# Patient Record
Sex: Male | Born: 1975 | Race: White | Hispanic: No | Marital: Single | Smoking: Former smoker
Health system: Southern US, Community
[De-identification: ages and names within clinical notes are randomized; demographics above are authoritative.]

## PROBLEM LIST (undated history)

## (undated) DIAGNOSIS — C169 Malignant neoplasm of stomach, unspecified: Secondary | ICD-10-CM

---

## 1998-12-06 ENCOUNTER — Emergency Department (HOSPITAL_COMMUNITY): Admission: EM | Admit: 1998-12-06 | Discharge: 1998-12-06 | Payer: Self-pay | Admitting: Emergency Medicine

## 1998-12-06 ENCOUNTER — Encounter: Payer: Self-pay | Admitting: Emergency Medicine

## 1999-04-06 ENCOUNTER — Emergency Department (HOSPITAL_COMMUNITY): Admission: EM | Admit: 1999-04-06 | Discharge: 1999-04-06 | Payer: Self-pay | Admitting: Emergency Medicine

## 2003-05-23 ENCOUNTER — Emergency Department (HOSPITAL_COMMUNITY): Admission: AD | Admit: 2003-05-23 | Discharge: 2003-05-23 | Payer: Self-pay | Admitting: Family Medicine

## 2004-12-21 ENCOUNTER — Emergency Department (HOSPITAL_COMMUNITY): Admission: EM | Admit: 2004-12-21 | Discharge: 2004-12-21 | Payer: Self-pay | Admitting: Emergency Medicine

## 2005-01-25 ENCOUNTER — Emergency Department (HOSPITAL_COMMUNITY): Admission: EM | Admit: 2005-01-25 | Discharge: 2005-01-25 | Payer: Self-pay | Admitting: Family Medicine

## 2005-04-06 ENCOUNTER — Emergency Department (HOSPITAL_COMMUNITY): Admission: EM | Admit: 2005-04-06 | Discharge: 2005-04-06 | Payer: Self-pay | Admitting: Emergency Medicine

## 2005-04-28 ENCOUNTER — Emergency Department (HOSPITAL_COMMUNITY): Admission: EM | Admit: 2005-04-28 | Discharge: 2005-04-28 | Payer: Self-pay | Admitting: Family Medicine

## 2005-11-30 ENCOUNTER — Emergency Department (HOSPITAL_COMMUNITY): Admission: EM | Admit: 2005-11-30 | Discharge: 2005-11-30 | Payer: Self-pay | Admitting: Emergency Medicine

## 2008-12-16 ENCOUNTER — Ambulatory Visit: Payer: Self-pay | Admitting: Family Medicine

## 2010-02-14 ENCOUNTER — Emergency Department (HOSPITAL_COMMUNITY): Admission: EM | Admit: 2010-02-14 | Discharge: 2010-02-14 | Payer: Self-pay | Admitting: Emergency Medicine

## 2010-04-02 ENCOUNTER — Emergency Department (HOSPITAL_COMMUNITY): Admission: EM | Admit: 2010-04-02 | Discharge: 2010-04-02 | Payer: Self-pay | Admitting: Family Medicine

## 2010-08-05 ENCOUNTER — Inpatient Hospital Stay (INDEPENDENT_AMBULATORY_CARE_PROVIDER_SITE_OTHER)
Admission: RE | Admit: 2010-08-05 | Discharge: 2010-08-05 | Disposition: A | Discharge status: Home / Self Care | Payer: Self-pay | Source: Ambulatory Visit | Attending: Family Medicine | Admitting: Family Medicine

## 2010-08-05 DIAGNOSIS — J45909 Unspecified asthma, uncomplicated: Secondary | ICD-10-CM

## 2010-09-26 ENCOUNTER — Inpatient Hospital Stay (INDEPENDENT_AMBULATORY_CARE_PROVIDER_SITE_OTHER)
Admission: RE | Admit: 2010-09-26 | Discharge: 2010-09-26 | Disposition: A | Discharge status: Home / Self Care | Payer: Self-pay | Source: Ambulatory Visit | Attending: Family Medicine | Admitting: Family Medicine

## 2010-09-26 DIAGNOSIS — J309 Allergic rhinitis, unspecified: Secondary | ICD-10-CM

## 2010-09-26 LAB — POCT RAPID STREP A (OFFICE): Streptococcus, Group A Screen (Direct): NEGATIVE

## 2011-03-01 ENCOUNTER — Emergency Department (HOSPITAL_COMMUNITY)
Admission: EM | Admit: 2011-03-01 | Discharge: 2011-03-01 | Disposition: A | Discharge status: Home / Self Care | Payer: Self-pay | Attending: Emergency Medicine | Admitting: Emergency Medicine

## 2011-03-01 DIAGNOSIS — R05 Cough: Secondary | ICD-10-CM | POA: Insufficient documentation

## 2011-03-01 DIAGNOSIS — R0602 Shortness of breath: Secondary | ICD-10-CM | POA: Insufficient documentation

## 2011-03-01 DIAGNOSIS — R059 Cough, unspecified: Secondary | ICD-10-CM | POA: Insufficient documentation

## 2011-03-01 DIAGNOSIS — F17200 Nicotine dependence, unspecified, uncomplicated: Secondary | ICD-10-CM | POA: Insufficient documentation

## 2011-03-01 DIAGNOSIS — J45909 Unspecified asthma, uncomplicated: Secondary | ICD-10-CM | POA: Insufficient documentation

## 2011-06-25 ENCOUNTER — Emergency Department (INDEPENDENT_AMBULATORY_CARE_PROVIDER_SITE_OTHER)
Admission: EM | Admit: 2011-06-25 | Discharge: 2011-06-25 | Disposition: A | Discharge status: Home / Self Care | Payer: Self-pay | Source: Home / Self Care | Attending: Family Medicine | Admitting: Family Medicine

## 2011-06-25 ENCOUNTER — Encounter: Payer: Self-pay | Admitting: Emergency Medicine

## 2011-06-25 DIAGNOSIS — J45901 Unspecified asthma with (acute) exacerbation: Secondary | ICD-10-CM

## 2011-06-25 MED ORDER — PREDNISONE 20 MG PO TABS: 40.0000 mg | ORAL_TABLET | Freq: Every day | ORAL | Status: AC

## 2011-06-25 MED ORDER — ALBUTEROL SULFATE (5 MG/ML) 0.5% IN NEBU
INHALATION_SOLUTION | RESPIRATORY_TRACT | Status: AC
  Filled 2011-06-25: qty 1

## 2011-06-25 MED ORDER — IPRATROPIUM BROMIDE 0.02 % IN SOLN
0.5000 mg | Freq: Once | RESPIRATORY_TRACT | Status: AC
  Administered 2011-06-25: 0.5 mg via RESPIRATORY_TRACT

## 2011-06-25 MED ORDER — ALBUTEROL SULFATE (5 MG/ML) 0.5% IN NEBU
2.5000 mg | INHALATION_SOLUTION | Freq: Once | RESPIRATORY_TRACT | Status: AC
  Administered 2011-06-25: 2.5 mg via RESPIRATORY_TRACT

## 2011-06-25 MED ORDER — PREDNISONE 20 MG PO TABS
ORAL_TABLET | ORAL | Status: AC
  Filled 2011-06-25: qty 2

## 2011-06-25 MED ORDER — ALBUTEROL SULFATE HFA 108 (90 BASE) MCG/ACT IN AERS
2.0000 | INHALATION_SPRAY | RESPIRATORY_TRACT | Status: DC | PRN
  Administered 2011-06-25 (×2): 2 via RESPIRATORY_TRACT

## 2011-06-25 MED ORDER — PREDNISONE 20 MG PO TABS
40.0000 mg | ORAL_TABLET | Freq: Once | ORAL | Status: AC
  Administered 2011-06-25: 40 mg via ORAL

## 2011-06-25 MED ORDER — ALBUTEROL SULFATE (5 MG/ML) 0.5% IN NEBU
INHALATION_SOLUTION | RESPIRATORY_TRACT | Status: AC
  Filled 2011-06-25: qty 0.5

## 2011-06-25 NOTE — ED Provider Notes (Signed)
History     CSN: [id number]  Arrival date & time 06/25/11  6213   First MD Initiated Contact with Patient 06/25/11 1033      Chief Complaint  Patient presents with  . Asthma    (Consider location/radiation/quality/duration/timing/severity/associated sxs/prior treatment) HPI Comments: Jesus Haynes presents for evaluation of shortness of breath and wheezing since last night. He reports a hx of asthma but his inhaler was empty. He also reports a recent sinus infection and congestion. He denies any fever. He reports non-productive cough. He also reports that their recent house, from which they moved, had a mold problem.   Patient is a 35 y.o. male presenting with shortness of breath. The history is provided by the patient.  Shortness of Breath  The current episode started yesterday. The onset was sudden. The problem has been unchanged. The problem is moderate. The symptoms are relieved by nothing. The symptoms are aggravated by nothing. Associated symptoms include rhinorrhea, cough, shortness of breath and wheezing. Pertinent negatives include no fever. His past medical history is significant for asthma.    Past Medical History  Diagnosis Date  . Asthma     History reviewed. No pertinent past surgical history.  No family history on file.  History  Substance Use Topics  . Smoking status: Current Everyday Smoker  . Smokeless tobacco: Not on file  . Alcohol Use: No      Review of Systems  Constitutional: Negative for fever and chills.  HENT: Positive for congestion and rhinorrhea.   Eyes: Negative.   Respiratory: Positive for cough, shortness of breath and wheezing.   Gastrointestinal: Negative.   Genitourinary: Negative.   Musculoskeletal: Negative.   Skin: Negative.   Neurological: Negative.     Allergies  Review of patient's allergies indicates no known allergies.  Home Medications   Current Outpatient Rx  Name Route Sig Dispense Refill  . ALBUTEROL SULFATE HFA 108  (90 BASE) MCG/ACT IN AERS Inhalation Inhale 2 puffs into the lungs every 6 (six) hours as needed.      Marland Kitchen PREDNISONE 20 MG PO TABS Oral Take 2 tablets (40 mg total) by mouth daily. 10 tablet 0    BP 128/69  Pulse 90  Temp(Src) 98.3 F (36.8 C) (Oral)  Resp 26  SpO2 92%  Physical Exam  Nursing note and vitals reviewed. Constitutional: He is oriented to person, place, and time. He appears well-developed and well-nourished.  HENT:  Head: Normocephalic and atraumatic.  Eyes: EOM are normal.  Neck: Normal range of motion.  Pulmonary/Chest: Effort normal. He has wheezes in the right upper field, the right middle field, the right lower field, the left upper field, the left middle field and the left lower field.  Musculoskeletal: Normal range of motion.  Neurological: He is alert and oriented to person, place, and time.  Skin: Skin is warm and dry.  Psychiatric: His behavior is normal.    ED Course  Procedures (including critical care time)  Labs Reviewed - No data to display No results found.   1. Asthma exacerbation       MDM  Improved breathing with decreased wheezing and cough after administration of 2 duonebs; also given prednisone 40 mg PO        Richardo Priest, MD 06/25/11 1134

## 2011-06-25 NOTE — ED Notes (Signed)
PT BROUGHT BACK TO ROOM  7 FOR SUDDEN ASTHMA ATTACK THAT STARTED LAST NIGHT WITH SINUS INFECTION.PT USED ALBUTEROL INHALER BUT IT WAS CLOSE TO BEING EMPTY.PT ALSO REPORT OF LINVING IN HOUSE WITH MOLD IN WHICH THEY MOVED OUT FOR BUT [facility] LIVED THERE PRIOR 2 YRS.WHEEZING THROUGHOUT,SOB WITH SPEAKING AND RESP RATE 36.TREATMENT GIVEN PER PROTOCOL.NO DISTRESS AT THIS TIME

## 2011-07-01 NOTE — ED Notes (Signed)
Pt here requesting albuterol inhaler prescription was given inhaler to take home with him was told would be given a prescription - per dr Lorenz Coaster prescription called to [facility] on [address] - albuterol inhaler 2 puffs every 4 hours asneeded 0 refills

## 2011-09-13 ENCOUNTER — Emergency Department (HOSPITAL_COMMUNITY)
Admission: EM | Admit: 2011-09-13 | Discharge: 2011-09-13 | Disposition: A | Discharge status: Home / Self Care | Payer: Self-pay | Source: Home / Self Care

## 2011-09-13 ENCOUNTER — Encounter (HOSPITAL_COMMUNITY): Payer: Self-pay | Admitting: *Deleted

## 2011-09-13 DIAGNOSIS — L309 Dermatitis, unspecified: Secondary | ICD-10-CM

## 2011-09-13 DIAGNOSIS — L259 Unspecified contact dermatitis, unspecified cause: Secondary | ICD-10-CM

## 2011-09-13 DIAGNOSIS — J45909 Unspecified asthma, uncomplicated: Secondary | ICD-10-CM

## 2011-09-13 MED ORDER — ALBUTEROL SULFATE HFA 108 (90 BASE) MCG/ACT IN AERS: 2.0000 | INHALATION_SPRAY | RESPIRATORY_TRACT | Status: DC | PRN

## 2011-09-13 MED ORDER — IPRATROPIUM BROMIDE 0.02 % IN SOLN
0.5000 mg | Freq: Once | RESPIRATORY_TRACT | Status: AC
  Administered 2011-09-13: 0.5 mg via RESPIRATORY_TRACT

## 2011-09-13 MED ORDER — ALBUTEROL SULFATE (5 MG/ML) 0.5% IN NEBU
INHALATION_SOLUTION | RESPIRATORY_TRACT | Status: AC
  Filled 2011-09-13: qty 1

## 2011-09-13 MED ORDER — TRIAMCINOLONE ACETONIDE 0.1 % EX CREA: TOPICAL_CREAM | Freq: Two times a day (BID) | CUTANEOUS | Status: AC

## 2011-09-13 MED ORDER — ALBUTEROL SULFATE (5 MG/ML) 0.5% IN NEBU
5.0000 mg | INHALATION_SOLUTION | Freq: Once | RESPIRATORY_TRACT | Status: AC
  Administered 2011-09-13: 5 mg via RESPIRATORY_TRACT

## 2011-09-13 NOTE — Discharge Instructions (Signed)
Stop smoking! I have provided you with the contact information for the [facility] and the [facility]. They accept Medicaid if they are accepting new patients. Return as needed.

## 2011-09-13 NOTE — ED Notes (Signed)
Pt  Reports    He  Has  Asthma    Out of  meds   No  pcp   Continues  To  Smoke          Reports  Cough  Congested  And        Short of  Breath       Sitting  Upright on  Exam table  Speaking in  Complete  sentances

## 2011-09-13 NOTE — ED Provider Notes (Signed)
History     CSN: [id number]  Arrival date & time 09/13/11  1038   None     Chief Complaint  Patient presents with  . Asthma    (Consider location/radiation/quality/duration/timing/severity/associated sxs/prior treatment) HPI Comments: Patient presents today with complaints of asthma. He states he has a history of asthma since childhood. It did not bother him for many years of his adult life but he has been symptomatic for the last 1-1/2 years. He has had intermittent wheezing and cough for the last year and a half, no change or worsening. He states his symptoms are worse in the morning when he first awakens. His cough is sometimes productive and his symptoms sometimes improve once he clears the phlegm out of his chest and throat. He is a smoker. He denies fever, chills, nasal congestion, sore throat or sinus pressure. He also has a history of eczema. He has used triamcinolone cream in the past requesting refill of this also. Patient states that he recently obtained Medicaid insurance and has been unable to find a primary care provider who accepts this insurance.   Past Medical History  Diagnosis Date  . Asthma     History reviewed. No pertinent past surgical history.  History reviewed. No pertinent family history.  History  Substance Use Topics  . Smoking status: Current Everyday Smoker  . Smokeless tobacco: Not on file  . Alcohol Use: No      Review of Systems  Constitutional: Negative for fever and chills.  HENT: Negative for ear pain, congestion, sore throat, rhinorrhea and sinus pressure.   Respiratory: Positive for cough, shortness of breath and wheezing.   Cardiovascular: Negative for chest pain.  Skin: Positive for rash.    Allergies  Review of patient's allergies indicates no known allergies.  Home Medications   Current Outpatient Rx  Name Route Sig Dispense Refill  . ALBUTEROL SULFATE HFA 108 (90 BASE) MCG/ACT IN AERS Inhalation Inhale 2 puffs into the  lungs every 4 (four) hours as needed. 1 Inhaler 0  . TRIAMCINOLONE ACETONIDE 0.1 % EX CREA Topical Apply topically 2 (two) times daily. 15 g 0    BP 122/77  Pulse 85  Temp(Src) 98.2 F (36.8 C) (Oral)  Resp 16  SpO2 96%  Physical Exam  Nursing note and vitals reviewed. Constitutional: He appears well-developed and well-nourished. No distress.  HENT:  Head: Normocephalic and atraumatic.  Right Ear: Tympanic membrane, external ear and ear canal normal.  Left Ear: Tympanic membrane, external ear and ear canal normal.  Nose: Nose normal.  Mouth/Throat: Uvula is midline, oropharynx is clear and moist and mucous membranes are normal. No oropharyngeal exudate, posterior oropharyngeal edema or posterior oropharyngeal erythema.  Neck: Neck supple.  Cardiovascular: Normal rate, regular rhythm and normal heart sounds.   Pulmonary/Chest: Effort normal and breath sounds normal. No respiratory distress.  Lymphadenopathy:    He has no cervical adenopathy.  Neurological: He is alert.  Skin: Skin is warm and dry. Rash (scaling patches noted bilateral hands) noted.  Psychiatric: He has a normal mood and affect.    ED Course  Procedures (including critical care time)  Labs Reviewed - No data to display No results found.   1. Asthma   2. Eczema       MDM          Melody Comas, PA 09/13/11 1400

## 2011-09-14 NOTE — ED Provider Notes (Signed)
Medical screening examination/treatment/procedure(s) were performed by non-physician practitioner and as supervising physician I was immediately available for consultation/collaboration.  Bijan Ridgley   Jakyria Bleau, MD 09/14/11 0750 

## 2013-01-21 ENCOUNTER — Emergency Department (INDEPENDENT_AMBULATORY_CARE_PROVIDER_SITE_OTHER)
Admission: EM | Admit: 2013-01-21 | Discharge: 2013-01-21 | Disposition: A | Discharge status: Home / Self Care | Payer: Self-pay | Source: Home / Self Care | Attending: Emergency Medicine | Admitting: Emergency Medicine

## 2013-01-21 ENCOUNTER — Encounter (HOSPITAL_COMMUNITY): Payer: Self-pay

## 2013-01-21 DIAGNOSIS — H109 Unspecified conjunctivitis: Secondary | ICD-10-CM

## 2013-01-21 DIAGNOSIS — S0501XA Injury of conjunctiva and corneal abrasion without foreign body, right eye, initial encounter: Secondary | ICD-10-CM

## 2013-01-21 DIAGNOSIS — S058X9A Other injuries of unspecified eye and orbit, initial encounter: Secondary | ICD-10-CM

## 2013-01-21 MED ORDER — TETRACAINE HCL 0.5 % OP SOLN
OPHTHALMIC | Status: AC
  Filled 2013-01-21: qty 2

## 2013-01-21 MED ORDER — POLYMYXIN B-TRIMETHOPRIM 10000-0.1 UNIT/ML-% OP SOLN: 1.0000 [drp] | OPHTHALMIC | Status: DC

## 2013-01-21 MED ORDER — TETRACAINE HCL 0.5 % OP SOLN
1.0000 [drp] | Freq: Once | OPHTHALMIC | Status: AC
  Administered 2013-01-21: 1 [drp] via OPHTHALMIC

## 2013-01-21 NOTE — ED Provider Notes (Signed)
   History    CSN: [id number] Arrival date & time 01/21/13  4098  None    Chief Complaint  Patient presents with  . Eye Problem   (Consider location/radiation/quality/duration/timing/severity/associated sxs/prior Treatment) HPI Comments: 37 year old male who states that 4 days ago his eyes began to feel irritated. He had been working in a dusty environment. In the ensuing 4 days his eyes became red with swelling of the upper and lower lids, puffiness and drainage. Gradual onset.  Past Medical History  Diagnosis Date  . Asthma    History reviewed. No pertinent past surgical history. History reviewed. No pertinent family history. History  Substance Use Topics  . Smoking status: Current Every Day Smoker  . Smokeless tobacco: Not on file  . Alcohol Use: No    Review of Systems  Constitutional: Negative.   HENT: Negative.   Eyes: Positive for discharge, redness and itching.  Respiratory: Negative.   Genitourinary: Negative.   Neurological: Negative.     Allergies  Review of patient's allergies indicates no known allergies.  Home Medications   Current Outpatient Rx  Name  Route  Sig  Dispense  Refill  . albuterol (PROVENTIL HFA;VENTOLIN HFA) 108 (90 BASE) MCG/ACT inhaler   Inhalation   Inhale 2 puffs into the lungs every 4 (four) hours as needed.   1 Inhaler   0   . trimethoprim-polymyxin b (POLYTRIM) ophthalmic solution   Both Eyes   Place 1 drop into both eyes every 4 (four) hours.   10 mL   0    BP 134/82  Pulse 71  Temp(Src) 97.8 F (36.6 C) (Oral)  Resp 17  SpO2 98% Physical Exam  Nursing note and vitals reviewed. Constitutional: He is oriented to person, place, and time. He appears well-developed and well-nourished. No distress.  HENT:  Mouth/Throat: Oropharynx is clear and moist.  Eyes: EOM are normal. Pupils are equal, round, and reactive to light.  Bilateral conjunctival erythema upper and lower lids. Mild puffiness of the lower lids. The  bilateral sclera injected. Date mucoid stringy discharge from each eye.  Neck: Normal range of motion. Neck supple.  Musculoskeletal: He exhibits no edema.  Neurological: He is alert and oriented to person, place, and time. He exhibits normal muscle tone.  Skin: Skin is warm and dry.  Psychiatric: He has a normal mood and affect.    ED Course  Irrigation Date/Time: 01/21/2013 10:50 AM Performed by: Phineas Real Rasheen Schewe Authorized by: Phineas Real, Deberah Adolf Consent: Verbal consent obtained. Risks and benefits: risks, benefits and alternatives were discussed Consent given by: patient Patient understanding: patient states understanding of the procedure being performed Patient identity confirmed: verbally with patient Local anesthesia used: yes Anesthesia: local infiltration Local anesthetic: topical anesthetic Anesthetic total: 0.3 ml Patient sedated: no Comments: Bilateral eyes irrigated with eye wash post fluro stain, Small speck debris removed.    (including critical care time) Labs Reviewed - No data to display No results found. 1. Corneal abrasion, bilateral, initial encounter   2. Conjunctivitis of both eyes     MDM  Polytrim as directed Stay out of dusty type environments for 3-4 days, Wear eye protection. F/U with the above on call eye Doc  Hayden Rasmussen, NP 01/21/13 1303

## 2013-01-21 NOTE — ED Provider Notes (Signed)
Medical screening examination/treatment/procedure(s) were performed by non-physician practitioner and as supervising physician I was immediately available for consultation/collaboration.  Leslee Home, M.D.   Reuben Likes, MD 01/21/13 [phone]

## 2013-01-21 NOTE — ED Notes (Signed)
C/o both eyes irritated, R>L x 2 days; NAD

## 2013-07-28 ENCOUNTER — Emergency Department (INDEPENDENT_AMBULATORY_CARE_PROVIDER_SITE_OTHER): Payer: Self-pay

## 2013-07-28 ENCOUNTER — Encounter (HOSPITAL_COMMUNITY): Payer: Self-pay | Admitting: Emergency Medicine

## 2013-07-28 ENCOUNTER — Emergency Department (INDEPENDENT_AMBULATORY_CARE_PROVIDER_SITE_OTHER)
Admission: EM | Admit: 2013-07-28 | Discharge: 2013-07-28 | Disposition: A | Discharge status: Home / Self Care | Payer: Self-pay | Source: Home / Self Care | Attending: Family Medicine | Admitting: Family Medicine

## 2013-07-28 DIAGNOSIS — J45909 Unspecified asthma, uncomplicated: Secondary | ICD-10-CM

## 2013-07-28 MED ORDER — IPRATROPIUM BROMIDE 0.02 % IN SOLN
0.5000 mg | Freq: Once | RESPIRATORY_TRACT | Status: AC
  Administered 2013-07-28: 0.5 mg via RESPIRATORY_TRACT

## 2013-07-28 MED ORDER — METHYLPREDNISOLONE 4 MG PO KIT: PACK | ORAL | Status: DC

## 2013-07-28 MED ORDER — LEVOFLOXACIN 500 MG PO TABS: 500.0000 mg | ORAL_TABLET | Freq: Every day | ORAL | Status: DC

## 2013-07-28 MED ORDER — METHYLPREDNISOLONE SODIUM SUCC 125 MG IJ SOLR
INTRAMUSCULAR | Status: AC
  Filled 2013-07-28: qty 2

## 2013-07-28 MED ORDER — METHYLPREDNISOLONE SODIUM SUCC 125 MG IJ SOLR
125.0000 mg | Freq: Once | INTRAMUSCULAR | Status: AC
  Administered 2013-07-28: 125 mg via INTRAMUSCULAR

## 2013-07-28 MED ORDER — ALBUTEROL SULFATE HFA 108 (90 BASE) MCG/ACT IN AERS: 1.0000 | INHALATION_SPRAY | Freq: Four times a day (QID) | RESPIRATORY_TRACT | Status: DC | PRN

## 2013-07-28 MED ORDER — ALBUTEROL SULFATE (2.5 MG/3ML) 0.083% IN NEBU
5.0000 mg | INHALATION_SOLUTION | Freq: Once | RESPIRATORY_TRACT | Status: AC
  Administered 2013-07-28: 5 mg via RESPIRATORY_TRACT

## 2013-07-28 MED ORDER — IPRATROPIUM BROMIDE 0.02 % IN SOLN
RESPIRATORY_TRACT | Status: AC
  Filled 2013-07-28: qty 2.5

## 2013-07-28 MED ORDER — ALBUTEROL SULFATE (2.5 MG/3ML) 0.083% IN NEBU
INHALATION_SOLUTION | RESPIRATORY_TRACT | Status: AC
  Filled 2013-07-28: qty 6

## 2013-07-28 NOTE — ED Notes (Signed)
Pt c/o cold sxs onset 2 weeks Sxs include: wheezing, SOB, scratchy throat, congestion Hx of asthma... Reports he quit smoking about 7 months ago Denies f/v/n/d He is alert w/no signs of acute distress.

## 2013-07-28 NOTE — ED Provider Notes (Signed)
CSN: [id number]     Arrival date & time 07/28/13  1011 History   First MD Initiated Contact with Patient 07/28/13 1043     Chief Complaint  Patient presents with  . URI   (Consider location/radiation/quality/duration/timing/severity/associated sxs/prior Treatment) Patient is a 38 y.o. male presenting with URI. The history is provided by the patient.  URI Presenting symptoms: congestion, cough and rhinorrhea   Presenting symptoms: no fever   Severity:  Moderate Onset quality:  Gradual Duration:  2 weeks Chronicity:  New Ineffective treatments:  Inhaler Associated symptoms: wheezing   Risk factors comment:  Quit smoking 7 mo ago.   Past Medical History  Diagnosis Date  . Asthma    History reviewed. No pertinent past surgical history. No family history on file. History  Substance Use Topics  . Smoking status: Former Smoker -- 0.50 packs/day    Types: Cigarettes    Quit date: 12/26/2012  . Smokeless tobacco: Not on file  . Alcohol Use: No    Review of Systems  Constitutional: Negative.  Negative for fever.  HENT: Positive for congestion and rhinorrhea.   Respiratory: Positive for cough and wheezing.   Gastrointestinal: Negative.     Allergies  Review of patient's allergies indicates no known allergies.  Home Medications   Current Outpatient Rx  Name  Route  Sig  Dispense  Refill  . albuterol (PROVENTIL HFA;VENTOLIN HFA) 108 (90 BASE) MCG/ACT inhaler   Inhalation   Inhale 2 puffs into the lungs every 4 (four) hours as needed.   1 Inhaler   0   . albuterol (PROVENTIL HFA;VENTOLIN HFA) 108 (90 BASE) MCG/ACT inhaler   Inhalation   Inhale 1-2 puffs into the lungs every 6 (six) hours as needed for wheezing or shortness of breath.   1 Inhaler   0   . levofloxacin (LEVAQUIN) 500 MG tablet   Oral   Take 1 tablet (500 mg total) by mouth daily.   7 tablet   0   . methylPREDNISolone (MEDROL DOSEPAK) 4 MG tablet      follow package directions   21 tablet   0    . trimethoprim-polymyxin b (POLYTRIM) ophthalmic solution   Both Eyes   Place 1 drop into both eyes every 4 (four) hours.   10 mL   0    BP 134/73  Pulse 80  Temp(Src) 98 F (36.7 C) (Oral)  Resp 14  SpO2 98% Physical Exam  Nursing note and vitals reviewed. Constitutional: He is oriented to person, place, and time. He appears well-developed and well-nourished.  HENT:  Head: Normocephalic.  Right Ear: External ear normal.  Left Ear: External ear normal.  Mouth/Throat: Oropharynx is clear and moist.  Eyes: Pupils are equal, round, and reactive to light.  Neck: Normal range of motion. Neck supple.  Cardiovascular: Normal rate, regular rhythm, normal heart sounds and intact distal pulses.   Pulmonary/Chest: Effort normal. He has wheezes. He has rhonchi.  Abdominal: Soft. Bowel sounds are normal.  Lymphadenopathy:    He has no cervical adenopathy.  Neurological: He is alert and oriented to person, place, and time.  Skin: Skin is warm and dry.    ED Course  Procedures (including critical care time) Labs Review Labs Reviewed - No data to display Imaging Review No results found.    MDM  X-rays reviewed and report per radiologist.     Linna HoffJames D Audriana Aldama, MD 07/30/13 2035

## 2013-07-28 NOTE — Discharge Instructions (Signed)
Take all of medicine, drink lots of fluids, no more smoking, see your doctor if further problems  °

## 2014-06-21 ENCOUNTER — Emergency Department (HOSPITAL_COMMUNITY)
Admission: EM | Admit: 2014-06-21 | Discharge: 2014-06-21 | Disposition: A | Discharge status: Home / Self Care | Payer: Medicaid Other | Attending: Emergency Medicine | Admitting: Emergency Medicine

## 2014-06-21 ENCOUNTER — Encounter (HOSPITAL_COMMUNITY): Payer: Self-pay | Admitting: *Deleted

## 2014-06-21 ENCOUNTER — Emergency Department (HOSPITAL_COMMUNITY): Payer: Medicaid Other

## 2014-06-21 DIAGNOSIS — J069 Acute upper respiratory infection, unspecified: Secondary | ICD-10-CM | POA: Diagnosis not present

## 2014-06-21 DIAGNOSIS — R05 Cough: Secondary | ICD-10-CM

## 2014-06-21 DIAGNOSIS — R059 Cough, unspecified: Secondary | ICD-10-CM

## 2014-06-21 DIAGNOSIS — Z87891 Personal history of nicotine dependence: Secondary | ICD-10-CM | POA: Diagnosis not present

## 2014-06-21 DIAGNOSIS — Z792 Long term (current) use of antibiotics: Secondary | ICD-10-CM | POA: Diagnosis not present

## 2014-06-21 DIAGNOSIS — Z79899 Other long term (current) drug therapy: Secondary | ICD-10-CM | POA: Insufficient documentation

## 2014-06-21 DIAGNOSIS — J45901 Unspecified asthma with (acute) exacerbation: Secondary | ICD-10-CM | POA: Insufficient documentation

## 2014-06-21 MED ORDER — PREDNISONE 20 MG PO TABS
60.0000 mg | ORAL_TABLET | Freq: Once | ORAL | Status: AC
  Administered 2014-06-21: 60 mg via ORAL
  Filled 2014-06-21: qty 3

## 2014-06-21 MED ORDER — ALBUTEROL SULFATE HFA 108 (90 BASE) MCG/ACT IN AERS
2.0000 | INHALATION_SPRAY | Freq: Once | RESPIRATORY_TRACT | Status: AC
Start: 2014-06-21 — End: 2014-06-21
  Administered 2014-06-21: 2 via RESPIRATORY_TRACT
  Filled 2014-06-21: qty 6.7

## 2014-06-21 MED ORDER — PREDNISONE 20 MG PO TABS: 40.0000 mg | ORAL_TABLET | Freq: Every day | ORAL | Status: DC

## 2014-06-21 MED ORDER — HYDROCODONE-HOMATROPINE 5-1.5 MG/5ML PO SYRP: 5.0000 mL | ORAL_SOLUTION | Freq: Four times a day (QID) | ORAL | Status: DC | PRN

## 2014-06-21 NOTE — Discharge Instructions (Signed)
Take Prednisone as directed until gone. Take hycodan as needed for cough. Refer to attached documents for more information. Return to the ED with worsening or concerning symptoms.

## 2014-06-21 NOTE — ED Provider Notes (Signed)
CSN: [id number]     Arrival date & time 06/21/14  1016 History   This chart was scribed for non-physician practitioner, Emilia BeckKaitlyn Estella Malatesta, PA-C, working with [name] PayorPickering, MD by Freida Busmaniana Omoyeni, ED Scribe. This patient was seen in room TR05C/TR05C and the patient's care was started at 11:50 AM.    Chief Complaint  Patient presents with  . Cough  . URI     Patient is a 38 y.o. male presenting with cough. The history is provided by the patient. No language interpreter was used.  Cough Cough characteristics:  Dry Duration:  1 week Timing:  Constant Progression:  Unchanged Chronicity:  New Ineffective treatments:  Steroid inhaler Associated symptoms: no chills, no fever and no rhinorrhea     HPI Comments:  Jesus Haynes is a 38 y.o. male with a h/o asthma as a child  who presents to the Emergency Department complaining of dry cough for about 1 week with associated chest congestion. Pt reports using an inhaler without relief. He denies fever.    Past Medical History  Diagnosis Date  . Asthma    History reviewed. No pertinent past surgical history. History reviewed. No pertinent family history. History  Substance Use Topics  . Smoking status: Former Smoker -- 0.50 packs/day    Types: Cigarettes    Quit date: 12/26/2012  . Smokeless tobacco: Not on file  . Alcohol Use: No    Review of Systems  Constitutional: Negative for fever and chills.  HENT: Positive for congestion. Negative for rhinorrhea.   Respiratory: Positive for cough.   All other systems reviewed and are negative.     Allergies  Review of patient's allergies indicates no known allergies.  Home Medications   Prior to Admission medications   Medication Sig Start Date End Date Taking? Authorizing Provider  GuaiFENesin (MUCINEX PO) Take by mouth as needed (cough).   Yes Historical Provider, MD  ibuprofen (ADVIL,MOTRIN) 200 MG tablet Take 200 mg by mouth every 6 (six) hours as needed for mild pain.    Yes Historical Provider, MD  albuterol (PROVENTIL HFA;VENTOLIN HFA) 108 (90 BASE) MCG/ACT inhaler Inhale 2 puffs into the lungs every 4 (four) hours as needed. Patient not taking: Reported on 06/21/2014 09/13/11   Melody Comasawn M Sampson, PA-C  albuterol (PROVENTIL HFA;VENTOLIN HFA) 108 (90 BASE) MCG/ACT inhaler Inhale 1-2 puffs into the lungs every 6 (six) hours as needed for wheezing or shortness of breath. Patient not taking: Reported on 06/21/2014 07/28/13   Linna HoffJames D Kindl, MD  levofloxacin (LEVAQUIN) 500 MG tablet Take 1 tablet (500 mg total) by mouth daily. Patient not taking: Reported on 06/21/2014 07/28/13   Linna HoffJames D Kindl, MD  methylPREDNISolone (MEDROL DOSEPAK) 4 MG tablet follow package directions Patient not taking: Reported on 06/21/2014 07/28/13   Linna HoffJames D Kindl, MD  trimethoprim-polymyxin b (POLYTRIM) ophthalmic solution Place 1 drop into both eyes every 4 (four) hours. Patient not taking: Reported on 06/21/2014 01/21/13   Hayden Rasmussenavid Mabe, NP   BP 159/88 mmHg  Pulse 74  Temp(Src) 97.7 F (36.5 C) (Oral)  Resp 18  Ht 5\' 7"  (1.702 m)  Wt 175 lb (79.379 kg)  BMI 27.40 kg/m2  SpO2 96% Physical Exam  Constitutional: He is oriented to person, place, and time. He appears well-developed and well-nourished.  HENT:  Head: Normocephalic and atraumatic.  Mouth/Throat: Oropharynx is clear and moist. No oropharyngeal exudate.  Eyes: Conjunctivae and EOM are normal.  Neck: Normal range of motion.  Cardiovascular: Normal rate, regular rhythm  and normal heart sounds.   Pulmonary/Chest: Effort normal.  Expiratory wheezing noted in all lung fields   Abdominal: He exhibits no distension.  Musculoskeletal: Normal range of motion.  Lymphadenopathy:    He has no cervical adenopathy.  Neurological: He is alert and oriented to person, place, and time.  Skin: Skin is warm and dry.  Psychiatric: He has a normal mood and affect.  Nursing note and vitals reviewed.   ED Course  Procedures   DIAGNOSTIC  STUDIES:  Oxygen Saturation is 96% on RA, normal by my interpretation.    COORDINATION OF CARE:  11:52 AM Discussed treatment plan with pt at bedside and pt agreed to plan.  Labs Review Labs Reviewed - No data to display  Imaging Review Dg Chest 2 View  06/21/2014   CLINICAL DATA:  Cough and shortness of breath.  Sinus pressure.  EXAM: CHEST  2 VIEW  COMPARISON:  07/28/2013  FINDINGS: The cardiomediastinal contours are normal. The lungs are clear. Pulmonary vasculature is normal. No consolidation, pleural effusion, or pneumothorax. No acute osseous abnormalities are seen.  IMPRESSION: No acute pulmonary process.   Electronically Signed   By: Rubye OaksMelanie  Ehinger M.D.   On: 06/21/2014 11:18     EKG Interpretation None      MDM   Final diagnoses:  Cough  URI (upper respiratory infection)    11:57 AM Patient's xray unremarkable for acute changes. Patient likely has URI. Patient will have prednisone, hycodan and albuterol inhaler. Vitals stable and patient afebrile.   I personally performed the services described in this documentation, which was scribed in my presence. The recorded information has been reviewed and is accurate.    [name], PA-C 06/21/14 1158  [name] PayorPickering, MD 06/22/14 [phone]

## 2014-06-21 NOTE — ED Notes (Signed)
Pt reports having a nonproductive cough and cold symptoms for one week. Hx of asthma as a child and does not currently have an inhaler. Felt like he had chest congestion so tried his sons inhaler with no relief. Airway intact at triage and no distress noted.

## 2014-06-29 ENCOUNTER — Encounter (HOSPITAL_COMMUNITY): Payer: Self-pay | Admitting: Emergency Medicine

## 2014-06-29 ENCOUNTER — Emergency Department (HOSPITAL_COMMUNITY): Payer: Medicaid Other

## 2014-06-29 ENCOUNTER — Emergency Department (HOSPITAL_COMMUNITY)
Admission: EM | Admit: 2014-06-29 | Discharge: 2014-06-29 | Disposition: A | Discharge status: Home / Self Care | Payer: Medicaid Other | Attending: Emergency Medicine | Admitting: Emergency Medicine

## 2014-06-29 DIAGNOSIS — R0602 Shortness of breath: Secondary | ICD-10-CM

## 2014-06-29 DIAGNOSIS — Z87891 Personal history of nicotine dependence: Secondary | ICD-10-CM | POA: Diagnosis not present

## 2014-06-29 DIAGNOSIS — Z7952 Long term (current) use of systemic steroids: Secondary | ICD-10-CM | POA: Insufficient documentation

## 2014-06-29 DIAGNOSIS — Z792 Long term (current) use of antibiotics: Secondary | ICD-10-CM | POA: Diagnosis not present

## 2014-06-29 DIAGNOSIS — J159 Unspecified bacterial pneumonia: Secondary | ICD-10-CM | POA: Diagnosis not present

## 2014-06-29 DIAGNOSIS — J45901 Unspecified asthma with (acute) exacerbation: Secondary | ICD-10-CM | POA: Diagnosis not present

## 2014-06-29 DIAGNOSIS — Z79899 Other long term (current) drug therapy: Secondary | ICD-10-CM | POA: Insufficient documentation

## 2014-06-29 DIAGNOSIS — H6123 Impacted cerumen, bilateral: Secondary | ICD-10-CM | POA: Insufficient documentation

## 2014-06-29 DIAGNOSIS — R062 Wheezing: Secondary | ICD-10-CM

## 2014-06-29 DIAGNOSIS — J189 Pneumonia, unspecified organism: Secondary | ICD-10-CM

## 2014-06-29 DIAGNOSIS — R0789 Other chest pain: Secondary | ICD-10-CM

## 2014-06-29 DIAGNOSIS — R05 Cough: Secondary | ICD-10-CM | POA: Diagnosis present

## 2014-06-29 LAB — CBC WITH DIFFERENTIAL/PLATELET
BASOS ABS: 0 10*3/uL (ref 0.0–0.1)
BASOS PCT: 0 % (ref 0–1)
Eosinophils Absolute: 0.2 10*3/uL (ref 0.0–0.7)
Eosinophils Relative: 2 % (ref 0–5)
HCT: 42.8 % (ref 39.0–52.0)
Hemoglobin: 14.1 g/dL (ref 13.0–17.0)
Lymphocytes Relative: 15 % (ref 12–46)
Lymphs Abs: 1.8 10*3/uL (ref 0.7–4.0)
MCH: 28 pg (ref 26.0–34.0)
MCHC: 32.9 g/dL (ref 30.0–36.0)
MCV: 85.1 fL (ref 78.0–100.0)
MONO ABS: 0.7 10*3/uL (ref 0.1–1.0)
Monocytes Relative: 6 % (ref 3–12)
NEUTROS ABS: 9.7 10*3/uL — AB (ref 1.7–7.7)
NEUTROS PCT: 77 % (ref 43–77)
Platelets: 273 10*3/uL (ref 150–400)
RBC: 5.03 MIL/uL (ref 4.22–5.81)
RDW: 13.5 % (ref 11.5–15.5)
WBC: 12.4 10*3/uL — ABNORMAL HIGH (ref 4.0–10.5)

## 2014-06-29 LAB — BASIC METABOLIC PANEL
ANION GAP: 5 (ref 5–15)
BUN: 10 mg/dL (ref 6–23)
CHLORIDE: 102 meq/L (ref 96–112)
CO2: 28 mmol/L (ref 19–32)
CREATININE: 0.85 mg/dL (ref 0.50–1.35)
Calcium: 8.7 mg/dL (ref 8.4–10.5)
GFR calc non Af Amer: >90 mL/min (ref >90)
Glucose, Bld: 146 mg/dL — ABNORMAL HIGH (ref 70–99)
POTASSIUM: 4.1 mmol/L (ref 3.5–5.1)
Sodium: 135 mmol/L (ref 135–145)

## 2014-06-29 MED ORDER — DEXAMETHASONE SODIUM PHOSPHATE 10 MG/ML IJ SOLN
10.0000 mg | Freq: Once | INTRAMUSCULAR | Status: AC
  Administered 2014-06-29: 10 mg via INTRAMUSCULAR
  Filled 2014-06-29: qty 1

## 2014-06-29 MED ORDER — IPRATROPIUM BROMIDE 0.02 % IN SOLN
0.5000 mg | Freq: Once | RESPIRATORY_TRACT | Status: AC
  Administered 2014-06-29: 0.5 mg via RESPIRATORY_TRACT
  Filled 2014-06-29: qty 2.5

## 2014-06-29 MED ORDER — IPRATROPIUM-ALBUTEROL 0.5-2.5 (3) MG/3ML IN SOLN
3.0000 mL | Freq: Once | RESPIRATORY_TRACT | Status: AC
  Administered 2014-06-29: 3 mL via RESPIRATORY_TRACT
  Filled 2014-06-29: qty 3

## 2014-06-29 MED ORDER — ALBUTEROL SULFATE (2.5 MG/3ML) 0.083% IN NEBU
5.0000 mg | INHALATION_SOLUTION | Freq: Once | RESPIRATORY_TRACT | Status: AC
  Administered 2014-06-29: 5 mg via RESPIRATORY_TRACT
  Filled 2014-06-29: qty 6

## 2014-06-29 MED ORDER — ALBUTEROL (5 MG/ML) CONTINUOUS INHALATION SOLN
10.0000 mg/h | INHALATION_SOLUTION | RESPIRATORY_TRACT | Status: DC
  Administered 2014-06-29: 10 mg/h via RESPIRATORY_TRACT
  Filled 2014-06-29: qty 20

## 2014-06-29 MED ORDER — GUAIFENESIN ER 600 MG PO TB12: 600.0000 mg | ORAL_TABLET | Freq: Two times a day (BID) | ORAL | Status: DC | PRN

## 2014-06-29 MED ORDER — ALBUTEROL SULFATE HFA 108 (90 BASE) MCG/ACT IN AERS: 2.0000 | INHALATION_SPRAY | RESPIRATORY_TRACT | Status: DC | PRN

## 2014-06-29 MED ORDER — ALBUTEROL SULFATE (2.5 MG/3ML) 0.083% IN NEBU
2.5000 mg | INHALATION_SOLUTION | Freq: Once | RESPIRATORY_TRACT | Status: AC
  Administered 2014-06-29: 2.5 mg via RESPIRATORY_TRACT
  Filled 2014-06-29: qty 3

## 2014-06-29 MED ORDER — LEVOFLOXACIN 750 MG PO TABS: 750.0000 mg | ORAL_TABLET | Freq: Every day | ORAL | Status: DC

## 2014-06-29 MED ORDER — LORATADINE 10 MG PO TABS: 10.0000 mg | ORAL_TABLET | Freq: Every day | ORAL | Status: DC

## 2014-06-29 NOTE — ED Notes (Signed)
Pt c/o URI sx with cough that is not improved

## 2014-06-29 NOTE — ED Notes (Signed)
Ambulated pt in hallway.  SpO2 remained 93-94%, HR between 118-126.  Pt reports no change in respiratory efforts while ambulating.

## 2014-06-29 NOTE — ED Notes (Signed)
Called respiratory to come and place continuous neb.

## 2014-06-29 NOTE — ED Provider Notes (Signed)
CSN: [id number]     Arrival date & time 06/29/14  1034 History  This chart was scribed for non-physician practitioner, Allen DerryMercedes Camprubi-Soms, PA-C working with Richardean Canalavid H Yao, MD by Greggory StallionKayla Andersen, ED scribe. This patient was seen in room TR11C/TR11C and the patient's care was started at 12:33 PM.    Chief Complaint  Patient presents with  . URI  . Cough   Patient is a 38 y.o. male presenting with URI. The history is provided by the patient. No language interpreter was used.  URI Presenting symptoms: congestion (sinus), cough and rhinorrhea   Presenting symptoms: no ear pain, no fever and no sore throat   Congestion:    Location:  Nasal   Interferes with sleep: no     Interferes with eating/drinking: no   Cough:    Cough characteristics:  Productive   Sputum characteristics:  Yellow   Severity:  Moderate   Duration:  2 weeks   Timing:  Intermittent   Progression:  Worsening   Chronicity:  New Severity:  Moderate Onset quality:  Gradual Duration:  2 weeks Progression:  Worsening Chronicity:  New Relieved by:  Nothing Worsened by:  Nothing tried Ineffective treatments:  Inhaler (prednisone, hycodan) Associated symptoms: headaches (intermittent, mild), sinus pain and wheezing   Associated symptoms: no arthralgias, no myalgias, no neck pain, no sneezing and no swollen glands   Headaches:    Severity:  Mild   Onset quality:  Gradual   Timing:  Intermittent   Progression:  Resolved   Chronicity:  Chronic Risk factors: recent illness   Risk factors: no sick contacts     HPI Comments: Dennard SchaumannShane E Lindell is a 38 y.o. male with history of asthma who presents to the Emergency Department complaining of productive cough of yellow sputum that started 2 weeks ago. Reports associated chest tightness, SOB, wheezing, nasal congestion, rhinorrhea and intermittent mild headaches. Pt was seen for the same on 06/22/14, xray was neg, and he was discharged home with prednisone, hycodan and an albuterol  inhaler. Medications have provided minimal relief. He has also taken advil with relief of headaches but no other medications tried. States his asthma is normally triggered by weather change but hasn't had major problems with it in a year. Denies fever, chills, sore throat, ear pain, ear discharge, chest pain, LE swelling, hemoptysis, back pain, abdominal pain, nausea, emesis, diarrhea, arthralgias, myalgias, rashes, weakness, paresthesias, vision changes, or numbness. Pt has not gotten a flu shot this year. No known sick contacts.  Past Medical History  Diagnosis Date  . Asthma    History reviewed. No pertinent past surgical history. History reviewed. No pertinent family history. History  Substance Use Topics  . Smoking status: Former Smoker -- 0.50 packs/day    Types: Cigarettes    Quit date: 12/26/2012  . Smokeless tobacco: Not on file  . Alcohol Use: No    Review of Systems  Constitutional: Negative for fever and chills.  HENT: Positive for congestion (sinus), rhinorrhea and sinus pressure. Negative for ear discharge, ear pain, sneezing, sore throat and trouble swallowing.   Eyes: Negative for pain, discharge and itching.  Respiratory: Positive for cough, chest tightness, shortness of breath and wheezing.   Cardiovascular: Negative for chest pain, palpitations and leg swelling.  Gastrointestinal: Negative for nausea, vomiting, abdominal pain and diarrhea.  Musculoskeletal: Negative for myalgias, arthralgias and neck pain.  Skin: Negative for rash.  Allergic/Immunologic: Positive for environmental allergies. Negative for immunocompromised state.  Neurological: Positive for headaches (intermittent,  mild). Negative for weakness, light-headedness and numbness.    10 systems reviewed and are negative for acute changes except as noted in the HPI.  Allergies  Review of patient's allergies indicates no known allergies.  Home Medications   Prior to Admission medications   Medication  Sig Start Date End Date Taking? Authorizing Provider  HYDROcodone-homatropine (HYCODAN) 5-1.5 MG/5ML syrup Take 5 mLs by mouth every 6 (six) hours as needed. Patient taking differently: Take 5 mLs by mouth every 6 (six) hours as needed for cough.  06/21/14  Yes Kaitlyn Szekalski, PA-C  ibuprofen (ADVIL,MOTRIN) 200 MG tablet Take 600 mg by mouth every 6 (six) hours as needed for mild pain.    Yes Historical Provider, MD  albuterol (PROVENTIL HFA;VENTOLIN HFA) 108 (90 BASE) MCG/ACT inhaler Inhale 2 puffs into the lungs every 4 (four) hours as needed. Patient not taking: Reported on 06/21/2014 09/13/11   Melody Comas, PA-C  albuterol (PROVENTIL HFA;VENTOLIN HFA) 108 (90 BASE) MCG/ACT inhaler Inhale 1-2 puffs into the lungs every 6 (six) hours as needed for wheezing or shortness of breath. Patient not taking: Reported on 06/21/2014 07/28/13   Linna Hoff, MD  levofloxacin (LEVAQUIN) 500 MG tablet Take 1 tablet (500 mg total) by mouth daily. Patient not taking: Reported on 06/21/2014 07/28/13   Linna Hoff, MD  methylPREDNISolone (MEDROL DOSEPAK) 4 MG tablet follow package directions Patient not taking: Reported on 06/21/2014 07/28/13   Linna Hoff, MD  predniSONE (DELTASONE) 20 MG tablet Take 2 tablets (40 mg total) by mouth daily. Patient not taking: Reported on 06/29/2014 06/21/14   Emilia Beck, PA-C  trimethoprim-polymyxin b (POLYTRIM) ophthalmic solution Place 1 drop into both eyes every 4 (four) hours. Patient not taking: Reported on 06/21/2014 01/21/13   Hayden Rasmussen, NP   BP 131/67 mmHg  Pulse 100  Temp(Src) 97.6 F (36.4 C) (Oral)  Resp 18  SpO2 98%   Physical Exam  Constitutional: He is oriented to person, place, and time. Vital signs are normal. He appears well-developed and well-nourished.  Non-toxic appearance. He appears distressed (mildly dyspneic at the end of sentences ).  Afebrile, non toxic, mildly dyspneic at the end of sentences, VSS, SpO2 96% on RA  HENT:  Head:  Normocephalic and atraumatic.  Right Ear: Hearing and external ear normal.  Left Ear: Hearing and external ear normal.  Nose: Mucosal edema and rhinorrhea (clear) present.  Mouth/Throat: Uvula is midline and mucous membranes are normal. No trismus in the jaw. No uvula swelling. Posterior oropharyngeal edema (L tonsillar hypertrophy 2+, chronic) present. No oropharyngeal exudate, posterior oropharyngeal erythema or tonsillar abscesses.  Bilateral cerumen impactions. Bilateral nasal turbinate edema and erythema with clear rhinorrhea. Posterior oropharynx without erythema, left tonsillar hypertrophy 2+ without exudates, no uvular deviation or swelling, no trismus. Pt thinks this is "normal" and chronic. No PTA noted.  Eyes: Conjunctivae and EOM are normal. Pupils are equal, round, and reactive to light. Right eye exhibits no discharge. Left eye exhibits no discharge.  Neck: Normal range of motion. Neck supple.  Cardiovascular: Normal rate, regular rhythm, normal heart sounds and intact distal pulses.  Exam reveals no gallop and no friction rub.   No murmur heard. Pulmonary/Chest: No tachypnea. He is in respiratory distress (mildly dyspneic while speaking). He has no decreased breath sounds. He has wheezes (diffuse expiratory). He has rhonchi in the right lower field, the left upper field and the left lower field. He has no rales. He exhibits tenderness. He exhibits no crepitus and  no retraction.  Diffuse expiratory wheezing throughout Rhonchi in b/l bases and LUF, with most prominent rhonchi in RL base. Becomes dyspneic during conversation, SpO2 96% on RA. Mild chest wall TTP, no crepitus or retractions  Abdominal: Soft. Normal appearance and bowel sounds are normal. He exhibits no distension. There is no tenderness. There is no rigidity, no rebound and no guarding.  Musculoskeletal: Normal range of motion.  Lymphadenopathy:    He has no cervical adenopathy.  No cervical LAD  Neurological: He is  alert and oriented to person, place, and time. He has normal strength. No sensory deficit.  Skin: Skin is warm, dry and intact. No rash noted.  Psychiatric: He has a normal mood and affect. His behavior is normal.  Nursing note and vitals reviewed.   ED Course  Procedures (including critical care time) CRITICAL CARE Performed by: Ramond Marrow   Total critical care time: 45 minutes  Critical care time was exclusive of separately billable procedures and treating other patients.  Critical care was necessary to treat or prevent imminent or life-threatening deterioration.  Critical care was time spent personally by me on the following activities: development of treatment plan with patient and/or surrogate as well as nursing, discussions with consultants, evaluation of patient's response to treatment, examination of patient, obtaining history from patient or surrogate, ordering and performing treatments and interventions, ordering and review of laboratory studies, ordering and review of radiographic studies, pulse oximetry and re-evaluation of patient's condition.   DIAGNOSTIC STUDIES: Oxygen Saturation is 96% on RA, normal by my interpretation.    COORDINATION OF CARE: 12:40 PM-Advised pt of xray results. Discussed treatment plan which includes an antibiotic, decadron, and another breathing treatment with pt at bedside and pt agreed to plan.   Labs Review Labs Reviewed  CBC WITH DIFFERENTIAL - Abnormal; Notable for the following:    WBC 12.4 (*)    Neutro Abs 9.7 (*)    All other components within normal limits  BASIC METABOLIC PANEL - Abnormal; Notable for the following:    Glucose, Bld 146 (*)    All other components within normal limits    Imaging Review Dg Chest 2 View  06/29/2014   CLINICAL DATA:  Upper respiratory infection 2 weeks ago. Still having productive cough, congestion, shortness of breath. Chills, wheezing.  EXAM: CHEST  2 VIEW  COMPARISON:   06/21/2014  FINDINGS: Slight increased right medial basilar opacity since prior study. Cannot exclude developing right basilar infiltrate/pneumonia. Left lung is clear. Heart is normal size. No effusions or acute bony abnormality.  IMPRESSION: Slight increased density at the right base, cannot exclude pneumonia.   Electronically Signed   By: Charlett Nose M.D.   On: 06/29/2014 12:33     EKG Interpretation None      MDM   Final diagnoses:  CAP (community acquired pneumonia)  Wheezing  Asthma exacerbation  SOB (shortness of breath)  Chest tightness    38 y.o. male with hx of asthma here with ongoing wheezing and SOB with productive cough and chest tightness. Given prednisone and inhaler on 06/21/14 without significant improvement. On exam, diffuse wheezing, dyspneic, SpO2 93-96% on RA, with scattered rhonchi. Will obtain xray imaging and give duoneb. Doubt ACS/PE. Will reassess shortly.   12:45 PM Xray reveals slight increased density in R base, cannot r/o PNA. After first duoneb pt improved minimally, still having significant tightness and rhonchi, mild wheezing, therefore will proceed with decadron and another duoneb. If unimproved, will attempt continuous nebs. Will  hold on blood work now, but may proceed with labs if pt cannot get significant improvement here, and admission is considered. Will reassess shortly.   1:46 PM Pt with slight improvement but still having dyspnea on exertion and SOB. Wheezing improved slightly. Will proceed with continuous nebs and obtain labs in case pt needs admission. Will get continuous pulse ox monitoring. Will reassess shortly.  4:17 PM Pt feeling improved after continuous albuterol. Some tachycardia which is expected. No ongoing wheezing, some scattered rhonchi in bases R>L but overall improved. Ambulated without desats, maintained sats >93%, slight tachycardia which is likely from albuterol, pt felt better ambulating. Agreed to proceed with outpatient  management of PNA, vs admission. Strict return precautions given. Discussed symptom control and f/up with resource guide provider for ongoing care. I explained the diagnosis and have given explicit precautions to return to the ER including for any other new or worsening symptoms. The patient understands and accepts the medical plan as it's been dictated and I have answered their questions. Discharge instructions concerning home care and prescriptions have been given. The patient is STABLE and is discharged to home in good condition.  BP 125/69 mmHg  Pulse 109  Temp(Src) 97.9 F (36.6 C) (Oral)  Resp 20  SpO2 95%  Meds ordered this encounter  Medications  . ipratropium-albuterol (DUONEB) 0.5-2.5 (3) MG/3ML nebulizer solution 3 mL    Sig:   . albuterol (PROVENTIL) (2.5 MG/3ML) 0.083% nebulizer solution 2.5 mg    Sig:   . dexamethasone (DECADRON) injection 10 mg    Sig:   . albuterol (PROVENTIL) (2.5 MG/3ML) 0.083% nebulizer solution 5 mg    Sig:   . ipratropium (ATROVENT) nebulizer solution 0.5 mg    Sig:   . albuterol (PROVENTIL,VENTOLIN) solution continuous neb    Sig:   . loratadine (CLARITIN) 10 MG tablet    Sig: Take 1 tablet (10 mg total) by mouth daily.    Dispense:  30 tablet    Refill:  0    Order Specific Question:  Supervising Provider    Answer:  Eber HongMILLER, BRIAN D [[id number]]  . levofloxacin (LEVAQUIN) 750 MG tablet    Sig: Take 1 tablet (750 mg total) by mouth daily. X 7 days    Dispense:  7 tablet    Refill:  0    Order Specific Question:  Supervising Provider    Answer:  Eber HongMILLER, BRIAN D [[id number]]  . albuterol (PROVENTIL HFA;VENTOLIN HFA) 108 (90 BASE) MCG/ACT inhaler    Sig: Inhale 2 puffs into the lungs every 2 (two) hours as needed for wheezing or shortness of breath (cough).    Dispense:  1 Inhaler    Refill:  0    Order Specific Question:  Supervising Provider    Answer:  Eber HongMILLER, BRIAN D [[id number]]  . guaiFENesin (MUCINEX) 600 MG 12 hr tablet    Sig: Take 1 tablet (600 mg  total) by mouth 2 (two) times daily as needed for cough or to loosen phlegm.    Dispense:  10 tablet    Refill:  0    Order Specific Question:  Supervising Provider    Answer:  Eber HongMILLER, BRIAN D [[id number]]     I personally performed the services described in this documentation, which was scribed in my presence. The recorded information has been reviewed and is accurate.  Donnita FallsMercedes Strupp West Yorkamprubi-Soms, [place] JerseyPA-C 06/29/14 1649  Richardean Canalavid H Yao, MD 06/30/14 [phone]

## 2014-06-29 NOTE — Discharge Instructions (Signed)
Continue to stay well-hydrated. Continue to alternate between Tylenol and Ibuprofen for pain or fever. Use Mucinex for cough suppression/expectoration of mucus. Use netipot and flonase to help with nasal congestion. May consider over-the-counter Benadryl or other antihistamine to decrease secretions and for watery itchy eyes. Use inhaler as directed, as needed for cough/chest congestion. Take all of your antibiotics as directed. Followup with your primary care doctor in 5-7 days for recheck of ongoing symptoms. If you don't have a primary doctor, use the list below to find one. Return to emergency department for emergent changing or worsening of symptoms.  Pneumonia Pneumonia is an infection of the lungs.  CAUSES Pneumonia may be caused by bacteria or a virus. Usually, these infections are caused by breathing infectious particles into the lungs (respiratory tract). SIGNS AND SYMPTOMS   Cough.  Fever.  Chest pain.  Increased rate of breathing.  Wheezing.  Mucus production. DIAGNOSIS  If you have the common symptoms of pneumonia, your health care provider will typically confirm the diagnosis with a chest X-ray. The X-ray will show an abnormality in the lung (pulmonary infiltrate) if you have pneumonia. Other tests of your blood, urine, or sputum may be done to find the specific cause of your pneumonia. Your health care provider may also do tests (blood gases or pulse oximetry) to see how well your lungs are working. TREATMENT  Some forms of pneumonia may be spread to other people when you cough or sneeze. You may be asked to wear a mask before and during your exam. Pneumonia that is caused by bacteria is treated with antibiotic medicine. Pneumonia that is caused by the influenza virus may be treated with an antiviral medicine. Most other viral infections must run their course. These infections will not respond to antibiotics.  HOME CARE INSTRUCTIONS   Cough suppressants may be used if you are  losing too much rest. However, coughing protects you by clearing your lungs. You should avoid using cough suppressants if you can.  Your health care provider may have prescribed medicine if he or she thinks your pneumonia is caused by bacteria or influenza. Finish your medicine even if you start to feel better.  Your health care provider may also prescribe an expectorant. This loosens the mucus to be coughed up.  Take medicines only as directed by your health care provider.  Do not smoke. Smoking is a common cause of bronchitis and can contribute to pneumonia. If you are a smoker and continue to smoke, your cough may last several weeks after your pneumonia has cleared.  A cold steam vaporizer or humidifier in your room or home may help loosen mucus.  Coughing is often worse at night. Sleeping in a semi-upright position in a recliner or using a couple pillows under your head will help with this.  Get rest as you feel it is needed. Your body will usually let you know when you need to rest. PREVENTION A pneumococcal shot (vaccine) is available to prevent a common bacterial cause of pneumonia. This is usually suggested for:  People over 82 years old.  Patients on chemotherapy.  People with chronic lung problems, such as bronchitis or emphysema.  People with immune system problems. If you are over 65 or have a high risk condition, you may receive the pneumococcal vaccine if you have not received it before. In some countries, a routine influenza vaccine is also recommended. This vaccine can help prevent some cases of pneumonia.You may be offered the influenza vaccine as part  of your care. If you smoke, it is time to quit. You may receive instructions on how to stop smoking. Your health care provider can provide medicines and counseling to help you quit. SEEK MEDICAL CARE IF: You have a fever. SEEK IMMEDIATE MEDICAL CARE IF:   Your illness becomes worse. This is especially true if you are  elderly or weakened from any other disease.  You cannot control your cough with suppressants and are losing sleep.  You begin coughing up blood.  You develop pain which is getting worse or is uncontrolled with medicines.  Any of the symptoms which initially brought you in for treatment are getting worse rather than better.  You develop shortness of breath or chest pain. MAKE SURE YOU:   Understand these instructions.  Will watch your condition.  Will get help right away if you are not doing well or get worse. Document Released: 06/18/2005 Document Revised: 11/02/2013 Document Reviewed: 09/07/2010 [facility] Patient Information 2015 [place], [place]. This information is not intended to replace advice given to you by your health care provider. Make sure you discuss any questions you have with your health care provider.  How to Use an Inhaler Using your inhaler correctly is very important. Good technique will make sure that the medicine reaches your lungs.  HOW TO USE AN INHALER:  Take the cap off the inhaler.  If this is the first time using your inhaler, you need to prime it. Shake the inhaler for 5 seconds. Release four puffs into the air, away from your face. Ask your doctor for help if you have questions.  Shake the inhaler for 5 seconds.  Turn the inhaler so the bottle is above the mouthpiece.  Put your pointer finger on top of the bottle. Your thumb holds the bottom of the inhaler.  Open your mouth.  Either hold the inhaler away from your mouth (the width of 2 fingers) or place your lips tightly around the mouthpiece. Ask your doctor which way to use your inhaler.  Breathe out as much air as possible.  Breathe in and push down on the bottle 1 time to release the medicine. You will feel the medicine go in your mouth and throat.  Continue to take a deep breath in very slowly. Try to fill your lungs.  After you have breathed in completely, hold your breath for 10 seconds.  This will help the medicine to settle in your lungs. If you cannot hold your breath for 10 seconds, hold it for as long as you can before you breathe out.  Breathe out slowly, through pursed lips. Whistling is an example of pursed lips.  If your doctor has told you to take more than 1 puff, wait at least 15-30 seconds between puffs. This will help you get the best results from your medicine. Do not use the inhaler more than your doctor tells you to.  Put the cap back on the inhaler.  Follow the directions from your doctor or from the inhaler package about cleaning the inhaler. If you use more than one inhaler, ask your doctor which inhalers to use and what order to use them in. Ask your doctor to help you figure out when you will need to refill your inhaler.  If you use a steroid inhaler, always rinse your mouth with water after your last puff, gargle and spit out the water. Do not swallow the water. GET HELP IF:  The inhaler medicine only partially helps to stop wheezing or shortness  of breath.  You are having trouble using your inhaler.  You have some increase in thick spit (phlegm). GET HELP RIGHT AWAY IF:  The inhaler medicine does not help your wheezing or shortness of breath or you have tightness in your chest.  You have dizziness, headaches, or fast heart rate.  You have chills, fever, or night sweats.  You have a large increase of thick spit, or your thick spit is bloody. MAKE SURE YOU:   Understand these instructions.  Will watch your condition.  Will get help right away if you are not doing well or get worse. Document Released: 03/27/2008 Document Revised: 04/08/2013 Document Reviewed: 01/15/2013 [facility] Patient Information 2015 [place], [place]. This information is not intended to replace advice given to you by your health care provider. Make sure you discuss any questions you have with your health care provider.  Shortness of Breath Shortness of breath means you have  trouble breathing. Shortness of breath needs medical care right away. HOME CARE   Do not smoke.  Avoid being around chemicals or things (paint fumes, dust) that may bother your breathing.  Rest as needed. Slowly begin your normal activities.  Only take medicines as told by your doctor.  Keep all doctor visits as told. GET HELP RIGHT AWAY IF:   Your shortness of breath gets worse.  You feel lightheaded, pass out (faint), or have a cough that is not helped by medicine.  You cough up blood.  You have pain with breathing.  You have pain in your chest, arms, shoulders, or belly (abdomen).  You have a fever.  You cannot walk up stairs or exercise the way you normally do.  You do not get better in the time expected.  You have a hard time doing normal activities even with rest.  You have problems with your medicines.  You have any new symptoms. MAKE SURE YOU:  Understand these instructions.  Will watch your condition.  Will get help right away if you are not doing well or get worse. Document Released: 12/05/2007 Document Revised: 06/23/2013 Document Reviewed: 09/03/2011 [facility] Patient Information 2015 [employer] CampusExitCare, [place]LLC. This information is not intended to replace advice given to you by your health care provider. Make sure you discuss any questions you have with your health care provider.   Emergency Department Resource Guide 1) Find a Doctor and Pay Out of Pocket Although you won't have to find out who is covered by your insurance plan, it is a good idea to ask around and get recommendations. You will then need to call the office and see if the doctor you have chosen will accept you as a new patient and what types of options they offer for patients who are self-pay. Some doctors offer discounts or will set up payment plans for their patients who do not have insurance, but you will need to ask so you aren't surprised when you get to your appointment.  2) Contact Your Local  Health Department Not all health departments have doctors that can see patients for sick visits, but many do, so it is worth a call to see if yours does. If you don't know where your local health department is, you can check in your phone book. The CDC also has a tool to help you locate your state's health department, and many state websites also have listings of all of their local health departments.  3) Find a Walk-in Clinic If your illness is not likely to be very severe or complicated,  you may want to try a walk in clinic. These are popping up all over the country in pharmacies, drugstores, and shopping centers. They're usually staffed by nurse practitioners or physician assistants that have been trained to treat common illnesses and complaints. They're usually fairly quick and inexpensive. However, if you have serious medical issues or chronic medical problems, these are probably not your best option.  No Primary Care Doctor: - Call Health Connect at  [phone] - they can help you locate a primary care doctor that  accepts your insurance, provides certain services, etc. - Physician Referral Service- [phone]  Chronic Pain Problems: Organization         Address  Phone   Notes  [facility]  [phone] Patients need to be referred by their primary care doctor.   Medication Assistance: Organization         Address  Phone   Notes  [facility] Medication [facility] [address] [place] [place], [place] [address] [phone] --Must be a resident of [facility] -- Must have NO insurance coverage whatsoever (no Medicaid/ Medicare, etc.) -- The pt. MUST have a primary care doctor that directs their care regularly and follows them in the community   [facility]  [phone]   [employer]  [phone]    Agencies that provide inexpensive medical care: Organization         Address  Phone   Notes  [facility]  [phone]    [facility]    [phone]   [facility] [address] [place], [place] [address] [phone]   [facility] [address] [place], [place] [phone]   [facility]    [phone]   [facility]    [phone]   [facility]  [address], [place] Phone:  [phone], Fax:  [phone] Hours of Operation:  9 am - 6 pm, M-F.  Also accepts Medicaid/Medicare and self-pay.  [facility]  [address], [place] Phone: [phone], Fax: [phone]. Hours of Operation:  8:30 am - 5:30 pm, M-F.  Also accepts Medicaid and self-pay.  [facility] [place] [address] [place] Phone: [phone]   [facility] [address] Natasha Bence [place], [place] [phone], Ext. [phone] Mondays & Thursdays: 7-9 AM.  First 15 patients are seen on a first come, first serve basis.    Medicaid-accepting [facility] Providers:  Organization         Address  Phone   Notes  [facility] [address], [place] [phone] Also accepts self-pay patients.  [facility] [address] Laurell Josephs [place], [place]  [phone]   [facility] [address], [place] [phone]   [facility] [address] [place], [place] [phone]   Renaye Rakers [address], [place]   [phone] Only accepts [place] patients after they have their name applied to their card.   Self-Pay (no insurance) in [facility]:  Organization         Address  Phone   Notes  Sickle Cell Patients, [facility] [address] [place], [place] [phone]   [facility] [address] [place], [place] (  [phone]   [facility]  [address],  [place] [phone]   [facility]/Dr. Osei-Bonsu  [address] [place], [place] or [address], [place] [phone] Phone number for both [place] and [place] locations is the same.  [facility] [address] [place], [place] [phone]   [facility] [address] [place] or [address] [phone]   [facility] [address], [place] [phone], phone; [phone], fax Sees patients 1st and 3rd Saturday of every month.  Must not qualify for public or private insurance (i.e. Medicaid, Medicare, [place] Health Choice, Veterans' Benefits)  Household income should be no more than 200% of the poverty level The clinic cannot treat you if you are pregnant or think you are pregnant  Sexually transmitted diseases are not treated at the clinic.

## 2014-08-14 ENCOUNTER — Encounter (HOSPITAL_COMMUNITY): Payer: Self-pay | Admitting: Emergency Medicine

## 2014-08-14 ENCOUNTER — Emergency Department (HOSPITAL_COMMUNITY)
Admission: EM | Admit: 2014-08-14 | Discharge: 2014-08-14 | Disposition: A | Discharge status: Home / Self Care | Payer: Medicaid Other | Source: Home / Self Care | Attending: Emergency Medicine | Admitting: Emergency Medicine

## 2014-08-14 ENCOUNTER — Emergency Department (INDEPENDENT_AMBULATORY_CARE_PROVIDER_SITE_OTHER): Payer: Medicaid Other

## 2014-08-14 DIAGNOSIS — J4530 Mild persistent asthma, uncomplicated: Secondary | ICD-10-CM

## 2014-08-14 DIAGNOSIS — R05 Cough: Secondary | ICD-10-CM

## 2014-08-14 DIAGNOSIS — R059 Cough, unspecified: Secondary | ICD-10-CM

## 2014-08-14 MED ORDER — METHYLPREDNISOLONE ACETATE 80 MG/ML IJ SUSP
80.0000 mg | Freq: Once | INTRAMUSCULAR | Status: AC
  Administered 2014-08-14: 80 mg via INTRAMUSCULAR

## 2014-08-14 MED ORDER — IPRATROPIUM-ALBUTEROL 0.5-2.5 (3) MG/3ML IN SOLN
RESPIRATORY_TRACT | Status: AC
  Filled 2014-08-14: qty 3

## 2014-08-14 MED ORDER — ALBUTEROL SULFATE HFA 108 (90 BASE) MCG/ACT IN AERS: 2.0000 | INHALATION_SPRAY | RESPIRATORY_TRACT | Status: DC | PRN

## 2014-08-14 MED ORDER — PREDNISONE 20 MG PO TABS: ORAL_TABLET | ORAL | Status: DC

## 2014-08-14 MED ORDER — IPRATROPIUM-ALBUTEROL 0.5-2.5 (3) MG/3ML IN SOLN
3.0000 mL | Freq: Once | RESPIRATORY_TRACT | Status: AC
  Administered 2014-08-14: 3 mL via RESPIRATORY_TRACT

## 2014-08-14 MED ORDER — BECLOMETHASONE DIPROPIONATE 80 MCG/ACT IN AERS: 2.0000 | INHALATION_SPRAY | Freq: Two times a day (BID) | RESPIRATORY_TRACT | Status: DC

## 2014-08-14 MED ORDER — METHYLPREDNISOLONE ACETATE 80 MG/ML IJ SUSP
INTRAMUSCULAR | Status: AC
  Filled 2014-08-14: qty 1

## 2014-08-14 MED ORDER — ALBUTEROL SULFATE (2.5 MG/3ML) 0.083% IN NEBU
INHALATION_SOLUTION | RESPIRATORY_TRACT | Status: AC
  Filled 2014-08-14: qty 3

## 2014-08-14 MED ORDER — ALBUTEROL SULFATE (2.5 MG/3ML) 0.083% IN NEBU
2.5000 mg | INHALATION_SOLUTION | Freq: Once | RESPIRATORY_TRACT | Status: AC
  Administered 2014-08-14: 2.5 mg via RESPIRATORY_TRACT

## 2014-08-14 NOTE — ED Notes (Signed)
C/o persistent SOB[facility] Sx also include dry cough Denies fevers, chills Alert, no signs of acute distress.

## 2014-08-14 NOTE — ED Provider Notes (Signed)
Chief Complaint   Shortness of Breath   History of Present Illness   Jesus Haynes is a 39 year old male who has had a prior history of asthma as a child. For the past 2 months he's had coughing, wheezing, and shortness of breath. He was seen twice at the emergency room in late December because of these symptoms. The first time he received a breathing treatment. The second time he also had a chest x-ray which had a question of infiltrate at the right base. He was treated with antibiotics. He never had a fever. He's continued to have coughing, chest tightness, wheezing, and shortness of breath. He gets symptoms both nighttime and daytime. The symptoms are worse at nighttime. They come on with exertion. He's never been hospitalized or intubated for asthma. He has been using his rescue inhaler daily and has run out of it. He's using more than one canister per month. He denies any allergy symptoms. He has no history of aspirin or anti-inflammatory sensitivity. No history of sinusitis. He is in a smoker and there are no other smokers in his home. He has not missed any work days.  Review of Systems   Other than as noted above, the patient denies any of the following symptoms. Systemic:  No fever, chills, or sweats. ENT:  No nasal congestion, sneezing, rhinorrhea, or sore throat. Lungs:  No cough, sputum production, or shortness of breath. No chest pain. Skin:  No rash or itching.  PMFSH   Past medical history, family history, social history, meds, and allergies were reviewed.   Physical Examination    Vital signs:  There were no vitals taken for this visit. General:  Alert, in no distress. Able to speak in full sentences. Has normal respiratory effort.  Eye:  No conjunctival injection or drainage. Lids were normal. ENT:  TMs and canals were normal, without erythema or inflammation.  Nasal mucosa was clear and uncongested, without drainage.  Mucous membranes were moist.  Pharynx was clear,  without exudate or drainage.  There were no oral ulcerations or lesions. Neck:  Supple, no adenopathy, tenderness or mass. Lungs:  No retractions or use of accessory muscles.  No respiratory distress.  He has bilateral expiratory wheezes. No rales or rhonchi, good air movement bilaterally. Heart:  Regular rhythm, without gallops, murmers or rubs. Skin:  Clear, warm, and dry, without rash or lesions.  Radiology   Dg Chest 2 View  08/14/2014   CLINICAL DATA:  Cough and shortness of breath.  EXAM: CHEST  2 VIEW  COMPARISON:  Chest radiograph 06/29/2014  FINDINGS: Stable cardiac mediastinal contours. No consolidative pulmonary opacities. No pleural effusion or pneumothorax. Regional skeleton is unremarkable.  IMPRESSION: No acute cardiopulmonary process.   Electronically Signed   By: Annia Belt M.D.   On: 08/14/2014 15:05    Course in Urgent Care Center   The following medications were given:  Medications  albuterol (PROVENTIL) (2.5 MG/3ML) 0.083% nebulizer solution 2.5 mg (2.5 mg Nebulization Given 08/14/14 1554)  ipratropium-albuterol (DUONEB) 0.5-2.5 (3) MG/3ML nebulizer solution 3 mL (3 mLs Nebulization Given 08/14/14 1554)  methylPREDNISolone acetate (DEPO-MEDROL) injection 80 mg (80 mg Intramuscular Given 08/14/14 1555)    After the above treatments for follow-up better and his lungs were nearly clear with only minimal residual expiratory wheezes and good air movement.    Assessment   The primary encounter diagnosis was Asthma, mild persistent, uncomplicated. A diagnosis of Cough was also pertinent to this visit.  His pneumonia has  cleared. He does have mild, persistent asthma. He will need follow-up, and I suggested our community health and wellness clinic. Mrs. [name] will be getting an orange card. In the meantime, medicines as listed below. Follow-up for worsening respiratory symptoms.  Plan    1.  Meds:  The following meds were prescribed:   New Prescriptions   ALBUTEROL  (PROVENTIL HFA;VENTOLIN HFA) 108 (90 BASE) MCG/ACT INHALER    Inhale 2 puffs into the lungs every 4 (four) hours as needed for wheezing or shortness of breath.   BECLOMETHASONE (QVAR) 80 MCG/ACT INHALER    Inhale 2 puffs into the lungs 2 (two) times daily.   PREDNISONE (DELTASONE) 20 MG TABLET    Take 3 daily for 5 days, 2 daily for 5 days, 1 daily for 5 days.    2.  Patient Education/Counseling:  The patient was given appropriate handouts, self care instructions, and instructed in symptomatic relief.    3.  Follow up:  The patient was told to follow up here if no better in 2 days, or sooner if becoming worse in any way, and given some red flag symptoms such as increasing difficulty breathing which would prompt immediate return.         Reuben Likesavid C Shila Kruczek, MD 08/14/14 [phone]

## 2014-08-14 NOTE — Discharge Instructions (Signed)

## 2014-11-05 ENCOUNTER — Emergency Department (INDEPENDENT_AMBULATORY_CARE_PROVIDER_SITE_OTHER): Payer: Medicaid Other

## 2014-11-05 ENCOUNTER — Encounter (HOSPITAL_COMMUNITY): Payer: Self-pay | Admitting: *Deleted

## 2014-11-05 ENCOUNTER — Emergency Department (INDEPENDENT_AMBULATORY_CARE_PROVIDER_SITE_OTHER)
Admission: EM | Admit: 2014-11-05 | Discharge: 2014-11-05 | Disposition: A | Discharge status: Home / Self Care | Payer: Medicaid Other | Source: Home / Self Care | Attending: Family Medicine | Admitting: Family Medicine

## 2014-11-05 DIAGNOSIS — J189 Pneumonia, unspecified organism: Secondary | ICD-10-CM | POA: Diagnosis not present

## 2014-11-05 MED ORDER — IPRATROPIUM BROMIDE 0.02 % IN SOLN
0.5000 mg | Freq: Once | RESPIRATORY_TRACT | Status: AC
  Administered 2014-11-05: 0.5 mg via RESPIRATORY_TRACT

## 2014-11-05 MED ORDER — ALBUTEROL SULFATE (2.5 MG/3ML) 0.083% IN NEBU
INHALATION_SOLUTION | RESPIRATORY_TRACT | Status: AC
  Filled 2014-11-05: qty 6

## 2014-11-05 MED ORDER — MOXIFLOXACIN HCL 400 MG PO TABS: 400.0000 mg | ORAL_TABLET | Freq: Every day | ORAL | Status: DC

## 2014-11-05 MED ORDER — METHYLPREDNISOLONE SODIUM SUCC 125 MG IJ SOLR
125.0000 mg | Freq: Once | INTRAMUSCULAR | Status: AC
  Administered 2014-11-05: 125 mg via INTRAMUSCULAR

## 2014-11-05 MED ORDER — ALBUTEROL SULFATE HFA 108 (90 BASE) MCG/ACT IN AERS: 2.0000 | INHALATION_SPRAY | Freq: Four times a day (QID) | RESPIRATORY_TRACT | Status: DC | PRN

## 2014-11-05 MED ORDER — IPRATROPIUM BROMIDE 0.02 % IN SOLN
RESPIRATORY_TRACT | Status: AC
  Filled 2014-11-05: qty 2.5

## 2014-11-05 MED ORDER — ALBUTEROL SULFATE (2.5 MG/3ML) 0.083% IN NEBU
5.0000 mg | INHALATION_SOLUTION | Freq: Once | RESPIRATORY_TRACT | Status: AC
  Administered 2014-11-05: 5 mg via RESPIRATORY_TRACT

## 2014-11-05 MED ORDER — METHYLPREDNISOLONE SODIUM SUCC 125 MG IJ SOLR
INTRAMUSCULAR | Status: AC
  Filled 2014-11-05: qty 2

## 2014-11-05 NOTE — ED Notes (Signed)
Plan of  Care discussed   With  The  Pt    Who  Verbalizes  Knowledge  Of  The  Plan  Of  Care           He   Reports  He  Is  Breathing  Much   Better             After  The  Treatments

## 2014-11-05 NOTE — Discharge Instructions (Signed)
Drink plenty of fluids as discussed, take all of medicine as prescribed, and mucinex or delsym for cough. Return or see your doctor if further problems

## 2014-11-05 NOTE — ED Provider Notes (Signed)
CSN: [id number]     Arrival date & time 11/05/14  1053 History   First MD Initiated Contact with Patient 11/05/14 1258     Chief Complaint  Patient presents with  . URI   (Consider location/radiation/quality/duration/timing/severity/associated sxs/prior Treatment) Patient is a 39 y.o. male presenting with cough. The history is provided by the patient.  Cough Cough characteristics:  Non-productive, dry and harsh Severity:  Moderate Onset quality:  Gradual Duration:  1 week Progression:  Worsening Chronicity:  New Smoker: no   Context: exposure to allergens and weather changes   Context: not sick contacts and not smoke exposure   Worsened by:  Nothing tried Ineffective treatments:  Beta-agonist inhaler Associated symptoms: rhinorrhea, shortness of breath and wheezing   Associated symptoms: no chest pain and no fever     Past Medical History  Diagnosis Date  . Asthma    History reviewed. No pertinent past surgical history. History reviewed. No pertinent family history. History  Substance Use Topics  . Smoking status: Former Smoker -- 0.50 packs/day    Types: Cigarettes    Quit date: 12/26/2012  . Smokeless tobacco: Not on file  . Alcohol Use: No    Review of Systems  Constitutional: Negative.  Negative for fever.  HENT: Positive for congestion and rhinorrhea.   Respiratory: Positive for cough, shortness of breath and wheezing.   Cardiovascular: Negative for chest pain.  Gastrointestinal: Negative.     Allergies  Review of patient's allergies indicates no known allergies.  Home Medications   Prior to Admission medications   Medication Sig Start Date End Date Taking? Authorizing Provider  albuterol (PROVENTIL HFA;VENTOLIN HFA) 108 (90 BASE) MCG/ACT inhaler Inhale 2 puffs into the lungs every 4 (four) hours as needed. Patient not taking: Reported on 06/21/2014 09/13/11   Melody Comasawn M Sampson, PA-C  albuterol (PROVENTIL HFA;VENTOLIN HFA) 108 (90 BASE) MCG/ACT inhaler Inhale  1-2 puffs into the lungs every 6 (six) hours as needed for wheezing or shortness of breath. Patient not taking: Reported on 06/21/2014 07/28/13   Linna HoffJames D Kindl, MD  albuterol (PROVENTIL HFA;VENTOLIN HFA) 108 (90 BASE) MCG/ACT inhaler Inhale 2 puffs into the lungs every 2 (two) hours as needed for wheezing or shortness of breath (cough). 06/29/14   Mercedes Camprubi-Soms, PA-C  albuterol (PROVENTIL HFA;VENTOLIN HFA) 108 (90 BASE) MCG/ACT inhaler Inhale 2 puffs into the lungs every 4 (four) hours as needed for wheezing or shortness of breath. 08/14/14   Reuben Likesavid C Keller, MD  beclomethasone (QVAR) 80 MCG/ACT inhaler Inhale 2 puffs into the lungs 2 (two) times daily. 08/14/14   Reuben Likesavid C Keller, MD  guaiFENesin (MUCINEX) 600 MG 12 hr tablet Take 1 tablet (600 mg total) by mouth 2 (two) times daily as needed for cough or to loosen phlegm. 06/29/14   Mercedes Camprubi-Soms, PA-C  HYDROcodone-homatropine (HYCODAN) 5-1.5 MG/5ML syrup Take 5 mLs by mouth every 6 (six) hours as needed. Patient taking differently: Take 5 mLs by mouth every 6 (six) hours as needed for cough.  06/21/14   Kaitlyn Szekalski, PA-C  ibuprofen (ADVIL,MOTRIN) 200 MG tablet Take 600 mg by mouth every 6 (six) hours as needed for mild pain.     Historical Provider, MD  levofloxacin (LEVAQUIN) 500 MG tablet Take 1 tablet (500 mg total) by mouth daily. Patient not taking: Reported on 06/21/2014 07/28/13   Linna HoffJames D Kindl, MD  levofloxacin (LEVAQUIN) 750 MG tablet Take 1 tablet (750 mg total) by mouth daily. X 7 days 06/29/14   Levi StraussMercedes Camprubi-Soms, PA-C  loratadine (CLARITIN) 10 MG tablet Take 1 tablet (10 mg total) by mouth daily. 06/29/14   Mercedes Camprubi-Soms, PA-C  methylPREDNISolone (MEDROL DOSEPAK) 4 MG tablet follow package directions Patient not taking: Reported on 06/21/2014 07/28/13   Linna HoffJames D Kindl, MD  moxifloxacin (AVELOX) 400 MG tablet Take 1 tablet (400 mg total) by mouth daily. 11/05/14   Linna HoffJames D Kindl, MD  predniSONE (DELTASONE) 20  MG tablet Take 2 tablets (40 mg total) by mouth daily. Patient not taking: Reported on 06/29/2014 06/21/14   Emilia BeckKaitlyn Szekalski, PA-C  predniSONE (DELTASONE) 20 MG tablet Take 3 daily for 5 days, 2 daily for 5 days, 1 daily for 5 days. 08/14/14   Reuben Likesavid C Keller, MD  trimethoprim-polymyxin b (POLYTRIM) ophthalmic solution Place 1 drop into both eyes every 4 (four) hours. Patient not taking: Reported on 06/21/2014 01/21/13   Hayden Rasmussenavid Mabe, NP   BP 105/71 mmHg  Pulse 91  Temp(Src) 98.3 F (36.8 C) (Oral)  Resp 18  SpO2 99% Physical Exam  Constitutional: He appears well-developed and well-nourished. No distress.  HENT:  Right Ear: External ear normal.  Left Ear: External ear normal.  Mouth/Throat: Oropharynx is clear and moist.  Eyes: Conjunctivae are normal. Pupils are equal, round, and reactive to light.  Neck: Normal range of motion. Neck supple.  Cardiovascular: Regular rhythm, normal heart sounds and intact distal pulses.   Pulmonary/Chest: Effort normal. He has wheezes.  Abdominal: Soft. Bowel sounds are normal. There is no tenderness.  Musculoskeletal: Normal range of motion. He exhibits no edema.  Lymphadenopathy:    He has no cervical adenopathy.  Neurological: He is alert.  Skin: Skin is warm and dry.  Nursing note and vitals reviewed.   ED Course  Procedures (including critical care time) Labs Review Labs Reviewed - No data to display  Imaging Review Dg Chest 2 View  11/05/2014   CLINICAL DATA:  Headache and chest congestion with cough and shortness of breath, no fever, chronic asthma, episode of pneumonia 2 months ago.  EXAM: CHEST  2 VIEW  COMPARISON:  PA and lateral chest of August 14, 2014  FINDINGS: The lungs are adequately inflated. There is new infiltrate in the right lower lobe. The heart and pulmonary vascularity are normal. The mediastinum is normal in width. The bony thorax is unremarkable.  IMPRESSION: Right lower lobe pneumonia. Followup PA and lateral chest X-ray  is recommended in 3-4 weeks following trial of antibiotic therapy to ensure resolution and exclude underlying malignancy.   Electronically Signed   By: David  SwazilandJordan M.D.   On: 11/05/2014 13:30    X-rays reviewed and report per radiologist.  MDM   1. CAP (community acquired pneumonia)    Lungs clear at time of d/c, sx improved.    Linna HoffJames D Kindl, MD 11/05/14 [phone]

## 2014-11-05 NOTE — ED Notes (Signed)
Pt  Reports    Cough  Congested  Body  Aches            Inhalers  Not  Working   Symptoms   For  Over  1    Week

## 2014-12-17 ENCOUNTER — Emergency Department (INDEPENDENT_AMBULATORY_CARE_PROVIDER_SITE_OTHER): Payer: Medicaid Other

## 2014-12-17 ENCOUNTER — Emergency Department (HOSPITAL_COMMUNITY)
Admission: EM | Admit: 2014-12-17 | Discharge: 2014-12-17 | Disposition: A | Discharge status: Home / Self Care | Payer: Medicaid Other | Source: Home / Self Care | Attending: Family Medicine | Admitting: Family Medicine

## 2014-12-17 ENCOUNTER — Encounter (HOSPITAL_COMMUNITY): Payer: Self-pay | Admitting: *Deleted

## 2014-12-17 DIAGNOSIS — J189 Pneumonia, unspecified organism: Secondary | ICD-10-CM

## 2014-12-17 MED ORDER — ALBUTEROL SULFATE (2.5 MG/3ML) 0.083% IN NEBU
INHALATION_SOLUTION | RESPIRATORY_TRACT | Status: AC
  Filled 2014-12-17: qty 6

## 2014-12-17 MED ORDER — IPRATROPIUM BROMIDE 0.02 % IN SOLN
0.5000 mg | Freq: Once | RESPIRATORY_TRACT | Status: AC
  Administered 2014-12-17: 0.5 mg via RESPIRATORY_TRACT

## 2014-12-17 MED ORDER — IPRATROPIUM BROMIDE 0.02 % IN SOLN
RESPIRATORY_TRACT | Status: AC
  Filled 2014-12-17: qty 2.5

## 2014-12-17 MED ORDER — MOXIFLOXACIN HCL 400 MG PO TABS: 400.0000 mg | ORAL_TABLET | Freq: Every day | ORAL | Status: DC

## 2014-12-17 MED ORDER — METHYLPREDNISOLONE SODIUM SUCC 125 MG IJ SOLR
125.0000 mg | Freq: Once | INTRAMUSCULAR | Status: AC
  Administered 2014-12-17: 125 mg via INTRAMUSCULAR

## 2014-12-17 MED ORDER — ALBUTEROL SULFATE (2.5 MG/3ML) 0.083% IN NEBU
5.0000 mg | INHALATION_SOLUTION | Freq: Once | RESPIRATORY_TRACT | Status: AC
  Administered 2014-12-17: 5 mg via RESPIRATORY_TRACT

## 2014-12-17 MED ORDER — METHYLPREDNISOLONE SODIUM SUCC 125 MG IJ SOLR
INTRAMUSCULAR | Status: AC
  Filled 2014-12-17: qty 2

## 2014-12-17 NOTE — ED Provider Notes (Signed)
CSN: [id number]     Arrival date & time 12/17/14  1659 History   First MD Initiated Contact with Patient 12/17/14 1838     Chief Complaint  Patient presents with  . URI   (Consider location/radiation/quality/duration/timing/severity/associated sxs/prior Treatment) Patient is a 39 y.o. male presenting with URI. The history is provided by the patient.  URI Presenting symptoms: congestion, cough and rhinorrhea   Presenting symptoms: no fever   Severity:  Mild Onset quality:  Gradual Duration:  2 days Progression:  Unchanged Chronicity:  New (here for f/u cxr from 5/16,,sx resolved but new congestion yest.) Associated symptoms: wheezing     Past Medical History  Diagnosis Date  . Asthma    History reviewed. No pertinent past surgical history. History reviewed. No pertinent family history. History  Substance Use Topics  . Smoking status: Former Smoker -- 0.50 packs/day    Types: Cigarettes    Quit date: 12/26/2012  . Smokeless tobacco: Not on file  . Alcohol Use: No    Review of Systems  Constitutional: Negative.  Negative for fever.  HENT: Positive for congestion, postnasal drip and rhinorrhea.   Respiratory: Positive for cough and wheezing. Negative for choking and shortness of breath.   Cardiovascular: Negative.   Gastrointestinal: Negative.     Allergies  Review of patient's allergies indicates no known allergies.  Home Medications   Prior to Admission medications   Medication Sig Start Date End Date Taking? Authorizing Provider  albuterol (PROVENTIL HFA;VENTOLIN HFA) 108 (90 BASE) MCG/ACT inhaler Inhale 2 puffs into the lungs every 6 (six) hours as needed for wheezing or shortness of breath. 11/05/14   Linna Hoff, MD  beclomethasone (QVAR) 80 MCG/ACT inhaler Inhale 2 puffs into the lungs 2 (two) times daily. 08/14/14   Reuben Likes, MD  guaiFENesin (MUCINEX) 600 MG 12 hr tablet Take 1 tablet (600 mg total) by mouth 2 (two) times daily as needed for cough or to  loosen phlegm. 06/29/14   Mercedes Camprubi-Soms, PA-C  HYDROcodone-homatropine (HYCODAN) 5-1.5 MG/5ML syrup Take 5 mLs by mouth every 6 (six) hours as needed. Patient taking differently: Take 5 mLs by mouth every 6 (six) hours as needed for cough.  06/21/14   Kaitlyn Szekalski, PA-C  ibuprofen (ADVIL,MOTRIN) 200 MG tablet Take 600 mg by mouth every 6 (six) hours as needed for mild pain.     Historical Provider, MD  levofloxacin (LEVAQUIN) 500 MG tablet Take 1 tablet (500 mg total) by mouth daily. Patient not taking: Reported on 06/21/2014 07/28/13   Linna Hoff, MD  levofloxacin (LEVAQUIN) 750 MG tablet Take 1 tablet (750 mg total) by mouth daily. X 7 days 06/29/14   Mercedes Camprubi-Soms, PA-C  loratadine (CLARITIN) 10 MG tablet Take 1 tablet (10 mg total) by mouth daily. 06/29/14   Mercedes Camprubi-Soms, PA-C  methylPREDNISolone (MEDROL DOSEPAK) 4 MG tablet follow package directions Patient not taking: Reported on 06/21/2014 07/28/13   Linna Hoff, MD  moxifloxacin (AVELOX) 400 MG tablet Take 1 tablet (400 mg total) by mouth daily. 12/17/14   Linna Hoff, MD  predniSONE (DELTASONE) 20 MG tablet Take 2 tablets (40 mg total) by mouth daily. Patient not taking: Reported on 06/29/2014 06/21/14   Emilia Beck, PA-C  predniSONE (DELTASONE) 20 MG tablet Take 3 daily for 5 days, 2 daily for 5 days, 1 daily for 5 days. 08/14/14   Reuben Likes, MD  trimethoprim-polymyxin b (POLYTRIM) ophthalmic solution Place 1 drop into both eyes every 4 (four)  hours. Patient not taking: Reported on 06/21/2014 01/21/13   Hayden Rasmussen, NP   BP 133/84 mmHg  Pulse 83  Temp(Src) 98.4 F (36.9 C) (Oral)  Resp 16  SpO2 97% Physical Exam  Constitutional: He is oriented to person, place, and time. He appears well-developed and well-nourished. No distress.  HENT:  Right Ear: External ear normal.  Left Ear: External ear normal.  Mouth/Throat: Oropharynx is clear and moist.  Eyes: Pupils are equal, round, and  reactive to light.  Neck: Normal range of motion. Neck supple.  Cardiovascular: Normal heart sounds and intact distal pulses.   Pulmonary/Chest: He has wheezes.  Lymphadenopathy:    He has no cervical adenopathy.  Neurological: He is alert and oriented to person, place, and time.  Skin: Skin is warm and dry.  Nursing note and vitals reviewed.   ED Course  Procedures (including critical care time) Labs Review Labs Reviewed - No data to display  Imaging Review Dg Chest 2 View  12/17/2014   CLINICAL DATA:  Followup pneumonia.  Pneumonia 11/05/2014  EXAM: CHEST  2 VIEW  COMPARISON:  Radiograph 11/05/2014  FINDINGS: Interval clearing of the airspace opacity in the right lower lobe. There is a new focus of subtle airspace disease in the right upper lobe measuring approximately 2-3 cm. The no pleural fluid. No pulmonary edema.  IMPRESSION: 1. Resolution of right lower lobe pneumonia. 2. Concern for new focus of pulmonary infection in the right upper lobe. Recommend follow-up radiographs or CT to evaluate this potential migrating pneumonia.   Electronically Signed   By: Genevive Bi M.D.   On: 12/17/2014 19:21   X-rays reviewed and report per radiologist.   MDM   1. CAP (community acquired pneumonia)        Linna Hoff, MD 12/17/14 1950

## 2014-12-17 NOTE — ED Notes (Signed)
Pt     Reports  He   Had  pnuemonia  About  1  Month  Ago       He  States  He    Never  Had  A  followup  He    States        sev  Days  Ago      He  Developed    Some  Cough   And  Congestion         He   Has  A  History  Of  Asthma  And  Takes   Albuterol

## 2014-12-17 NOTE — Discharge Instructions (Signed)
Take all of medicine daily, no more smoking, drink lots of fluids, return for repeat x-ray in 2 weeks.

## 2015-08-24 ENCOUNTER — Emergency Department (HOSPITAL_COMMUNITY)
Admission: EM | Admit: 2015-08-24 | Discharge: 2015-08-24 | Disposition: A | Discharge status: Home / Self Care | Payer: Medicaid Other | Attending: Emergency Medicine | Admitting: Emergency Medicine

## 2015-08-24 ENCOUNTER — Encounter (HOSPITAL_COMMUNITY): Payer: Self-pay | Admitting: Cardiology

## 2015-08-24 DIAGNOSIS — Z792 Long term (current) use of antibiotics: Secondary | ICD-10-CM | POA: Diagnosis not present

## 2015-08-24 DIAGNOSIS — Z7951 Long term (current) use of inhaled steroids: Secondary | ICD-10-CM | POA: Insufficient documentation

## 2015-08-24 DIAGNOSIS — J020 Streptococcal pharyngitis: Secondary | ICD-10-CM | POA: Diagnosis not present

## 2015-08-24 DIAGNOSIS — J45909 Unspecified asthma, uncomplicated: Secondary | ICD-10-CM | POA: Insufficient documentation

## 2015-08-24 DIAGNOSIS — R509 Fever, unspecified: Secondary | ICD-10-CM | POA: Diagnosis present

## 2015-08-24 DIAGNOSIS — Z79899 Other long term (current) drug therapy: Secondary | ICD-10-CM | POA: Insufficient documentation

## 2015-08-24 DIAGNOSIS — Z87891 Personal history of nicotine dependence: Secondary | ICD-10-CM | POA: Diagnosis not present

## 2015-08-24 LAB — CBC WITH DIFFERENTIAL/PLATELET
Basophils Absolute: 0.1 10*3/uL (ref 0.0–0.1)
Basophils Relative: 0 %
EOS ABS: 0.1 10*3/uL (ref 0.0–0.7)
Eosinophils Relative: 1 %
HCT: 42.7 % (ref 39.0–52.0)
HEMOGLOBIN: 14.3 g/dL (ref 13.0–17.0)
LYMPHS ABS: 1.9 10*3/uL (ref 0.7–4.0)
LYMPHS PCT: 12 %
MCH: 30 pg (ref 26.0–34.0)
MCHC: 33.5 g/dL (ref 30.0–36.0)
MCV: 89.5 fL (ref 78.0–100.0)
Monocytes Absolute: 1.5 10*3/uL — ABNORMAL HIGH (ref 0.1–1.0)
Monocytes Relative: 10 %
NEUTROS ABS: 12 10*3/uL — AB (ref 1.7–7.7)
NEUTROS PCT: 77 %
Platelets: 194 10*3/uL (ref 150–400)
RBC: 4.77 MIL/uL (ref 4.22–5.81)
RDW: 13.6 % (ref 11.5–15.5)
WBC: 15.6 10*3/uL — AB (ref 4.0–10.5)

## 2015-08-24 LAB — RAPID STREP SCREEN (MED CTR MEBANE ONLY): Streptococcus, Group A Screen (Direct): POSITIVE — AB

## 2015-08-24 LAB — BASIC METABOLIC PANEL
Anion gap: 11 (ref 5–15)
BUN: 13 mg/dL (ref 6–20)
CO2: 27 mmol/L (ref 22–32)
Calcium: 9 mg/dL (ref 8.9–10.3)
Chloride: 104 mmol/L (ref 101–111)
Creatinine, Ser: 0.85 mg/dL (ref 0.61–1.24)
GFR calc Af Amer: >60 mL/min (ref >60)
GFR calc non Af Amer: >60 mL/min (ref >60)
Glucose, Bld: 106 mg/dL — ABNORMAL HIGH (ref 65–99)
Potassium: 4 mmol/L (ref 3.5–5.1)
SODIUM: 142 mmol/L (ref 135–145)

## 2015-08-24 MED ORDER — KETOROLAC TROMETHAMINE 30 MG/ML IJ SOLN
30.0000 mg | Freq: Once | INTRAMUSCULAR | Status: AC
  Administered 2015-08-24: 30 mg via INTRAVENOUS
  Filled 2015-08-24: qty 1

## 2015-08-24 MED ORDER — ACETAMINOPHEN-CODEINE 120-12 MG/5ML PO SUSP: 5.0000 mL | Freq: Four times a day (QID) | ORAL | Status: DC | PRN

## 2015-08-24 MED ORDER — PENICILLIN G BENZATHINE 1200000 UNIT/2ML IM SUSP
1.2000 10*6.[IU] | Freq: Once | INTRAMUSCULAR | Status: AC
  Administered 2015-08-24: 1.2 10*6.[IU] via INTRAMUSCULAR
  Filled 2015-08-24: qty 2

## 2015-08-24 MED ORDER — SODIUM CHLORIDE 0.9 % IV BOLUS (SEPSIS)
1000.0000 mL | Freq: Once | INTRAVENOUS | Status: AC
  Administered 2015-08-24: 1000 mL via INTRAVENOUS

## 2015-08-24 MED ORDER — DEXAMETHASONE SODIUM PHOSPHATE 10 MG/ML IJ SOLN
6.0000 mg | Freq: Once | INTRAMUSCULAR | Status: AC
  Administered 2015-08-24: 6 mg via INTRAVENOUS
  Filled 2015-08-24: qty 1

## 2015-08-24 MED ORDER — ALBUTEROL SULFATE HFA 108 (90 BASE) MCG/ACT IN AERS: 2.0000 | INHALATION_SPRAY | Freq: Four times a day (QID) | RESPIRATORY_TRACT | Status: DC | PRN

## 2015-08-24 NOTE — Discharge Instructions (Signed)

## 2015-08-24 NOTE — ED Provider Notes (Signed)
CSN: [id number]     Arrival date & time 08/24/15  8295 History  By signing my name below, I, Tanda Rockers, attest that this documentation has been prepared under the direction and in the presence of Danelle Berry, PA-C. Electronically Signed: Tanda Rockers, ED Scribe. 08/24/2015. 1:30 PM.   Chief Complaint  Patient presents with  . Sore Throat  . Fever   The history is provided by the patient. No language interpreter was used.     HPI Comments: Jesus Haynes is a 40 y.o. male who presents to the Emergency Department complaining of gradual onset, constant, moderate, sore throat x 5 days, worsening 4 days ago.  ST pain rated 8/10, which has gradually worsened until her was unable to tolerate his secretions secondary to pain.  He can open his mouth and swallow but states it is painful.   Pt also complains of an intermittent fever, nasal congestion. His temperature in the ED is 99.6. Denies cough, wheeze, SOB, rhinorrhea, nausea, vomiting, abdominal pain, neck pain, or any other associated symptoms.   Past Medical History  Diagnosis Date  . Asthma    History reviewed. No pertinent past surgical history. History reviewed. No pertinent family history. Social History  Substance Use Topics  . Smoking status: Former Smoker -- 0.50 packs/day    Types: Cigarettes    Quit date: 12/26/2012  . Smokeless tobacco: None  . Alcohol Use: No    Review of Systems  Constitutional: Positive for fever, chills and diaphoresis. Negative for activity change, appetite change, fatigue and unexpected weight change.  HENT: Positive for congestion, sore throat, trouble swallowing and voice change. Negative for drooling, ear discharge, ear pain, facial swelling and rhinorrhea.   Eyes: Negative.   Respiratory: Negative.  Negative for cough, shortness of breath and wheezing.   Gastrointestinal: Negative for nausea, vomiting and abdominal pain.  Endocrine: Negative.   Genitourinary: Negative.   Musculoskeletal:  Negative.   Skin: Negative.  Negative for rash.  Neurological: Positive for headaches. Negative for dizziness, syncope, weakness and light-headedness.  Psychiatric/Behavioral: Negative.   All other systems reviewed and are negative.  Allergies  Review of patient's allergies indicates no known allergies.  Home Medications   Prior to Admission medications   Medication Sig Start Date End Date Taking? Authorizing Provider  acetaminophen-codeine 120-12 MG/5ML suspension Take 5 mLs by mouth every 6 (six) hours as needed for pain. 08/24/15   Danelle Berry, PA-C  albuterol (PROVENTIL HFA;VENTOLIN HFA) 108 (90 Base) MCG/ACT inhaler Inhale 2 puffs into the lungs every 6 (six) hours as needed for wheezing or shortness of breath. 08/24/15   Danelle Berry, PA-C  beclomethasone (QVAR) 80 MCG/ACT inhaler Inhale 2 puffs into the lungs 2 (two) times daily. 08/14/14   Reuben Likes, MD  guaiFENesin (MUCINEX) 600 MG 12 hr tablet Take 1 tablet (600 mg total) by mouth 2 (two) times daily as needed for cough or to loosen phlegm. 06/29/14   Mercedes Camprubi-Soms, PA-C  HYDROcodone-homatropine (HYCODAN) 5-1.5 MG/5ML syrup Take 5 mLs by mouth every 6 (six) hours as needed. Patient taking differently: Take 5 mLs by mouth every 6 (six) hours as needed for cough.  06/21/14   Kaitlyn Szekalski, PA-C  ibuprofen (ADVIL,MOTRIN) 200 MG tablet Take 600 mg by mouth every 6 (six) hours as needed for mild pain.     Historical Provider, MD  levofloxacin (LEVAQUIN) 500 MG tablet Take 1 tablet (500 mg total) by mouth daily. Patient not taking: Reported on 06/21/2014 07/28/13  Linna Hoff, MD  levofloxacin (LEVAQUIN) 750 MG tablet Take 1 tablet (750 mg total) by mouth daily. X 7 days 06/29/14   Mercedes Camprubi-Soms, PA-C  loratadine (CLARITIN) 10 MG tablet Take 1 tablet (10 mg total) by mouth daily. 06/29/14   Mercedes Camprubi-Soms, PA-C  methylPREDNISolone (MEDROL DOSEPAK) 4 MG tablet follow package directions Patient not taking:  Reported on 06/21/2014 07/28/13   Linna Hoff, MD  moxifloxacin (AVELOX) 400 MG tablet Take 1 tablet (400 mg total) by mouth daily. 12/17/14   Linna Hoff, MD  predniSONE (DELTASONE) 20 MG tablet Take 2 tablets (40 mg total) by mouth daily. Patient not taking: Reported on 06/29/2014 06/21/14   Emilia Beck, PA-C  predniSONE (DELTASONE) 20 MG tablet Take 3 daily for 5 days, 2 daily for 5 days, 1 daily for 5 days. 08/14/14   Reuben Likes, MD  trimethoprim-polymyxin b (POLYTRIM) ophthalmic solution Place 1 drop into both eyes every 4 (four) hours. Patient not taking: Reported on 06/21/2014 01/21/13   Hayden Rasmussen, NP   BP 123/52 mmHg  Pulse 71  Temp(Src) 99.1 F (37.3 C) (Oral)  Resp 19  SpO2 95%   Physical Exam  Constitutional: He is oriented to person, place, and time. He appears well-developed and well-nourished. No distress.  HENT:  Head: Normocephalic and atraumatic.  Right Ear: External ear normal.  Left Ear: External ear normal.  Nose: Nose normal.  Mouth/Throat: Uvula is midline. Mucous membranes are dry. Oropharyngeal exudate, posterior oropharyngeal edema and posterior oropharyngeal erythema present.  Erythema, edema and exudates across the soft palate and uvula, Uvula is midline, Tonsils are 4+, posterior wall of pharynx not visible Dry oral mucosa Dry lips +cervical lymphadenopathy No trismus  Eyes: Conjunctivae and EOM are normal. Pupils are equal, round, and reactive to light. Right eye exhibits no discharge. Left eye exhibits no discharge. No scleral icterus.  Neck: Normal range of motion. Neck supple. No JVD present. No tracheal deviation present. No thyromegaly present.  Cardiovascular: Normal rate, regular rhythm, normal heart sounds and intact distal pulses.  Exam reveals no gallop and no friction rub.   No murmur heard. Pulmonary/Chest: Effort normal and breath sounds normal. No respiratory distress. He has no wheezes. He has no rales. He exhibits no tenderness.   Abdominal: Soft. Bowel sounds are normal. He exhibits no distension and no mass. There is no tenderness. There is no rebound and no guarding.  Musculoskeletal: Normal range of motion. He exhibits no edema or tenderness.  Lymphadenopathy:    He has cervical adenopathy.  Neurological: He is alert and oriented to person, place, and time. He has normal reflexes. No cranial nerve deficit. He exhibits normal muscle tone. Coordination normal.  Skin: Skin is warm. No rash noted. He is diaphoretic. No erythema. There is pallor.  Decreased skin turgor  Psychiatric: He has a normal mood and affect. His behavior is normal. Judgment and thought content normal.  Nursing note and vitals reviewed.   ED Course  Procedures (including critical care time)  DIAGNOSTIC STUDIES: Oxygen Saturation is 99% on RA, normal by my interpretation.    COORDINATION OF CARE: 1:25 PM-Discussed treatment plan with pt at bedside and pt agreed to plan.   Pt is requesting an albuterol inhaler at this time due to PMHx of asthma. He states he has had a mild cough and wheezing recently.   Labs Review Labs Reviewed  RAPID STREP SCREEN (NOT AT [facility]) - Abnormal; Notable for the following:    Streptococcus,  Group A Screen (Direct) POSITIVE (*)    All other components within normal limits  CBC WITH DIFFERENTIAL/PLATELET - Abnormal; Notable for the following:    WBC 15.6 (*)    Neutro Abs 12.0 (*)    Monocytes Absolute 1.5 (*)    All other components within normal limits  BASIC METABOLIC PANEL - Abnormal; Notable for the following:    Glucose, Bld 106 (*)    All other components within normal limits    Imaging Review No results found. I have personally reviewed and evaluated these lab results as part of my medical decision-making.   EKG Interpretation None      MDM   40 y/o male with hx of asthma, has + strep throat, presented with normal VS, but ill-appearing, pale, diaphoretic and dehydrated.  tonsilar pillars,  uvula, and bilateral tonsils severely edematous, erythematous with diffuse exudates. On exam cannot visualize posterior wall of oropharynx.  Basic labs obtained, pt given IVF, decadron, toradol and IM bicillin. Labs are pertinent for leukocytosis of 15.6.  He had clinical improvement with IV fluids, was able to visualize the posterior oropharynx, tonsils are no longer kissing.  Swelling is generally improved. He is able to tolerate small sips of fluids. He states that he has been able to take ibuprofen at home and he has continued to work.  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  Strict return precautions were reviewed with the patient at length. They were also provided to him in printed form.  He verbally acknowledged that he needs to return to the ER immediately with any difficulty breathing, muffled speech, unilateral ear pain.  He was encouraged to continue take Tylenol and ibuprofen as needed for pain and fever and to push clear fluids as tolerated.  He was discharged in improved condition with VSS. He requested a refill of his albuterol inhaler which was provided to him   Final diagnoses:  Strep throat   I personally performed the services described in this documentation, which was scribed in my presence. The recorded information has been reviewed and is accurate.     Danelle Berry, PA-C 08/24/15 1405  Eber Hong, MD 08/24/15 1600

## 2015-08-24 NOTE — ED Notes (Signed)
Pt reports a sore throat and fever at home for the past couple of days. OTC medications with no relief.

## 2016-02-09 IMAGING — CR DG CHEST 2V
2 series · 2 of 2 positions shown · non-contrast
Comparison: 07/28/2013

CLINICAL DATA: Cough and shortness of breath.  Sinus pressure.

EXAM:
CHEST  2 VIEW

[chest pa]
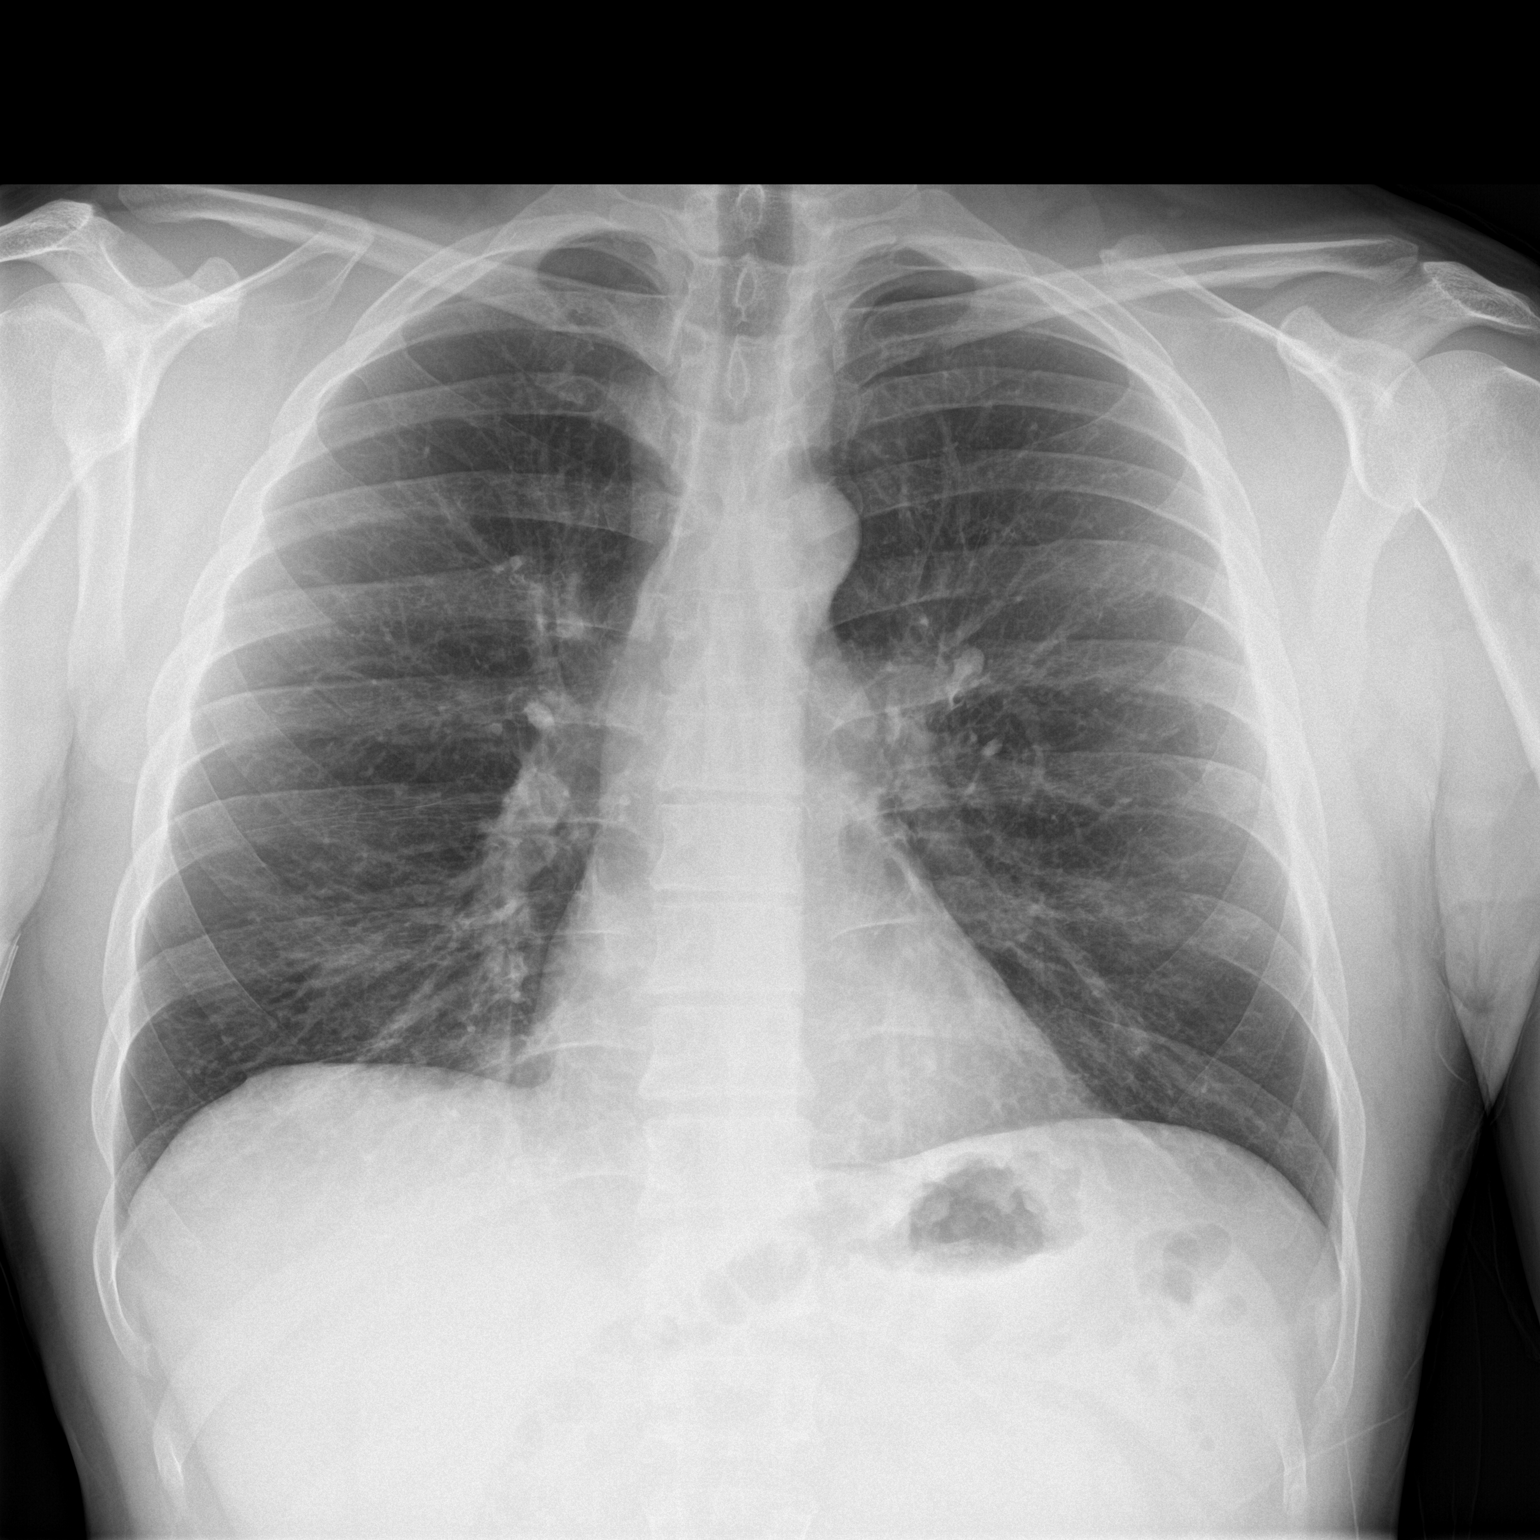

[chest lat]
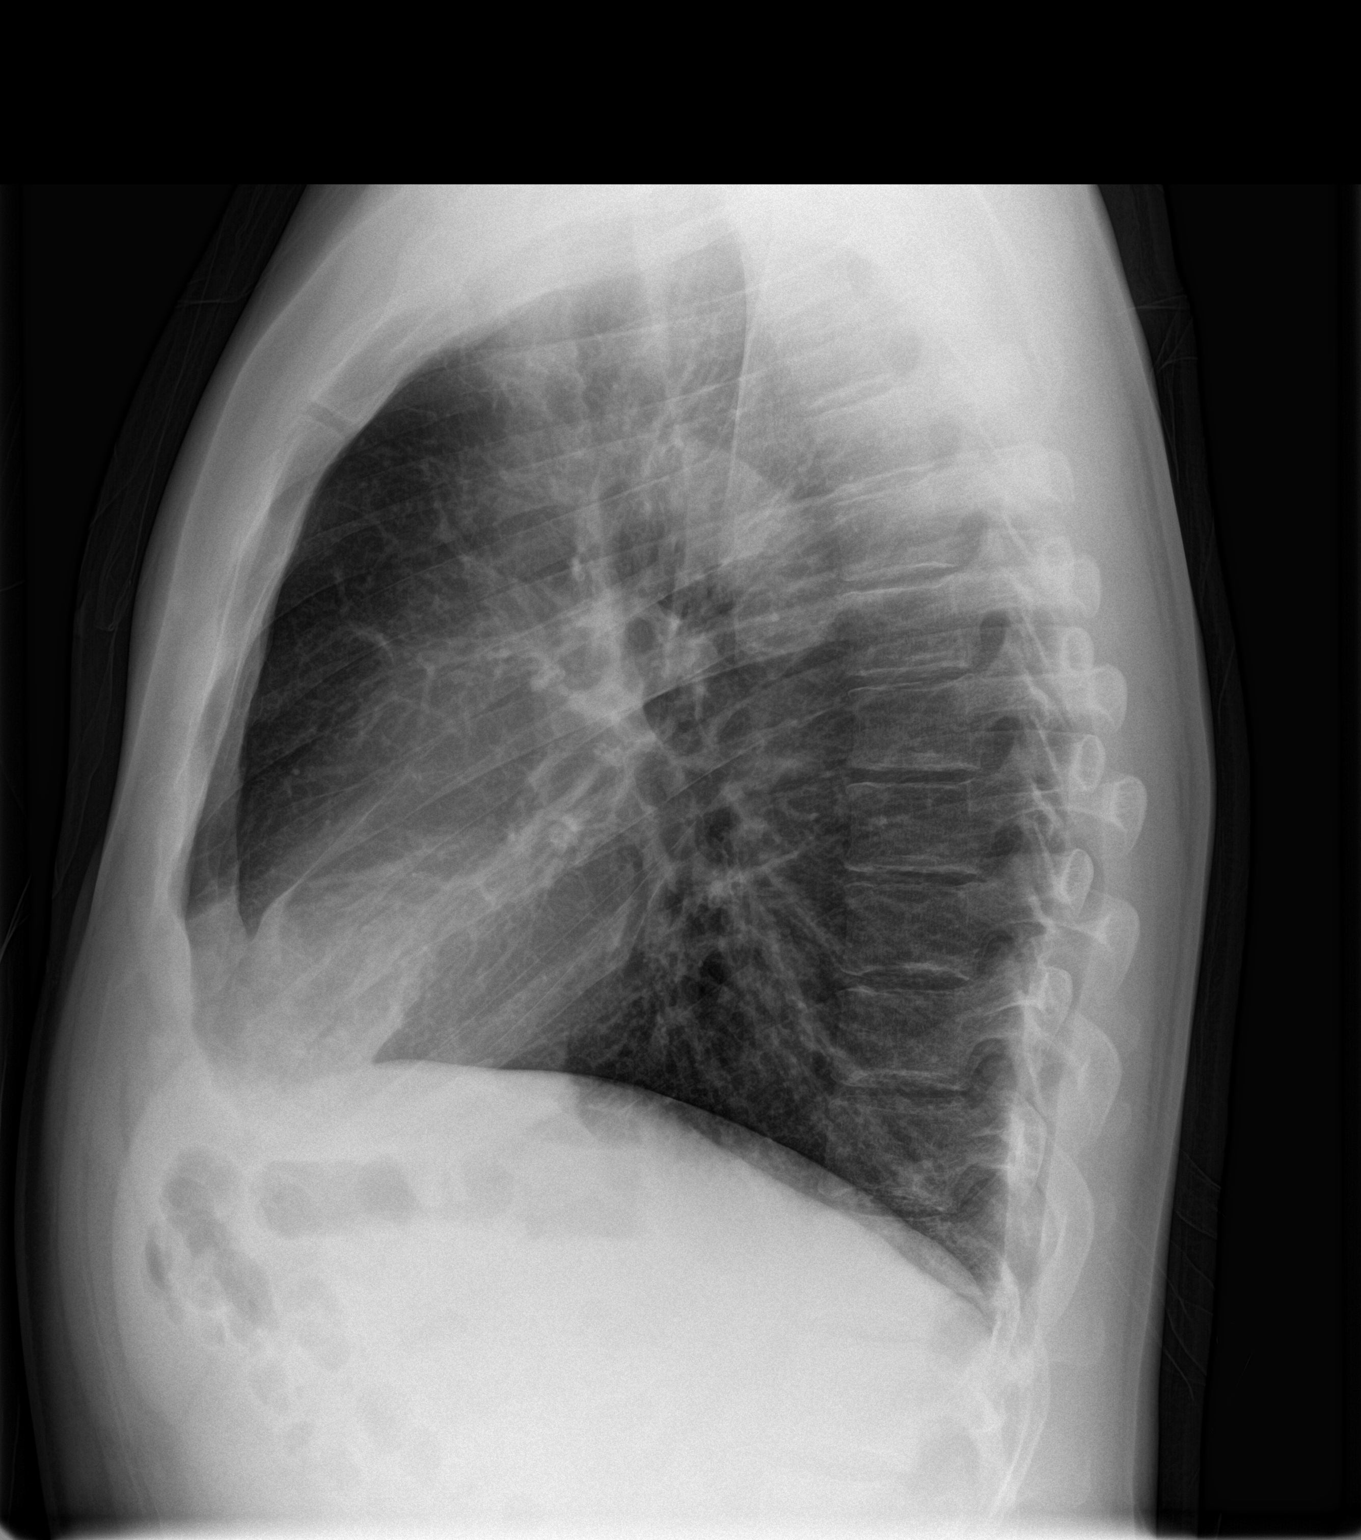

[2 of 2 positions shown; findings below may reference images not displayed]

FINDINGS: The cardiomediastinal contours are normal. The lungs are clear.
Pulmonary vasculature is normal. No consolidation, pleural effusion,
or pneumothorax. No acute osseous abnormalities are seen.
IMPRESSION: No acute pulmonary process.

## 2016-07-12 ENCOUNTER — Encounter (HOSPITAL_COMMUNITY): Payer: Self-pay | Admitting: Emergency Medicine

## 2016-07-12 ENCOUNTER — Emergency Department (HOSPITAL_COMMUNITY): Payer: Medicaid Other

## 2016-07-12 ENCOUNTER — Emergency Department (HOSPITAL_COMMUNITY)
Admission: EM | Admit: 2016-07-12 | Discharge: 2016-07-12 | Disposition: A | Discharge status: Home / Self Care | Payer: Medicaid Other | Attending: Emergency Medicine | Admitting: Emergency Medicine

## 2016-07-12 DIAGNOSIS — J069 Acute upper respiratory infection, unspecified: Secondary | ICD-10-CM | POA: Insufficient documentation

## 2016-07-12 DIAGNOSIS — J45909 Unspecified asthma, uncomplicated: Secondary | ICD-10-CM | POA: Insufficient documentation

## 2016-07-12 DIAGNOSIS — J029 Acute pharyngitis, unspecified: Secondary | ICD-10-CM | POA: Insufficient documentation

## 2016-07-12 DIAGNOSIS — Z87891 Personal history of nicotine dependence: Secondary | ICD-10-CM | POA: Insufficient documentation

## 2016-07-12 DIAGNOSIS — Z79899 Other long term (current) drug therapy: Secondary | ICD-10-CM | POA: Insufficient documentation

## 2016-07-12 LAB — RAPID STREP SCREEN (MED CTR MEBANE ONLY): Streptococcus, Group A Screen (Direct): NEGATIVE

## 2016-07-12 MED ORDER — BENZONATATE 100 MG PO CAPS: 100.0000 mg | ORAL_CAPSULE | Freq: Three times a day (TID) | ORAL | 0 refills | Status: DC

## 2016-07-12 MED ORDER — ALBUTEROL SULFATE (2.5 MG/3ML) 0.083% IN NEBU
INHALATION_SOLUTION | RESPIRATORY_TRACT | Status: AC
  Administered 2016-07-12: 5 mg
  Filled 2016-07-12: qty 6

## 2016-07-12 MED ORDER — ALBUTEROL SULFATE HFA 108 (90 BASE) MCG/ACT IN AERS
2.0000 | INHALATION_SPRAY | Freq: Once | RESPIRATORY_TRACT | Status: AC
  Administered 2016-07-12: 2 via RESPIRATORY_TRACT
  Filled 2016-07-12: qty 6.7

## 2016-07-12 MED ORDER — AMOXICILLIN 500 MG PO CAPS: 500.0000 mg | ORAL_CAPSULE | Freq: Three times a day (TID) | ORAL | 0 refills | Status: DC

## 2016-07-12 NOTE — ED Triage Notes (Signed)
Pt presents to ED for sore throat, generalized body aches, and chills at home.  Son was recently diagnosed with strep throat.  Pt also states difficulty with asthma x 2-3 weeks.  Pt wheezing, will give treatment in triage,

## 2016-07-12 NOTE — ED Provider Notes (Signed)
[facility]-EMERGENCY DEPT Provider Note   CSN: [id number] Arrival date & time: 07/12/16  1841   By signing my name below, I, [name], attest that this documentation has been prepared under the direction and in the presence of Cort Dragoo, PA-C. Electronically Signed: [name], ED Scribe. 07/12/2016. 8:36 PM.   History   Chief Complaint Chief Complaint  Patient presents with  . Sore Throat  . Generalized Body Aches  . Asthma    The history is provided by the patient. No language interpreter was used.   HPI Comments:  Jesus Haynes is a 41 y.o. male with PMHx of asthma who presents to the Emergency Department complaining of sore throat since last night. Pt complains of associated chills, cough, subjective fever last night, headache, generalized body aches. Pt reports that his son was recently diagnosed with strep throat. Pt was given a breathing treatment at the ED and has taken advil with mild relief. Pt denies other associated symptoms.   Past Medical History:  Diagnosis Date  . Asthma     There are no active problems to display for this patient.   History reviewed. No pertinent surgical history.     Home Medications    Prior to Admission medications   Medication Sig Start Date End Date Taking? Authorizing Provider  acetaminophen-codeine 120-12 MG/5ML suspension Take 5 mLs by mouth every 6 (six) hours as needed for pain. 08/24/15   Danelle Berry, PA-C  albuterol (PROVENTIL HFA;VENTOLIN HFA) 108 (90 Base) MCG/ACT inhaler Inhale 2 puffs into the lungs every 6 (six) hours as needed for wheezing or shortness of breath. 08/24/15   Danelle Berry, PA-C  beclomethasone (QVAR) 80 MCG/ACT inhaler Inhale 2 puffs into the lungs 2 (two) times daily. 08/14/14   Reuben Likes, MD  guaiFENesin (MUCINEX) 600 MG 12 hr tablet Take 1 tablet (600 mg total) by mouth 2 (two) times daily as needed for cough or to loosen phlegm. 06/29/14   Mercedes Strupp Street, PA-C    HYDROcodone-homatropine [facility]) 5-1.5 MG/5ML syrup Take 5 mLs by mouth every 6 (six) hours as needed. Patient taking differently: Take 5 mLs by mouth every 6 (six) hours as needed for cough.  06/21/14   Kaitlyn Szekalski, PA-C  ibuprofen (ADVIL,MOTRIN) 200 MG tablet Take 600 mg by mouth every 6 (six) hours as needed for mild pain.     Historical Provider, MD  levofloxacin (LEVAQUIN) 500 MG tablet Take 1 tablet (500 mg total) by mouth daily. Patient not taking: Reported on 06/21/2014 07/28/13   Linna Hoff, MD  levofloxacin (LEVAQUIN) 750 MG tablet Take 1 tablet (750 mg total) by mouth daily. X 7 days 06/29/14   Donnita Falls Street, PA-C  loratadine (CLARITIN) 10 MG tablet Take 1 tablet (10 mg total) by mouth daily. 06/29/14   Mercedes Strupp Street, PA-C  methylPREDNISolone (MEDROL DOSEPAK) 4 MG tablet follow package directions Patient not taking: Reported on 06/21/2014 07/28/13   Linna Hoff, MD  moxifloxacin (AVELOX) 400 MG tablet Take 1 tablet (400 mg total) by mouth daily. 12/17/14   Linna Hoff, MD  predniSONE (DELTASONE) 20 MG tablet Take 2 tablets (40 mg total) by mouth daily. Patient not taking: Reported on 06/29/2014 06/21/14   Emilia Beck, PA-C  predniSONE (DELTASONE) 20 MG tablet Take 3 daily for 5 days, 2 daily for 5 days, 1 daily for 5 days. 08/14/14   Reuben Likes, MD  trimethoprim-polymyxin b (POLYTRIM) ophthalmic solution Place 1 drop into both eyes  every 4 (four) hours. Patient not taking: Reported on 06/21/2014 01/21/13   Hayden Rasmussen, NP    Family History History reviewed. No pertinent family history.  Social History Social History  Substance Use Topics  . Smoking status: Former Smoker    Packs/day: 0.50    Types: Cigarettes    Quit date: 12/26/2012  . Smokeless tobacco: Never Used  . Alcohol use No     Allergies   Patient has no known allergies.   Review of Systems Review of Systems  Constitutional: Positive for chills and fever.  HENT: Positive  for congestion and sore throat.   Respiratory: Positive for cough and wheezing.   Musculoskeletal: Positive for myalgias.  Neurological: Positive for headaches.  All other systems reviewed and are negative.    Physical Exam Updated Vital Signs BP 124/68 (BP Location: Left Arm)   Pulse 82   Temp 98.8 F (37.1 C) (Oral)   Resp 20   Ht 5\' 6"  (1.676 m)   Wt 170 lb (77.1 kg)   SpO2 96%   BMI 27.44 kg/m   Physical Exam  Constitutional: He is oriented to person, place, and time. He appears well-developed and well-nourished. No distress.  HENT:  Head: Normocephalic and atraumatic.  Right Ear: External ear normal.  Left Ear: External ear normal.  Mouth/Throat: Oropharynx is clear and moist.  Clear rhinorrhea, pharynx erythemous, uvula midline  Eyes: Conjunctivae are normal.  Neck: Normal range of motion. Neck supple.  No meningeal signs  Cardiovascular: Normal rate, regular rhythm and normal heart sounds.   Pulmonary/Chest: Effort normal and breath sounds normal. No respiratory distress. He has no wheezes. He has no rales.  Abdominal: Soft. Bowel sounds are normal. He exhibits no distension. There is no tenderness.  Musculoskeletal: He exhibits no edema or tenderness.  Lymphadenopathy:    He has no cervical adenopathy.  Neurological: He is alert and oriented to person, place, and time.  Skin: Skin is warm and dry. No erythema.  Psychiatric: He has a normal mood and affect.  Nursing note and vitals reviewed.    ED Treatments / Results  DIAGNOSTIC STUDIES:  Oxygen Saturation is 96% on RA, normal by my interpretation.    COORDINATION OF CARE:  8:36 PM Discussed treatment plan with pt at bedside and pt agreed to plan.   Labs (all labs ordered are listed, but only abnormal results are displayed) Labs Reviewed  RAPID STREP SCREEN (NOT AT [facility])  CULTURE, GROUP A STREP [facility])    EKG  EKG Interpretation None       Radiology Dg Chest 2 View  Result Date:  07/12/2016 CLINICAL DATA:  Cough and shortness of breath for 2-3 weeks. EXAM: CHEST  2 VIEW COMPARISON:  12/17/2014. FINDINGS: The lungs are clear wiithout focal pneumonia, edema, pneumothorax or pleural effusion. Hazy opacity in the right upper lung on the previous study has resolved in the interval. The cardiopericardial silhouette is within normal limits for size. The visualized bony structures of the thorax are intact. IMPRESSION: No active cardiopulmonary disease. Electronically Signed   By: Kennith Center M.D.   On: 07/12/2016 19:55    Procedures Procedures (including critical care time)  Medications Ordered in ED Medications  albuterol (PROVENTIL) (2.5 MG/3ML) 0.083% nebulizer solution (5 mg  Given 07/12/16 1929)     Initial Impression / Assessment and Plan / ED Course  I have reviewed the triage vital signs and the nursing notes.  Pertinent labs & imaging results that were available during my  care of the patient were reviewed by me and considered in my medical decision making (see chart for details).  Clinical Course     Patient emergency department with sore throat, subjective fever, cough, congestion, onset today. His son was diagnosed with strep throat this morning at pediatrician's office. Patient states he has had strep before and he feels the same. His rapid strep here is negative. His throat is erythematous, however no exudate, no evidence of peritonsillar or retropharyngeal abscess based on exam. He is in no acute distress. I did tell him I will give him prescription for amoxicillin but I told him to hold onto it and not fill unless history of constant back positive. He agreed. Patient apparently was wheezing upon arrival here, he was given a breathing treatment. On my exam his lungs are clear. His vital signs are normal. He is nontoxic-appearing. Suspect viral URI versus strep pharyngitis with asthma exacerbation. He is stable for outpatient treatment. Return precautions  discussed.   Vitals:   07/12/16 1922  BP: 124/68  Pulse: 82  Resp: 20  Temp: 98.8 F (37.1 C)  TempSrc: Oral  SpO2: 96%  Weight: 77.1 kg  Height: 5\' 6"  (1.676 m)    Final Clinical Impressions(s) / ED Diagnoses   Final diagnoses:  Upper respiratory tract infection, unspecified type    New Prescriptions New Prescriptions   AMOXICILLIN (AMOXIL) 500 MG CAPSULE    Take 1 capsule (500 mg total) by mouth 3 (three) times daily.   BENZONATATE (TESSALON) 100 MG CAPSULE    Take 1 capsule (100 mg total) by mouth every 8 (eight) hours.   I personally performed the services described in this documentation, which was scribed in my presence. The recorded information has been reviewed and is accurate.     Jaynie Crumbleatyana Raejean Swinford, PA-C 07/12/16 2050    Canary Brimhristopher J Tegeler, MD 07/12/16 2241

## 2016-07-12 NOTE — Discharge Instructions (Signed)
Your rapid strep today was negative. A chest x-ray is normal. Take Tylenol or Motrin for fever or pain. Saltwater gargles several times a day. Take an inhaler, 2 puffs every 4 hours for cough and wheezing. Take Tessalon for cough. If your rapid strep comes back positive on the culture, we will contact you. Follow with primary care doctor.

## 2016-07-12 NOTE — ED Notes (Signed)
See EDP assessment 

## 2016-07-12 NOTE — ED Notes (Signed)
Pt transported to xray 

## 2016-07-15 LAB — CULTURE, GROUP A STREP (THRC)

## 2017-03-16 ENCOUNTER — Emergency Department (HOSPITAL_COMMUNITY)
Admission: EM | Admit: 2017-03-16 | Discharge: 2017-03-16 | Disposition: A | Discharge status: Home / Self Care | Payer: Self-pay | Attending: Emergency Medicine | Admitting: Emergency Medicine

## 2017-03-16 ENCOUNTER — Encounter (HOSPITAL_COMMUNITY): Payer: Self-pay | Admitting: *Deleted

## 2017-03-16 ENCOUNTER — Emergency Department (HOSPITAL_COMMUNITY): Payer: Self-pay

## 2017-03-16 DIAGNOSIS — J4521 Mild intermittent asthma with (acute) exacerbation: Secondary | ICD-10-CM | POA: Insufficient documentation

## 2017-03-16 DIAGNOSIS — Z87891 Personal history of nicotine dependence: Secondary | ICD-10-CM | POA: Insufficient documentation

## 2017-03-16 MED ORDER — AEROCHAMBER PLUS FLO-VU MEDIUM MISC
1.0000 | Freq: Once | Status: AC
  Administered 2017-03-16: 1
  Filled 2017-03-16: qty 1

## 2017-03-16 MED ORDER — ALBUTEROL SULFATE (2.5 MG/3ML) 0.083% IN NEBU
2.5000 mg | INHALATION_SOLUTION | Freq: Once | RESPIRATORY_TRACT | Status: AC
  Administered 2017-03-16: 2.5 mg via RESPIRATORY_TRACT
  Filled 2017-03-16: qty 3

## 2017-03-16 MED ORDER — ALBUTEROL SULFATE HFA 108 (90 BASE) MCG/ACT IN AERS
1.0000 | INHALATION_SPRAY | RESPIRATORY_TRACT | Status: DC | PRN
  Administered 2017-03-16: 2 via RESPIRATORY_TRACT
  Filled 2017-03-16: qty 6.7

## 2017-03-16 MED ORDER — PREDNISONE 10 MG (21) PO TBPK: ORAL_TABLET | ORAL | 0 refills | Status: DC

## 2017-03-16 MED ORDER — ALBUTEROL SULFATE (2.5 MG/3ML) 0.083% IN NEBU
5.0000 mg | INHALATION_SOLUTION | Freq: Once | RESPIRATORY_TRACT | Status: AC
  Administered 2017-03-16: 5 mg via RESPIRATORY_TRACT
  Filled 2017-03-16: qty 6

## 2017-03-16 MED ORDER — METHYLPREDNISOLONE SODIUM SUCC 125 MG IJ SOLR
125.0000 mg | Freq: Once | INTRAMUSCULAR | Status: AC
  Administered 2017-03-16: 125 mg via INTRAVENOUS
  Filled 2017-03-16: qty 2

## 2017-03-16 MED ORDER — IPRATROPIUM-ALBUTEROL 0.5-2.5 (3) MG/3ML IN SOLN
3.0000 mL | Freq: Once | RESPIRATORY_TRACT | Status: AC
  Administered 2017-03-16: 3 mL via RESPIRATORY_TRACT
  Filled 2017-03-16: qty 3

## 2017-03-16 MED ORDER — ALBUTEROL SULFATE HFA 108 (90 BASE) MCG/ACT IN AERS: 2.0000 | INHALATION_SPRAY | Freq: Four times a day (QID) | RESPIRATORY_TRACT | 1 refills | Status: DC | PRN

## 2017-03-16 NOTE — ED Provider Notes (Signed)
[facility]-EMERGENCY DEPT Provider Note   CSN: [id number] Arrival date & time: 03/16/17  1146     History   Chief Complaint Chief Complaint  Patient presents with  . Shortness of Breath    HPI Jesus Haynes is a 41 y.o. male.  Pt presents to the ED today with sob.  Pt has a hx of asthma, but does not usually need an inhaler.  He's been using his friend's inhaler, which has helped, but did not make him fully back to normal.  The pt does not smoke, but is occasionally around people who smoke at work (he works [employer] representative).  He does not have a pcp or an inhaler of his own.   No f/c.      Past Medical History:  Diagnosis Date  . Asthma     There are no active problems to display for this patient.   History reviewed. No pertinent surgical history.     Home Medications    Prior to Admission medications   Medication Sig Start Date End Date Taking? Authorizing Provider  albuterol (PROVENTIL HFA;VENTOLIN HFA) 108 (90 Base) MCG/ACT inhaler Inhale 2 puffs into the lungs every 6 (six) hours as needed for wheezing or shortness of breath. 03/16/17   Jacalyn Lefevre, MD  predniSONE (STERAPRED UNI-PAK 21 TAB) 10 MG (21) TBPK tablet Take 6 tabs by mouth daily  for 2 days, then 5 tabs for 2 days, then 4 tabs for 2 days, then 3 tabs for 2 days, 2 tabs for 2 days, then 1 tab by mouth daily for 2 days 03/16/17   Jacalyn Lefevre, MD    Family History No family history on file.  Social History Social History  Substance Use Topics  . Smoking status: Former Smoker    Packs/day: 0.50    Types: Cigarettes    Quit date: 12/26/2012  . Smokeless tobacco: Never Used  . Alcohol use No     Allergies   Patient has no known allergies.   Review of Systems Review of Systems  Respiratory: Positive for cough, shortness of breath and wheezing.   All other systems reviewed and are negative.    Physical Exam Updated Vital Signs BP (!) 131/91 (BP Location: Right Arm)   Pulse 83   Temp 97.6  F (36.4 C) (Oral)   Resp 16   Ht  (1.676 m)   Wt 81.6 kg (180 lb)   SpO2 97%   BMI 29.05 kg/m   Physical Exam  Constitutional: He is oriented to person, place, and time. He appears well-developed and well-nourished.  HENT:  Head: Normocephalic and atraumatic.  Right Ear: External ear normal.  Left Ear: External ear normal.  Nose: Nose normal.  Mouth/Throat: Oropharynx is clear and moist.  Eyes: Pupils are equal, round, and reactive to light. Conjunctivae and EOM are normal.  Neck: Normal range of motion. Neck supple.  Cardiovascular: Normal rate, regular rhythm, normal heart sounds and intact distal pulses.   Pulmonary/Chest: He is in respiratory distress. He has wheezes.  Abdominal: Soft. Bowel sounds are normal.  Musculoskeletal: Normal range of motion.  Neurological: He is alert and oriented to person, place, and time.  Skin: Skin is warm.  Psychiatric: He has a normal mood and affect. His behavior is normal. Judgment and thought content normal.  Nursing note and vitals reviewed.    ED Treatments / Results  Labs (all labs ordered are listed, but only abnormal results are displayed) Labs Reviewed - No data to display  EKG  EKG Interpretation None       Radiology Dg Chest 2 View  Result Date: 03/16/2017 CLINICAL DATA:  41 year old male with a history of shortness of breath EXAM: CHEST  2 VIEW COMPARISON:  07/12/2016 FINDINGS: Cardiomediastinal silhouette within normal limits. No evidence of central vascular congestion. No pneumothorax or pleural effusion. No confluent airspace disease. No displaced fracture. IMPRESSION: No radiographic evidence of acute cardiopulmonary disease Electronically Signed   By: Gilmer Mor D.O.   On: 03/16/2017 12:44    Procedures Procedures (including critical care time)  Medications Ordered in ED Medications  albuterol (PROVENTIL HFA;VENTOLIN HFA) 108 (90 Base) MCG/ACT inhaler 1-2 puff (2 puffs Inhalation Given 03/16/17 1211)    albuterol (PROVENTIL) (2.5 MG/3ML) 0.083% nebulizer solution 5 mg (5 mg Nebulization Given 03/16/17 1209)  ipratropium-albuterol (DUONEB) 0.5-2.5 (3) MG/3ML nebulizer solution 3 mL (3 mLs Nebulization Given 03/16/17 1209)  methylPREDNISolone sodium succinate (SOLU-MEDROL) 125 mg/2 mL injection 125 mg (125 mg Intravenous Given 03/16/17 1216)  AEROCHAMBER PLUS FLO-VU MEDIUM MISC 1 each (1 each Other Given 03/16/17 1211)  albuterol (PROVENTIL) (2.5 MG/3ML) 0.083% nebulizer solution 2.5 mg (2.5 mg Nebulization Given 03/16/17 1305)     Initial Impression / Assessment and Plan / ED Course  I have reviewed the triage vital signs and the nursing notes.  Pertinent labs & imaging results that were available during my care of the patient were reviewed by me and considered in my medical decision making (see chart for details).    Pt is feeling better after nebs and solumedrol.  The pt to be d/c home and to return if worse.  He is given the number for [facility] to establish primary care.  Final Clinical Impressions(s) / ED Diagnoses   Final diagnoses:  Mild intermittent asthma with exacerbation    New Prescriptions New Prescriptions   PREDNISONE (STERAPRED UNI-PAK 21 TAB) 10 MG (21) TBPK TABLET    Take 6 tabs by mouth daily  for 2 days, then 5 tabs for 2 days, then 4 tabs for 2 days, then 3 tabs for 2 days, 2 tabs for 2 days, then 1 tab by mouth daily for 2 days     Jacalyn Lefevre, MD 03/16/17 1308

## 2017-03-16 NOTE — ED Triage Notes (Signed)
Pt reports on and off shortness of breath for the past 2-3 weeks.  Pt reports hx of asthma but it doesn't usually cause problems.  Pt had been using a friend's inhaler but didn't feel like it adequately cleared out his breathing.  Pt has bilateral wheezes in lungs in triage. Pt a/o x 4 and ambulatory.

## 2017-03-18 ENCOUNTER — Telehealth: Payer: Self-pay | Admitting: Emergency Medicine

## 2017-03-18 NOTE — Telephone Encounter (Signed)
Received a call from Tadion at [facility] stating the prednisone was ordered for a 21 tablet 6 day dose pack but with instructions for a 12 day dose pack and with 42 tablets instead of 48.  Spoke with Dr. Rubin Payor who reviewed chart and stated to use the 12 day dose pack.  Returned Tadion's call and advised him of the correct number of days and pills for the prescription.  No further CM needs noted at this time.

## 2017-07-18 ENCOUNTER — Emergency Department (HOSPITAL_COMMUNITY): Payer: Self-pay

## 2017-07-18 ENCOUNTER — Other Ambulatory Visit: Payer: Self-pay

## 2017-07-18 ENCOUNTER — Encounter (HOSPITAL_COMMUNITY): Payer: Self-pay

## 2017-07-18 ENCOUNTER — Emergency Department (HOSPITAL_COMMUNITY)
Admission: EM | Admit: 2017-07-18 | Discharge: 2017-07-19 | Disposition: A | Discharge status: Home / Self Care | Payer: Self-pay | Attending: Emergency Medicine | Admitting: Emergency Medicine

## 2017-07-18 DIAGNOSIS — Z87891 Personal history of nicotine dependence: Secondary | ICD-10-CM | POA: Insufficient documentation

## 2017-07-18 DIAGNOSIS — Z79899 Other long term (current) drug therapy: Secondary | ICD-10-CM | POA: Insufficient documentation

## 2017-07-18 DIAGNOSIS — J111 Influenza due to unidentified influenza virus with other respiratory manifestations: Secondary | ICD-10-CM | POA: Insufficient documentation

## 2017-07-18 DIAGNOSIS — J45909 Unspecified asthma, uncomplicated: Secondary | ICD-10-CM | POA: Insufficient documentation

## 2017-07-18 LAB — CBC
HCT: 43 % (ref 39.0–52.0)
HEMOGLOBIN: 14.4 g/dL (ref 13.0–17.0)
MCH: 29 pg (ref 26.0–34.0)
MCHC: 33.5 g/dL (ref 30.0–36.0)
MCV: 86.7 fL (ref 78.0–100.0)
Platelets: 169 10*3/uL (ref 150–400)
RBC: 4.96 MIL/uL (ref 4.22–5.81)
RDW: 13.7 % (ref 11.5–15.5)
WBC: 6.2 10*3/uL (ref 4.0–10.5)

## 2017-07-18 LAB — BASIC METABOLIC PANEL
ANION GAP: 11 (ref 5–15)
BUN: 11 mg/dL (ref 6–20)
CHLORIDE: 103 mmol/L (ref 101–111)
CO2: 23 mmol/L (ref 22–32)
CREATININE: 0.91 mg/dL (ref 0.61–1.24)
Calcium: 9.1 mg/dL (ref 8.9–10.3)
GFR calc non Af Amer: >60 mL/min (ref >60)
Glucose, Bld: 108 mg/dL — ABNORMAL HIGH (ref 65–99)
Potassium: 3.9 mmol/L (ref 3.5–5.1)
Sodium: 137 mmol/L (ref 135–145)

## 2017-07-18 MED ORDER — ONDANSETRON HCL 4 MG/2ML IJ SOLN
4.0000 mg | Freq: Once | INTRAMUSCULAR | Status: AC
  Administered 2017-07-18: 4 mg via INTRAVENOUS
  Filled 2017-07-18: qty 2

## 2017-07-18 MED ORDER — IPRATROPIUM-ALBUTEROL 0.5-2.5 (3) MG/3ML IN SOLN
3.0000 mL | Freq: Once | RESPIRATORY_TRACT | Status: AC
  Administered 2017-07-18: 3 mL via RESPIRATORY_TRACT
  Filled 2017-07-18: qty 3

## 2017-07-18 MED ORDER — SODIUM CHLORIDE 0.9 % IV BOLUS (SEPSIS)
500.0000 mL | Freq: Once | INTRAVENOUS | Status: AC
  Administered 2017-07-18: 500 mL via INTRAVENOUS

## 2017-07-18 MED ORDER — ALBUTEROL SULFATE (2.5 MG/3ML) 0.083% IN NEBU
5.0000 mg | INHALATION_SOLUTION | Freq: Once | RESPIRATORY_TRACT | Status: AC
  Administered 2017-07-18: 5 mg via RESPIRATORY_TRACT
  Filled 2017-07-18: qty 6

## 2017-07-18 MED ORDER — PREDNISONE 20 MG PO TABS
60.0000 mg | ORAL_TABLET | Freq: Once | ORAL | Status: AC
  Administered 2017-07-18: 60 mg via ORAL
  Filled 2017-07-18: qty 3

## 2017-07-18 MED ORDER — IBUPROFEN 800 MG PO TABS
800.0000 mg | ORAL_TABLET | Freq: Once | ORAL | Status: AC
  Administered 2017-07-18: 800 mg via ORAL
  Filled 2017-07-18: qty 1

## 2017-07-18 MED ORDER — ACETAMINOPHEN 325 MG PO TABS
650.0000 mg | ORAL_TABLET | Freq: Once | ORAL | Status: AC | PRN
  Administered 2017-07-18: 650 mg via ORAL
  Filled 2017-07-18: qty 2

## 2017-07-18 MED ORDER — ALBUTEROL (5 MG/ML) CONTINUOUS INHALATION SOLN
10.0000 mg/h | INHALATION_SOLUTION | Freq: Once | RESPIRATORY_TRACT | Status: AC
  Administered 2017-07-18: 10 mg/h via RESPIRATORY_TRACT
  Filled 2017-07-18: qty 20

## 2017-07-18 NOTE — ED Notes (Signed)
"  Breathing easier", calmer, "feel a little better" post neb.

## 2017-07-18 NOTE — ED Triage Notes (Signed)
Pt reports he has had coughing, sore throat, and feels short of breath. Pt speaking in full sentences. Temp 100.3 in triage.

## 2017-07-18 NOTE — ED Notes (Signed)
Alert, NAD, calm, interactive, resps e/u, speaking in clear complete sentences, no dyspnea noted, skin W&D, VSS, c/o body aches, CP, sob, cough, sore throat, dizziness (denies: NVD).

## 2017-07-18 NOTE — ED Notes (Signed)
EDPA into room, prior to RN assessment, see PA notes, pending orders.   

## 2017-07-18 NOTE — ED Notes (Signed)
Some expiratory wheezing noted, will administer breathing tx.

## 2017-07-18 NOTE — ED Provider Notes (Signed)
[facility] Provider Note   CSN: [id number] Arrival date & time: 07/18/17  1833     History   Chief Complaint Chief Complaint  Patient presents with  . Cough    HPI Jesus Haynes is a 42 y.o. male with hx of asthma who presents to the ED with cough, wheezing and flu like symptoms. The cough and wheezing started yesterday. Patient reports sore throat, fever, chills, chest hurts with coughing. Patient states he is aching all over.   No language interpreter was used.  Cough  This is a new problem. The current episode started yesterday. The cough is non-productive. The maximum temperature recorded prior to his arrival was 101 to 101.9 F. Associated symptoms include chills, sweats, headaches, sore throat, myalgias, shortness of breath and wheezing. Pertinent negatives include no ear pain. Chest pain: with cough. He has tried nothing for the symptoms. He is not a smoker. His past medical history is significant for asthma.    Past Medical History:  Diagnosis Date  . Asthma     There are no active problems to display for this patient.   History reviewed. No pertinent surgical history.     Home Medications    Prior to Admission medications   Medication Sig Start Date End Date Taking? Authorizing Provider  albuterol (PROVENTIL HFA;VENTOLIN HFA) 108 (90 Base) MCG/ACT inhaler Inhale 2 puffs into the lungs every 6 (six) hours as needed for wheezing or shortness of breath. 03/16/17   Jacalyn LefevreHaviland, Julie, MD  predniSONE (STERAPRED UNI-PAK 21 TAB) 10 MG (21) TBPK tablet Take 6 tabs by mouth daily  for 2 days, then 5 tabs for 2 days, then 4 tabs for 2 days, then 3 tabs for 2 days, 2 tabs for 2 days, then 1 tab by mouth daily for 2 days 03/16/17   Jacalyn LefevreHaviland, Julie, MD    Family History History reviewed. No pertinent family history.  Social History Social History   Tobacco Use  . Smoking status: Former Smoker    Packs/day: 0.50    Types: Cigarettes      Last attempt to quit: 12/26/2012    Years since quitting: 4.5  . Smokeless tobacco: Never Used  Substance Use Topics  . Alcohol use: No  . Drug use: No     Allergies   Patient has no known allergies.   Review of Systems Review of Systems  Constitutional: Positive for chills and fever.  HENT: Positive for congestion, sinus pressure, sinus pain and sore throat. Negative for ear pain and trouble swallowing.   Respiratory: Positive for cough, shortness of breath and wheezing.   Cardiovascular: Chest pain: with cough.  Gastrointestinal: Negative for abdominal pain, diarrhea, nausea and vomiting.  Genitourinary: Negative for dysuria, frequency and urgency.  Musculoskeletal: Positive for myalgias. Negative for back pain.  Skin: Negative for rash.  Neurological: Positive for headaches. Negative for syncope.  Psychiatric/Behavioral: Negative for confusion.     Physical Exam Updated Vital Signs BP 121/75   Pulse (!) 101   Temp (!) 100.7 F (38.2 C) (Oral)   Resp 16   SpO2 97%   Physical Exam  Constitutional: He appears well-developed and well-nourished. No distress.  HENT:  Head: Normocephalic and atraumatic.  Nose: Rhinorrhea present.  Mouth/Throat: Uvula is midline, oropharynx is clear and moist and mucous membranes are normal.  Eyes: EOM are normal. Pupils are equal, round, and reactive to light. Right conjunctiva is injected. Left conjunctiva is injected.  Neck: Normal range of  motion. Neck supple.  Cardiovascular: Regular rhythm. Tachycardia present.  Pulmonary/Chest: Effort normal. No respiratory distress. He has decreased breath sounds. He has wheezes.  Inspiratory and exploratory wheezing bilateral.  Abdominal: Soft. There is no tenderness.  Musculoskeletal: Normal range of motion.  Lymphadenopathy:    He has no cervical adenopathy.  Neurological: He is alert.  Skin: Skin is warm and dry.  Psychiatric: He has a normal mood and affect.  Nursing note and vitals  reviewed.    ED Treatments / Results  Labs (all labs ordered are listed, but only abnormal results are displayed) Labs Reviewed  BASIC METABOLIC PANEL - Abnormal; Notable for the following components:      Result Value   Glucose, Bld 108 (*)    All other components within normal limits  CBC  INFLUENZA PANEL BY PCR (TYPE A & B)   Radiology Dg Chest 2 View  Result Date: 07/18/2017 CLINICAL DATA:  Shortness of breath for 24 hours EXAM: CHEST  2 VIEW COMPARISON:  March 16, 2017 FINDINGS: The heart size and mediastinal contours are within normal limits. There is no focal infiltrate, pulmonary edema, or pleural effusion. The visualized skeletal structures are unremarkable. IMPRESSION: No active cardiopulmonary disease. Electronically Signed   By: Sherian Rein M.D.   On: 07/18/2017 19:51    Procedures Procedures (including critical care time)  Medications Ordered in ED Medications  acetaminophen (TYLENOL) tablet 650 mg (650 mg Oral Given 07/18/17 1845)  albuterol (PROVENTIL) (2.5 MG/3ML) 0.083% nebulizer solution 5 mg (5 mg Nebulization Given 07/18/17 1850)  ipratropium-albuterol (DUONEB) 0.5-2.5 (3) MG/3ML nebulizer solution 3 mL (3 mLs Nebulization Given 07/18/17 2028)  predniSONE (DELTASONE) tablet 60 mg (60 mg Oral Given 07/18/17 2028)  ondansetron (ZOFRAN) injection 4 mg (4 mg Intravenous Given 07/18/17 2027)  ibuprofen (ADVIL,MOTRIN) tablet 800 mg (800 mg Oral Given 07/18/17 2028)  sodium chloride 0.9 % bolus 500 mL (0 mLs Intravenous Stopped 07/18/17 2123)  albuterol (PROVENTIL) (2.5 MG/3ML) 0.083% nebulizer solution 5 mg (5 mg Nebulization Given 07/18/17 2133)     Initial Impression / Assessment and Plan / ED Course  I have reviewed the triage vital signs and the nursing notes. 42 y.o. male with fever, cough, wheezing, shortness of breath and flu-like symptoms. Influenza test pending. Patient's 02 SAT dropped to 87%. Will  Have respiratory start continuous neb treatment.  Care  turned over to [facility], [facility] @ 10:00 pm  Final Clinical Impressions(s) / ED Diagnoses    ED Discharge Orders    None       Kerrie Buffalo [place], [place] 07/18/17 2201    Bethann Berkshire, MD 07/18/17 [phone]

## 2017-07-18 NOTE — ED Triage Notes (Signed)
[facility] EMT went to take pt to waiting room, pt states he feels like he needs another treatment. This RN went into room to evaluate, pt is resting comfortably in the chair, I listen to pt lung sounds, mild wheezing noted. As soon as I told pt he can go back to waiting area he began to hyperventilate... Pt in waiting area resting at this time.

## 2017-07-19 LAB — INFLUENZA PANEL BY PCR (TYPE A & B)
INFLAPCR: POSITIVE — AB
Influenza B By PCR: NEGATIVE

## 2017-07-19 MED ORDER — IBUPROFEN 800 MG PO TABS: 800.0000 mg | ORAL_TABLET | Freq: Three times a day (TID) | ORAL | 0 refills | Status: DC

## 2017-07-19 MED ORDER — PREDNISONE 20 MG PO TABS: 60.0000 mg | ORAL_TABLET | Freq: Every day | ORAL | 0 refills | Status: DC

## 2017-07-19 MED ORDER — OSELTAMIVIR PHOSPHATE 75 MG PO CAPS: 75.0000 mg | ORAL_CAPSULE | Freq: Two times a day (BID) | ORAL | 0 refills | Status: DC

## 2017-07-19 MED ORDER — ALBUTEROL SULFATE HFA 108 (90 BASE) MCG/ACT IN AERS
2.0000 | INHALATION_SPRAY | Freq: Once | RESPIRATORY_TRACT | Status: AC
  Administered 2017-07-19: 2 via RESPIRATORY_TRACT
  Filled 2017-07-19: qty 6.7

## 2017-07-19 NOTE — ED Notes (Signed)
Pt ambulated, pt stats dropped as low as 88% up to 92%

## 2017-07-19 NOTE — ED Provider Notes (Signed)
Care [place] from previous provider [place]. Please see note for further details. Case discussed, plan agreed upon. Briefly, patient is a 42 y.o. male with hx of asthma who presented to ED for cough, wheezing, fever, body aches. Given steroids and duonebs already with some improvement. About to start continuous neb treatment. CXR negative for pna. Flu pending.Will re-evaluate following continuous neb with admit / discharge based on response to treatment.   Flu +. Patient re-evaluated after neb. Now on room air and resting comfortably. O2 did drop to 89% with ambulation. Nursing staff who ambulated patient stated that he walked briskly and did not stop for air. He did not feel short of breath when he returned.  Discussed admission for continuous treatment given drop in O2.  Patient reports that he actually feels much better and would like to go home. We spoke at length about having low threshold to return if symptoms worsen or feels short of breath. Patient aware that he can return at any time for further treatment. Will start on tamiflu and steroid burst. Inhaler provided in ED. Again discussed return precautions. All questions answered.    Alexianna Nachreiner, Chase PicketJaime Pilcher, PA-C 07/19/17 0102    Eber HongMiller, Brian, MD 07/19/17 [phone]

## 2017-07-19 NOTE — Discharge Instructions (Signed)
It was my pleasure taking care of you today!  Take Tamiflu and steroids as directed. Use inhaler 2 puffs ever 4 hours.  Alternate between Tylenol and ibuprofen as needed for body aches / fevers. Rest, drink plenty of fluids to stay hydrated. Wash your hands often.  Follow up with your doctor in regards to your hospital visit. Return to the emergency department if symptoms worsen, become progressive, or become more concerning. Please have a low threshold to return if you feel as if you are getting worse instead of better!

## 2017-07-19 NOTE — ED Notes (Signed)
PT states understanding of care given, follow up care, and medication prescribed. PT ambulated from ED to car with a steady gait. 

## 2017-08-08 ENCOUNTER — Emergency Department (HOSPITAL_COMMUNITY)
Admission: EM | Admit: 2017-08-08 | Discharge: 2017-08-08 | Disposition: A | Discharge status: Home / Self Care | Payer: Medicaid Other | Attending: Emergency Medicine | Admitting: Emergency Medicine

## 2017-08-08 ENCOUNTER — Other Ambulatory Visit: Payer: Self-pay

## 2017-08-08 ENCOUNTER — Encounter (HOSPITAL_COMMUNITY): Payer: Self-pay | Admitting: Emergency Medicine

## 2017-08-08 ENCOUNTER — Emergency Department (HOSPITAL_COMMUNITY): Payer: Medicaid Other

## 2017-08-08 DIAGNOSIS — J069 Acute upper respiratory infection, unspecified: Secondary | ICD-10-CM | POA: Insufficient documentation

## 2017-08-08 DIAGNOSIS — J4521 Mild intermittent asthma with (acute) exacerbation: Secondary | ICD-10-CM | POA: Insufficient documentation

## 2017-08-08 DIAGNOSIS — Z87891 Personal history of nicotine dependence: Secondary | ICD-10-CM | POA: Insufficient documentation

## 2017-08-08 LAB — I-STAT CHEM 8, ED
BUN: 11 mg/dL (ref 6–20)
Calcium, Ion: 1.16 mmol/L (ref 1.15–1.40)
Chloride: 104 mmol/L (ref 101–111)
Creatinine, Ser: 0.8 mg/dL (ref 0.61–1.24)
Glucose, Bld: 102 mg/dL — ABNORMAL HIGH (ref 65–99)
HCT: 43 % (ref 39.0–52.0)
Hemoglobin: 14.6 g/dL (ref 13.0–17.0)
Potassium: 3.7 mmol/L (ref 3.5–5.1)
Sodium: 142 mmol/L (ref 135–145)
TCO2: 25 mmol/L (ref 22–32)

## 2017-08-08 MED ORDER — ALBUTEROL SULFATE HFA 108 (90 BASE) MCG/ACT IN AERS: 1.0000 | INHALATION_SPRAY | Freq: Four times a day (QID) | RESPIRATORY_TRACT | 0 refills | Status: DC | PRN

## 2017-08-08 MED ORDER — SODIUM CHLORIDE 0.9 % IJ SOLN
INTRAMUSCULAR | Status: AC
  Administered 2017-08-08: 19:00:00
  Filled 2017-08-08: qty 50

## 2017-08-08 MED ORDER — BENZONATATE 100 MG PO CAPS: 100.0000 mg | ORAL_CAPSULE | Freq: Three times a day (TID) | ORAL | 0 refills | Status: DC

## 2017-08-08 MED ORDER — IOPAMIDOL (ISOVUE-300) INJECTION 61%
INTRAVENOUS | Status: AC
  Administered 2017-08-08: 75 mL
  Filled 2017-08-08: qty 75

## 2017-08-08 MED ORDER — IPRATROPIUM-ALBUTEROL 0.5-2.5 (3) MG/3ML IN SOLN
3.0000 mL | Freq: Once | RESPIRATORY_TRACT | Status: AC
  Administered 2017-08-08: 3 mL via RESPIRATORY_TRACT
  Filled 2017-08-08: qty 3

## 2017-08-08 MED ORDER — DEXAMETHASONE SODIUM PHOSPHATE 10 MG/ML IJ SOLN
10.0000 mg | Freq: Once | INTRAMUSCULAR | Status: AC
  Administered 2017-08-08: 10 mg via INTRAMUSCULAR
  Filled 2017-08-08: qty 1

## 2017-08-08 MED ORDER — ALBUTEROL SULFATE HFA 108 (90 BASE) MCG/ACT IN AERS
1.0000 | INHALATION_SPRAY | Freq: Once | RESPIRATORY_TRACT | Status: AC
  Administered 2017-08-08: 1 via RESPIRATORY_TRACT
  Filled 2017-08-08: qty 6.7

## 2017-08-08 NOTE — ED Provider Notes (Signed)
[facility]-EMERGENCY DEPT Provider Note   CSN: [id number] Arrival date & time: 08/08/17  1301     History   Chief Complaint Chief Complaint  Patient presents with  . Cough    HPI Jesus Haynes is a 42 y.o. male with a past medical history of asthma, who presents to ED for evaluation of continued shortness of breath, wheezing and cough.  He was seen and evaluated and resulted with + influenza approximately 3 weeks ago.  He reports compliance with his steroids, inhalers at that time.  He has ran out of his inhaler for about a week and has had worsening shortness of breath and wheezing.  He reports a dry cough but denies any hemoptysis.  He also reports associated chest tightness from his wheezing.  Sick contacts at work with similar symptoms.  He denies any chest pain, history of MI, DVT or PE, nasal congestion or other URI symptoms.  He did not receive his influenza vaccine this year.  HPI  Past Medical History:  Diagnosis Date  . Asthma     There are no active problems to display for this patient.   History reviewed. No pertinent surgical history.     Home Medications    Prior to Admission medications   Medication Sig Start Date End Date Taking? Authorizing Provider  ibuprofen (ADVIL,MOTRIN) 800 MG tablet Take 1 tablet (800 mg total) by mouth 3 (three) times daily. 07/19/17  Yes Ward, Chase Picket, PA-C  oseltamivir (TAMIFLU) 75 MG capsule Take 1 capsule (75 mg total) by mouth every 12 (twelve) hours. 07/19/17  Yes Ward, Chase Picket, PA-C  predniSONE (DELTASONE) 20 MG tablet Take 3 tablets (60 mg total) by mouth daily. 07/19/17  Yes Ward, Chase Picket, PA-C  albuterol (PROVENTIL HFA;VENTOLIN HFA) 108 (90 Base) MCG/ACT inhaler Inhale 1-2 puffs into the lungs every 6 (six) hours as needed for wheezing or shortness of breath. 08/08/17   Ketina Mars, PA-C  benzonatate (TESSALON) 100 MG capsule Take 1 capsule (100 mg total) by mouth every 8 (eight) hours.  08/08/17   Dietrich Pates, PA-C    Family History No family history on file.  Social History Social History   Tobacco Use  . Smoking status: Former Smoker    Packs/day: 0.50    Types: Cigarettes    Last attempt to quit: 12/26/2012    Years since quitting: 4.6  . Smokeless tobacco: Never Used  Substance Use Topics  . Alcohol use: No  . Drug use: No     Allergies   Patient has no known allergies.   Review of Systems Review of Systems  Constitutional: Negative for appetite change, chills and fever.  HENT: Negative for ear pain, rhinorrhea, sneezing and sore throat.   Eyes: Negative for photophobia and visual disturbance.  Respiratory: Positive for cough, chest tightness, shortness of breath and wheezing.   Cardiovascular: Negative for chest pain and palpitations.  Gastrointestinal: Negative for abdominal pain, blood in stool, constipation, diarrhea, nausea and vomiting.  Genitourinary: Negative for dysuria, hematuria and urgency.  Musculoskeletal: Negative for myalgias.  Skin: Negative for rash.  Neurological: Negative for dizziness, weakness and light-headedness.     Physical Exam Updated Vital Signs BP 117/69 (BP Location: Left Arm)   Pulse 85   Temp 98.5 F (36.9 C) (Oral)   Resp 16   SpO2 93%   Physical Exam  Constitutional: He appears well-developed and well-nourished. No distress.  HENT:  Head: Normocephalic and atraumatic.  Nose: Nose normal.  Postnasal drainage seen.  Eyes: Conjunctivae and EOM are normal. Left eye exhibits no discharge. No scleral icterus.  Neck: Normal range of motion. Neck supple.  Cardiovascular: Normal rate, regular rhythm, normal heart sounds and intact distal pulses. Exam reveals no gallop and no friction rub.  No murmur heard. Pulmonary/Chest: Effort normal. No respiratory distress. He has decreased breath sounds in the right middle field, the right lower field, the left middle field and the left lower field. He has wheezes in the  right middle field, the right lower field, the left middle field and the left lower field.  And expiratory wheezing in bilateral lower.  Speaking in complete sentences without difficulty.  Abdominal: Soft. Bowel sounds are normal. He exhibits no distension. There is no tenderness. There is no guarding.  Musculoskeletal: Normal range of motion. He exhibits no edema.  Neurological: He is alert. He exhibits normal muscle tone. Coordination normal.  Skin: Skin is warm and dry. No rash noted.  Psychiatric: He has a normal mood and affect.  Nursing note and vitals reviewed.    ED Treatments / Results  Labs (all labs ordered are listed, but only abnormal results are displayed) Labs Reviewed  I-STAT CHEM 8, ED - Abnormal; Notable for the following components:      Result Value   Glucose, Bld 102 (*)    All other components within normal limits    EKG  EKG Interpretation  Date/Time:  Thursday August 08 2017 13:16:06 EST Ventricular Rate:  88 PR Interval:    QRS Duration: 82 QT Interval:  340 QTC Calculation: 412 R Axis:   75 Text Interpretation:  Sinus rhythm since last tracing no significant change Confirmed by Rolan BuccoBelfi, Melanie [phone]([id number]) on 08/08/2017 1:23:16 PM       Radiology Dg Chest 2 View  Result Date: 08/08/2017 CLINICAL DATA:  Continued cough and shortness of breath posttreatment for flu 3 weeks ago. EXAM: CHEST  2 VIEW COMPARISON:  07/18/2017 FINDINGS: Cardiomediastinal silhouette is normal. Mediastinal contours appear intact. There is no evidence pleural effusion or pneumothorax. Vague soft tissue density overlies the right superior hemithorax, seen on the frontal view. Osseous structures are without acute abnormality. Soft tissues are grossly normal. IMPRESSION: Vague soft tissue density overlies the right superior hemithorax, seen on the frontal view. This may represent a pulmonary nodule or small focus of airspace consolidation. Follow-up may be obtained with contrast-enhanced  chest CT, or chest radiograph in a few weeks. Electronically Signed   By: Ted Mcalpineobrinka  Dimitrova M.D.   On: 08/08/2017 13:51   Ct Chest W Contrast  Result Date: 08/08/2017 CLINICAL DATA:  Cough and shortness of breath. EXAM: CT CHEST WITH CONTRAST TECHNIQUE: Multidetector CT imaging of the chest was performed during intravenous contrast administration. CONTRAST:  75mL ISOVUE-300 IOPAMIDOL (ISOVUE-300) INJECTION 61% COMPARISON:  Chest x-ray August 08, 2017 FINDINGS: Cardiovascular: The thoracic aorta is normal in caliber without dissection or atherosclerotic change. The heart size is normal. Coronary arteries are grossly unremarkable on limited images. The central pulmonary arteries are normal in appearance. Mediastinum/Nodes: No enlarged mediastinal, hilar, or axillary lymph nodes. Thyroid gland, trachea, and esophagus demonstrate no significant findings. Lungs/Pleura: The possible nodular density or focal infiltrate projected over the right upper lobe on the chest x-ray from earlier today is not seen on the scout view. It is possible this represented confluence of shadows or possibly an EKG lead overlying some ribs. Central airways are normal. Scattered dependent atelectasis. Mild atelectasis in the lingula. No suspicious nodules or masses.  Upper Abdomen: No acute abnormality. Musculoskeletal: No chest wall abnormality. No acute or significant osseous findings. IMPRESSION: 1. Bibasilar dependent atelectasis. No evidence of pneumonia. No other cause for the patient's symptoms identified. Electronically Signed   By: Gerome Sam III M.D   On: 08/08/2017 19:19    Procedures Procedures (including critical care time)  Medications Ordered in ED Medications  albuterol (PROVENTIL HFA;VENTOLIN HFA) 108 (90 Base) MCG/ACT inhaler 1 puff (not administered)  ipratropium-albuterol (DUONEB) 0.5-2.5 (3) MG/3ML nebulizer solution 3 mL (3 mLs Nebulization Given 08/08/17 1627)  dexamethasone (DECADRON) injection 10 mg (10  mg Intramuscular Given 08/08/17 1627)  iopamidol (ISOVUE-300) 61 % injection (75 mLs  Contrast Given 08/08/17 1847)  sodium chloride 0.9 % injection (  Given 08/08/17 1915)     Initial Impression / Assessment and Plan / ED Course  I have reviewed the triage vital signs and the nursing notes.  Pertinent labs & imaging results that were available during my care of the patient were reviewed by me and considered in my medical decision making (see chart for details).     Patient, with past medical history of asthma, who presents to ED for evaluation of continued shortness of breath, wheezing and cough.  He was seen and evaluated for influenza approximately 3 weeks ago.  He reports compliance with his steroids and inhalers at that time.  On initial evaluation patient did have diffuse wheezing and chest tightness noted.  His chest x-ray showed possible nodule or infectious process.  He was satting at 93% on room air before breathing treatment was given.  They did recommend a CT with contrast.  I considered the fact that he is continuing to have symptoms and thought it would be best to get the chest CT at this time.  This returned as negative for acute abnormality.  Was negative for nodules as well.  He reports complete resolution of his symptoms with breathing treatment given here in the ED.  He remains in no acute distress and in no respiratory distress.  I suspect asthma exacerbation as the cause of his symptoms.  Low suspicion for ACS, PE or other emergent process.  Will give patient Decadron IM, inhaler as well as refills and Tessalon Perles as needed for cough.  Advised to follow-up with wellness [facility] for further evaluation.  Patient appears stable for discharge at this time.  Strict return precautions given. Portions of this note were generated with Scientist, clinical (histocompatibility and immunogenetics). Dictation errors may occur despite best attempts at proofreading.   Final Clinical Impressions(s) / ED Diagnoses   Final  diagnoses:  Acute upper respiratory infection  Mild intermittent asthma with exacerbation    ED Discharge Orders        Ordered    benzonatate (TESSALON) 100 MG capsule  Every 8 hours     08/08/17 1927    albuterol (PROVENTIL HFA;VENTOLIN HFA) 108 (90 Base) MCG/ACT inhaler  Every 6 hours PRN     08/08/17 1927       Dietrich Pates, PA-C 08/08/17 1932    Raeford Razor, MD 08/09/17 [phone]

## 2017-08-08 NOTE — ED Triage Notes (Signed)
Pt complaint of continued cough and SOB post treatment for flu.

## 2018-02-23 ENCOUNTER — Emergency Department (HOSPITAL_COMMUNITY)
Admission: EM | Admit: 2018-02-23 | Discharge: 2018-02-23 | Disposition: A | Discharge status: Home / Self Care | Payer: Medicaid Other | Attending: Emergency Medicine | Admitting: Emergency Medicine

## 2018-02-23 ENCOUNTER — Other Ambulatory Visit: Payer: Self-pay

## 2018-02-23 ENCOUNTER — Encounter (HOSPITAL_COMMUNITY): Payer: Self-pay

## 2018-02-23 DIAGNOSIS — J4521 Mild intermittent asthma with (acute) exacerbation: Secondary | ICD-10-CM | POA: Diagnosis not present

## 2018-02-23 DIAGNOSIS — R0602 Shortness of breath: Secondary | ICD-10-CM | POA: Diagnosis present

## 2018-02-23 DIAGNOSIS — Z87891 Personal history of nicotine dependence: Secondary | ICD-10-CM | POA: Insufficient documentation

## 2018-02-23 MED ORDER — ALBUTEROL SULFATE HFA 108 (90 BASE) MCG/ACT IN AERS
2.0000 | INHALATION_SPRAY | Freq: Once | RESPIRATORY_TRACT | Status: AC
  Administered 2018-02-23: 2 via RESPIRATORY_TRACT
  Filled 2018-02-23: qty 6.7

## 2018-02-23 MED ORDER — FLUTICASONE PROPIONATE 50 MCG/ACT NA SUSP
1.0000 | Freq: Every day | NASAL | 0 refills | Status: DC
Start: 2018-02-23 — End: 2023-12-18

## 2018-02-23 MED ORDER — CETIRIZINE HCL 10 MG PO TABS: 10.0000 mg | ORAL_TABLET | Freq: Every day | ORAL | 0 refills | Status: DC

## 2018-02-23 MED ORDER — IPRATROPIUM-ALBUTEROL 0.5-2.5 (3) MG/3ML IN SOLN
3.0000 mL | Freq: Once | RESPIRATORY_TRACT | Status: AC
  Administered 2018-02-23: 3 mL via RESPIRATORY_TRACT
  Filled 2018-02-23: qty 3

## 2018-02-23 MED ORDER — ALBUTEROL SULFATE (2.5 MG/3ML) 0.083% IN NEBU
5.0000 mg | INHALATION_SOLUTION | Freq: Once | RESPIRATORY_TRACT | Status: AC
  Administered 2018-02-23: 5 mg via RESPIRATORY_TRACT
  Filled 2018-02-23: qty 6

## 2018-02-23 MED ORDER — DEXAMETHASONE 4 MG PO TABS
10.0000 mg | ORAL_TABLET | Freq: Once | ORAL | Status: AC
  Administered 2018-02-23: 10 mg via ORAL
  Filled 2018-02-23: qty 3

## 2018-02-23 NOTE — ED Notes (Signed)
ED Provider at bedside. 

## 2018-02-23 NOTE — ED Provider Notes (Signed)
[facility] EMERGENCY DEPARTMENT Provider Note   CSN: [id number] Arrival date & time: 02/23/18  1209     History   Chief Complaint Chief Complaint  Patient presents with  . Asthma    HPI Jesus Haynes is a 42 y.o. male with a history of asthma who presents emergency department today for asthma exacerbation.  Patient reports he ran out of his albuterol inhaler approximately 1 week ago.  Since that time he has been dealing with allergies that usually exacerbate his asthma.  He reports he has episodes of shortness of breath and chest tightness.  He reports intermittent dry cough that is nonproductive.  He received a breathing treatment in triage that relieved his symptoms.  He denies any associated fever, chills, sinus pressure, chest pain, nausea, diaphoresis, hemoptysis, lower leg swelling, recent surgery, recent travel, testosterone use, history of PE/DVT, history of CHF, orthopnea, or paroxysmal nocturnal dyspnea.  HPI  Past Medical History:  Diagnosis Date  . Asthma     There are no active problems to display for this patient.   History reviewed. No pertinent surgical history.      Home Medications    Prior to Admission medications   Medication Sig Start Date End Date Taking? Authorizing Provider  albuterol (PROVENTIL HFA;VENTOLIN HFA) 108 (90 Base) MCG/ACT inhaler Inhale 1-2 puffs into the lungs every 6 (six) hours as needed for wheezing or shortness of breath. 08/08/17   Khatri, Hina, PA-C  benzonatate (TESSALON) 100 MG capsule Take 1 capsule (100 mg total) by mouth every 8 (eight) hours. 08/08/17   Khatri, Hina, PA-C  ibuprofen (ADVIL,MOTRIN) 800 MG tablet Take 1 tablet (800 mg total) by mouth 3 (three) times daily. 07/19/17   Ward, Chase Picket, PA-C  oseltamivir (TAMIFLU) 75 MG capsule Take 1 capsule (75 mg total) by mouth every 12 (twelve) hours. 07/19/17   Ward, Chase Picket, PA-C  predniSONE (DELTASONE) 20 MG tablet Take 3 tablets (60 mg total) by  mouth daily. 07/19/17   Ward, Chase Picket, PA-C    Family History History reviewed. No pertinent family history.  Social History Social History   Tobacco Use  . Smoking status: Former Smoker    Packs/day: 0.50    Types: Cigarettes    Last attempt to quit: 12/26/2012    Years since quitting: 5.1  . Smokeless tobacco: Never Used  Substance Use Topics  . Alcohol use: No  . Drug use: No     Allergies   Patient has no known allergies.   Review of Systems Review of Systems  Constitutional: Negative for fever.  HENT: Positive for congestion and sneezing. Negative for ear pain, sinus pressure, sore throat and trouble swallowing.   Respiratory: Positive for cough, chest tightness, shortness of breath and wheezing.   Cardiovascular: Negative for chest pain.  Gastrointestinal: Negative for nausea.  Musculoskeletal: Negative for neck stiffness.  Skin: Negative for rash.  Allergic/Immunologic: Positive for environmental allergies.  Neurological: Negative for syncope and weakness.  Hematological: Negative for adenopathy.  All other systems reviewed and are negative.    Physical Exam Updated Vital Signs BP 129/82   Temp 97.7 F (36.5 C) (Oral)   Resp 18   SpO2 96%   Physical Exam  Constitutional: He appears well-developed and well-nourished.  HENT:  Head: Normocephalic and atraumatic.  Right Ear: Tympanic membrane and external ear normal.  Left Ear: Tympanic membrane and external ear normal.  Nose: Mucosal edema and rhinorrhea present. Right sinus exhibits no maxillary sinus  tenderness and no frontal sinus tenderness. Left sinus exhibits no maxillary sinus tenderness and no frontal sinus tenderness.  Mouth/Throat: Uvula is midline, oropharynx is clear and moist and mucous membranes are normal. No tonsillar exudate.  Eyes: Pupils are equal, round, and reactive to light. Right eye exhibits no discharge. Left eye exhibits no discharge. No scleral icterus.  Neck: Trachea  normal. Neck supple. No spinous process tenderness present. No neck rigidity. Normal range of motion present.  No nuchal rigidity or meningismus  Cardiovascular: Normal rate, regular rhythm and intact distal pulses.  No murmur heard. Pulses:      Radial pulses are 2+ on the right side, and 2+ on the left side.       Dorsalis pedis pulses are 2+ on the right side, and 2+ on the left side.       Posterior tibial pulses are 2+ on the right side, and 2+ on the left side.  No lower extremity swelling or edema. Calves symmetric in size bilaterally.  Pulmonary/Chest: Effort normal. He has wheezes (faint end expiratory). He exhibits no tenderness.  Patient sating at 96% on room air. No increased work of breathing. No accessory muscle use. Patient is sitting upright, speaking in full sentences without difficulty   Abdominal: Soft. Bowel sounds are normal. There is no tenderness. There is no rebound and no guarding.  Musculoskeletal: He exhibits no edema.  Lymphadenopathy:    He has no cervical adenopathy.  Neurological: He is alert.  Skin: Skin is warm and dry. No rash noted. He is not diaphoretic.  Psychiatric: He has a normal mood and affect.  Nursing note and vitals reviewed.    ED Treatments / Results  Labs (all labs ordered are listed, but only abnormal results are displayed) Labs Reviewed - No data to display  EKG None  Radiology No results found.  Procedures Procedures (including critical care time)  Medications Ordered in ED Medications  albuterol (PROVENTIL HFA;VENTOLIN HFA) 108 (90 Base) MCG/ACT inhaler 2 puff (has no administration in time range)  ipratropium-albuterol (DUONEB) 0.5-2.5 (3) MG/3ML nebulizer solution 3 mL (has no administration in time range)  dexamethasone (DECADRON) tablet 10 mg (has no administration in time range)  albuterol (PROVENTIL) (2.5 MG/3ML) 0.083% nebulizer solution 5 mg (5 mg Nebulization Given 02/23/18 1222)     Initial Impression /  Assessment and Plan / ED Course  I have reviewed the triage vital signs and the nursing notes.  Pertinent labs & imaging results that were available during my care of the patient were reviewed by me and considered in my medical decision making (see chart for details).     42 y.o. male presenting for asthma exacerbation.  Patient has been out of his home medications for approximately 1 week.  He typically gets asthma exacerbations when he has flares of his allergies.  Patient denies any fever, chest pain or productive cough.  He is without hypoxia in the department.  Do not feel the patient needs a chest x-ray at this current time.  Patient's breathing improved at baseline and lungs are clear to auscultation after breathing treatments.  He was given dose of Decadron in the department.  He was given an albuterol inhaler in the department.  Recommended that he follow-up with his PCP in the next 1-2 days for follow-up. Patient was given medication to assist with allergies. Time was given for all questions to be answered.  Return precautions were discussed.  Patient agreement with plan and appears safe for discharge.  Final Clinical Impressions(s) / ED Diagnoses   Final diagnoses:  Mild intermittent asthma with exacerbation    ED Discharge Orders         Ordered    cetirizine (ZYRTEC) 10 MG tablet  Daily     02/23/18 1322    fluticasone (FLONASE) 50 MCG/ACT nasal spray  Daily     02/23/18 1322           Princella PellegriniMaczis, Rim Thatch M, PA-C 02/23/18 1324    Gerhard MunchLockwood, Robert, MD 02/23/18 1625

## 2018-02-23 NOTE — ED Triage Notes (Signed)
Onset 2 days shortness of breath.   Pt ran out of inhaler.  H/o asthma. Talking in complete sentences.  NAD at triage.

## 2018-02-23 NOTE — Discharge Instructions (Addendum)
You were seen here today for symptoms related to your known Asthma.   Please follow-up with your doctor in regards to your asthma exacerbation in the next 3 days for further evaluation and management of your asthma. Read the instructions below to learn more about factors that can trigger asthma. Use your albuterol inhaler as directed. Your more than welcome to return to the emergency department if you become short of breath, your albuterol inhaler does not relieve symptoms, you are having chest pain associated with difficulty breathing, or any symptoms that are concerning to you.    IDENTIFY AND CONTROL FACTORS THAT MAKE YOUR ASTHMA WORSE A number of common things can set off or make your asthma symptoms worse (asthma triggers). Keep track of your asthma symptoms for several weeks, detailing all the environmental and emotional factors that are linked with your asthma. When you have an asthma attack, go back to your asthma diary to see which factor, or combination of factors, might have contributed to it. Once you know what these factors are, you can take steps to control many of them.  Allergies: If you have allergies and asthma, it is important to take asthma prevention steps at home. Asthma attacks (worsening of asthma symptoms) can be triggered by allergies, which can cause temporary increased inflammation of your airways. Minimizing contact with the substance to which you are allergic will help prevent an asthma attack. Animal Dander:  Some people are allergic to the flakes of skin or dried saliva from animals with fur or feathers. Keep these pets out of your home.  If you can't keep a pet outdoors, keep the pet out of your bedroom and other sleeping areas at all times, and keep the door closed.  Remove carpets and furniture covered with cloth from your home. If that is not possible, keep the pet away from fabric-covered furniture and carpets.  Dust Mites: Many people with asthma are allergic to  dust mites. Dust mites are tiny bugs that are found in every home, in mattresses, pillows, carpets, fabric-covered furniture, bedcovers, clothes, stuffed toys, fabric, and other fabric-covered items.  Cover your mattress in a special dust-proof cover.  Cover your pillow in a special dust-proof cover, or wash the pillow each week in hot water. Water must be hotter than 130 F to kill dust mites. Cold or warm water used with detergent and bleach can also be effective.  Wash the sheets and blankets on your bed each week in hot water.  Try not to sleep or lie on cloth-covered cushions.  Call ahead when traveling and ask for a smoke-free hotel room. Bring your own bedding and pillows, in case the hotel only supplies feather pillows and down comforters, which may contain dust mites and cause asthma symptoms.  Remove carpets from your bedroom and those laid on concrete, if you can.  Keep stuffed toys out of the bed, or wash the toys weekly in hot water or cooler water with detergent and bleach.  Cockroaches: Many people with asthma are allergic to the droppings and remains of cockroaches.  Keep food and garbage in closed containers. Never leave food out.  Use poison baits, traps, powders, gels, or paste (for example, boric acid).  If a spray is used to kill cockroaches, stay out of the room until the odor goes away.  Indoor Mold: Fix leaky faucets, pipes, or other sources of water that have mold around them.  Clean moldy surfaces with a cleaner that has bleach in it.  Pollen and Outdoor Mold: When pollen or mold spore counts are high, try to keep your windows closed.  Stay indoors with windows closed from late morning to afternoon, if you can. Pollen and some mold spore counts are highest at that time.  Ask your caregiver whether you need to take or increase anti-inflammatory medicine before your allergy season starts.  Irritants:  Tobacco smoke is an irritant. If you smoke, ask your caregiver how you  can quit. Ask family members to quit smoking, too. Do not allow smoking in your home or car.  If possible, do not use a wood-burning stove, kerosene heater, or fireplace. Minimize exposure to all sources of smoke, including incense, candles, fires, and fireworks.  Try to stay away from strong odors and sprays, such as perfume, talcum powder, hair spray, and paints.  Decrease humidity in your home and use an indoor air cleaning device. Reduce indoor humidity to below 60 percent. Dehumidifiers or central air conditioners can do this.  Try to have someone else vacuum for you once or twice a week, if you can. Stay out of rooms while they are being vacuumed and for a short while afterward.  If you vacuum, use a dust mask from a hardware store, a double-layered or microfilter vacuum cleaner bag, or a vacuum cleaner with a HEPA filter.  Sulfites in foods and beverages can be irritants. Do not drink beer or wine, or eat dried fruit, processed potatoes, or shrimp if they cause asthma symptoms.  Cold air can trigger an asthma attack. Cover your nose and mouth with a scarf on cold or windy days.  Several health conditions can make asthma more difficult to manage, including runny nose, sinus infections, reflux disease, psychological stress, and sleep apnea. Your caregiver will treat these conditions, as well.  Avoid close contact with people who have a cold or the flu, since your asthma symptoms may get worse if you catch the infection from them. Wash your hands thoroughly after touching items that may have been handled by people with a respiratory infection.  Get a flu shot every year to protect against the flu virus, which often makes asthma worse for days or weeks. Also get a pneumonia shot once every five to 10 years.  Drugs: Aspirin and other painkillers can cause asthma attacks. 10% to 20% of people with asthma have sensitivity to aspirin or a group of painkillers called non-steroidal anti-inflammatory drugs  (NSAIDS), such as ibuprofen and naproxen. These drugs are used to treat pain and reduce fevers. Asthma attacks caused by any of these medicines can be severe and even fatal. These drugs must be avoided in people who have known aspirin sensitive asthma. Products with acetaminophen are considered safe for people who have asthma. It is important that people with aspirin sensitivity read labels of all over-the-counter drugs used to treat pain, colds, coughs, and fever.  Beta blockers and ACE inhibitors are other drugs which you should discuss with your caregiver, in relation to your asthma.   EXERCISE  If you have exercise-induced asthma, or are planning vigorous exercise, or exercise in cold, humid, or dry environments, prevent exercise-induced asthma by following your caregiver's advice regarding asthma treatment before exercising. Additional Information:  Your vital signs today were: BP 129/82    Temp 97.7 F (36.5 C) (Oral)    Resp 18    SpO2 96%  If your blood pressure (BP) was elevated above 135/85 this visit, please have this repeated by your doctor within one month. ---------------

## 2018-04-14 ENCOUNTER — Emergency Department (HOSPITAL_COMMUNITY)
Admission: EM | Admit: 2018-04-14 | Discharge: 2018-04-15 | Disposition: A | Discharge status: Home / Self Care | Payer: Medicaid Other | Attending: Emergency Medicine | Admitting: Emergency Medicine

## 2018-04-14 ENCOUNTER — Encounter (HOSPITAL_COMMUNITY): Payer: Self-pay | Admitting: Emergency Medicine

## 2018-04-14 ENCOUNTER — Other Ambulatory Visit: Payer: Self-pay

## 2018-04-14 DIAGNOSIS — J4521 Mild intermittent asthma with (acute) exacerbation: Secondary | ICD-10-CM | POA: Insufficient documentation

## 2018-04-14 DIAGNOSIS — Z87891 Personal history of nicotine dependence: Secondary | ICD-10-CM | POA: Insufficient documentation

## 2018-04-14 DIAGNOSIS — Z79899 Other long term (current) drug therapy: Secondary | ICD-10-CM | POA: Diagnosis not present

## 2018-04-14 DIAGNOSIS — R0602 Shortness of breath: Secondary | ICD-10-CM | POA: Diagnosis present

## 2018-04-14 MED ORDER — IPRATROPIUM-ALBUTEROL 0.5-2.5 (3) MG/3ML IN SOLN
3.0000 mL | Freq: Once | RESPIRATORY_TRACT | Status: AC
  Administered 2018-04-14: 3 mL via RESPIRATORY_TRACT
  Filled 2018-04-14: qty 3

## 2018-04-14 MED ORDER — ALBUTEROL SULFATE (2.5 MG/3ML) 0.083% IN NEBU: 5.0000 mg | INHALATION_SOLUTION | Freq: Once | RESPIRATORY_TRACT | Status: DC

## 2018-04-14 NOTE — ED Triage Notes (Addendum)
Patient reports asthma attack this morning with wheezing/chest congestion and productive cough , denies fever or chills . Patient evaluated by PA at triage .

## 2018-04-14 NOTE — ED Provider Notes (Signed)
.  Patient placed in Quick Look pathway, seen and evaluated   Chief Complaint: SOB  HPI:   Pt presenting with intermittent SOB, worsening this morning. Clear phlegm with cough, typical for asthma exacerbation. Denies fever. Out of albuterol.   ROS: SOB.  Physical Exam:   Gen: No distress  Neuro: Awake and Alert  Skin: Warm    Focused Exam: Lung sounds diminished bilaterally.  Inspiratory and expiratory wheezes bilaterally.  No increased work of breathing.   Initiation of care has begun. The patient has been counseled on the process, plan, and necessity for staying for the completion/evaluation, and the remainder of the medical screening examination    Robinson, [place] N, PA-C 04/14/18 2038    Vanetta Mulders, MD 04/21/18 [id number]

## 2018-04-15 MED ORDER — DEXAMETHASONE SODIUM PHOSPHATE 10 MG/ML IJ SOLN
10.0000 mg | Freq: Once | INTRAMUSCULAR | Status: AC
  Administered 2018-04-15: 10 mg via INTRAMUSCULAR
  Filled 2018-04-15: qty 1

## 2018-04-15 MED ORDER — ALBUTEROL SULFATE HFA 108 (90 BASE) MCG/ACT IN AERS
2.0000 | INHALATION_SPRAY | Freq: Once | RESPIRATORY_TRACT | Status: AC
Start: 2018-04-15 — End: 2018-04-15
  Administered 2018-04-15: 2 via RESPIRATORY_TRACT
  Filled 2018-04-15: qty 6.7

## 2018-04-15 MED ORDER — ALBUTEROL SULFATE HFA 108 (90 BASE) MCG/ACT IN AERS: 2.0000 | INHALATION_SPRAY | RESPIRATORY_TRACT | 0 refills | Status: DC | PRN

## 2018-04-15 NOTE — ED Notes (Signed)
ED Provider at bedside. 

## 2018-04-15 NOTE — ED Provider Notes (Signed)
[facility] EMERGENCY DEPARTMENT Provider Note   CSN: [id number] Arrival date & time: 04/14/18  1955     History   Chief Complaint Chief Complaint  Patient presents with  . Asthma    HPI Jesus Haynes is a 42 y.o. male.  HPI  This is a 42 year old male with a history of asthma who presents with shortness of breath.  Patient reports 1 to 2-day history of worsening shortness of breath.  He states he woke up this morning and had difficulty catching his breath.  He feels this is related to his asthma.  He does not have an inhaler at home.  He reports dry cough.  No fevers.  He has a history of smoking but states he no longer smokes.  Denies any significant hospitalizations or intubations related to asthma.  He had a DuoNeb in triage which made him feel somewhat better.  He denies chest pain.  Past Medical History:  Diagnosis Date  . Asthma     There are no active problems to display for this patient.   History reviewed. No pertinent surgical history.      Home Medications    Prior to Admission medications   Medication Sig Start Date End Date Taking? Authorizing Provider  albuterol (PROVENTIL HFA;VENTOLIN HFA) 108 (90 Base) MCG/ACT inhaler Inhale 1-2 puffs into the lungs every 6 (six) hours as needed for wheezing or shortness of breath. 08/08/17   Khatri, Hina, PA-C  albuterol (PROVENTIL HFA;VENTOLIN HFA) 108 (90 Base) MCG/ACT inhaler Inhale 2 puffs into the lungs every 4 (four) hours as needed for wheezing or shortness of breath. 04/15/18   Remee Charley, Mayer Masker, MD  benzonatate (TESSALON) 100 MG capsule Take 1 capsule (100 mg total) by mouth every 8 (eight) hours. 08/08/17   Khatri, Hina, PA-C  cetirizine (ZYRTEC) 10 MG tablet Take 1 tablet (10 mg total) by mouth daily. 02/23/18   Maczis, Elmer Sow, PA-C  fluticasone (FLONASE) 50 MCG/ACT nasal spray Place 1-2 sprays into both nostrils daily. 02/23/18   Maczis, Elmer Sow, PA-C  ibuprofen (ADVIL,MOTRIN) 800 MG  tablet Take 1 tablet (800 mg total) by mouth 3 (three) times daily. 07/19/17   Ward, Chase Picket, PA-C  oseltamivir (TAMIFLU) 75 MG capsule Take 1 capsule (75 mg total) by mouth every 12 (twelve) hours. 07/19/17   Ward, Chase Picket, PA-C  predniSONE (DELTASONE) 20 MG tablet Take 3 tablets (60 mg total) by mouth daily. 07/19/17   Ward, Chase Picket, PA-C    Family History No family history on file.  Social History Social History   Tobacco Use  . Smoking status: Former Smoker    Packs/day: 0.50    Types: Cigarettes    Last attempt to quit: 12/26/2012    Years since quitting: 5.3  . Smokeless tobacco: Never Used  Substance Use Topics  . Alcohol use: No  . Drug use: No     Allergies   Patient has no known allergies.   Review of Systems Review of Systems  Constitutional: Negative for fever.  Respiratory: Positive for cough, shortness of breath and wheezing.   Cardiovascular: Negative for chest pain and leg swelling.  Gastrointestinal: Negative for nausea and vomiting.  All other systems reviewed and are negative.    Physical Exam Updated Vital Signs BP 130/86 (BP Location: Right Arm)   Pulse 76   Temp 97.7 F (36.5 C) (Oral)   Resp 18   SpO2 96%   Physical Exam  Constitutional: He is oriented  to person, place, and time. He appears well-developed and well-nourished.  HENT:  Head: Normocephalic and atraumatic.  Eyes: Pupils are equal, round, and reactive to light.  Neck: Neck supple.  Cardiovascular: Normal rate, regular rhythm and normal heart sounds.  No murmur heard. Pulmonary/Chest: Effort normal. No respiratory distress. He has wheezes.  Speaking in full sentences, good air movement with end expiratory wheezing in all lung fields, no respiratory distress  Abdominal: Soft. Bowel sounds are normal. There is no tenderness. There is no rebound.  Musculoskeletal: He exhibits no edema.  Neurological: He is alert and oriented to person, place, and time.  Skin: Skin  is warm and dry.  Psychiatric: He has a normal mood and affect.  Nursing note and vitals reviewed.    ED Treatments / Results  Labs (all labs ordered are listed, but only abnormal results are displayed) Labs Reviewed - No data to display  EKG None  Radiology No results found.  Procedures Procedures (including critical care time)  Medications Ordered in ED Medications  ipratropium-albuterol (DUONEB) 0.5-2.5 (3) MG/3ML nebulizer solution 3 mL (3 mLs Nebulization Given 04/14/18 2041)  dexamethasone (DECADRON) injection 10 mg (10 mg Intramuscular Given 04/15/18 0232)  albuterol (PROVENTIL HFA;VENTOLIN HFA) 108 (90 Base) MCG/ACT inhaler 2 puff (2 puffs Inhalation Given 04/15/18 0232)     Initial Impression / Assessment and Plan / ED Course  I have reviewed the triage vital signs and the nursing notes.  Pertinent labs & imaging results that were available during my care of the patient were reviewed by me and considered in my medical decision making (see chart for details).    Patient presents with shortness of breath and wheezing.  Does not have an inhaler at home.  History of asthma but sounds to be mild.  He is nontoxic-appearing.  No respiratory distress.  Patient was given IM Decadron and an inhaler.  He was able to ambulate and maintain pulse ox greater than 96%.  On recheck, breath sounds improved after 2 puffs of the inhaler.  He is ready for discharge.  No indication for imaging or further work-up at this time.  After history, exam, and medical workup I feel the patient has been appropriately medically screened and is safe for discharge home. Pertinent diagnoses were discussed with the patient. Patient was given return precautions.   Final Clinical Impressions(s) / ED Diagnoses   Final diagnoses:  Mild intermittent asthma with exacerbation    ED Discharge Orders         Ordered    albuterol (PROVENTIL HFA;VENTOLIN HFA) 108 (90 Base) MCG/ACT inhaler  Every 4 hours PRN      04/15/18 0246           Shon Baton, MD 04/15/18 [phone]

## 2018-04-15 NOTE — ED Notes (Signed)
Pt reports coughing for a few days but that today the wheezing and cough is worse. Denies fevers states he does not have an inhaler at home.

## 2018-04-15 NOTE — ED Notes (Signed)
Pt ambulated with pulse ox around the pod with oxygen saturation at 95% or higher

## 2018-05-26 ENCOUNTER — Encounter (HOSPITAL_COMMUNITY): Payer: Self-pay | Admitting: Family Medicine

## 2018-05-26 ENCOUNTER — Emergency Department (HOSPITAL_COMMUNITY)
Admission: EM | Admit: 2018-05-26 | Discharge: 2018-05-26 | Disposition: A | Discharge status: Home / Self Care | Payer: Medicaid Other | Attending: Emergency Medicine | Admitting: Emergency Medicine

## 2018-05-26 DIAGNOSIS — J45909 Unspecified asthma, uncomplicated: Secondary | ICD-10-CM | POA: Insufficient documentation

## 2018-05-26 DIAGNOSIS — Z5321 Procedure and treatment not carried out due to patient leaving prior to being seen by health care provider: Secondary | ICD-10-CM | POA: Diagnosis not present

## 2018-05-26 DIAGNOSIS — R0602 Shortness of breath: Secondary | ICD-10-CM | POA: Insufficient documentation

## 2018-05-26 MED ORDER — ALBUTEROL SULFATE (2.5 MG/3ML) 0.083% IN NEBU
5.0000 mg | INHALATION_SOLUTION | Freq: Once | RESPIRATORY_TRACT | Status: AC
  Administered 2018-05-26: 5 mg via RESPIRATORY_TRACT
  Filled 2018-05-26: qty 6

## 2018-05-26 NOTE — ED Triage Notes (Signed)
Patient reports he is experiencing an asthma attack that started two days ago. He reports he has an inhaler but thinks it is empty. Patient's respirations are even, regular, and mildly labored. Patient is able to talk in full sentences.

## 2018-05-27 ENCOUNTER — Emergency Department (HOSPITAL_COMMUNITY)
Admission: EM | Admit: 2018-05-27 | Discharge: 2018-05-27 | Disposition: A | Discharge status: Home / Self Care | Payer: Medicaid Other | Attending: Emergency Medicine | Admitting: Emergency Medicine

## 2018-05-27 ENCOUNTER — Encounter (HOSPITAL_COMMUNITY): Payer: Self-pay

## 2018-05-27 DIAGNOSIS — Z79899 Other long term (current) drug therapy: Secondary | ICD-10-CM | POA: Diagnosis not present

## 2018-05-27 DIAGNOSIS — Z87891 Personal history of nicotine dependence: Secondary | ICD-10-CM | POA: Insufficient documentation

## 2018-05-27 DIAGNOSIS — J4521 Mild intermittent asthma with (acute) exacerbation: Secondary | ICD-10-CM | POA: Diagnosis not present

## 2018-05-27 DIAGNOSIS — R0602 Shortness of breath: Secondary | ICD-10-CM | POA: Diagnosis present

## 2018-05-27 MED ORDER — ALBUTEROL SULFATE HFA 108 (90 BASE) MCG/ACT IN AERS: 1.0000 | INHALATION_SPRAY | Freq: Four times a day (QID) | RESPIRATORY_TRACT | 3 refills | Status: DC | PRN

## 2018-05-27 MED ORDER — ALBUTEROL SULFATE (5 MG/ML) 0.5% IN NEBU: 2.5000 mg | INHALATION_SOLUTION | Freq: Four times a day (QID) | RESPIRATORY_TRACT | 0 refills | Status: DC | PRN

## 2018-05-27 MED ORDER — ALBUTEROL SULFATE (2.5 MG/3ML) 0.083% IN NEBU
5.0000 mg | INHALATION_SOLUTION | Freq: Once | RESPIRATORY_TRACT | Status: AC
  Administered 2018-05-27: 5 mg via RESPIRATORY_TRACT
  Filled 2018-05-27: qty 6

## 2018-05-27 MED ORDER — ALBUTEROL SULFATE HFA 108 (90 BASE) MCG/ACT IN AERS
1.0000 | INHALATION_SPRAY | Freq: Once | RESPIRATORY_TRACT | Status: AC
  Administered 2018-05-27: 1 via RESPIRATORY_TRACT
  Filled 2018-05-27: qty 6.7

## 2018-05-27 NOTE — ED Provider Notes (Signed)
[facility]-EMERGENCY DEPT Provider Note   CSN: [id number] Arrival date & time: 05/27/18  1114     History   Chief Complaint Chief Complaint  Patient presents with  . Asthma    HPI Jesus Haynes is a 42 y.o. male with history of asthma presents for evaluation of acute onset, waxing and waning shortness of breath for 3 days.  Has a history of asthma and reports that he uses his albuterol inhaler approximately 30 times per week with relief but ran out of it 3 days ago.  He reports chest tightness and wheezing.  No aggravating or alleviating factors noted.  He denies lightheadedness, chest pain, fevers, cough, nasal congestion, abdominal pain, nausea, or vomiting.  He is a former smoker.  Reports that he is primarily here for refill of his albuterol inhaler.  He did receive a breathing treatment prior to my assessment and notes he feels significantly better and is breathing back to his baseline.  The history is provided by the patient.    Past Medical History:  Diagnosis Date  . Asthma     There are no active problems to display for this patient.   History reviewed. No pertinent surgical history.      Home Medications    Prior to Admission medications   Medication Sig Start Date End Date Taking? Authorizing Provider  albuterol (PROVENTIL HFA;VENTOLIN HFA) 108 (90 Base) MCG/ACT inhaler Inhale 1-2 puffs into the lungs every 6 (six) hours as needed for wheezing or shortness of breath. 05/27/18   Dayannara Pascal A, PA-C  albuterol (PROVENTIL) (5 MG/ML) 0.5% nebulizer solution Take 0.5 mLs (2.5 mg total) by nebulization every 6 (six) hours as needed for wheezing or shortness of breath. 05/27/18   Mika Griffitts A, PA-C  benzonatate (TESSALON) 100 MG capsule Take 1 capsule (100 mg total) by mouth every 8 (eight) hours. 08/08/17   Khatri, Hina, PA-C  cetirizine (ZYRTEC) 10 MG tablet Take 1 tablet (10 mg total) by mouth daily. 02/23/18   Maczis, Elmer SowMichael M, PA-C    fluticasone (FLONASE) 50 MCG/ACT nasal spray Place 1-2 sprays into both nostrils daily. 02/23/18   Maczis, Elmer SowMichael M, PA-C  ibuprofen (ADVIL,MOTRIN) 800 MG tablet Take 1 tablet (800 mg total) by mouth 3 (three) times daily. 07/19/17   Ward, Chase PicketJaime Pilcher, PA-C  oseltamivir (TAMIFLU) 75 MG capsule Take 1 capsule (75 mg total) by mouth every 12 (twelve) hours. 07/19/17   Ward, Chase PicketJaime Pilcher, PA-C  predniSONE (DELTASONE) 20 MG tablet Take 3 tablets (60 mg total) by mouth daily. 07/19/17   Ward, Chase PicketJaime Pilcher, PA-C    Family History History reviewed. No pertinent family history.  Social History Social History   Tobacco Use  . Smoking status: Former Smoker    Packs/day: 0.50    Types: Cigarettes    Last attempt to quit: 12/26/2012    Years since quitting: 5.4  . Smokeless tobacco: Never Used  Substance Use Topics  . Alcohol use: No  . Drug use: No     Allergies   Patient has no known allergies.   Review of Systems Review of Systems  Constitutional: Negative for chills and fever.  HENT: Negative for congestion and sore throat.   Respiratory: Positive for chest tightness, shortness of breath and wheezing. Negative for cough.   Cardiovascular: Negative for chest pain.  Gastrointestinal: Negative for abdominal pain, nausea and vomiting.  Neurological: Negative for syncope and light-headedness.  All other systems reviewed and are negative.  Physical Exam Updated Vital Signs BP 124/81 (BP Location: Right Arm)   Pulse 84   Temp 97.9 F (36.6 C) (Oral)   Resp (!) 22   Ht 5\' 6"  (1.676 m)   Wt 79.4 kg   SpO2 97%   BMI 28.25 kg/m   Physical Exam  Constitutional: He appears well-developed and well-nourished. No distress.  Resting comfortably in chair.  HENT:  Head: Normocephalic and atraumatic.  Eyes: Conjunctivae are normal. Right eye exhibits no discharge. Left eye exhibits no discharge.  Neck: No JVD present. No tracheal deviation present.  Cardiovascular: Normal rate,  regular rhythm and normal heart sounds.  Pulmonary/Chest: Effort normal. No respiratory distress. He has no wheezes. He exhibits no tenderness.  Speaking in full sentences without difficulty.  Scattered rhonchi.  No increased work of breathing or retractions.  SPO2 saturations 98 200% on room air  Abdominal: He exhibits no distension.  Musculoskeletal: He exhibits no edema.  Neurological: He is alert.  Skin: Skin is warm and dry. No erythema.  Psychiatric: He has a normal mood and affect. His behavior is normal.  Nursing note and vitals reviewed.    ED Treatments / Results  Labs (all labs ordered are listed, but only abnormal results are displayed) Labs Reviewed - No data to display  EKG None  Radiology No results found.  Procedures Procedures (including critical care time)  Medications Ordered in ED Medications  albuterol (PROVENTIL HFA;VENTOLIN HFA) 108 (90 Base) MCG/ACT inhaler 1 puff (has no administration in time range)  albuterol (PROVENTIL) (2.5 MG/3ML) 0.083% nebulizer solution 5 mg (5 mg Nebulization Given 05/27/18 1134)     Initial Impression / Assessment and Plan / ED Course  I have reviewed the triage vital signs and the nursing notes.  Pertinent labs & imaging results that were available during my care of the patient were reviewed by me and considered in my medical decision making (see chart for details).     Patient presenting for asthma exacerbation.  Has been out of his albuterol for 3 days.  Takes his albuterol inhaler a fair amount during the week with improvement and does not take any other asthma medications like a LABA inhaler.  Received a breathing treatment prior to my assessment with significant improvement, reports he is breathing at his baseline.  Patient ambulatory in ED with O2 saturations maintained >90, no current signs of respiratory distress. Lung exam improved after nebulizer treatment.  Low suspicion of pneumonia or PE.  No chest pain to  suggest ACS/MI.  No upper respiratory symptoms.  Pt has been instructed to continue using prescribed medications and to speak with PCP about today's exacerbation.  I have referred him to [facility] for follow-up.  Discussed strict ED return precautions. Pt verbalized understanding of and agreement with plan and is safe for discharge home at this time.    Final Clinical Impressions(s) / ED Diagnoses   Final diagnoses:  Mild intermittent asthma with exacerbation    ED Discharge Orders         Ordered    albuterol (PROVENTIL) (5 MG/ML) 0.5% nebulizer solution  Every 6 hours PRN     05/27/18 1328    DME Nebulizer machine     05/27/18 1328    albuterol (PROVENTIL HFA;VENTOLIN HFA) 108 (90 Base) MCG/ACT inhaler  Every 6 hours PRN     05/27/18 1328           Jaben Benegas, [place] A, PA-C 05/27/18 1331    [place],  Raynelle Fanning, MD 05/27/18 1427

## 2018-05-27 NOTE — Discharge Instructions (Signed)
Use your albuterol inhaler every 4-6 hours as needed for shortness of breath.  You may find it helpful to take over-the-counter allergy medicine such as Zyrtec or Allegra.  Call [facility] today or tomorrow to set up a follow-up appointment.  Tell them you were referred from the emergency department.  They will likely take them some time to see you, however they are an affordable option to establish primary care services.  Return to the emergency department if any concerning signs or symptoms develop such as worsening shortness of breath despite use of albuterol, high fevers, chest pains, or passing out.

## 2018-05-27 NOTE — ED Notes (Signed)
Bed: WTR7 Expected date:  Expected time:  Means of arrival:  Comments: 

## 2018-05-27 NOTE — ED Triage Notes (Signed)
Pt presents with c/o asthma. Pt was here yesterday for same but left before seeing a doctor.

## 2018-08-24 ENCOUNTER — Other Ambulatory Visit: Payer: Self-pay

## 2018-08-24 ENCOUNTER — Emergency Department (HOSPITAL_COMMUNITY): Payer: Self-pay

## 2018-08-24 ENCOUNTER — Emergency Department (HOSPITAL_COMMUNITY)
Admission: EM | Admit: 2018-08-24 | Discharge: 2018-08-24 | Disposition: A | Discharge status: Home / Self Care | Payer: Self-pay | Attending: Emergency Medicine | Admitting: Emergency Medicine

## 2018-08-24 ENCOUNTER — Encounter (HOSPITAL_COMMUNITY): Payer: Self-pay | Admitting: *Deleted

## 2018-08-24 DIAGNOSIS — Z87891 Personal history of nicotine dependence: Secondary | ICD-10-CM | POA: Insufficient documentation

## 2018-08-24 DIAGNOSIS — J4521 Mild intermittent asthma with (acute) exacerbation: Secondary | ICD-10-CM

## 2018-08-24 DIAGNOSIS — Z79899 Other long term (current) drug therapy: Secondary | ICD-10-CM | POA: Insufficient documentation

## 2018-08-24 DIAGNOSIS — J45901 Unspecified asthma with (acute) exacerbation: Secondary | ICD-10-CM | POA: Insufficient documentation

## 2018-08-24 LAB — BASIC METABOLIC PANEL
Anion gap: 11 (ref 5–15)
BUN: 12 mg/dL (ref 6–20)
CO2: 19 mmol/L — ABNORMAL LOW (ref 22–32)
Calcium: 9.1 mg/dL (ref 8.9–10.3)
Chloride: 109 mmol/L (ref 98–111)
Creatinine, Ser: 0.77 mg/dL (ref 0.61–1.24)
GFR calc Af Amer: >60 mL/min (ref >60)
GFR calc non Af Amer: >60 mL/min (ref >60)
Glucose, Bld: 99 mg/dL (ref 70–99)
Potassium: 4 mmol/L (ref 3.5–5.1)
Sodium: 139 mmol/L (ref 135–145)

## 2018-08-24 LAB — CBC WITH DIFFERENTIAL/PLATELET
Abs Immature Granulocytes: 0.03 10*3/uL (ref 0.00–0.07)
Basophils Absolute: 0.1 10*3/uL (ref 0.0–0.1)
Basophils Relative: 1 %
EOS PCT: 3 %
Eosinophils Absolute: 0.3 10*3/uL (ref 0.0–0.5)
HCT: 46.4 % (ref 39.0–52.0)
Hemoglobin: 15.1 g/dL (ref 13.0–17.0)
Immature Granulocytes: 0 %
Lymphocytes Relative: 36 %
Lymphs Abs: 3.5 10*3/uL (ref 0.7–4.0)
MCH: 29 pg (ref 26.0–34.0)
MCHC: 32.5 g/dL (ref 30.0–36.0)
MCV: 89.2 fL (ref 80.0–100.0)
MONO ABS: 0.9 10*3/uL (ref 0.1–1.0)
Monocytes Relative: 9 %
Neutro Abs: 5.1 10*3/uL (ref 1.7–7.7)
Neutrophils Relative %: 51 %
Platelets: 210 10*3/uL (ref 150–400)
RBC: 5.2 MIL/uL (ref 4.22–5.81)
RDW: 13.2 % (ref 11.5–15.5)
WBC: 9.9 10*3/uL (ref 4.0–10.5)
nRBC: 0 % (ref 0.0–0.2)

## 2018-08-24 MED ORDER — ALBUTEROL SULFATE HFA 108 (90 BASE) MCG/ACT IN AERS: 1.0000 | INHALATION_SPRAY | Freq: Four times a day (QID) | RESPIRATORY_TRACT | 1 refills | Status: DC | PRN

## 2018-08-24 MED ORDER — MAGNESIUM SULFATE 2 GM/50ML IV SOLN
2.0000 g | Freq: Once | INTRAVENOUS | Status: AC
  Administered 2018-08-24: 2 g via INTRAVENOUS
  Filled 2018-08-24: qty 50

## 2018-08-24 MED ORDER — PREDNISONE 10 MG PO TABS: 20.0000 mg | ORAL_TABLET | Freq: Every day | ORAL | 0 refills | Status: DC

## 2018-08-24 MED ORDER — IPRATROPIUM-ALBUTEROL 0.5-2.5 (3) MG/3ML IN SOLN
3.0000 mL | Freq: Once | RESPIRATORY_TRACT | Status: AC
  Administered 2018-08-24: 3 mL via RESPIRATORY_TRACT
  Filled 2018-08-24: qty 3

## 2018-08-24 MED ORDER — METHYLPREDNISOLONE SODIUM SUCC 125 MG IJ SOLR
125.0000 mg | Freq: Once | INTRAMUSCULAR | Status: AC
  Administered 2018-08-24: 125 mg via INTRAVENOUS
  Filled 2018-08-24: qty 2

## 2018-08-24 MED ORDER — ALBUTEROL SULFATE (2.5 MG/3ML) 0.083% IN NEBU
2.5000 mg | INHALATION_SOLUTION | Freq: Once | RESPIRATORY_TRACT | Status: AC
  Administered 2018-08-24: 2.5 mg via RESPIRATORY_TRACT
  Filled 2018-08-24: qty 3

## 2018-08-24 MED ORDER — ALBUTEROL SULFATE HFA 108 (90 BASE) MCG/ACT IN AERS
2.0000 | INHALATION_SPRAY | RESPIRATORY_TRACT | Status: DC | PRN
  Filled 2018-08-24: qty 6.7

## 2018-08-24 NOTE — ED Provider Notes (Signed)
[facility] EMERGENCY DEPARTMENT Provider Note   CSN: [id number] Arrival date & time: 08/24/18  6644    History   Chief Complaint Chief Complaint  Patient presents with  . Shortness of Breath    HPI Jesus Haynes is a 43 y.o. male.     Patient complains of shortness of breath.  Patient has a history of asthma.  He is out of his medicines  The history is provided by the patient. No language interpreter was used.  Shortness of Breath  Severity:  Moderate Onset quality:  Gradual Timing:  Constant Progression:  Worsening Chronicity:  Recurrent Context: activity   Relieved by:  Nothing Worsened by:  Nothing Ineffective treatments:  None tried Associated symptoms: wheezing   Associated symptoms: no abdominal pain, no chest pain, no cough, no headaches and no rash   Risk factors: no recent alcohol use     Past Medical History:  Diagnosis Date  . Asthma     There are no active problems to display for this patient.   History reviewed. No pertinent surgical history.      Home Medications    Prior to Admission medications   Medication Sig Start Date End Date Taking? Authorizing Provider  albuterol (PROVENTIL HFA;VENTOLIN HFA) 108 (90 Base) MCG/ACT inhaler Inhale 1-2 puffs into the lungs every 6 (six) hours as needed for wheezing or shortness of breath. 08/24/18   Bethann Berkshire, MD  benzonatate (TESSALON) 100 MG capsule Take 1 capsule (100 mg total) by mouth every 8 (eight) hours. 08/08/17   Khatri, Hina, PA-C  cetirizine (ZYRTEC) 10 MG tablet Take 1 tablet (10 mg total) by mouth daily. 02/23/18   Maczis, Elmer Sow, PA-C  fluticasone (FLONASE) 50 MCG/ACT nasal spray Place 1-2 sprays into both nostrils daily. 02/23/18   Maczis, Elmer Sow, PA-C  ibuprofen (ADVIL,MOTRIN) 800 MG tablet Take 1 tablet (800 mg total) by mouth 3 (three) times daily. 07/19/17   Ward, Chase Picket, PA-C  oseltamivir (TAMIFLU) 75 MG capsule Take 1 capsule (75 mg total) by mouth  every 12 (twelve) hours. 07/19/17   Ward, Chase Picket, PA-C  predniSONE (DELTASONE) 10 MG tablet Take 2 tablets (20 mg total) by mouth daily. 08/24/18   Bethann Berkshire, MD    Family History History reviewed. No pertinent family history.  Social History Social History   Tobacco Use  . Smoking status: Former Smoker    Packs/day: 0.50    Types: Cigarettes    Last attempt to quit: 12/26/2012    Years since quitting: 5.6  . Smokeless tobacco: Never Used  Substance Use Topics  . Alcohol use: No  . Drug use: No     Allergies   Patient has no known allergies.   Review of Systems Review of Systems  Constitutional: Negative for appetite change and fatigue.  HENT: Negative for congestion, ear discharge and sinus pressure.   Eyes: Negative for discharge.  Respiratory: Positive for shortness of breath and wheezing. Negative for cough.   Cardiovascular: Negative for chest pain.  Gastrointestinal: Negative for abdominal pain and diarrhea.  Genitourinary: Negative for frequency and hematuria.  Musculoskeletal: Negative for back pain.  Skin: Negative for rash.  Neurological: Negative for seizures and headaches.  Psychiatric/Behavioral: Negative for hallucinations.     Physical Exam Updated Vital Signs BP 91/81   Pulse 75   Ht 5\' 7"  (1.702 m)   Wt 79.4 kg   SpO2 95%   BMI 27.41 kg/m   Physical Exam Vitals signs  and nursing note reviewed.  Constitutional:      Appearance: He is well-developed.  HENT:     Head: Normocephalic.     Nose: Nose normal.  Eyes:     General: No scleral icterus.    Conjunctiva/sclera: Conjunctivae normal.  Neck:     Musculoskeletal: Neck supple.     Thyroid: No thyromegaly.  Cardiovascular:     Rate and Rhythm: Normal rate and regular rhythm.     Heart sounds: No murmur. No friction rub. No gallop.   Pulmonary:     Breath sounds: No stridor. Wheezing present. No rales.  Chest:     Chest wall: No tenderness.  Abdominal:     General: There  is no distension.     Tenderness: There is no abdominal tenderness. There is no rebound.  Musculoskeletal: Normal range of motion.  Lymphadenopathy:     Cervical: No cervical adenopathy.  Skin:    Findings: No erythema or rash.  Neurological:     Mental Status: He is oriented to person, place, and time.     Motor: No abnormal muscle tone.     Coordination: Coordination normal.  Psychiatric:        Behavior: Behavior normal.      ED Treatments / Results  Labs (all labs ordered are listed, but only abnormal results are displayed) Labs Reviewed  BASIC METABOLIC PANEL - Abnormal; Notable for the following components:      Result Value   CO2 19 (*)    All other components within normal limits  CBC WITH DIFFERENTIAL/PLATELET    EKG None  Radiology Dg Chest Port 1 View  Result Date: 08/24/2018 CLINICAL DATA:  Shortness of breath. EXAM: PORTABLE CHEST 1 VIEW COMPARISON:  Chest CT 08/08/2017 FINDINGS: Cardiomediastinal silhouette is normal. Mediastinal contours appear intact. There is no evidence of focal airspace consolidation, pleural effusion or pneumothorax. Osseous structures are without acute abnormality. Soft tissues are grossly normal. IMPRESSION: No active disease. Electronically Signed   By: Ted Mcalpineobrinka  Dimitrova M.D.   On: 08/24/2018 08:04    Procedures Procedures (including critical care time)  Medications Ordered in ED Medications  albuterol (PROVENTIL HFA;VENTOLIN HFA) 108 (90 Base) MCG/ACT inhaler 2 puff (has no administration in time range)  ipratropium-albuterol (DUONEB) 0.5-2.5 (3) MG/3ML nebulizer solution 3 mL (3 mLs Nebulization Given 08/24/18 0732)  albuterol (PROVENTIL) (2.5 MG/3ML) 0.083% nebulizer solution 2.5 mg (2.5 mg Nebulization Given 08/24/18 0732)  methylPREDNISolone sodium succinate (SOLU-MEDROL) 125 mg/2 mL injection 125 mg (125 mg Intravenous Given 08/24/18 0745)  magnesium sulfate IVPB 2 g 50 mL (2 g Intravenous New Bag/Given 08/24/18 0747)    ipratropium-albuterol (DUONEB) 0.5-2.5 (3) MG/3ML nebulizer solution 3 mL (3 mLs Nebulization Given 08/24/18 0916)  albuterol (PROVENTIL) (2.5 MG/3ML) 0.083% nebulizer solution 2.5 mg (2.5 mg Nebulization Given 08/24/18 0916)     Initial Impression / Assessment and Plan / ED Course  I have reviewed the triage vital signs and the nursing notes.  Pertinent labs & imaging results that were available during my care of the patient were reviewed by me and considered in my medical decision making (see chart for details).       Patient with asthma.  Patient improved with treatment in emergency department.  He is sent home with Proventil inhaler and prednisone Final Clinical Impressions(s) / ED Diagnoses   Final diagnoses:  Exacerbation of intermittent asthma, unspecified asthma severity    ED Discharge Orders         Ordered  albuterol (PROVENTIL HFA;VENTOLIN HFA) 108 (90 Base) MCG/ACT inhaler  Every 6 hours PRN     08/24/18 0951    predniSONE (DELTASONE) 10 MG tablet  Daily     08/24/18 0951           Bethann Berkshire, MD 08/24/18 [phone]

## 2018-08-24 NOTE — ED Triage Notes (Signed)
Pr presents with labored breathing. Pt hx of asthmna.

## 2018-08-24 NOTE — Discharge Instructions (Addendum)
Follow-up in 1 to 2 weeks for recheck return if problems

## 2018-11-17 ENCOUNTER — Encounter (HOSPITAL_COMMUNITY): Payer: Self-pay | Admitting: *Deleted

## 2018-11-17 ENCOUNTER — Other Ambulatory Visit: Payer: Self-pay

## 2018-11-17 ENCOUNTER — Emergency Department (HOSPITAL_COMMUNITY)
Admission: EM | Admit: 2018-11-17 | Discharge: 2018-11-17 | Disposition: A | Discharge status: Home / Self Care | Payer: Self-pay | Attending: Emergency Medicine | Admitting: Emergency Medicine

## 2018-11-17 DIAGNOSIS — Z79899 Other long term (current) drug therapy: Secondary | ICD-10-CM | POA: Insufficient documentation

## 2018-11-17 DIAGNOSIS — Z87891 Personal history of nicotine dependence: Secondary | ICD-10-CM | POA: Insufficient documentation

## 2018-11-17 DIAGNOSIS — J45901 Unspecified asthma with (acute) exacerbation: Secondary | ICD-10-CM | POA: Insufficient documentation

## 2018-11-17 MED ORDER — ALBUTEROL SULFATE HFA 108 (90 BASE) MCG/ACT IN AERS
5.0000 | INHALATION_SPRAY | Freq: Once | RESPIRATORY_TRACT | Status: AC
  Administered 2018-11-17: 5 via RESPIRATORY_TRACT
  Filled 2018-11-17: qty 6.7

## 2018-11-17 MED ORDER — PREDNISONE 50 MG PO TABS: 50.0000 mg | ORAL_TABLET | Freq: Every day | ORAL | 0 refills | Status: DC

## 2018-11-17 MED ORDER — ALBUTEROL SULFATE HFA 108 (90 BASE) MCG/ACT IN AERS: 1.0000 | INHALATION_SPRAY | Freq: Four times a day (QID) | RESPIRATORY_TRACT | 3 refills | Status: DC | PRN

## 2018-11-17 MED ORDER — PREDNISONE 20 MG PO TABS
50.0000 mg | ORAL_TABLET | Freq: Once | ORAL | Status: AC
  Administered 2018-11-17: 50 mg via ORAL
  Filled 2018-11-17: qty 3

## 2018-11-17 NOTE — ED Notes (Signed)
Patient verbalizes understanding of discharge instructions. Opportunity for questioning and answers were provided. Armband removed by staff, pt discharged from ED ambulatory to home.  

## 2018-11-17 NOTE — Discharge Instructions (Addendum)
Please read attached information. If you experience any new or worsening signs or symptoms please return to the emergency room for evaluation. Please follow-up with your primary care provider or specialist as discussed. Please use medication prescribed only as directed and discontinue taking if you have any concerning signs or symptoms.   °

## 2018-11-17 NOTE — ED Provider Notes (Signed)
[facility] EMERGENCY DEPARTMENT Provider Note   CSN: [id number] Arrival date & time: 11/17/18  4098    History   Chief Complaint Chief Complaint  Patient presents with  . Asthma    HPI Jesus Haynes is a 43 y.o. male.     HPI   43 year old male with a significant past medical history of asthma presents today with complaints of asthma exacerbation.  He notes last night he started to feel wheezing and tightness in his chest.  This worsened throughout the morning.  He notes that he no longer has a albuterol inhaler at home.  He notes a minimal cough but notes this is typical of asthma exacerbations.  He notes this is identical to previous.  He denies any exacerbating event, he notes that he does not smoke.  He denies any swelling in his lower legs or chest pain.  He denies any fever.  No close sick contacts.  Past Medical History:  Diagnosis Date  . Asthma     There are no active problems to display for this patient.   History reviewed. No pertinent surgical history.      Home Medications    Prior to Admission medications   Medication Sig Start Date End Date Taking? Authorizing Provider  albuterol (VENTOLIN HFA) 108 (90 Base) MCG/ACT inhaler Inhale 1-2 puffs into the lungs every 6 (six) hours as needed for wheezing or shortness of breath. 11/17/18   Theresea Trautmann, Tinnie Gens, PA-C  benzonatate (TESSALON) 100 MG capsule Take 1 capsule (100 mg total) by mouth every 8 (eight) hours. Patient not taking: Reported on 11/17/2018 08/08/17   Dietrich Pates, PA-C  cetirizine (ZYRTEC) 10 MG tablet Take 1 tablet (10 mg total) by mouth daily. 02/23/18   Maczis, Elmer Sow, PA-C  fluticasone (FLONASE) 50 MCG/ACT nasal spray Place 1-2 sprays into both nostrils daily. 02/23/18   Maczis, Elmer Sow, PA-C  ibuprofen (ADVIL,MOTRIN) 800 MG tablet Take 1 tablet (800 mg total) by mouth 3 (three) times daily. Patient not taking: Reported on 11/17/2018 07/19/17   Ward, Chase Picket, PA-C   oseltamivir (TAMIFLU) 75 MG capsule Take 1 capsule (75 mg total) by mouth every 12 (twelve) hours. Patient not taking: Reported on 11/17/2018 07/19/17   Ward, Chase Picket, PA-C  predniSONE (DELTASONE) 50 MG tablet Take 1 tablet (50 mg total) by mouth daily. 11/17/18   Eyvonne Mechanic, PA-C    Family History History reviewed. No pertinent family history.  Social History Social History   Tobacco Use  . Smoking status: Former Smoker    Packs/day: 0.50    Types: Cigarettes    Last attempt to quit: 12/26/2012    Years since quitting: 5.8  . Smokeless tobacco: Never Used  Substance Use Topics  . Alcohol use: No  . Drug use: No     Allergies   Patient has no known allergies.   Review of Systems Review of Systems  All other systems reviewed and are negative.   Physical Exam Updated Vital Signs BP 135/84 (BP Location: Left Arm)   Pulse 93   Temp 97.8 F (36.6 C) (Oral)   Resp (!) 22   SpO2 93%   Physical Exam Vitals signs and nursing note reviewed.  Constitutional:      Appearance: He is well-developed.  HENT:     Head: Normocephalic and atraumatic.  Eyes:     General: No scleral icterus.       Right eye: No discharge.  Left eye: No discharge.     Conjunctiva/sclera: Conjunctivae normal.     Pupils: Pupils are equal, round, and reactive to light.  Neck:     Musculoskeletal: Normal range of motion.     Vascular: No JVD.     Trachea: No tracheal deviation.  Pulmonary:     Effort: Pulmonary effort is normal.     Breath sounds: No stridor. Wheezing present. No rhonchi.     Comments: Prolonged bilateral expiratory wheeze, no inspiratory wheeze, no crackles-no accessory muscle usage Neurological:     Mental Status: He is alert and oriented to person, place, and time.     Coordination: Coordination normal.  Psychiatric:        Behavior: Behavior normal.        Thought Content: Thought content normal.        Judgment: Judgment normal.      ED Treatments /  Results  Labs (all labs ordered are listed, but only abnormal results are displayed) Labs Reviewed - No data to display  EKG None  Radiology No results found.  Procedures Procedures (including critical care time)  Medications Ordered in ED Medications  predniSONE (DELTASONE) tablet 50 mg (has no administration in time range)  albuterol (VENTOLIN HFA) 108 (90 Base) MCG/ACT inhaler 5 puff (5 puffs Inhalation Given 11/17/18 1006)     Initial Impression / Assessment and Plan / ED Course  I have reviewed the triage vital signs and the nursing notes.  Pertinent labs & imaging results that were available during my care of the patient were reviewed by me and considered in my medical decision making (see chart for details).        Labs:   Imaging:  Consults:  Therapeutics: Prednisone, albuterol  Discharge Meds: Prednisone, albuterol  Assessment/Plan: 43 year old male presents today with asthma exacerbation.  He has no signs of infectious etiology.  Typical of previous.  Improved with albuterol, still very minor wheeze.  Will give prednisone, close follow-up for any new or worsening signs or symptoms.  He verbalized understanding and agreement to today's plan.      Final Clinical Impressions(s) / ED Diagnoses   Final diagnoses:  Moderate asthma with exacerbation, unspecified whether persistent    ED Discharge Orders         Ordered    albuterol (VENTOLIN HFA) 108 (90 Base) MCG/ACT inhaler  Every 6 hours PRN     11/17/18 1126    predniSONE (DELTASONE) 50 MG tablet  Daily     11/17/18 1126           Eyvonne MechanicHedges, Mardel Grudzien, PA-C 11/17/18 1128    Raeford RazorKohut, Stephen, MD 11/17/18 1201

## 2018-11-17 NOTE — ED Triage Notes (Signed)
Pt in c/o asthma flare for the last two days, reports he does not have an inhaler at home to use, reports cough that is typical with his exacerbations. Pt speaking in full sentences, denies fever.

## 2018-12-23 ENCOUNTER — Encounter (HOSPITAL_COMMUNITY): Payer: Self-pay

## 2018-12-23 ENCOUNTER — Other Ambulatory Visit: Payer: Self-pay

## 2018-12-23 ENCOUNTER — Emergency Department (HOSPITAL_COMMUNITY)
Admission: EM | Admit: 2018-12-23 | Discharge: 2018-12-23 | Disposition: A | Discharge status: Home / Self Care | Payer: Self-pay | Attending: Emergency Medicine | Admitting: Emergency Medicine

## 2018-12-23 DIAGNOSIS — Z79899 Other long term (current) drug therapy: Secondary | ICD-10-CM | POA: Insufficient documentation

## 2018-12-23 DIAGNOSIS — J45901 Unspecified asthma with (acute) exacerbation: Secondary | ICD-10-CM | POA: Insufficient documentation

## 2018-12-23 DIAGNOSIS — R062 Wheezing: Secondary | ICD-10-CM | POA: Insufficient documentation

## 2018-12-23 MED ORDER — PREDNISONE 20 MG PO TABS
60.0000 mg | ORAL_TABLET | Freq: Once | ORAL | Status: AC
  Administered 2018-12-23: 22:00:00 60 mg via ORAL
  Filled 2018-12-23: qty 3

## 2018-12-23 MED ORDER — ALBUTEROL SULFATE HFA 108 (90 BASE) MCG/ACT IN AERS
8.0000 | INHALATION_SPRAY | Freq: Once | RESPIRATORY_TRACT | Status: AC
  Administered 2018-12-23: 8 via RESPIRATORY_TRACT
  Filled 2018-12-23: qty 6.7

## 2018-12-23 MED ORDER — PREDNISONE 20 MG PO TABS: 40.0000 mg | ORAL_TABLET | Freq: Every day | ORAL | 0 refills | Status: DC

## 2018-12-23 NOTE — ED Triage Notes (Signed)
Pt reports hx of shortness of breath worse this week, pt states hx of asthma. States he has gone through his entire inhaler in 1 week, states no relief w/ his inhaler now. Pt is coughing in triage, not in severe respiratory distress. Audibly wheezing. Denies recent known trigger. No recent steroid scripts. Pt is able to speak in full sentences.

## 2018-12-23 NOTE — Discharge Instructions (Signed)
Continue albuterol as needed for shortness of breath and wheezing Take Prednisone for the next 5 days Follow up with [facility] Return if worsening

## 2018-12-23 NOTE — ED Provider Notes (Signed)
[facility] EMERGENCY DEPARTMENT Provider Note   CSN: [id number] Arrival date & time: 12/23/18  2113     History   Chief Complaint Chief Complaint  Patient presents with  . Shortness of Breath    HPI Jesus Haynes is a 43 y.o. male who presents with shortness of breath and wheezing.  Past medical history significant for asthma.  He states that for the past week he has had shortness of breath, wheezing, coughing.  He states that this is typical for his asthma exacerbations which is usually triggered by allergies.  He was here several weeks ago and received an inhaler and prednisone and felt better for a couple days after this however he started to have wheezing and shortness of breath again soon after.  He went and picked up an inhaler from [place] and states that he thinks it this does not work as well as the inhaler he gets from here.  He denies fever, chest pain, productive cough.  He states that this feels like a typical asthma exacerbation.  He does not currently have a primary care provider.  He does not take any allergy medicine and states that he has tried this in the past and it will work for a couple days but then he feels like he gets "immune to it". He does not smoke.     HPI  Past Medical History:  Diagnosis Date  . Asthma     There are no active problems to display for this patient.   History reviewed. No pertinent surgical history.      Home Medications    Prior to Admission medications   Medication Sig Start Date End Date Taking? Authorizing Provider  albuterol (VENTOLIN HFA) 108 (90 Base) MCG/ACT inhaler Inhale 1-2 puffs into the lungs every 6 (six) hours as needed for wheezing or shortness of breath. 11/17/18   Hedges, Dellis Filbert, PA-C  benzonatate (TESSALON) 100 MG capsule Take 1 capsule (100 mg total) by mouth every 8 (eight) hours. Patient not taking: Reported on 11/17/2018 08/08/17   Delia Heady, PA-C  cetirizine (ZYRTEC) 10 MG tablet Take 1  tablet (10 mg total) by mouth daily. 02/23/18   Maczis, Barth Kirks, PA-C  fluticasone (FLONASE) 50 MCG/ACT nasal spray Place 1-2 sprays into both nostrils daily. 02/23/18   Maczis, Barth Kirks, PA-C  ibuprofen (ADVIL,MOTRIN) 800 MG tablet Take 1 tablet (800 mg total) by mouth 3 (three) times daily. Patient not taking: Reported on 11/17/2018 07/19/17   Ward, Ozella Almond, PA-C  oseltamivir (TAMIFLU) 75 MG capsule Take 1 capsule (75 mg total) by mouth every 12 (twelve) hours. Patient not taking: Reported on 11/17/2018 07/19/17   Ward, Ozella Almond, PA-C  predniSONE (DELTASONE) 50 MG tablet Take 1 tablet (50 mg total) by mouth daily. 11/17/18   Okey Regal, PA-C    Family History History reviewed. No pertinent family history.  Social History Social History   Tobacco Use  . Smoking status: Former Smoker    Packs/day: 0.50    Types: Cigarettes    Quit date: 12/26/2012    Years since quitting: 5.9  . Smokeless tobacco: Never Used  Substance Use Topics  . Alcohol use: No  . Drug use: No     Allergies   Patient has no known allergies.   Review of Systems Review of Systems  Constitutional: Negative for fever.  Respiratory: Positive for cough, shortness of breath and wheezing.   Cardiovascular: Negative for chest pain.  All other systems reviewed  and are negative.    Physical Exam Updated Vital Signs BP (!) 143/92 (BP Location: Right Arm)   Pulse 90   Temp 97.8 F (36.6 C) (Oral)   Resp 18   Ht 5\' 7"  (1.702 m)   Wt 81.6 kg   SpO2 97%   BMI 28.19 kg/m   Physical Exam Vitals signs and nursing note reviewed.  Constitutional:      General: He is not in acute distress.    Appearance: He is well-developed. He is not ill-appearing.     Comments: Calm and cooperative  HENT:     Head: Normocephalic and atraumatic.  Eyes:     General: No scleral icterus.       Right eye: No discharge.        Left eye: No discharge.     Conjunctiva/sclera: Conjunctivae normal.     Pupils:  Pupils are equal, round, and reactive to light.  Neck:     Musculoskeletal: Normal range of motion.  Cardiovascular:     Rate and Rhythm: Normal rate and regular rhythm.  Pulmonary:     Effort: Pulmonary effort is normal. No respiratory distress.     Breath sounds: Wheezing (diffuse moderate expiratory wheezing ) present.  Abdominal:     General: There is no distension.  Skin:    General: Skin is warm and dry.  Neurological:     Mental Status: He is alert and oriented to person, place, and time.  Psychiatric:        Behavior: Behavior normal.      ED Treatments / Results  Labs (all labs ordered are listed, but only abnormal results are displayed) Labs Reviewed - No data to display  EKG    Radiology No results found.  Procedures Procedures (including critical care time)  Medications Ordered in ED Medications  albuterol (VENTOLIN HFA) 108 (90 Base) MCG/ACT inhaler 8 puff (8 puffs Inhalation Given 12/23/18 2214)  predniSONE (DELTASONE) tablet 60 mg (60 mg Oral Given 12/23/18 2213)     Initial Impression / Assessment and Plan / ED Course  I have reviewed the triage vital signs and the nursing notes.  Pertinent labs & imaging results that were available during my care of the patient were reviewed by me and considered in my medical decision making (see chart for details).  43 year old male presents with shortness of breath, wheezing, cough.  He feels like it is like his prior asthma exacerbations.  He is mildly hypertensive but otherwise vital signs are normal.  Is not in respiratory distress, tachypneic, hypoxic.  He was given several rounds of beta agonist treatments and felt better afterwards.  He was given a dose of prednisone here.  He states he feels ready to go home.  Do not think he needs a chest x-ray today or further work-up since he is here often for this problem.  He was encouraged to follow-up with primary care and to try antihistamines.  Return precautions given   Final Clinical Impressions(s) / ED Diagnoses   Final diagnoses:  Moderate asthma with exacerbation, unspecified whether persistent    ED Discharge Orders    None       Beryle QuantGekas, Kelly Marie, PA-C 12/23/18 2326    Arby BarrettePfeiffer, Marcy, MD 01/01/19 [phone]

## 2019-01-06 ENCOUNTER — Emergency Department (HOSPITAL_COMMUNITY)
Admission: EM | Admit: 2019-01-06 | Discharge: 2019-01-06 | Disposition: A | Discharge status: Home / Self Care | Payer: Self-pay | Attending: Emergency Medicine | Admitting: Emergency Medicine

## 2019-01-06 DIAGNOSIS — J4541 Moderate persistent asthma with (acute) exacerbation: Secondary | ICD-10-CM

## 2019-01-06 MED ORDER — AEROCHAMBER PLUS FLO-VU LARGE MISC
Status: AC
  Administered 2019-01-06: 1
  Filled 2019-01-06: qty 1

## 2019-01-06 MED ORDER — PREDNISONE 20 MG PO TABS: 40.0000 mg | ORAL_TABLET | Freq: Every day | ORAL | 0 refills | Status: DC

## 2019-01-06 MED ORDER — ALBUTEROL SULFATE HFA 108 (90 BASE) MCG/ACT IN AERS
6.0000 | INHALATION_SPRAY | Freq: Once | RESPIRATORY_TRACT | Status: AC
  Administered 2019-01-06: 6 via RESPIRATORY_TRACT
  Filled 2019-01-06: qty 6.7

## 2019-01-06 NOTE — ED Provider Notes (Signed)
[facility] EMERGENCY DEPARTMENT Provider Note   CSN: [id number] Arrival date & time: 01/06/19  1756     History   Chief Complaint Chief Complaint  Patient presents with  . Asthma    HPI Jesus Haynes is a 43 y.o. male who presents emergency department chief complaint of asthma exacerbation.  Patient was seen for similar symptoms several weeks ago.  He states that he has a history of asthma and allergies.  Is been using his inhaler more.  This evening he began having wheezing.  He had 6 albuterol puffs in the lobby during his wait to be seen.  He denies fever or chills.  He states that he is feeling much better after intervention.     HPI  Past Medical History:  Diagnosis Date  . Asthma     There are no active problems to display for this patient.   No past surgical history on file.      Home Medications    Prior to Admission medications   Medication Sig Start Date End Date Taking? Authorizing Provider  albuterol (VENTOLIN HFA) 108 (90 Base) MCG/ACT inhaler Inhale 1-2 puffs into the lungs every 6 (six) hours as needed for wheezing or shortness of breath. 11/17/18   Hedges, Dellis Filbert, PA-C  cetirizine (ZYRTEC) 10 MG tablet Take 1 tablet (10 mg total) by mouth daily. 02/23/18   Maczis, Barth Kirks, PA-C  fluticasone (FLONASE) 50 MCG/ACT nasal spray Place 1-2 sprays into both nostrils daily. 02/23/18   Maczis, Barth Kirks, PA-C  predniSONE (DELTASONE) 20 MG tablet Take 2 tablets (40 mg total) by mouth daily. 01/06/19   Margarita Mail, PA-C    Family History No family history on file.  Social History Social History   Tobacco Use  . Smoking status: Former Smoker    Packs/day: 0.50    Types: Cigarettes    Quit date: 12/26/2012    Years since quitting: 6.0  . Smokeless tobacco: Never Used  Substance Use Topics  . Alcohol use: No  . Drug use: No     Allergies   Patient has no known allergies.   Review of Systems Review of Systems Ten systems  reviewed and are negative for acute change, except as noted in the HPI.    Physical Exam Updated Vital Signs BP 132/68   Pulse 62   Temp 98.2 F (36.8 C) (Oral)   Resp 16   SpO2 100%   Physical Exam Vitals signs and nursing note reviewed.  Constitutional:      General: He is not in acute distress.    Appearance: He is well-developed. He is not diaphoretic.  HENT:     Head: Normocephalic and atraumatic.  Eyes:     General: No scleral icterus.    Conjunctiva/sclera: Conjunctivae normal.  Neck:     Musculoskeletal: Normal range of motion and neck supple.  Cardiovascular:     Rate and Rhythm: Normal rate and regular rhythm.     Heart sounds: Normal heart sounds.  Pulmonary:     Effort: Pulmonary effort is normal. No respiratory distress.     Breath sounds: Normal breath sounds. No wheezing.  Abdominal:     Palpations: Abdomen is soft.     Tenderness: There is no abdominal tenderness.  Skin:    General: Skin is warm and dry.  Neurological:     Mental Status: He is alert.  Psychiatric:        Behavior: Behavior normal.  ED Treatments / Results  Labs (all labs ordered are listed, but only abnormal results are displayed) Labs Reviewed - No data to display  EKG None  Radiology No results found.  Procedures Procedures (including critical care time)  Medications Ordered in ED Medications  albuterol (VENTOLIN HFA) 108 (90 Base) MCG/ACT inhaler 6 puff (6 puffs Inhalation Given 01/06/19 2004)  AeroChamber Plus Flo-Vu Large MISC (1 each  Given 01/06/19 2004)     Initial Impression / Assessment and Plan / ED Course  I have reviewed the triage vital signs and the nursing notes.  Pertinent labs & imaging results that were available during my care of the patient were reviewed by me and considered in my medical decision making (see chart for details).        Patient ambulated in ED with O2 saturations maintained >90, no current signs of respiratory distress. Lung  exam improved after albuterol treatment. pt will bd dc with 5 day Prednisone  burst. Pt states they are breathing at baseline. Pt has been instructed to continue using prescribed medications and to speak with PCP about today's exacerbation.    Final Clinical Impressions(s) / ED Diagnoses   Final diagnoses:  Moderate persistent asthma with exacerbation    ED Discharge Orders         Ordered    predniSONE (DELTASONE) 20 MG tablet  Daily     01/06/19 2104           Arthor CaptainHarris, Treylin Burtch, PA-C 01/06/19 2241    Benjiman CorePickering, Nathan, MD 01/17/19 [phone]

## 2019-01-06 NOTE — Discharge Instructions (Addendum)
Get help right away if: You are getting worse and do not respond to treatment during an asthma attack. You are short of breath when at rest or when doing very little physical activity. You have difficulty eating, drinking, or talking. You have chest pain or tightness. You develop a fast heartbeat or palpitations. You have a bluish color to your lips or fingernails. You are light-headed or dizzy, or you faint. Your peak flow reading is less than 50% of your personal best. You feel too tired to breathe normally. 

## 2019-01-06 NOTE — ED Triage Notes (Signed)
Pt reports hx of asthma and used all his inhaler. Here to get another inhaler. No distress.

## 2019-01-12 ENCOUNTER — Encounter (HOSPITAL_COMMUNITY): Payer: Self-pay | Admitting: Emergency Medicine

## 2019-01-12 ENCOUNTER — Emergency Department (HOSPITAL_COMMUNITY)
Admission: EM | Admit: 2019-01-12 | Discharge: 2019-01-12 | Disposition: A | Discharge status: Home / Self Care | Payer: Self-pay | Attending: Emergency Medicine | Admitting: Emergency Medicine

## 2019-01-12 ENCOUNTER — Other Ambulatory Visit: Payer: Self-pay

## 2019-01-12 DIAGNOSIS — Z87891 Personal history of nicotine dependence: Secondary | ICD-10-CM | POA: Insufficient documentation

## 2019-01-12 DIAGNOSIS — Z79899 Other long term (current) drug therapy: Secondary | ICD-10-CM | POA: Insufficient documentation

## 2019-01-12 DIAGNOSIS — Z760 Encounter for issue of repeat prescription: Secondary | ICD-10-CM | POA: Insufficient documentation

## 2019-01-12 DIAGNOSIS — R062 Wheezing: Secondary | ICD-10-CM | POA: Insufficient documentation

## 2019-01-12 MED ORDER — AEROCHAMBER PLUS FLO-VU MEDIUM MISC
1.0000 | Freq: Once | Status: AC
  Administered 2019-01-12: 1
  Filled 2019-01-12: qty 1

## 2019-01-12 MED ORDER — ALBUTEROL SULFATE HFA 108 (90 BASE) MCG/ACT IN AERS
3.0000 | INHALATION_SPRAY | Freq: Once | RESPIRATORY_TRACT | Status: AC
  Administered 2019-01-12: 3 via RESPIRATORY_TRACT
  Filled 2019-01-12: qty 6.7

## 2019-01-12 MED ORDER — ALBUTEROL SULFATE HFA 108 (90 BASE) MCG/ACT IN AERS: 1.0000 | INHALATION_SPRAY | Freq: Four times a day (QID) | RESPIRATORY_TRACT | 1 refills | Status: DC | PRN

## 2019-01-12 NOTE — ED Provider Notes (Signed)
[facility]-EMERGENCY DEPT Provider Note   CSN: [id number] Arrival date & time: 01/12/19  1216    History   Chief Complaint Chief Complaint  Patient presents with  . Asthma  . Medication Refill    HPI Jesus SchaumannShane E Conrad is a 43 y.o. male with a past medical history of asthma presents to ED requesting refill of his inhaler.  States that he was seen and evaluated on 01/06/2019 for asthma exacerbation.  He began taking his prednisone several days after was prescribed and has been using his inhaler as prescribed as well.  However, he did run out of his inhaler several days ago.  He denies any chest pain, leg swelling, fever, hemoptysis.     HPI  Past Medical History:  Diagnosis Date  . Asthma     There are no active problems to display for this patient.   History reviewed. No pertinent surgical history.      Home Medications    Prior to Admission medications   Medication Sig Start Date End Date Taking? Authorizing Provider  albuterol (VENTOLIN HFA) 108 (90 Base) MCG/ACT inhaler Inhale 1-2 puffs into the lungs every 6 (six) hours as needed for wheezing or shortness of breath. 01/12/19   Filip Luten, PA-C  cetirizine (ZYRTEC) 10 MG tablet Take 1 tablet (10 mg total) by mouth daily. 02/23/18   Maczis, Elmer SowMichael M, PA-C  fluticasone (FLONASE) 50 MCG/ACT nasal spray Place 1-2 sprays into both nostrils daily. 02/23/18   Maczis, Elmer SowMichael M, PA-C  predniSONE (DELTASONE) 20 MG tablet Take 2 tablets (40 mg total) by mouth daily. 01/06/19   Arthor CaptainHarris, Abigail, PA-C    Family History No family history on file.  Social History Social History   Tobacco Use  . Smoking status: Former Smoker    Packs/day: 0.50    Types: Cigarettes    Quit date: 12/26/2012    Years since quitting: 6.0  . Smokeless tobacco: Never Used  Substance Use Topics  . Alcohol use: No  . Drug use: No     Allergies   Patient has no known allergies.   Review of Systems Review of Systems   Constitutional: Negative for fever.  Respiratory: Positive for wheezing. Negative for cough, chest tightness and shortness of breath.   Cardiovascular: Negative for chest pain.     Physical Exam Updated Vital Signs BP (!) 149/95 (BP Location: Left Arm)   Pulse 90   Temp 98.4 F (36.9 C) (Oral)   Resp 19   SpO2 98%   Physical Exam Vitals signs and nursing note reviewed.  Constitutional:      General: He is not in acute distress.    Appearance: He is well-developed. He is not diaphoretic.     Comments: Speaking in complete sentences without difficulty.  HENT:     Head: Normocephalic and atraumatic.  Eyes:     General: No scleral icterus.    Conjunctiva/sclera: Conjunctivae normal.  Neck:     Musculoskeletal: Normal range of motion.  Cardiovascular:     Rate and Rhythm: Normal rate and regular rhythm.  Pulmonary:     Effort: Pulmonary effort is normal. No respiratory distress.     Breath sounds: Wheezing (faint end expiratory wheezing in bilateral lung fields) present.  Skin:    Findings: No rash.  Neurological:     Mental Status: He is alert.      ED Treatments / Results  Labs (all labs ordered are listed, but only abnormal results are displayed)  Labs Reviewed - No data to display  EKG None  Radiology No results found.  Procedures Procedures (including critical care time)  Medications Ordered in ED Medications  albuterol (VENTOLIN HFA) 108 (90 Base) MCG/ACT inhaler 3 puff (has no administration in time range)  AeroChamber Plus Flo-Vu Medium MISC 1 each (has no administration in time range)     Initial Impression / Assessment and Plan / ED Course  I have reviewed the triage vital signs and the nursing notes.  Pertinent labs & imaging results that were available during my care of the patient were reviewed by me and considered in my medical decision making (see chart for details).        43 year old male with past medical history of asthma presents  to ED for refill of inhaler.  States that he ran out several days ago, has been compliant with the prednisone he was prescribed at his visit last week.  Notes ongoing wheezing which she states usually improved with his inhaler. Slight end expiratory wheezing noted on exam however patient is in no acute distress, speaking complete sentences without difficulty. Provided with inhaler and refill here and advised to continue prednisone. Return for worse.  Patient is hemodynamically stable, in NAD, and able to ambulate in the ED. Evaluation does not show pathology that would require ongoing emergent intervention or inpatient treatment. I explained the diagnosis to the patient. Pain has been managed and has no complaints prior to discharge. Patient is comfortable with above plan and is stable for discharge at this time. All questions were answered prior to disposition. Strict return precautions for returning to the ED were discussed. Encouraged follow up with PCP.   An After Visit Summary was printed and given to the patient.   Portions of this note were generated with Lobbyist. Dictation errors may occur despite best attempts at proofreading.   Final Clinical Impressions(s) / ED Diagnoses   Final diagnoses:  Encounter for medication refill    ED Discharge Orders         Ordered    albuterol (VENTOLIN HFA) 108 (90 Base) MCG/ACT inhaler  Every 6 hours PRN     01/12/19 1512           Delia Heady, PA-C 01/12/19 1513    Carmin Muskrat, MD 01/13/19 1513

## 2019-01-12 NOTE — Discharge Instructions (Signed)
Use the inhaler as directed. Continue your prednisone as directed to help with your asthma exacerbation. Return to the ED if you start to experience worsening symptoms, worsening shortness of breath, chest pain, leg swelling.

## 2019-01-12 NOTE — ED Triage Notes (Signed)
Pt reports that he ran out of inhaler was given at [facility] 2 weeks ago and only given prescription for prednisone.

## 2019-01-26 ENCOUNTER — Other Ambulatory Visit: Payer: Self-pay

## 2019-01-26 ENCOUNTER — Encounter (HOSPITAL_COMMUNITY): Payer: Self-pay | Admitting: Emergency Medicine

## 2019-01-26 ENCOUNTER — Emergency Department (HOSPITAL_COMMUNITY)
Admission: EM | Admit: 2019-01-26 | Discharge: 2019-01-26 | Disposition: A | Discharge status: Home / Self Care | Payer: Self-pay | Attending: Emergency Medicine | Admitting: Emergency Medicine

## 2019-01-26 DIAGNOSIS — J4541 Moderate persistent asthma with (acute) exacerbation: Secondary | ICD-10-CM | POA: Insufficient documentation

## 2019-01-26 DIAGNOSIS — Z87891 Personal history of nicotine dependence: Secondary | ICD-10-CM | POA: Insufficient documentation

## 2019-01-26 MED ORDER — ALBUTEROL SULFATE HFA 108 (90 BASE) MCG/ACT IN AERS
4.0000 | INHALATION_SPRAY | Freq: Once | RESPIRATORY_TRACT | Status: AC
  Administered 2019-01-26: 07:00:00 4 via RESPIRATORY_TRACT
  Filled 2019-01-26: qty 6.7

## 2019-01-26 MED ORDER — PREDNISONE 10 MG PO TABS: 40.0000 mg | ORAL_TABLET | Freq: Every day | ORAL | 0 refills | Status: AC

## 2019-01-26 MED ORDER — ALBUTEROL SULFATE HFA 108 (90 BASE) MCG/ACT IN AERS: 1.0000 | INHALATION_SPRAY | Freq: Four times a day (QID) | RESPIRATORY_TRACT | 2 refills | Status: DC | PRN

## 2019-01-26 NOTE — ED Notes (Signed)
Patient verbalizes understanding of discharge instructions. Opportunity for questioning and answers were provided. Armband removed by staff, pt discharged from ED.  

## 2019-01-26 NOTE — ED Provider Notes (Signed)
[facility] EMERGENCY DEPARTMENT Provider Note  CSN: [id number] Arrival date & time: 01/26/19 0547  Chief Complaint(s) Shortness of Breath  HPI Jesus Haynes is a 43 y.o. 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  Patient reports that he ran out of his albuterol inhaler yesterday.  Reports that he tends to use the inhaler as needed but typically uses it daily due to his symptoms.  He denies any recent fevers or infections.  No cough or congestion.  No nausea or vomiting.  No chest pain.  No known sick contacts.  Denies any other physical complaints.  Patient attempted to drive himself to the emergency department but had to stop due to feeling lightheaded.  EMS was called and found the patient to be hypoxic in the 70s on room air.  Patient was treated with a round of DuoNeb, given Solu-Medrol, magnesium and epinephrine which provided the patient with significant relief.  Reports that he feels much better.   HPI  Past Medical History Past Medical History:  Diagnosis Date  . Asthma    There are no active problems to display for this patient.  Home Medication(s) Prior to Admission medications   Medication Sig Start Date End Date Taking? Authorizing Provider  albuterol (VENTOLIN HFA) 108 (90 Base) MCG/ACT inhaler Inhale 1-2 puffs into the lungs every 6 (six) hours as needed for wheezing or shortness of breath. 01/12/19  Yes Khatri, Hina, PA-C  cetirizine (ZYRTEC) 10 MG tablet Take 1 tablet (10 mg total) by mouth daily. Patient not taking: Reported on 01/26/2019 02/23/18   Jillyn Ledger, PA-C  fluticasone [facility]) 50 MCG/ACT nasal spray Place 1-2 sprays into both nostrils daily. Patient not taking: Reported on 01/26/2019 02/23/18   Maczis, Barth Kirks, PA-C  predniSONE (DELTASONE) 20 MG tablet Take 2 tablets (40 mg total) by  mouth daily. Patient not taking: Reported on 01/26/2019 01/06/19   Margarita Mail, PA-C                                                                                                                                    Past Surgical History History reviewed. No pertinent surgical history. Family History No family history on file.  Social History Social History   Tobacco Use  . Smoking status: Former Smoker    Packs/day: 0.50    Types: Cigarettes    Quit date: 12/26/2012    Years since quitting: 6.0  . Smokeless tobacco: Never Used  Substance Use Topics  . Alcohol use: No  . Drug use: No   Allergies Patient has no known allergies.  Review of Systems Review of Systems All other systems are reviewed and are negative for acute change except as noted in the HPI  Physical Exam Vital Signs  I  have reviewed the triage vital signs BP 125/83   Pulse 73   Temp 97.6 F (36.4 C) (Oral)   Resp 16   Ht 5\' 6"  (1.676 m)   Wt 79.4 kg   SpO2 94%   BMI 28.25 kg/m   Physical Exam Vitals signs reviewed.  Constitutional:      General: He is not in acute distress.    Appearance: He is well-developed. He is not diaphoretic.  HENT:     Head: Normocephalic and atraumatic.     Nose: Nose normal.  Eyes:     General: No scleral icterus.       Right eye: No discharge.        Left eye: No discharge.     Conjunctiva/sclera: Conjunctivae normal.     Pupils: Pupils are equal, round, and reactive to light.  Neck:     Musculoskeletal: Normal range of motion and neck supple.  Cardiovascular:     Rate and Rhythm: Normal rate and regular rhythm.     Heart sounds: No murmur. No friction rub. No gallop.   Pulmonary:     Effort: Pulmonary effort is normal. No respiratory distress.     Breath sounds: No stridor. Wheezing (faint intermittent wheezing throughout) present. No rhonchi or rales.  Abdominal:     General: There is no distension.     Palpations: Abdomen is soft.     Tenderness: There is  no abdominal tenderness.  Musculoskeletal:        General: No tenderness.  Skin:    General: Skin is warm and dry.     Findings: No erythema or rash.  Neurological:     Mental Status: He is alert and oriented to person, place, and time.     ED Results and Treatments Labs (all labs ordered are listed, but only abnormal results are displayed) Labs Reviewed - No data to display                                                                                                                       EKG  EKG Interpretation  Date/Time:  Monday January 26 2019 05:53:34 EDT Ventricular Rate:  80 PR Interval:    QRS Duration: 88 QT Interval:  386 QTC Calculation: 446 R Axis:   74 Text Interpretation:  Sinus rhythm Confirmed by Drema Pryardama, Pedro [phone]([id number]) on 01/26/2019 6:49:03 AM      Radiology No results found.  Pertinent labs & imaging results that were available during my care of the patient were reviewed by me and considered in my medical decision making (see chart for details).  Medications Ordered in ED Medications  albuterol (VENTOLIN HFA) 108 (90 Base) MCG/ACT inhaler 4 puff (has no administration in time range)  Procedures Procedures  (including critical care time)  Medical Decision Making / ED Course I have reviewed the nursing notes for this encounter and the patient's prior records (if available in EHR or on provided paperwork).   Dennard SchaumannShane E Yniguez was evaluated in Emergency Department on 01/26/2019 for the symptoms described in the history of present illness. He was evaluated in the context of the global COVID-19 pandemic, which necessitated consideration that the patient might be at risk for infection with the SARS-CoV-2 virus that causes COVID-19. Institutional protocols and algorithms that pertain to the evaluation of patients at risk for COVID-19 are  in a state of rapid change based on information released by regulatory bodies including the CDC and federal and state organizations. These policies and algorithms were followed during the patient's care in the ED.  Asthma exacerbation. Significant improvement following EMS management.. Able to speak in full sentences.  Still has mild intermittent wheezing.  Will provide with albuterol polyps.  We will continue to monitor for at least 2 hours to ensure no recurrence.  If stable patient will be discharged with prescription for oral steroids.  He already has a refill for his albuterol inhaler.      Final Clinical Impression(s) / ED Diagnoses Final diagnoses:  Moderate persistent asthma with exacerbation      This chart was dictated using voice recognition software.  Despite best efforts to proofread,  errors can occur which can change the documentation meaning.   Nira Connardama, Pedro Eduardo, MD 01/26/19 [id number]

## 2019-01-26 NOTE — ED Triage Notes (Signed)
Pt woke up, sob "felt like an asthma attack."  He tried to drive himself to ED but became dizzy and called EMS.  EMS found him 78% on room air, wheezing upper lobe w/ absent low lobe sounds. He was pale/diaphoretic, tacy.  Gave: .3 epi, 125 solumedrol, 2g mag, 5 mg albuterol and 2.5 Atrovent.  Came in on 15L non-rebreather 94%, 150/86, heart rate 100.  No COVID symptoms

## 2019-01-26 NOTE — ED Provider Notes (Signed)
Pt is now feeling much better and wheezing resolved.  Pt had run out of inhaler which is probably what caused the severe attack.  Pt given ppx for prednisone and inhaler.   Blanchie Dessert, MD 01/26/19 [phone]

## 2019-01-26 NOTE — ED Notes (Signed)
Patient was able to walk up and down the hall with ease. Patient denied any SOB and CP. Patient stated he feels much better.

## 2019-02-19 ENCOUNTER — Emergency Department (HOSPITAL_COMMUNITY)
Admission: EM | Admit: 2019-02-19 | Discharge: 2019-02-19 | Disposition: A | Discharge status: Home / Self Care | Payer: Self-pay | Attending: Emergency Medicine | Admitting: Emergency Medicine

## 2019-02-19 ENCOUNTER — Other Ambulatory Visit: Payer: Self-pay

## 2019-02-19 ENCOUNTER — Encounter (HOSPITAL_COMMUNITY): Payer: Self-pay | Admitting: Emergency Medicine

## 2019-02-19 DIAGNOSIS — Z87891 Personal history of nicotine dependence: Secondary | ICD-10-CM | POA: Insufficient documentation

## 2019-02-19 DIAGNOSIS — J4520 Mild intermittent asthma, uncomplicated: Secondary | ICD-10-CM | POA: Insufficient documentation

## 2019-02-19 MED ORDER — ALBUTEROL SULFATE HFA 108 (90 BASE) MCG/ACT IN AERS
1.0000 | INHALATION_SPRAY | Freq: Once | RESPIRATORY_TRACT | Status: AC
  Administered 2019-02-19: 1 via RESPIRATORY_TRACT
  Filled 2019-02-19: qty 6.7

## 2019-02-19 MED ORDER — ALBUTEROL SULFATE HFA 108 (90 BASE) MCG/ACT IN AERS: 1.0000 | INHALATION_SPRAY | Freq: Four times a day (QID) | RESPIRATORY_TRACT | 6 refills | Status: DC | PRN

## 2019-02-19 NOTE — ED Notes (Signed)
Patient Alert and oriented to baseline. Stable and ambulatory to baseline. Patient verbalized understanding of the discharge instructions.  Patient belongings were taken by the patient.   

## 2019-02-19 NOTE — Discharge Instructions (Addendum)
Please read attached information. If you experience any new or worsening signs or symptoms please return to the emergency room for evaluation. Please follow-up with your primary care provider or specialist as discussed. Please use medication prescribed only as directed and discontinue taking if you have any concerning signs or symptoms.   °

## 2019-02-19 NOTE — ED Notes (Signed)
.  bfd 

## 2019-02-19 NOTE — ED Provider Notes (Signed)
[name] EMERGENCY DEPARTMENT Provider Note   CSN: [id number] Arrival date & time: 02/19/19  1425     History   Chief Complaint Chief Complaint  Patient presents with  . Asthma    HPI Jesus Haynes is a 43 y.o. male.     HPI   43 year old male presents today with complaints of asthma.  He notes a significant past medical history of the same.  He has been on inhaled corticosteroids previously but notes he cannot afford them.  He notes that the albuterol that we give him in the emergency room works better than that prescribed.  He notes that he usually wakes up in the night with shortness of breath typical of his asthma exacerbation.  He does have an inhaler now but is almost out of it and would like a refill here.  He denies any significant changes to his asthma no significant shortness of breath now but he does note some tightness.  He denies any cough or fever.      Past Medical History:  Diagnosis Date  . Asthma     There are no active problems to display for this patient.   History reviewed. No pertinent surgical history.      Home Medications    Prior to Admission medications   Medication Sig Start Date End Date Taking? Authorizing Provider  albuterol (VENTOLIN HFA) 108 (90 Base) MCG/ACT inhaler Inhale 1-2 puffs into the lungs every 6 (six) hours as needed for wheezing or shortness of breath. 02/19/19   Shayla Heming, Dellis Filbert, PA-C  cetirizine (ZYRTEC) 10 MG tablet Take 1 tablet (10 mg total) by mouth daily. Patient not taking: Reported on 01/26/2019 02/23/18   Jillyn Ledger, PA-C  fluticasone [facility]) 50 MCG/ACT nasal spray Place 1-2 sprays into both nostrils daily. Patient not taking: Reported on 01/26/2019 02/23/18   Jillyn Ledger, PA-C    Family History History reviewed. No pertinent family history.  Social History Social History   Tobacco Use  . Smoking status: Former Smoker    Packs/day: 0.50    Types: Cigarettes    Quit date:  12/26/2012    Years since quitting: 6.1  . Smokeless tobacco: Never Used  Substance Use Topics  . Alcohol use: No  . Drug use: No     Allergies   Patient has no known allergies.   Review of Systems Review of Systems  All other systems reviewed and are negative.    Physical Exam Updated Vital Signs BP 138/84 (BP Location: Right Arm)   Pulse 87   Temp 97.9 F (36.6 C) (Oral)   Resp 18   Ht 5\' 6"  (1.676 m)   Wt 79.4 kg   SpO2 98%   BMI 28.25 kg/m   Physical Exam Vitals signs and nursing note reviewed.  Constitutional:      Appearance: He is well-developed.  HENT:     Head: Normocephalic and atraumatic.  Eyes:     General: No scleral icterus.       Right eye: No discharge.        Left eye: No discharge.     Conjunctiva/sclera: Conjunctivae normal.     Pupils: Pupils are equal, round, and reactive to light.  Neck:     Musculoskeletal: Normal range of motion.     Vascular: No JVD.     Trachea: No tracheal deviation.  Pulmonary:     Effort: Pulmonary effort is normal.     Breath sounds: No stridor.  Comments: Minor bilateral expiratory wheeze, no crackles or rails, and expansion normal, no respiratory distress Neurological:     Mental Status: He is alert and oriented to person, place, and time.     Coordination: Coordination normal.  Psychiatric:        Behavior: Behavior normal.        Thought Content: Thought content normal.        Judgment: Judgment normal.      ED Treatments / Results  Labs (all labs ordered are listed, but only abnormal results are displayed) Labs Reviewed - No data to display  EKG None  Radiology No results found.  Procedures Procedures (including critical care time)  Medications Ordered in ED Medications  albuterol (VENTOLIN HFA) 108 (90 Base) MCG/ACT inhaler 1 puff (1 puff Inhalation Given 02/19/19 1604)     Initial Impression / Assessment and Plan / ED Course  I have reviewed the triage vital signs and the nursing  notes.  Pertinent labs & imaging results that were available during my care of the patient were reviewed by me and considered in my medical decision making (see chart for details).        43 year old male presents today with asthma this is uncomplicated.  Patient given an albuterol inhaler here and refills.  I have encouraged him to follow-up as an outpatient with primary care for ongoing evaluation and management he is given strict return precautions.  Verbalized understanding and agreement to today's plan had no further questions concerns the time discharge.  Final Clinical Impressions(s) / ED Diagnoses   Final diagnoses:  Mild intermittent asthma without complication    ED Discharge Orders         Ordered    albuterol (VENTOLIN HFA) 108 (90 Base) MCG/ACT inhaler  Every 6 hours PRN     02/19/19 1555           Rosalio LoudHedges, Avier Jech, PA-C 02/19/19 1857    Blane OharaZavitz, Joshua, MD 02/21/19 [phone]

## 2019-02-19 NOTE — ED Triage Notes (Signed)
Pt reports asthma attack beginning yesterday. Ran out of inhaler today. Denies fevers/cough. Mild wheezing noted.

## 2019-03-08 IMAGING — DX DG CHEST 2V
2 series · 2 of 2 positions shown · non-contrast
Comparison: March 16, 2017

CLINICAL DATA: Shortness of breath for 24 hours

EXAM:
CHEST  2 VIEW

[chest lat]
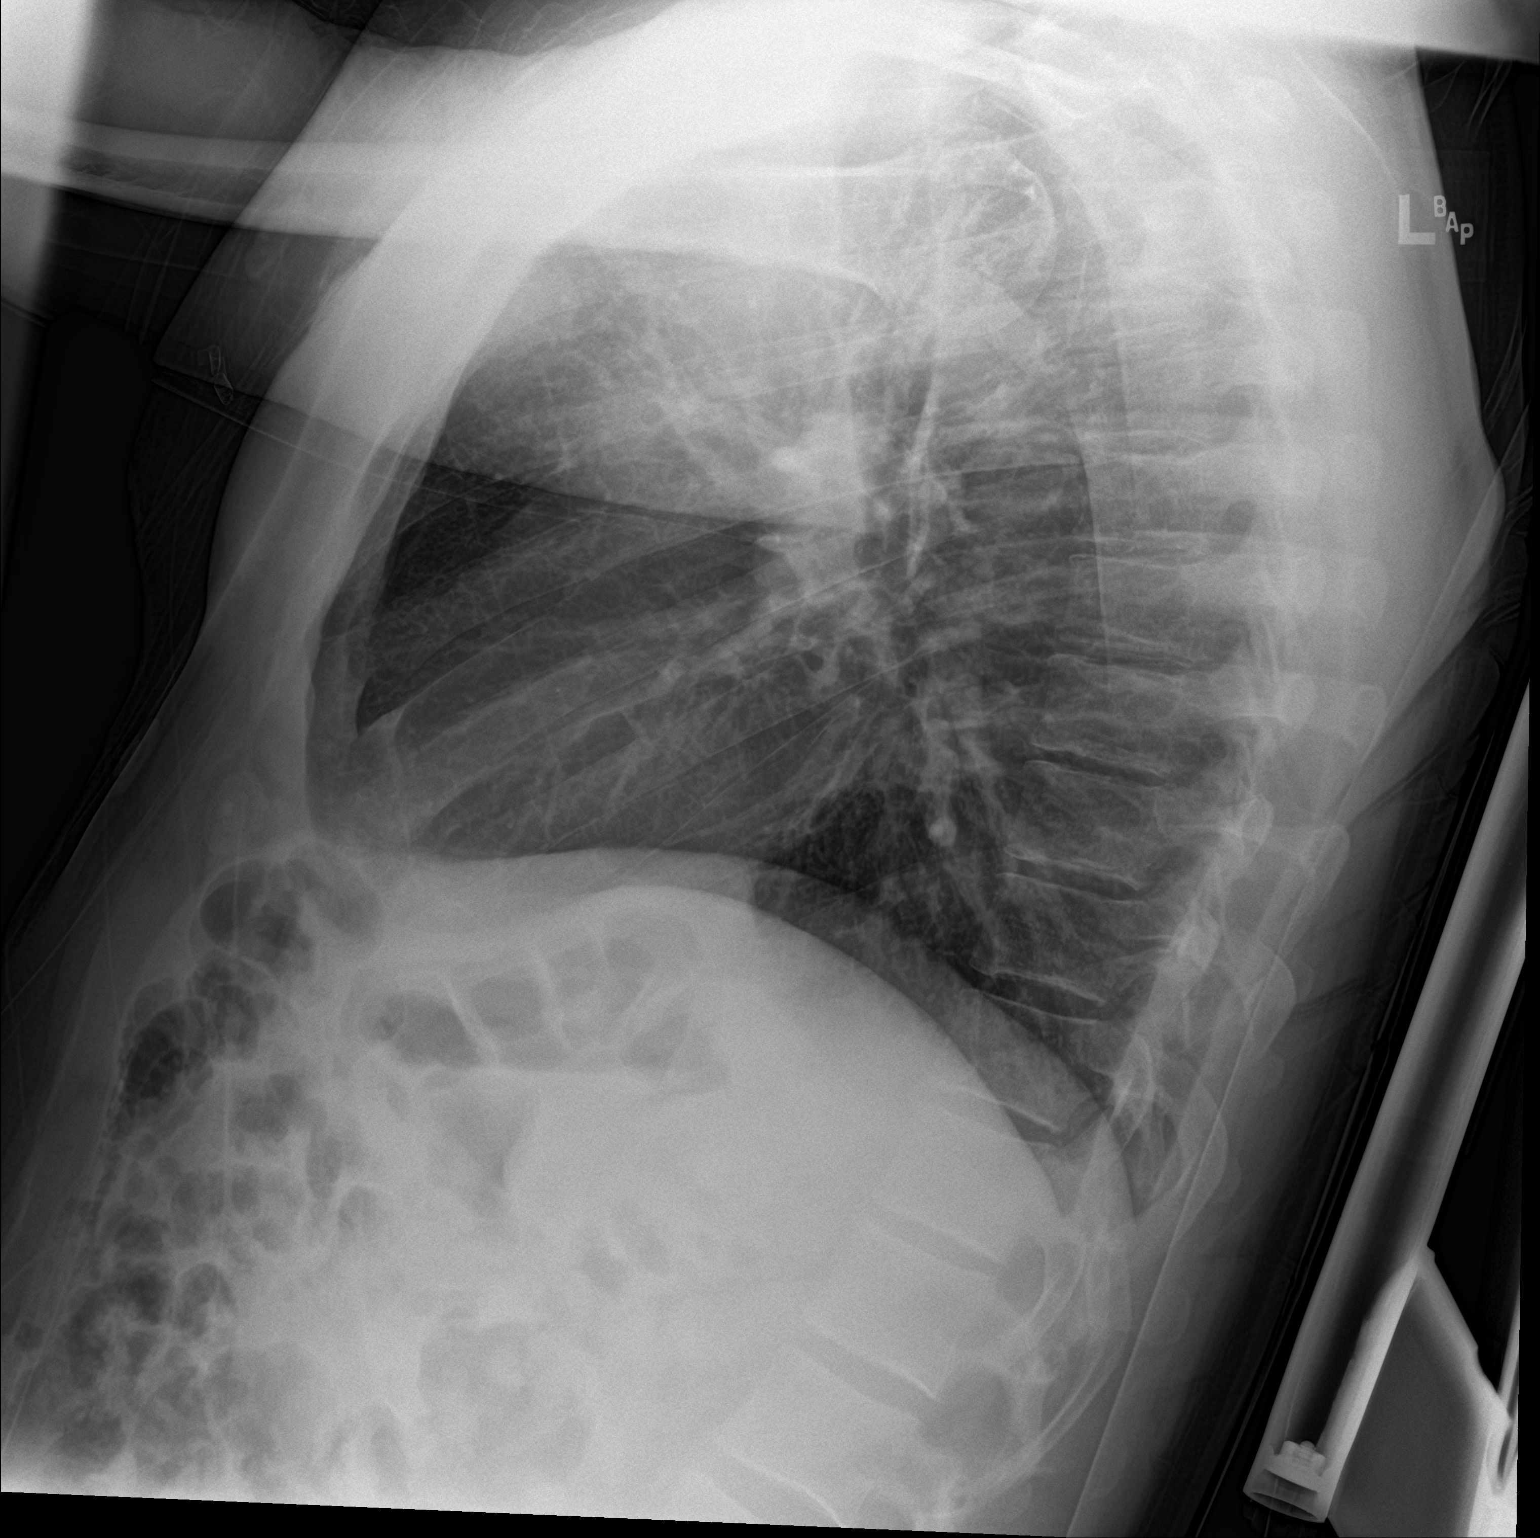

[chest ap]
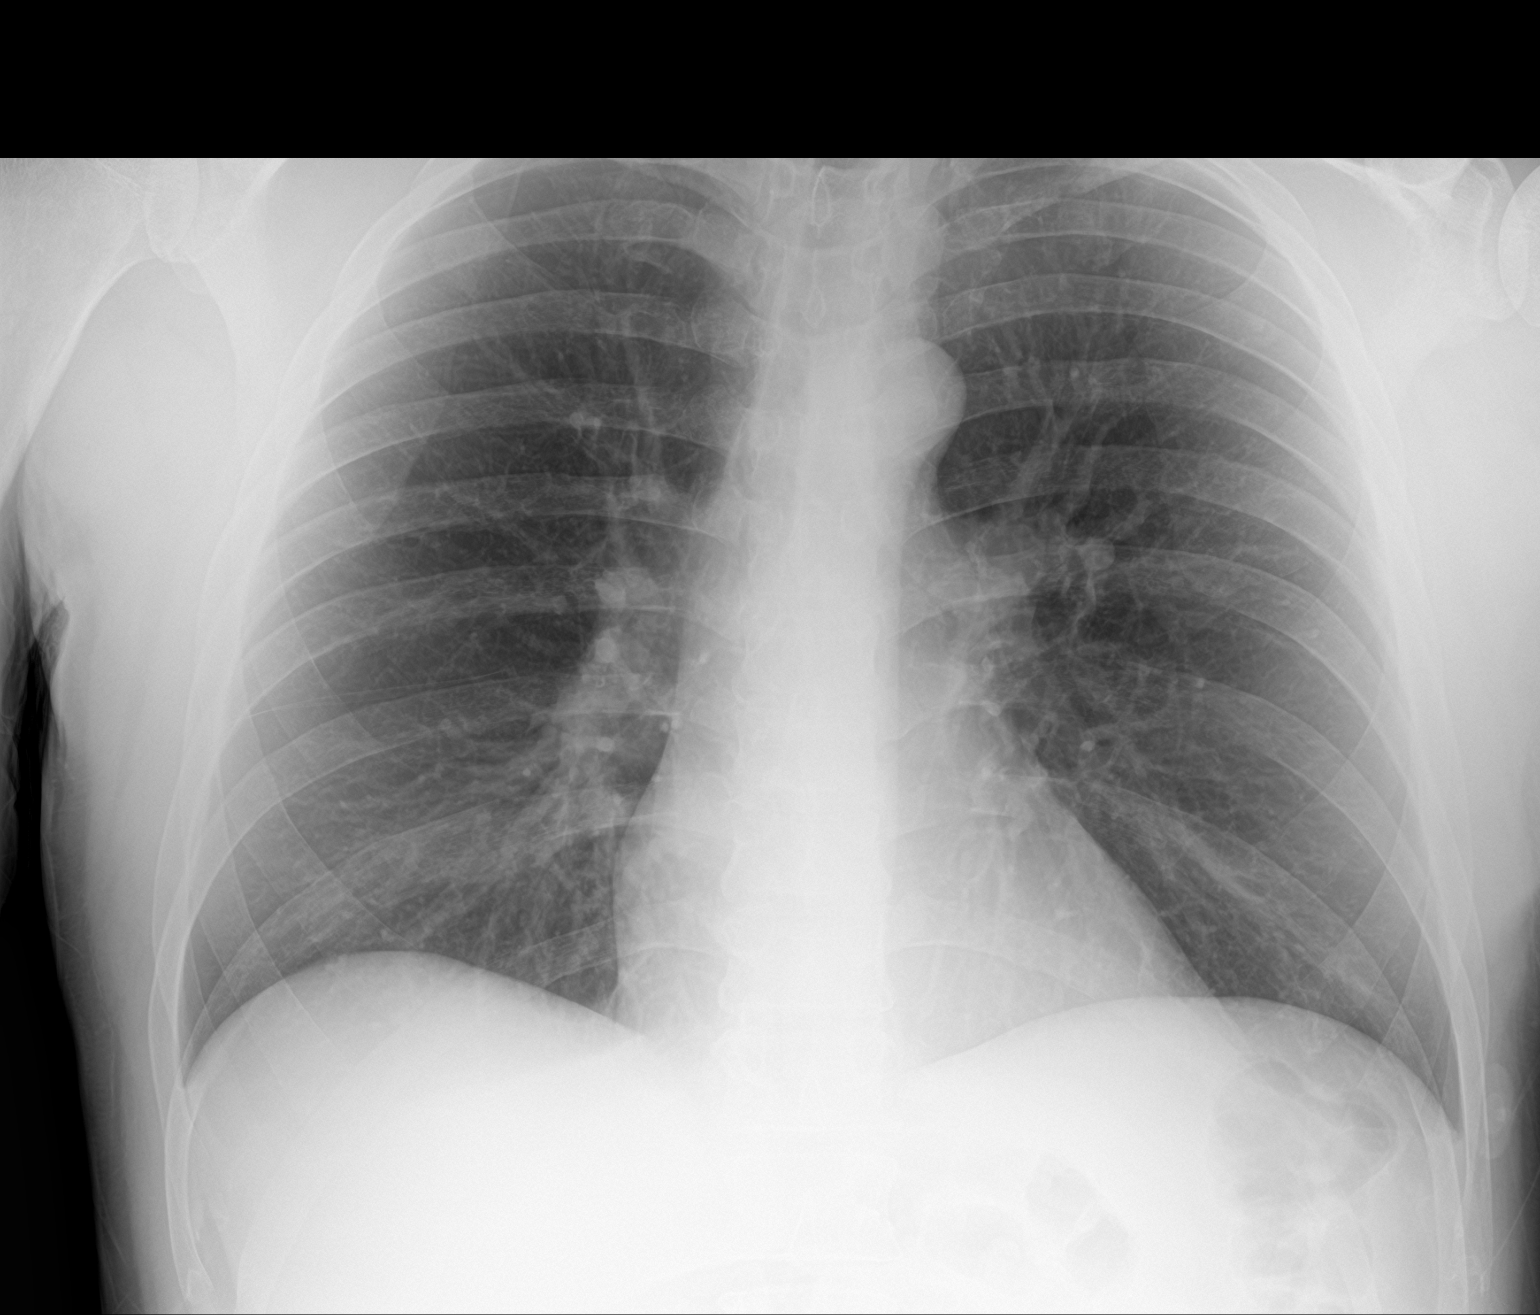

[2 of 2 positions shown; findings below may reference images not displayed]

FINDINGS: The heart size and mediastinal contours are within normal limits.
There is no focal infiltrate, pulmonary edema, or pleural effusion.
The visualized skeletal structures are unremarkable.
IMPRESSION: No active cardiopulmonary disease.

## 2019-03-29 IMAGING — CR DG CHEST 2V
2 series · 2 of 2 positions shown · non-contrast
Comparison: 07/18/2017

CLINICAL DATA: Continued cough and shortness of breath
posttreatment for flu 3 weeks ago.

EXAM:
CHEST  2 VIEW

[w chest pa]
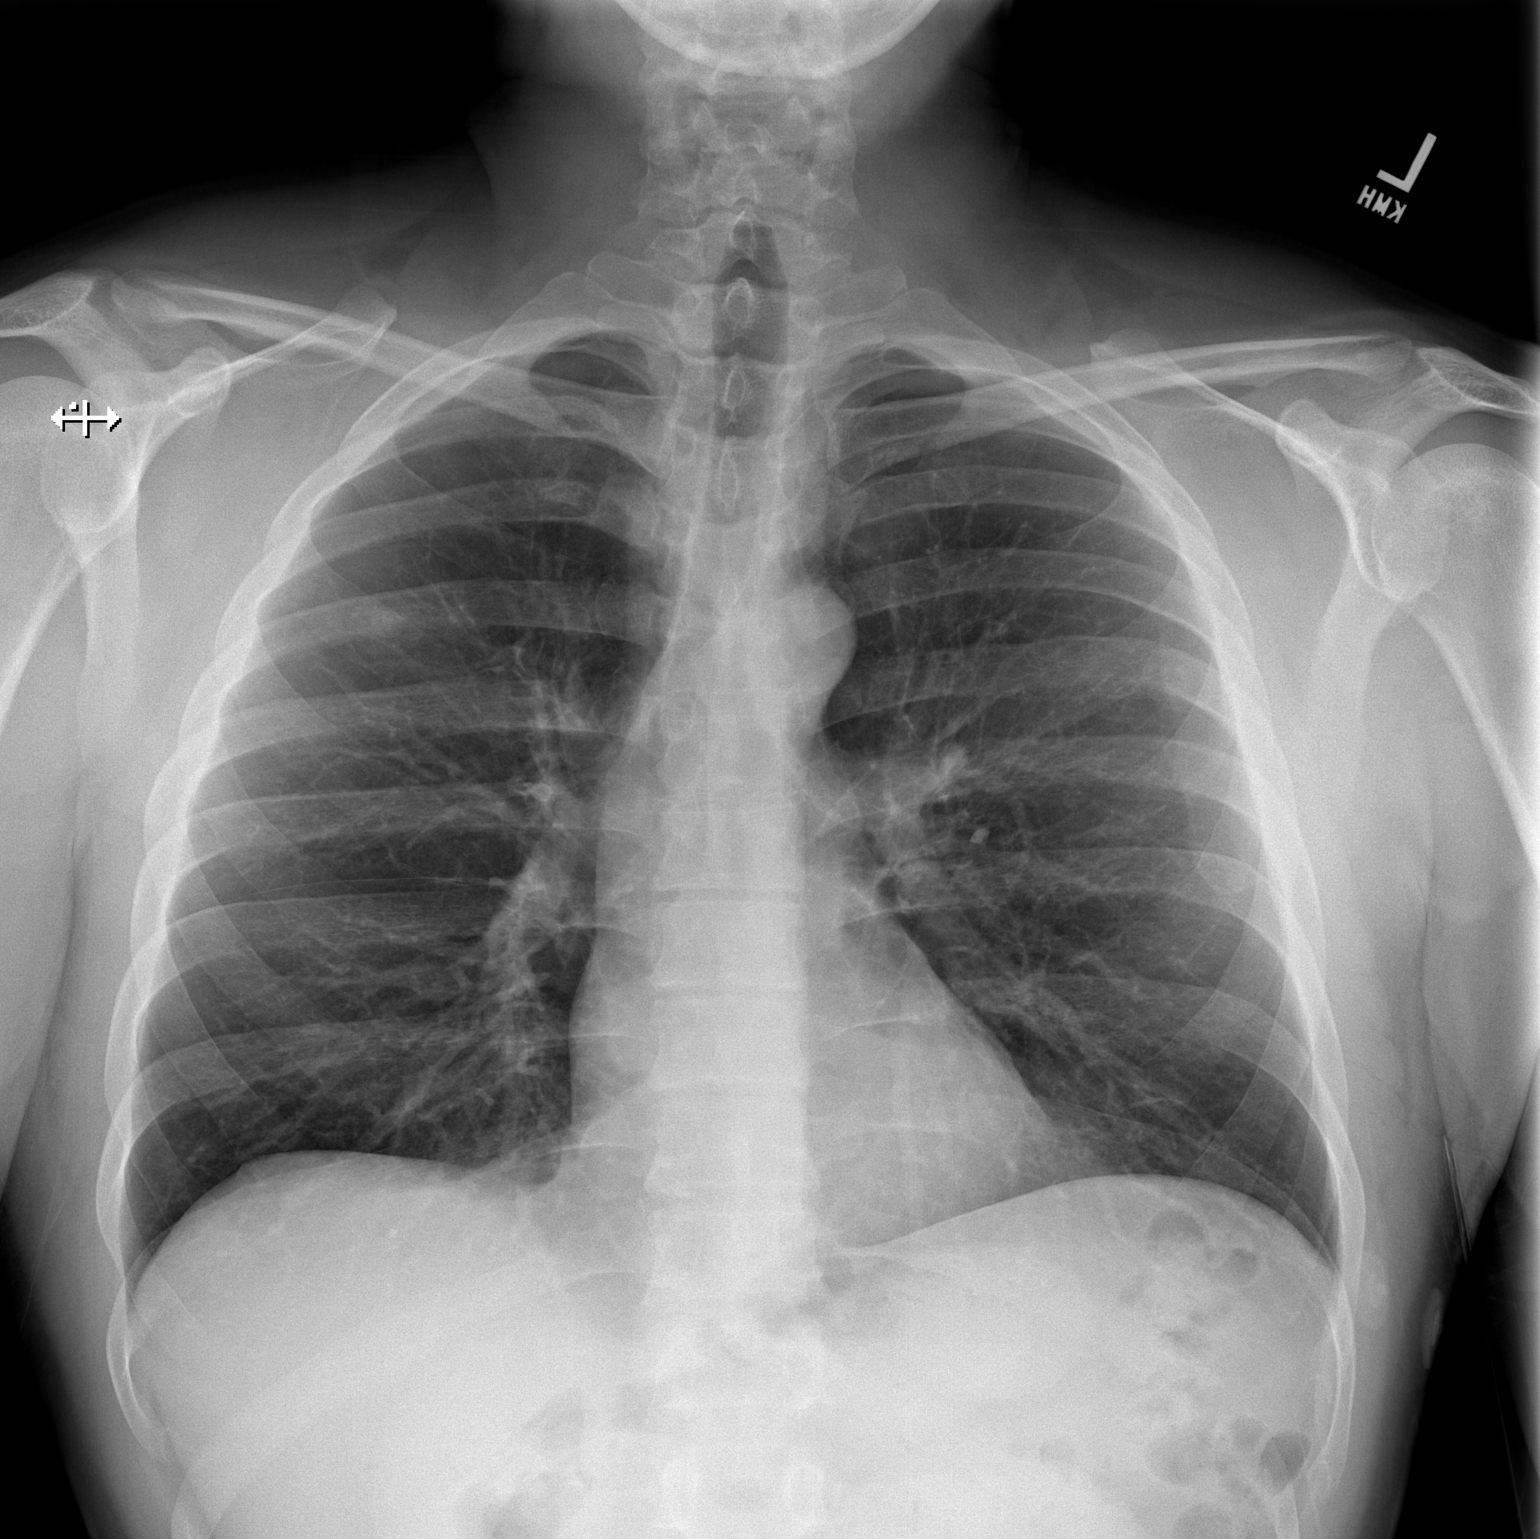

[w chest lat]
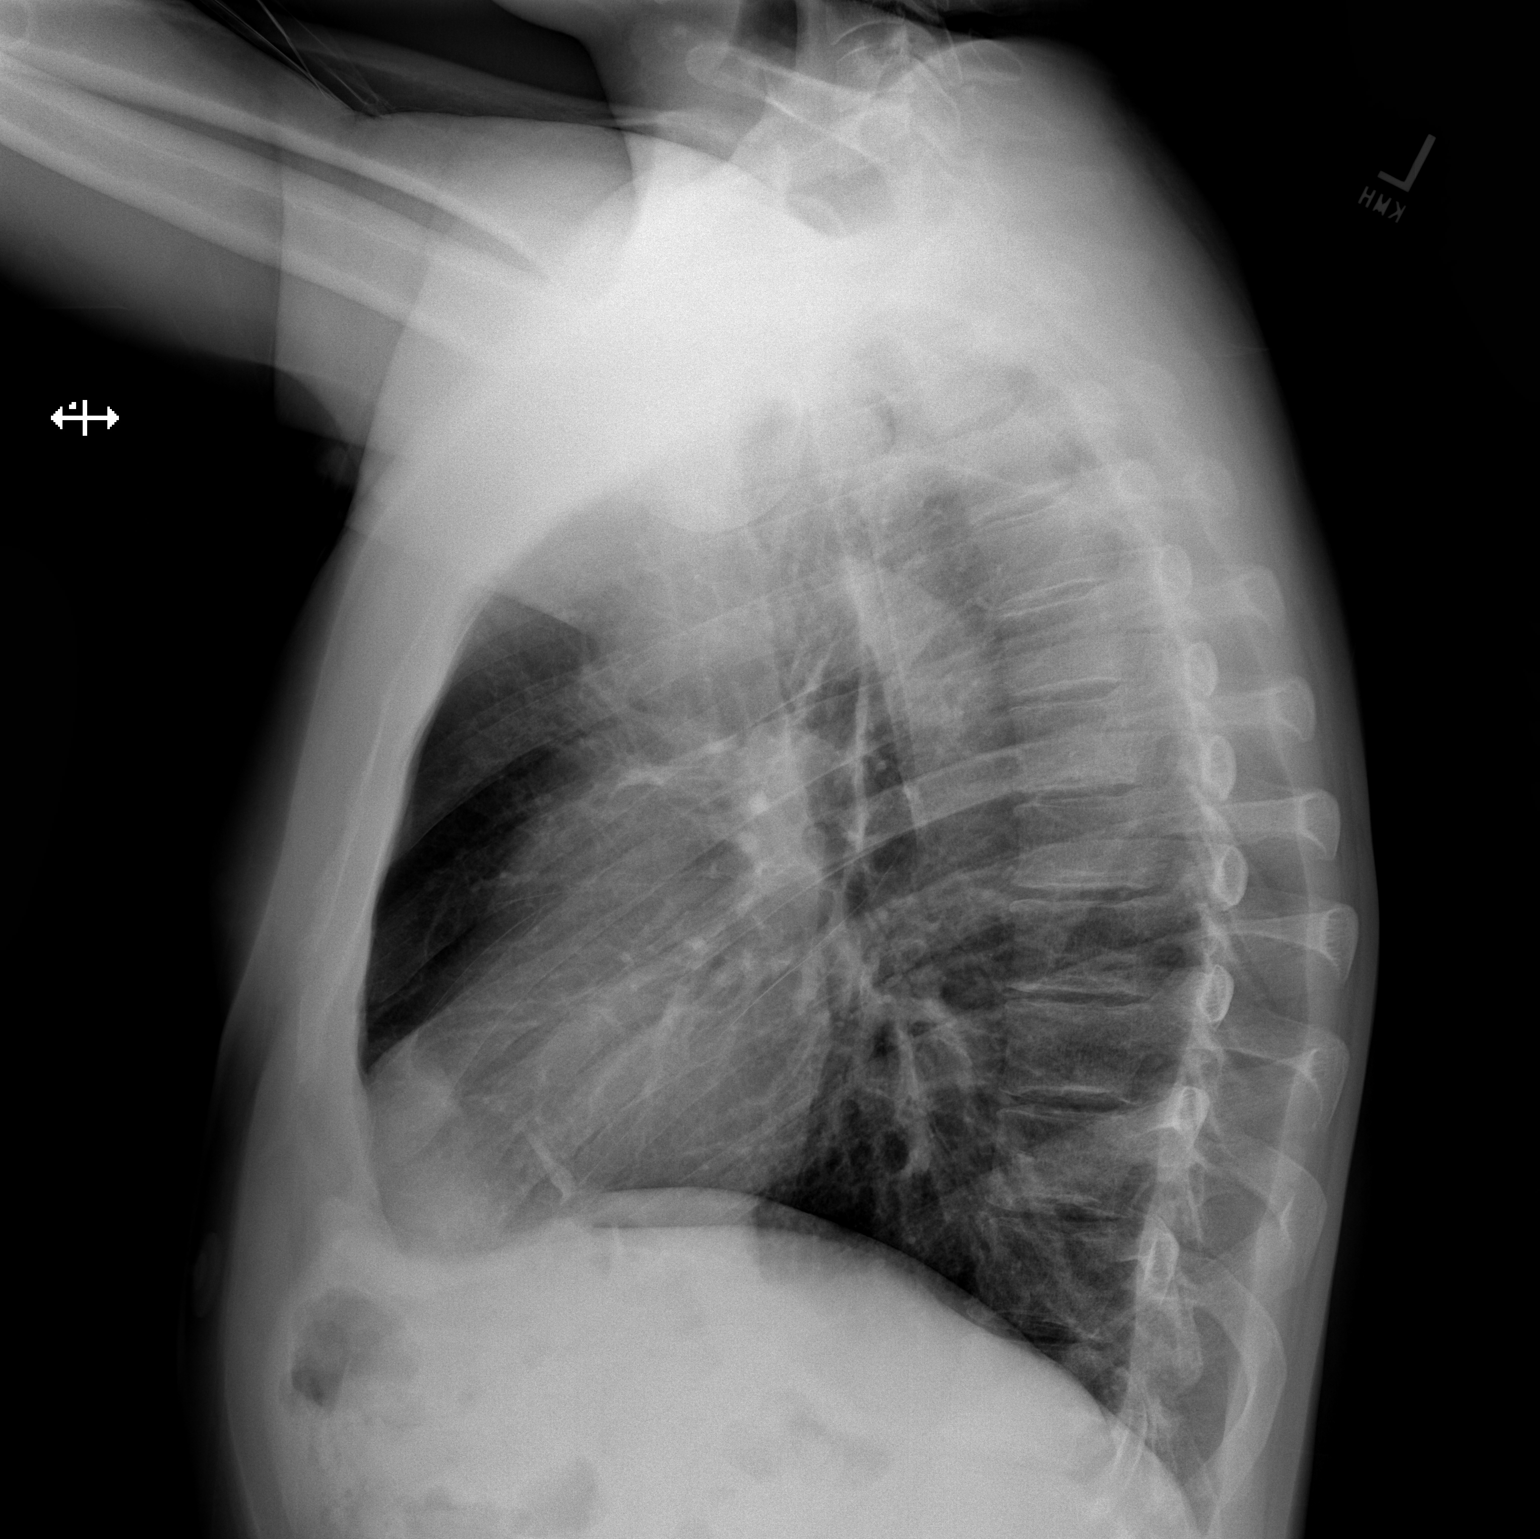

[2 of 2 positions shown; findings below may reference images not displayed]

FINDINGS: Cardiomediastinal silhouette is normal. Mediastinal contours appear
intact.

There is no evidence pleural effusion or pneumothorax. Vague soft
tissue density overlies the right superior hemithorax, seen on the
frontal view.

Osseous structures are without acute abnormality. Soft tissues are
grossly normal.
IMPRESSION: Vague soft tissue density overlies the right superior hemithorax,
seen on the frontal view. This may represent a pulmonary nodule or
small focus of airspace consolidation. Follow-up may be obtained
with contrast-enhanced chest CT, or chest radiograph in a few weeks.

## 2019-04-17 ENCOUNTER — Emergency Department (HOSPITAL_COMMUNITY)
Admission: EM | Admit: 2019-04-17 | Discharge: 2019-04-17 | Disposition: A | Discharge status: Home / Self Care | Payer: Self-pay | Attending: Emergency Medicine | Admitting: Emergency Medicine

## 2019-04-17 ENCOUNTER — Other Ambulatory Visit: Payer: Self-pay

## 2019-04-17 ENCOUNTER — Encounter (HOSPITAL_COMMUNITY): Payer: Self-pay

## 2019-04-17 DIAGNOSIS — J4520 Mild intermittent asthma, uncomplicated: Secondary | ICD-10-CM | POA: Insufficient documentation

## 2019-04-17 DIAGNOSIS — Z87891 Personal history of nicotine dependence: Secondary | ICD-10-CM | POA: Insufficient documentation

## 2019-04-17 MED ORDER — PREDNISONE 20 MG PO TABS
60.0000 mg | ORAL_TABLET | Freq: Once | ORAL | Status: AC
  Administered 2019-04-17: 60 mg via ORAL
  Filled 2019-04-17: qty 3

## 2019-04-17 MED ORDER — ALBUTEROL SULFATE HFA 108 (90 BASE) MCG/ACT IN AERS: 2.0000 | INHALATION_SPRAY | Freq: Four times a day (QID) | RESPIRATORY_TRACT | 0 refills | Status: DC | PRN

## 2019-04-17 MED ORDER — ALBUTEROL SULFATE HFA 108 (90 BASE) MCG/ACT IN AERS
4.0000 | INHALATION_SPRAY | Freq: Once | RESPIRATORY_TRACT | Status: AC
  Administered 2019-04-17: 18:00:00 4 via RESPIRATORY_TRACT
  Filled 2019-04-17: qty 6.7

## 2019-04-17 MED ORDER — ALBUTEROL SULFATE (2.5 MG/3ML) 0.083% IN NEBU: 2.5000 mg | INHALATION_SOLUTION | Freq: Four times a day (QID) | RESPIRATORY_TRACT | 12 refills | Status: DC | PRN

## 2019-04-17 MED ORDER — PREDNISONE 50 MG PO TABS: 50.0000 mg | ORAL_TABLET | Freq: Every day | ORAL | 0 refills | Status: DC

## 2019-04-17 NOTE — ED Notes (Signed)
ED Provider at bedside. 

## 2019-04-17 NOTE — ED Triage Notes (Signed)
Patient states he has been using an inhaler from the drug store and is not working. Patient is having expiratory wheezing.

## 2019-04-17 NOTE — Discharge Instructions (Signed)
Take prednisone as prescribed until completed.  Use albuterol inhaler every 6 hours as needed for wheezing or shortness of breath.  Please establish care with one of the primary care offices for further evaluation and treatment of your asthma.  Please return to the emergency department if you develop any new or worsening symptoms.

## 2019-04-17 NOTE — ED Provider Notes (Signed)
[facility] DEPT Provider Note   CSN: [id number] Arrival date & time: 04/17/19  1639     History   Chief Complaint Chief Complaint  Patient presents with  . Asthma    HPI Jesus Haynes is a 43 y.o. male with history of asthma who presents with wheezing and chest tightness.  It feels like his normal asthma.  He reports he has been using inhaler, that is generic, that he does not think is working.  He does not have a PCP.  He is not on any other medications besides albuterol for his asthma.  He denies any fever.  He reports a cough at night, but typical of his asthma.     HPI  Past Medical History:  Diagnosis Date  . Asthma     There are no active problems to display for this patient.   History reviewed. No pertinent surgical history.      Home Medications    Prior to Admission medications   Medication Sig Start Date End Date Taking? Authorizing Provider  albuterol (PROVENTIL) (2.5 MG/3ML) 0.083% nebulizer solution Take 3 mLs (2.5 mg total) by nebulization every 6 (six) hours as needed for wheezing or shortness of breath. 04/17/19   Aalaya Yadao, Bea Graff, PA-C  albuterol (VENTOLIN HFA) 108 (90 Base) MCG/ACT inhaler Inhale 2 puffs into the lungs every 6 (six) hours as needed for wheezing or shortness of breath. 04/17/19   Renika Shiflet, Bea Graff, PA-C  cetirizine (ZYRTEC) 10 MG tablet Take 1 tablet (10 mg total) by mouth daily. Patient not taking: Reported on 01/26/2019 02/23/18   Jillyn Ledger, PA-C  fluticasone [facility]) 50 MCG/ACT nasal spray Place 1-2 sprays into both nostrils daily. Patient not taking: Reported on 01/26/2019 02/23/18   Maczis, Barth Kirks, PA-C  predniSONE (DELTASONE) 50 MG tablet Take 1 tablet (50 mg total) by mouth daily. 04/17/19   Frederica Kuster, PA-C    Family History Family History  Problem Relation Age of Onset  . Heart failure Father     Social History Social History   Tobacco Use  . Smoking status: Former Smoker     Packs/day: 0.50    Types: Cigarettes    Quit date: 12/26/2012    Years since quitting: 6.3  . Smokeless tobacco: Never Used  Substance Use Topics  . Alcohol use: No  . Drug use: No     Allergies   Patient has no known allergies.   Review of Systems Review of Systems  Constitutional: Negative for chills and fever.  HENT: Negative for facial swelling and sore throat.   Respiratory: Positive for cough, chest tightness, shortness of breath and wheezing.   Cardiovascular: Negative for chest pain.  Gastrointestinal: Negative for abdominal pain, nausea and vomiting.  Genitourinary: Negative for dysuria.  Musculoskeletal: Negative for back pain.  Skin: Negative for rash and wound.  Neurological: Negative for headaches.  Psychiatric/Behavioral: The patient is not nervous/anxious.      Physical Exam Updated Vital Signs BP (!) 142/84 (BP Location: Left Arm)   Pulse 85   Temp 98.5 F (36.9 C) (Oral)   Resp 18   Ht 5\' 7"  (1.702 m)   Wt 90.7 kg   SpO2 95%   BMI 31.32 kg/m   Physical Exam Vitals signs and nursing note reviewed.  Constitutional:      General: He is not in acute distress.    Appearance: He is well-developed. He is not diaphoretic.  HENT:     Head:  Normocephalic and atraumatic.     Mouth/Throat:     Pharynx: No oropharyngeal exudate.  Eyes:     General: No scleral icterus.       Right eye: No discharge.        Left eye: No discharge.     Conjunctiva/sclera: Conjunctivae normal.     Pupils: Pupils are equal, round, and reactive to light.  Neck:     Musculoskeletal: Normal range of motion and neck supple.     Thyroid: No thyromegaly.  Cardiovascular:     Rate and Rhythm: Normal rate and regular rhythm.     Heart sounds: Normal heart sounds. No murmur. No friction rub. No gallop.   Pulmonary:     Effort: Pulmonary effort is normal. No respiratory distress.     Breath sounds: No stridor. Wheezing (Expiratory wheezing bilaterally) present. No rales.   Abdominal:     General: Bowel sounds are normal. There is no distension.     Palpations: Abdomen is soft.     Tenderness: There is no abdominal tenderness. There is no guarding or rebound.  Lymphadenopathy:     Cervical: No cervical adenopathy.  Skin:    General: Skin is warm and dry.     Coloration: Skin is not pale.     Findings: No rash.  Neurological:     Mental Status: He is alert.     Coordination: Coordination normal.      ED Treatments / Results  Labs (all labs ordered are listed, but only abnormal results are displayed) Labs Reviewed - No data to display  EKG None  Radiology No results found.  Procedures Procedures (including critical care time)  Medications Ordered in ED Medications  albuterol (VENTOLIN HFA) 108 (90 Base) MCG/ACT inhaler 4 puff (4 puffs Inhalation Given 04/17/19 1800)  predniSONE (DELTASONE) tablet 60 mg (60 mg Oral Given 04/17/19 1759)     Initial Impression / Assessment and Plan / ED Course  I have reviewed the triage vital signs and the nursing notes.  Pertinent labs & imaging results that were available during my care of the patient were reviewed by me and considered in my medical decision making (see chart for details).        Patient with mild asthma exacerbation.  Symptoms improved with albuterol inhaler in the ED.  Patient saturations in the mid 90s.  Will give short course of prednisone and refill patient's nebulizer solution at home.  I advised patient to follow-up with establish care with a PCP, given resources, to better maintain his asthma.  Return precautions discussed.  Patient understands and agrees with plan.  Patient stable throughout ED course and discharged in satisfactory condition.  Final Clinical Impressions(s) / ED Diagnoses   Final diagnoses:  Mild intermittent asthma, unspecified whether complicated    ED Discharge Orders         Ordered    predniSONE (DELTASONE) 50 MG tablet  Daily     04/17/19 1822     albuterol (PROVENTIL) (2.5 MG/3ML) 0.083% nebulizer solution  Every 6 hours PRN     04/17/19 1822    albuterol (VENTOLIN HFA) 108 (90 Base) MCG/ACT inhaler  Every 6 hours PRN    Note to Pharmacy: Proventil HFA please   04/17/19 Janyth Contes, PA-C 04/18/19 0000    Charlynne Pander, MD 04/18/19 [phone]

## 2019-08-14 ENCOUNTER — Emergency Department (HOSPITAL_COMMUNITY)
Admission: EM | Admit: 2019-08-14 | Discharge: 2019-08-14 | Disposition: A | Discharge status: Home / Self Care | Payer: Self-pay | Attending: Emergency Medicine | Admitting: Emergency Medicine

## 2019-08-14 ENCOUNTER — Other Ambulatory Visit: Payer: Self-pay

## 2019-08-14 ENCOUNTER — Encounter (HOSPITAL_COMMUNITY): Payer: Self-pay

## 2019-08-14 DIAGNOSIS — Z87891 Personal history of nicotine dependence: Secondary | ICD-10-CM | POA: Insufficient documentation

## 2019-08-14 DIAGNOSIS — J45909 Unspecified asthma, uncomplicated: Secondary | ICD-10-CM | POA: Insufficient documentation

## 2019-08-14 MED ORDER — PREDNISONE 20 MG PO TABS: 60.0000 mg | ORAL_TABLET | Freq: Every day | ORAL | 0 refills | Status: AC

## 2019-08-14 MED ORDER — ALBUTEROL SULFATE HFA 108 (90 BASE) MCG/ACT IN AERS: 2.0000 | INHALATION_SPRAY | Freq: Four times a day (QID) | RESPIRATORY_TRACT | 0 refills | Status: DC | PRN

## 2019-08-14 MED ORDER — ALBUTEROL SULFATE (2.5 MG/3ML) 0.083% IN NEBU: 2.5000 mg | INHALATION_SOLUTION | Freq: Four times a day (QID) | RESPIRATORY_TRACT | 0 refills | Status: DC | PRN

## 2019-08-14 MED ORDER — AEROCHAMBER PLUS FLO-VU LARGE MISC
1.0000 | Freq: Once | Status: AC
  Administered 2019-08-14: 1

## 2019-08-14 MED ORDER — PREDNISONE 20 MG PO TABS
60.0000 mg | ORAL_TABLET | Freq: Once | ORAL | Status: AC
  Administered 2019-08-14: 60 mg via ORAL
  Filled 2019-08-14: qty 3

## 2019-08-14 MED ORDER — ALBUTEROL SULFATE HFA 108 (90 BASE) MCG/ACT IN AERS
6.0000 | INHALATION_SPRAY | Freq: Once | RESPIRATORY_TRACT | Status: AC
  Administered 2019-08-14: 6 via RESPIRATORY_TRACT
  Filled 2019-08-14: qty 6.7

## 2019-08-14 NOTE — ED Provider Notes (Signed)
[facility] EMERGENCY DEPARTMENT Provider Note   CSN: [id number] Arrival date & time: 08/14/19  1558     History Chief Complaint  Patient presents with  . asthma/needs inhaler    Jesus Haynes is a 44 y.o. male.  Jesus Haynes is a 44 y.o. male with history of asthma, presenting to the ED for evaluation of shortness of breath and wheezing.  He states that his asthma has been acting up over the past 2 to 3 days, and he ran out of his inhaler yesterday and does not have any more nebulizer solution at home.  He states in the winter he always has frequent flares of his asthma especially with the change in weather.  He states he is has an occasional dry cough, no associated fever, nasal congestion or sore throat.  Reports wheezing and tightness, no chest pain.  States this feels like his usual asthma.  No known sick contacts.  No other aggravating or alleviating factors.        Past Medical History:  Diagnosis Date  . Asthma     There are no problems to display for this patient.   History reviewed. No pertinent surgical history.     Family History  Problem Relation Age of Onset  . Heart failure Father     Social History   Tobacco Use  . Smoking status: Former Smoker    Packs/day: 0.50    Types: Cigarettes    Quit date: 12/26/2012    Years since quitting: 6.6  . Smokeless tobacco: Never Used  Substance Use Topics  . Alcohol use: No  . Drug use: No    Home Medications Prior to Admission medications   Medication Sig Start Date End Date Taking? Authorizing Provider  albuterol (PROVENTIL) (2.5 MG/3ML) 0.083% nebulizer solution Take 3 mLs (2.5 mg total) by nebulization every 6 (six) hours as needed for wheezing or shortness of breath. 08/14/19   Jacqlyn Larsen, PA-C  albuterol (VENTOLIN HFA) 108 (90 Base) MCG/ACT inhaler Inhale 2 puffs into the lungs every 6 (six) hours as needed for wheezing or shortness of breath. 08/14/19   Jacqlyn Larsen, PA-C    cetirizine (ZYRTEC) 10 MG tablet Take 1 tablet (10 mg total) by mouth daily. Patient not taking: Reported on 01/26/2019 02/23/18   Jillyn Ledger, PA-C  fluticasone [facility]) 50 MCG/ACT nasal spray Place 1-2 sprays into both nostrils daily. Patient not taking: Reported on 01/26/2019 02/23/18   Maczis, Barth Kirks, PA-C  predniSONE (DELTASONE) 20 MG tablet Take 3 tablets (60 mg total) by mouth daily for 5 days. 08/14/19 08/19/19  Jacqlyn Larsen, PA-C    Allergies    Patient has no known allergies.  Review of Systems   Review of Systems  Constitutional: Negative for chills and fever.  HENT: Negative.   Respiratory: Positive for cough, shortness of breath and wheezing.   Cardiovascular: Negative for chest pain.  Gastrointestinal: Negative for abdominal pain, nausea and vomiting.  Musculoskeletal: Negative for myalgias.  Neurological: Negative for syncope and light-headedness.  All other systems reviewed and are negative.   Physical Exam Updated Vital Signs BP (!) 133/96 (BP Location: Left Arm)   Pulse 84   Temp 97.8 F (36.6 C) (Oral)   Resp 20   SpO2 97%   Physical Exam Vitals and nursing note reviewed.  Constitutional:      General: He is not in acute distress.    Appearance: Normal appearance. He is well-developed and  normal weight. He is not ill-appearing or diaphoretic.  HENT:     Head: Normocephalic and atraumatic.     Mouth/Throat:     Mouth: Mucous membranes are moist.     Pharynx: Oropharynx is clear.  Eyes:     General:        Right eye: No discharge.        Left eye: No discharge.  Cardiovascular:     Rate and Rhythm: Normal rate and regular rhythm.     Heart sounds: Normal heart sounds. No murmur. No friction rub. No gallop.   Pulmonary:     Effort: Pulmonary effort is normal. No respiratory distress.     Breath sounds: Wheezing present.     Comments: Wheezing throughout bilateral lung fields but patient with no respiratory distress, able to speak in full  sentences, respirations are equal and unlabored. Skin:    General: Skin is warm and dry.  Neurological:     Mental Status: He is alert and oriented to person, place, and time.     Coordination: Coordination normal.  Psychiatric:        Mood and Affect: Mood normal.        Behavior: Behavior normal.     ED Results / Procedures / Treatments   Labs (all labs ordered are listed, but only abnormal results are displayed) Labs Reviewed - No data to display  EKG None  Radiology No results found.  Procedures Procedures (including critical care time)  Medications Ordered in ED Medications  albuterol (VENTOLIN HFA) 108 (90 Base) MCG/ACT inhaler 6 puff (6 puffs Inhalation Given 08/14/19 1656)  AeroChamber Plus Flo-Vu Large MISC 1 each (1 each Other Given 08/14/19 1656)  predniSONE (DELTASONE) tablet 60 mg (60 mg Oral Given 08/14/19 1655)    ED Course  I have reviewed the triage vital signs and the nursing notes.  Pertinent labs & imaging results that were available during my care of the patient were reviewed by me and considered in my medical decision making (see chart for details).    MDM Rules/Calculators/A&P                      44 year old male here with mild asthma exacerbation, ran out of his inhaler yesterday.  He has coarse wheezing throughout bilateral lung fields but good air movement and is in no distress, satting well on room air.  After albuterol treatment and steroids patient is significantly improved.  He has no associated fever or productive cough I have no concern for infection.  No chest pain.  Will discharge home with refills of albuterol inhaler nebulizer solution and a short burst of steroids.  Stressed the importance of PCP follow-up.  Final Clinical Impression(s) / ED Diagnoses Final diagnoses:  Uncomplicated asthma, unspecified asthma severity, unspecified whether persistent    Rx / DC Orders ED Discharge Orders         Ordered    albuterol (PROVENTIL)  (2.5 MG/3ML) 0.083% nebulizer solution  Every 6 hours PRN     08/14/19 1826    albuterol (VENTOLIN HFA) 108 (90 Base) MCG/ACT inhaler  Every 6 hours PRN    Note to Pharmacy: Proventil HFA please   08/14/19 1826    predniSONE (DELTASONE) 20 MG tablet  Daily     08/14/19 1826           Dartha Lodge, Cordelia Poche 08/15/19 2208    Geoffery Lyons, MD 08/16/19 1504

## 2019-08-14 NOTE — ED Triage Notes (Signed)
Patient here requesting inhaler, reports out for 1 day. Speaking complete sentences, NAD

## 2019-08-14 NOTE — ED Notes (Signed)
No reswpiratory diestress

## 2019-08-14 NOTE — ED Notes (Signed)
Has asthma ran out of inhaler no doctorf  usuallyu comes to the eds to get hios meds filled  Needs to get a doctor lessx money for treatment

## 2019-08-14 NOTE — Discharge Instructions (Signed)
Use your albuterol inhaler or nebulizer every 4 hours for the next 24 hours and then as needed.  Take prednisone for the next 5 days.  Follow-up with your primary care doctor for continued management of your asthma.  Return to the emergency department if you have new or worsening symptoms.

## 2019-09-14 ENCOUNTER — Other Ambulatory Visit: Payer: Self-pay

## 2019-09-14 ENCOUNTER — Emergency Department (HOSPITAL_COMMUNITY): Payer: Self-pay

## 2019-09-14 ENCOUNTER — Encounter (HOSPITAL_COMMUNITY): Payer: Self-pay

## 2019-09-14 ENCOUNTER — Emergency Department (HOSPITAL_COMMUNITY)
Admission: EM | Admit: 2019-09-14 | Discharge: 2019-09-14 | Disposition: A | Discharge status: Home / Self Care | Payer: Self-pay | Attending: Emergency Medicine | Admitting: Emergency Medicine

## 2019-09-14 DIAGNOSIS — J4521 Mild intermittent asthma with (acute) exacerbation: Secondary | ICD-10-CM | POA: Insufficient documentation

## 2019-09-14 DIAGNOSIS — Z760 Encounter for issue of repeat prescription: Secondary | ICD-10-CM | POA: Insufficient documentation

## 2019-09-14 DIAGNOSIS — Z87891 Personal history of nicotine dependence: Secondary | ICD-10-CM | POA: Insufficient documentation

## 2019-09-14 MED ORDER — ALBUTEROL SULFATE HFA 108 (90 BASE) MCG/ACT IN AERS: 2.0000 | INHALATION_SPRAY | Freq: Four times a day (QID) | RESPIRATORY_TRACT | 2 refills | Status: DC | PRN

## 2019-09-14 MED ORDER — ALBUTEROL SULFATE (2.5 MG/3ML) 0.083% IN NEBU: 2.5000 mg | INHALATION_SOLUTION | Freq: Four times a day (QID) | RESPIRATORY_TRACT | 2 refills | Status: DC | PRN

## 2019-09-14 MED ORDER — PREDNISONE 50 MG PO TABS: 50.0000 mg | ORAL_TABLET | Freq: Every day | ORAL | 0 refills | Status: DC

## 2019-09-14 MED ORDER — ALBUTEROL SULFATE HFA 108 (90 BASE) MCG/ACT IN AERS
6.0000 | INHALATION_SPRAY | Freq: Once | RESPIRATORY_TRACT | Status: AC
  Administered 2019-09-14: 6 via RESPIRATORY_TRACT
  Filled 2019-09-14: qty 6.7

## 2019-09-14 MED ORDER — PREDNISONE 20 MG PO TABS
60.0000 mg | ORAL_TABLET | Freq: Once | ORAL | Status: AC
  Administered 2019-09-14: 60 mg via ORAL
  Filled 2019-09-14: qty 3

## 2019-09-14 NOTE — ED Provider Notes (Signed)
[facility]-EMERGENCY DEPT Provider Note   CSN: [id number] Arrival date & time: 09/14/19  1016    History Chief Complaint  Patient presents with  . Asthma  . Medication Refill   Jesus Haynes is a 44 y.o. male with past medical history significant for asthma who presents for evaluation of wheezing. Uses albuterol inhaler daily. Has been out x 2 days. Feels sx similar to prior asthma exacerbation. Has had some mild congestion rhinorrhea consistent with seasonal allergies.  Is not followed by a PCP or pulmonology.  States some wheeziness however denies chest pain, shortness of breath.  No known sick contacts.  Denies additional or alleviating factors.  No fever, chills, nausea, vomiting, cough, hemoptysis, lateral leg swelling, redness or warmth.   History obtained from patient and past medical records.  No interpreter is used.  HPI     Past Medical History:  Diagnosis Date  . Asthma     There are no problems to display for this patient.   History reviewed. No pertinent surgical history.     Family History  Problem Relation Age of Onset  . Heart failure Father     Social History   Tobacco Use  . Smoking status: Former Smoker    Packs/day: 0.50    Types: Cigarettes    Quit date: 12/26/2012    Years since quitting: 6.7  . Smokeless tobacco: Never Used  Substance Use Topics  . Alcohol use: No  . Drug use: No    Home Medications Prior to Admission medications   Medication Sig Start Date End Date Taking? Authorizing Provider  albuterol (PROVENTIL) (2.5 MG/3ML) 0.083% nebulizer solution Take 3 mLs (2.5 mg total) by nebulization every 6 (six) hours as needed for wheezing or shortness of breath. 09/14/19   Kathlen Sakurai A, PA-C  albuterol (VENTOLIN HFA) 108 (90 Base) MCG/ACT inhaler Inhale 2 puffs into the lungs every 6 (six) hours as needed for wheezing or shortness of breath. 09/14/19   Eben Choinski A, PA-C  cetirizine (ZYRTEC) 10 MG tablet  Take 1 tablet (10 mg total) by mouth daily. Patient not taking: Reported on 01/26/2019 02/23/18   Jacinto Halim, PA-C  fluticasone [facility]) 50 MCG/ACT nasal spray Place 1-2 sprays into both nostrils daily. Patient not taking: Reported on 01/26/2019 02/23/18   Maczis, Elmer Sow, PA-C  predniSONE (DELTASONE) 50 MG tablet Take 1 tablet (50 mg total) by mouth daily. 09/14/19   Jeb Schloemer A, PA-C    Allergies    Patient has no known allergies.  Review of Systems   Review of Systems  Constitutional: Negative.   HENT: Positive for congestion and rhinorrhea.   Respiratory: Positive for wheezing. Negative for apnea, cough, choking, chest tightness, shortness of breath and stridor.   Cardiovascular: Negative.   Gastrointestinal: Negative.   Genitourinary: Negative.   Musculoskeletal: Negative.   Skin: Negative.   Neurological: Negative.   All other systems reviewed and are negative.   Physical Exam Updated Vital Signs BP 130/88   Pulse 95   Temp 98 F (36.7 C) (Oral)   Resp 16   Ht 5\' 7"  (1.702 m)   Wt 90.7 kg   SpO2 96%   BMI 31.32 kg/m   Physical Exam Vitals and nursing note reviewed.  Constitutional:      General: He is not in acute distress.    Appearance: He is well-developed. He is not ill-appearing, toxic-appearing or diaphoretic.  HENT:     Head: Normocephalic and atraumatic.  Nose: Nose normal.     Mouth/Throat:     Mouth: Mucous membranes are moist.     Pharynx: Oropharynx is clear.  Eyes:     Pupils: Pupils are equal, round, and reactive to light.  Cardiovascular:     Rate and Rhythm: Normal rate and regular rhythm.     Pulses: Normal pulses.     Heart sounds: Normal heart sounds.  Pulmonary:     Effort: No tachypnea, bradypnea or accessory muscle usage.     Breath sounds: No stridor or decreased air movement. Wheezing present. No decreased breath sounds.     Comments: Diffuse expiratory wheezing throughout however with normal movement of air.   Speaks in full sentences without difficulty.  No accessory muscle usage Abdominal:     General: There is no distension.     Palpations: Abdomen is soft.     Tenderness: There is no abdominal tenderness.     Comments: Soft non tender without rebound or guarding.  Musculoskeletal:        General: Normal range of motion.     Cervical back: Normal range of motion and neck supple.     Comments: Compartments soft. Homans sign negative. Moves all 4 extremities without difficulty.  Skin:    General: Skin is warm and dry.     Capillary Refill: Capillary refill takes less than 2 seconds.     Comments: Brisk cap refill.  Neurological:     Mental Status: He is alert.    ED Results / Procedures / Treatments   Labs (all labs ordered are listed, but only abnormal results are displayed) Labs Reviewed - No data to display  EKG None  Radiology DG Chest Portable 1 View  Result Date: 09/14/2019 CLINICAL DATA:  Shortness of breath EXAM: PORTABLE CHEST 1 VIEW COMPARISON:  August 24, 2018 FINDINGS: The lungs are clear. The heart size and pulmonary vascularity are normal. No adenopathy. No bone lesions. IMPRESSION: Lungs clear.  Cardiac silhouette within normal limits. Electronically Signed   By: Bretta Bang III M.D.   On: 09/14/2019 11:42    Procedures Procedures (including critical care time)  Medications Ordered in ED Medications  predniSONE (DELTASONE) tablet 60 mg (60 mg Oral Given 09/14/19 1114)  albuterol (VENTOLIN HFA) 108 (90 Base) MCG/ACT inhaler 6 puff (6 puffs Inhalation Given 09/14/19 1114)    ED Course  I have reviewed the triage vital signs and the nursing notes.  Pertinent labs & imaging results that were available during my care of the patient were reviewed by me and considered in my medical decision making (see chart for details).  44 year old male presents for evaluation of wheeze.  Known asthmatic.  Has had some mild congestion rhinorrhea.  He has diffuse expiratory  wheeze throughout however he does not appear in respiratory distress.  Speaks in full sentences without difficulty.  No tachycardia, tachypnea or hypoxia.  Ran out of his inhaler 2 days ago.  Ambulatory with out any hypoxia.   Patient reassessed. Improvement in lung sounds after steroids and albuterol. Will dc home with steroid burst and refill home meds. Encouraged establishing care with PCP for med refill to ensure he doesn't run out of home meds.  The patient has been appropriately medically screened and/or stabilized in the ED. I have low suspicion for any other emergent medical condition which would require further screening, evaluation or treatment in the ED or require inpatient management.  Patient is hemodynamically stable and in no acute distress.  Patient  able to ambulate in department prior to ED.  Evaluation does not show acute pathology that would require ongoing or additional emergent interventions while in the emergency department or further inpatient treatment.  I have discussed the diagnosis with the patient and answered all questions.  Pain is been managed while in the emergency department and patient has no further complaints prior to discharge.  Patient is comfortable with plan discussed in room and is stable for discharge at this time.  I have discussed strict return precautions for returning to the emergency department.  Patient was encouraged to follow-up with PCP/specialist refer to at discharge.      MDM Rules/Calculators/A&P                      ARIC JOST was evaluated in Emergency Department on 09/14/2019 for the  symptoms described in the history of present illness. He was evaluated in the context of the global COVID-19 pandemic, which necessitated consideration that the patient might be at risk for infection with the SARS-CoV-2 virus that causes COVID-19. Institutional protocols and algorithms that pertain to the evaluation of patients at risk for COVID-19 are in a state  of rapid change based on information released by regulatory bodies including the CDC and federal and state organizations. These policies and algorithms were followed during the patient's care in the ED. Final Clinical Impression(s) / ED Diagnoses Final diagnoses:  Mild intermittent asthma with exacerbation  Medication refill    Rx / DC Orders ED Discharge Orders         Ordered    albuterol (PROVENTIL) (2.5 MG/3ML) 0.083% nebulizer solution  Every 6 hours PRN     09/14/19 1252    albuterol (VENTOLIN HFA) 108 (90 Base) MCG/ACT inhaler  Every 6 hours PRN    Note to Pharmacy: Proventil HFA please   09/14/19 1252    predniSONE (DELTASONE) 50 MG tablet  Daily     09/14/19 1252           Kalem Rockwell A, PA-C 09/14/19 1504    Pattricia Boss, MD 09/15/19 [phone]

## 2019-09-14 NOTE — Discharge Instructions (Signed)
Take the meds as prescribed  Return for new or worsening symptoms 

## 2019-09-14 NOTE — ED Notes (Signed)
Pt verbalizes understanding of DC instructions. Pt belongings returned and is ambulatory out of ED.  

## 2019-09-14 NOTE — ED Triage Notes (Signed)
Patient states he began wheezing/asthma yesterday. Patient states he had an albuterol inhaler and is now empty.

## 2019-11-26 ENCOUNTER — Emergency Department (HOSPITAL_COMMUNITY)
Admission: EM | Admit: 2019-11-26 | Discharge: 2019-11-26 | Disposition: A | Discharge status: Home / Self Care | Payer: Self-pay | Attending: Emergency Medicine | Admitting: Emergency Medicine

## 2019-11-26 ENCOUNTER — Other Ambulatory Visit: Payer: Self-pay

## 2019-11-26 ENCOUNTER — Encounter (HOSPITAL_COMMUNITY): Payer: Self-pay | Admitting: Emergency Medicine

## 2019-11-26 DIAGNOSIS — Z79899 Other long term (current) drug therapy: Secondary | ICD-10-CM | POA: Insufficient documentation

## 2019-11-26 DIAGNOSIS — R062 Wheezing: Secondary | ICD-10-CM | POA: Insufficient documentation

## 2019-11-26 DIAGNOSIS — J4541 Moderate persistent asthma with (acute) exacerbation: Secondary | ICD-10-CM

## 2019-11-26 DIAGNOSIS — J45909 Unspecified asthma, uncomplicated: Secondary | ICD-10-CM | POA: Insufficient documentation

## 2019-11-26 MED ORDER — ALBUTEROL SULFATE (2.5 MG/3ML) 0.083% IN NEBU: 2.5000 mg | INHALATION_SOLUTION | RESPIRATORY_TRACT | 1 refills | Status: DC | PRN

## 2019-11-26 MED ORDER — ALBUTEROL SULFATE HFA 108 (90 BASE) MCG/ACT IN AERS
4.0000 | INHALATION_SPRAY | RESPIRATORY_TRACT | Status: DC | PRN
  Administered 2019-11-26: 4 via RESPIRATORY_TRACT
  Filled 2019-11-26: qty 6.7

## 2019-11-26 MED ORDER — ALBUTEROL SULFATE (2.5 MG/3ML) 0.083% IN NEBU
5.0000 mg | INHALATION_SOLUTION | Freq: Once | RESPIRATORY_TRACT | Status: DC
  Filled 2019-11-26: qty 6

## 2019-11-26 MED ORDER — PREDNISONE 10 MG PO TABS: 20.0000 mg | ORAL_TABLET | Freq: Two times a day (BID) | ORAL | 0 refills | Status: DC

## 2019-11-26 NOTE — ED Notes (Signed)
RT notified

## 2019-11-26 NOTE — Discharge Instructions (Addendum)
Begin taking prednisone as prescribed.  Use your albuterol nebulizer every 4 hours as needed.  Use your inhaler, 2 puffs every 4 hours as needed.  Return to the ER if symptoms significantly worsen or change.

## 2019-11-26 NOTE — ED Provider Notes (Signed)
[facility] DEPT Provider Note   CSN: [id number] Arrival date & time: 11/26/19  3710     History Chief Complaint  Patient presents with  . Asthma  . Medication Refill    Jesus Haynes is a 44 y.o. male.  Patient is a 44 year old male with history of asthma.  He presents today for evaluation of wheezing and shortness of breath.  This is worsened over the past several days.  He denies any fevers, chills, or chest pain.  He denies any productive cough.  This feels consistent with prior asthma exacerbations and he typically experiences these this time of year with allergen/pollen.  He is out of his inhaler and out of his albuterol solution for his nebulizer.  The history is provided by the patient.       Past Medical History:  Diagnosis Date  . Asthma     There are no problems to display for this patient.   History reviewed. No pertinent surgical history.     Family History  Problem Relation Age of Onset  . Heart failure Father     Social History   Tobacco Use  . Smoking status: Former Smoker    Packs/day: 0.50    Types: Cigarettes    Quit date: 12/26/2012    Years since quitting: 6.9  . Smokeless tobacco: Never Used  Substance Use Topics  . Alcohol use: No  . Drug use: No    Home Medications Prior to Admission medications   Medication Sig Start Date End Date Taking? Authorizing Provider  albuterol (PROVENTIL) (2.5 MG/3ML) 0.083% nebulizer solution Take 3 mLs (2.5 mg total) by nebulization every 6 (six) hours as needed for wheezing or shortness of breath. 09/14/19   Henderly, Britni A, PA-C  albuterol (VENTOLIN HFA) 108 (90 Base) MCG/ACT inhaler Inhale 2 puffs into the lungs every 6 (six) hours as needed for wheezing or shortness of breath. 09/14/19   Henderly, Britni A, PA-C  cetirizine (ZYRTEC) 10 MG tablet Take 1 tablet (10 mg total) by mouth daily. Patient not taking: Reported on 01/26/2019 02/23/18   Jillyn Ledger, PA-C    fluticasone [facility]) 50 MCG/ACT nasal spray Place 1-2 sprays into both nostrils daily. Patient not taking: Reported on 01/26/2019 02/23/18   Maczis, Barth Kirks, PA-C  predniSONE (DELTASONE) 50 MG tablet Take 1 tablet (50 mg total) by mouth daily. 09/14/19   Henderly, Britni A, PA-C    Allergies    Patient has no known allergies.  Review of Systems   Review of Systems  All other systems reviewed and are negative.   Physical Exam Updated Vital Signs BP (!) 148/102 (BP Location: Left Arm)   Pulse 80   Temp 98.1 F (36.7 C) (Oral)   Resp 20   SpO2 97%   Physical Exam Vitals and nursing note reviewed.  Constitutional:      General: He is not in acute distress.    Appearance: He is well-developed. He is not diaphoretic.  HENT:     Head: Normocephalic and atraumatic.  Cardiovascular:     Rate and Rhythm: Normal rate and regular rhythm.     Heart sounds: No murmur. No friction rub.  Pulmonary:     Effort: Pulmonary effort is normal. No respiratory distress.     Breath sounds: Wheezing present. No rales.  Abdominal:     General: Bowel sounds are normal. There is no distension.     Palpations: Abdomen is soft.     Tenderness:  There is no abdominal tenderness.  Musculoskeletal:        General: Normal range of motion.     Cervical back: Normal range of motion and neck supple.  Skin:    General: Skin is warm and dry.  Neurological:     Mental Status: He is alert and oriented to person, place, and time.     Coordination: Coordination normal.     ED Results / Procedures / Treatments   Labs (all labs ordered are listed, but only abnormal results are displayed) Labs Reviewed - No data to display  EKG None  Radiology No results found.  Procedures Procedures (including critical care time)  Medications Ordered in ED Medications  albuterol (PROVENTIL) (2.5 MG/3ML) 0.083% nebulizer solution 5 mg (has no administration in time range)  albuterol (VENTOLIN HFA) 108 (90 Base)  MCG/ACT inhaler 4 puff (has no administration in time range)    ED Course  I have reviewed the triage vital signs and the nursing notes.  Pertinent labs & imaging results that were available during my care of the patient were reviewed by me and considered in my medical decision making (see chart for details).    MDM Rules/Calculators/A&P  Patient with shortness of breath, wheezing.  He has history of asthma and this seems like an exacerbation.  Will give MDI here, discharge with prednisone, albuterol solution for his nebulizer.  To return prn.  Final Clinical Impression(s) / ED Diagnoses Final diagnoses:  None    Rx / DC Orders ED Discharge Orders    None       Geoffery Lyons, MD 11/26/19 1031

## 2019-11-26 NOTE — ED Triage Notes (Signed)
Per pt, states history of asthma, out of his inhaler-used a neb treatment last night-symptoms worsened last night

## 2019-12-29 ENCOUNTER — Emergency Department (HOSPITAL_COMMUNITY)
Admission: EM | Admit: 2019-12-29 | Discharge: 2019-12-29 | Disposition: A | Discharge status: Home / Self Care | Payer: Self-pay | Attending: Emergency Medicine | Admitting: Emergency Medicine

## 2019-12-29 ENCOUNTER — Other Ambulatory Visit: Payer: Self-pay

## 2019-12-29 ENCOUNTER — Encounter (HOSPITAL_COMMUNITY): Payer: Self-pay

## 2019-12-29 DIAGNOSIS — Z87891 Personal history of nicotine dependence: Secondary | ICD-10-CM | POA: Insufficient documentation

## 2019-12-29 DIAGNOSIS — J4541 Moderate persistent asthma with (acute) exacerbation: Secondary | ICD-10-CM | POA: Insufficient documentation

## 2019-12-29 DIAGNOSIS — Z760 Encounter for issue of repeat prescription: Secondary | ICD-10-CM | POA: Insufficient documentation

## 2019-12-29 MED ORDER — PREDNISONE 20 MG PO TABS: 20.0000 mg | ORAL_TABLET | Freq: Every day | ORAL | 0 refills | Status: AC

## 2019-12-29 MED ORDER — ALBUTEROL SULFATE HFA 108 (90 BASE) MCG/ACT IN AERS: 1.0000 | INHALATION_SPRAY | Freq: Four times a day (QID) | RESPIRATORY_TRACT | 4 refills | Status: DC | PRN

## 2019-12-29 MED ORDER — PREDNISONE 20 MG PO TABS
60.0000 mg | ORAL_TABLET | Freq: Once | ORAL | Status: AC
  Administered 2019-12-29: 60 mg via ORAL
  Filled 2019-12-29: qty 3

## 2019-12-29 MED ORDER — ALBUTEROL SULFATE HFA 108 (90 BASE) MCG/ACT IN AERS
1.0000 | INHALATION_SPRAY | RESPIRATORY_TRACT | Status: DC | PRN
  Administered 2019-12-29 (×2): 2 via RESPIRATORY_TRACT
  Filled 2019-12-29 (×2): qty 6.7

## 2019-12-29 MED ORDER — ALBUTEROL SULFATE (2.5 MG/3ML) 0.083% IN NEBU: 2.5000 mg | INHALATION_SOLUTION | RESPIRATORY_TRACT | 1 refills | Status: DC | PRN

## 2019-12-29 MED ORDER — AEROCHAMBER PLUS FLO-VU LARGE MISC
1.0000 | Freq: Once | Status: AC
  Administered 2019-12-29: 1

## 2019-12-29 NOTE — ED Provider Notes (Signed)
[facility] EMERGENCY DEPARTMENT Provider Note   CSN: [id number] Arrival date & time: 12/29/19  1222     History Chief Complaint  Patient presents with  . Asthma  . Medication Refill    Jesus Haynes is a 44 y.o. male with past medical history significant for asthma.  HPI Patient presents to the emergency department today with chief complaint of asthma exacerbation x1 day.  Patient states that his albuterol inhaler is almost empty.  He states the weather typically causes his asthma exacerbations however thinks this current one was caused by his seasonal allergies.  He denies any fever, chills, nasal congestion, cough, chest pain, shortness of breath.  He states this feels like his typical asthma.  He denies any sick contacts or known Covid exposures.  Patient is a non-smoker. He does not have a pcp for follow up.  Last prednisone use was x1 month ago.  Patient denies history of admission or intubation for his asthma.    Past Medical History:  Diagnosis Date  . Asthma     There are no problems to display for this patient.   History reviewed. No pertinent surgical history.     Family History  Problem Relation Age of Onset  . Heart failure Father     Social History   Tobacco Use  . Smoking status: Former Smoker    Packs/day: 0.50    Types: Cigarettes    Quit date: 12/26/2012    Years since quitting: 7.0  . Smokeless tobacco: Never Used  Vaping Use  . Vaping Use: Former  . Quit date: 08/21/2018  . Substances: Nicotine  Substance Use Topics  . Alcohol use: No  . Drug use: No    Home Medications Prior to Admission medications   Medication Sig Start Date End Date Taking? Authorizing Provider  albuterol (PROVENTIL) (2.5 MG/3ML) 0.083% nebulizer solution Take 3 mLs (2.5 mg total) by nebulization every 4 (four) hours as needed for wheezing or shortness of breath. 12/29/19   Yeng Perz, Yvonna Alanis E, PA-C  albuterol (VENTOLIN HFA) 108 (90 Base) MCG/ACT  inhaler Inhale 1-2 puffs into the lungs every 6 (six) hours as needed for wheezing or shortness of breath. 12/29/19   Deztinee Lohmeyer E, PA-C  cetirizine (ZYRTEC) 10 MG tablet Take 1 tablet (10 mg total) by mouth daily. Patient not taking: Reported on 01/26/2019 02/23/18   Jacinto Halim, PA-C  fluticasone [facility]) 50 MCG/ACT nasal spray Place 1-2 sprays into both nostrils daily. Patient not taking: Reported on 01/26/2019 02/23/18   Maczis, Elmer Sow, PA-C  predniSONE (DELTASONE) 20 MG tablet Take 1 tablet (20 mg total) by mouth daily for 4 days. 12/30/19 01/03/20  Zakyia Gagan, Caroleen Hamman, PA-C    Allergies    Patient has no known allergies.  Review of Systems   Review of Systems All other systems are reviewed and are negative for acute change except as noted in the HPI.  Physical Exam Updated Vital Signs BP 135/89 (BP Location: Right Arm)   Pulse 92   Temp 98.3 F (36.8 C) (Oral)   Resp 16   Ht 5\' 6"  (1.676 m)   Wt 97.5 kg   SpO2 96%   BMI 34.70 kg/m   Physical Exam Vitals and nursing note reviewed.  Constitutional:      Appearance: He is well-developed. He is not ill-appearing or toxic-appearing.  HENT:     Head: Normocephalic and atraumatic.     Nose: Nose normal.  Eyes:  General: No scleral icterus.       Right eye: No discharge.        Left eye: No discharge.     Conjunctiva/sclera: Conjunctivae normal.  Neck:     Vascular: No JVD.  Cardiovascular:     Rate and Rhythm: Normal rate and regular rhythm.     Pulses: Normal pulses.     Heart sounds: Normal heart sounds.  Pulmonary:     Effort: Pulmonary effort is normal.     Breath sounds: Normal breath sounds.     Comments: Oxygen saturation is 96% on room air during exam.  He has mild expiratory wheezing heard in posterior lungs.  No rales or rhonchi.  He is speaking in full sentences.  No respiratory distress.  No accessory muscle usage. Abdominal:     General: There is no distension.  Musculoskeletal:         General: Normal range of motion.     Cervical back: Normal range of motion.  Skin:    General: Skin is warm and dry.  Neurological:     Mental Status: He is oriented to person, place, and time.     GCS: GCS eye subscore is 4. GCS verbal subscore is 5. GCS motor subscore is 6.     Comments: Fluent speech, no facial droop.  Psychiatric:        Behavior: Behavior normal.     ED Results / Procedures / Treatments   Labs (all labs ordered are listed, but only abnormal results are displayed) Labs Reviewed - No data to display  EKG None  Radiology No results found.  Procedures Procedures (including critical care time)  Medications Ordered in ED Medications  albuterol (VENTOLIN HFA) 108 (90 Base) MCG/ACT inhaler 1-2 puff (2 puffs Inhalation Given 12/29/19 1338)  predniSONE (DELTASONE) tablet 60 mg (60 mg Oral Given 12/29/19 1338)  AeroChamber Plus Flo-Vu Large MISC 1 each (1 each Other Given 12/29/19 1344)    ED Course  I have reviewed the triage vital signs and the nursing notes.  Pertinent labs & imaging results that were available during my care of the patient were reviewed by me and considered in my medical decision making (see chart for details).    MDM Rules/Calculators/A&P                          History provided by patient with additional history obtained from chart review.    44 year old male presenting with asthma exacerbation and requesting medication refill. Known asthmatic. Patient ambulated in ED with O2 saturations maintained >90, no current signs of respiratory distress.  Patient used 2 puffs of albuterol inhaler in triage.  On my exam he still has faint expiratory wheezing.  Patient use an additional 4 puffs of albuterol and lung exam improved after reassessment. Prednisone given in the ED and pt will be discharged with prednisone burst. Engaged in shared decision making with patient regarding a chest xray. He has no fever or cough and does not wish to have imaging  today. I feel this is reasonable given his reassuring exam. Pt states he is breathing at baseline. Pt has been instructed to continue using prescribed medications and strongly encourage patient to follow-up with [facility] as he does not have a PCP.   Portions of this note were generated with Scientist, clinical (histocompatibility and immunogenetics). Dictation errors may occur despite best attempts at proofreading    Final Clinical Impression(s) /  ED Diagnoses Final diagnoses:  Medication refill  Moderate persistent asthma with exacerbation    Rx / DC Orders ED Discharge Orders         Ordered    predniSONE (DELTASONE) 20 MG tablet  Daily     Discontinue  Reprint     12/29/19 1330    albuterol (VENTOLIN HFA) 108 (90 Base) MCG/ACT inhaler  Every 6 hours PRN     Discontinue  Reprint     12/29/19 1330    albuterol (PROVENTIL) (2.5 MG/3ML) 0.083% nebulizer solution  Every 4 hours PRN     Discontinue  Reprint     12/29/19 1330           Kenley Rettinger, Caroleen Hamman, PA-C 12/29/19 1503    Pricilla Loveless, MD 12/30/19 1337

## 2019-12-29 NOTE — ED Triage Notes (Signed)
Pt presents with asthma exacerbation starting today. Uses an albuterol inhaler but has ran out. RR even and unlabored in triage

## 2019-12-29 NOTE — ED Notes (Signed)
Patient verbalizes understanding of discharge instructions . Opportunity for questions and answers were provided . Armband removed by staff ,Pt discharged from ED. W/C  offered at D/C  and Declined W/C at D/C and was escorted to lobby by RN.  

## 2019-12-29 NOTE — Discharge Instructions (Addendum)
You have been seen today for asthma exacerbation. Please read and follow all provided instructions. Return to the emergency room for worsening condition or new concerning symptoms.    1. Medications:  Prescription sent to your pharmacy for prednisone. Start taking it tomorrow -Use albuterol inhaler every 4 hours for wheezing.  Continue usual home medications Take medications as prescribed. Please review all of the medicines and only take them if you do not have an allergy to them.   2. Treatment: rest, drink plenty of fluids  3. Follow Up:  Please follow up with primary care provider by scheduling an appointment as soon as possible for a visit  -Call the [facility] to schedule follow up as we discussed.   It is also a possibility that you have an allergic reaction to any of the medicines that you have been prescribed - Everybody reacts differently to medications and while MOST people have no trouble with most medicines, you may have a reaction such as nausea, vomiting, rash, swelling, shortness of breath. If this is the case, please stop taking the medicine immediately and contact your physician.  ?

## 2020-05-09 ENCOUNTER — Emergency Department (HOSPITAL_COMMUNITY)
Admission: EM | Admit: 2020-05-09 | Discharge: 2020-05-09 | Disposition: A | Discharge status: Home / Self Care | Payer: Self-pay | Attending: Emergency Medicine | Admitting: Emergency Medicine

## 2020-05-09 ENCOUNTER — Emergency Department (HOSPITAL_COMMUNITY): Payer: Self-pay

## 2020-05-09 DIAGNOSIS — J45909 Unspecified asthma, uncomplicated: Secondary | ICD-10-CM | POA: Insufficient documentation

## 2020-05-09 DIAGNOSIS — Z5321 Procedure and treatment not carried out due to patient leaving prior to being seen by health care provider: Secondary | ICD-10-CM | POA: Insufficient documentation

## 2020-05-09 LAB — COMPREHENSIVE METABOLIC PANEL
ALT: 30 U/L (ref 0–44)
AST: 26 U/L (ref 15–41)
Albumin: 3.9 g/dL (ref 3.5–5.0)
Alkaline Phosphatase: 59 U/L (ref 38–126)
Anion gap: 10 (ref 5–15)
BUN: 12 mg/dL (ref 6–20)
CO2: 23 mmol/L (ref 22–32)
Calcium: 9.2 mg/dL (ref 8.9–10.3)
Chloride: 105 mmol/L (ref 98–111)
Creatinine, Ser: 1.11 mg/dL (ref 0.61–1.24)
GFR, Estimated: >60 mL/min (ref >60)
Glucose, Bld: 223 mg/dL — ABNORMAL HIGH (ref 70–99)
Potassium: 4 mmol/L (ref 3.5–5.1)
Sodium: 138 mmol/L (ref 135–145)
Total Bilirubin: 0.9 mg/dL (ref 0.3–1.2)
Total Protein: 6.7 g/dL (ref 6.5–8.1)

## 2020-05-09 LAB — CBC
HCT: 45.4 % (ref 39.0–52.0)
Hemoglobin: 14.5 g/dL (ref 13.0–17.0)
MCH: 28.3 pg (ref 26.0–34.0)
MCHC: 31.9 g/dL (ref 30.0–36.0)
MCV: 88.5 fL (ref 80.0–100.0)
Platelets: 240 10*3/uL (ref 150–400)
RBC: 5.13 MIL/uL (ref 4.22–5.81)
RDW: 13.4 % (ref 11.5–15.5)
WBC: 6.7 10*3/uL (ref 4.0–10.5)
nRBC: 0 % (ref 0.0–0.2)

## 2020-05-09 MED ORDER — ALBUTEROL SULFATE HFA 108 (90 BASE) MCG/ACT IN AERS
2.0000 | INHALATION_SPRAY | Freq: Once | RESPIRATORY_TRACT | Status: AC
  Administered 2020-05-09: 2 via RESPIRATORY_TRACT
  Filled 2020-05-09: qty 6.7

## 2020-05-09 NOTE — ED Triage Notes (Signed)
Pt here from home with c/o asthma flair up , pt is out of his inhaler , states that this usually happens with change of seasons

## 2020-05-24 ENCOUNTER — Encounter (HOSPITAL_COMMUNITY): Payer: Self-pay

## 2020-05-24 ENCOUNTER — Other Ambulatory Visit: Payer: Self-pay

## 2020-05-24 ENCOUNTER — Emergency Department (HOSPITAL_COMMUNITY)
Admission: EM | Admit: 2020-05-24 | Discharge: 2020-05-24 | Disposition: A | Discharge status: Home / Self Care | Payer: Self-pay | Attending: Emergency Medicine | Admitting: Emergency Medicine

## 2020-05-24 DIAGNOSIS — J4521 Mild intermittent asthma with (acute) exacerbation: Secondary | ICD-10-CM

## 2020-05-24 DIAGNOSIS — J45901 Unspecified asthma with (acute) exacerbation: Secondary | ICD-10-CM | POA: Insufficient documentation

## 2020-05-24 DIAGNOSIS — Z87891 Personal history of nicotine dependence: Secondary | ICD-10-CM | POA: Insufficient documentation

## 2020-05-24 MED ORDER — PREDNISONE 20 MG PO TABS: 20.0000 mg | ORAL_TABLET | Freq: Every day | ORAL | 0 refills | Status: AC

## 2020-05-24 MED ORDER — ALBUTEROL SULFATE (2.5 MG/3ML) 0.083% IN NEBU: 2.5000 mg | INHALATION_SOLUTION | RESPIRATORY_TRACT | 1 refills | Status: DC | PRN

## 2020-05-24 MED ORDER — ALBUTEROL SULFATE HFA 108 (90 BASE) MCG/ACT IN AERS
8.0000 | INHALATION_SPRAY | Freq: Once | RESPIRATORY_TRACT | Status: AC
  Administered 2020-05-24: 8 via RESPIRATORY_TRACT
  Filled 2020-05-24: qty 6.7

## 2020-05-24 MED ORDER — ALBUTEROL SULFATE HFA 108 (90 BASE) MCG/ACT IN AERS: 1.0000 | INHALATION_SPRAY | Freq: Four times a day (QID) | RESPIRATORY_TRACT | 1 refills | Status: DC | PRN

## 2020-05-24 MED ORDER — AEROCHAMBER Z-STAT PLUS/MEDIUM MISC
1.0000 | Freq: Once | Status: AC
  Administered 2020-05-24: 1
  Filled 2020-05-24: qty 1

## 2020-05-24 MED ORDER — PREDNISONE 20 MG PO TABS
60.0000 mg | ORAL_TABLET | Freq: Once | ORAL | Status: AC
  Administered 2020-05-24: 60 mg via ORAL
  Filled 2020-05-24: qty 3

## 2020-05-24 MED ORDER — IPRATROPIUM BROMIDE HFA 17 MCG/ACT IN AERS
4.0000 | INHALATION_SPRAY | Freq: Once | RESPIRATORY_TRACT | Status: AC
  Administered 2020-05-24: 4 via RESPIRATORY_TRACT
  Filled 2020-05-24: qty 12.9

## 2020-05-24 NOTE — ED Provider Notes (Signed)
[facility]-EMERGENCY DEPT Provider Note   CSN: [id number] Arrival date & time: 05/24/20  1256     History Chief Complaint  Patient presents with  . Wheezing    Jesus Haynes is a 44 y.o. male w PMHx asthma, presenting to the ED with complaint of asthma exacerbation that began about 1 week ago. Has treated symptoms with albuterol without enough relief. He believes the weather change is causing exacerbation. Symptoms feel similar to previous exacerbations. No illness, no fever.   The history is provided by the patient.       Past Medical History:  Diagnosis Date  . Asthma     There are no problems to display for this patient.   History reviewed. No pertinent surgical history.     Family History  Problem Relation Age of Onset  . Heart failure Father     Social History   Tobacco Use  . Smoking status: Former Smoker    Packs/day: 0.50    Types: Cigarettes    Quit date: 12/26/2012    Years since quitting: 7.4  . Smokeless tobacco: Never Used  Vaping Use  . Vaping Use: Former  . Quit date: 08/21/2018  . Substances: Nicotine  Substance Use Topics  . Alcohol use: No  . Drug use: No    Home Medications Prior to Admission medications   Medication Sig Start Date End Date Taking? Authorizing Provider  albuterol (PROVENTIL) (2.5 MG/3ML) 0.083% nebulizer solution Take 3 mLs (2.5 mg total) by nebulization every 4 (four) hours as needed for wheezing or shortness of breath. 05/24/20   Dewayne Jurek, [place] N, PA-C  albuterol (VENTOLIN HFA) 108 (90 Base) MCG/ACT inhaler Inhale 1-2 puffs into the lungs every 6 (six) hours as needed for wheezing or shortness of breath. 05/24/20   Era Parr, [place] N, PA-C  cetirizine (ZYRTEC) 10 MG tablet Take 1 tablet (10 mg total) by mouth daily. Patient not taking: Reported on 01/26/2019 02/23/18   Jacinto Halim, PA-C  fluticasone [facility]) 50 MCG/ACT nasal spray Place 1-2 sprays into both nostrils daily. Patient not  taking: Reported on 01/26/2019 02/23/18   Maczis, Elmer Sow, PA-C  predniSONE (DELTASONE) 20 MG tablet Take 1 tablet (20 mg total) by mouth daily for 5 days. 05/25/20 05/30/20  Alexsandro Salek, [place] N, PA-C    Allergies    Patient has no known allergies.  Review of Systems   Review of Systems  Constitutional: Negative for fever.  Respiratory: Positive for shortness of breath and wheezing.     Physical Exam Updated Vital Signs BP (!) 144/94 (BP Location: Right Arm)   Pulse 83   Temp 97.6 F (36.4 C) (Oral)   Resp 20   SpO2 98%   Physical Exam Vitals and nursing note reviewed.  Constitutional:      Appearance: He is well-developed.  HENT:     Head: Normocephalic and atraumatic.  Eyes:     Conjunctiva/sclera: Conjunctivae normal.  Cardiovascular:     Rate and Rhythm: Normal rate and regular rhythm.  Pulmonary:     Effort: Pulmonary effort is normal. No respiratory distress.     Breath sounds: Wheezing (inspiratory and expiratory) present.     Comments: Speaking in full sentences with normal respiratory effort Neurological:     Mental Status: He is alert.  Psychiatric:        Mood and Affect: Mood normal.        Behavior: Behavior normal.     ED Results / Procedures /  Treatments   Labs (all labs ordered are listed, but only abnormal results are displayed) Labs Reviewed - No data to display  EKG None  Radiology No results found.  Procedures Procedures (including critical care time)  Medications Ordered in ED Medications  aerochamber Z-Stat Plus/medium 1 each (has no administration in time range)  albuterol (VENTOLIN HFA) 108 (90 Base) MCG/ACT inhaler 8 puff (8 puffs Inhalation Given 05/24/20 1450)  ipratropium (ATROVENT HFA) inhaler 4 puff (4 puffs Inhalation Given 05/24/20 1450)  predniSONE (DELTASONE) tablet 60 mg (60 mg Oral Given 05/24/20 1446)    ED Course  I have reviewed the triage vital signs and the nursing notes.  Pertinent labs & imaging results  that were available during my care of the patient were reviewed by me and considered in my medical decision making (see chart for details).  Jesus Haynes was evaluated in Emergency Department on 05/24/2020 for the symptoms described in the history of present illness. He was evaluated in the context of the global COVID-19 pandemic, which necessitated consideration that the patient might be at risk for infection with the SARS-CoV-2 virus that causes COVID-19. Institutional protocols and algorithms that pertain to the evaluation of patients at risk for COVID-19 are in a state of rapid change based on information released by regulatory bodies including the CDC and federal and state organizations. These policies and algorithms were followed during the patient's care in the ED.    MDM Rules/Calculators/A&P                          Patient presenting with symptoms consistent with asthma exacerbation. On initial exam, pt with inspiratory and expiratory wheezes and normal respiratory effort. O2 saturation is 98% on RA. Inhaled albuterol and atrovent given (unable to neb given current pandemic) and steroid administered. On re-evaluation lung exam improved and pt reports they are breathing at their baseline. No signs of respiratory distress.  Patient will be discharged with prednisone. Pt has been instructed to continue using prescribed medications and to speak with PCP about today's exacerbation. Return precautions discussed. Safe for discharge.   Final Clinical Impression(s) / ED Diagnoses Final diagnoses:  Exacerbation of intermittent asthma, unspecified asthma severity    Rx / DC Orders ED Discharge Orders         Ordered    predniSONE (DELTASONE) 20 MG tablet  Daily        05/24/20 1425    albuterol (PROVENTIL) (2.5 MG/3ML) 0.083% nebulizer solution  Every 4 hours PRN        05/24/20 1425    albuterol (VENTOLIN HFA) 108 (90 Base) MCG/ACT inhaler  Every 6 hours PRN        05/24/20 1425            Beck Cofer, [place] N, [place] 05/24/20 1554    Lorre Nick, MD 05/25/20 [phone]

## 2020-05-24 NOTE — Discharge Instructions (Addendum)
Please read instructions below. Use your albuterol inhaler every 4-6 hours as needed for shortness of breath or wheezing.  Starting tomorrow, begin taking the prednisone, as prescribed, until it is gone. It is recommended to take your steroid inhaler daily for better management of your asthma.  Follow up with your primary care provider. Return to the ER if you have shortness of breath not improved by your inhaler, or new or concerning symptoms.

## 2020-05-24 NOTE — ED Triage Notes (Signed)
Pt prevents wheezing with a flare up of his asthma. Pt is in no distress, has been same for same at [facility] but reports the inhaler isn't helping.

## 2020-07-25 ENCOUNTER — Emergency Department (HOSPITAL_COMMUNITY)
Admission: EM | Admit: 2020-07-25 | Discharge: 2020-07-26 | Disposition: A | Discharge status: Home / Self Care | Payer: Self-pay | Attending: Emergency Medicine | Admitting: Emergency Medicine

## 2020-07-25 ENCOUNTER — Encounter (HOSPITAL_COMMUNITY): Payer: Self-pay | Admitting: *Deleted

## 2020-07-25 ENCOUNTER — Other Ambulatory Visit: Payer: Self-pay

## 2020-07-25 DIAGNOSIS — Z87891 Personal history of nicotine dependence: Secondary | ICD-10-CM | POA: Insufficient documentation

## 2020-07-25 DIAGNOSIS — Z7951 Long term (current) use of inhaled steroids: Secondary | ICD-10-CM | POA: Insufficient documentation

## 2020-07-25 DIAGNOSIS — J4521 Mild intermittent asthma with (acute) exacerbation: Secondary | ICD-10-CM | POA: Insufficient documentation

## 2020-07-25 DIAGNOSIS — J029 Acute pharyngitis, unspecified: Secondary | ICD-10-CM | POA: Insufficient documentation

## 2020-07-25 LAB — GROUP A STREP BY PCR: Group A Strep by PCR: NOT DETECTED

## 2020-07-25 NOTE — ED Triage Notes (Signed)
Pt reports increase in asthma symptoms recently, minimal relief with his inhalers. No resp distress is noted at triage. Speaking in full sentences. Pt also having sore throat this am, is concerned for strep throat. Denies other symptoms such as headache, fever, chills.

## 2020-07-26 MED ORDER — PREDNISONE 10 MG PO TABS: 20.0000 mg | ORAL_TABLET | Freq: Every day | ORAL | 0 refills | Status: AC

## 2020-07-26 MED ORDER — PREDNISONE 20 MG PO TABS
60.0000 mg | ORAL_TABLET | Freq: Once | ORAL | Status: AC
  Administered 2020-07-26: 60 mg via ORAL
  Filled 2020-07-26: qty 3

## 2020-07-26 MED ORDER — IPRATROPIUM-ALBUTEROL 0.5-2.5 (3) MG/3ML IN SOLN
3.0000 mL | Freq: Once | RESPIRATORY_TRACT | Status: AC
  Administered 2020-07-26: 3 mL via RESPIRATORY_TRACT
  Filled 2020-07-26: qty 3

## 2020-07-26 MED ORDER — LIDOCAINE VISCOUS HCL 2 % MT SOLN: 15.0000 mL | OROMUCOSAL | 0 refills | Status: DC | PRN

## 2020-07-26 MED ORDER — ALBUTEROL SULFATE HFA 108 (90 BASE) MCG/ACT IN AERS
2.0000 | INHALATION_SPRAY | RESPIRATORY_TRACT | Status: DC
  Administered 2020-07-26: 2 via RESPIRATORY_TRACT
  Filled 2020-07-26: qty 6.7

## 2020-07-26 MED ORDER — ALBUTEROL SULFATE (2.5 MG/3ML) 0.083% IN NEBU: 2.5000 mg | INHALATION_SOLUTION | Freq: Four times a day (QID) | RESPIRATORY_TRACT | 12 refills | Status: DC | PRN

## 2020-07-26 MED ORDER — ALBUTEROL SULFATE HFA 108 (90 BASE) MCG/ACT IN AERS: 2.0000 | INHALATION_SPRAY | RESPIRATORY_TRACT | 0 refills | Status: DC | PRN

## 2020-07-26 NOTE — ED Notes (Signed)
Pt brought into triage for reassessment of breathing. Audible inspiratory/expiratory wheezing.

## 2020-07-26 NOTE — ED Provider Notes (Signed)
[facility] EMERGENCY DEPARTMENT Provider Note   CSN: [id number] Arrival date & time: 07/25/20  1014     History Chief Complaint  Patient presents with  . Asthma  . Sore Throat    Jesus Haynes is a 45 y.o. male with a history of asthma who presents to the emergency department with a chief complaint of shortness of breath.  The patient reports constant, worsening shortness of breath over the last few days.  He has been using his home albuterol inhaler and nebulizer that has been managing his symptoms until today.  He reports that he also awoke with a sore throat this morning.  States that his sore throat felt similar to previous episodes of strep throat, which prompted him to visit the ER.  While he was waiting for more than 16 hours in the ER due to COVID-19 pandemic and boarding, patient became increasingly short of breath.  He was using his albuterol inhaler, but ran out of the medication.  He reports an associated nonproductive cough, wheezing, and chest tightness.  No chest pain, abdominal pain, loss of sense of taste or smell, fever, chills, headache, leg swelling.  No recent long travel.  No history of VTE.  States that symptoms feel similar to previous asthma exacerbations.  Regarding his sore throat, he has had no drooling, trismus, dysphagia, or facial or neck swelling.  No other treatment prior to arrival.  He does not take any maintenance medications for his asthma.  No history of admissions to the hospital or to the ICU.  No history of intubation secondary to asthma exacerbation.  He is a former smoker.   The history is provided by the patient and medical records. No language interpreter was used.       Past Medical History:  Diagnosis Date  . Asthma     There are no problems to display for this patient.   History reviewed. No pertinent surgical history.     Family History  Problem Relation Age of Onset  . Heart failure Father     Social  History   Tobacco Use  . Smoking status: Former Smoker    Packs/day: 0.50    Types: Cigarettes    Quit date: 12/26/2012    Years since quitting: 7.5  . Smokeless tobacco: Never Used  Vaping Use  . Vaping Use: Former  . Quit date: 08/21/2018  . Substances: Nicotine  Substance Use Topics  . Alcohol use: No  . Drug use: No    Home Medications Prior to Admission medications   Medication Sig Start Date End Date Taking? Authorizing Provider  albuterol (PROVENTIL) (2.5 MG/3ML) 0.083% nebulizer solution Take 3 mLs (2.5 mg total) by nebulization every 6 (six) hours as needed for wheezing or shortness of breath. 07/26/20  Yes Paxton Binns A, PA-C  albuterol (VENTOLIN HFA) 108 (90 Base) MCG/ACT inhaler Inhale 2 puffs into the lungs every 4 (four) hours as needed for wheezing or shortness of breath. 07/26/20  Yes Lumen Brinlee A, PA-C  lidocaine (XYLOCAINE) 2 % solution Use as directed 15 mLs in the mouth or throat every 3 (three) hours as needed for mouth pain. 07/26/20  Yes Dillard Pascal A, PA-C  predniSONE (DELTASONE) 10 MG tablet Take 2 tablets (20 mg total) by mouth daily for 4 days. 07/26/20 07/30/20 Yes Haydyn Liddell A, PA-C  albuterol (PROVENTIL) (2.5 MG/3ML) 0.083% nebulizer solution Take 3 mLs (2.5 mg total) by nebulization every 4 (four) hours as needed for wheezing  or shortness of breath. 05/24/20   Robinson, [place] N, PA-C  albuterol (VENTOLIN HFA) 108 (90 Base) MCG/ACT inhaler Inhale 1-2 puffs into the lungs every 6 (six) hours as needed for wheezing or shortness of breath. 05/24/20   Robinson, [place] N, PA-C  cetirizine (ZYRTEC) 10 MG tablet Take 1 tablet (10 mg total) by mouth daily. Patient not taking: Reported on 01/26/2019 02/23/18 07/26/20  Maczis, Elmer Sow, PA-C  fluticasone [facility]) 50 MCG/ACT nasal spray Place 1-2 sprays into both nostrils daily. Patient not taking: Reported on 01/26/2019 02/23/18 07/26/20  Jacinto Halim, PA-C    Allergies    Patient has no known  allergies.  Review of Systems   Review of Systems  Constitutional: Negative for appetite change, chills, diaphoresis and fever.  HENT: Positive for sore throat. Negative for congestion, ear pain, sinus pressure, sinus pain and voice change.   Eyes: Negative for visual disturbance.  Respiratory: Positive for cough, shortness of breath and wheezing.   Cardiovascular: Positive for palpitations. Negative for chest pain.  Gastrointestinal: Negative for abdominal pain, constipation, diarrhea, nausea and vomiting.  Genitourinary: Negative for dysuria.  Musculoskeletal: Negative for back pain, myalgias and neck pain.  Skin: Negative for rash.  Allergic/Immunologic: Negative for immunocompromised state.  Neurological: Negative for seizures, syncope, weakness, numbness and headaches.  Psychiatric/Behavioral: Negative for confusion.    Physical Exam Updated Vital Signs BP (!) 130/100   Pulse 99   Temp 98 F (36.7 C) (Oral)   Resp (!) 26   Ht 5\' 6"  (1.676 m)   Wt 95.3 kg   SpO2 100%   BMI 33.89 kg/m   Physical Exam Vitals and nursing note reviewed.  Constitutional:      General: He is not in acute distress.    Appearance: He is well-developed. He is not ill-appearing, toxic-appearing or diaphoretic.  HENT:     Head: Normocephalic.     Right Ear: External ear normal.     Left Ear: External ear normal.     Nose: Nose normal. No congestion or rhinorrhea.     Mouth/Throat:     Mouth: Mucous membranes are moist.     Pharynx: No oropharyngeal exudate or posterior oropharyngeal erythema.  Eyes:     Conjunctiva/sclera: Conjunctivae normal.  Cardiovascular:     Rate and Rhythm: Normal rate and regular rhythm.     Pulses: Normal pulses.     Heart sounds: No murmur heard. No friction rub. No gallop.   Pulmonary:     Effort: Pulmonary effort is normal. No respiratory distress.     Breath sounds: No stridor. Wheezing present. No rhonchi or rales.     Comments: Lung sounds are diminished  bilaterally.  End expiratory wheezes noted in all fields.  Mild tachypnea.  No retractions or accessory muscle use.  Chest:     Chest wall: No tenderness.  Abdominal:     General: There is no distension.     Palpations: Abdomen is soft.  Musculoskeletal:     Cervical back: Neck supple.  Skin:    General: Skin is warm and dry.  Neurological:     Mental Status: He is alert.  Psychiatric:        Behavior: Behavior normal.     ED Results / Procedures / Treatments   Labs (all labs ordered are listed, but only abnormal results are displayed) Labs Reviewed  GROUP A STREP BY PCR    EKG EKG Interpretation  Date/Time:  Monday July 25 2020 11:24:25 EST  Ventricular Rate:  97 PR Interval:  134 QRS Duration: 76 QT Interval:  322 QTC Calculation: 408 R Axis:   81 Text Interpretation: Normal sinus rhythm Normal ECG When compared with ECG of 01/26/2019, No significant change was found Confirmed by Dione Booze ([id number]) on 07/25/2020 11:58:55 PM   Radiology No results found.  Procedures Procedures   Medications Ordered in ED Medications  albuterol (VENTOLIN HFA) 108 (90 Base) MCG/ACT inhaler 2 puff (2 puffs Inhalation Given 07/26/20 0050)  ipratropium-albuterol (DUONEB) 0.5-2.5 (3) MG/3ML nebulizer solution 3 mL (3 mLs Nebulization Given 07/26/20 0109)  predniSONE (DELTASONE) tablet 60 mg (60 mg Oral Given 07/26/20 0109)    ED Course  I have reviewed the triage vital signs and the nursing notes.  Pertinent labs & imaging results that were available during my care of the patient were reviewed by me and considered in my medical decision making (see chart for details).    MDM Rules/Calculators/A&P                          45 year old male with a history of asthma who has had constant, worsening shortness of breath, wheezing, chest tightness, nonproductive cough for the last few days that feels similar to previous asthma exacerbations.  He woke with a sore throat this morning and  was concerned that he may have strep throat, which prompted his visit to the ER, became increasingly dyspneic while waiting for many hours and ran out of his albuterol inhaler.  Mildly tachypneic on my evaluation.  Oxygen saturation has been maintained between 97 and 100% both at rest and with exertion.  He is normotensive and afebrile.  No tachycardia.  Differential diagnosis includes acute congestive heart failure, PE, asthma exacerbation, COVID-19, streptococcal pharyngitis, or viral URI.  On exam, he has end expiratory wheezes in all fields, but lung sounds are very diminished despite using 4 puffs of albuterol at bedside.  Patient was given a DuoNeb and prednisone and when reassessed 30 minutes later air movement was markedly improved.  Wheezing had resolved.  He was able to speak in complete, fluent sentences without increased work of breathing.  Oxygen saturations were maintained in the upper 90s.  Labs were reviewed and independently interpreted by me.  Strep PCR is negative.  Patient declines COVID-19 test.  At this time, patient is hemodynamically stable to no acute distress.  Will discharge home with a refill of his home albuterol, albuterol nebulizer, burst of prednisone, and viscous lidocaine.  He has been given a referral to [facility] and wellness to get established with a PCP.  ER return precautions given.  He is hemodynamically stable no acute distress.  Safe discharge home with outpatient follow-up as needed.  Final Clinical Impression(s) / ED Diagnoses Final diagnoses:  Mild intermittent asthma with acute exacerbation  Sore throat    Rx / DC Orders ED Discharge Orders         Ordered    albuterol (PROVENTIL) (2.5 MG/3ML) 0.083% nebulizer solution  Every 6 hours PRN        07/26/20 0210    albuterol (VENTOLIN HFA) 108 (90 Base) MCG/ACT inhaler  Every 4 hours PRN       Note to Pharmacy: Yellow pump, not blue   07/26/20 0210    predniSONE (DELTASONE) 10 MG tablet  Daily         07/26/20 0210    lidocaine (XYLOCAINE) 2 % solution  Every  3 hours PRN  07/26/20 0210           Barkley BoardsMcDonald, Elzie Sheets A, PA-C 07/26/20 [phone]    Nicanor AlconPalumbo, April, MD 07/26/20 [phone]

## 2020-07-26 NOTE — Discharge Instructions (Signed)
Thank you for allowing me to care for you today in the Emergency Department.   You can call [facility] to get established with primary care to help assist you with refills in the future.   Use albuterol as prescribed.  Starting tomorrow, take prednisone as prescribed.  You can swallow lidocaine every 3 hours as needed for sore throat.  Return to the ER if you can't breath, pass out, if your fingers or lips turn blue, or have other, new concerning symptoms.

## 2020-09-27 ENCOUNTER — Other Ambulatory Visit: Payer: Self-pay

## 2020-09-27 ENCOUNTER — Emergency Department (HOSPITAL_COMMUNITY)
Admission: EM | Admit: 2020-09-27 | Discharge: 2020-09-27 | Disposition: A | Discharge status: Home / Self Care | Payer: Self-pay | Attending: Emergency Medicine | Admitting: Emergency Medicine

## 2020-09-27 ENCOUNTER — Encounter (HOSPITAL_COMMUNITY): Payer: Self-pay

## 2020-09-27 DIAGNOSIS — Z87891 Personal history of nicotine dependence: Secondary | ICD-10-CM | POA: Insufficient documentation

## 2020-09-27 DIAGNOSIS — J45901 Unspecified asthma with (acute) exacerbation: Secondary | ICD-10-CM | POA: Insufficient documentation

## 2020-09-27 MED ORDER — ALBUTEROL SULFATE HFA 108 (90 BASE) MCG/ACT IN AERS: 1.0000 | INHALATION_SPRAY | Freq: Four times a day (QID) | RESPIRATORY_TRACT | 1 refills | Status: DC | PRN

## 2020-09-27 MED ORDER — PREDNISONE 20 MG PO TABS: 40.0000 mg | ORAL_TABLET | Freq: Every day | ORAL | 0 refills | Status: DC

## 2020-09-27 MED ORDER — ALBUTEROL SULFATE HFA 108 (90 BASE) MCG/ACT IN AERS
2.0000 | INHALATION_SPRAY | RESPIRATORY_TRACT | Status: DC | PRN
  Administered 2020-09-27: 2 via RESPIRATORY_TRACT
  Filled 2020-09-27: qty 6.7

## 2020-09-27 NOTE — ED Triage Notes (Signed)
Patient states he is out of his Albuterol inhaler and but has a nebulizer. patient states he was at work and began wheezing.

## 2020-09-27 NOTE — ED Notes (Signed)
An After Visit Summary was printed and given to the patient. Discharge instructions given and no further questions at this time.  

## 2020-09-27 NOTE — ED Provider Notes (Signed)
[facility]-EMERGENCY DEPT Provider Note   CSN: [id number] Arrival date & time: 09/27/20  1735     History Chief Complaint  Patient presents with  . Asthma    Jesus Haynes is a 45 y.o. male.  Patient with history of asthma presents to the emergency department today for wheezing.  Patient states that he was at work today and started wheezing.  He does work in a [employer], contracting.  Typically his symptoms get worse this time of year with allergies.  He ran out of his albuterol inhaler prompting emergency department visit today.  No fevers or other URI symptoms.  He has an albuterol nebulizer machine at home, but can obviously not use this at work.  The onset of this condition was acute. The course is constant. Aggravating factors: none. Alleviating factors: none.          Past Medical History:  Diagnosis Date  . Asthma     There are no problems to display for this patient.   History reviewed. No pertinent surgical history.     Family History  Problem Relation Age of Onset  . Heart failure Father     Social History   Tobacco Use  . Smoking status: Former Smoker    Packs/day: 0.50    Types: Cigarettes    Quit date: 12/26/2012    Years since quitting: 7.7  . Smokeless tobacco: Never Used  Vaping Use  . Vaping Use: Former  . Quit date: 08/21/2018  . Substances: Nicotine  Substance Use Topics  . Alcohol use: No  . Drug use: No    Home Medications Prior to Admission medications   Medication Sig Start Date End Date Taking? Authorizing Provider  albuterol (PROVENTIL) (2.5 MG/3ML) 0.083% nebulizer solution Take 3 mLs (2.5 mg total) by nebulization every 4 (four) hours as needed for wheezing or shortness of breath. 05/24/20   Robinson, [place] N, PA-C  albuterol (PROVENTIL) (2.5 MG/3ML) 0.083% nebulizer solution Take 3 mLs (2.5 mg total) by nebulization every 6 (six) hours as needed for wheezing or shortness of breath. 07/26/20   McDonald, Mia A,  PA-C  albuterol (VENTOLIN HFA) 108 (90 Base) MCG/ACT inhaler Inhale 1-2 puffs into the lungs every 6 (six) hours as needed for wheezing or shortness of breath. 05/24/20   Robinson, [place] N, PA-C  albuterol (VENTOLIN HFA) 108 (90 Base) MCG/ACT inhaler Inhale 2 puffs into the lungs every 4 (four) hours as needed for wheezing or shortness of breath. 07/26/20   McDonald, Mia A, PA-C  lidocaine (XYLOCAINE) 2 % solution Use as directed 15 mLs in the mouth or throat every 3 (three) hours as needed for mouth pain. 07/26/20   McDonald, Mia A, PA-C  cetirizine (ZYRTEC) 10 MG tablet Take 1 tablet (10 mg total) by mouth daily. Patient not taking: Reported on 01/26/2019 02/23/18 07/26/20  Maczis, Elmer Sow, PA-C  fluticasone [facility]) 50 MCG/ACT nasal spray Place 1-2 sprays into both nostrils daily. Patient not taking: Reported on 01/26/2019 02/23/18 07/26/20  Jacinto Halim, PA-C    Allergies    Patient has no known allergies.  Review of Systems   Review of Systems  Constitutional: Negative for chills, fatigue and fever.  HENT: Negative for congestion, ear pain, rhinorrhea, sinus pressure and sore throat.   Eyes: Negative for redness.  Respiratory: Positive for shortness of breath and wheezing. Negative for cough.   Gastrointestinal: Negative for abdominal pain, diarrhea, nausea and vomiting.  Genitourinary: Negative for dysuria.  Musculoskeletal:  Negative for myalgias and neck stiffness.  Skin: Negative for rash.  Neurological: Negative for headaches.  Hematological: Negative for adenopathy.    Physical Exam Updated Vital Signs BP (!) 164/98 (BP Location: Right Arm)   Pulse (!) 106   Temp 98.4 F (36.9 C) (Oral)   Resp 17   Ht 5\' 7"  (1.702 m)   Wt 95.3 kg   SpO2 97%   BMI 32.89 kg/m   Physical Exam Vitals and nursing note reviewed.  Constitutional:      Appearance: He is well-developed.  HENT:     Head: Normocephalic and atraumatic.  Eyes:     Conjunctiva/sclera: Conjunctivae normal.   Cardiovascular:     Rate and Rhythm: Normal rate and regular rhythm.  Pulmonary:     Effort: Pulmonary effort is normal. No respiratory distress.     Breath sounds: No stridor. Wheezing present. No rhonchi or rales.     Comments: Breath sounds are slightly diminished globally with mild expiratory wheezing.  No accessory muscle use or signs of distress. Musculoskeletal:     Cervical back: Normal range of motion and neck supple.  Skin:    General: Skin is warm and dry.  Neurological:     Mental Status: He is alert.     ED Results / Procedures / Treatments   Labs (all labs ordered are listed, but only abnormal results are displayed) Labs Reviewed - No data to display  EKG None  Radiology No results found.  Procedures Procedures   Medications Ordered in ED Medications  albuterol (VENTOLIN HFA) 108 (90 Base) MCG/ACT inhaler 2 puff (has no administration in time range)    ED Course  I have reviewed the triage vital signs and the nursing notes.  Pertinent labs & imaging results that were available during my care of the patient were reviewed by me and considered in my medical decision making (see chart for details).  Patient seen and examined.  He looks well.  Patient will be given an albuterol inhaler, prescriptions for 5 days of prednisone and additional inhalers.  Vital signs reviewed and are as follows: BP (!) 164/98 (BP Location: Right Arm)   Pulse (!) 106   Temp 98.4 F (36.9 C) (Oral)   Resp 17   Ht 5\' 7"  (1.702 m)   Wt 95.3 kg   SpO2 97%   BMI 32.89 kg/m   Encouraged PCP follow-up. Patient urged to return with worsening symptoms or other concerns. Patient verbalized understanding and agrees with plan.     MDM Rules/Calculators/A&P                          Patient with mild asthma exacerbation, here today because he is out of his medication to use while he is working.  Symptoms are exacerbated due to his work environment and seasonal allergies.  No  respiratory distress, hypoxia, or tachypnea to suggest need for additional treatment today.   Final Clinical Impression(s) / ED Diagnoses Final diagnoses:  Exacerbation of asthma, unspecified asthma severity, unspecified whether persistent     Rx / DC Orders ED Discharge Orders         Ordered    predniSONE (DELTASONE) 20 MG tablet  Daily        09/27/20 1810    albuterol (VENTOLIN HFA) 108 (90 Base) MCG/ACT inhaler  Every 6 hours PRN        09/27/20 1810  Renne Crigler, PA-C 09/27/20 1811    Tilden Fossa, MD 09/28/20 [phone]

## 2020-09-27 NOTE — Discharge Instructions (Signed)
Please read and follow all provided instructions.  Your diagnoses today include:  1. Exacerbation of asthma, unspecified asthma severity, unspecified whether persistent     Tests performed today include:  Vital signs. See below for your results today.   Medications prescribed:   Albuterol inhaler - medication that opens up your airway  Use inhaler as follows: 1-2 puffs with spacer every 4 hours as needed for wheezing, cough, or shortness of breath.    Prednisone - steroid medicine   It is best to take this medication in the morning to prevent sleeping problems. If you are diabetic, monitor your blood sugar closely and stop taking Prednisone if blood sugar is over 300. Take with food to prevent stomach upset.   Take any prescribed medications only as directed.  Home care instructions:  Follow any educational materials contained in this packet.  Follow-up instructions: Please follow-up with your primary care provider in the next 3 days for further evaluation of your symptoms and management of your asthma.  Return instructions:   Please return to the Emergency Department if you experience worsening symptoms.  Please return with worsening wheezing, shortness of breath, or difficulty breathing.  Return with persistent fever above 101F.   Please return if you have any other emergent concerns.  Additional Information:  Your vital signs today were: BP (!) 164/98 (BP Location: Right Arm)   Pulse (!) 106   Temp 98.4 F (36.9 C) (Oral)   Resp 17   Ht 5\' 7"  (1.702 m)   Wt 95.3 kg   SpO2 97%   BMI 32.89 kg/m  If your blood pressure (BP) was elevated above 135/85 this visit, please have this repeated by your doctor within one month. --------------

## 2020-10-22 ENCOUNTER — Other Ambulatory Visit: Payer: Self-pay

## 2020-10-22 ENCOUNTER — Encounter (HOSPITAL_COMMUNITY): Payer: Self-pay | Admitting: Emergency Medicine

## 2020-10-22 ENCOUNTER — Emergency Department (HOSPITAL_COMMUNITY)
Admission: EM | Admit: 2020-10-22 | Discharge: 2020-10-22 | Disposition: A | Discharge status: Home / Self Care | Payer: Self-pay | Attending: Student | Admitting: Student

## 2020-10-22 DIAGNOSIS — S39012A Strain of muscle, fascia and tendon of lower back, initial encounter: Secondary | ICD-10-CM | POA: Insufficient documentation

## 2020-10-22 DIAGNOSIS — J45909 Unspecified asthma, uncomplicated: Secondary | ICD-10-CM | POA: Insufficient documentation

## 2020-10-22 DIAGNOSIS — M62830 Muscle spasm of back: Secondary | ICD-10-CM | POA: Insufficient documentation

## 2020-10-22 DIAGNOSIS — X58XXXA Exposure to other specified factors, initial encounter: Secondary | ICD-10-CM | POA: Insufficient documentation

## 2020-10-22 DIAGNOSIS — Z87891 Personal history of nicotine dependence: Secondary | ICD-10-CM | POA: Insufficient documentation

## 2020-10-22 DIAGNOSIS — Z7952 Long term (current) use of systemic steroids: Secondary | ICD-10-CM | POA: Insufficient documentation

## 2020-10-22 MED ORDER — IBUPROFEN 800 MG PO TABS
800.0000 mg | ORAL_TABLET | Freq: Once | ORAL | Status: AC
  Administered 2020-10-22: 800 mg via ORAL
  Filled 2020-10-22: qty 1

## 2020-10-22 MED ORDER — LIDOCAINE 5 % EX PTCH
1.0000 | MEDICATED_PATCH | CUTANEOUS | Status: DC
  Administered 2020-10-22: 1 via TRANSDERMAL
  Filled 2020-10-22: qty 1

## 2020-10-22 MED ORDER — METHOCARBAMOL 500 MG PO TABS: 500.0000 mg | ORAL_TABLET | Freq: Two times a day (BID) | ORAL | 0 refills | Status: DC

## 2020-10-22 MED ORDER — LIDOCAINE 5 % EX PTCH: 1.0000 | MEDICATED_PATCH | CUTANEOUS | 0 refills | Status: DC

## 2020-10-22 NOTE — ED Triage Notes (Signed)
Patient c/o lower back pain x2 weeks without injury. Ambulatory. Pain worsens with movement. Denies changes in bowel and bladder, denies numbness and tingling.

## 2020-10-22 NOTE — ED Provider Notes (Signed)
[facility]-EMERGENCY DEPT Provider Note   CSN: [id number] Arrival date & time: 10/22/20  1204     History Chief Complaint  Patient presents with  . Back Pain    Jesus Haynes is a 45 y.o. male who presents with concern for 2 weeks of progressively worsening low back spasm and pain.  Pain worsened with movement.  Denies any injury or trauma.  States he has history of similar symptoms in the past.  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  Improvement in symptoms with administration of ibuprofen acute inflammation.  He denies any numbness, tingling, weakness in his lower extremities, denies any symptoms of saddle anesthesia or incontinence.  I personally reviewed this patient's medical records.  His history of asthma not on any medications every day.  No fevers or chills, no history of immunosuppression or IV drug use. HPI     Past Medical History:  Diagnosis Date  . Asthma     There are no problems to display for this patient.   History reviewed. No pertinent surgical history.     Family History  Problem Relation Age of Onset  . Heart failure Father     Social History   Tobacco Use  . Smoking status: Former Smoker    Packs/day: 0.50    Types: Cigarettes    Quit date: 12/26/2012    Years since quitting: 7.8  . Smokeless tobacco: Never Used  Vaping Use  . Vaping Use: Former  . Quit date: 08/21/2018  . Substances: Nicotine  Substance Use Topics  . Alcohol use: No  . Drug use: No    Home Medications Prior to Admission medications   Medication Sig Start Date End Date Taking? Authorizing Provider  lidocaine (LIDODERM) 5 % Place 1 patch onto the skin daily. Remove & Discard patch within 12 hours or as directed by MD 10/22/20  Yes Daimian Sudberry, Lupe Carney R, PA-C  methocarbamol (ROBAXIN) 500 MG tablet  Take 1 tablet (500 mg total) by mouth 2 (two) times daily. 10/22/20  Yes Makye Radle R, PA-C  albuterol (PROVENTIL) (2.5 MG/3ML) 0.083% nebulizer solution Take 3 mLs (2.5 mg total) by nebulization every 4 (four) hours as needed for wheezing or shortness of breath. 05/24/20   Robinson, [place] N, PA-C  albuterol (VENTOLIN HFA) 108 (90 Base) MCG/ACT inhaler Inhale 1-2 puffs into the lungs every 6 (six) hours as needed for wheezing or shortness of breath. 09/27/20   Renne Crigler, PA-C  lidocaine (XYLOCAINE) 2 % solution Use as directed 15 mLs in the mouth or throat every 3 (three) hours as needed for mouth pain. 07/26/20   McDonald, Mia A, PA-C  predniSONE (DELTASONE) 20 MG tablet Take 2 tablets (40 mg total) by mouth daily. 09/27/20   Renne Crigler, PA-C  cetirizine (ZYRTEC) 10 MG tablet Take 1 tablet (10 mg total) by mouth daily. Patient not taking: Reported on 01/26/2019 02/23/18 07/26/20  Maczis, Elmer Sow, PA-C  fluticasone [facility]) 50 MCG/ACT nasal spray Place 1-2 sprays into both nostrils daily. Patient not taking: Reported on 01/26/2019 02/23/18 07/26/20  Jacinto Halim, PA-C    Allergies    Patient has no known allergies.  Review of Systems   Review of Systems  Constitutional: Negative.   HENT: Negative.   Respiratory: Negative.   Cardiovascular: Negative.   Gastrointestinal:  Negative.   Musculoskeletal: Positive for back pain and myalgias.  Skin: Negative.   Allergic/Immunologic: Negative.   Neurological: Negative.     Physical Exam Updated Vital Signs BP 136/85 (BP Location: Left Arm)   Pulse 98   Temp 98.2 F (36.8 C)   Resp 18   SpO2 98%   Physical Exam Vitals and nursing note reviewed.  Constitutional:      Appearance: He is not toxic-appearing.  HENT:     Head: Normocephalic and atraumatic.     Mouth/Throat:     Mouth: Mucous membranes are moist.     Pharynx: No oropharyngeal exudate or posterior oropharyngeal erythema.  Eyes:     General: Lids are  normal. Vision grossly intact.        Right eye: No discharge.        Left eye: No discharge.     Conjunctiva/sclera: Conjunctivae normal.  Neck:     Trachea: Trachea and phonation normal.  Cardiovascular:     Rate and Rhythm: Normal rate and regular rhythm.     Pulses: Normal pulses.     Heart sounds: Normal heart sounds. No murmur heard.   Pulmonary:     Effort: Pulmonary effort is normal. No respiratory distress.     Breath sounds: Normal breath sounds. No wheezing or rales.  Chest:     Chest wall: No mass, lacerations, deformity, swelling, tenderness or crepitus.  Abdominal:     General: Bowel sounds are normal. There is no distension.     Palpations: Abdomen is soft.     Tenderness: There is no abdominal tenderness. There is no right CVA tenderness, left CVA tenderness, guarding or rebound.  Musculoskeletal:        General: No deformity.     Cervical back: Normal, normal range of motion and neck supple. No spasms, tenderness, bony tenderness or crepitus. No pain with movement, spinous process tenderness or muscular tenderness.     Thoracic back: Spasms present. No tenderness or bony tenderness.     Lumbar back: Spasms and tenderness present. No deformity or bony tenderness. Negative right straight leg raise test and negative left straight leg raise test.       Back:     Right hip: Normal.     Left hip: Normal.     Right upper leg: Normal.     Left upper leg: Normal.     Right knee: Normal.     Left knee: Normal.     Right lower leg: Normal. No edema.     Left lower leg: Normal. No edema.     Right ankle: Normal.     Right Achilles Tendon: Normal.     Left ankle: Normal.     Left Achilles Tendon: Normal.     Right foot: Normal.     Left foot: Normal.  Lymphadenopathy:     Cervical: No cervical adenopathy.  Skin:    General: Skin is warm and dry.  Neurological:     Mental Status: He is alert and oriented to person, place, and time. Mental status is at baseline.      Sensory: Sensation is intact.     Motor: Motor function is intact.     Gait: Gait is intact.  Psychiatric:        Mood and Affect: Mood normal.     ED Results / Procedures / Treatments   Labs (all labs ordered are listed, but only abnormal results are displayed) Labs Reviewed - No data  to display  EKG None  Radiology No results found.  Procedures Procedures   Medications Ordered in ED Medications  lidocaine (LIDODERM) 5 % 1 patch (1 patch Transdermal Patch Applied 10/22/20 1257)  ibuprofen (ADVIL) tablet 800 mg (800 mg Oral Given 10/22/20 1257)    ED Course  I have reviewed the triage vital signs and the nursing notes.  Pertinent labs & imaging results that were available during my care of the patient were reviewed by me and considered in my medical decision making (see chart for details).    MDM Rules/Calculators/A&P                         45 year old male with history of muscular spasm in the past presents with concern for tightness in his back and bilateral low back pain x2 weeks.  Differential diagnosis for the patient physical exam is not limited to acute musculoskeletal injury/muscle spasm, epidural abscess or hematoma, cord compression, disc herniation, acute fracture.  Physical exam is very reassuring.  Cardiopulmonary exam is normal, abdominal exam is benign.  There is spasm and tenderness palpation of the lumbar paraspinous musculature bilaterally without midline tenderness, erythema, warmth to the touch, or any skin changes.  Patient is neurovascularly intact in all 4 extremities and his gait is intact.  No further work-up warranted in the ED at this time.  Patient's presentation consistent with muscle spasm in the low back likely secondary to overuse at work and poor core strength.  Will administer ibuprofen and a Lidoderm patch here in the emergency department.  Will discharge with course of Robaxin and instructions to proceed with topical analgesia as needed.   Additional provide contact information for [facility] with whom you may establish care; recommend outpatient physical therapy.  Olivia voiced understanding of his medical evaluation and treatment plan.  Each of his questions answered to his expressed satisfaction.  Return precautions given.  Patient is well-appearing, stable, and appropriate for discharge at this time.  This chart was dictated using voice recognition software, Dragon. Despite the best efforts of this provider to proofread and correct errors, errors may still occur which can change documentation meaning.  Final Clinical Impression(s) / ED Diagnoses Final diagnoses:  Muscle spasm of back  Strain of lumbar region, initial encounter    Rx / DC Orders ED Discharge Orders         Ordered    methocarbamol (ROBAXIN) 500 MG tablet  2 times daily        10/22/20 1330    lidocaine (LIDODERM) 5 %  Every 24 hours        10/22/20 1331           Roshaun Pound, Eugene Gavia, PA-C 10/22/20 1411    Alvira Monday, MD 10/25/20 1141

## 2020-10-22 NOTE — Discharge Instructions (Signed)
You were seen in the emergency department today for your back pain.  Your physical exam and vital signs are very reassuring.  The muscles in your low back are in what is called spasm, meaning they are inappropriately tightened up.  This can be quite painful.  To help with your pain you may take Tylenol and / or NSAID medication (such as ibuprofen or naproxen) to help with your pain.  Additionally, you have been prescribed a muscle relaxer called Robaxin to help relieve some of the muscle spasm.  Please be advised that this medication may make you very sleepy, so you should not drive or operate heavy machinery while you are taking it.  You may also utilize topical pain relief such as Biofreeze, IcyHot, or topical lidocaine patches.  I also recommend that you apply heat to the area, such as a hot shower or heating pad, and follow heat application with massage of the muscles that are most tight.  Please return to the emergency department if you develop any numbness/tingling/weakness in your arms or legs, any difficulty urinating, or urinary incontinence chest pain, shortness of breath, abdominal pain, nausea or vomiting that does not stop, or any other new severe symptoms.  Below is the contact information for [facility], with whom you may follow-up for reevaluation and referral to physical therapy.

## 2020-12-06 ENCOUNTER — Other Ambulatory Visit: Payer: Self-pay

## 2020-12-06 ENCOUNTER — Encounter (HOSPITAL_COMMUNITY): Payer: Self-pay | Admitting: *Deleted

## 2020-12-06 ENCOUNTER — Emergency Department (HOSPITAL_COMMUNITY)
Admission: EM | Admit: 2020-12-06 | Discharge: 2020-12-06 | Disposition: A | Discharge status: Home / Self Care | Payer: Self-pay | Attending: Emergency Medicine | Admitting: Emergency Medicine

## 2020-12-06 DIAGNOSIS — J45901 Unspecified asthma with (acute) exacerbation: Secondary | ICD-10-CM | POA: Insufficient documentation

## 2020-12-06 DIAGNOSIS — Z7951 Long term (current) use of inhaled steroids: Secondary | ICD-10-CM | POA: Insufficient documentation

## 2020-12-06 DIAGNOSIS — Z87891 Personal history of nicotine dependence: Secondary | ICD-10-CM | POA: Insufficient documentation

## 2020-12-06 MED ORDER — ALBUTEROL SULFATE HFA 108 (90 BASE) MCG/ACT IN AERS
6.0000 | INHALATION_SPRAY | Freq: Once | RESPIRATORY_TRACT | Status: AC
  Administered 2020-12-06: 6 via RESPIRATORY_TRACT
  Filled 2020-12-06: qty 6.7

## 2020-12-06 MED ORDER — PREDNISONE 20 MG PO TABS: 40.0000 mg | ORAL_TABLET | Freq: Every day | ORAL | 0 refills | Status: DC

## 2020-12-06 MED ORDER — ALBUTEROL SULFATE (2.5 MG/3ML) 0.083% IN NEBU: 2.5000 mg | INHALATION_SOLUTION | RESPIRATORY_TRACT | 0 refills | Status: DC | PRN

## 2020-12-06 MED ORDER — ALBUTEROL SULFATE HFA 108 (90 BASE) MCG/ACT IN AERS: 1.0000 | INHALATION_SPRAY | Freq: Four times a day (QID) | RESPIRATORY_TRACT | 0 refills | Status: DC | PRN

## 2020-12-06 MED ORDER — AEROCHAMBER Z-STAT PLUS/MEDIUM MISC
1.0000 | Freq: Once | Status: AC
  Administered 2020-12-06: 1
  Filled 2020-12-06: qty 1

## 2020-12-06 MED ORDER — PREDNISONE 20 MG PO TABS
60.0000 mg | ORAL_TABLET | Freq: Once | ORAL | Status: AC
  Administered 2020-12-06: 60 mg via ORAL
  Filled 2020-12-06: qty 3

## 2020-12-06 NOTE — ED Provider Notes (Signed)
[facility]-EMERGENCY DEPT Provider Note   CSN: [id number] Arrival date & time: 12/06/20  1204     History Chief Complaint  Patient presents with  . Asthma    ALTAN KRAAI is a 45 y.o. male.  SAAHIL HERBSTER is a 45 y.o. male with a history of asthma, who presents with asthma exacerbation.  Reports asthma has been flaring up over the past few days.  He reports he used the last of his albuterol inhaler.  Reports he has been having wheezing, shortness of breath, some mild chest tightness.  This feels exactly like his prior asthma exacerbations, often brought on by changes in weather.  No associated fevers, cough or congestion.  No chest pain.  No known sick contacts.  No other aggravating relieving factors.  The history is provided by the patient.       Past Medical History:  Diagnosis Date  . Asthma     There are no problems to display for this patient.   History reviewed. No pertinent surgical history.     Family History  Problem Relation Age of Onset  . Heart failure Father     Social History   Tobacco Use  . Smoking status: Former Smoker    Packs/day: 0.50    Types: Cigarettes    Quit date: 12/26/2012    Years since quitting: 7.9  . Smokeless tobacco: Never Used  Vaping Use  . Vaping Use: Former  . Quit date: 08/21/2018  . Substances: Nicotine  Substance Use Topics  . Alcohol use: No  . Drug use: No    Home Medications Prior to Admission medications   Medication Sig Start Date End Date Taking? Authorizing Provider  albuterol (PROVENTIL) (2.5 MG/3ML) 0.083% nebulizer solution Take 3 mLs (2.5 mg total) by nebulization every 4 (four) hours as needed for wheezing or shortness of breath. 12/06/20   Dartha Lodge, PA-C  albuterol (VENTOLIN HFA) 108 (90 Base) MCG/ACT inhaler Inhale 1-2 puffs into the lungs every 6 (six) hours as needed for wheezing or shortness of breath. 12/06/20   Dartha Lodge, PA-C  lidocaine (LIDODERM) 5 % Place 1 patch  onto the skin daily. Remove & Discard patch within 12 hours or as directed by MD 10/22/20   Sponseller, Lupe Carney R, PA-C  lidocaine (XYLOCAINE) 2 % solution Use as directed 15 mLs in the mouth or throat every 3 (three) hours as needed for mouth pain. 07/26/20   McDonald, Mia A, PA-C  methocarbamol (ROBAXIN) 500 MG tablet Take 1 tablet (500 mg total) by mouth 2 (two) times daily. 10/22/20   Sponseller, Eugene Gavia, PA-C  predniSONE (DELTASONE) 20 MG tablet Take 2 tablets (40 mg total) by mouth daily. 12/06/20   Dartha Lodge, PA-C  cetirizine (ZYRTEC) 10 MG tablet Take 1 tablet (10 mg total) by mouth daily. Patient not taking: Reported on 01/26/2019 02/23/18 07/26/20  Maczis, Elmer Sow, PA-C  fluticasone [facility]) 50 MCG/ACT nasal spray Place 1-2 sprays into both nostrils daily. Patient not taking: Reported on 01/26/2019 02/23/18 07/26/20  Jacinto Halim, PA-C    Allergies    Patient has no known allergies.  Review of Systems   Review of Systems  Constitutional: Negative for chills and fever.  HENT: Negative for congestion, rhinorrhea and sore throat.   Respiratory: Positive for chest tightness and wheezing. Negative for cough.   Gastrointestinal: Negative for nausea and vomiting.  Neurological: Negative for syncope and light-headedness.  All other systems reviewed and are  negative.   Physical Exam Updated Vital Signs BP (!) 140/100 (BP Location: Left Arm)   Pulse 73   Temp 98.5 F (36.9 C) (Oral)   Resp (!) 28   Ht 5\' 6"  (1.676 m)   Wt 93 kg   SpO2 96%   BMI 33.09 kg/m   Physical Exam Vitals and nursing note reviewed.  Constitutional:      General: He is not in acute distress.    Appearance: Normal appearance. He is well-developed and normal weight. He is not ill-appearing or diaphoretic.  HENT:     Head: Normocephalic and atraumatic.     Nose: No congestion.     Mouth/Throat:     Mouth: Mucous membranes are moist.     Pharynx: Oropharynx is clear.  Eyes:     General:         Right eye: No discharge.        Left eye: No discharge.  Cardiovascular:     Rate and Rhythm: Normal rate and regular rhythm.     Heart sounds: Normal heart sounds.  Pulmonary:     Effort: No respiratory distress.     Breath sounds: Wheezing present. No rhonchi or rales.     Comments: On initial exam slightly increased work of breathing with some tachypnea noted, able to speak in full sentences, on auscultation patient with wheezing noted bilaterally, no rales or rhonchi Abdominal:     Tenderness: There is no abdominal tenderness.  Musculoskeletal:        General: No deformity.  Skin:    General: Skin is warm and dry.  Neurological:     Mental Status: He is alert and oriented to person, place, and time.     Coordination: Coordination normal.  Psychiatric:        Mood and Affect: Mood normal.        Behavior: Behavior normal.     ED Results / Procedures / Treatments   Labs (all labs ordered are listed, but only abnormal results are displayed) Labs Reviewed - No data to display  EKG None  Radiology No results found.  Procedures Procedures   Medications Ordered in ED Medications  predniSONE (DELTASONE) tablet 60 mg (60 mg Oral Given 12/06/20 1257)  albuterol (VENTOLIN HFA) 108 (90 Base) MCG/ACT inhaler 6 puff (6 puffs Inhalation Given 12/06/20 1257)  aerochamber Z-Stat Plus/medium 1 each (1 each Other Given 12/06/20 1258)    ED Course  I have reviewed the triage vital signs and the nursing notes.  Pertinent labs & imaging results that were available during my care of the patient were reviewed by me and considered in my medical decision making (see chart for details).    MDM Rules/Calculators/A&P                          Presentation consistent with asthma exacerbation, wheezing on exam but maintaining O2 sats of 98%, no current signs of respiratory distress. Lung exam improved after albuterol treatment. Prednisone given in the ED and pt will be discharged with 5 day  burst. Pt states they are breathing at baseline. Pt has been instructed to continue using prescribed medications and to speak with PCP about today's exacerbation.   Final Clinical Impression(s) / ED Diagnoses Final diagnoses:  Exacerbation of asthma, unspecified asthma severity, unspecified whether persistent    Rx / DC Orders ED Discharge Orders         Ordered    albuterol (  PROVENTIL) (2.5 MG/3ML) 0.083% nebulizer solution  Every 4 hours PRN        12/06/20 1318    albuterol (VENTOLIN HFA) 108 (90 Base) MCG/ACT inhaler  Every 6 hours PRN        12/06/20 1318    predniSONE (DELTASONE) 20 MG tablet  Daily        12/06/20 1318           Dartha Lodge, [place] 12/06/20 1324    Derwood Kaplan, MD 12/06/20 1546

## 2020-12-06 NOTE — Discharge Instructions (Signed)
Take steroids for the next 5 days, use albuterol inhaler or nebulizer every 4-6 hours for the next 24 hours and then as needed.  Return for new or worsening symptoms.

## 2020-12-06 NOTE — ED Triage Notes (Signed)
Pt reports asthma exacerbation over the past week. He has run out of his albuterol inhaler.

## 2021-01-02 ENCOUNTER — Emergency Department (HOSPITAL_COMMUNITY)
Admission: EM | Admit: 2021-01-02 | Discharge: 2021-01-02 | Disposition: A | Discharge status: Home / Self Care | Payer: Self-pay | Attending: Emergency Medicine | Admitting: Emergency Medicine

## 2021-01-02 ENCOUNTER — Other Ambulatory Visit: Payer: Self-pay

## 2021-01-02 ENCOUNTER — Encounter (HOSPITAL_COMMUNITY): Payer: Self-pay | Admitting: Emergency Medicine

## 2021-01-02 DIAGNOSIS — Z87891 Personal history of nicotine dependence: Secondary | ICD-10-CM | POA: Insufficient documentation

## 2021-01-02 DIAGNOSIS — J4521 Mild intermittent asthma with (acute) exacerbation: Secondary | ICD-10-CM | POA: Insufficient documentation

## 2021-01-02 DIAGNOSIS — Z7952 Long term (current) use of systemic steroids: Secondary | ICD-10-CM | POA: Insufficient documentation

## 2021-01-02 MED ORDER — ALBUTEROL SULFATE (2.5 MG/3ML) 0.083% IN NEBU: 2.5000 mg | INHALATION_SOLUTION | RESPIRATORY_TRACT | 0 refills | Status: DC | PRN

## 2021-01-02 MED ORDER — ALBUTEROL SULFATE HFA 108 (90 BASE) MCG/ACT IN AERS: 2.0000 | INHALATION_SPRAY | Freq: Four times a day (QID) | RESPIRATORY_TRACT | 2 refills | Status: DC | PRN

## 2021-01-02 MED ORDER — DEXAMETHASONE SODIUM PHOSPHATE 10 MG/ML IJ SOLN
10.0000 mg | Freq: Once | INTRAMUSCULAR | Status: AC
  Administered 2021-01-02: 20:00:00 10 mg via INTRAMUSCULAR
  Filled 2021-01-02: qty 1

## 2021-01-02 MED ORDER — ALBUTEROL SULFATE HFA 108 (90 BASE) MCG/ACT IN AERS
1.0000 | INHALATION_SPRAY | Freq: Once | RESPIRATORY_TRACT | Status: AC
  Administered 2021-01-02: 20:00:00 2 via RESPIRATORY_TRACT
  Filled 2021-01-02: qty 6.7

## 2021-01-02 NOTE — Discharge Instructions (Signed)
Use inhaler to help with your symptoms. Follow-up with your primary care provider. Return to the ER if you start to experience worsening chest pain, shortness of breath, wheezing

## 2021-01-02 NOTE — ED Triage Notes (Signed)
Patient arrives complaining of asthma, hx of same, stating his albuterol inhaler isn't helping. Patient states after doing yard work over the last few days, the grass and pollen have "set off" his asthma. Patient noted to be minimally distressed, but can speak in full sentences.

## 2021-01-02 NOTE — ED Provider Notes (Signed)
[facility]-EMERGENCY DEPT Provider Note   CSN: [id number] Arrival date & time: 01/02/21  1902     History Chief Complaint  Patient presents with   Asthma    Jesus Haynes is a 45 y.o. male with a past medical history of asthma presenting to the ED for shortness of breath and wheezing.  Symptoms have been going on since this morning.  States that he was doing yard work and he is concerned that this exacerbated his asthma.  He states that his albuterol inhaler is not helping.  He states that typically the blue inhalers do not help with the yellow ones do.  He denies any chest pain, leg swelling, fever.  HPI     Past Medical History:  Diagnosis Date   Asthma     There are no problems to display for this patient.   History reviewed. No pertinent surgical history.     Family History  Problem Relation Age of Onset   Heart failure Father     Social History   Tobacco Use   Smoking status: Former    Packs/day: 0.50    Pack years: 0.00    Types: Cigarettes    Quit date: 12/26/2012    Years since quitting: 8.0   Smokeless tobacco: Never  Vaping Use   Vaping Use: Former   Quit date: 08/21/2018   Substances: Nicotine  Substance Use Topics   Alcohol use: No   Drug use: No    Home Medications Prior to Admission medications   Medication Sig Start Date End Date Taking? Authorizing Provider  albuterol (VENTOLIN HFA) 108 (90 Base) MCG/ACT inhaler Inhale 2 puffs into the lungs every 6 (six) hours as needed for wheezing or shortness of breath. 01/02/21  Yes Dawnmarie Breon, PA-C  albuterol (PROVENTIL) (2.5 MG/3ML) 0.083% nebulizer solution Take 3 mLs (2.5 mg total) by nebulization every 4 (four) hours as needed for wheezing or shortness of breath. 01/02/21   Candela Krul, PA-C  lidocaine (LIDODERM) 5 % Place 1 patch onto the skin daily. Remove & Discard patch within 12 hours or as directed by MD 10/22/20   Sponseller, Lupe Carney R, PA-C  lidocaine (XYLOCAINE) 2 %  solution Use as directed 15 mLs in the mouth or throat every 3 (three) hours as needed for mouth pain. 07/26/20   McDonald, Mia A, PA-C  methocarbamol (ROBAXIN) 500 MG tablet Take 1 tablet (500 mg total) by mouth 2 (two) times daily. 10/22/20   Sponseller, Eugene Gavia, PA-C  predniSONE (DELTASONE) 20 MG tablet Take 2 tablets (40 mg total) by mouth daily. 12/06/20   Dartha Lodge, PA-C  cetirizine (ZYRTEC) 10 MG tablet Take 1 tablet (10 mg total) by mouth daily. Patient not taking: Reported on 01/26/2019 02/23/18 07/26/20  Maczis, Elmer Sow, PA-C  fluticasone [facility]) 50 MCG/ACT nasal spray Place 1-2 sprays into both nostrils daily. Patient not taking: Reported on 01/26/2019 02/23/18 07/26/20  Jacinto Halim, PA-C    Allergies    Patient has no known allergies.  Review of Systems   Review of Systems  Constitutional:  Negative for chills and fever.  Respiratory:  Positive for shortness of breath and wheezing.   Cardiovascular:  Negative for chest pain.   Physical Exam Updated Vital Signs BP (!) 155/97 (BP Location: Right Arm)   Pulse 79   Temp 97.8 F (36.6 C) (Oral)   Resp 20   SpO2 98%   Physical Exam Vitals and nursing note reviewed.  Constitutional:  General: He is not in acute distress.    Appearance: He is well-developed. He is not diaphoretic.  HENT:     Head: Normocephalic and atraumatic.  Eyes:     General: No scleral icterus.    Conjunctiva/sclera: Conjunctivae normal.  Cardiovascular:     Rate and Rhythm: Normal rate and regular rhythm.  Pulmonary:     Effort: Pulmonary effort is normal. No respiratory distress.     Breath sounds: Wheezing (Bilateral lower lung fields) present.  Musculoskeletal:     Cervical back: Normal range of motion.  Skin:    Findings: No rash.  Neurological:     Mental Status: He is alert.    ED Results / Procedures / Treatments   Labs (all labs ordered are listed, but only abnormal results are displayed) Labs Reviewed - No data to  display  EKG None  Radiology No results found.  Procedures Procedures   Medications Ordered in ED Medications  albuterol (VENTOLIN HFA) 108 (90 Base) MCG/ACT inhaler 1-2 puff (2 puffs Inhalation Given 01/02/21 1930)  dexamethasone (DECADRON) injection 10 mg (10 mg Intramuscular Given 01/02/21 1930)    ED Course  I have reviewed the triage vital signs and the nursing notes.  Pertinent labs & imaging results that were available during my care of the patient were reviewed by me and considered in my medical decision making (see chart for details).    MDM Rules/Calculators/A&P                          45 year old male presenting to the ED for asthma exacerbation.  He has some mild wheezing noted on bilateral lower lung fields.  His oxygen saturations are 98% on room air and he has no signs of respiratory distress.  He is afebrile here.  I suspect this is due to inadequate inhaler use.  Will give refill of inhaler here as well as Decadron and provide him with prescription refills for inhaler and nebulizer solution.  Patient is agreeable to the plan.  Return precautions given.   Patient is hemodynamically stable, in NAD, and able to ambulate in the ED. Evaluation does not show pathology that would require ongoing emergent intervention or inpatient treatment. I explained the diagnosis to the patient. Pain has been managed and has no complaints prior to discharge. Patient is comfortable with above plan and is stable for discharge at this time. All questions were answered prior to disposition. Strict return precautions for returning to the ED were discussed. Encouraged follow up with PCP.   An After Visit Summary was printed and given to the patient.   Portions of this note were generated with Scientist, clinical (histocompatibility and immunogenetics). Dictation errors may occur despite best attempts at proofreading.  Final Clinical Impression(s) / ED Diagnoses Final diagnoses:  Mild intermittent asthma with exacerbation     Rx / DC Orders ED Discharge Orders          Ordered    albuterol (VENTOLIN HFA) 108 (90 Base) MCG/ACT inhaler  Every 6 hours PRN        01/02/21 1932    albuterol (PROVENTIL) (2.5 MG/3ML) 0.083% nebulizer solution  Every 4 hours PRN        01/02/21 1932             Dietrich Pates, PA-C 01/02/21 1942    Terrilee Files, MD 01/03/21 1145

## 2021-03-08 ENCOUNTER — Encounter (HOSPITAL_COMMUNITY): Payer: Self-pay

## 2021-03-08 ENCOUNTER — Other Ambulatory Visit: Payer: Self-pay

## 2021-03-08 ENCOUNTER — Emergency Department (HOSPITAL_COMMUNITY)
Admission: EM | Admit: 2021-03-08 | Discharge: 2021-03-08 | Disposition: A | Discharge status: Home / Self Care | Payer: Self-pay | Attending: Emergency Medicine | Admitting: Emergency Medicine

## 2021-03-08 DIAGNOSIS — Z87891 Personal history of nicotine dependence: Secondary | ICD-10-CM | POA: Insufficient documentation

## 2021-03-08 DIAGNOSIS — J45901 Unspecified asthma with (acute) exacerbation: Secondary | ICD-10-CM | POA: Insufficient documentation

## 2021-03-08 MED ORDER — ALBUTEROL SULFATE HFA 108 (90 BASE) MCG/ACT IN AERS
2.0000 | INHALATION_SPRAY | Freq: Once | RESPIRATORY_TRACT | Status: DC
  Filled 2021-03-08: qty 6.7

## 2021-03-08 MED ORDER — ALBUTEROL SULFATE HFA 108 (90 BASE) MCG/ACT IN AERS
4.0000 | INHALATION_SPRAY | Freq: Once | RESPIRATORY_TRACT | Status: AC
  Administered 2021-03-08: 4 via RESPIRATORY_TRACT

## 2021-03-08 MED ORDER — ALBUTEROL SULFATE (2.5 MG/3ML) 0.083% IN NEBU: 2.5000 mg | INHALATION_SOLUTION | RESPIRATORY_TRACT | 0 refills | Status: DC | PRN

## 2021-03-08 NOTE — ED Triage Notes (Signed)
Pt reports asthma exacerbation over the past few days. Pt reports running out of his albuterol inhaler a couple days ago.

## 2021-03-08 NOTE — Discharge Instructions (Addendum)
You came to the emergency department today to have your wheezing and shortness of breath evaluated.  Your wheezing improved after receiving albuterol inhaler.  I have given you a refill of your albuterol nebulizer solution.  Please follow-up with [place] [facility] for further management of your asthma.  Get help right away if: You are getting worse and do not respond to treatment during an asthma attack. You are short of breath when at rest or when doing very little physical activity. You have difficulty eating, drinking, or talking. You have chest pain or tightness. You develop a fast heartbeat or palpitations. You have a bluish color to your lips or fingernails. You are light-headed or dizzy, or you faint. Your peak flow reading is less than 50% of your personal best. You feel too tired to breathe normally.

## 2021-03-08 NOTE — ED Provider Notes (Signed)
Emergency Medicine Provider Triage Evaluation Note  Jesus Haynes , a 45 y.o. male  was evaluated in triage.  Pt complains of asthma exacerbation..  Reports has had wheezing intermittently over the last few days.  Patient reports that he ran out of his albuterol inhaler a few days prior.  Review of Systems  Positive: Wheezing Negative: Rhinorrhea, nasal congestion, cough, fevers, chills  Physical Exam  BP (!) 152/94 (BP Location: Left Arm)   Pulse 78   Temp 98.2 F (36.8 C) (Oral)   Resp 18   SpO2 97%  Gen:   Awake, no distress   Resp:  Normal effort, expiratory wheezing noted to all lung fields. MSK:   Moves extremities without difficulty  Other:    Medical Decision Making  Medically screening exam initiated at 3:22 PM.  Appropriate orders placed.  Jesus Haynes was informed that the remainder of the evaluation will be completed by another provider, this initial triage assessment does not replace that evaluation, and the importance of remaining in the ED until their evaluation is complete.     Haskel Schroeder, PA-C 03/08/21 1523    Arby Barrette, MD 03/09/21 [phone]

## 2021-03-08 NOTE — ED Provider Notes (Signed)
Jesus Haynes Provider Note   CSN: [id number] Arrival date & time: 03/08/21  1429     History Chief Complaint  Patient presents with   Asthma    Jesus Haynes is a 45 y.o. male with a history of asthma.  Patient reports the emergency room with a chief complaint of wheezing.  Patient reports that his symptoms have been present over the last 2 to 3 days.  Patient states symptoms have been constant over this time.  Denies any aggravating factors.  Denies any alleviating factors.  Patient reports that he has run out of his albuterol inhaler.  Patient denies any rhinorrhea, nasal congestion, cough, sore throat, fevers, chills, chest pain, leg swelling or tenderness.     Asthma Associated symptoms include shortness of breath. Pertinent negatives include no chest pain, no abdominal pain and no headaches.      Past Medical History:  Diagnosis Date   Asthma     There are no problems to display for this patient.   History reviewed. No pertinent surgical history.     Family History  Problem Relation Age of Onset   Heart failure Father     Social History   Tobacco Use   Smoking status: Former    Packs/day: 0.50    Types: Cigarettes    Quit date: 12/26/2012    Years since quitting: 8.2   Smokeless tobacco: Never  Vaping Use   Vaping Use: Former   Quit date: 08/21/2018   Substances: Nicotine  Substance Use Topics   Alcohol use: No   Drug use: No    Home Medications Prior to Admission medications   Medication Sig Start Date End Date Taking? Authorizing Provider  albuterol (PROVENTIL) (2.5 MG/3ML) 0.083% nebulizer solution Take 3 mLs (2.5 mg total) by nebulization every 4 (four) hours as needed for wheezing or shortness of breath. 01/02/21   Khatri, Hina, PA-C  albuterol (VENTOLIN HFA) 108 (90 Base) MCG/ACT inhaler Inhale 2 puffs into the lungs every 6 (six) hours as needed for wheezing or shortness of breath. 01/02/21   Khatri, Hina, PA-C   lidocaine (LIDODERM) 5 % Place 1 patch onto the skin daily. Remove & Discard patch within 12 hours or as directed by MD 10/22/20   Sponseller, Lupe Carney R, PA-C  lidocaine (XYLOCAINE) 2 % solution Use as directed 15 mLs in the mouth or throat every 3 (three) hours as needed for mouth pain. 07/26/20   McDonald, Mia A, PA-C  methocarbamol (ROBAXIN) 500 MG tablet Take 1 tablet (500 mg total) by mouth 2 (two) times daily. 10/22/20   Sponseller, Eugene Gavia, PA-C  predniSONE (DELTASONE) 20 MG tablet Take 2 tablets (40 mg total) by mouth daily. 12/06/20   Dartha Lodge, PA-C  cetirizine (ZYRTEC) 10 MG tablet Take 1 tablet (10 mg total) by mouth daily. Patient not taking: Reported on 01/26/2019 02/23/18 07/26/20  Maczis, Elmer Sow, PA-C  fluticasone [facility]) 50 MCG/ACT nasal spray Place 1-2 sprays into both nostrils daily. Patient not taking: Reported on 01/26/2019 02/23/18 07/26/20  Jacinto Halim, PA-C    Allergies    Patient has no known allergies.  Review of Systems   Review of Systems  Constitutional:  Negative for chills and fever.  HENT:  Negative for congestion, rhinorrhea and sore throat.   Eyes:  Negative for visual disturbance.  Respiratory:  Positive for shortness of breath and wheezing. Negative for cough.   Cardiovascular:  Negative for chest pain and leg swelling.  Gastrointestinal:  Negative for abdominal pain, nausea and vomiting.  Musculoskeletal:  Negative for back pain and neck pain.  Skin:  Negative for color change and rash.  Allergic/Immunologic: Negative for immunocompromised state.  Neurological:  Negative for dizziness, syncope, light-headedness and headaches.  Psychiatric/Behavioral:  Negative for confusion.    Physical Exam Updated Vital Signs BP (!) 152/94 (BP Location: Left Arm)   Pulse 78   Temp 98.2 F (36.8 C) (Oral)   Resp 18   SpO2 97%   Physical Exam Vitals and nursing note reviewed.  Constitutional:      General: He is not in acute distress.     Appearance: He is not ill-appearing, toxic-appearing or diaphoretic.  HENT:     Head: Normocephalic.  Eyes:     General: No scleral icterus.       Right eye: No discharge.        Left eye: No discharge.  Cardiovascular:     Rate and Rhythm: Normal rate.  Pulmonary:     Effort: Pulmonary effort is normal. No tachypnea, bradypnea or respiratory distress.     Breath sounds: Normal breath sounds. No stridor. No wheezing.     Comments: Patient able speak in full complete sentences without difficulty.  Lungs clear to auscultation in all lung fields. Musculoskeletal:     Comments: No swelling or tenderness to bilateral lower extremities.  Skin:    General: Skin is warm and dry.  Neurological:     General: No focal deficit present.     Mental Status: He is alert and oriented to person, place, and time.     GCS: GCS eye subscore is 4. GCS verbal subscore is 5. GCS motor subscore is 6.  Psychiatric:        Behavior: Behavior is cooperative.    ED Results / Procedures / Treatments   Labs (all labs ordered are listed, but only abnormal results are displayed) Labs Reviewed - No data to display  EKG None  Radiology No results found.  Procedures Procedures   Medications Ordered in ED Medications  albuterol (VENTOLIN HFA) 108 (90 Base) MCG/ACT inhaler 4 puff (4 puffs Inhalation Given 03/08/21 1525)    ED Course  I have reviewed the triage vital signs and the nursing notes.  Pertinent labs & imaging results that were available during my care of the patient were reviewed by me and considered in my medical decision making (see chart for details).    MDM Rules/Calculators/A&P                           Alert 45 year old male no acute stress, nontoxic-appearing.  Presents emergency department with a chief complaint of wheezing and shortness of breath.  Patient reports that his symptoms have been constant over the last 2 to 3 days.  Patient has history of asthma.  Reports he has been out  of his albuterol inhaler.  Low suspicion for PE as patient can be ruled out via [facility] criteria.  Suspect patient's symptoms are due to asthma exacerbation.  On initial exam patient noted to have expiratory wheezing to all lung fields.  Patient was given albuterol inhaler.  On reevaluation patient lungs clear to auscultation in all lung fields.  Patient will leave with albuterol inhaler, will give patient refill of his albuterol nebulizer treatment.  Patient given information to follow-up with [place] [facility] for continued management of his asthma.  Discussed results, findings, treatment and  follow up. Patient advised of return precautions. Patient verbalized understanding and agreed with plan.   Final Clinical Impression(s) / ED Diagnoses Final diagnoses:  Exacerbation of asthma, unspecified asthma severity, unspecified whether persistent    Rx / DC Orders ED Discharge Orders          Ordered    albuterol (PROVENTIL) (2.5 MG/3ML) 0.083% nebulizer solution  Every 4 hours PRN        03/08/21 1609             Haskel Schroeder, PA-C 03/08/21 1610    Arby Barrette, MD 03/09/21 1612

## 2021-05-15 ENCOUNTER — Other Ambulatory Visit: Payer: Self-pay

## 2021-05-15 ENCOUNTER — Emergency Department (HOSPITAL_COMMUNITY)
Admission: EM | Admit: 2021-05-15 | Discharge: 2021-05-15 | Disposition: A | Discharge status: Home / Self Care | Payer: Self-pay | Attending: Emergency Medicine | Admitting: Emergency Medicine

## 2021-05-15 ENCOUNTER — Encounter (HOSPITAL_COMMUNITY): Payer: Self-pay

## 2021-05-15 DIAGNOSIS — J4521 Mild intermittent asthma with (acute) exacerbation: Secondary | ICD-10-CM

## 2021-05-15 DIAGNOSIS — Z87891 Personal history of nicotine dependence: Secondary | ICD-10-CM | POA: Insufficient documentation

## 2021-05-15 DIAGNOSIS — R0602 Shortness of breath: Secondary | ICD-10-CM | POA: Insufficient documentation

## 2021-05-15 DIAGNOSIS — J45909 Unspecified asthma, uncomplicated: Secondary | ICD-10-CM | POA: Insufficient documentation

## 2021-05-15 MED ORDER — ALBUTEROL SULFATE (2.5 MG/3ML) 0.083% IN NEBU: 2.5000 mg | INHALATION_SOLUTION | RESPIRATORY_TRACT | 0 refills | Status: DC | PRN

## 2021-05-15 MED ORDER — ALBUTEROL SULFATE HFA 108 (90 BASE) MCG/ACT IN AERS
4.0000 | INHALATION_SPRAY | Freq: Once | RESPIRATORY_TRACT | Status: AC
  Administered 2021-05-15: 4 via RESPIRATORY_TRACT
  Filled 2021-05-15: qty 6.7

## 2021-05-15 MED ORDER — ALBUTEROL SULFATE HFA 108 (90 BASE) MCG/ACT IN AERS: 2.0000 | INHALATION_SPRAY | Freq: Four times a day (QID) | RESPIRATORY_TRACT | 0 refills | Status: DC | PRN

## 2021-05-15 NOTE — ED Provider Notes (Signed)
[facility]-EMERGENCY DEPT Provider Note   CSN: [id number] Arrival date & time: 05/15/21  1201     History Chief Complaint  Patient presents with   Asthma    HANK WALLING is a 45 y.o. male.  45 year old male presents today for evaluation of wheezing and shortness of breath of 2-day duration.  Patient reports 3 days ago he ran out of his albuterol inhaler and does not have additional refills.  Patient denies chest pain, palpitations, lightheadedness, fever, or other URI symptoms.  He reports this is typical of his asthma exacerbation.  He is without other is at this time.  The history is provided by the patient. No language interpreter was used.      Past Medical History:  Diagnosis Date   Asthma     There are no problems to display for this patient.   History reviewed. No pertinent surgical history.     Family History  Problem Relation Age of Onset   Heart failure Father     Social History   Tobacco Use   Smoking status: Former    Packs/day: 0.50    Types: Cigarettes    Quit date: 12/26/2012    Years since quitting: 8.3   Smokeless tobacco: Never  Vaping Use   Vaping Use: Former   Quit date: 08/21/2018   Substances: Nicotine  Substance Use Topics   Alcohol use: No   Drug use: No    Home Medications Prior to Admission medications   Medication Sig Start Date End Date Taking? Authorizing Provider  albuterol (PROVENTIL) (2.5 MG/3ML) 0.083% nebulizer solution Take 3 mLs (2.5 mg total) by nebulization every 4 (four) hours as needed for wheezing or shortness of breath. 03/08/21   Haskel Schroeder, PA-C  albuterol (VENTOLIN HFA) 108 (90 Base) MCG/ACT inhaler Inhale 2 puffs into the lungs every 6 (six) hours as needed for wheezing or shortness of breath. 01/02/21   Khatri, Hina, PA-C  lidocaine (LIDODERM) 5 % Place 1 patch onto the skin daily. Remove & Discard patch within 12 hours or as directed by MD 10/22/20   Sponseller, Lupe Carney R, PA-C   lidocaine (XYLOCAINE) 2 % solution Use as directed 15 mLs in the mouth or throat every 3 (three) hours as needed for mouth pain. 07/26/20   McDonald, Mia A, PA-C  methocarbamol (ROBAXIN) 500 MG tablet Take 1 tablet (500 mg total) by mouth 2 (two) times daily. 10/22/20   Sponseller, Eugene Gavia, PA-C  predniSONE (DELTASONE) 20 MG tablet Take 2 tablets (40 mg total) by mouth daily. 12/06/20   Dartha Lodge, PA-C  cetirizine (ZYRTEC) 10 MG tablet Take 1 tablet (10 mg total) by mouth daily. Patient not taking: Reported on 01/26/2019 02/23/18 07/26/20  Maczis, Elmer Sow, PA-C  fluticasone [facility]) 50 MCG/ACT nasal spray Place 1-2 sprays into both nostrils daily. Patient not taking: Reported on 01/26/2019 02/23/18 07/26/20  Jacinto Halim, PA-C    Allergies    Patient has no known allergies.  Review of Systems   Review of Systems  Constitutional:  Negative for appetite change, chills and fever.  HENT:  Negative for congestion and sore throat.   Respiratory:  Positive for shortness of breath. Negative for cough.   Cardiovascular:  Negative for chest pain and palpitations.  All other systems reviewed and are negative.  Physical Exam Updated Vital Signs BP 134/87 (BP Location: Left Arm)   Pulse 82   Temp 97.8 F (36.6 C) (Oral)   Resp 18  Ht 5\' 6"  (1.676 m)   Wt 95.3 kg   SpO2 96%   BMI 33.89 kg/m   Physical Exam Vitals and nursing note reviewed.  Constitutional:      General: He is not in acute distress.    Appearance: Normal appearance. He is not ill-appearing.  HENT:     Head: Normocephalic and atraumatic.     Nose: Nose normal.  Eyes:     General: No scleral icterus.    Extraocular Movements: Extraocular movements intact.     Conjunctiva/sclera: Conjunctivae normal.  Cardiovascular:     Rate and Rhythm: Normal rate and regular rhythm.     Pulses: Normal pulses.     Heart sounds: Normal heart sounds.  Pulmonary:     Effort: Pulmonary effort is normal. No respiratory distress.      Breath sounds: Wheezing present. No rales.  Abdominal:     General: There is no distension.     Tenderness: There is no abdominal tenderness.  Musculoskeletal:        General: Normal range of motion.     Cervical back: Normal range of motion.  Skin:    General: Skin is warm and dry.  Neurological:     General: No focal deficit present.     Mental Status: He is alert. Mental status is at baseline.    ED Results / Procedures / Treatments   Labs (all labs ordered are listed, but only abnormal results are displayed) Labs Reviewed - No data to display  EKG None  Radiology No results found.  Procedures Procedures   Medications Ordered in ED Medications  albuterol (VENTOLIN HFA) 108 (90 Base) MCG/ACT inhaler 4 puff (4 puffs Inhalation Given 05/15/21 1237)    ED Course  I have reviewed the triage vital signs and the nursing notes.  Pertinent labs & imaging results that were available during my care of the patient were reviewed by me and considered in my medical decision making (see chart for details).    MDM Rules/Calculators/A&P                           45 year old male presents today for evaluation of shortness of breath and wheezing of 2-day duration.  Patient reports he ran out of his albuterol inhaler 3 days ago.  He does not have additional refills.  Patient on initial exam he did have wheezes auscultation.  Patient was given 4 puffs of albuterol inhaler.  Patient following the treatment had improvement in his symptoms.  On auscultation following the treatment patient has minimal expiratory wheezes.  Patient is appropriate for discharge.  We will send in a refill for his albuterol inhaler and DuoNeb.  Patient voices understanding and is in agreement with plan.  Final Clinical Impression(s) / ED Diagnoses Final diagnoses:  None    Rx / DC Orders ED Discharge Orders     None        54, PA-C 05/15/21 1335    05/17/21, MD 05/15/21 1640

## 2021-05-15 NOTE — Discharge Instructions (Signed)
He had an asthma received 4 puffs of albuterol inhaler in the emergency room with improvement in her asthma exacerbation.  I have sent 10 albuterol and nebulizer treatment to your pharmacy as well.  If your symptoms worsen please return to the emergency room.  I have attached a resource sheet for you to call and set up a primary care provider.

## 2021-05-15 NOTE — ED Triage Notes (Signed)
Pt states he has asthma and its been "acting up today with the weather changes". Pt has no auditory wheezes. Pt denies SOB. Pt states he is out of his asthma meds and has no inhaler. Pt states he needs a refill on meds.

## 2021-06-15 ENCOUNTER — Other Ambulatory Visit: Payer: Self-pay

## 2021-06-15 ENCOUNTER — Encounter (HOSPITAL_COMMUNITY): Payer: Self-pay | Admitting: Emergency Medicine

## 2021-06-15 ENCOUNTER — Emergency Department (HOSPITAL_COMMUNITY)
Admission: EM | Admit: 2021-06-15 | Discharge: 2021-06-15 | Disposition: A | Discharge status: Home / Self Care | Payer: Self-pay | Attending: Emergency Medicine | Admitting: Emergency Medicine

## 2021-06-15 DIAGNOSIS — Z87891 Personal history of nicotine dependence: Secondary | ICD-10-CM | POA: Insufficient documentation

## 2021-06-15 DIAGNOSIS — Z760 Encounter for issue of repeat prescription: Secondary | ICD-10-CM | POA: Insufficient documentation

## 2021-06-15 DIAGNOSIS — J45901 Unspecified asthma with (acute) exacerbation: Secondary | ICD-10-CM | POA: Insufficient documentation

## 2021-06-15 MED ORDER — PREDNISONE 20 MG PO TABS
20.0000 mg | ORAL_TABLET | Freq: Once | ORAL | Status: AC
  Administered 2021-06-15: 20 mg via ORAL
  Filled 2021-06-15: qty 1

## 2021-06-15 MED ORDER — PREDNISONE 10 MG PO TABS: 20.0000 mg | ORAL_TABLET | Freq: Every day | ORAL | 0 refills | Status: DC

## 2021-06-15 MED ORDER — ALBUTEROL SULFATE (5 MG/ML) 0.5% IN NEBU: 2.5000 mg | INHALATION_SOLUTION | Freq: Four times a day (QID) | RESPIRATORY_TRACT | 12 refills | Status: DC | PRN

## 2021-06-15 MED ORDER — ALBUTEROL SULFATE HFA 108 (90 BASE) MCG/ACT IN AERS: 1.0000 | INHALATION_SPRAY | Freq: Four times a day (QID) | RESPIRATORY_TRACT | 1 refills | Status: DC | PRN

## 2021-06-15 MED ORDER — ALBUTEROL SULFATE HFA 108 (90 BASE) MCG/ACT IN AERS
1.0000 | INHALATION_SPRAY | Freq: Once | RESPIRATORY_TRACT | Status: AC
  Administered 2021-06-15: 1 via RESPIRATORY_TRACT
  Filled 2021-06-15: qty 6.7

## 2021-06-15 NOTE — Discharge Instructions (Signed)
Return for any problem.  ?

## 2021-06-15 NOTE — ED Provider Notes (Signed)
[facility]-EMERGENCY DEPT Provider Note   CSN: [id number] Arrival date & time: 06/15/21  1012     History Chief Complaint  Patient presents with   Asthma    Jesus Haynes is a 45 y.o. male.  45 year old male with prior medical history as detailed below presents for evaluation.  Patient reports onset history of asthma.  Patient reports that he he ran out of his albuterol inhaler several days ago.  He is requesting refill.  He is also requesting a short course of prednisone to treat his wheezing.  He is speaking in full sentences.  He denies recent fever.  He denies other complaint.  The history is provided by the patient.  Asthma This is a chronic problem. The current episode started more than 1 week ago. The problem occurs daily. The problem has not changed since onset.Pertinent negatives include no chest pain and no abdominal pain. Nothing aggravates the symptoms. Nothing relieves the symptoms.      Past Medical History:  Diagnosis Date   Asthma     There are no problems to display for this patient.   History reviewed. No pertinent surgical history.     Family History  Problem Relation Age of Onset   Heart failure Father     Social History   Tobacco Use   Smoking status: Former    Packs/day: 0.50    Types: Cigarettes    Quit date: 12/26/2012    Years since quitting: 8.4   Smokeless tobacco: Never  Vaping Use   Vaping Use: Former   Quit date: 08/21/2018   Substances: Nicotine  Substance Use Topics   Alcohol use: No   Drug use: No    Home Medications Prior to Admission medications   Medication Sig Start Date End Date Taking? Authorizing Provider  albuterol (PROVENTIL) (5 MG/ML) 0.5% nebulizer solution Take 0.5 mLs (2.5 mg total) by nebulization every 6 (six) hours as needed for wheezing or shortness of breath. 06/15/21  Yes Wynetta Fines, MD  albuterol (VENTOLIN HFA) 108 (90 Base) MCG/ACT inhaler Inhale 1-2 puffs into the lungs  every 6 (six) hours as needed for wheezing or shortness of breath. 06/15/21  Yes Wynetta Fines, MD  predniSONE (DELTASONE) 10 MG tablet Take 2 tablets (20 mg total) by mouth daily. 06/15/21  Yes Wynetta Fines, MD  albuterol (PROVENTIL) (2.5 MG/3ML) 0.083% nebulizer solution Take 3 mLs (2.5 mg total) by nebulization every 4 (four) hours as needed for wheezing or shortness of breath. 05/15/21   Karie Mainland, Amjad, PA-C  albuterol (VENTOLIN HFA) 108 (90 Base) MCG/ACT inhaler Inhale 2 puffs into the lungs every 6 (six) hours as needed for wheezing or shortness of breath. 05/15/21   Karie Mainland, Amjad, PA-C  lidocaine (LIDODERM) 5 % Place 1 patch onto the skin daily. Remove & Discard patch within 12 hours or as directed by MD 10/22/20   Sponseller, Lupe Carney R, PA-C  lidocaine (XYLOCAINE) 2 % solution Use as directed 15 mLs in the mouth or throat every 3 (three) hours as needed for mouth pain. 07/26/20   McDonald, Mia A, PA-C  methocarbamol (ROBAXIN) 500 MG tablet Take 1 tablet (500 mg total) by mouth 2 (two) times daily. 10/22/20   Sponseller, Eugene Gavia, PA-C  predniSONE (DELTASONE) 20 MG tablet Take 2 tablets (40 mg total) by mouth daily. 12/06/20   Dartha Lodge, PA-C  cetirizine (ZYRTEC) 10 MG tablet Take 1 tablet (10 mg total) by mouth daily. Patient not taking: Reported  on 01/26/2019 02/23/18 07/26/20  Jacinto Halim, PA-C  fluticasone (FLONASE) 50 MCG/ACT nasal spray Place 1-2 sprays into both nostrils daily. Patient not taking: Reported on 01/26/2019 02/23/18 07/26/20  Jacinto Halim, PA-C    Allergies    Patient has no known allergies.  Review of Systems   Review of Systems  Cardiovascular:  Negative for chest pain.  Gastrointestinal:  Negative for abdominal pain.  All other systems reviewed and are negative.  Physical Exam Updated Vital Signs BP (!) 141/92 (BP Location: Left Arm)    Pulse 85    Temp 98 F (36.7 C) (Oral)    Resp 20    SpO2 95%   Physical Exam Vitals and nursing note reviewed.   Constitutional:      General: He is not in acute distress.    Appearance: Normal appearance. He is well-developed.  HENT:     Head: Normocephalic and atraumatic.  Eyes:     Conjunctiva/sclera: Conjunctivae normal.     Pupils: Pupils are equal, round, and reactive to light.  Cardiovascular:     Rate and Rhythm: Normal rate and regular rhythm.     Heart sounds: Normal heart sounds.  Pulmonary:     Effort: Pulmonary effort is normal. No respiratory distress.     Comments: Mild diffuse scattered expiratory wheezes. Abdominal:     General: There is no distension.     Palpations: Abdomen is soft.     Tenderness: There is no abdominal tenderness.  Musculoskeletal:        General: No deformity. Normal range of motion.     Cervical back: Normal range of motion and neck supple.  Skin:    General: Skin is warm and dry.  Neurological:     General: No focal deficit present.     Mental Status: He is alert and oriented to person, place, and time.    ED Results / Procedures / Treatments   Labs (all labs ordered are listed, but only abnormal results are displayed) Labs Reviewed - No data to display  EKG None  Radiology No results found.  Procedures Procedures   Medications Ordered in ED Medications  predniSONE (DELTASONE) tablet 20 mg (has no administration in time range)  albuterol (VENTOLIN HFA) 108 (90 Base) MCG/ACT inhaler 1-2 puff (has no administration in time range)    ED Course  I have reviewed the triage vital signs and the nursing notes.  Pertinent labs & imaging results that were available during my care of the patient were reviewed by me and considered in my medical decision making (see chart for details).    MDM Rules/Calculators/A&P                           MDM  MSE complete  Jesus Haynes was evaluated in Emergency Department on 06/15/2021 for the symptoms described in the history of present illness. He was evaluated in the context of the global  COVID-19 pandemic, which necessitated consideration that the patient might be at risk for infection with the SARS-CoV-2 virus that causes COVID-19. Institutional protocols and algorithms that pertain to the evaluation of patients at risk for COVID-19 are in a state of rapid change based on information released by regulatory bodies including the CDC and federal and state organizations. These policies and algorithms were followed during the patient's care in the ED.  Patient is presenting with complaint of increased wheezing.  He reports that he is out  of his albuterol.  He request refill on albuterol both as MDI and his Nebules. He would prefer to be given his refills and then be discharged.  He declines nebulization here in the ED.  Importance of close FU stressed.  Strict return precautions given and understood.  Final Clinical Impression(s) / ED Diagnoses Final diagnoses:  Mild asthma with exacerbation, unspecified whether persistent    Rx / DC Orders ED Discharge Orders          Ordered    albuterol (VENTOLIN HFA) 108 (90 Base) MCG/ACT inhaler  Every 6 hours PRN        06/15/21 1125    albuterol (PROVENTIL) (5 MG/ML) 0.5% nebulizer solution  Every 6 hours PRN        06/15/21 1125    predniSONE (DELTASONE) 10 MG tablet  Daily        06/15/21 1125             Wynetta Fines, MD 06/15/21 1130

## 2021-06-15 NOTE — ED Triage Notes (Signed)
Patient complains of SOB and nasal congestion, has been out of his inhaler for a few days. Denies cough. Denies N/V or CP. Using inhaler multiple times per day. Would like prednisone.

## 2021-08-11 ENCOUNTER — Emergency Department (HOSPITAL_COMMUNITY)
Admission: EM | Admit: 2021-08-11 | Discharge: 2021-08-11 | Disposition: A | Discharge status: Home / Self Care | Payer: Self-pay | Attending: Student | Admitting: Student

## 2021-08-11 ENCOUNTER — Encounter (HOSPITAL_COMMUNITY): Payer: Self-pay

## 2021-08-11 ENCOUNTER — Other Ambulatory Visit: Payer: Self-pay

## 2021-08-11 DIAGNOSIS — J45901 Unspecified asthma with (acute) exacerbation: Secondary | ICD-10-CM | POA: Insufficient documentation

## 2021-08-11 DIAGNOSIS — Z79899 Other long term (current) drug therapy: Secondary | ICD-10-CM | POA: Insufficient documentation

## 2021-08-11 MED ORDER — ALBUTEROL SULFATE HFA 108 (90 BASE) MCG/ACT IN AERS: 1.0000 | INHALATION_SPRAY | RESPIRATORY_TRACT | 3 refills | Status: DC | PRN

## 2021-08-11 MED ORDER — ALBUTEROL SULFATE HFA 108 (90 BASE) MCG/ACT IN AERS: 2.0000 | INHALATION_SPRAY | RESPIRATORY_TRACT | Status: DC | PRN

## 2021-08-11 MED ORDER — ALBUTEROL SULFATE HFA 108 (90 BASE) MCG/ACT IN AERS
2.0000 | INHALATION_SPRAY | RESPIRATORY_TRACT | Status: DC | PRN
  Administered 2021-08-11: 2 via RESPIRATORY_TRACT
  Filled 2021-08-11: qty 6.7

## 2021-08-11 NOTE — Discharge Instructions (Signed)
Please read and follow all provided instructions.  Your diagnoses today include:  1. Exacerbation of asthma, unspecified asthma severity, unspecified whether persistent     Tests performed today include: Vital signs. See below for your results today.   Medications prescribed:  Albuterol inhaler - medication that opens up your airway  Use inhaler as follows: 1-2 puffs with spacer every 4 hours as needed for wheezing, cough, or shortness of breath.   Take any prescribed medications only as directed.  Home care instructions:  Follow any educational materials contained in this packet.  Follow-up instructions: Please follow-up with your primary care provider in the next 3 days for further evaluation of your symptoms and management of your asthma.  Return instructions:  Please return to the Emergency Department if you experience worsening symptoms. Please return with worsening wheezing, shortness of breath, or difficulty breathing. Return with persistent fever above 101F.  Please return if you have any other emergent concerns.  Additional Information:  Your vital signs today were: BP (!) 150/90 (BP Location: Left Arm)    Pulse 83    Temp 97.9 F (36.6 C) (Oral)    Resp 20    Ht 5\' 7"  (1.702 m)    Wt 95.3 kg    SpO2 94%    BMI 32.89 kg/m  If your blood pressure (BP) was elevated above 135/85 this visit, please have this repeated by your doctor within one month. --------------

## 2021-08-11 NOTE — ED Triage Notes (Signed)
Patient states he has been having increased wheezing x 2-3 days. Patient states his Albuterol inhaler is empty.

## 2021-08-11 NOTE — ED Provider Notes (Addendum)
[facility]-EMERGENCY DEPT Provider Note   CSN: [id number] Arrival date & time: 08/11/21  1132     History  Chief Complaint  Patient presents with   Asthma   Medication Refill    Jesus Haynes is a 46 y.o. male.  Patient with history of asthma presents to the emergency department for evaluation of chest tightness and wheezing.  Patient states that he has had symptoms every day.  They have been worse over the past few days.  He does not know any specific triggers.  He has been using the last of his albuterol inhaler and is now out.  He does not have any refills.  Denies fevers, runny nose, sore throat or ear pain.  No vomiting.  Onset of symptoms acute per course is constant.      Home Medications Prior to Admission medications   Medication Sig Start Date End Date Taking? Authorizing Provider  albuterol (PROVENTIL) (2.5 MG/3ML) 0.083% nebulizer solution Take 3 mLs (2.5 mg total) by nebulization every 4 (four) hours as needed for wheezing or shortness of breath. 05/15/21   Karie Mainland, Amjad, PA-C  albuterol (PROVENTIL) (5 MG/ML) 0.5% nebulizer solution Take 0.5 mLs (2.5 mg total) by nebulization every 6 (six) hours as needed for wheezing or shortness of breath. 06/15/21   Wynetta Fines, MD  albuterol (VENTOLIN HFA) 108 (90 Base) MCG/ACT inhaler Inhale 2 puffs into the lungs every 6 (six) hours as needed for wheezing or shortness of breath. 05/15/21   Karie Mainland, Amjad, PA-C  albuterol (VENTOLIN HFA) 108 (90 Base) MCG/ACT inhaler Inhale 1-2 puffs into the lungs every 6 (six) hours as needed for wheezing or shortness of breath. 06/15/21   Wynetta Fines, MD  lidocaine (LIDODERM) 5 % Place 1 patch onto the skin daily. Remove & Discard patch within 12 hours or as directed by MD 10/22/20   Sponseller, Lupe Carney R, PA-C  lidocaine (XYLOCAINE) 2 % solution Use as directed 15 mLs in the mouth or throat every 3 (three) hours as needed for mouth pain. 07/26/20   McDonald, Mia A, PA-C   methocarbamol (ROBAXIN) 500 MG tablet Take 1 tablet (500 mg total) by mouth 2 (two) times daily. 10/22/20   Sponseller, Eugene Gavia, PA-C  predniSONE (DELTASONE) 10 MG tablet Take 2 tablets (20 mg total) by mouth daily. 06/15/21   Wynetta Fines, MD  predniSONE (DELTASONE) 20 MG tablet Take 2 tablets (40 mg total) by mouth daily. 12/06/20   Dartha Lodge, PA-C  cetirizine (ZYRTEC) 10 MG tablet Take 1 tablet (10 mg total) by mouth daily. Patient not taking: Reported on 01/26/2019 02/23/18 07/26/20  Maczis, Elmer Sow, PA-C  fluticasone [facility]) 50 MCG/ACT nasal spray Place 1-2 sprays into both nostrils daily. Patient not taking: Reported on 01/26/2019 02/23/18 07/26/20  Jacinto Halim, PA-C      Allergies    Patient has no known allergies.    Review of Systems   Review of Systems  Physical Exam Updated Vital Signs BP (!) 150/90 (BP Location: Left Arm)    Pulse 83    Temp 97.9 F (36.6 C) (Oral)    Resp 20    Ht 5\' 7"  (1.702 m)    Wt 95.3 kg    SpO2 94%    BMI 32.89 kg/m  Physical Exam Vitals and nursing note reviewed.  Constitutional:      General: He is not in acute distress.    Appearance: He is well-developed.  HENT:  Head: Normocephalic and atraumatic.     Right Ear: Tympanic membrane, ear canal and external ear normal.     Left Ear: Tympanic membrane, ear canal and external ear normal.     Nose: Nose normal. No congestion.     Mouth/Throat:     Mouth: Mucous membranes are moist.  Eyes:     General:        Right eye: No discharge.        Left eye: No discharge.     Conjunctiva/sclera: Conjunctivae normal.  Cardiovascular:     Rate and Rhythm: Normal rate and regular rhythm.     Heart sounds: Normal heart sounds.  Pulmonary:     Effort: Pulmonary effort is normal.     Breath sounds: Wheezing present.     Comments: Mild expiratory wheezing in all fields, good air movement.  No accessory muscle use. Abdominal:     Palpations: Abdomen is soft.     Tenderness: There is no  abdominal tenderness.  Musculoskeletal:     Cervical back: Normal range of motion and neck supple.  Skin:    General: Skin is warm and dry.  Neurological:     Mental Status: He is alert.    ED Results / Procedures / Treatments   Labs (all labs ordered are listed, but only abnormal results are displayed) Labs Reviewed - No data to display  EKG None  Radiology No results found.  Procedures Procedures    Medications Ordered in ED Medications  albuterol (VENTOLIN HFA) 108 (90 Base) MCG/ACT inhaler 2 puff (2 puffs Inhalation Given 08/11/21 1150)    ED Course/ Medical Decision Making/ A&P    Patient seen and examined. History obtained directly from patient.   Labs/EKG: None indicated  Imaging: Consider chest x-ray but not felt needed given lack of other infectious symptoms  Medications/Fluids: Ordered: Albuterol inhaler  Most recent vital signs reviewed and are as follows: BP (!) 150/90 (BP Location: Left Arm)    Pulse 83    Temp 97.9 F (36.6 C) (Oral)    Resp 20    Ht 5\' 7"  (1.702 m)    Wt 95.3 kg    SpO2 94%    BMI 32.89 kg/m   Initial impression: Asthma exacerbation, no significant infection  Plan: Discharge to home  Prescription: Albuterol inhaler  Patient urged to return with worsening symptoms or other concerns. Patient verbalized understanding and agrees with plan.   We did discuss need for steroids.  Patient states that steroids help, but does not need them at this time.                             Medical Decision Making Risk Prescription drug management.   Patient with increased wheezing, out of albuterol.  No infectious insults reported per history.  Low concern for pneumonia.  Low concern for PE, ACS.  Exam is otherwise unremarkable.  He looks well.  Normal oxygen saturation on room air.  Will treat symptomatically.  Encouraged PCP follow-up.        Final Clinical Impression(s) / ED Diagnoses Final diagnoses:  Exacerbation of asthma,  unspecified asthma severity, unspecified whether persistent    Rx / DC Orders ED Discharge Orders          Ordered    albuterol (VENTOLIN HFA) 108 (90 Base) MCG/ACT inhaler  Every 4 hours PRN        08/11/21 1222  Renne Crigler, PA-C 08/11/21 1222    Renne Crigler, PA-C 08/11/21 1223    Glendora Score, MD 08/11/21 1729

## 2021-11-27 ENCOUNTER — Emergency Department (HOSPITAL_COMMUNITY)
Admission: EM | Admit: 2021-11-27 | Discharge: 2021-11-27 | Disposition: A | Discharge status: Home / Self Care | Payer: Self-pay | Attending: Emergency Medicine | Admitting: Emergency Medicine

## 2021-11-27 ENCOUNTER — Other Ambulatory Visit: Payer: Self-pay

## 2021-11-27 ENCOUNTER — Encounter (HOSPITAL_COMMUNITY): Payer: Self-pay

## 2021-11-27 DIAGNOSIS — Z7952 Long term (current) use of systemic steroids: Secondary | ICD-10-CM | POA: Insufficient documentation

## 2021-11-27 DIAGNOSIS — J4521 Mild intermittent asthma with (acute) exacerbation: Secondary | ICD-10-CM | POA: Insufficient documentation

## 2021-11-27 MED ORDER — PREDNISONE 20 MG PO TABS
60.0000 mg | ORAL_TABLET | Freq: Once | ORAL | Status: AC
  Administered 2021-11-27: 60 mg via ORAL
  Filled 2021-11-27: qty 3

## 2021-11-27 MED ORDER — ALBUTEROL SULFATE (5 MG/ML) 0.5% IN NEBU: 2.5000 mg | INHALATION_SOLUTION | Freq: Four times a day (QID) | RESPIRATORY_TRACT | 12 refills | Status: DC | PRN

## 2021-11-27 MED ORDER — ALBUTEROL SULFATE HFA 108 (90 BASE) MCG/ACT IN AERS: 1.0000 | INHALATION_SPRAY | RESPIRATORY_TRACT | 3 refills | Status: DC | PRN

## 2021-11-27 MED ORDER — ALBUTEROL SULFATE HFA 108 (90 BASE) MCG/ACT IN AERS
2.0000 | INHALATION_SPRAY | RESPIRATORY_TRACT | Status: DC | PRN
  Administered 2021-11-27: 2 via RESPIRATORY_TRACT
  Filled 2021-11-27: qty 6.7

## 2021-11-27 MED ORDER — PREDNISONE 10 MG PO TABS: 40.0000 mg | ORAL_TABLET | Freq: Every day | ORAL | 0 refills | Status: AC

## 2021-11-27 NOTE — Discharge Instructions (Signed)
Prescriptions were sent to [facility] on [address].  1 of these is for prednisone.  Take this as prescribed for the next 4 days, starting tomorrow.  You did receive your first dose in the emergency department.  The other prescriptions are for inhalers and nebulizer solution.  Take this at home as needed.  If you develop worsening symptoms, please return to the emergency department.

## 2021-11-27 NOTE — ED Provider Notes (Signed)
[place] DEPT Provider Note   CSN: [id number] Arrival date & time: 11/27/21  1130     History  Chief Complaint  Patient presents with   Asthma    Jesus Haynes is a 46 y.o. male.   Asthma Associated symptoms include shortness of breath. Patient presents for chest tightness and shortness of breath.  He does have a history of asthma and states that his symptoms have been consistent with prior episodes of asthma exacerbation.  They have been worsening over the past 3 days.  He recently ran out of his home nebulizer solution and inhalers.  He denies any other associated symptoms.     Home Medications Prior to Admission medications   Medication Sig Start Date End Date Taking? Authorizing Provider  albuterol (PROVENTIL) (5 MG/ML) 0.5% nebulizer solution Take 0.5 mLs (2.5 mg total) by nebulization every 6 (six) hours as needed for wheezing or shortness of breath. 11/27/21  Yes Godfrey Pick, MD  predniSONE (DELTASONE) 10 MG tablet Take 4 tablets (40 mg total) by mouth daily for 4 days. 11/27/21 12/01/21 Yes Godfrey Pick, MD  albuterol (VENTOLIN HFA) 108 (90 Base) MCG/ACT inhaler Inhale 1-2 puffs into the lungs every 4 (four) hours as needed for wheezing or shortness of breath. 11/27/21   Godfrey Pick, MD  cetirizine (ZYRTEC) 10 MG tablet Take 1 tablet (10 mg total) by mouth daily. Patient not taking: Reported on 01/26/2019 02/23/18 07/26/20  Maczis, Barth Kirks, PA-C  fluticasone [facility]) 50 MCG/ACT nasal spray Place 1-2 sprays into both nostrils daily. Patient not taking: Reported on 01/26/2019 02/23/18 07/26/20  Jillyn Ledger, PA-C      Allergies    Patient has no known allergies.    Review of Systems   Review of Systems  Respiratory:  Positive for chest tightness, shortness of breath and wheezing.   All other systems reviewed and are negative.  Physical Exam Updated Vital Signs BP (!) 126/92 (BP Location: Left Arm)   Pulse 80   Temp 97.9 F (36.6 C)  (Oral)   Resp 20   Ht 5\' 6"  (1.676 m)   Wt 97.5 kg   SpO2 96%   BMI 34.70 kg/m  Physical Exam Vitals and nursing note reviewed.  Constitutional:      General: He is not in acute distress.    Appearance: Normal appearance. He is well-developed and normal weight. He is not ill-appearing, toxic-appearing or diaphoretic.  HENT:     Head: Normocephalic and atraumatic.     Right Ear: External ear normal.     Left Ear: External ear normal.     Nose: Nose normal.     Mouth/Throat:     Mouth: Mucous membranes are moist.     Pharynx: Oropharynx is clear.  Eyes:     Extraocular Movements: Extraocular movements intact.     Conjunctiva/sclera: Conjunctivae normal.  Cardiovascular:     Rate and Rhythm: Normal rate and regular rhythm.     Heart sounds: No murmur heard. Pulmonary:     Effort: Pulmonary effort is normal. No respiratory distress.     Breath sounds: No stridor. Wheezing present. No rhonchi or rales.  Chest:     Chest wall: No tenderness.  Abdominal:     Palpations: Abdomen is soft.     Tenderness: There is no abdominal tenderness.  Musculoskeletal:        General: No swelling. Normal range of motion.     Cervical back: Normal range of motion and  neck supple.     Right lower leg: No edema.     Left lower leg: No edema.  Skin:    General: Skin is warm and dry.     Capillary Refill: Capillary refill takes less than 2 seconds.     Coloration: Skin is not jaundiced or pale.  Neurological:     General: No focal deficit present.     Mental Status: He is alert and oriented to person, place, and time.     Cranial Nerves: No cranial nerve deficit.     Sensory: No sensory deficit.     Motor: No weakness.     Coordination: Coordination normal.  Psychiatric:        Mood and Affect: Mood normal.        Behavior: Behavior normal.        Thought Content: Thought content normal.        Judgment: Judgment normal.    ED Results / Procedures / Treatments   Labs (all labs ordered  are listed, but only abnormal results are displayed) Labs Reviewed - No data to display  EKG None  Radiology No results found.  Procedures Procedures    Medications Ordered in ED Medications  albuterol (VENTOLIN HFA) 108 (90 Base) MCG/ACT inhaler 2 puff (2 puffs Inhalation Given 11/27/21 1142)  predniSONE (DELTASONE) tablet 60 mg (has no administration in time range)    ED Course/ Medical Decision Making/ A&P                           Medical Decision Making Risk Prescription drug management.   Patient presents for asthma exacerbation.  His symptoms of chest tightness, shortness of breath, and wheezing have been present over the past 3 days.  He ran out of his home nebulizer solution as well as his home inhalers.  He presents to the ED from work for treatment and new prescriptions.  On assessment, patient is well-appearing.  His vital signs are normal.  His breathing is unlabored and he is able to speak in complete sentences.  On lung auscultation, he does diffuse expiratory wheeze.  He was given puffs from albuterol inhaler.  Dose of prednisone was ordered.  Patient was offered nebulized breathing treatment to further optimize, however, he declined stating that he had to get back to work.  He did request renewed prescriptions for his home breathing treatments and these were sent to his pharmacy, in addition to continued prednisone over the next 4 days.  He was advised to return to the ED at any time for any worsening symptoms.  He was discharged in good condition.        Final Clinical Impression(s) / ED Diagnoses Final diagnoses:  Mild intermittent asthma with exacerbation    Rx / DC Orders ED Discharge Orders          Ordered    albuterol (VENTOLIN HFA) 108 (90 Base) MCG/ACT inhaler  Every 4 hours PRN        11/27/21 1224    albuterol (PROVENTIL) (5 MG/ML) 0.5% nebulizer solution  Every 6 hours PRN        11/27/21 1224    predniSONE (DELTASONE) 10 MG tablet  Daily         11/27/21 1224              Godfrey Pick, MD 11/27/21 1230

## 2021-11-27 NOTE — ED Triage Notes (Signed)
Patient reports SOB due to asthma since last night. Patient states his Albuterol inhaler was empty this AM.

## 2022-06-20 ENCOUNTER — Emergency Department (HOSPITAL_COMMUNITY)
Admission: EM | Admit: 2022-06-20 | Discharge: 2022-06-20 | Disposition: A | Discharge status: Home / Self Care | Payer: Self-pay | Attending: Physician Assistant | Admitting: Physician Assistant

## 2022-06-20 ENCOUNTER — Encounter (HOSPITAL_COMMUNITY): Payer: Self-pay

## 2022-06-20 ENCOUNTER — Other Ambulatory Visit: Payer: Self-pay

## 2022-06-20 DIAGNOSIS — Z7951 Long term (current) use of inhaled steroids: Secondary | ICD-10-CM | POA: Insufficient documentation

## 2022-06-20 DIAGNOSIS — J4521 Mild intermittent asthma with (acute) exacerbation: Secondary | ICD-10-CM | POA: Insufficient documentation

## 2022-06-20 MED ORDER — PREDNISONE 20 MG PO TABS: 40.0000 mg | ORAL_TABLET | Freq: Every day | ORAL | 0 refills | Status: AC

## 2022-06-20 MED ORDER — ALBUTEROL SULFATE HFA 108 (90 BASE) MCG/ACT IN AERS
2.0000 | INHALATION_SPRAY | Freq: Once | RESPIRATORY_TRACT | Status: AC
  Administered 2022-06-20: 2 via RESPIRATORY_TRACT
  Filled 2022-06-20: qty 6.7

## 2022-06-20 MED ORDER — AEROCHAMBER Z-STAT PLUS/MEDIUM MISC
Status: AC
  Filled 2022-06-20: qty 1

## 2022-06-20 MED ORDER — ALBUTEROL SULFATE HFA 108 (90 BASE) MCG/ACT IN AERS: 1.0000 | INHALATION_SPRAY | RESPIRATORY_TRACT | 3 refills | Status: DC | PRN

## 2022-06-20 MED ORDER — ALBUTEROL SULFATE (5 MG/ML) 0.5% IN NEBU: 2.5000 mg | INHALATION_SOLUTION | Freq: Four times a day (QID) | RESPIRATORY_TRACT | 12 refills | Status: DC | PRN

## 2022-06-20 NOTE — ED Provider Notes (Signed)
[place] DEPT Provider Note   CSN: [id number] Arrival date & time: 06/20/22  1040     History  Chief Complaint  Patient presents with   Asthma    Jesus Haynes is a 46 y.o. male.  HPI 46 year old male who presents to the ER requesting medication refill.  He reports that his asthma has been flaring up due to the cold weather.  He states he ran out of his albuterol and his nebulizer solution.  Denies any cough, fevers chills.  He is uninsured, states he was referred to [facility] community health and wellness but was having difficulties reaching them and has not scheduled an appointment.    Home Medications Prior to Admission medications   Medication Sig Start Date End Date Taking? Authorizing Provider  albuterol (PROVENTIL) (5 MG/ML) 0.5% nebulizer solution Take 0.5 mLs (2.5 mg total) by nebulization every 6 (six) hours as needed for wheezing or shortness of breath. 06/20/22   Garald Balding, PA-C  albuterol (VENTOLIN HFA) 108 (90 Base) MCG/ACT inhaler Inhale 1-2 puffs into the lungs every 4 (four) hours as needed for wheezing or shortness of breath. 06/20/22   Garald Balding, PA-C  cetirizine (ZYRTEC) 10 MG tablet Take 1 tablet (10 mg total) by mouth daily. Patient not taking: Reported on 01/26/2019 02/23/18 07/26/20  Maczis, Barth Kirks, PA-C  fluticasone [facility]) 50 MCG/ACT nasal spray Place 1-2 sprays into both nostrils daily. Patient not taking: Reported on 01/26/2019 02/23/18 07/26/20  Jillyn Ledger, PA-C      Allergies    Patient has no known allergies.    Review of Systems   Review of Systems Ten systems reviewed and are negative for acute change, except as noted in the HPI.   Physical Exam Updated Vital Signs BP (!) 151/93 (BP Location: Left Arm)   Pulse 85   Temp 97.7 F (36.5 C) (Oral)   Resp 16   SpO2 97%  Physical Exam Vitals and nursing note reviewed.  Constitutional:      General: He is not in acute distress.    Appearance:  He is well-developed.  HENT:     Head: Normocephalic and atraumatic.  Eyes:     Conjunctiva/sclera: Conjunctivae normal.  Cardiovascular:     Rate and Rhythm: Normal rate and regular rhythm.     Heart sounds: No murmur heard. Pulmonary:     Effort: Pulmonary effort is normal. No respiratory distress.     Breath sounds: Wheezing present.     Comments: No audible wheezing but bilateral wheezes heard with auscultatory exam.  No evidence of respiratory distress.  No rhonchi, rales. Abdominal:     Palpations: Abdomen is soft.     Tenderness: There is no abdominal tenderness.  Musculoskeletal:        General: No swelling.     Cervical back: Neck supple.  Skin:    General: Skin is warm and dry.     Capillary Refill: Capillary refill takes less than 2 seconds.  Neurological:     Mental Status: He is alert.  Psychiatric:        Mood and Affect: Mood normal.     ED Results / Procedures / Treatments   Labs (all labs ordered are listed, but only abnormal results are displayed) Labs Reviewed - No data to display  EKG None  Radiology No results found.  Procedures Procedures    Medications Ordered in ED Medications  albuterol (VENTOLIN HFA) 108 (90 Base) MCG/ACT inhaler 2  puff (has no administration in time range)    ED Course/ Medical Decision Making/ A&P                           Medical Decision Making Risk Prescription drug management.   46 year old male presenting with request of medication refill for asthma.  He does have bilateral wheezing but is in no respiratory distress.  No hypoxia.  I offered a chest x-ray and COVID/flu swab but he refused this.  He states "I know my body", and would just like a refill of his nebulizer and his inhaler.  He does not appear to be in any respiratory distress.  I provided him an inhaler here, refilled nebulizer solution, and will provide a short course of steroids.  We discussed following up again with [facility] for management of his asthma.  He will attempt to call the phone number or go to the office to schedule an appointment.  We discussed return precautions.  He was understanding and is agreeable.  Stable for charge. Final Clinical Impression(s) / ED Diagnoses Final diagnoses:  Mild intermittent asthma with exacerbation    Rx / DC Orders ED Discharge Orders          Ordered    albuterol (PROVENTIL) (5 MG/ML) 0.5% nebulizer solution  Every 6 hours PRN        06/20/22 1101    albuterol (VENTOLIN HFA) 108 (90 Base) MCG/ACT inhaler  Every 4 hours PRN        06/20/22 1101              Leone Brand 06/20/22 1106    Alvira Monday, MD 06/21/22 0025

## 2022-06-20 NOTE — Discharge Instructions (Addendum)
You were evaluated in the Emergency Department and after careful evaluation, we did not find any emergent condition requiring admission or further testing in the hospital.  Please take your inhaler, nebulizer, and the steroid course as directed until finished.  Follow-up with [facility] for further management of your asthma.  Please return to the Emergency Department if you experience any worsening of your condition.  We encourage you to follow up with a primary care provider.  Thank you for allowing [place] to be a part of your care.

## 2022-06-20 NOTE — ED Triage Notes (Signed)
Patient said he has asthma and has been out of an inhaler and nebulizer solution. He said he feels like he has been wheezing.

## 2023-04-28 ENCOUNTER — Other Ambulatory Visit: Payer: Self-pay

## 2023-04-28 ENCOUNTER — Emergency Department (HOSPITAL_COMMUNITY)
Admission: EM | Admit: 2023-04-28 | Discharge: 2023-04-29 | Disposition: A | Discharge status: Home / Self Care | Payer: Medicaid Other | Attending: Emergency Medicine | Admitting: Emergency Medicine

## 2023-04-28 DIAGNOSIS — R062 Wheezing: Secondary | ICD-10-CM | POA: Diagnosis present

## 2023-04-28 DIAGNOSIS — J45901 Unspecified asthma with (acute) exacerbation: Secondary | ICD-10-CM | POA: Diagnosis not present

## 2023-04-28 MED ORDER — PREDNISONE 20 MG PO TABS
60.0000 mg | ORAL_TABLET | Freq: Once | ORAL | Status: AC
Start: 2023-04-28 — End: 2023-04-28
  Administered 2023-04-28: 60 mg via ORAL
  Filled 2023-04-28: qty 3

## 2023-04-28 MED ORDER — ALBUTEROL SULFATE (5 MG/ML) 0.5% IN NEBU: 2.5000 mg | INHALATION_SOLUTION | Freq: Four times a day (QID) | RESPIRATORY_TRACT | 3 refills | Status: DC | PRN

## 2023-04-28 MED ORDER — IPRATROPIUM-ALBUTEROL 0.5-2.5 (3) MG/3ML IN SOLN
3.0000 mL | Freq: Once | RESPIRATORY_TRACT | Status: AC
Start: 2023-04-28 — End: 2023-04-28
  Administered 2023-04-28: 3 mL via RESPIRATORY_TRACT
  Filled 2023-04-28: qty 3

## 2023-04-28 MED ORDER — IPRATROPIUM-ALBUTEROL 0.5-2.5 (3) MG/3ML IN SOLN
3.0000 mL | Freq: Once | RESPIRATORY_TRACT | Status: AC
  Administered 2023-04-28: 3 mL via RESPIRATORY_TRACT
  Filled 2023-04-28: qty 3

## 2023-04-28 MED ORDER — ALBUTEROL SULFATE HFA 108 (90 BASE) MCG/ACT IN AERS
2.0000 | INHALATION_SPRAY | RESPIRATORY_TRACT | Status: DC | PRN
  Filled 2023-04-28: qty 6.7

## 2023-04-28 NOTE — ED Provider Notes (Signed)
[facility]-EMERGENCY DEPT [facility] Emergency Department Provider Note MRN:  [id number]  Arrival date & time: 04/28/23     Chief Complaint   Shortness of Breath (Patient needs prescription for inhaler and neb solution for neb machine at home.)   History of Present Illness   Jesus Haynes is a 47 y.o. year-old male presents to the ED with chief complaint of wheezing.  He states that he is out of his inhaler.  Reports that he has had wheezing for the past several days.  Reports history of asthma.  Denies fever, chills, or productive cough.  History provided by patient.   Review of Systems  Pertinent positive and negative review of systems noted in HPI.    Physical Exam   Vitals:   04/28/23 2039  BP: 129/88  Pulse: 88  Resp: 20  Temp: 98.6 F (37 C)  SpO2: 97%    CONSTITUTIONAL:  well-appearing, NAD NEURO:  Alert and oriented x 3, CN 3-12 grossly intact EYES:  eyes equal and reactive ENT/NECK:  Supple, no stridor  CARDIO:  normal rate, regular rhythm, appears well-perfused  PULM:  No respiratory distress, wheezing present GI/GU:  non-distended,  MSK/SPINE:  No gross deformities, no edema, moves all extremities  SKIN:  no rash, atraumatic   *Additional and/or pertinent findings included in MDM below  Diagnostic and Interventional Summary    EKG Interpretation Date/Time:    Ventricular Rate:    PR Interval:    QRS Duration:    QT Interval:    QTC Calculation:   R Axis:      Text Interpretation:         Labs Reviewed - No data to display  No orders to display    Medications  ipratropium-albuterol (DUONEB) 0.5-2.5 (3) MG/3ML nebulizer solution 3 mL (has no administration in time range)  albuterol (VENTOLIN HFA) 108 (90 Base) MCG/ACT inhaler 2 puff (has no administration in time range)  predniSONE (DELTASONE) tablet 60 mg (has no administration in time range)  ipratropium-albuterol (DUONEB) 0.5-2.5 (3) MG/3ML nebulizer solution 3 mL (has no administration  in time range)     Procedures  /  Critical Care Procedures  ED Course and Medical Decision Making  I have reviewed the triage vital signs, the nursing notes, and pertinent available records from the EMR.  Social Determinants Affecting Complexity of Care: Patient has no clinically significant social determinants affecting this chief complaint..   ED Course:    Medical Decision Making Patient with mild wheezing.  Out of his inhaler.  Will give a neb and prednisone and Rx for inhaler and albuterol solution.  Risk Prescription drug management.         Consultants: No consultations were needed in caring for this patient.   Treatment and Plan: Emergency department workup does not suggest an emergent condition requiring admission or immediate intervention beyond  what has been performed at this time. The patient is safe for discharge and has  been instructed to return immediately for worsening symptoms, change in  symptoms or any other concerns    Final Clinical Impressions(s) / ED Diagnoses     ICD-10-CM   1. Exacerbation of asthma, unspecified asthma severity, unspecified whether persistent  J45.901       ED Discharge Orders          Ordered    albuterol (PROVENTIL) (5 MG/ML) 0.5% nebulizer solution  Every 6 hours PRN        04/28/23 2310  Discharge Instructions Discussed with and Provided to Patient:   Discharge Instructions   None      Roxy Horseman, PA-C 04/28/23 2311    Gilda Crease, MD 05/04/23 [phone]

## 2023-04-28 NOTE — ED Triage Notes (Signed)
Patient here with daughter but also needs a prescription refill for inhaler and neb solution for neb machine at home.

## 2023-05-08 ENCOUNTER — Emergency Department (HOSPITAL_COMMUNITY): Payer: Self-pay

## 2023-05-08 ENCOUNTER — Encounter (HOSPITAL_COMMUNITY): Payer: Self-pay

## 2023-05-08 ENCOUNTER — Emergency Department (HOSPITAL_COMMUNITY)
Admission: EM | Admit: 2023-05-08 | Discharge: 2023-05-08 | Disposition: A | Discharge status: Home / Self Care | Payer: Self-pay | Attending: Emergency Medicine | Admitting: Emergency Medicine

## 2023-05-08 DIAGNOSIS — Z7951 Long term (current) use of inhaled steroids: Secondary | ICD-10-CM | POA: Insufficient documentation

## 2023-05-08 DIAGNOSIS — Y900 Blood alcohol level of less than 20 mg/100 ml: Secondary | ICD-10-CM | POA: Insufficient documentation

## 2023-05-08 DIAGNOSIS — R0789 Other chest pain: Secondary | ICD-10-CM | POA: Insufficient documentation

## 2023-05-08 DIAGNOSIS — J45909 Unspecified asthma, uncomplicated: Secondary | ICD-10-CM | POA: Insufficient documentation

## 2023-05-08 LAB — COMPREHENSIVE METABOLIC PANEL
ALT: 22 U/L (ref 0–44)
AST: 18 U/L (ref 15–41)
Albumin: 3.8 g/dL (ref 3.5–5.0)
Alkaline Phosphatase: 60 U/L (ref 38–126)
Anion gap: 8 (ref 5–15)
BUN: 7 mg/dL (ref 6–20)
CO2: 25 mmol/L (ref 22–32)
Calcium: 8.8 mg/dL — ABNORMAL LOW (ref 8.9–10.3)
Chloride: 105 mmol/L (ref 98–111)
Creatinine, Ser: 0.85 mg/dL (ref 0.61–1.24)
GFR, Estimated: >60 mL/min (ref >60)
Glucose, Bld: 95 mg/dL (ref 70–99)
Potassium: 3.9 mmol/L (ref 3.5–5.1)
Sodium: 138 mmol/L (ref 135–145)
Total Bilirubin: 0.5 mg/dL (ref <1.2)
Total Protein: 6.6 g/dL (ref 6.5–8.1)

## 2023-05-08 LAB — CBC WITH DIFFERENTIAL/PLATELET
Abs Immature Granulocytes: 0.02 10*3/uL (ref 0.00–0.07)
Basophils Absolute: 0 10*3/uL (ref 0.0–0.1)
Basophils Relative: 1 %
Eosinophils Absolute: 0.3 10*3/uL (ref 0.0–0.5)
Eosinophils Relative: 3 %
HCT: 40.2 % (ref 39.0–52.0)
Hemoglobin: 13.1 g/dL (ref 13.0–17.0)
Immature Granulocytes: 0 %
Lymphocytes Relative: 21 %
Lymphs Abs: 1.8 10*3/uL (ref 0.7–4.0)
MCH: 27.7 pg (ref 26.0–34.0)
MCHC: 32.6 g/dL (ref 30.0–36.0)
MCV: 85 fL (ref 80.0–100.0)
Monocytes Absolute: 0.7 10*3/uL (ref 0.1–1.0)
Monocytes Relative: 8 %
Neutro Abs: 5.9 10*3/uL (ref 1.7–7.7)
Neutrophils Relative %: 67 %
Platelets: 292 10*3/uL (ref 150–400)
RBC: 4.73 MIL/uL (ref 4.22–5.81)
RDW: 12.9 % (ref 11.5–15.5)
WBC: 8.8 10*3/uL (ref 4.0–10.5)
nRBC: 0 % (ref 0.0–0.2)

## 2023-05-08 LAB — ETHANOL: Alcohol, Ethyl (B): <10 mg/dL (ref <10)

## 2023-05-08 LAB — LIPASE, BLOOD: Lipase: 26 U/L (ref 11–51)

## 2023-05-08 LAB — TROPONIN I (HIGH SENSITIVITY)
Troponin I (High Sensitivity): 2 ng/L (ref <18)
Troponin I (High Sensitivity): 2 ng/L (ref <18)

## 2023-05-08 MED ORDER — SUCRALFATE 1 G PO TABS: 1.0000 g | ORAL_TABLET | Freq: Three times a day (TID) | ORAL | 0 refills | Status: DC

## 2023-05-08 MED ORDER — PANTOPRAZOLE SODIUM 20 MG PO TBEC: 20.0000 mg | DELAYED_RELEASE_TABLET | Freq: Every day | ORAL | 0 refills | Status: DC

## 2023-05-08 NOTE — Discharge Instructions (Signed)
As discussed, your evaluation today has been largely reassuring.  But, it is important that you monitor your condition carefully, and do not hesitate to return to the ED if you develop new, or concerning changes in your condition.  Otherwise, please follow-up with your physician and our gastroenterology colleagues for appropriate ongoing care.

## 2023-05-08 NOTE — ED Triage Notes (Signed)
Pt generalized chest and SOB x "a couple weeks."  Pain score 7/10.  Denies back pain and n/v/d.  Hx of asthma.

## 2023-05-08 NOTE — ED Provider Notes (Signed)
[place] EMERGENCY DEPARTMENT AT [facility] Provider Note   CSN: [id number] Arrival date & time: 05/08/23  2130     History  Chief Complaint  Patient presents with   Chest Pain    Jesus Haynes is a 47 y.o. male.  HPI Patient is a history of asthma, otherwise well, non-smoker, presents with intermittent chest pain.  Pain is intermittent, and inconsistent in terms of location, timing, occurrence with any obvious precipitant.  Episodes are brief, patient notes that symptoms may be more present after eating, may be more present on the right lower chest.  No dyspnea, no consistency with prior asthma exacerbation, no persistent chest pain.    Home Medications Prior to Admission medications   Medication Sig Start Date End Date Taking? Authorizing Provider  pantoprazole (PROTONIX) 20 MG tablet Take 1 tablet (20 mg total) by mouth daily. For three weeks 05/08/23  Yes Gerhard Munch, MD  sucralfate (CARAFATE) 1 g tablet Take 1 tablet (1 g total) by mouth 4 (four) times daily -  with meals and at bedtime. 05/08/23  Yes Gerhard Munch, MD  albuterol (PROVENTIL) (5 MG/ML) 0.5% nebulizer solution Take 0.5 mLs (2.5 mg total) by nebulization every 6 (six) hours as needed for wheezing or shortness of breath. 04/28/23   Roxy Horseman, PA-C  albuterol (VENTOLIN HFA) 108 (90 Base) MCG/ACT inhaler Inhale 1-2 puffs into the lungs every 4 (four) hours as needed for wheezing or shortness of breath. 06/20/22   Mare Ferrari, PA-C  cetirizine (ZYRTEC) 10 MG tablet Take 1 tablet (10 mg total) by mouth daily. Patient not taking: Reported on 01/26/2019 02/23/18 07/26/20  Maczis, Elmer Sow, PA-C  fluticasone [facility]) 50 MCG/ACT nasal spray Place 1-2 sprays into both nostrils daily. Patient not taking: Reported on 01/26/2019 02/23/18 07/26/20  Jacinto Halim, PA-C      Allergies    Patient has no known allergies.    Review of Systems   Review of Systems  Physical Exam Updated Vital  Signs BP 116/72   Pulse 73   Temp 98 F (36.7 C) (Oral)   Resp 14   Ht 5\' 6"  (1.676 m)   Wt 90.7 kg   SpO2 96%   BMI 32.28 kg/m  Physical Exam Vitals and nursing note reviewed.  Constitutional:      General: He is not in acute distress.    Appearance: He is well-developed.  HENT:     Head: Normocephalic and atraumatic.  Eyes:     Conjunctiva/sclera: Conjunctivae normal.  Cardiovascular:     Rate and Rhythm: Normal rate and regular rhythm.  Pulmonary:     Effort: Pulmonary effort is normal. No respiratory distress.     Breath sounds: No stridor.  Abdominal:     General: There is no distension.     Tenderness: There is no abdominal tenderness. There is no guarding or rebound.  Skin:    General: Skin is warm and dry.  Neurological:     Mental Status: He is alert and oriented to person, place, and time.     ED Results / Procedures / Treatments   Labs (all labs ordered are listed, but only abnormal results are displayed) Labs Reviewed  COMPREHENSIVE METABOLIC PANEL - Abnormal; Notable for the following components:      Result Value   Calcium 8.8 (*)    All other components within normal limits  ETHANOL  LIPASE, BLOOD  CBC WITH DIFFERENTIAL/PLATELET  TROPONIN I (HIGH SENSITIVITY)  TROPONIN I (  HIGH SENSITIVITY)    EKG EKG Interpretation Date/Time:  Wednesday May 08 2023 09:18:37 EST Ventricular Rate:  85 PR Interval:  131 QRS Duration:  89 QT Interval:  339 QTC Calculation: 403 R Axis:   54  Text Interpretation: Sinus rhythm Confirmed by Gerhard Munch [phone]) on 05/08/2023 9:54:46 AM  Radiology DG Chest 2 View  Result Date: 05/08/2023 CLINICAL DATA:  Chest pain, shortness of breath EXAM: CHEST - 2 VIEW COMPARISON:  None Available. FINDINGS: Cardiac and mediastinal contours are within normal limits. Linear opacities in the left base, likely atelectasis. No other focal pulmonary opacity. No pleural effusion or pneumothorax. No acute osseous abnormality.  IMPRESSION: No acute cardiopulmonary process. Electronically Signed   By: Wiliam Ke M.D.   On: 05/08/2023 12:10   US Abdomen Limited RUQ (LIVER/GB)  Result Date: 05/08/2023 CLINICAL DATA:  Abdominal pain EXAM: ULTRASOUND ABDOMEN LIMITED RIGHT UPPER QUADRANT COMPARISON:  None Available. FINDINGS: Gallbladder: 4 mm gallbladder polyp. No gallbladder wall thickening or pericholecystic fluid. Negative sonographic Murphy's sign. Common bile duct: Diameter: 3.7 mm Liver: No focal lesion identified. Within normal limits in parenchymal echogenicity. Portal vein is patent on color Doppler imaging with normal direction of blood flow towards the liver. Other: None. IMPRESSION: 1. No cholelithiasis or sonographic evidence for acute cholecystitis. 2. 4 mm gallbladder polyp. No imaging follow-up needed. Electronically Signed   By: Annia Belt M.D.   On: 05/08/2023 10:59    Procedures Procedures    Medications Ordered in ED Medications - No data to display  ED Course/ Medical Decision Making/ A&P                                 Medical Decision Making Adult male with history of asthma, prior smoker presents with occasional chest pain, and unclear if patient there may be more right lower chest wall, upper abdominal pain intermittently.  Patient's absence of fever, hemodynamic instability is reassuring, no early evidence for bacteremia, sepsis.  Some suspicion for hepatobiliary dysfunction given location of his pain, possible temporal relationship with eating.  Patient had ultrasound, x-ray, labs. Cardiac 85 sinus normal Pulse ox 100% room air normal   Amount and/or Complexity of Data Reviewed Labs: ordered. Radiology: ordered.   1:52 PM Patient awake, alert, sitting on the edge of the bed.  We reviewed all findings, including x-ray, labs troponin normal, lipase normal, ultrasound without evidence for gallstones, though this may have been contributing and he may have passed a stone.  Given the  reassuring vital signs, labs, x-ray, no evidence for acute intra-abdominal or intrathoracic processes, some suspicion for gastroesophageal etiology, patient will start medications, follow-up with primary care and gastroenterology.        Final Clinical Impression(s) / ED Diagnoses Final diagnoses:  Atypical chest pain    Rx / DC Orders ED Discharge Orders          Ordered    pantoprazole (PROTONIX) 20 MG tablet  Daily        05/08/23 1352    sucralfate (CARAFATE) 1 g tablet  3 times daily with meals & bedtime       Note to Pharmacy: Take for one week   05/08/23 1352              Gerhard Munch, MD 05/08/23 1352

## 2023-05-27 ENCOUNTER — Other Ambulatory Visit: Payer: Self-pay

## 2023-05-27 ENCOUNTER — Inpatient Hospital Stay (HOSPITAL_COMMUNITY)
Admission: EM | Admit: 2023-05-27 | Discharge: 2023-05-30 | DRG: 375 | Disposition: A | Discharge status: Home / Self Care | Payer: Medicaid Other | Attending: Internal Medicine | Admitting: Internal Medicine

## 2023-05-27 ENCOUNTER — Encounter (HOSPITAL_COMMUNITY): Payer: Self-pay

## 2023-05-27 DIAGNOSIS — C787 Secondary malignant neoplasm of liver and intrahepatic bile duct: Secondary | ICD-10-CM | POA: Diagnosis present

## 2023-05-27 DIAGNOSIS — D62 Acute posthemorrhagic anemia: Secondary | ICD-10-CM | POA: Diagnosis present

## 2023-05-27 DIAGNOSIS — E669 Obesity, unspecified: Secondary | ICD-10-CM | POA: Diagnosis present

## 2023-05-27 DIAGNOSIS — K922 Gastrointestinal hemorrhage, unspecified: Secondary | ICD-10-CM | POA: Diagnosis present

## 2023-05-27 DIAGNOSIS — Z87891 Personal history of nicotine dependence: Secondary | ICD-10-CM

## 2023-05-27 DIAGNOSIS — E871 Hypo-osmolality and hyponatremia: Secondary | ICD-10-CM | POA: Diagnosis present

## 2023-05-27 DIAGNOSIS — E876 Hypokalemia: Secondary | ICD-10-CM | POA: Diagnosis present

## 2023-05-27 DIAGNOSIS — Z8249 Family history of ischemic heart disease and other diseases of the circulatory system: Secondary | ICD-10-CM

## 2023-05-27 DIAGNOSIS — Z6832 Body mass index (BMI) 32.0-32.9, adult: Secondary | ICD-10-CM

## 2023-05-27 DIAGNOSIS — D630 Anemia in neoplastic disease: Secondary | ICD-10-CM | POA: Diagnosis present

## 2023-05-27 DIAGNOSIS — C772 Secondary and unspecified malignant neoplasm of intra-abdominal lymph nodes: Secondary | ICD-10-CM | POA: Diagnosis present

## 2023-05-27 DIAGNOSIS — J45909 Unspecified asthma, uncomplicated: Secondary | ICD-10-CM | POA: Diagnosis present

## 2023-05-27 DIAGNOSIS — R748 Abnormal levels of other serum enzymes: Secondary | ICD-10-CM

## 2023-05-27 DIAGNOSIS — K921 Melena: Principal | ICD-10-CM

## 2023-05-27 DIAGNOSIS — D500 Iron deficiency anemia secondary to blood loss (chronic): Secondary | ICD-10-CM

## 2023-05-27 DIAGNOSIS — K219 Gastro-esophageal reflux disease without esophagitis: Secondary | ICD-10-CM | POA: Diagnosis present

## 2023-05-27 DIAGNOSIS — C155 Malignant neoplasm of lower third of esophagus: Principal | ICD-10-CM | POA: Diagnosis present

## 2023-05-27 DIAGNOSIS — C160 Malignant neoplasm of cardia: Secondary | ICD-10-CM

## 2023-05-27 LAB — CBC
HCT: 31 % — ABNORMAL LOW (ref 39.0–52.0)
Hemoglobin: 10.1 g/dL — ABNORMAL LOW (ref 13.0–17.0)
MCH: 27.4 pg (ref 26.0–34.0)
MCHC: 32.6 g/dL (ref 30.0–36.0)
MCV: 84.2 fL (ref 80.0–100.0)
Platelets: 352 10*3/uL (ref 150–400)
RBC: 3.68 MIL/uL — ABNORMAL LOW (ref 4.22–5.81)
RDW: 12.2 % (ref 11.5–15.5)
WBC: 9.5 10*3/uL (ref 4.0–10.5)
nRBC: 0 % (ref 0.0–0.2)

## 2023-05-27 LAB — COMPREHENSIVE METABOLIC PANEL
ALT: 23 U/L (ref 0–44)
AST: 17 U/L (ref 15–41)
Albumin: 3.5 g/dL (ref 3.5–5.0)
Alkaline Phosphatase: 51 U/L (ref 38–126)
Anion gap: 8 (ref 5–15)
BUN: 21 mg/dL — ABNORMAL HIGH (ref 6–20)
CO2: 23 mmol/L (ref 22–32)
Calcium: 8.7 mg/dL — ABNORMAL LOW (ref 8.9–10.3)
Chloride: 106 mmol/L (ref 98–111)
Creatinine, Ser: 0.92 mg/dL (ref 0.61–1.24)
GFR, Estimated: >60 mL/min (ref >60)
Glucose, Bld: 121 mg/dL — ABNORMAL HIGH (ref 70–99)
Potassium: 4 mmol/L (ref 3.5–5.1)
Sodium: 137 mmol/L (ref 135–145)
Total Bilirubin: 0.3 mg/dL (ref <1.2)
Total Protein: 6.8 g/dL (ref 6.5–8.1)

## 2023-05-27 LAB — URINALYSIS, ROUTINE W REFLEX MICROSCOPIC
Bilirubin Urine: NEGATIVE
Glucose, UA: NEGATIVE mg/dL
Hgb urine dipstick: NEGATIVE
Ketones, ur: 20 mg/dL — AB
Leukocytes,Ua: NEGATIVE
Nitrite: NEGATIVE
Protein, ur: NEGATIVE mg/dL
Specific Gravity, Urine: 1.028 (ref 1.005–1.030)
pH: 5 (ref 5.0–8.0)

## 2023-05-27 LAB — HEMOGLOBIN AND HEMATOCRIT, BLOOD
HCT: 29.1 % — ABNORMAL LOW (ref 39.0–52.0)
HCT: 27.6 % — ABNORMAL LOW (ref 39.0–52.0)
Hemoglobin: 9.2 g/dL — ABNORMAL LOW (ref 13.0–17.0)
Hemoglobin: 9.2 g/dL — ABNORMAL LOW (ref 13.0–17.0)

## 2023-05-27 LAB — HIV ANTIBODY (ROUTINE TESTING W REFLEX): HIV Screen 4th Generation wRfx: NONREACTIVE

## 2023-05-27 LAB — POC OCCULT BLOOD, ED: Fecal Occult Bld: POSITIVE — AB

## 2023-05-27 LAB — LIPASE, BLOOD: Lipase: 142 U/L — ABNORMAL HIGH (ref 11–51)

## 2023-05-27 MED ORDER — ONDANSETRON HCL 4 MG PO TABS
4.0000 mg | ORAL_TABLET | Freq: Four times a day (QID) | ORAL | Status: DC | PRN
  Administered 2023-05-30: 4 mg via ORAL
  Filled 2023-05-27: qty 1

## 2023-05-27 MED ORDER — ORAL CARE MOUTH RINSE: 15.0000 mL | OROMUCOSAL | Status: DC | PRN

## 2023-05-27 MED ORDER — ALBUTEROL SULFATE (2.5 MG/3ML) 0.083% IN NEBU
2.5000 mg | INHALATION_SOLUTION | RESPIRATORY_TRACT | Status: DC | PRN
  Administered 2023-05-28: 2.5 mg via RESPIRATORY_TRACT

## 2023-05-27 MED ORDER — PANTOPRAZOLE SODIUM 40 MG IV SOLR
80.0000 mg | Freq: Once | INTRAVENOUS | Status: AC
  Administered 2023-05-27: 80 mg via INTRAVENOUS
  Filled 2023-05-27: qty 20

## 2023-05-27 MED ORDER — ACETAMINOPHEN 325 MG PO TABS: 650.0000 mg | ORAL_TABLET | Freq: Four times a day (QID) | ORAL | Status: DC | PRN

## 2023-05-27 MED ORDER — ONDANSETRON HCL 4 MG/2ML IJ SOLN: 4.0000 mg | Freq: Four times a day (QID) | INTRAMUSCULAR | Status: DC | PRN

## 2023-05-27 MED ORDER — TRAZODONE HCL 50 MG PO TABS
25.0000 mg | ORAL_TABLET | Freq: Every evening | ORAL | Status: DC | PRN
  Filled 2023-05-27: qty 1

## 2023-05-27 MED ORDER — MORPHINE SULFATE (PF) 4 MG/ML IV SOLN
6.0000 mg | Freq: Once | INTRAVENOUS | Status: AC
  Administered 2023-05-27: 6 mg via INTRAVENOUS
  Filled 2023-05-27: qty 2

## 2023-05-27 MED ORDER — ACETAMINOPHEN 650 MG RE SUPP: 650.0000 mg | Freq: Four times a day (QID) | RECTAL | Status: DC | PRN

## 2023-05-27 MED ORDER — OXYCODONE HCL 5 MG PO TABS
5.0000 mg | ORAL_TABLET | ORAL | Status: DC | PRN
  Administered 2023-05-27 – 2023-05-30 (×13): 5 mg via ORAL
  Filled 2023-05-27 (×14): qty 1

## 2023-05-27 NOTE — ED Triage Notes (Signed)
Pt c/o generalized abdominal pain, n/v, and dark stools x2 weeks.  Pain score 6/10.  Pt reports he stopped taking prescribed medications.

## 2023-05-27 NOTE — ED Provider Notes (Signed)
[place] EMERGENCY DEPARTMENT AT [facility] Provider Note   CSN: [id number] Arrival date & time: 05/27/23  1012     History  Chief Complaint  Patient presents with   Abdominal Pain   Rectal Bleeding   Emesis    Jesus Haynes is a 47 y.o. male.  HPI    47 year old male comes in with chief complaint of epigastric abdominal discomfort.  Patient has past medical history of asthma.  Patient was seen in the ER on 11-6 with atypical chest pain.  He indicates at that time the chest pain was midsternal, but radiating around all of his chest.  After he was discharged, he has noted that the pain has moved more in the epigastric and lower chest region.  The pain has been present every day.  He had 1 episode of bloody bowel movement.  Subsequently he has noted dark tarry stools.  He has BMs have become irregular as well.  Patient has poor appetite and when he tries to eat he will dry heave.  He denies any emesis.  Patient denies any history of heavy alcohol use, heavy smoking, substance use disorder, heavy NSAID or prednisone use.  He has required burst of prednisone because of asthma.  There is no family history of IBD.  Home Medications Prior to Admission medications   Medication Sig Start Date End Date Taking? Authorizing Provider  albuterol (PROVENTIL) (5 MG/ML) 0.5% nebulizer solution Take 0.5 mLs (2.5 mg total) by nebulization every 6 (six) hours as needed for wheezing or shortness of breath. 04/28/23  Yes Roxy Horseman, PA-C  albuterol (VENTOLIN HFA) 108 (90 Base) MCG/ACT inhaler Inhale 1-2 puffs into the lungs every 4 (four) hours as needed for wheezing or shortness of breath. 06/20/22  Yes Mare Ferrari, PA-C  pantoprazole (PROTONIX) 20 MG tablet Take 1 tablet (20 mg total) by mouth daily. For three weeks 05/08/23  Yes Gerhard Munch, MD  sucralfate (CARAFATE) 1 g tablet Take 1 tablet (1 g total) by mouth 4 (four) times daily -  with meals and at bedtime. 05/08/23  Yes  Gerhard Munch, MD  cetirizine (ZYRTEC) 10 MG tablet Take 1 tablet (10 mg total) by mouth daily. Patient not taking: Reported on 01/26/2019 02/23/18 07/26/20  Maczis, Elmer Sow, PA-C  fluticasone [facility]) 50 MCG/ACT nasal spray Place 1-2 sprays into both nostrils daily. Patient not taking: Reported on 01/26/2019 02/23/18 07/26/20  Jacinto Halim, PA-C      Allergies    Patient has no known allergies.    Review of Systems   Review of Systems  All other systems reviewed and are negative.   Physical Exam Updated Vital Signs BP 119/82 (BP Location: Left Arm)   Pulse 86   Temp 98.3 F (36.8 C) (Oral)   Resp 16   Ht 5\' 6"  (1.676 m)   Wt 90.7 kg   SpO2 99%   BMI 32.28 kg/m  Physical Exam Vitals and nursing note reviewed.  Constitutional:      Appearance: He is well-developed.  HENT:     Head: Atraumatic.  Cardiovascular:     Rate and Rhythm: Normal rate.  Pulmonary:     Effort: Pulmonary effort is normal.  Abdominal:     Tenderness: There is abdominal tenderness in the epigastric area. There is no guarding or rebound.     Comments: Patient has guaiac positive stools with melena  Musculoskeletal:     Cervical back: Neck supple.  Skin:    General:  Skin is warm.  Neurological:     Mental Status: He is alert and oriented to person, place, and time.     ED Results / Procedures / Treatments   Labs (all labs ordered are listed, but only abnormal results are displayed) Labs Reviewed  LIPASE, BLOOD - Abnormal; Notable for the following components:      Result Value   Lipase 142 (*)    All other components within normal limits  COMPREHENSIVE METABOLIC PANEL - Abnormal; Notable for the following components:   Glucose, Bld 121 (*)    BUN 21 (*)    Calcium 8.7 (*)    All other components within normal limits  CBC - Abnormal; Notable for the following components:   RBC 3.68 (*)    Hemoglobin 10.1 (*)    HCT 31.0 (*)    All other components within normal limits  POC  OCCULT BLOOD, ED - Abnormal; Notable for the following components:   Fecal Occult Bld POSITIVE (*)    All other components within normal limits  URINALYSIS, ROUTINE W REFLEX MICROSCOPIC    EKG None  Radiology No results found.  Procedures Procedures    Medications Ordered in ED Medications  pantoprazole (PROTONIX) injection 80 mg (has no administration in time range)  morphine (PF) 4 MG/ML injection 6 mg (has no administration in time range)    ED Course/ Medical Decision Making/ A&P                                 Medical Decision Making Amount and/or Complexity of Data Reviewed Labs: ordered.  Risk Prescription drug management.   This patient presents to the ED with chief complaint(s) of abdominal discomfort, poor p.o. intake, dark stools with pertinent past medical history of asthma.The complaint involves an extensive differential diagnosis and also carries with it a high risk of complications and morbidity.    The differential diagnosis includes  Esophagitis Variceal bleeding PUD/Gastritis/ulcers GI tumor  Patient has melena on our exam.  He however has no risk factors for upper GI bleed.  There is no family history of IBD.  Patient's hemoglobin has dropped about 3 g from his visit earlier this month.  Additionally, lipase is slightly elevated and the BUN is at 21.  Additional history obtained: Records reviewed  previous lab workup including recent hemoglobin, lipase  Independent labs interpretation:  The following labs were independently interpreted: Hemoglobin has down trended to 10, lipase is also elevated.  BUN is 21.  Consultation: - Consulted or discussed management/test interpretation with external professional:  I discussed the case with Dr. Ewing Schlein, GI. He recommends clear liquid diet. They will see the patient as well.  Final Clinical Impression(s) / ED Diagnoses Final diagnoses:  Melena  Elevated lipase    Rx / DC Orders ED Discharge  Orders     None         Derwood Kaplan, MD 05/27/23 1431

## 2023-05-27 NOTE — H&P (Signed)
History and Physical  CALON FILAR WUJ:[id number] DOB: [birth year] DOA: 05/27/2023  PCP: Pcp, No   Chief Complaint: Abdominal discomfort, melena  HPI: Jesus Haynes is a 47 y.o. male with medical history significant for asthma being admitted to the hospital with concern for upper GI bleed.  He initially presented to the emergency department at [facility] 11/6, with complaints of chest or right upper quadrant abdominal pain, workup was unremarkable and he was sent home with Protonix and Carafate, though he did not take these consistently.  Over the last 2 to 3 days, he has continued to have epigastric burning abdominal pain, vague nausea and some retching.  He also had some dark tarry stools over the last 3 days.  He does occasionally take ibuprofen, but not on a regular basis.  No prior history of peptic ulcer disease, no recent fevers, chills, vomiting, chest pain.  Evaluation in the ER shows new anemia, ER provider discussed with GI who recommends observation admission and possible upper endoscopy.  Review of Systems: Please see HPI for pertinent positives and negatives. A complete 10 system review of systems are otherwise negative.  Past Medical History:  Diagnosis Date   Asthma    History reviewed. No pertinent surgical history.  Social History:  reports that he quit smoking about 10 years ago. His smoking use included cigarettes. He has never used smokeless tobacco. He reports that he does not drink alcohol and does not use drugs.   No Known Allergies  Family History  Problem Relation Age of Onset   Heart failure Father      Prior to Admission medications   Medication Sig Start Date End Date Taking? Authorizing Provider  albuterol (PROVENTIL) (5 MG/ML) 0.5% nebulizer solution Take 0.5 mLs (2.5 mg total) by nebulization every 6 (six) hours as needed for wheezing or shortness of breath. 04/28/23  Yes Roxy Horseman, PA-C  albuterol (VENTOLIN HFA) 108 (90 Base) MCG/ACT inhaler  Inhale 1-2 puffs into the lungs every 4 (four) hours as needed for wheezing or shortness of breath. 06/20/22  Yes Mare Ferrari, PA-C  pantoprazole (PROTONIX) 20 MG tablet Take 1 tablet (20 mg total) by mouth daily. For three weeks 05/08/23  Yes Gerhard Munch, MD  sucralfate (CARAFATE) 1 g tablet Take 1 tablet (1 g total) by mouth 4 (four) times daily -  with meals and at bedtime. 05/08/23  Yes Gerhard Munch, MD  cetirizine (ZYRTEC) 10 MG tablet Take 1 tablet (10 mg total) by mouth daily. Patient not taking: Reported on 01/26/2019 02/23/18 07/26/20  Maczis, Elmer Sow, PA-C  fluticasone [facility]) 50 MCG/ACT nasal spray Place 1-2 sprays into both nostrils daily. Patient not taking: Reported on 01/26/2019 02/23/18 07/26/20  Jacinto Halim, PA-C    Physical Exam: BP 119/82 (BP Location: Left Arm)   Pulse 86   Temp 98.3 F (36.8 C) (Oral)   Resp 16   Ht 5\' 6"  (1.676 m)   Wt 90.7 kg   SpO2 99%   BMI 32.28 kg/m   General:  Alert, oriented, calm, in no acute distress  Eyes: EOMI, clear conjuctivae, white sclerea Neck: supple, no masses, trachea mildline  Cardiovascular: RRR, no murmurs or rubs, no peripheral edema  Respiratory: clear to auscultation bilaterally, no wheezes, no crackles  Abdomen: soft, nontender, slightly distended, normal bowel tones heard  Skin: dry, no rashes  Musculoskeletal: no joint effusions, normal range of motion  Psychiatric: appropriate affect, normal speech  Neurologic: extraocular muscles intact, clear speech, moving  all extremities with intact sensorium         Labs on Admission:  Basic Metabolic Panel: Recent Labs  Lab 05/27/23 1102  NA 137  K 4.0  CL 106  CO2 23  GLUCOSE 121*  BUN 21*  CREATININE 0.92  CALCIUM 8.7*   Liver Function Tests: Recent Labs  Lab 05/27/23 1102  AST 17  ALT 23  ALKPHOS 51  BILITOT 0.3  PROT 6.8  ALBUMIN 3.5   Recent Labs  Lab 05/27/23 1102  LIPASE 142*   No results for input(s): "AMMONIA" in the last  168 hours. CBC: Recent Labs  Lab 05/27/23 1102  WBC 9.5  HGB 10.1*  HCT 31.0*  MCV 84.2  PLT 352   Cardiac Enzymes: No results for input(s): "CKTOTAL", "CKMB", "CKMBINDEX", "TROPONINI" in the last 168 hours.  BNP (last 3 results) No results for input(s): "BNP" in the last 8760 hours.  ProBNP (last 3 results) No results for input(s): "PROBNP" in the last 8760 hours.  CBG: No results for input(s): "GLUCAP" in the last 168 hours.  Radiological Exams on Admission: No results found.  Assessment/Plan Jesus Haynes is a 47 y.o. male with medical history significant for asthma being admitted to the hospital with concern for upper GI bleed.   Upper GI bleed-concern given abdominal discomfort, melena, new anemia.  Hemodynamically stable with no evidence of active bleed.  Not on any blood thinners, no significant risk factors for peptic ulcer disease.  Hemoccult positive in the ER. -Observation admission -Clear liquid diet -[facility] consulted  Blood loss anemia-given concern for upper GI bleed, and hemoglobin has fallen from 13 to 10 when compared to earlier this month.  Dynamically stable, no evidence of active bleeding. -Continue to trend hemoglobin 3 times daily -Transfuse as necessary to keep hemoglobin greater than 7 -Avoid blood thinners  DVT prophylaxis: SCDs only    Code Status: Full Code  Consults called: [facility]  Admission status: Observation  Time spent: 46 minutes  Samantha Olivera Sharlette Dense MD [facility] Pager [phone]  If 7PM-7AM, please contact night-coverage [email or web address] Password [facility]  05/27/2023, 3:22 PM

## 2023-05-27 NOTE — Consult Note (Signed)
Reason for Consult: Melena abdominal pain Referring Physician: Hospital team  Jesus Haynes is an 47 y.o. male.  HPI: Patient's hospital computer chart reviewed and patient seen and examined and discussed with the ER physician and initially started about a month ago as right-sided chest pain but it seems to now be in his abdomen and he has not had much of an appetite in a few days of pump inhibitors and Carafate did not seem to help with anything and made it worse and Pepto-Bismol did not bother help either and he does not drink or smoke but did try some ibuprofen for the pain at night to help him sleep but says he was not on that regularly and has not had any previous GI problems or workup and his family history is negative for any GI issues and has had some dry heaves but no vomiting and his black stools started over the last few days and he has not had any fever or weight loss and has no other complaints and has been able to work Past Medical History:  Diagnosis Date   Asthma     History reviewed. No pertinent surgical history.  Family History  Problem Relation Age of Onset   Heart failure Father     Social History:  reports that he quit smoking about 10 years ago. His smoking use included cigarettes. He has never used smokeless tobacco. He reports that he does not drink alcohol and does not use drugs.  Allergies: No Known Allergies  Medications: I have reviewed the patient's current medications.  Results for orders placed or performed during the hospital encounter of 05/27/23 (from the past 48 hour(s))  Lipase, blood     Status: Abnormal   Collection Time: 05/27/23 11:02 AM  Result Value Ref Range   Lipase 142 (H) 11 - 51 U/L    Comment: Performed at [facility], [address], [place], [place] [address]  Comprehensive metabolic panel     Status: Abnormal   Collection Time: 05/27/23 11:02 AM  Result Value Ref Range   Sodium 137 135 - 145 mmol/L   Potassium  4.0 3.5 - 5.1 mmol/L   Chloride 106 98 - 111 mmol/L   CO2 23 22 - 32 mmol/L   Glucose, Bld 121 (H) 70 - 99 mg/dL    Comment: Glucose reference range applies only to samples taken after fasting for at least 8 hours.   BUN 21 (H) 6 - 20 mg/dL   Creatinine, Ser 6.04 0.61 - 1.24 mg/dL   Calcium 8.7 (L) 8.9 - 10.3 mg/dL   Total Protein 6.8 6.5 - 8.1 g/dL   Albumin 3.5 3.5 - 5.0 g/dL   AST 17 15 - 41 U/L   ALT 23 0 - 44 U/L   Alkaline Phosphatase 51 38 - 126 U/L   Total Bilirubin 0.3 <1.2 mg/dL   GFR, Estimated >54 >09 mL/min    Comment: (NOTE) Calculated using the CKD-EPI Creatinine Equation (2021)    Anion gap 8 5 - 15    Comment: Performed at [facility], [address] [place], [place] [address]  CBC     Status: Abnormal   Collection Time: 05/27/23 11:02 AM  Result Value Ref Range   WBC 9.5 4.0 - 10.5 K/uL   RBC 3.68 (L) 4.22 - 5.81 MIL/uL   Hemoglobin 10.1 (L) 13.0 - 17.0 g/dL   HCT 47.8 (L) 29.5 - 62.1 %   MCV 84.2 80.0 -  100.0 fL   MCH 27.4 26.0 - 34.0 pg   MCHC 32.6 30.0 - 36.0 g/dL   RDW 65.7 84.6 - 96.2 %   Platelets 352 150 - 400 K/uL   nRBC 0.0 0.0 - 0.2 %    Comment: Performed at [facility], [address] [place], [place] [address]  POC occult blood, ED     Status: Abnormal   Collection Time: 05/27/23  2:20 PM  Result Value Ref Range   Fecal Occult Bld POSITIVE (A) NEGATIVE    No results found.  Review of Systems negative except above Blood pressure 108/75, pulse 85, temperature 98.3 F (36.8 C), temperature source Oral, resp. rate 16, height 5\' 6"  (1.676 m), weight 90.7 kg, SpO2 96%. Physical Exam Vital signs stable afebrile no acute distress abdomen does have some very mild upper discomfort without guarding or rebound soft otherwise ultrasound a few weeks ago okay lipase slight elevation normal liver tests BUN up a little hemoglobin drop over 3 weeks with normal MCV Assessment/Plan: Abdominal pain melena questionable  etiology Plan: Risk benefits methods of endoscopy was discussed with the patient and we will proceed tomorrow with further workup and plans pending those findings and if nondiagnostic probably proceed with a CT scan next and we also discussed a possible colonoscopy at some point or for screening as an outpatient  [facility] E 05/27/2023, 3:45 PM

## 2023-05-28 ENCOUNTER — Inpatient Hospital Stay (HOSPITAL_COMMUNITY): Payer: Self-pay

## 2023-05-28 ENCOUNTER — Observation Stay (HOSPITAL_COMMUNITY): Payer: Self-pay | Admitting: Registered Nurse

## 2023-05-28 ENCOUNTER — Encounter (HOSPITAL_COMMUNITY): Payer: Self-pay | Admitting: Internal Medicine

## 2023-05-28 ENCOUNTER — Observation Stay (HOSPITAL_BASED_OUTPATIENT_CLINIC_OR_DEPARTMENT_OTHER): Payer: Medicaid Other | Admitting: Registered Nurse

## 2023-05-28 ENCOUNTER — Other Ambulatory Visit: Payer: Self-pay

## 2023-05-28 ENCOUNTER — Encounter (HOSPITAL_COMMUNITY)
Admission: EM | Disposition: A | Discharge status: Home / Self Care | Payer: Self-pay | Source: Home / Self Care | Attending: Internal Medicine

## 2023-05-28 DIAGNOSIS — C772 Secondary and unspecified malignant neoplasm of intra-abdominal lymph nodes: Secondary | ICD-10-CM | POA: Diagnosis present

## 2023-05-28 DIAGNOSIS — K921 Melena: Secondary | ICD-10-CM | POA: Diagnosis present

## 2023-05-28 DIAGNOSIS — R1013 Epigastric pain: Secondary | ICD-10-CM | POA: Diagnosis not present

## 2023-05-28 DIAGNOSIS — D500 Iron deficiency anemia secondary to blood loss (chronic): Secondary | ICD-10-CM | POA: Diagnosis not present

## 2023-05-28 DIAGNOSIS — C787 Secondary malignant neoplasm of liver and intrahepatic bile duct: Secondary | ICD-10-CM | POA: Diagnosis present

## 2023-05-28 DIAGNOSIS — K922 Gastrointestinal hemorrhage, unspecified: Secondary | ICD-10-CM | POA: Diagnosis not present

## 2023-05-28 DIAGNOSIS — K219 Gastro-esophageal reflux disease without esophagitis: Secondary | ICD-10-CM | POA: Diagnosis present

## 2023-05-28 DIAGNOSIS — Z87891 Personal history of nicotine dependence: Secondary | ICD-10-CM | POA: Diagnosis not present

## 2023-05-28 DIAGNOSIS — E669 Obesity, unspecified: Secondary | ICD-10-CM | POA: Diagnosis present

## 2023-05-28 DIAGNOSIS — C160 Malignant neoplasm of cardia: Secondary | ICD-10-CM | POA: Diagnosis not present

## 2023-05-28 DIAGNOSIS — Z6832 Body mass index (BMI) 32.0-32.9, adult: Secondary | ICD-10-CM | POA: Diagnosis not present

## 2023-05-28 DIAGNOSIS — E871 Hypo-osmolality and hyponatremia: Secondary | ICD-10-CM | POA: Diagnosis present

## 2023-05-28 DIAGNOSIS — J45909 Unspecified asthma, uncomplicated: Secondary | ICD-10-CM | POA: Diagnosis present

## 2023-05-28 DIAGNOSIS — D630 Anemia in neoplastic disease: Secondary | ICD-10-CM | POA: Diagnosis present

## 2023-05-28 DIAGNOSIS — E876 Hypokalemia: Secondary | ICD-10-CM | POA: Diagnosis present

## 2023-05-28 DIAGNOSIS — Z8249 Family history of ischemic heart disease and other diseases of the circulatory system: Secondary | ICD-10-CM | POA: Diagnosis not present

## 2023-05-28 DIAGNOSIS — C155 Malignant neoplasm of lower third of esophagus: Secondary | ICD-10-CM | POA: Diagnosis present

## 2023-05-28 DIAGNOSIS — D62 Acute posthemorrhagic anemia: Secondary | ICD-10-CM | POA: Diagnosis present

## 2023-05-28 HISTORY — PX: ESOPHAGOGASTRODUODENOSCOPY (EGD) WITH PROPOFOL: SHX5813

## 2023-05-28 HISTORY — PX: BIOPSY: SHX5522

## 2023-05-28 LAB — BASIC METABOLIC PANEL
Anion gap: 8 (ref 5–15)
BUN: 16 mg/dL (ref 6–20)
CO2: 23 mmol/L (ref 22–32)
Calcium: 8.3 mg/dL — ABNORMAL LOW (ref 8.9–10.3)
Chloride: 103 mmol/L (ref 98–111)
Creatinine, Ser: 0.88 mg/dL (ref 0.61–1.24)
GFR, Estimated: >60 mL/min (ref >60)
Glucose, Bld: 112 mg/dL — ABNORMAL HIGH (ref 70–99)
Potassium: 3.4 mmol/L — ABNORMAL LOW (ref 3.5–5.1)
Sodium: 134 mmol/L — ABNORMAL LOW (ref 135–145)

## 2023-05-28 LAB — HEMOGLOBIN AND HEMATOCRIT, BLOOD
HCT: 28.1 % — ABNORMAL LOW (ref 39.0–52.0)
HCT: 27.4 % — ABNORMAL LOW (ref 39.0–52.0)
Hemoglobin: 8.8 g/dL — ABNORMAL LOW (ref 13.0–17.0)
Hemoglobin: 8.8 g/dL — ABNORMAL LOW (ref 13.0–17.0)

## 2023-05-28 LAB — CBC
HCT: 26.5 % — ABNORMAL LOW (ref 39.0–52.0)
Hemoglobin: 8.8 g/dL — ABNORMAL LOW (ref 13.0–17.0)
MCH: 27.8 pg (ref 26.0–34.0)
MCHC: 33.2 g/dL (ref 30.0–36.0)
MCV: 83.9 fL (ref 80.0–100.0)
Platelets: 290 10*3/uL (ref 150–400)
RBC: 3.16 MIL/uL — ABNORMAL LOW (ref 4.22–5.81)
RDW: 12.3 % (ref 11.5–15.5)
WBC: 7.7 10*3/uL (ref 4.0–10.5)
nRBC: 0 % (ref 0.0–0.2)

## 2023-05-28 SURGERY — ESOPHAGOGASTRODUODENOSCOPY (EGD) WITH PROPOFOL
Anesthesia: Monitor Anesthesia Care

## 2023-05-28 MED ORDER — PROPOFOL 10 MG/ML IV BOLUS
INTRAVENOUS | Status: DC | PRN
  Administered 2023-05-28: 10 mg via INTRAVENOUS
  Administered 2023-05-28: 50 mg via INTRAVENOUS
  Administered 2023-05-28: 100 mg via INTRAVENOUS
  Administered 2023-05-28: 40 mg via INTRAVENOUS

## 2023-05-28 MED ORDER — PROPOFOL 1000 MG/100ML IV EMUL
INTRAVENOUS | Status: AC
  Filled 2023-05-28: qty 100

## 2023-05-28 MED ORDER — PROPOFOL 500 MG/50ML IV EMUL
INTRAVENOUS | Status: DC | PRN
  Administered 2023-05-28: 100 ug/kg/min via INTRAVENOUS

## 2023-05-28 MED ORDER — ONDANSETRON HCL 4 MG/2ML IJ SOLN
INTRAMUSCULAR | Status: DC | PRN
  Administered 2023-05-28: 4 mg via INTRAVENOUS

## 2023-05-28 MED ORDER — FENTANYL CITRATE (PF) 100 MCG/2ML IJ SOLN
50.0000 ug | Freq: Once | INTRAMUSCULAR | Status: AC
  Administered 2023-05-28: 50 ug via INTRAVENOUS

## 2023-05-28 MED ORDER — ALBUTEROL SULFATE (2.5 MG/3ML) 0.083% IN NEBU
INHALATION_SOLUTION | RESPIRATORY_TRACT | Status: AC
  Filled 2023-05-28: qty 3

## 2023-05-28 MED ORDER — PROPOFOL 500 MG/50ML IV EMUL
INTRAVENOUS | Status: AC
  Filled 2023-05-28: qty 50

## 2023-05-28 MED ORDER — SODIUM CHLORIDE 0.9 % IV SOLN: INTRAVENOUS | Status: DC | PRN

## 2023-05-28 MED ORDER — IOHEXOL 300 MG/ML  SOLN
100.0000 mL | Freq: Once | INTRAMUSCULAR | Status: AC | PRN
  Administered 2023-05-28: 100 mL via INTRAVENOUS

## 2023-05-28 MED ORDER — FENTANYL CITRATE (PF) 100 MCG/2ML IJ SOLN
INTRAMUSCULAR | Status: AC
  Filled 2023-05-28: qty 2

## 2023-05-28 MED ORDER — PROPOFOL 10 MG/ML IV BOLUS
INTRAVENOUS | Status: AC
  Filled 2023-05-28: qty 20

## 2023-05-28 MED ORDER — FENTANYL CITRATE (PF) 100 MCG/2ML IJ SOLN: 50.0000 ug | Freq: Once | INTRAMUSCULAR | Status: DC

## 2023-05-28 MED ORDER — PHENYLEPHRINE 80 MCG/ML (10ML) SYRINGE FOR IV PUSH (FOR BLOOD PRESSURE SUPPORT)
PREFILLED_SYRINGE | INTRAVENOUS | Status: DC | PRN
  Administered 2023-05-28 (×4): 80 ug via INTRAVENOUS
  Administered 2023-05-28: 160 ug via INTRAVENOUS
  Administered 2023-05-28 (×2): 80 ug via INTRAVENOUS

## 2023-05-28 SURGICAL SUPPLY — 14 items

## 2023-05-28 NOTE — Anesthesia Procedure Notes (Signed)
Procedure Name: MAC Date/Time: 05/28/2023 10:03 AM  Performed by: Maurene Capes, CRNAPre-anesthesia Checklist: Patient identified, Emergency Drugs available, Suction available, Timeout performed and Patient being monitored Patient Re-evaluated:Patient Re-evaluated prior to induction Oxygen Delivery Method: Simple face mask Preoxygenation: Pre-oxygenation with 100% oxygen Induction Type: IV induction Placement Confirmation: positive ETCO2 Dental Injury: Teeth and Oropharynx as per pre-operative assessment

## 2023-05-28 NOTE — Transfer of Care (Signed)
Immediate Anesthesia Transfer of Care Note  Patient: AVIR WUNSCH  Procedure(s) Performed: ESOPHAGOGASTRODUODENOSCOPY (EGD) WITH PROPOFOL BIOPSY  Patient Location: PACU and Endoscopy Unit  Anesthesia Type:MAC  Level of Consciousness: drowsy and patient cooperative  Airway & Oxygen Therapy: Patient Spontanous Breathing and Patient connected to face mask oxygen  Post-op Assessment: Report given to RN and Post -op Vital signs reviewed and stable  Post vital signs: Reviewed and stable  Last Vitals:  Vitals Value Taken Time  BP 93/40 05/28/23 1030  Temp 36.6 C 05/28/23 1029  Pulse 83 05/28/23 1033  Resp 18 05/28/23 1033  SpO2 96 % 05/28/23 1033  Vitals shown include unfiled device data.  Last Pain:  Vitals:   05/28/23 1029  TempSrc: Temporal  PainSc: 0-No pain      Patients Stated Pain Goal: 0 (05/28/23 0350)  Complications: No notable events documented.

## 2023-05-28 NOTE — Anesthesia Postprocedure Evaluation (Signed)
Anesthesia Post Note  Patient: Jesus Haynes  Procedure(s) Performed: ESOPHAGOGASTRODUODENOSCOPY (EGD) WITH PROPOFOL BIOPSY     Patient location during evaluation: PACU Anesthesia Type: MAC Level of consciousness: awake and alert Pain management: pain level controlled Vital Signs Assessment: post-procedure vital signs reviewed and stable Respiratory status: spontaneous breathing, nonlabored ventilation and respiratory function stable Cardiovascular status: blood pressure returned to baseline Postop Assessment: no apparent nausea or vomiting Anesthetic complications: no   No notable events documented.  Last Vitals:  Vitals:   05/28/23 1040 05/28/23 1050  BP: 105/64 116/67  Pulse: 82 81  Resp: 12 12  Temp:    SpO2: 93% 96%    Last Pain:  Vitals:   05/28/23 1050  TempSrc:   PainSc: 0-No pain                 Shanda Howells

## 2023-05-28 NOTE — Progress Notes (Signed)
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 normal duodenal bulb  Plan  Dyspepsia dark stool Obstructive fungating mass tumor lower third esophagus, obstructing fungating infiltrative mass in cardia additionally Endoscopy confirms-staging CT abdomen chest pelvis now Will curbside oncology while waiting for pathology-Will need to keep him on a soft diet for now but if he is stable we can probably discharge him in the next several days for outpatient close follow-up and PET scan  Anemia of acute blood loss Does not meet transfusion criteria yet Would probably place on a very soft diet or liquid diet until he can follow-up-given friability of the esophagus-we will see how he does over the next day or so  Asthma Stable   DVT prophylaxis: SCD for now  Status is: Observation The patient will require care spanning > 2 midnights and should be moved to inpatient because:   Needs inpatient as needs to be stable to discharge      Subjective: Awake coherent no distress Evaluated before going for endoscopy We talked a little bit afterwards about what to  expect  Objective + exam Vitals:   05/27/23 1629 05/27/23 1642 05/27/23 2007 05/28/23 0505  BP: 118/80  115/77 106/71  Pulse: 81  92 84  Resp: 20 14 18 17   Temp: 98.2 F (36.8 C)  98.5 F (36.9 C) 98.2 F (36.8 C)  TempSrc: Oral  Oral Oral  SpO2: 99%  95% 95%  Weight:      Height:       Filed Weights   05/27/23 1051  Weight: 90.7 kg    Examination:  EOMI NCAT no focal deficit no icterus no pallor Chest is clear no wheeze rales rhonchi Slight epigastric tenderness No lower extremity edema Power 5/5  Data Reviewed: reviewed   CBC    Component Value Date/Time   WBC 7.7 05/28/2023 0441   RBC 3.16 (L) 05/28/2023 0441   HGB 8.8 (L) 05/28/2023 0441   HCT 26.5 (L) 05/28/2023 0441   PLT 290 05/28/2023 0441   MCV 83.9 05/28/2023 0441   MCH 27.8 05/28/2023 0441   MCHC 33.2 05/28/2023 0441   RDW 12.3 05/28/2023 0441   LYMPHSABS 1.8 05/08/2023 1050   MONOABS 0.7 05/08/2023 1050   EOSABS 0.3 05/08/2023 1050   BASOSABS 0.0 05/08/2023 1050      Latest Ref Rng & Units 05/28/2023    4:41 AM 05/27/2023   11:02 AM 05/08/2023   10:50 AM  CMP  Glucose 70 - 99 mg/dL 846  962  95   BUN 6 - 20 mg/dL 16  21  7    Creatinine 0.61 - 1.24 mg/dL 9.52  8.41  0.85   Sodium 135 - 145 mmol/L 134  137  138   Potassium 3.5 - 5.1 mmol/L 3.4  4.0  3.9   Chloride 98 - 111 mmol/L 103  106  105   CO2 22 - 32 mmol/L 23  23  25    Calcium 8.9 - 10.3 mg/dL 8.3  8.7  8.8   Total Protein 6.5 - 8.1 g/dL  6.8  6.6   Total Bilirubin <1.2 mg/dL  0.3  0.5   Alkaline Phos 38 - 126 U/L  51  60   AST 15 - 41 U/L  17  18   ALT 0 - 44 U/L  23  22      Scheduled Meds: Continuous Infusions:  Time  50  Rhetta Mura, MD  [facility]

## 2023-05-28 NOTE — Progress Notes (Signed)
Jesus Haynes 9:44 AM  Subjective: Patient doing well without any new complaints and his pain is better and has not had a bowel movement we rediscussed of the procedure  Objective: Vital signs stable afebrile no acute distress exam please see preassessment evaluation BUN and creatinine slight decrease hemoglobin fairly stable  Assessment: Abdominal pain melena  Plan: Okay to proceed with endoscopy with anesthesia assistance and if nondiagnostic we will proceed with CT next  [facility]  office [phone] After 5PM or if no answer call [phone]

## 2023-05-28 NOTE — Op Note (Signed)
[facility] Patient Name: Jesus Haynes Procedure Date: 05/28/2023 MRN: [id number] Attending MD: Vida Rigger , MD, [id number] Date of Birth: 11-02-1975 CSN: [id number] Age: 47 Admit Type: Inpatient Procedure:                Upper GI endoscopy Indications:              Epigastric abdominal pain, Melena Providers:                Vida Rigger, MD, Fransisca Connors, Lynnda Shields,                            Technician, Caprice Red, CRNA Referring MD:              Medicines:                Monitored Anesthesia Care Complications:            No immediate complications. Estimated Blood Loss:     Estimated blood loss was minimal. Procedure:                Pre-Anesthesia Assessment:                           - Prior to the procedure, a History and Physical                            was performed, and patient medications and                            allergies were reviewed. The patient's tolerance of                            previous anesthesia was also reviewed. The risks                            and benefits of the procedure and the sedation                            options and risks were discussed with the patient.                            All questions were answered, and informed consent                            was obtained. Prior Anticoagulants: The patient has                            taken no anticoagulant or antiplatelet agents. ASA                            Grade Assessment: II - A patient with mild systemic                            disease. After reviewing the risks and benefits,  the patient was deemed in satisfactory condition to                            undergo the procedure.                           After obtaining informed consent, the endoscope was                            passed under direct vision. Throughout the                            procedure, the patient's blood pressure, pulse, and                             oxygen saturations were monitored continuously. The                            [id number]-[id number] ([id number]) Olympus endoscope was introduced                            through the mouth, and advanced to the third part                            of duodenum. The upper GI endoscopy was                            accomplished without difficulty. The patient                            tolerated the procedure well. Scope In: Scope Out: Findings:      The larynx was normal.      A large, fungating and ulcerating mass with no bleeding and no stigmata       of recent bleeding was found in the lower third of the esophagus. The       mass was partially obstructing and circumferential. Biopsies were taken       with a cold forceps for histology.      A large, fungating, infiltrative and ulcerated, circumferential mass       with no bleeding and no stigmata of recent bleeding was found in the       cardia and part of the fundus. Biopsies were taken with a cold forceps       for histology.      The duodenal bulb, first portion of the duodenum, second portion of the       duodenum and third portion of the duodenum were normal.      The exam was otherwise without abnormality. Impression:               - Normal larynx.                           - Partially obstructing, likely malignant                            esophageal tumor was found in the lower  third of                            the esophagus. Biopsied.                           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                            Biopsied.                           - Normal duodenal bulb, first portion of the                            duodenum, second portion of the duodenum and third                            portion of the duodenum.                           - The examination was otherwise normal. Moderate Sedation:      Not Applicable - Patient had care per Anesthesia. Recommendation:           - Soft diet  today.                           - Continue present medications.                           - Await pathology results.                           - Return to GI clinic PRN.                           - Telephone GI clinic if symptomatic PRN.                           - Refer to an oncologist at appointment to be                            scheduled.                           - Perform a CT scan (computed tomography) of chest                            with contrast, abdomen with contrast and pelvis                            with contrast today. Procedure Code(s):        --- Professional ---                           [phone], Esophagogastroduodenoscopy, flexible,  transoral; with biopsy, single or multiple Diagnosis Code(s):        --- Professional ---                           D49.0, Neoplasm of unspecified behavior of                            digestive system                           R10.13, Epigastric pain                           K92.1, Melena (includes Hematochezia) CPT copyright 2022 American [employer]. All rights reserved. The codes documented in this report are preliminary and upon coder review may  be revised to meet current compliance requirements. Vida Rigger, MD 05/28/2023 10:26:02 AM This report has been signed electronically. Number of Addenda: 0

## 2023-05-28 NOTE — Anesthesia Preprocedure Evaluation (Addendum)
Anesthesia Evaluation  Patient identified by MRN, date of birth, ID band Patient awake    Reviewed: Allergy & Precautions, NPO status , Patient's Chart, lab work & pertinent test results  History of Anesthesia Complications Negative for: history of anesthetic complications  Airway Mallampati: II  TM Distance: >3 FB Neck ROM: Full    Dental  (+) Missing,    Pulmonary asthma , former smoker   Pulmonary exam normal        Cardiovascular negative cardio ROS Normal cardiovascular exam     Neuro/Psych negative neurological ROS     GI/Hepatic Neg liver ROS,GERD  Medicated,,GI bleed   Endo/Other  negative endocrine ROS    Renal/GU negative Renal ROS     Musculoskeletal negative musculoskeletal ROS (+)    Abdominal   Peds  Hematology  (+) Blood dyscrasia (Hgb 8.8), anemia   Anesthesia Other Findings Day of surgery medications reviewed with patient.  Reproductive/Obstetrics                             Anesthesia Physical Anesthesia Plan  ASA: 2  Anesthesia Plan: MAC   Post-op Pain Management: Minimal or no pain anticipated   Induction:   PONV Risk Score and Plan: Treatment may vary due to age or medical condition and Propofol infusion  Airway Management Planned: Natural Airway and Nasal Cannula  Additional Equipment: None  Intra-op Plan:   Post-operative Plan:   Informed Consent: I have reviewed the patients History and Physical, chart, labs and discussed the procedure including the risks, benefits and alternatives for the proposed anesthesia with the patient or authorized representative who has indicated his/her understanding and acceptance.       Plan Discussed with: CRNA  Anesthesia Plan Comments:        Anesthesia Quick Evaluation

## 2023-05-29 ENCOUNTER — Other Ambulatory Visit: Payer: Self-pay | Admitting: Oncology

## 2023-05-29 DIAGNOSIS — K921 Melena: Secondary | ICD-10-CM | POA: Diagnosis not present

## 2023-05-29 DIAGNOSIS — K922 Gastrointestinal hemorrhage, unspecified: Secondary | ICD-10-CM | POA: Diagnosis not present

## 2023-05-29 DIAGNOSIS — C160 Malignant neoplasm of cardia: Secondary | ICD-10-CM

## 2023-05-29 DIAGNOSIS — D500 Iron deficiency anemia secondary to blood loss (chronic): Secondary | ICD-10-CM

## 2023-05-29 MED ORDER — FAMOTIDINE 20 MG PO TABS
40.0000 mg | ORAL_TABLET | Freq: Every day | ORAL | Status: DC
  Administered 2023-05-29 – 2023-05-30 (×2): 40 mg via ORAL
  Filled 2023-05-29 (×2): qty 2

## 2023-05-29 MED ORDER — ENSURE ENLIVE PO LIQD
237.0000 mL | Freq: Two times a day (BID) | ORAL | Status: DC
  Administered 2023-05-29 – 2023-05-30 (×2): 237 mL via ORAL

## 2023-05-29 NOTE — Hospital Course (Addendum)
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 Biopsied. 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 Biopsied" OK FOR soft diet per GI.Underwent staging CT chest abdomen pelvis with contrast> showed thickening of the gastroesophageal junction compatible with patient's known carcinoma prominent lymph nodes in retrocrural region, soft tissue in the gastrohepatic region worrisome for metastatic disease worrisome for lymphadenopathy metastatic disease and periportal/para celiac, gastrosplenic and left retroperitoneal, hypodensities in the liver worrisome for metastatic liver disease. Vitals remained stable hemoglobin stable at 8.8 g.  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

## 2023-05-29 NOTE — Progress Notes (Signed)
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 Biopsied. 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 Biopsied" OK FOR soft diet per GI.Underwent staging CT chest abdomen pelvis with contrast> showed thickening of the gastroesophageal junction compatible with patient's known carcinoma prominent lymph nodes in retrocrural region, soft tissue in the gastrohepatic region worrisome for metastatic disease worrisome for lymphadenopathy metastatic disease and periportal/para celiac, gastrosplenic and left retroperitoneal, hypodensities in the liver worrisome for metastatic liver disease. Vitals remained stable hemoglobin stable at 8.8 g.  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  Starting diet today continue pain management await further oncology plan and likely for outpatient PET scan and further radiation/chemotherapy as outpatient.  Once this has been arranged by oncology and patient able to tolerate diet, will discharge with home.  Anemia of acute blood loss Anemia of malignancy: Hemoglobin holding 89 g range.  Monitor Recent Labs  Lab 05/27/23 1700 05/27/23 2320 05/28/23 0441 05/28/23 1253 05/28/23 1610  HGB 9.2* 9.2* 8.8* 8.8* 8.8*  HCT 29.1* 27.6* 26.5* 28.1* 27.4*   Mild hyponatremia hypokalemia: Stable labs  Obesity:Patient's Body mass index is 32.28 kg/m. : Will benefit with PCP follow-up  DVT prophylaxis: SCDs Start: 05/27/23 1516 Code Status:   Code Status: Full Code Family Communication: plan of care discussed with patient/family at bedside. Patient status is: Inpatient because of malignancy Level of care: Progressive   Dispo: The patient is from: home            Anticipated disposition: Tomorrow once seen by oncology  Objective: Vitals last 24 hrs: Vitals:   05/28/23 1050 05/28/23 1348 05/28/23 2051 05/29/23 0554  BP: 116/67 124/79 100/72 116/78  Pulse: 81 88 93 81  Resp: 12 16 18 18   Temp:  98.3 F (36.8 C) 99.7 F (37.6 C) 98.4 F (36.9 C)  TempSrc:  Oral Oral Oral  SpO2: 96% 95% 92% 95%  Weight:      Height:       Weight change:   Physical Examination: General exam: alert awake, older than stated age HEENT:Oral mucosa moist, Ear/Nose WNL grossly Respiratory system: bilaterally clear BS, no use of accessory muscle Cardiovascular system: S1 & S2 +, No JVD. Gastrointestinal system: Abdomen soft,NT,ND, BS+ Nervous System:Alert, awake, moving extremities. Extremities: LE edema neg,distal peripheral pulses palpable.  Skin: No rashes,no icterus. MSK: Normal muscle bulk,tone,  power  Medications reviewed:  Scheduled Meds: Continuous Infusions:    Diet Order             DIET SOFT Fluid  consistency: Thin  Diet effective now                  Intake/Output Summary (Last 24 hours) at 05/29/2023 1320 Last data filed at 05/28/2023 2100 Gross per 24 hour  Intake 240 ml  Output --  Net 240 ml   Net IO Since Admission: 630 mL [05/29/23 1320]  Wt Readings from Last 3 Encounters:  05/27/23 90.7 kg  05/08/23 90.7 kg  04/28/23 90.7 kg     Unresulted Labs (From admission, onward)    None     Data Reviewed: I have personally reviewed following labs and imaging studies CBC: Recent Labs  Lab 05/27/23 1102 05/27/23 1700 05/27/23 2320 05/28/23 0441 05/28/23 1253 05/28/23 1610  WBC 9.5  --   --  7.7  --   --   HGB 10.1* 9.2* 9.2* 8.8* 8.8* 8.8*  HCT 31.0* 29.1* 27.6* 26.5* 28.1* 27.4*  MCV 84.2  --   --  83.9  --   --   PLT 352  --   --  290  --   --    Basic Metabolic Panel:  Recent Labs  Lab 05/27/23 1102 05/28/23 0441  NA 137 134*  K 4.0 3.4*  CL 106 103  CO2 23 23  GLUCOSE 121* 112*  BUN 21* 16  CREATININE 0.92 0.88  CALCIUM 8.7* 8.3*   GFR: Estimated Creatinine Clearance: 109.5 mL/min (by C-G formula based on SCr of 0.88 mg/dL). Liver Function Tests:  Recent Labs  Lab 05/27/23 1102  AST 17  ALT 23  ALKPHOS 51  BILITOT 0.3  PROT 6.8  ALBUMIN 3.5   Recent Labs  Lab 05/27/23 1102  LIPASE 142*   No results for input(s): "AMMONIA" in the last 168 hours. No results found for this or any previous visit (from the past 240 hour(s)).  Antimicrobials: Anti-infectives (From admission, onward)    None      Culture/Microbiology    Component Value Date/Time   SDES THROAT 07/12/2016 1925   SPECREQUEST NONE Reflexed from [id number] 07/12/2016 1925   CULT RARE STREPTOCOCCUS,BETA HEMOLYTIC NOT GROUP A 07/12/2016 1925   REPTSTATUS 07/15/2016 FINAL 07/12/2016 1925    Radiology Studies: CT CHEST ABDOMEN PELVIS W CONTRAST  Result Date: 05/28/2023 CLINICAL DATA:  Metastatic disease evaluation, gastroesophageal junction cancer. EXAM: CT CHEST,  ABDOMEN, AND PELVIS WITH CONTRAST TECHNIQUE: Multidetector CT imaging of the chest, abdomen and pelvis was performed following the standard protocol during bolus administration of intravenous contrast. RADIATION DOSE REDUCTION: This exam was performed according to the departmental dose-optimization program which includes automated exposure control, adjustment of the mA and/or kV according to patient size and/or use of iterative reconstruction technique. CONTRAST:  OMNIPAQUE IOHEXOL 300 MG/ML  SOLN COMPARISON:  CT chest 08/08/2017 FINDINGS: CT CHEST FINDINGS Cardiovascular: No significant vascular findings. Normal heart size. No pericardial effusion. Mediastinum/Nodes: Visualized thyroid gland is within normal limits. A small hiatal hernia is present with thickening at the gastroesophageal junction likely related to patient's known carcinoma. Remaining esophagus is nondilated. There are prominent lymph nodes adjacent to the hiatal hernia in the retrocrural region on the right measuring up to 7 mm. No other enlarged mediastinal, hilar or axillary lymph nodes are seen. Lungs/Pleura: There are atelectatic changes in the lingula and bilateral lower lobes. The lungs are otherwise  clear. There is no pleural effusion or pneumothorax. There is a calcified granuloma in the right upper lobe. Musculoskeletal: No chest wall mass or suspicious bone lesions identified. CT ABDOMEN PELVIS FINDINGS Hepatobiliary: There are 2 hypodensities in the inferior right lobe of the liver measuring up to 13 mm. There is a subcentimeter hypodensity in the left lobe of the liver image 2/54. Liver parenchyma otherwise appears within normal limits. Gallbladder and bile ducts are within normal limits. There is fat stranding surrounding the body and tail of the pancreas. No ductal dilatation or fluid collection. Spleen: Normal in size without focal abnormality. Adrenals/Urinary Tract: Adrenal glands are unremarkable. Kidneys are normal, without  renal calculi, focal lesion, or hydronephrosis. Bladder is unremarkable. Stomach/Bowel: There is abnormal wall thickening in the gastroesophageal junction. A small hiatal hernia is present. Findings are likely related to patient's known carcinoma. Distal stomach, small bowel, colon and appendix are otherwise within normal limits. Vascular/Lymphatic: The aorta and IVC are patent. Portal vein and superior mesenteric vein are patent. Splenic vein is grossly patent. Abnormal low-density soft tissues seen in the gastro hepatic region abutting the lesser curvature and proximal stomach. The 2 largest areas measure 2.2 x 5.1 cm image 2/56 and 2.5 x 3.1 cm image 2/55. There is periportal lymphadenopathy with largest lymph node measuring up to 12 mm short axis image 2/60. There is paraceliac lymphadenopathy abutting the posterior body of the pancreas With largest lymph node measuring 3.7 x 1.7 cm image 2/60. Soft tissue nodular density seen in the gastrosplenic region measuring 2.1 x 1.3 cm image 2/54. There is a mesenteric nodule anterior to the gastric antrum measuring 1.5 x 1.4 cm image 2/61. There are additional numerous scattered nonenlarged peripancreatic and central mesenteric lymph nodes. There is left retroperitoneal lymphadenopathy at the level of the kidneys with largest lymph node measuring 14 mm short axis image 2/71. Reproductive: Prostate is unremarkable. Other: There is a small fat containing right inguinal and umbilical hernia. There is no ascites. Musculoskeletal: No acute fracture or focal osseous lesion. IMPRESSION: 1. Abnormal wall thickening at the gastroesophageal junction compatible with patient's known carcinoma. 2. Prominent lymph nodes in the retrocrural region on the right adjacent to the hiatal hernia. 3. Abnormal low-density soft tissue in the gastrohepatic region abutting the lesser curvature and proximal stomach worrisome for metastatic disease. 4. Periportal, paraceliac, gastrosplenic and left  retroperitoneal lymphadenopathy worrisome for metastatic disease. 5. Hypodensities in the liver worrisome for metastatic disease. 6. Fat stranding surrounding the body and tail of the pancreas worrisome for acute pancreatitis. No fluid collection. Electronically Signed   By: Darliss Cheney M.D.   On: 05/28/2023 23:01     LOS: 1 day   Total time spent in review of labs and imaging, patient evaluation, formulation of plan, documentation and communication with family: 35 minutes  Lanae Boast, MD [facility]  05/29/2023, 1:20 PM

## 2023-05-29 NOTE — TOC Initial Note (Signed)
Transition of Care [facility]) - Initial/Assessment Note    Patient Details  Name: Jesus Haynes MRN: [id number] Date of Birth: October 27, 1975  Transition of Care [facility]) CM/SW Contact:    Lanier Clam, RN Phone Number: 05/29/2023, 3:40 PM  Clinical Narrative:  Provided on AVS resources-PCP, & healthcare insurance. D/c plan home.                Expected Discharge Plan: Home/Self Care Barriers to Discharge: Continued Medical Work up   Patient Goals and CMS Choice Patient states their goals for this hospitalization and ongoing recovery are:: Home CMS Medicare.gov Compare Post Acute Care list provided to:: Patient Choice offered to / list presented to : Patient [place] ownership interest in [facility].provided to:: Patient    Expected Discharge Plan and Services   Discharge Planning Services: CM Consult Post Acute Care Choice: Resumption of Svcs/PTA Provider Living arrangements for the past 2 months: Single Family Home                                      Prior Living Arrangements/Services Living arrangements for the past 2 months: Single Family Home Lives with:: Relatives Patient language and need for interpreter reviewed:: Yes Do you feel safe going back to the place where you live?: Yes      Need for Family Participation in Patient Care: Yes (Comment) Care giver support system in place?: Yes (comment)   Criminal Activity/Legal Involvement Pertinent to Current Situation/Hospitalization: No - Comment as needed  Activities of Daily Living   ADL Screening (condition at time of admission) Independently performs ADLs?: Yes (appropriate for developmental age) Is the patient deaf or have difficulty hearing?: No Does the patient have difficulty seeing, even when wearing glasses/contacts?: No Does the patient have difficulty concentrating, remembering, or making decisions?: No  Permission Sought/Granted Permission sought to share information with : Case  Manager Permission granted to share information with : Yes, Verbal Permission Granted  Share Information with NAME: Case Manager           Emotional Assessment Appearance:: Appears stated age Attitude/Demeanor/Rapport: Gracious Affect (typically observed): Accepting Orientation: : Oriented to Self, Oriented to Place, Oriented to  Time, Oriented to Situation Alcohol / Substance Use: Not Applicable Psych Involvement: No (comment)  Admission diagnosis:  Melena [K92.1] Upper GI bleed [K92.2] Elevated lipase [R74.8] Patient Active Problem List   Diagnosis Date Noted   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 - [place]), [place] - [address]  ([place]) [place] [address] Phone: [phone] Fax: [phone]     Social Determinants of Health (SDOH) Social History: SDOH Screenings   Food Insecurity: No Food Insecurity (05/27/2023)  Housing: Low Risk  (05/27/2023)  Transportation Needs: No Transportation Needs (05/27/2023)  Utilities: Not At Risk (05/27/2023)  Tobacco Use: Medium Risk (05/28/2023)   SDOH Interventions:     Readmission Risk Interventions     No data to display

## 2023-05-29 NOTE — Consult Note (Signed)
[facility]  [phone]   HEMATOLOGY/ONCOLOGY IN-PATIENT CONSULTATION NOTE   PATIENT NAME: Jesus Haynes   MR#: [id number] DOB: 21-Aug-1975 CSN#: [id number]   DATE OF SERVICE: 05/29/2023  Requesting Physician: [facility]    REASON FOR CONSULTATION:  Newly diagnosed adenocarcinoma of GE junction  HISTORY OF PRESENT ILLNESS  ELAI Haynes is a 47 y.o. gentleman with history of asthma, presented to the ED on 05/27/2023 with complaints of epigastric burning abdominal pain, nausea, retching.  He also reported some dark tarry stools for 3 days prior to arrival.  He was previously seen in the ED at [facility] on 05/08/2023 with complaints of chest or right upper quadrant abdominal pain.  Workup was unremarkable at that time and he was sent home with Protonix and Carafate.  Since his symptoms persisted, he presented to the ED again.  In the ED, his hemoglobin was noted to be down to 10, compared to 13 previously.  With concern for upper GI bleed, was admitted for further evaluation and management.  Dr. Ewing Schlein performed upper GI endoscopy on 05/28/2023.  It showed partially obstructing, likely malignant esophageal tumor in the lower third of the esophagus.  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  Normal duodenum.  Pathology from GE junction mass came back positive for invasive adenocarcinoma, moderately to poorly differentiated.  Immunostains pending.  CT chest abdomen pelvis on 05/28/2023 showed abnormal wall thickening at the GE junction, compatible with patient's known carcinoma.  Prominent lymph nodes in the retrocrural region on the right side, adjacent to the hiatal hernia.  Abnormal low-density soft tissue in the gastrohepatic region, abutting the lesser curvature and proximal stomach, worrisome for metastatic disease.  Periportal, periceliac, gastrosplenic and left retroperitoneal lymphadenopathy, worrisome for metastatic involvement.   Hypodensities in the liver were also noted, worrisome for metastatic disease, largest 1 measuring 13 mm in the inferior right lobe.  Fat stranding surrounding the body and tail of pancreas, suggesting acute pancreatitis.  We were consulted today for additional recommendations given new diagnosis of GE junction adenocarcinoma.  Patient seen and evaluated.  I also discussed plan of care with patient's ex-spouse and his mother-in-law over the phone.  The patient has been experiencing difficulty swallowing for approximately one to two months, requiring him to chew food more thoroughly and often coughing up food that cannot be swallowed. The patient also reports dark colored stool, which was noticed a few weeks prior to hospital admission. The patient initially experienced chest pain and was given medication for indigestion or acid reflux. Following this, the patient noticed blood in his stool, which later turned dark. The patient denies any previous issues with dark or bloody stools. The patient lives with two children and has no known family history of cancer.  MEDICAL HISTORY Past Medical History:  Diagnosis Date   Asthma      SURGICAL HISTORY History reviewed. No pertinent surgical history.   ALLERGIES  No Known Allergies  FAMILY HISTORY  Family History  Problem Relation Age of Onset   Heart failure Father      SOCIAL HISTORY   Social History   Socioeconomic History   Marital status: Single    Spouse name: Not on file   Number of children: Not on file   Years of education: Not on file   Highest education level: Not on file  Occupational History   Not on file  Tobacco Use   Smoking status: Former    Current packs/day: 0.00  Types: Cigarettes    Quit date: 12/26/2012    Years since quitting: 10.4   Smokeless tobacco: Never  Vaping Use   Vaping status: Former   Quit date: 08/21/2018   Substances: Nicotine  Substance and Sexual Activity   Alcohol use: No   Drug use: No    Sexual activity: Not Currently  Other Topics Concern   Not on file  Social History Narrative   Not on file   Social Determinants of Health   Financial Resource Strain: Not on file  Food Insecurity: No Food Insecurity (05/27/2023)   Hunger Vital Sign    Worried About Running Out of Food in the Last Year: Never true    Ran Out of Food in the Last Year: Never true  Transportation Needs: No Transportation Needs (05/27/2023)   PRAPARE - Administrator, Civil Service (Medical): No    Lack of Transportation (Non-Medical): No  Physical Activity: Not on file  Stress: Not on file  Social Connections: Not on file  Intimate Partner Violence: Not At Risk (05/27/2023)   Humiliation, Afraid, Rape, and Kick questionnaire    Fear of Current or Ex-Partner: No    Emotionally Abused: No    Physically Abused: No    Sexually Abused: No    CURRENT MEDICATIONS   Current Outpatient Medications  Medication Instructions   albuterol (PROVENTIL) 2.5 mg, Nebulization, Every 6 hours PRN   albuterol (VENTOLIN HFA) 108 (90 Base) MCG/ACT inhaler 1-2 puffs, Inhalation, Every 4 hours PRN   pantoprazole (PROTONIX) 20 mg, Oral, Daily, For three weeks   sucralfate (CARAFATE) 1 g, Oral, 3 times daily with meals & bedtime     REVIEW OF SYSTEMS   Review of Systems - Oncology  All other pertinent review of systems is negative except as mentioned above in HPI  PHYSICAL EXAMINATION  ECOG PERFORMANCE STATUS: 1 - Symptomatic but completely ambulatory  Vitals:   05/28/23 2051 05/29/23 0554  BP: 100/72 116/78  Pulse: 93 81  Resp: 18 18  Temp: 99.7 F (37.6 C) 98.4 F (36.9 C)  SpO2: 92% 95%   Filed Weights   05/27/23 1051  Weight: 200 lb (90.7 kg)    Physical Exam Constitutional:      General: He is not in acute distress.    Appearance: Normal appearance.  HENT:     Head: Normocephalic and atraumatic.  Eyes:     General: No scleral icterus.    Conjunctiva/sclera: Conjunctivae  normal.  Cardiovascular:     Rate and Rhythm: Normal rate and regular rhythm.     Heart sounds: Normal heart sounds.  Pulmonary:     Effort: Pulmonary effort is normal.     Breath sounds: Normal breath sounds.  Abdominal:     General: There is no distension.  Musculoskeletal:     Right lower leg: No edema.     Left lower leg: No edema.  Neurological:     General: No focal deficit present.     Mental Status: He is alert and oriented to person, place, and time.  Psychiatric:        Mood and Affect: Mood normal.        Behavior: Behavior normal.        Thought Content: Thought content normal.     LABORATORY DATA:   I have reviewed the data as listed  Results for orders placed or performed during the hospital encounter of 05/27/23 (from the past 24 hour(s))  Hemoglobin and hematocrit,  blood   Collection Time: 05/28/23 12:53 PM  Result Value Ref Range   Hemoglobin 8.8 (L) 13.0 - 17.0 g/dL   HCT 16.1 (L) 09.6 - 04.5 %  Hemoglobin and hematocrit, blood   Collection Time: 05/28/23  4:10 PM  Result Value Ref Range   Hemoglobin 8.8 (L) 13.0 - 17.0 g/dL   HCT 40.9 (L) 81.1 - 91.4 %      RADIOGRAPHIC STUDIES:  I have personally reviewed the radiological images as listed and agree with the findings in the report.  CT CHEST ABDOMEN PELVIS W CONTRAST  Result Date: 05/28/2023 CLINICAL DATA:  Metastatic disease evaluation, gastroesophageal junction cancer. EXAM: CT CHEST, ABDOMEN, AND PELVIS WITH CONTRAST TECHNIQUE: Multidetector CT imaging of the chest, abdomen and pelvis was performed following the standard protocol during bolus administration of intravenous contrast. RADIATION DOSE REDUCTION: This exam was performed according to the departmental dose-optimization program which includes automated exposure control, adjustment of the mA and/or kV according to patient size and/or use of iterative reconstruction technique. CONTRAST:  OMNIPAQUE IOHEXOL 300 MG/ML  SOLN COMPARISON:  CT  chest 08/08/2017 FINDINGS: CT CHEST FINDINGS Cardiovascular: No significant vascular findings. Normal heart size. No pericardial effusion. Mediastinum/Nodes: Visualized thyroid gland is within normal limits. A small hiatal hernia is present with thickening at the gastroesophageal junction likely related to patient's known carcinoma. Remaining esophagus is nondilated. There are prominent lymph nodes adjacent to the hiatal hernia in the retrocrural region on the right measuring up to 7 mm. No other enlarged mediastinal, hilar or axillary lymph nodes are seen. Lungs/Pleura: There are atelectatic changes in the lingula and bilateral lower lobes. The lungs are otherwise clear. There is no pleural effusion or pneumothorax. There is a calcified granuloma in the right upper lobe. Musculoskeletal: No chest wall mass or suspicious bone lesions identified. CT ABDOMEN PELVIS FINDINGS Hepatobiliary: There are 2 hypodensities in the inferior right lobe of the liver measuring up to 13 mm. There is a subcentimeter hypodensity in the left lobe of the liver image 2/54. Liver parenchyma otherwise appears within normal limits. Gallbladder and bile ducts are within normal limits. There is fat stranding surrounding the body and tail of the pancreas. No ductal dilatation or fluid collection. Spleen: Normal in size without focal abnormality. Adrenals/Urinary Tract: Adrenal glands are unremarkable. Kidneys are normal, without renal calculi, focal lesion, or hydronephrosis. Bladder is unremarkable. Stomach/Bowel: There is abnormal wall thickening in the gastroesophageal junction. A small hiatal hernia is present. Findings are likely related to patient's known carcinoma. Distal stomach, small bowel, colon and appendix are otherwise within normal limits. Vascular/Lymphatic: The aorta and IVC are patent. Portal vein and superior mesenteric vein are patent. Splenic vein is grossly patent. Abnormal low-density soft tissues seen in the gastro  hepatic region abutting the lesser curvature and proximal stomach. The 2 largest areas measure 2.2 x 5.1 cm image 2/56 and 2.5 x 3.1 cm image 2/55. There is periportal lymphadenopathy with largest lymph node measuring up to 12 mm short axis image 2/60. There is paraceliac lymphadenopathy abutting the posterior body of the pancreas With largest lymph node measuring 3.7 x 1.7 cm image 2/60. Soft tissue nodular density seen in the gastrosplenic region measuring 2.1 x 1.3 cm image 2/54. There is a mesenteric nodule anterior to the gastric antrum measuring 1.5 x 1.4 cm image 2/61. There are additional numerous scattered nonenlarged peripancreatic and central mesenteric lymph nodes. There is left retroperitoneal lymphadenopathy at the level of the kidneys with largest lymph node measuring  14 mm short axis image 2/71. Reproductive: Prostate is unremarkable. Other: There is a small fat containing right inguinal and umbilical hernia. There is no ascites. Musculoskeletal: No acute fracture or focal osseous lesion. IMPRESSION: 1. Abnormal wall thickening at the gastroesophageal junction compatible with patient's known carcinoma. 2. Prominent lymph nodes in the retrocrural region on the right adjacent to the hiatal hernia. 3. Abnormal low-density soft tissue in the gastrohepatic region abutting the lesser curvature and proximal stomach worrisome for metastatic disease. 4. Periportal, paraceliac, gastrosplenic and left retroperitoneal lymphadenopathy worrisome for metastatic disease. 5. Hypodensities in the liver worrisome for metastatic disease. 6. Fat stranding surrounding the body and tail of the pancreas worrisome for acute pancreatitis. No fluid collection. Electronically Signed   By: Darliss Cheney M.D.   On: 05/28/2023 23:01   DG Chest 2 View  Result Date: 05/08/2023 CLINICAL DATA:  Chest pain, shortness of breath EXAM: CHEST - 2 VIEW COMPARISON:  None Available. FINDINGS: Cardiac and mediastinal contours are within  normal limits. Linear opacities in the left base, likely atelectasis. No other focal pulmonary opacity. No pleural effusion or pneumothorax. No acute osseous abnormality. IMPRESSION: No acute cardiopulmonary process. Electronically Signed   By: Wiliam Ke M.D.   On: 05/08/2023 12:10   US Abdomen Limited RUQ (LIVER/GB)  Result Date: 05/08/2023 CLINICAL DATA:  Abdominal pain EXAM: ULTRASOUND ABDOMEN LIMITED RIGHT UPPER QUADRANT COMPARISON:  None Available. FINDINGS: Gallbladder: 4 mm gallbladder polyp. No gallbladder wall thickening or pericholecystic fluid. Negative sonographic Murphy's sign. Common bile duct: Diameter: 3.7 mm Liver: No focal lesion identified. Within normal limits in parenchymal echogenicity. Portal vein is patent on color Doppler imaging with normal direction of blood flow towards the liver. Other: None. IMPRESSION: 1. No cholelithiasis or sonographic evidence for acute cholecystitis. 2. 4 mm gallbladder polyp. No imaging follow-up needed. Electronically Signed   By: Annia Belt M.D.   On: 05/08/2023 10:59    ASSESSMENT & PLAN:   47 y.o. gentleman with history of asthma, presented to the ED on 05/27/2023 with complaints of epigastric burning abdominal pain, nausea, retching.  He also reported some dark tarry stools for 3 days prior to arrival.  He was previously seen in the ED at [facility] on 05/08/2023 with complaints of chest or right upper quadrant abdominal pain.  Workup was unremarkable at that time and he was sent home with Protonix and Carafate.  Since his symptoms persisted, he presented to the ED again.  Workup during this hospital stay showed anemia with hemoglobin 10 on arrival, compared to 13 previously.  Hemoglobin was 8.8 yesterday.  Dr. Ewing Schlein performed upper GI endoscopy on 05/28/2023.  It showed partially obstructing, likely malignant esophageal tumor in the lower third of the esophagus.  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  Normal duodenum.  Pathology from  GE junction mass came back positive for invasive adenocarcinoma, moderately to poorly differentiated.  Immunostains pending.  CT chest abdomen pelvis on 05/28/2023 showed abnormal wall thickening at the GE junction, compatible with patient's known carcinoma.  Prominent lymph nodes in the retrocrural region on the right side, adjacent to the hiatal hernia.  Abnormal low-density soft tissue in the gastrohepatic region, abutting the lesser curvature and proximal stomach, worrisome for metastatic disease.  Periportal, periceliac, gastrosplenic and left retroperitoneal lymphadenopathy, worrisome for metastatic involvement.  Hypodensities in the liver were also noted, worrisome for metastatic disease, largest 1 measuring 13 mm in the inferior right lobe.  Fat stranding surrounding the body and tail of  pancreas, suggesting acute pancreatitis.  Discussed findings so far with the patient (and his family members over the phone).  Discussed diagnosis, tentative staging, prognosis, plan of care.  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  Reviewed NCCN guidelines.  Discussed treatment options.  He has at least locally advanced disease with possibility of liver metastatic disease.  We will arrange for PET scan in the outpatient setting.  Request placed today.   He will also need port-A-cath placement, either when he is here in the hospital or as soon as possible in the outpatient setting.  HER2/neu testing, MSI/MMR testing, PD-L1 testing is pending on the specimen.  Will await these results.  Briefly discussed systemic chemotherapy with FOLFOX plus immunotherapy with either nivolumab or pembrolizumab.  If HER2/neu positive, we will plan for HER2 targeted therapy in addition to the chemotherapy.  Treatment will be initiated in the outpatient setting.  Continue to monitor CBCD daily and transfuse as needed to maintain hemoglobin at least above 8.  He was advised to continue proton pump  inhibitors.  Pain management as per primary team.  Rest of care as per primary team and other specialties.  Thanks for the opportunity to participate in the care of this patient. Please contact me if there are any questions.  Upon discharge, please make appointment for patient to follow up with me at [facility].   Meryl Crutch, MD Medical Oncology and Hematology 05/29/2023 11:45 AM    This document was completed utilizing speech recognition software. Grammatical errors, random word insertions, pronoun errors, and incomplete sentences are an occasional consequence of this system due to software limitations, ambient noise, and hardware issues. Any formal questions or concerns about the content, text or information contained within the body of this dictation should be directly addressed to the provider for clarification.

## 2023-05-29 NOTE — Progress Notes (Signed)
Patient seen and case discussed with multiple family members and multiple questions were answered and we discussed his biopsy report and await oncology opinion on how to proceed and please let us know if we can be of any further assistance with this hospital stay otherwise I wished him well and good luck with his therapy and I will be available to help as needed as an outpatient

## 2023-05-29 NOTE — Plan of Care (Signed)

## 2023-05-29 NOTE — Discharge Instructions (Signed)
Healthcare.gov or call [phone]-Health [facility].

## 2023-05-30 DIAGNOSIS — C160 Malignant neoplasm of cardia: Secondary | ICD-10-CM | POA: Diagnosis not present

## 2023-05-30 MED ORDER — ENSURE ENLIVE PO LIQD: 237.0000 mL | Freq: Two times a day (BID) | ORAL | 12 refills | Status: DC

## 2023-05-30 MED ORDER — ONDANSETRON HCL 4 MG PO TABS: 4.0000 mg | ORAL_TABLET | Freq: Four times a day (QID) | ORAL | 0 refills | Status: DC | PRN

## 2023-05-30 MED ORDER — TRAZODONE HCL 50 MG PO TABS: 25.0000 mg | ORAL_TABLET | Freq: Every evening | ORAL | 0 refills | Status: DC | PRN

## 2023-05-30 MED ORDER — PANTOPRAZOLE SODIUM 40 MG PO TBEC: 40.0000 mg | DELAYED_RELEASE_TABLET | Freq: Every day | ORAL | 0 refills | Status: DC

## 2023-05-30 MED ORDER — OXYCODONE HCL 5 MG PO TABS: 5.0000 mg | ORAL_TABLET | ORAL | 0 refills | Status: DC | PRN

## 2023-05-30 NOTE — Progress Notes (Signed)
AVS reviewed w/ pt & brother in room -both verbalized an understanding. No other questions at this time. Pt  to call  cancer center tomorrow or on Monday to set up appointments for port placement & PET scan. Pt also to set up care w/a PCP- info on AVS. Pt's PIV removed by primary RN. AVS updated in pen w/ most recent meds. Pt dressing for d/c to home. Pt c/o nausea after oxycodone. Primary RN updated.

## 2023-05-30 NOTE — TOC Transition Note (Signed)
Transition of Care [facility]) - CM/SW Discharge Note   Patient Details  Name: Jesus Haynes MRN: [id number] Date of Birth: 05-Aug-1975  Transition of Care [facility]) CM/SW Contact:  Amada Jupiter, LCSW Phone Number: 05/30/2023, 9:41 AM   Clinical Narrative:     Met with pt this morning per MD request about possible Medicaid/ financial assistance for pt.  Reviewed with pt and family that the [facility] Financial Navigator team reviews all pt's admitted as "self pay" for Medicaid eligibility and they will reach out to patient if appropriate.   Explained to pt that he really needs to connect to one of the local [place] clinics to establish with PCP and provided full list of clinics - explained to him that this will allow him to receive community case management and pharmacy assistance as well.  Pt's mother on speaker phone and agrees and will assist to make sure appointment is made.  No further TOC needs.  Final next level of care: Home/Self Care Barriers to Discharge: Barriers Resolved   Patient Goals and CMS Choice CMS Medicare.gov Compare Post Acute Care list provided to:: Patient Choice offered to / list presented to : Patient  Discharge Placement                         Discharge Plan and Services Additional resources added to the After Visit Summary for     Discharge Planning Services: CM Consult Post Acute Care Choice: Resumption of Svcs/PTA Provider                               Social Determinants of Health (SDOH) Interventions SDOH Screenings   Food Insecurity: No Food Insecurity (05/27/2023)  Housing: Low Risk  (05/27/2023)  Transportation Needs: No Transportation Needs (05/27/2023)  Utilities: Not At Risk (05/27/2023)  Tobacco Use: Medium Risk (05/28/2023)     Readmission Risk Interventions     No data to display

## 2023-05-30 NOTE — Progress Notes (Signed)
Pt is waiting for zofran to work & then will continue dressing for d/c to home.Pt has 80 doses left on his albuterol inhaler. Pt asked if he could get another inhaler while he was still here. Currently, pt does not have medical insurance and states the prescription is expensive. This RN explained that the albuterol inhaler was a rescue inhaler and should not be used on a regular basis. Pt will follow up w. Hem/onc or at his PCP appointment to see if he should be on a maintenance inhaler.

## 2023-05-30 NOTE — Discharge Summary (Signed)
Physician Discharge Summary   Patient: Jesus Haynes MRN: [id number] DOB: Oct 03, 1975  Admit date:     05/27/2023  Discharge date: 05/30/23  Discharge Physician: Lanae Boast   PCP: Pcp, No   Recommendations at discharge:   Hematology oncology for further plan including PET scan, port placement and chemotherapy [facility]  Discharge Diagnoses: Principal Problem:   Adenocarcinoma of gastroesophageal junction (HCC) Active Problems:   Upper GI bleed   Melena   Anemia due to chronic blood loss  Resolved Problems:   * No resolved hospital problems. 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 Mainly asthma uses inhaler w/ several weeks history of dysphagia difficulty swallowing with vague abdominal pain came to the emergency room 11/6 given Protonix Carafate did not do well took some Pepto-Bismol, has 1 week history of dark tarry stool no history of goodies no history of significant outside food (only eats when he gets home after work) was dound to have new anemia with hemoglobin down to 8 from 13 some weakness and in the setting of dark tarry stools GI was consulted and s/p EGD 11/16> "Partially obstructing, likely malignant esophageal tumor was found in the lower third of the esophagus. Biopsied. 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 Biopsied" OK FOR soft diet per GI.Underwent staging CT chest abdomen pelvis with contrast> showed thickening of the gastroesophageal junction compatible with patient's known carcinoma prominent lymph nodes in retrocrural region, soft tissue in the gastrohepatic region worrisome for metastatic disease worrisome for lymphadenopathy metastatic disease and periportal/para celiac, gastrosplenic and left retroperitoneal, hypodensities in the liver worrisome for metastatic liver disease. Vitals remained stable hemoglobin stable at 8.8 g.  Pathology confirmed invasive adenocarcinoma moderate to poorly  differentiated.  Seen by oncology caseworker PCP appointment given with [facility].  Oncology has ordered outpatient PET scan and will be arranging port placement and further chemotherapy and treatment down the line patient is agreeable for discharge.  Discussed with oncology Dr. Arlana Pouch before discharge.  Assessment and Plan:  Adenocarcinoma of gastroesophageal junction Partially obstructing esophageal tumor Suspected metastasis to lymph nodes and liver: Discussed diagnosis with patient and family members at the bedside oncology has been consulted. Seen by oncology caseworker PCP appointment given with [facility].  Oncology has ordered outpatient PET scan and will be arranging port placement and further chemotherapy and treatment down the line patient is agreeable for discharge.  Discussed with oncology Dr. Arlana Pouch before discharg He did not like Pepcid, we will send him on Protonix and can take if he tolerates  Anemia of acute blood loss Anemia of malignancy: Hemoglobin holding 8-9 g range.  Monitor   Mild hyponatremia hypokalemia: Stable labs  Pain control - [place] Controlled Substance Reporting System database was reviewed. and patient was instructed, not to drive, operate heavy machinery, perform activities at heights, swimming or participation in water activities or provide baby-sitting services while on Pain, Sleep and Anxiety Medications; until their outpatient Physician has advised to do so again. Also recommended to not to take more than prescribed Pain, Sleep and Anxiety Medications.  Consultants: Oncology, GI Procedures performed: EGD and biopsy Disposition: Home Diet recommendation:  Diet Orders (From admission, onward)     Start     Ordered   05/29/23 1107  DIET SOFT Fluid consistency: Thin  Diet effective now       Question:  Fluid consistency:  Answer:  Thin   05/29/23 1106  DISCHARGE MEDICATION: Allergies as of 05/30/2023    No Known Allergies      Medication List     TAKE these medications    albuterol 108 (90 Base) MCG/ACT inhaler Commonly known as: VENTOLIN HFA Inhale 1-2 puffs into the lungs every 4 (four) hours as needed for wheezing or shortness of breath.   albuterol (5 MG/ML) 0.5% nebulizer solution Commonly known as: PROVENTIL Take 0.5 mLs (2.5 mg total) by nebulization every 6 (six) hours as needed for wheezing or shortness of breath.   feeding supplement Liqd Take 237 mLs by mouth 2 (two) times daily between meals.   ondansetron 4 MG tablet Commonly known as: ZOFRAN Take 1 tablet (4 mg total) by mouth every 6 (six) hours as needed for up to 30 doses for nausea.   oxyCODONE 5 MG immediate release tablet Commonly known as: Oxy IR/ROXICODONE Take 1 tablet (5 mg total) by mouth every 4 (four) hours as needed for up to 15 doses for moderate pain (pain score 4-6).   pantoprazole 40 MG tablet Commonly known as: Protonix Take 1 tablet (40 mg total) by mouth daily.   traZODone 50 MG tablet Commonly known as: DESYREL Take 0.5 tablets (25 mg total) by mouth at bedtime as needed for up to 30 doses for sleep.        Follow-up Information     [facility]. Schedule an appointment as soon as possible for a visit.   Contact information: [address] [place] [address] [phone]        Meryl Crutch, MD Follow up in 3 day(s).   Specialty: Oncology Why: call the office for port placement PET scan for further treatment plan Contact information: [address] [place] [address] [phone]                Discharge Exam: Ceasar Mons Weights   05/27/23 1051  Weight: 90.7 kg   Condition at discharge: fair  The results of significant diagnostics from this hospitalization (including imaging, microbiology, ancillary and laboratory) are listed below for reference.   Imaging Studies: CT CHEST ABDOMEN  PELVIS W CONTRAST  Result Date: 05/28/2023 CLINICAL DATA:  Metastatic disease evaluation, gastroesophageal junction cancer. EXAM: CT CHEST, ABDOMEN, AND PELVIS WITH CONTRAST TECHNIQUE: Multidetector CT imaging of the chest, abdomen and pelvis was performed following the standard protocol during bolus administration of intravenous contrast. RADIATION DOSE REDUCTION: This exam was performed according to the departmental dose-optimization program which includes automated exposure control, adjustment of the mA and/or kV according to patient size and/or use of iterative reconstruction technique. CONTRAST:  OMNIPAQUE IOHEXOL 300 MG/ML  SOLN COMPARISON:  CT chest 08/08/2017 FINDINGS: CT CHEST FINDINGS Cardiovascular: No significant vascular findings. Normal heart size. No pericardial effusion. Mediastinum/Nodes: Visualized thyroid gland is within normal limits. A small hiatal hernia is present with thickening at the gastroesophageal junction likely related to patient's known carcinoma. Remaining esophagus is nondilated. There are prominent lymph nodes adjacent to the hiatal hernia in the retrocrural region on the right measuring up to 7 mm. No other enlarged mediastinal, hilar or axillary lymph nodes are seen. Lungs/Pleura: There are atelectatic changes in the lingula and bilateral lower lobes. The lungs are otherwise clear. There is no pleural effusion or pneumothorax. There is a calcified granuloma in the right upper lobe. Musculoskeletal: No chest wall mass or suspicious bone lesions identified. CT ABDOMEN PELVIS FINDINGS Hepatobiliary: There are 2 hypodensities in the inferior right lobe  of the liver measuring up to 13 mm. There is a subcentimeter hypodensity in the left lobe of the liver image 2/54. Liver parenchyma otherwise appears within normal limits. Gallbladder and bile ducts are within normal limits. There is fat stranding surrounding the body and tail of the pancreas. No ductal dilatation or fluid  collection. Spleen: Normal in size without focal abnormality. Adrenals/Urinary Tract: Adrenal glands are unremarkable. Kidneys are normal, without renal calculi, focal lesion, or hydronephrosis. Bladder is unremarkable. Stomach/Bowel: There is abnormal wall thickening in the gastroesophageal junction. A small hiatal hernia is present. Findings are likely related to patient's known carcinoma. Distal stomach, small bowel, colon and appendix are otherwise within normal limits. Vascular/Lymphatic: The aorta and IVC are patent. Portal vein and superior mesenteric vein are patent. Splenic vein is grossly patent. Abnormal low-density soft tissues seen in the gastro hepatic region abutting the lesser curvature and proximal stomach. The 2 largest areas measure 2.2 x 5.1 cm image 2/56 and 2.5 x 3.1 cm image 2/55. There is periportal lymphadenopathy with largest lymph node measuring up to 12 mm short axis image 2/60. There is paraceliac lymphadenopathy abutting the posterior body of the pancreas With largest lymph node measuring 3.7 x 1.7 cm image 2/60. Soft tissue nodular density seen in the gastrosplenic region measuring 2.1 x 1.3 cm image 2/54. There is a mesenteric nodule anterior to the gastric antrum measuring 1.5 x 1.4 cm image 2/61. There are additional numerous scattered nonenlarged peripancreatic and central mesenteric lymph nodes. There is left retroperitoneal lymphadenopathy at the level of the kidneys with largest lymph node measuring 14 mm short axis image 2/71. Reproductive: Prostate is unremarkable. Other: There is a small fat containing right inguinal and umbilical hernia. There is no ascites. Musculoskeletal: No acute fracture or focal osseous lesion. IMPRESSION: 1. Abnormal wall thickening at the gastroesophageal junction compatible with patient's known carcinoma. 2. Prominent lymph nodes in the retrocrural region on the right adjacent to the hiatal hernia. 3. Abnormal low-density soft tissue in the  gastrohepatic region abutting the lesser curvature and proximal stomach worrisome for metastatic disease. 4. Periportal, paraceliac, gastrosplenic and left retroperitoneal lymphadenopathy worrisome for metastatic disease. 5. Hypodensities in the liver worrisome for metastatic disease. 6. Fat stranding surrounding the body and tail of the pancreas worrisome for acute pancreatitis. No fluid collection. Electronically Signed   By: Darliss Cheney M.D.   On: 05/28/2023 23:01   DG Chest 2 View  Result Date: 05/08/2023 CLINICAL DATA:  Chest pain, shortness of breath EXAM: CHEST - 2 VIEW COMPARISON:  None Available. FINDINGS: Cardiac and mediastinal contours are within normal limits. Linear opacities in the left base, likely atelectasis. No other focal pulmonary opacity. No pleural effusion or pneumothorax. No acute osseous abnormality. IMPRESSION: No acute cardiopulmonary process. Electronically Signed   By: Wiliam Ke M.D.   On: 05/08/2023 12:10   US Abdomen Limited RUQ (LIVER/GB)  Result Date: 05/08/2023 CLINICAL DATA:  Abdominal pain EXAM: ULTRASOUND ABDOMEN LIMITED RIGHT UPPER QUADRANT COMPARISON:  None Available. FINDINGS: Gallbladder: 4 mm gallbladder polyp. No gallbladder wall thickening or pericholecystic fluid. Negative sonographic Murphy's sign. Common bile duct: Diameter: 3.7 mm Liver: No focal lesion identified. Within normal limits in parenchymal echogenicity. Portal vein is patent on color Doppler imaging with normal direction of blood flow towards the liver. Other: None. IMPRESSION: 1. No cholelithiasis or sonographic evidence for acute cholecystitis. 2. 4 mm gallbladder polyp. No imaging follow-up needed. Electronically Signed   By: Annia Belt M.D.   On: 05/08/2023  10:59    Microbiology: Results for orders placed or performed during the hospital encounter of 07/25/20  Group A Strep by PCR     Status: None   Collection Time: 07/25/20 11:32 AM   Specimen: Throat; Sterile Swab  Result Value  Ref Range Status   Group A Strep by PCR NOT DETECTED NOT DETECTED Final    Comment: Performed at [facility], [address], [place], [place] [address]    Labs: CBC: Recent Labs  Lab 05/27/23 1102 05/27/23 1700 05/27/23 2320 05/28/23 0441 05/28/23 1253 05/28/23 1610  WBC 9.5  --   --  7.7  --   --   HGB 10.1* 9.2* 9.2* 8.8* 8.8* 8.8*  HCT 31.0* 29.1* 27.6* 26.5* 28.1* 27.4*  MCV 84.2  --   --  83.9  --   --   PLT 352  --   --  290  --   --    Basic Metabolic Panel: Recent Labs  Lab 05/27/23 1102 05/28/23 0441  NA 137 134*  K 4.0 3.4*  CL 106 103  CO2 23 23  GLUCOSE 121* 112*  BUN 21* 16  CREATININE 0.92 0.88  CALCIUM 8.7* 8.3*   Liver Function Tests: Recent Labs  Lab 05/27/23 1102  AST 17  ALT 23  ALKPHOS 51  BILITOT 0.3  PROT 6.8  ALBUMIN 3.5   CBG: No results for input(s): "GLUCAP" in the last 168 hours.  Discharge time spent: greater than 30 minutes.  Signed: Lanae Boast, MD [facility] 05/30/2023

## 2023-05-31 ENCOUNTER — Other Ambulatory Visit: Payer: Self-pay | Admitting: Oncology

## 2023-05-31 ENCOUNTER — Telehealth: Payer: Self-pay

## 2023-05-31 ENCOUNTER — Other Ambulatory Visit: Payer: Self-pay

## 2023-05-31 ENCOUNTER — Encounter (HOSPITAL_COMMUNITY): Payer: Self-pay | Admitting: Gastroenterology

## 2023-05-31 DIAGNOSIS — G893 Neoplasm related pain (acute) (chronic): Secondary | ICD-10-CM

## 2023-05-31 MED ORDER — OXYCODONE HCL 5 MG PO TABS: 5.0000 mg | ORAL_TABLET | Freq: Four times a day (QID) | ORAL | 0 refills | Status: DC | PRN

## 2023-05-31 NOTE — Progress Notes (Signed)
Oxycodone refill sent for 90 tablets to last at least 2 weeks, before we can see him in clinic.

## 2023-05-31 NOTE — Telephone Encounter (Signed)
Patient called and left message on switchboard about getting port placement and CT scan scheduled.  He is a new patient recently discharged and Jesus Haynes at [phone] called on his behalf. I do not see her listed as a contact in his chart but there is a Sharyl Nimrod listed as a contact although she has a different number.  Lorayne Marek, RN

## 2023-06-03 ENCOUNTER — Telehealth: Payer: Self-pay | Admitting: Medical Oncology

## 2023-06-03 ENCOUNTER — Other Ambulatory Visit: Payer: Self-pay

## 2023-06-03 ENCOUNTER — Telehealth: Payer: Self-pay | Admitting: Oncology

## 2023-06-03 DIAGNOSIS — C160 Malignant neoplasm of cardia: Secondary | ICD-10-CM

## 2023-06-03 NOTE — Telephone Encounter (Signed)
Medication refill requested 

## 2023-06-05 LAB — SURGICAL PATHOLOGY

## 2023-06-06 LAB — MOLECULAR PATHOLOGY

## 2023-06-07 ENCOUNTER — Ambulatory Visit (HOSPITAL_COMMUNITY)
Admission: RE | Admit: 2023-06-07 | Discharge: 2023-06-07 | Disposition: A | Discharge status: Home / Self Care | Payer: Self-pay | Source: Ambulatory Visit | Attending: Oncology | Admitting: Oncology

## 2023-06-07 DIAGNOSIS — C160 Malignant neoplasm of cardia: Secondary | ICD-10-CM | POA: Insufficient documentation

## 2023-06-07 LAB — GLUCOSE, CAPILLARY: Glucose-Capillary: 111 mg/dL — ABNORMAL HIGH (ref 70–99)

## 2023-06-07 MED ORDER — FLUDEOXYGLUCOSE F - 18 (FDG) INJECTION
9.5000 | Freq: Once | INTRAVENOUS | Status: AC
  Administered 2023-06-07: 9.5 via INTRAVENOUS

## 2023-06-11 ENCOUNTER — Other Ambulatory Visit: Payer: Self-pay | Admitting: Urology

## 2023-06-11 NOTE — H&P (Signed)
Chief Complaint: Patient was seen in consultation today for Port-A-Cath placement for management of gastroesophageal adenocarcinoma, at the request of Dr. Meryl Crutch  Supervising Physician: Richarda Overlie  Patient Status: [facility] - Out-pt  History of Present Illness: ILIR GUYOT is a 47 y.o. male   FULL Code status per patient.  ABHISHEK HUNSICKER is a 47 y.o. male with history of asthma, who presented to the ED on 05/27/2023 with complaints of epigastric burning and abdominal pain, with nausea, retching and tarry stools.  He was previously seen in the ED at [facility] on 05/08/2023 with complaints of chest or right upper quadrant abdominal pain.  Workup was unremarkable at that time and he was sent home with Protonix and Carafate.     In the ED, his hemoglobin was noted to be down, and he was admitted for further evaluation and management of possible GI bleed. Dr. Ewing Schlein performed upper GI endoscopy on 05/28/2023.  It showed partially obstructing, likely malignant esophageal tumor in the lower third of the esophagus.  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 Pathology from GE junction mass came back positive for invasive adenocarcinoma, moderately to poorly differentiated.     CT chest abdomen pelvis on 05/28/2023 showed abnormal wall thickening at the GE junction, compatible with patient's known carcinoma with multiple concerns for metastasis.  Fat stranding surrounding the body and tail of pancreas, suggesting acute pancreatitis. Patient was discharged stable on 11/28.  Outpatient PET scan on 12/06: IMPRESSION: 1. Large hypermetabolic mass involving the distal esophagus and proximal stomach consistent with known primary esophageal carcinoma. 2. Multiple hypermetabolic retroperitoneal and lower mediastinal lymph nodes as described, consistent with locally advanced disease. Suspected direct tumor extension into the pancreatic body. Hypermetabolic node or peritoneal implant within the  omentum. 3. No evidence of metastatic disease within the neck, chest or pelvis. No osseous abnormalities.  IR was requested for Port-A-Cath placement for chemotherapy administration. IR reviewed and approved. Patient is scheduled for same in IR today.   Past Medical History:  Diagnosis Date   Asthma     Past Surgical History:  Procedure Laterality Date   BIOPSY  05/28/2023   Procedure: BIOPSY;  Surgeon: Vida Rigger, MD;  Location: [facility];  Service: Gastroenterology;;   ESOPHAGOGASTRODUODENOSCOPY (EGD) WITH PROPOFOL N/A 05/28/2023   Procedure: ESOPHAGOGASTRODUODENOSCOPY (EGD) WITH PROPOFOL;  Surgeon: Vida Rigger, MD;  Location: [facility];  Service: Gastroenterology;  Laterality: N/A;    Allergies: Patient has no known allergies.  Medications: Prior to Admission medications   Medication Sig Start Date End Date Taking? Authorizing Provider  albuterol (PROVENTIL) (5 MG/ML) 0.5% nebulizer solution Take 0.5 mLs (2.5 mg total) by nebulization every 6 (six) hours as needed for wheezing or shortness of breath. 04/28/23   Roxy Horseman, PA-C  albuterol (VENTOLIN HFA) 108 (90 Base) MCG/ACT inhaler Inhale 1-2 puffs into the lungs every 4 (four) hours as needed for wheezing or shortness of breath. 06/20/22   Mare Ferrari, PA-C  feeding supplement (ENSURE ENLIVE / ENSURE PLUS) LIQD Take 237 mLs by mouth 2 (two) times daily between meals. 05/30/23   Lanae Boast, MD  ondansetron (ZOFRAN) 4 MG tablet Take 1 tablet (4 mg total) by mouth every 6 (six) hours as needed for up to 30 doses for nausea. 05/30/23   Lanae Boast, MD  oxyCODONE (OXY IR/ROXICODONE) 5 MG immediate release tablet Take 1 tablet (5 mg total) by mouth every 6 (six) hours as needed for severe pain (pain score 7-10). 05/31/23  Pasam, Avinash, MD  pantoprazole (PROTONIX) 40 MG tablet Take 1 tablet (40 mg total) by mouth daily. 05/30/23 06/29/23  Lanae Boast, MD  traZODone (DESYREL) 50 MG tablet Take 0.5 tablets (25 mg total)  by mouth at bedtime as needed for up to 30 doses for sleep. 05/30/23   Lanae Boast, MD  cetirizine (ZYRTEC) 10 MG tablet Take 1 tablet (10 mg total) by mouth daily. Patient not taking: Reported on 01/26/2019 02/23/18 07/26/20  Maczis, Elmer Sow, PA-C  fluticasone [facility]) 50 MCG/ACT nasal spray Place 1-2 sprays into both nostrils daily. Patient not taking: Reported on 01/26/2019 02/23/18 07/26/20  Maczis, Elmer Sow, PA-C     Family History  Problem Relation Age of Onset   Heart failure Father     Social History   Socioeconomic History   Marital status: Single    Spouse name: Not on file   Number of children: Not on file   Years of education: Not on file   Highest education level: Not on file  Occupational History   Not on file  Tobacco Use   Smoking status: Former    Current packs/day: 0.00    Types: Cigarettes    Quit date: 12/26/2012    Years since quitting: 10.4   Smokeless tobacco: Never  Vaping Use   Vaping status: Former   Quit date: 08/21/2018   Substances: Nicotine  Substance and Sexual Activity   Alcohol use: No   Drug use: No   Sexual activity: Not Currently  Other Topics Concern   Not on file  Social History Narrative   Not on file   Social Determinants of Health   Financial Resource Strain: Not on file  Food Insecurity: No Food Insecurity (05/27/2023)   Hunger Vital Sign    Worried About Running Out of Food in the Last Year: Never true    Ran Out of Food in the Last Year: Never true  Transportation Needs: No Transportation Needs (05/27/2023)   PRAPARE - Administrator, Civil Service (Medical): No    Lack of Transportation (Non-Medical): No  Physical Activity: Not on file  Stress: Not on file  Social Connections: Not on file    Review of Systems: A 12 point ROS discussed and pertinent positives are indicated in the HPI above.  All other systems are negative.  Review of Systems  Vital Signs: There were no vitals taken for this  visit.    Physical Exam  Imaging: NM PET Image Initial (PI) Skull Base To Thigh  Result Date: 06/07/2023 CLINICAL DATA:  Initial treatment strategy for newly diagnosed adenocarcinoma at the GE junction. Possible hepatic metastatic disease. EXAM: NUCLEAR MEDICINE PET SKULL BASE TO THIGH TECHNIQUE: 9.5 mCi F-18 FDG was injected intravenously. Full-ring PET imaging was performed from the skull base to thigh after the radiotracer. CT data was obtained and used for attenuation correction and anatomic localization. Fasting blood glucose: 111 mg/dl COMPARISON:  CT of the chest, abdomen and pelvis 05/28/2023. FINDINGS: Mediastinal blood pool activity: SUV max 2.8 NECK: No hypermetabolic cervical lymph nodes are identified.Fairly symmetric activity within the lymphoid tissue of Waldeyer's ring is within physiologic limits. No suspicious activity identified within the pharyngeal mucosal space. Incidental CT findings: none CHEST: As seen on recent CT, there is a large mass involving the distal esophagus with extension into the gastric cardia which is hypermetabolic. There are several hypermetabolic distal paraesophageal and right paratracheal lymph nodes, including a 1.0 cm left paraesophageal node on image 82/4 (SUV  max 6.2) and a 1.0 cm right retrocrural node on image 92/4 (SUV max 8.1). No hypermetabolic upper mediastinal, hilar or axillary lymph nodes. Low-level hilar activity is within physiologic limits. No hypermetabolic pulmonary activity or suspicious nodularity. Incidental CT findings: Stable small calcified right upper lobe granuloma. ABDOMEN/PELVIS: As above, large hypermetabolic mass extending from the distal esophagus into the proximal stomach. This has an SUV max of 27.7 and the hypermetabolic activity spans an area measuring up to 8.1 x 8.1 cm transverse. There is direct extension of hypermetabolic activity into the porta hepatis, although no discrete hypermetabolic liver lesions are identified. There  are multiple hypermetabolic lymph nodes in the gastrohepatic ligament with inferior extension and probable direct involvement of the pancreas. There is hypermetabolic activity throughout the pancreas, suspicious for tumor involvement. Additional hypermetabolic abdominal lymph nodes include a 1.9 cm node at the splenic hilum on image 92/4 (SUV max 12.1) and a lymph node or peritoneal implants within the omentum measuring 2.1 x 1.1 cm on image 105/4 (SUV max 15.6). There are hypermetabolic retroperitoneal lymph nodes extending inferior to the renal veins (SUV max 18.5). No hypermetabolic lymph nodes identified in the pelvis. There is no adrenal mass. Incidental CT findings: Mild iliac atherosclerosis.  No ascites. SKELETON: There is no hypermetabolic activity to suggest osseous metastatic disease. Incidental CT findings: none IMPRESSION: 1. Large hypermetabolic mass involving the distal esophagus and proximal stomach consistent with known primary esophageal carcinoma. 2. Multiple hypermetabolic retroperitoneal and lower mediastinal lymph nodes as described, consistent with locally advanced disease. Suspected direct tumor extension into the pancreatic body. Hypermetabolic node or peritoneal implant within the omentum. 3. No evidence of metastatic disease within the neck, chest or pelvis. No osseous abnormalities. Electronically Signed   By: Carey Bullocks M.D.   On: 06/07/2023 17:48   CT CHEST ABDOMEN PELVIS W CONTRAST  Result Date: 05/28/2023 CLINICAL DATA:  Metastatic disease evaluation, gastroesophageal junction cancer. EXAM: CT CHEST, ABDOMEN, AND PELVIS WITH CONTRAST TECHNIQUE: Multidetector CT imaging of the chest, abdomen and pelvis was performed following the standard protocol during bolus administration of intravenous contrast. RADIATION DOSE REDUCTION: This exam was performed according to the departmental dose-optimization program which includes automated exposure control, adjustment of the mA and/or kV  according to patient size and/or use of iterative reconstruction technique. CONTRAST:  OMNIPAQUE IOHEXOL 300 MG/ML  SOLN COMPARISON:  CT chest 08/08/2017 FINDINGS: CT CHEST FINDINGS Cardiovascular: No significant vascular findings. Normal heart size. No pericardial effusion. Mediastinum/Nodes: Visualized thyroid gland is within normal limits. A small hiatal hernia is present with thickening at the gastroesophageal junction likely related to patient's known carcinoma. Remaining esophagus is nondilated. There are prominent lymph nodes adjacent to the hiatal hernia in the retrocrural region on the right measuring up to 7 mm. No other enlarged mediastinal, hilar or axillary lymph nodes are seen. Lungs/Pleura: There are atelectatic changes in the lingula and bilateral lower lobes. The lungs are otherwise clear. There is no pleural effusion or pneumothorax. There is a calcified granuloma in the right upper lobe. Musculoskeletal: No chest wall mass or suspicious bone lesions identified. CT ABDOMEN PELVIS FINDINGS Hepatobiliary: There are 2 hypodensities in the inferior right lobe of the liver measuring up to 13 mm. There is a subcentimeter hypodensity in the left lobe of the liver image 2/54. Liver parenchyma otherwise appears within normal limits. Gallbladder and bile ducts are within normal limits. There is fat stranding surrounding the body and tail of the pancreas. No ductal dilatation or fluid collection. Spleen:  Normal in size without focal abnormality. Adrenals/Urinary Tract: Adrenal glands are unremarkable. Kidneys are normal, without renal calculi, focal lesion, or hydronephrosis. Bladder is unremarkable. Stomach/Bowel: There is abnormal wall thickening in the gastroesophageal junction. A small hiatal hernia is present. Findings are likely related to patient's known carcinoma. Distal stomach, small bowel, colon and appendix are otherwise within normal limits. Vascular/Lymphatic: The aorta and IVC are patent.  Portal vein and superior mesenteric vein are patent. Splenic vein is grossly patent. Abnormal low-density soft tissues seen in the gastro hepatic region abutting the lesser curvature and proximal stomach. The 2 largest areas measure 2.2 x 5.1 cm image 2/56 and 2.5 x 3.1 cm image 2/55. There is periportal lymphadenopathy with largest lymph node measuring up to 12 mm short axis image 2/60. There is paraceliac lymphadenopathy abutting the posterior body of the pancreas With largest lymph node measuring 3.7 x 1.7 cm image 2/60. Soft tissue nodular density seen in the gastrosplenic region measuring 2.1 x 1.3 cm image 2/54. There is a mesenteric nodule anterior to the gastric antrum measuring 1.5 x 1.4 cm image 2/61. There are additional numerous scattered nonenlarged peripancreatic and central mesenteric lymph nodes. There is left retroperitoneal lymphadenopathy at the level of the kidneys with largest lymph node measuring 14 mm short axis image 2/71. Reproductive: Prostate is unremarkable. Other: There is a small fat containing right inguinal and umbilical hernia. There is no ascites. Musculoskeletal: No acute fracture or focal osseous lesion. IMPRESSION: 1. Abnormal wall thickening at the gastroesophageal junction compatible with patient's known carcinoma. 2. Prominent lymph nodes in the retrocrural region on the right adjacent to the hiatal hernia. 3. Abnormal low-density soft tissue in the gastrohepatic region abutting the lesser curvature and proximal stomach worrisome for metastatic disease. 4. Periportal, paraceliac, gastrosplenic and left retroperitoneal lymphadenopathy worrisome for metastatic disease. 5. Hypodensities in the liver worrisome for metastatic disease. 6. Fat stranding surrounding the body and tail of the pancreas worrisome for acute pancreatitis. No fluid collection. Electronically Signed   By: Darliss Cheney M.D.   On: 05/28/2023 23:01    Labs:  CBC: Recent Labs    05/08/23 1050  05/27/23 1102 05/27/23 1700 05/27/23 2320 05/28/23 0441 05/28/23 1253 05/28/23 1610  WBC 8.8 9.5  --   --  7.7  --   --   HGB 13.1 10.1*   < > 9.2* 8.8* 8.8* 8.8*  HCT 40.2 31.0*   < > 27.6* 26.5* 28.1* 27.4*  PLT 292 352  --   --  290  --   --    < > = values in this interval not displayed.    COAGS: No results for input(s): "INR", "APTT" in the last 8760 hours.  BMP: Recent Labs    05/08/23 1050 05/27/23 1102 05/28/23 0441  NA 138 137 134*  K 3.9 4.0 3.4*  CL 105 106 103  CO2 25 23 23   GLUCOSE 95 121* 112*  BUN 7 21* 16  CALCIUM 8.8* 8.7* 8.3*  CREATININE 0.85 0.92 0.88  GFRNONAA >60 >60 >60    LIVER FUNCTION TESTS: Recent Labs    05/08/23 1050 05/27/23 1102  BILITOT 0.5 0.3  AST 18 17  ALT 22 23  ALKPHOS 60 51  PROT 6.6 6.8  ALBUMIN 3.8 3.5    TUMOR MARKERS: No results for input(s): "AFPTM", "CEA", "CA199", "CHROMGRNA" in the last 8760 hours.  Assessment and Plan:  QUINLIN TOPPI is a 47 y.o. male with history of asthma, who presented to the  ED on 05/27/2023 with complaints of epigastric burning and abdominal pain, with nausea, retching and tarry stools.  His hemoglobin was noted to be down, and he was admitted for further evaluation and management of possible GI bleed. Dr. Ewing Schlein performed upper GI endoscopy on 05/28/2023.  It showed partially obstructing, likely malignant esophageal tumor in the lower third of the esophagus.  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 Pathology from GE junction mass came back positive for invasive adenocarcinoma, moderately to poorly differentiated.    IR was requested for Port-A-Cath placement for chemotherapy administration. Patient presents  for same in IR today.  Risks and benefits of image guided Port-A-Cath placement was discussed with the patient including, but not limited to bleeding, infection, pneumothorax, or fibrin sheath development and need for additional procedures.  All of the patient's questions were  answered, patient is agreeable to proceed. Consent signed and in chart.   Thank you for this interesting consult.  I greatly enjoyed meeting HOLDEN SOUTHARDS and look forward to participating in their care.  A copy of this report was sent to the requesting provider on this date.  Electronically Signed: Sable Feil, PA-C 06/11/2023, 3:39 PM   I spent a total of  30 Minutes   in face to face in clinical consultation, greater than 50% of which was counseling/coordinating care for Port-A-Cath placement.

## 2023-06-12 ENCOUNTER — Encounter: Payer: Self-pay | Admitting: Nurse Practitioner

## 2023-06-12 ENCOUNTER — Other Ambulatory Visit (HOSPITAL_COMMUNITY): Payer: Self-pay

## 2023-06-12 ENCOUNTER — Ambulatory Visit (INDEPENDENT_AMBULATORY_CARE_PROVIDER_SITE_OTHER): Payer: Medicaid Other | Admitting: Nurse Practitioner

## 2023-06-12 ENCOUNTER — Telehealth: Payer: Self-pay | Admitting: Nurse Practitioner

## 2023-06-12 ENCOUNTER — Other Ambulatory Visit: Payer: Self-pay | Admitting: Nurse Practitioner

## 2023-06-12 VITALS — BP 114/72 | HR 106 | Temp 97.4°F | Wt 174.4 lb

## 2023-06-12 DIAGNOSIS — D649 Anemia, unspecified: Secondary | ICD-10-CM | POA: Diagnosis not present

## 2023-06-12 DIAGNOSIS — Z139 Encounter for screening, unspecified: Secondary | ICD-10-CM

## 2023-06-12 DIAGNOSIS — R1084 Generalized abdominal pain: Secondary | ICD-10-CM

## 2023-06-12 DIAGNOSIS — C160 Malignant neoplasm of cardia: Secondary | ICD-10-CM

## 2023-06-12 MED ORDER — ONDANSETRON 4 MG PO TBDP
4.0000 mg | ORAL_TABLET | Freq: Three times a day (TID) | ORAL | 0 refills | Status: DC | PRN
  Filled 2023-06-12: qty 60, 20d supply, fill #0

## 2023-06-12 MED ORDER — OXYCODONE HCL 5 MG/5ML PO SOLN: 5.0000 mg | ORAL | 0 refills | Status: DC | PRN

## 2023-06-12 NOTE — Patient Instructions (Addendum)
1. Anemia, unspecified type  - CBC - Comprehensive metabolic panel   Follow up:  Follow up in 3 months

## 2023-06-12 NOTE — Progress Notes (Signed)
PDMP reviewed.  Patient was recently prescribed Oxycodone tablets but is unable to swallow these due to esophageal tumor.  Will change prescription to liquid oxycodone. 5 day supply given to patient.

## 2023-06-12 NOTE — Telephone Encounter (Signed)
Notified patient that we have sent in liquid oxycodone to the pharmacy. Advised to stop taking the pills and switch to the liquid form.

## 2023-06-12 NOTE — Progress Notes (Unsigned)
Per Archie Patten, call pharmacy to ask them if we could get his Oxycodone in liquid form due to him not being able to swallow the pill. Spoke to Pharmacy, last date he picked up medication was 05/31/2023 at 1:50 pm. She transferred me and nobody came to the phone after 20 mins of waiting I will try again later. CB Margarito Courser she stated that she will just send it there. CB

## 2023-06-12 NOTE — Progress Notes (Unsigned)
Subjective   Patient ID: Jesus Haynes, male    DOB: [birth year], 47 y.o.   MRN: [id number]  Chief Complaint  Patient presents with   Establish Care    Stage 3 cancer    Referring provider: No ref. provider found  GUNNAR RAZZAK is a 47 y.o. male with Past Medical History: No date: Asthma  Recent significant events:  Hospital admission 05/27/2023  Hospital summary:  Adenocarcinoma of gastroesophageal junction Partially obstructing esophageal tumor Suspected metastasis to lymph nodes and liver: Discussed diagnosis with patient and family members at the bedside oncology has been consulted. Seen by oncology caseworker PCP appointment given with [facility].  Oncology has ordered outpatient PET scan and will be arranging port placement and further chemotherapy and treatment down the line patient is agreeable for discharge.  Discussed with oncology Dr. Arlana Pouch before discharg He did not like Pepcid, we will send him on Protonix and can take if he tolerates   Anemia of acute blood loss Anemia of malignancy: Hemoglobin holding 8-9 g range.  Monitor   Mild hyponatremia hypokalemia: Stable labs   HPI  Patient presents for hospital follow-up today.  Please see notes above.  Patient does have an appointment to get port placed tomorrow.  He does have an upcoming appointment to establish with oncology on Friday.  They should be discussing a treatment plan for him on Friday.  Patient states that he is not able to eat or drink well currently due to feeling that the food is not passing through his esophagus.  He is requesting that his pain medicine be switched to liquid.  We will switch this for him today.  We discussed that he will need to stop taking the current pain pills that he has and only take the liquid.  Patient is having significant abdominal pain and states that it is hard for him to stay comfortable. Denies f/c/s, n/v/d, hemoptysis, PND, leg swelling Denies chest pain  or edema     No Known Allergies   There is no immunization history on file for this patient.  Tobacco History: Social History   Tobacco Use  Smoking Status Former   Current packs/day: 0.00   Types: Cigarettes   Quit date: 12/26/2012   Years since quitting: 10.4  Smokeless Tobacco Never   Counseling given: Not Answered   Outpatient Encounter Medications as of 06/12/2023  Medication Sig   albuterol (PROVENTIL) (5 MG/ML) 0.5% nebulizer solution Take 0.5 mLs (2.5 mg total) by nebulization every 6 (six) hours as needed for wheezing or shortness of breath.   albuterol (VENTOLIN HFA) 108 (90 Base) MCG/ACT inhaler Inhale 1-2 puffs into the lungs every 4 (four) hours as needed for wheezing or shortness of breath.   feeding supplement (ENSURE ENLIVE / ENSURE PLUS) LIQD Take 237 mLs by mouth 2 (two) times daily between meals.   ondansetron (ZOFRAN) 4 MG tablet Take 1 tablet (4 mg total) by mouth every 6 (six) hours as needed for up to 30 doses for nausea.   ondansetron (ZOFRAN-ODT) 4 MG disintegrating tablet Dissolve 1 tablet (4 mg total) in mouth every 8 (eight) hours as needed.   pantoprazole (PROTONIX) 40 MG tablet Take 1 tablet (40 mg total) by mouth daily.   traZODone (DESYREL) 50 MG tablet Take 0.5 tablets (25 mg total) by mouth at bedtime as needed for up to 30 doses for sleep.   [DISCONTINUED] oxyCODONE (OXY IR/ROXICODONE) 5 MG immediate release tablet Take 1 tablet (5 mg total)  by mouth every 6 (six) hours as needed for severe pain (pain score 7-10).   [DISCONTINUED] cetirizine (ZYRTEC) 10 MG tablet Take 1 tablet (10 mg total) by mouth daily. (Patient not taking: Reported on 01/26/2019)   [DISCONTINUED] fluticasone (FLONASE) 50 MCG/ACT nasal spray Place 1-2 sprays into both nostrils daily. (Patient not taking: Reported on 01/26/2019)   No facility-administered encounter medications on file as of 06/12/2023.    Review of Systems  Review of Systems  Constitutional: Negative.    HENT: Negative.    Cardiovascular: Negative.   Gastrointestinal: Negative.   Allergic/Immunologic: Negative.   Neurological: Negative.   Psychiatric/Behavioral: Negative.       Objective:   BP 114/72   Pulse (!) 106   Temp (!) 97.4 F (36.3 C)   Wt 174 lb 6.4 oz (79.1 kg)   SpO2 98%   BMI 28.15 kg/m   Wt Readings from Last 5 Encounters:  06/13/23 174 lb (78.9 kg)  06/12/23 174 lb 6.4 oz (79.1 kg)  05/27/23 200 lb (90.7 kg)  05/08/23 200 lb (90.7 kg)  04/28/23 200 lb (90.7 kg)     Physical Exam Vitals and nursing note reviewed.  Constitutional:      General: He is not in acute distress.    Appearance: He is well-developed.  Cardiovascular:     Rate and Rhythm: Normal rate and regular rhythm.  Pulmonary:     Effort: Pulmonary effort is normal.     Breath sounds: Normal breath sounds.  Skin:    General: Skin is warm and dry.  Neurological:     Mental Status: He is alert and oriented to person, place, and time.       Assessment & Plan:   Anemia, unspecified type -     CBC -     Comprehensive metabolic panel  Encounter for screening involving social determinants of health (SDoH) -     AMB Referral [facility]BCI Care Management  Other orders -     Ondansetron; Dissolve 1 tablet (4 mg total) in mouth every 8 (eight) hours as needed.  Dispense: 60 tablet; Refill: 0     Return in about 2 weeks (around 06/26/2023).   Ivonne Andrew, NP 06/13/2023

## 2023-06-13 ENCOUNTER — Telehealth: Payer: Self-pay | Admitting: *Deleted

## 2023-06-13 ENCOUNTER — Ambulatory Visit (HOSPITAL_COMMUNITY)
Admission: RE | Admit: 2023-06-13 | Discharge: 2023-06-13 | Disposition: A | Discharge status: Home / Self Care | Payer: Self-pay | Source: Ambulatory Visit | Attending: Oncology | Admitting: Oncology

## 2023-06-13 ENCOUNTER — Other Ambulatory Visit (HOSPITAL_COMMUNITY): Payer: Self-pay

## 2023-06-13 ENCOUNTER — Other Ambulatory Visit: Payer: Self-pay

## 2023-06-13 ENCOUNTER — Other Ambulatory Visit: Payer: Self-pay | Admitting: Nurse Practitioner

## 2023-06-13 ENCOUNTER — Encounter: Payer: Self-pay | Admitting: Nurse Practitioner

## 2023-06-13 ENCOUNTER — Encounter (HOSPITAL_COMMUNITY): Payer: Self-pay

## 2023-06-13 DIAGNOSIS — Z87891 Personal history of nicotine dependence: Secondary | ICD-10-CM | POA: Insufficient documentation

## 2023-06-13 DIAGNOSIS — C160 Malignant neoplasm of cardia: Secondary | ICD-10-CM | POA: Insufficient documentation

## 2023-06-13 DIAGNOSIS — J45909 Unspecified asthma, uncomplicated: Secondary | ICD-10-CM | POA: Diagnosis not present

## 2023-06-13 HISTORY — PX: IR IMAGING GUIDED PORT INSERTION: IMG5740

## 2023-06-13 LAB — CBC
Hematocrit: 34.2 % — ABNORMAL LOW (ref 37.5–51.0)
Hemoglobin: 10.5 g/dL — ABNORMAL LOW (ref 13.0–17.7)
MCH: 25.1 pg — ABNORMAL LOW (ref 26.6–33.0)
MCHC: 30.7 g/dL — ABNORMAL LOW (ref 31.5–35.7)
MCV: 82 fL (ref 79–97)
Platelets: 543 10*3/uL — ABNORMAL HIGH (ref 150–450)
RBC: 4.18 x10E6/uL (ref 4.14–5.80)
RDW: 13.6 % (ref 11.6–15.4)
WBC: 9.6 10*3/uL (ref 3.4–10.8)

## 2023-06-13 LAB — COMPREHENSIVE METABOLIC PANEL
ALT: 19 [IU]/L (ref 0–44)
AST: 18 [IU]/L (ref 0–40)
Albumin: 3.9 g/dL — ABNORMAL LOW (ref 4.1–5.1)
Alkaline Phosphatase: 69 [IU]/L (ref 44–121)
BUN/Creatinine Ratio: 18 (ref 9–20)
BUN: 14 mg/dL (ref 6–24)
Bilirubin Total: 0.4 mg/dL (ref 0.0–1.2)
CO2: 16 mmol/L — ABNORMAL LOW (ref 20–29)
Calcium: 9.6 mg/dL (ref 8.7–10.2)
Chloride: 101 mmol/L (ref 96–106)
Creatinine, Ser: 0.8 mg/dL (ref 0.76–1.27)
Globulin, Total: 3.1 g/dL (ref 1.5–4.5)
Glucose: 109 mg/dL — ABNORMAL HIGH (ref 70–99)
Potassium: 5 mmol/L (ref 3.5–5.2)
Sodium: 139 mmol/L (ref 134–144)
Total Protein: 7 g/dL (ref 6.0–8.5)
eGFR: 110 mL/min/{1.73_m2} (ref >59)

## 2023-06-13 MED ORDER — HEPARIN SOD (PORK) LOCK FLUSH 100 UNIT/ML IV SOLN
INTRAVENOUS | Status: AC
  Filled 2023-06-13: qty 5

## 2023-06-13 MED ORDER — MIDAZOLAM HCL 2 MG/2ML IJ SOLN
INTRAMUSCULAR | Status: AC | PRN
Start: 2023-06-13 — End: 2023-06-13
  Administered 2023-06-13: 1.5 mg via INTRAVENOUS
  Administered 2023-06-13: 1 mg via INTRAVENOUS
  Administered 2023-06-13: .5 mg via INTRAVENOUS

## 2023-06-13 MED ORDER — FENTANYL CITRATE (PF) 100 MCG/2ML IJ SOLN
INTRAMUSCULAR | Status: AC
  Filled 2023-06-13: qty 2

## 2023-06-13 MED ORDER — LIDOCAINE HCL 1 % IJ SOLN
10.0000 mL | Freq: Once | INTRAMUSCULAR | Status: AC
  Administered 2023-06-13: 20 mL via INTRADERMAL

## 2023-06-13 MED ORDER — LIDOCAINE-EPINEPHRINE 1 %-1:100000 IJ SOLN
INTRAMUSCULAR | Status: AC
  Filled 2023-06-13: qty 1

## 2023-06-13 MED ORDER — LIDOCAINE HCL 1 % IJ SOLN
INTRAMUSCULAR | Status: AC
  Filled 2023-06-13: qty 20

## 2023-06-13 MED ORDER — HEPARIN SOD (PORK) LOCK FLUSH 100 UNIT/ML IV SOLN
500.0000 [IU] | Freq: Once | INTRAVENOUS | Status: AC
  Administered 2023-06-13: 500 [IU] via INTRAVENOUS

## 2023-06-13 MED ORDER — LIDOCAINE-EPINEPHRINE 1 %-1:100000 IJ SOLN
20.0000 mL | Freq: Once | INTRAMUSCULAR | Status: AC
  Administered 2023-06-13: 10 mL via INTRADERMAL

## 2023-06-13 MED ORDER — SODIUM CHLORIDE 0.9 % IV SOLN: INTRAVENOUS | Status: DC

## 2023-06-13 MED ORDER — FENTANYL CITRATE (PF) 100 MCG/2ML IJ SOLN
INTRAMUSCULAR | Status: AC | PRN
  Administered 2023-06-13: 25 ug via INTRAVENOUS
  Administered 2023-06-13 (×2): 50 ug via INTRAVENOUS
  Administered 2023-06-13: 25 ug via INTRAVENOUS

## 2023-06-13 MED ORDER — MIDAZOLAM HCL 2 MG/2ML IJ SOLN
INTRAMUSCULAR | Status: AC
  Filled 2023-06-13: qty 2

## 2023-06-13 NOTE — Progress Notes (Signed)
  Care Coordination   Note   06/13/2023 Name: Jesus Haynes MRN: [id number] DOB: October 06, 1975  Jesus Haynes is a 47 y.o. year old male who sees Pcp, No for primary care. I reached out to [name] by phone today to offer care coordination services.  [name] was given information about Care Coordination services today including:   The Care Coordination services include support from the care team which includes your Nurse Coordinator, Clinical Social Worker, or Pharmacist.  The Care Coordination team is here to help remove barriers to the health concerns and goals most important to you. Care Coordination services are voluntary, and the patient may decline or stop services at any time by request to their care team member.   Care Coordination Consent Status: Patient agreed to services and verbal consent obtained.   Follow up plan:  Telephone appointment with care coordination team member scheduled for:  06/14/2023  Encounter Outcome:  Patient Scheduled  Burman Nieves, [facility] Care Coordination Care Guide Direct Dial: [phone]

## 2023-06-13 NOTE — Procedures (Signed)
Interventional Radiology Procedure:   Indications: Adenocarcinoma of gastroesophageal junction (HCC)   Procedure: Port placement  Findings: Right jugular port, tip at SVC/RA junction.    Complications: None     EBL: Minimal, less than 10 ml  Plan: Discharge in one hour.  Keep port site and incisions dry for at least 24 hours.     Iyauna Sing R. Lowella Dandy, MD  Pager: [phone]

## 2023-06-13 NOTE — Patient Instructions (Signed)
Visit Information  Thank you for taking time to visit with me today. Please don't hesitate to contact me if I can be of assistance to you.   Following are the goals we discussed today:  Application for Medicaid, Foodstamps and Disability will be submitted. Mortgage company will be contacted for assistance.   Our next appointment is by telephone on 06/27/23 at 11am  Please call the care guide team at [phone] if you need to cancel or reschedule your appointment.   If you are experiencing a Mental Health or Behavioral Health Crisis or need someone to talk to, please call 911  Patient verbalizes understanding of instructions and care plan provided today and agrees to view in MyChart. Active MyChart status and patient understanding of how to access instructions and care plan via MyChart confirmed with patient.     Telephone follow up appointment with care management team member scheduled for:06/27/23 at 11am.  Lysle Morales, BSW Social Worker [phone]

## 2023-06-13 NOTE — Patient Outreach (Signed)
  Care Coordination   Initial Visit Note   06/13/2023 Name: Jesus Haynes MRN: [id number] DOB: 06-09-1976  Jesus Haynes is a 47 y.o. year old male who sees Pcp, No for primary care. I spoke with  [name] by phone today.  What matters to the patients health and wellness today?  Patient is out of work and needs resources. Patient provides verbal consent to include Sharyl Nimrod and Tresa Endo in the conversation.  Patient is not feeling well today and assessment will be completed another day.    Goals Addressed             This Visit's Progress    Care Coordination Activities       Interventions Today    Flowsheet Row Most Recent Value  Chronic Disease   Chronic disease during today's visit Other  [Cancer]  General Interventions   General Interventions Discussed/Reviewed General Interventions Discussed, General Interventions Reviewed  [Pt is out of work due to cancer. Sharyl Nimrod has the paperwork for Medicaid, Foodstamps and Disability. [place] willing to provide housing if needed.SW suggest speaking to mortgage company to request extention.DPR will be updated in office.]              SDOH assessments and interventions completed:  No     Care Coordination Interventions:  Yes, provided   Follow up plan: Follow up call scheduled for 06/27/23 at 11am.    Encounter Outcome:  Patient Visit Completed

## 2023-06-13 NOTE — Discharge Instructions (Signed)

## 2023-06-14 ENCOUNTER — Encounter: Payer: Self-pay | Admitting: Oncology

## 2023-06-14 ENCOUNTER — Other Ambulatory Visit: Payer: Self-pay

## 2023-06-14 ENCOUNTER — Other Ambulatory Visit (HOSPITAL_COMMUNITY): Payer: Self-pay

## 2023-06-14 ENCOUNTER — Inpatient Hospital Stay: Payer: Medicaid Other

## 2023-06-14 ENCOUNTER — Inpatient Hospital Stay (HOSPITAL_BASED_OUTPATIENT_CLINIC_OR_DEPARTMENT_OTHER): Payer: Medicaid Other | Admitting: Oncology

## 2023-06-14 ENCOUNTER — Encounter (HOSPITAL_COMMUNITY): Payer: Self-pay

## 2023-06-14 ENCOUNTER — Inpatient Hospital Stay: Payer: Medicaid Other | Admitting: Licensed Clinical Social Worker

## 2023-06-14 ENCOUNTER — Inpatient Hospital Stay (HOSPITAL_COMMUNITY)
Admission: AD | Admit: 2023-06-14 | Discharge: 2023-07-11 | DRG: 375 | Disposition: A | Discharge status: Home / Self Care | Payer: Medicaid Other | Source: Ambulatory Visit | Attending: Internal Medicine | Admitting: Internal Medicine

## 2023-06-14 ENCOUNTER — Ambulatory Visit: Payer: Self-pay | Admitting: Dietician

## 2023-06-14 ENCOUNTER — Ambulatory Visit: Payer: Self-pay

## 2023-06-14 ENCOUNTER — Encounter (HOSPITAL_COMMUNITY): Payer: Self-pay | Admitting: Internal Medicine

## 2023-06-14 VITALS — BP 132/76 | HR 80 | Temp 97.7°F | Resp 17

## 2023-06-14 DIAGNOSIS — R791 Abnormal coagulation profile: Secondary | ICD-10-CM | POA: Diagnosis present

## 2023-06-14 DIAGNOSIS — K922 Gastrointestinal hemorrhage, unspecified: Secondary | ICD-10-CM | POA: Diagnosis present

## 2023-06-14 DIAGNOSIS — G893 Neoplasm related pain (acute) (chronic): Secondary | ICD-10-CM | POA: Insufficient documentation

## 2023-06-14 DIAGNOSIS — Z95828 Presence of other vascular implants and grafts: Secondary | ICD-10-CM

## 2023-06-14 DIAGNOSIS — R627 Adult failure to thrive: Principal | ICD-10-CM | POA: Diagnosis present

## 2023-06-14 DIAGNOSIS — Z6828 Body mass index (BMI) 28.0-28.9, adult: Secondary | ICD-10-CM | POA: Diagnosis not present

## 2023-06-14 DIAGNOSIS — D500 Iron deficiency anemia secondary to blood loss (chronic): Secondary | ICD-10-CM | POA: Diagnosis present

## 2023-06-14 DIAGNOSIS — C160 Malignant neoplasm of cardia: Principal | ICD-10-CM | POA: Diagnosis present

## 2023-06-14 DIAGNOSIS — J45909 Unspecified asthma, uncomplicated: Secondary | ICD-10-CM | POA: Diagnosis present

## 2023-06-14 DIAGNOSIS — K769 Liver disease, unspecified: Secondary | ICD-10-CM | POA: Diagnosis present

## 2023-06-14 DIAGNOSIS — Z5971 Insufficient health insurance coverage: Secondary | ICD-10-CM

## 2023-06-14 DIAGNOSIS — Z79899 Other long term (current) drug therapy: Secondary | ICD-10-CM

## 2023-06-14 DIAGNOSIS — R55 Syncope and collapse: Secondary | ICD-10-CM

## 2023-06-14 DIAGNOSIS — Z515 Encounter for palliative care: Secondary | ICD-10-CM

## 2023-06-14 DIAGNOSIS — Z7189 Other specified counseling: Secondary | ICD-10-CM | POA: Diagnosis not present

## 2023-06-14 DIAGNOSIS — D539 Nutritional anemia, unspecified: Secondary | ICD-10-CM | POA: Diagnosis present

## 2023-06-14 DIAGNOSIS — K5900 Constipation, unspecified: Secondary | ICD-10-CM | POA: Diagnosis not present

## 2023-06-14 DIAGNOSIS — K449 Diaphragmatic hernia without obstruction or gangrene: Secondary | ICD-10-CM | POA: Diagnosis present

## 2023-06-14 DIAGNOSIS — E876 Hypokalemia: Secondary | ICD-10-CM | POA: Diagnosis not present

## 2023-06-14 DIAGNOSIS — Z87891 Personal history of nicotine dependence: Secondary | ICD-10-CM

## 2023-06-14 DIAGNOSIS — R1084 Generalized abdominal pain: Secondary | ICD-10-CM

## 2023-06-14 DIAGNOSIS — R1310 Dysphagia, unspecified: Secondary | ICD-10-CM | POA: Diagnosis present

## 2023-06-14 DIAGNOSIS — K56609 Unspecified intestinal obstruction, unspecified as to partial versus complete obstruction: Secondary | ICD-10-CM

## 2023-06-14 DIAGNOSIS — K567 Ileus, unspecified: Secondary | ICD-10-CM | POA: Diagnosis not present

## 2023-06-14 DIAGNOSIS — E440 Moderate protein-calorie malnutrition: Secondary | ICD-10-CM | POA: Diagnosis present

## 2023-06-14 DIAGNOSIS — Z8249 Family history of ischemic heart disease and other diseases of the circulatory system: Secondary | ICD-10-CM

## 2023-06-14 DIAGNOSIS — Z934 Other artificial openings of gastrointestinal tract status: Secondary | ICD-10-CM

## 2023-06-14 LAB — COMPREHENSIVE METABOLIC PANEL
ALT: 21 U/L (ref 0–44)
AST: 17 U/L (ref 15–41)
Albumin: 3.5 g/dL (ref 3.5–5.0)
Alkaline Phosphatase: 49 U/L (ref 38–126)
Anion gap: 9 (ref 5–15)
BUN: 15 mg/dL (ref 6–20)
CO2: 21 mmol/L — ABNORMAL LOW (ref 22–32)
Calcium: 8.7 mg/dL — ABNORMAL LOW (ref 8.9–10.3)
Chloride: 104 mmol/L (ref 98–111)
Creatinine, Ser: 0.85 mg/dL (ref 0.61–1.24)
GFR, Estimated: >60 mL/min (ref >60)
Glucose, Bld: 100 mg/dL — ABNORMAL HIGH (ref 70–99)
Potassium: 3.9 mmol/L (ref 3.5–5.1)
Sodium: 134 mmol/L — ABNORMAL LOW (ref 135–145)
Total Bilirubin: 0.7 mg/dL (ref <1.2)
Total Protein: 6.7 g/dL (ref 6.5–8.1)

## 2023-06-14 LAB — CBC
HCT: 30.4 % — ABNORMAL LOW (ref 39.0–52.0)
Hemoglobin: 9.3 g/dL — ABNORMAL LOW (ref 13.0–17.0)
MCH: 25 pg — ABNORMAL LOW (ref 26.0–34.0)
MCHC: 30.6 g/dL (ref 30.0–36.0)
MCV: 81.7 fL (ref 80.0–100.0)
Platelets: 430 10*3/uL — ABNORMAL HIGH (ref 150–400)
RBC: 3.72 MIL/uL — ABNORMAL LOW (ref 4.22–5.81)
RDW: 13.8 % (ref 11.5–15.5)
WBC: 9 10*3/uL (ref 4.0–10.5)
nRBC: 0 % (ref 0.0–0.2)

## 2023-06-14 LAB — IRON AND IRON BINDING CAPACITY (CC-WL,HP ONLY)
Iron: 20 ug/dL — ABNORMAL LOW (ref 45–182)
Saturation Ratios: 5 % — ABNORMAL LOW (ref 17.9–39.5)
TIBC: 413 ug/dL (ref 250–450)
UIBC: 393 ug/dL — ABNORMAL HIGH (ref 117–376)

## 2023-06-14 LAB — CEA (ACCESS): CEA (CHCC): 47.4 ng/mL — ABNORMAL HIGH (ref 0.00–5.00)

## 2023-06-14 LAB — LACTATE DEHYDROGENASE: LDH: 134 U/L (ref 98–192)

## 2023-06-14 LAB — FERRITIN: Ferritin: 4 ng/mL — ABNORMAL LOW (ref 24–336)

## 2023-06-14 MED ORDER — PANTOPRAZOLE SODIUM 40 MG IV SOLR
40.0000 mg | Freq: Once | INTRAVENOUS | Status: AC
  Administered 2023-06-14: 40 mg via INTRAVENOUS
  Filled 2023-06-14: qty 10

## 2023-06-14 MED ORDER — ENSURE ENLIVE PO LIQD
237.0000 mL | Freq: Two times a day (BID) | ORAL | Status: DC
Start: 2023-06-15 — End: 2023-06-18
  Administered 2023-06-15 – 2023-06-17 (×4): 237 mL via ORAL

## 2023-06-14 MED ORDER — ALBUTEROL SULFATE (5 MG/ML) 0.5% IN NEBU: 2.5000 mg | INHALATION_SOLUTION | Freq: Four times a day (QID) | RESPIRATORY_TRACT | Status: DC | PRN

## 2023-06-14 MED ORDER — OXYCODONE HCL 5 MG/5ML PO SOLN
5.0000 mg | ORAL | 0 refills | Status: DC | PRN
  Filled 2023-06-14: qty 1000, 34d supply, fill #0

## 2023-06-14 MED ORDER — HYDROMORPHONE HCL 1 MG/ML IJ SOLN
1.0000 mg | INTRAMUSCULAR | Status: DC | PRN
  Administered 2023-06-14: 1 mg via INTRAVENOUS
  Filled 2023-06-14: qty 1

## 2023-06-14 MED ORDER — ACETAMINOPHEN 325 MG PO TABS: 650.0000 mg | ORAL_TABLET | Freq: Four times a day (QID) | ORAL | Status: DC | PRN

## 2023-06-14 MED ORDER — ACETAMINOPHEN 650 MG RE SUPP: 650.0000 mg | Freq: Four times a day (QID) | RECTAL | Status: DC | PRN

## 2023-06-14 MED ORDER — OXYCODONE HCL 5 MG/5ML PO SOLN: 5.0000 mg | ORAL | Status: DC | PRN

## 2023-06-14 MED ORDER — SODIUM CHLORIDE 0.9 % IV SOLN: INTRAVENOUS | Status: AC

## 2023-06-14 MED ORDER — HYDROMORPHONE HCL 1 MG/ML IJ SOLN
1.0000 mg | INTRAMUSCULAR | Status: DC | PRN
  Administered 2023-06-14 – 2023-06-15 (×4): 1 mg via INTRAVENOUS
  Filled 2023-06-14 (×4): qty 1

## 2023-06-14 MED ORDER — SODIUM CHLORIDE 0.9 % IV SOLN: Freq: Once | INTRAVENOUS | Status: AC

## 2023-06-14 MED ORDER — ONDANSETRON HCL 4 MG PO TABS: 4.0000 mg | ORAL_TABLET | Freq: Four times a day (QID) | ORAL | Status: DC | PRN

## 2023-06-14 MED ORDER — ONDANSETRON HCL 4 MG/2ML IJ SOLN
4.0000 mg | Freq: Four times a day (QID) | INTRAMUSCULAR | Status: DC | PRN
  Administered 2023-06-15: 4 mg via INTRAVENOUS
  Filled 2023-06-14: qty 2

## 2023-06-14 MED ORDER — ALBUTEROL SULFATE (2.5 MG/3ML) 0.083% IN NEBU
2.5000 mg | INHALATION_SOLUTION | RESPIRATORY_TRACT | Status: DC | PRN
  Administered 2023-06-15 – 2023-07-11 (×7): 2.5 mg via RESPIRATORY_TRACT
  Filled 2023-06-14 (×7): qty 3

## 2023-06-14 MED ORDER — HYDROMORPHONE HCL 1 MG/ML PO LIQD
1.0000 mg | ORAL | Status: DC | PRN
  Administered 2023-06-14 – 2023-06-15 (×2): 1 mg via ORAL
  Filled 2023-06-14 (×2): qty 1

## 2023-06-14 MED ORDER — ALBUTEROL SULFATE (2.5 MG/3ML) 0.083% IN NEBU: 2.5000 mg | INHALATION_SOLUTION | Freq: Four times a day (QID) | RESPIRATORY_TRACT | Status: DC | PRN

## 2023-06-14 NOTE — Assessment & Plan Note (Signed)
-   Continue aggressive pain management.  He would benefit from IV pain medications for now and liquid pain medications at the time of discharge.

## 2023-06-14 NOTE — Progress Notes (Signed)
[facility]  ONCOLOGY CLINIC PROGRESS NOTE   Patient Care Team: Ivonne Andrew, NP as PCP - General (Pulmonary Disease) [facility] Care Management  PATIENT NAME: Jesus Haynes   MR#: [id number] DOB: July 09, 1975  Date of visit: 06/14/2023  ASSESSMENT & PLAN:   Jesus Haynes is a pleasant 47 y.o. gentleman with history of asthma, presented to the ED on 05/27/2023 with complaints of epigastric burning abdominal pain, nausea, retching.  Workup during that hospitalization showed evidence of GE junction adenocarcinoma, very locally advanced disease.  Today he presented to clinic to establish care.  He was dizzy, lightheaded and had a presyncopal episode at the time of lab draw.  Getting admitted to the hospital for further evaluation and management.  Adenocarcinoma of gastroesophageal junction (HCC) -Please review HPI/oncology history for additional details and timeline of events.  -Patient was seen by me during recent hospitalization.  Today he presented to clinic to establish care with [place].  -He has not been able to eat for the last several days and has been feeling dizzy, lightheaded.  Has significant weight loss.  Despite IV hydration in clinic, he continued to remain dizzy and hence we are admitting him to the hospital for further evaluation and management.  -Reviewed staging PET/CT findings.  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  Discussed treatment options.  Plan is to proceed with palliative systemic treatment using FOLFOX plus nivolumab, pending resolution of acute issues.  -I will also discuss with our radiation oncology colleagues to see if he would benefit from some palliative radiation to improve his swallowing.  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  -Please consider surgery consult for feeding  tube/G-tube placement to assist with his nutrition.  Anemia due to chronic blood loss -Continue to monitor CBCD daily and transfuse as needed to maintain hemoglobin closer to 8 because of his symptomatology.  Cancer associated pain - Continue aggressive pain management.  He would benefit from IV pain medications for now and liquid pain medications at the time of discharge.  Pre-syncope -Had a presyncopal episode at the time of lab draw today, likely contributed by decreased nutrition and hydration.  -Please monitor on telemetry for at least a day or 2 to make sure he does not have any underlying cardiac arrhythmias that could be contributing.    I reviewed lab results and outside records for this visit and discussed relevant results with the patient. Diagnosis, plan of care and treatment options were also discussed in detail with the patient. Opportunity provided to ask questions and answers provided to his apparent satisfaction. Provided instructions to call our clinic with any problems, questions or concerns prior to return visit. I recommended to continue follow-up with PCP and sub-specialists. He verbalized understanding and agreed with the plan.   NCCN guidelines have been consulted in the planning of this patient's care.  I spent a total of 55 minutes during this encounter with the patient including review of chart and various tests results, discussions about plan of care and coordination of care plan.   Meryl Crutch, MD  06/14/2023 4:20 PM  [facility] - A DEPT OF [facility] [address] [place] [address] Dept: [phone] Dept Fax: [phone]    CHIEF COMPLAINT/ REASON FOR VISIT:   Recently diagnosed adenocarcinoma  of GE junction with very locally advanced disease and suspected peritoneal implant.   Current Treatment: Plan for palliative systemic treatments with FOLFOX plus nivolumab.  INTERVAL  HISTORY:    Discussed the use of AI scribe software for clinical note transcription with the patient, who gave verbal consent to proceed.   I have reviewed the past medical history, past surgical history, social history and family history with the patient and they are unchanged from previous note.  HISTORY OF PRESENT ILLNESS:    Jesus Haynes is a pleasant 47 y.o. gentleman with history of asthma, presented to the ED on 05/27/2023 with complaints of epigastric burning abdominal pain, nausea, retching.  He also reported some dark tarry stools for 3 days prior to arrival.  He was previously seen in the ED at [facility] on 05/08/2023 with complaints of chest or right upper quadrant abdominal pain.  Workup was unremarkable at that time and he was sent home with Protonix and Carafate.  Since his symptoms persisted, he presented to the ED again.   In the ED, his hemoglobin was noted to be down to 10, compared to 13 previously.  With concern for upper GI bleed, was admitted for further evaluation and management.   Dr. Ewing Schlein performed upper GI endoscopy on 05/28/2023.  It showed partially obstructing, likely malignant esophageal tumor in the lower third of the esophagus.  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  Normal duodenum.  Pathology from GE junction mass came back positive for invasive adenocarcinoma, moderately to poorly differentiated.  Immunostains pending.   CT chest abdomen pelvis on 05/28/2023 showed abnormal wall thickening at the GE junction, compatible with patient's known carcinoma.  Prominent lymph nodes in the retrocrural region on the right side, adjacent to the hiatal hernia.  Abnormal low-density soft tissue in the gastrohepatic region, abutting the lesser curvature and proximal stomach, worrisome for metastatic disease.  Periportal, periceliac, gastrosplenic and left retroperitoneal lymphadenopathy, worrisome for metastatic involvement.  Hypodensities in the liver were also  noted, worrisome for metastatic disease, largest 1 measuring 13 mm in the inferior right lobe.  Fat stranding surrounding the body and tail of pancreas, suggesting acute pancreatitis.   We were consulted during his hospitalization on 05/29/2023 for additional recommendations given new diagnosis of GE junction adenocarcinoma.  On 06/07/2023, staging PET/CT showed large hypermetabolic mass involving the distal esophagus and proximal stomach consistent with known primary esophageal carcinoma.  Multiple hypermetabolic retroperitoneal, lower mediastinal lymph nodes consistent with locally advanced disease.  Suspected direct tumor extension into the pancreatic body.  Hypermetabolic node or peritoneal implant within the omentum.  No other evidence of metastatic disease.  Plan for palliative systemic treatments with FOLFOX plus nivolumab.  Oncology History  Adenocarcinoma of gastroesophageal junction (HCC)  05/29/2023 Initial Diagnosis   Adenocarcinoma of gastroesophageal junction (HCC)   06/19/2023 -  Chemotherapy   Patient is on Treatment Plan : GASTROESOPHAGEAL FOLFOX + Nivolumab q14d         REVIEW OF SYSTEMS:   Review of Systems - Oncology  All other pertinent systems were reviewed with the patient and are negative.  ALLERGIES: He has no known allergies.  MEDICATIONS:  No current outpatient medications on file.   No current facility-administered medications for this visit.     VITALS:   There were no vitals taken for this visit.  Wt Readings from Last 3 Encounters:  06/13/23 174 lb (78.9 kg)  06/12/23 174 lb 6.4 oz (79.1 kg)  05/27/23 200 lb (90.7 kg)  There is no height or weight on file to calculate BMI.  Performance status (ECOG): 2 - Symptomatic, <50% confined to bed  PHYSICAL EXAM:   Physical Exam Constitutional:      General: He is in acute distress.     Appearance: He is ill-appearing.  HENT:     Head: Normocephalic and atraumatic.  Eyes:     General: No  scleral icterus.    Conjunctiva/sclera: Conjunctivae normal.  Cardiovascular:     Rate and Rhythm: Regular rhythm. Tachycardia present.     Heart sounds: Normal heart sounds.  Pulmonary:     Effort: Pulmonary effort is normal.     Breath sounds: Normal breath sounds.  Chest:     Comments: Right-sided Port-A-Cath in place.  Bandaged. Abdominal:     General: There is no distension.  Musculoskeletal:     Right lower leg: No edema.     Left lower leg: No edema.  Neurological:     Mental Status: He is oriented to person, place, and time.  Psychiatric:        Mood and Affect: Mood normal.        Behavior: Behavior normal.       LABORATORY DATA:   I have reviewed the data as listed.  Results for orders placed or performed in visit on 06/14/23  Lactate dehydrogenase (LDH)  Result Value Ref Range   LDH 134 98 - 192 U/L  Iron and Iron Binding Capacity ([facility] only)  Result Value Ref Range   Iron 20 (L) 45 - 182 ug/dL   TIBC 098 119 - 147 ug/dL   Saturation Ratios 5 (L) 17.9 - 39.5 %   UIBC 393 (H) 117 - 376 ug/dL    Lab Results  Component Value Date   WBC 9.6 06/12/2023   NEUTROABS 5.9 05/08/2023   HGB 10.5 (L) 06/12/2023   HCT 34.2 (L) 06/12/2023   MCV 82 06/12/2023   PLT 543 (H) 06/12/2023       Component Value Date/Time   NA 139 06/12/2023 1413   K 5.0 06/12/2023 1413   CL 101 06/12/2023 1413   CO2 16 (L) 06/12/2023 1413   GLUCOSE 109 (H) 06/12/2023 1413   GLUCOSE 112 (H) 05/28/2023 0441   BUN 14 06/12/2023 1413   CREATININE 0.80 06/12/2023 1413   CALCIUM 9.6 06/12/2023 1413   PROT 7.0 06/12/2023 1413   ALBUMIN 3.9 (L) 06/12/2023 1413   AST 18 06/12/2023 1413   ALT 19 06/12/2023 1413   ALKPHOS 69 06/12/2023 1413   BILITOT 0.4 06/12/2023 1413   GFRNONAA >60 05/28/2023 0441   GFRAA >60 08/24/2018 0750      RADIOGRAPHIC STUDIES:  I have personally reviewed the radiological images as listed and agree with the findings in the report.  IR IMAGING  GUIDED PORT INSERTION Result Date: 06/13/2023 INDICATION: Adenocarcinoma of the gastroesophageal junction. Port-A-Cath needed for treatment. EXAM: FLUOROSCOPIC AND ULTRASOUND GUIDED PLACEMENT OF A SUBCUTANEOUS PORT COMPARISON:  None Available. MEDICATIONS: Moderate sedation ANESTHESIA/SEDATION: Moderate (conscious) sedation was employed during this procedure. A total of Versed 3 mg and fentanyl 150 mcg was administered intravenously at the order of the provider performing the procedure. Total intra-service moderate sedation time: 29 minutes. Patient's level of consciousness and vital signs were monitored continuously by radiology nurse throughout the procedure under the supervision of the provider performing the procedure. FLUOROSCOPY TIME:  Radiation Exposure Index (as provided by the fluoroscopic device): 1 mGy Kerma COMPLICATIONS: None immediate. PROCEDURE: The procedure, risks, benefits,  and alternatives were explained to the patient. Questions regarding the procedure were encouraged and answered. The patient understands and consents to the procedure. Patient was placed supine on the interventional table. Ultrasound confirmed a patent right internal jugular vein. Ultrasound image was saved for documentation. The right chest and neck were cleaned with a skin antiseptic and a sterile drape was placed. Maximal barrier sterile technique was utilized including caps, mask, sterile gowns, sterile gloves, sterile drape, hand hygiene and skin antiseptic. The right neck was anesthetized with 1% lidocaine. Small incision was made in the right neck with a blade. Micropuncture set was placed in the right internal jugular vein with ultrasound guidance. The micropuncture wire was used for measurement purposes. The right chest was anesthetized with 1% lidocaine with epinephrine. #15 blade was used to make an incision and a subcutaneous port pocket was formed. 8 french Power Port was assembled. Subcutaneous tunnel was formed  with a stiff tunneling device. The port catheter was brought through the subcutaneous tunnel. The port was placed in the subcutaneous pocket. The micropuncture set was exchanged for a peel-away sheath. The catheter was placed through the peel-away sheath and the tip was positioned at the superior cavoatrial junction. Catheter placement was confirmed with fluoroscopy. The port was accessed and flushed with heparinized saline. The port pocket was closed using two layers of absorbable sutures and Dermabond. The vein skin site was closed using a single layer of absorbable suture and Dermabond. Sterile dressings were applied. Patient tolerated the procedure well without an immediate complication. Ultrasound and fluoroscopic images were taken and saved for this procedure. IMPRESSION: Placement of a subcutaneous power-injectable port device. Catheter tip at the superior cavoatrial junction. Electronically Signed   By: Richarda Overlie M.D.   On: 06/13/2023 13:39   NM PET Image Initial (PI) Skull Base To Thigh Result Date: 06/07/2023 CLINICAL DATA:  Initial treatment strategy for newly diagnosed adenocarcinoma at the GE junction. Possible hepatic metastatic disease. EXAM: NUCLEAR MEDICINE PET SKULL BASE TO THIGH TECHNIQUE: 9.5 mCi F-18 FDG was injected intravenously. Full-ring PET imaging was performed from the skull base to thigh after the radiotracer. CT data was obtained and used for attenuation correction and anatomic localization. Fasting blood glucose: 111 mg/dl COMPARISON:  CT of the chest, abdomen and pelvis 05/28/2023. FINDINGS: Mediastinal blood pool activity: SUV max 2.8 NECK: No hypermetabolic cervical lymph nodes are identified.Fairly symmetric activity within the lymphoid tissue of Waldeyer's ring is within physiologic limits. No suspicious activity identified within the pharyngeal mucosal space. Incidental CT findings: none CHEST: As seen on recent CT, there is a large mass involving the distal esophagus with  extension into the gastric cardia which is hypermetabolic. There are several hypermetabolic distal paraesophageal and right paratracheal lymph nodes, including a 1.0 cm left paraesophageal node on image 82/4 (SUV max 6.2) and a 1.0 cm right retrocrural node on image 92/4 (SUV max 8.1). No hypermetabolic upper mediastinal, hilar or axillary lymph nodes. Low-level hilar activity is within physiologic limits. No hypermetabolic pulmonary activity or suspicious nodularity. Incidental CT findings: Stable small calcified right upper lobe granuloma. ABDOMEN/PELVIS: As above, large hypermetabolic mass extending from the distal esophagus into the proximal stomach. This has an SUV max of 27.7 and the hypermetabolic activity spans an area measuring up to 8.1 x 8.1 cm transverse. There is direct extension of hypermetabolic activity into the porta hepatis, although no discrete hypermetabolic liver lesions are identified. There are multiple hypermetabolic lymph nodes in the gastrohepatic ligament with inferior extension and probable  direct involvement of the pancreas. There is hypermetabolic activity throughout the pancreas, suspicious for tumor involvement. Additional hypermetabolic abdominal lymph nodes include a 1.9 cm node at the splenic hilum on image 92/4 (SUV max 12.1) and a lymph node or peritoneal implants within the omentum measuring 2.1 x 1.1 cm on image 105/4 (SUV max 15.6). There are hypermetabolic retroperitoneal lymph nodes extending inferior to the renal veins (SUV max 18.5). No hypermetabolic lymph nodes identified in the pelvis. There is no adrenal mass. Incidental CT findings: Mild iliac atherosclerosis.  No ascites. SKELETON: There is no hypermetabolic activity to suggest osseous metastatic disease. Incidental CT findings: none IMPRESSION: 1. Large hypermetabolic mass involving the distal esophagus and proximal stomach consistent with known primary esophageal carcinoma. 2. Multiple hypermetabolic  retroperitoneal and lower mediastinal lymph nodes as described, consistent with locally advanced disease. Suspected direct tumor extension into the pancreatic body. Hypermetabolic node or peritoneal implant within the omentum. 3. No evidence of metastatic disease within the neck, chest or pelvis. No osseous abnormalities. Electronically Signed   By: Carey Bullocks M.D.   On: 06/07/2023 17:48   CT CHEST ABDOMEN PELVIS W CONTRAST Result Date: 05/28/2023 CLINICAL DATA:  Metastatic disease evaluation, gastroesophageal junction cancer. EXAM: CT CHEST, ABDOMEN, AND PELVIS WITH CONTRAST TECHNIQUE: Multidetector CT imaging of the chest, abdomen and pelvis was performed following the standard protocol during bolus administration of intravenous contrast. RADIATION DOSE REDUCTION: This exam was performed according to the departmental dose-optimization program which includes automated exposure control, adjustment of the mA and/or kV according to patient size and/or use of iterative reconstruction technique. CONTRAST:  OMNIPAQUE IOHEXOL 300 MG/ML  SOLN COMPARISON:  CT chest 08/08/2017 FINDINGS: CT CHEST FINDINGS Cardiovascular: No significant vascular findings. Normal heart size. No pericardial effusion. Mediastinum/Nodes: Visualized thyroid gland is within normal limits. A small hiatal hernia is present with thickening at the gastroesophageal junction likely related to patient's known carcinoma. Remaining esophagus is nondilated. There are prominent lymph nodes adjacent to the hiatal hernia in the retrocrural region on the right measuring up to 7 mm. No other enlarged mediastinal, hilar or axillary lymph nodes are seen. Lungs/Pleura: There are atelectatic changes in the lingula and bilateral lower lobes. The lungs are otherwise clear. There is no pleural effusion or pneumothorax. There is a calcified granuloma in the right upper lobe. Musculoskeletal: No chest wall mass or suspicious bone lesions identified. CT  ABDOMEN PELVIS FINDINGS Hepatobiliary: There are 2 hypodensities in the inferior right lobe of the liver measuring up to 13 mm. There is a subcentimeter hypodensity in the left lobe of the liver image 2/54. Liver parenchyma otherwise appears within normal limits. Gallbladder and bile ducts are within normal limits. There is fat stranding surrounding the body and tail of the pancreas. No ductal dilatation or fluid collection. Spleen: Normal in size without focal abnormality. Adrenals/Urinary Tract: Adrenal glands are unremarkable. Kidneys are normal, without renal calculi, focal lesion, or hydronephrosis. Bladder is unremarkable. Stomach/Bowel: There is abnormal wall thickening in the gastroesophageal junction. A small hiatal hernia is present. Findings are likely related to patient's known carcinoma. Distal stomach, small bowel, colon and appendix are otherwise within normal limits. Vascular/Lymphatic: The aorta and IVC are patent. Portal vein and superior mesenteric vein are patent. Splenic vein is grossly patent. Abnormal low-density soft tissues seen in the gastro hepatic region abutting the lesser curvature and proximal stomach. The 2 largest areas measure 2.2 x 5.1 cm image 2/56 and 2.5 x 3.1 cm image 2/55. There is periportal lymphadenopathy  with largest lymph node measuring up to 12 mm short axis image 2/60. There is paraceliac lymphadenopathy abutting the posterior body of the pancreas With largest lymph node measuring 3.7 x 1.7 cm image 2/60. Soft tissue nodular density seen in the gastrosplenic region measuring 2.1 x 1.3 cm image 2/54. There is a mesenteric nodule anterior to the gastric antrum measuring 1.5 x 1.4 cm image 2/61. There are additional numerous scattered nonenlarged peripancreatic and central mesenteric lymph nodes. There is left retroperitoneal lymphadenopathy at the level of the kidneys with largest lymph node measuring 14 mm short axis image 2/71. Reproductive: Prostate is unremarkable.  Other: There is a small fat containing right inguinal and umbilical hernia. There is no ascites. Musculoskeletal: No acute fracture or focal osseous lesion. IMPRESSION: 1. Abnormal wall thickening at the gastroesophageal junction compatible with patient's known carcinoma. 2. Prominent lymph nodes in the retrocrural region on the right adjacent to the hiatal hernia. 3. Abnormal low-density soft tissue in the gastrohepatic region abutting the lesser curvature and proximal stomach worrisome for metastatic disease. 4. Periportal, paraceliac, gastrosplenic and left retroperitoneal lymphadenopathy worrisome for metastatic disease. 5. Hypodensities in the liver worrisome for metastatic disease. 6. Fat stranding surrounding the body and tail of the pancreas worrisome for acute pancreatitis. No fluid collection. Electronically Signed   By: Darliss Cheney M.D.   On: 05/28/2023 23:01    CODE STATUS:  Code Status History     Date Active Date Inactive Code Status Order ID Comments User Context   06/13/2023 1253 06/14/2023 0509 Full Code [id number]  Richarda Overlie, MD [facility]   05/27/2023 1516 05/30/2023 1651 Full Code [id number]  Maryln Gottron, MD ED    Questions for Most Recent Historical Code Status (Order [id number])     Question Answer   By: Consent: discussion documented in EHR               Future Appointments  Date Time Provider Department Center  06/27/2023 11:00 AM Clemons, Angelina Sheriff [facility] None      This document was completed utilizing speech recognition software. Grammatical errors, random word insertions, pronoun errors, and incomplete sentences are an occasional consequence of this system due to software limitations, ambient noise, and hardware issues. Any formal questions or concerns about the content, text or information contained within the body of this dictation should be directly addressed to the provider for clarification.

## 2023-06-14 NOTE — Assessment & Plan Note (Signed)
-  Please review HPI/oncology history for additional details and timeline of events.  -Patient was seen by me during recent hospitalization.  Today he presented to clinic to establish care with [place].  -He has not been able to eat for the last several days and has been feeling dizzy, lightheaded.  Has significant weight loss.  Despite IV hydration in clinic, he continued to remain dizzy and hence we are admitting him to the hospital for further evaluation and management.  -Reviewed staging PET/CT findings.  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  Discussed treatment options.  Plan is to proceed with palliative systemic treatment using FOLFOX plus nivolumab, pending resolution of acute issues.  -I will also discuss with our radiation oncology colleagues to see if he would benefit from some palliative radiation to improve his swallowing.

## 2023-06-14 NOTE — Progress Notes (Signed)
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 5 Therapeutic Status: Distant Metastases (No Additional Staging) Histology: Adenocarcinoma Disease Classification: GE Junction Line of Therapy: First Line HER2 Status: Negative PD-L1 Expression Status: PD-L1 Expression Positive CPS ? 5 Intent of Therapy: Non-Curative / Palliative Intent, Discussed with Patient

## 2023-06-14 NOTE — Assessment & Plan Note (Signed)
-  Had a presyncopal episode at the time of lab draw today, likely contributed by decreased nutrition and hydration.  -Please monitor on telemetry for at least a day or 2 to make sure he does not have any underlying cardiac arrhythmias that could be contributing.

## 2023-06-14 NOTE — Progress Notes (Addendum)
 Plan of Care Note for accepted transfer   Patient: Jesus Haynes MRN: [id number]     Facility requesting transfer: [facility]. Requesting Provider: Meryl Crutch, MD Reason for transfer: Failure to thrive, abdominal pain, Will need GT insertion. Facility course:  47 yo male with a history of GE junction adenocarcinoma who has been having, N/V, dry heaving , abdominal pain.He was light headed this morning with concern for near syncope. He has been tachycardic and very dizzy today.  The dizziness has persisted despite getting IV fluids.  We are admitting him for further IV hydration, near syncope workup and oncology would like general surgery evaluation for gastric tube placement.  He felt sick this morning during blood draw.  We will get labs in the hospital when the patient feels better.   Plan of care: The patient is accepted for admission to Telemetry unit, at [facility].  Author: Bobette Mo, MD 06/14/2023  Check [email or web address] for on-call coverage.  Nursing staff, Please call [facility] Admits & Consults System-Wide number on Amion as soon as patient's arrival, so appropriate admitting provider can evaluate the pt.

## 2023-06-14 NOTE — Assessment & Plan Note (Signed)
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  -Please consider surgery consult for feeding tube/G-tube placement to assist with his nutrition.

## 2023-06-14 NOTE — Progress Notes (Signed)
[facility] Clinical Social Work  Clinical Social Work was referred by  RD S. Ebony Cargo  for assessment of psychosocial needs.  Clinical Social Worker  met with patient and caregiver (mother in law [place])  to offer support and assess for needs.    Patient was minimally awake during conversation but did share that he has 7 kids, ages 11& up. The youngest 2 were living with him prior to diagnosis, but are now staying with his ex because of his health. Mother in law is his biggest support right now with some help from his adult children. 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   Mother in law has been trying to assist with signing up for food stamps and Medicaid. She had questions about advance directives, disability, and counseling for the kids. CSW provided information and recommended completing AD inpatient if possible and starting referral to [facility] if pt is alert enough during admission to do both of those tasks.  Provided contact for [facility] for counseling support for pt's children.  CSW encouraged them to reach out to primary CSW, Scot Dock, with needs and sent message to CSW with update.   Darvell Monteforte E Ashanta Amoroso, LCSW  Clinical Social Worker [facility]

## 2023-06-14 NOTE — H&P (Signed)
History and Physical    Patient: Jesus Haynes:[id number] DOB: [birth year] DOA: 06/14/2023 DOS: the patient was seen and examined on 06/14/2023 PCP: Ivonne Andrew, NP  Patient coming from: Home  Chief Complaint: Near syncope, epigastric pain and odynophagia.  HPI: Jesus Haynes is a 47 y.o. male with medical history significant of asthma, adenocarcinoma gastroesophageal junction, cancer associated pain, history of GI bleed, history of melena, anemia due to chronic blood loss was sent from the cancer center after he had a near syncopal episode while they were drawing multiple vials of blood.  He has some palpitations, became diaphoretic.  No dyspnea or chest pain.  He has been nauseous with dry heaving, but no emesis in the last few days.  He has been constipated for about a week, but has been on a liquid diet all of that time.  His last labs were done 2 days ago.  He has agreed to get the labs done once he gets hydrated as he is afraid he may pass out again.   Review of Systems: As mentioned in the history of present illness. All other systems reviewed and are negative. Past Medical History:  Diagnosis Date   Asthma    Past Surgical History:  Procedure Laterality Date   BIOPSY  05/28/2023   Procedure: BIOPSY;  Surgeon: Vida Rigger, MD;  Location: [facility];  Service: Gastroenterology;;   ESOPHAGOGASTRODUODENOSCOPY (EGD) WITH PROPOFOL N/A 05/28/2023   Procedure: ESOPHAGOGASTRODUODENOSCOPY (EGD) WITH PROPOFOL;  Surgeon: Vida Rigger, MD;  Location: [facility];  Service: Gastroenterology;  Laterality: N/A;   IR IMAGING GUIDED PORT INSERTION  06/13/2023   Social History:  reports that he quit smoking about 10 years ago. His smoking use included cigarettes. He has never used smokeless tobacco. He reports that he does not drink alcohol and does not use drugs.  No Known Allergies  Family History  Problem Relation Age of Onset   Heart failure Father     Prior to Admission  medications   Medication Sig Start Date End Date Taking? Authorizing Provider  albuterol (PROVENTIL) (5 MG/ML) 0.5% nebulizer solution Take 0.5 mLs (2.5 mg total) by nebulization every 6 (six) hours as needed for wheezing or shortness of breath. 04/28/23   Roxy Horseman, PA-C  albuterol (VENTOLIN HFA) 108 (90 Base) MCG/ACT inhaler Inhale 1-2 puffs into the lungs every 4 (four) hours as needed for wheezing or shortness of breath. 06/20/22   Mare Ferrari, PA-C  feeding supplement (ENSURE ENLIVE / ENSURE PLUS) LIQD Take 237 mLs by mouth 2 (two) times daily between meals. 05/30/23   Lanae Boast, MD  ondansetron (ZOFRAN) 4 MG tablet Take 1 tablet (4 mg total) by mouth every 6 (six) hours as needed for up to 30 doses for nausea. 05/30/23   Lanae Boast, MD  ondansetron (ZOFRAN-ODT) 4 MG disintegrating tablet Dissolve 1 tablet (4 mg total) in mouth every 8 (eight) hours as needed. 06/12/23 07/12/23  Ivonne Andrew, NP  oxyCODONE (ROXICODONE) 5 MG/5ML solution Take 5 mLs (5 mg total) by mouth every 4 (four) hours as needed for up to 5 days for severe pain (pain score 7-10). 06/14/23 07/18/23  Pasam, Archie Patten, MD  pantoprazole (PROTONIX) 40 MG tablet Take 1 tablet (40 mg total) by mouth daily. 05/30/23 06/29/23  Lanae Boast, MD  traZODone (DESYREL) 50 MG tablet Take 0.5 tablets (25 mg total) by mouth at bedtime as needed for up to 30 doses for sleep. 05/30/23   Lanae Boast, MD  cetirizine (  ZYRTEC) 10 MG tablet Take 1 tablet (10 mg total) by mouth daily. Patient not taking: Reported on 01/26/2019 02/23/18 07/26/20  Maczis, Elmer Sow, PA-C  fluticasone [facility]) 50 MCG/ACT nasal spray Place 1-2 sprays into both nostrils daily. Patient not taking: Reported on 01/26/2019 02/23/18 07/26/20  Jacinto Halim, PA-C    Physical Exam: Vitals:   06/14/23 1552  BP: (!) 154/102  Pulse: 84  Resp: 18  Temp: 98.2 F (36.8 C)  TempSrc: Oral  SpO2: 97%   Physical Exam Vitals and nursing note reviewed.  Constitutional:       General: He is awake. He is not in acute distress.    Appearance: He is overweight. He is ill-appearing. He is not toxic-appearing.  HENT:     Head: Normocephalic.     Nose: No rhinorrhea.     Mouth/Throat:     Mouth: Mucous membranes are moist.  Eyes:     General: No scleral icterus.    Pupils: Pupils are equal, round, and reactive to light.  Neck:     Vascular: No JVD.  Cardiovascular:     Rate and Rhythm: Normal rate and regular rhythm.     Heart sounds: S1 normal and S2 normal.  Pulmonary:     Effort: Pulmonary effort is normal.     Breath sounds: Normal breath sounds. No wheezing, rhonchi or rales.  Abdominal:     General: Bowel sounds are normal. There is no distension.     Palpations: Abdomen is soft.     Tenderness: There is no abdominal tenderness. There is no right CVA tenderness, left CVA tenderness, guarding or rebound.     Comments: Recently medicated with analgesics.  Musculoskeletal:     Cervical back: Neck supple.     Right lower leg: No edema.     Left lower leg: No edema.  Skin:    General: Skin is warm and dry.  Neurological:     General: No focal deficit present.     Mental Status: He is alert and oriented to person, place, and time.  Psychiatric:        Mood and Affect: Mood normal.        Behavior: Behavior normal. Behavior is cooperative.     Data Reviewed:  Results are pending, will review when available.  EKG: Normal sinus rhythm.  Assessment and Plan: Principal Problem:   Failure to thrive in adult In the setting of poor oral intake secondary to:   Adenocarcinoma of gastroesophageal junction (HCC) Also with:   Cancer associated pain Telemetry/inpatient. Pain management switched to hydromorphone. He has been able to tolerate diet after administration. The patient thinks that he can go home with hydromorphone. -Oxycodone has not been effective. PEG placement discussed with general surgery. -May need laparoscopic or open G or  J-tube.  Active Problems:   Pre-syncope Continue IV fluids. Correct electrolyte abnormality PRN. Check carotid Doppler. Check echocardiogram.    Anemia due to chronic blood loss Monitor hematocrit and hemoglobin. Transfuse as needed.    Asthma Continue albuterol MDI as needed.   Advance Care Planning:   Code Status: Full Code   Consults: [place] surgery.  Family Communication:   Severity of Illness: The appropriate patient status for this patient is INPATIENT. Inpatient status is judged to be reasonable and necessary in order to provide the required intensity of service to ensure the patient's safety. The patient's presenting symptoms, physical exam findings, and initial radiographic and laboratory data in the context of  their chronic comorbidities is felt to place them at high risk for further clinical deterioration. Furthermore, it is not anticipated that the patient will be medically stable for discharge from the hospital within 2 midnights of admission.   * I certify that at the point of admission it is my clinical judgment that the patient will require inpatient hospital care spanning beyond 2 midnights from the point of admission due to high intensity of service, high risk for further deterioration and high frequency of surveillance required.*  Author: Bobette Mo, MD 06/14/2023 4:22 PM  For on call review [email or web address].   This document was prepared using Dragon voice recognition software and may contain some unintended transcription errors.

## 2023-06-14 NOTE — Progress Notes (Signed)
Patient direct admitted to 6E Room 1620 via wheelchair with family member present. 22 G PIV to RAC left patent for hospital admission.  Bedside report given to [facility], RN. Patient in stable condition at transfer.

## 2023-06-14 NOTE — Assessment & Plan Note (Signed)
-  Continue to monitor CBCD daily and transfuse as needed to maintain hemoglobin closer to 8 because of his symptomatology.

## 2023-06-15 ENCOUNTER — Inpatient Hospital Stay (HOSPITAL_COMMUNITY): Payer: Medicaid Other

## 2023-06-15 ENCOUNTER — Other Ambulatory Visit: Payer: Self-pay

## 2023-06-15 DIAGNOSIS — G893 Neoplasm related pain (acute) (chronic): Secondary | ICD-10-CM | POA: Diagnosis not present

## 2023-06-15 DIAGNOSIS — R55 Syncope and collapse: Secondary | ICD-10-CM

## 2023-06-15 DIAGNOSIS — C160 Malignant neoplasm of cardia: Secondary | ICD-10-CM

## 2023-06-15 DIAGNOSIS — R627 Adult failure to thrive: Secondary | ICD-10-CM | POA: Diagnosis not present

## 2023-06-15 DIAGNOSIS — D500 Iron deficiency anemia secondary to blood loss (chronic): Secondary | ICD-10-CM

## 2023-06-15 LAB — COMPREHENSIVE METABOLIC PANEL
ALT: 20 U/L (ref 0–44)
AST: 16 U/L (ref 15–41)
Albumin: 3.1 g/dL — ABNORMAL LOW (ref 3.5–5.0)
Alkaline Phosphatase: 47 U/L (ref 38–126)
Anion gap: 5 (ref 5–15)
BUN: 14 mg/dL (ref 6–20)
CO2: 24 mmol/L (ref 22–32)
Calcium: 8.5 mg/dL — ABNORMAL LOW (ref 8.9–10.3)
Chloride: 107 mmol/L (ref 98–111)
Creatinine, Ser: 0.72 mg/dL (ref 0.61–1.24)
GFR, Estimated: >60 mL/min (ref >60)
Glucose, Bld: 106 mg/dL — ABNORMAL HIGH (ref 70–99)
Potassium: 3.8 mmol/L (ref 3.5–5.1)
Sodium: 136 mmol/L (ref 135–145)
Total Bilirubin: 0.6 mg/dL (ref <1.2)
Total Protein: 6 g/dL — ABNORMAL LOW (ref 6.5–8.1)

## 2023-06-15 LAB — ECHOCARDIOGRAM COMPLETE
Area-P 1/2: 4.39 cm2
S' Lateral: 2.6 cm

## 2023-06-15 LAB — CBC
HCT: 27.8 % — ABNORMAL LOW (ref 39.0–52.0)
Hemoglobin: 8.4 g/dL — ABNORMAL LOW (ref 13.0–17.0)
MCH: 25 pg — ABNORMAL LOW (ref 26.0–34.0)
MCHC: 30.2 g/dL (ref 30.0–36.0)
MCV: 82.7 fL (ref 80.0–100.0)
Platelets: 345 10*3/uL (ref 150–400)
RBC: 3.36 MIL/uL — ABNORMAL LOW (ref 4.22–5.81)
RDW: 13.8 % (ref 11.5–15.5)
WBC: 6.7 10*3/uL (ref 4.0–10.5)
nRBC: 0 % (ref 0.0–0.2)

## 2023-06-15 MED ORDER — HYDROMORPHONE HCL 1 MG/ML IJ SOLN
1.0000 mg | INTRAMUSCULAR | Status: DC | PRN
  Administered 2023-06-15 – 2023-06-17 (×17): 1 mg via INTRAVENOUS
  Filled 2023-06-15 (×19): qty 1

## 2023-06-15 MED ORDER — SODIUM CHLORIDE 0.9 % IV SOLN: INTRAVENOUS | Status: AC

## 2023-06-15 NOTE — Consult Note (Signed)
Reason for Consult:feeding access Referring Physician: Dr Herschel Senegal is an 47 y.o. male.  HPI: Jesus Haynes recently found to have large fungating mass in lower third of esophagus as well as in gastric cardia.  Biopsies with adenoca.  He has petct that shows likely stage IV disease and is being treated as such by oncology has a port in place. Plan for chemotherapy and is due to see rad onc also.  He was seen in oncology office and was sent for admission for ftt.  Patient states when he was in hospital with pain controlled he was able to swallow fine without issues. He thinks related to pain.  Having bowel function.  He is tol po when I see him this morning.  Thinks would be reasonable to see how it goes this weekend with pain controlled before considering feeding access  Past Medical History:  Diagnosis Date   Asthma     Past Surgical History:  Procedure Laterality Date   BIOPSY  05/28/2023   Procedure: BIOPSY;  Surgeon: Vida Rigger, MD;  Location: [facility];  Service: Gastroenterology;;   ESOPHAGOGASTRODUODENOSCOPY (EGD) WITH PROPOFOL N/A 05/28/2023   Procedure: ESOPHAGOGASTRODUODENOSCOPY (EGD) WITH PROPOFOL;  Surgeon: Vida Rigger, MD;  Location: [facility];  Service: Gastroenterology;  Laterality: N/A;   IR IMAGING GUIDED PORT INSERTION  06/13/2023    Family History  Problem Relation Age of Onset   Heart failure Father     Social History:  reports that he quit smoking about 10 years ago. His smoking use included cigarettes. He has never used smokeless tobacco. He reports that he does not drink alcohol and does not use drugs.  Allergies: No Known Allergies  Medications: I have reviewed the patient's current medications.  Results for orders placed or performed during the hospital encounter of 06/14/23 (from the past 48 hours)  CBC     Status: Abnormal   Collection Time: 06/14/23  8:05 PM  Result Value Ref Range   WBC 9.0 4.0 - 10.5 K/uL   RBC 3.72 (L) 4.22 - 5.81 MIL/uL    Hemoglobin 9.3 (L) 13.0 - 17.0 g/dL   HCT 16.1 (L) 09.6 - 04.5 %   MCV 81.7 80.0 - 100.0 fL   MCH 25.0 (L) 26.0 - 34.0 pg   MCHC 30.6 30.0 - 36.0 g/dL   RDW 40.9 81.1 - 91.4 %   Platelets 430 (H) 150 - 400 K/uL   nRBC 0.0 0.0 - 0.2 %    Comment: Performed at [facility], [address] [place], [place] [address]  Comprehensive metabolic panel     Status: Abnormal   Collection Time: 06/14/23  8:05 PM  Result Value Ref Range   Sodium 134 (L) 135 - 145 mmol/L   Potassium 3.9 3.5 - 5.1 mmol/L   Chloride 104 98 - 111 mmol/L   CO2 21 (L) 22 - 32 mmol/L   Glucose, Bld 100 (H) 70 - 99 mg/dL    Comment: Glucose reference range applies only to samples taken after fasting for at least 8 hours.   BUN 15 6 - 20 mg/dL   Creatinine, Ser 6.21 0.61 - 1.24 mg/dL   Calcium 8.7 (L) 8.9 - 10.3 mg/dL   Total Protein 6.7 6.5 - 8.1 g/dL   Albumin 3.5 3.5 - 5.0 g/dL   AST 17 15 - 41 U/L   ALT 21 0 - 44 U/L   Alkaline Phosphatase 49 38 - 126 U/L   Total Bilirubin  0.7 <1.2 mg/dL   GFR, Estimated >16 >10 mL/min    Comment: (NOTE) Calculated using the CKD-EPI Creatinine Equation (2021)    Anion gap 9 5 - 15    Comment: Performed at [facility], [address] [place], [place] [address]    IR IMAGING GUIDED PORT INSERTION Result Date: 06/13/2023 INDICATION: Adenocarcinoma of the gastroesophageal junction. Port-A-Cath needed for treatment. EXAM: FLUOROSCOPIC AND ULTRASOUND GUIDED PLACEMENT OF A SUBCUTANEOUS PORT COMPARISON:  None Available. MEDICATIONS: Moderate sedation ANESTHESIA/SEDATION: Moderate (conscious) sedation was employed during this procedure. A total of Versed 3 mg and fentanyl 150 mcg was administered intravenously at the order of the provider performing the procedure. Total intra-service moderate sedation time: 29 minutes. Patient's level of consciousness and vital signs were monitored continuously by radiology nurse throughout the procedure under  the supervision of the provider performing the procedure. FLUOROSCOPY TIME:  Radiation Exposure Index (as provided by the fluoroscopic device): 1 mGy Kerma COMPLICATIONS: None immediate. PROCEDURE: The procedure, risks, benefits, and alternatives were explained to the patient. Questions regarding the procedure were encouraged and answered. The patient understands and consents to the procedure. Patient was placed supine on the interventional table. Ultrasound confirmed a patent right internal jugular vein. Ultrasound image was saved for documentation. The right chest and neck were cleaned with a skin antiseptic and a sterile drape was placed. Maximal barrier sterile technique was utilized including caps, mask, sterile gowns, sterile gloves, sterile drape, hand hygiene and skin antiseptic. The right neck was anesthetized with 1% lidocaine. Small incision was made in the right neck with a blade. Micropuncture set was placed in the right internal jugular vein with ultrasound guidance. The micropuncture wire was used for measurement purposes. The right chest was anesthetized with 1% lidocaine with epinephrine. #15 blade was used to make an incision and a subcutaneous port pocket was formed. 8 french Power Port was assembled. Subcutaneous tunnel was formed with a stiff tunneling device. The port catheter was brought through the subcutaneous tunnel. The port was placed in the subcutaneous pocket. The micropuncture set was exchanged for a peel-away sheath. The catheter was placed through the peel-away sheath and the tip was positioned at the superior cavoatrial junction. Catheter placement was confirmed with fluoroscopy. The port was accessed and flushed with heparinized saline. The port pocket was closed using two layers of absorbable sutures and Dermabond. The vein skin site was closed using a single layer of absorbable suture and Dermabond. Sterile dressings were applied. Patient tolerated the procedure well without an  immediate complication. Ultrasound and fluoroscopic images were taken and saved for this procedure. IMPRESSION: Placement of a subcutaneous power-injectable port device. Catheter tip at the superior cavoatrial junction. Electronically Signed   By: Richarda Overlie M.D.   On: 06/13/2023 13:Jesus    Review of Systems  Gastrointestinal:  Positive for abdominal distention.  All other systems reviewed and are negative.  Blood pressure 115/76, pulse 74, temperature 98 F (36.7 C), temperature source Oral, resp. rate 18, SpO2 97%. Physical Exam Vitals reviewed.  Constitutional:      Appearance: Normal appearance.  Eyes:     General: No scleral icterus. Cardiovascular:     Rate and Rhythm: Normal rate and regular rhythm.  Pulmonary:     Effort: Pulmonary effort is normal.     Breath sounds: No wheezing.  Abdominal:     General: There is no distension.     Palpations: Abdomen is soft.     Tenderness: There is no abdominal  tenderness.  Musculoskeletal:        General: Normal range of motion.  Skin:    General: Skin is warm and dry.     Capillary Refill: Capillary refill takes less than 2 seconds.  Neurological:     General: No focal deficit present.     Mental Status: He is alert.  Psychiatric:        Mood and Affect: Mood normal.        Behavior: Behavior normal.     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

## 2023-06-15 NOTE — Hospital Course (Addendum)
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 11/25 - 11/28 presents with dysphagia and abdominal pain was found to have adenocarcinoma of the GE junction.  Set up with hematology outpatient. 12/12 underwent Port-A-Cath placement. 12/13 due to dizziness while in the clinic was referred for direct admission.  Being treated for failure to thrive. 12/17 after failing conservative management with ongoing pain and difficulty swallowing underwent open placement of feeding jejunostomy tube with Dr. Sophronia Simas. 12/18 started on tube feeds Osmolite. 12/23 appears to have developed colonic ileus.  SBO protocol ordered.  EKG was consulted. 12/24 Hb dropped to 5.3.  Received 2 PRBC transfusion.  Recheck Hb 8.0 close to baseline. 12/25.  Patient requested to be off of tube feeding and has been refusing since as it was causing excessive fullness. 12/27 surgery signed off. Assessment and Plan: Adult failure to thrive. Moderate protein calorie malnutrition  Body mass index is 28.25 kg/m.  Presents with dizziness and lightheadedness.  In the setting of poor p.o. intake from failure to thrive. Dietitian has been following. Due to poor p.o. intake general surgery was consulted for consideration of feeding jejunostomy tube placement. After ensuring that the patient can receive tube feeding outpatient (due to lack of insurance) patient consented to undergo jejunostomy tube placement. Was started on tube feeding and as the patient developed ileus likely from narcotics due to refusal of bowel regimen patient has been refusing tube feeds since. Currently continues to refuse tube feeds and wants to try orally. Will switch boost to Ensure and monitor. Will also initiate calorie count. TPN was considered but currently not initiated since his GI tract  appears to be functioning well now. Continue to educate the patient in regards to importance of nutrition.  Colonic ileus. GI & surgery seeing the patient.  Surgery currently signed off. Last x-ray on 12/28 shows multiple air-fluid level with no significant obstruction. Continue bowel regimen. Add simethicone. Minimize narcotics. Repeat x-ray tomorrow.  Cancer-related pain. Palliative care helping out with pain management. Patient is on methadone, dexamethasone, Dilaudid as needed and Robaxin injection twice daily. Will monitor progression.  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 Oncology following. Also seen in the hospital. Eventual plan is to initiate outpatient chemotherapy as well as radiation. Currently patient needs to work on his nutritional status as well as functional status which appears to be very poor right now.  Near syncope. Presented with dizziness and lightheadedness as well as near syncope in the clinic while having blood draws. Currently ongoing dizziness reported by the patient. Blood pressure soft.  Will check orthostatic vitals. Telemetry evaluation unremarkable. Carotid Doppler normal. Echocardiogram EF 60-65%, no significant valvular abnormality. Initial presentation could be secondary to uncontrolled pain versus vasovagal event. His ongoing dizziness is likely secondary to pain medications. Check orthostatic.  Anemia secondary to nutritional deficiency as well as chronic GI blood loss. Hemoglobin and November 13. Hemoglobin dropped down to 5.3 on 12/24 requiring blood transfusion. Currently hemoglobin continues to have a downward trend of 7.2 hemoglobin. Iron level 20, 12/13.  No vitamin levels checked. Will check labs and replace as needed. Given his poor oral intake would benefit from IV iron. Transfuse for hemoglobin less than 7. Avoiding pharmacological DVT prophylaxis.  Goals of care conversation. I had a candid conversation with patient  with regards to his goals of care on 12/29.  Patient clearly has been refusing his medications in the hospital as prescribed. Patient also clearly refusing to initiate tube feedings. While understandably patient's concern that if he is feeling full and he would like to stop his tube feeding at that time is appropriate, patient was educated with regards to importance of nutrition and healing as well as his ability to tolerate chemotherapy or even being considered a candidate for 1. Also informed the patient that he is colonic ileus is secondary to narcotics side effect and pain medication and bowel regimen will go hand-in-hand even if he has a bowel movement. Patient verbalized understanding. Will initiate calorie count. At patient's request we will switch boost to ensure. Monitor progression.

## 2023-06-15 NOTE — Progress Notes (Signed)
  Echocardiogram 2D Echocardiogram has been performed.  Jesus Haynes 06/15/2023, 5:09 PM

## 2023-06-15 NOTE — Progress Notes (Signed)
PROGRESS NOTE    Jesus Haynes  XBJ:[id number] DOB: October 03, 1975 DOA: 06/14/2023 PCP: Ivonne Andrew, NP    Brief Narrative:   Jesus Haynes is a 47 y.o. male with medical history significant of asthma, adenocarcinoma of the gastroesophageal junction, cancer associated pain, history of GI bleed, anemia due to chronic blood loss was sent from the cancer center after he had a near syncopal episode while they were drawing multiple vials of blood.  He did not agree to further labs in the hospital.  Assessment and plan.  Failure to thrive in adult secondary to adenocarcinoma of gastroesophageal junction (HCC) Cancer associated pain Pain management has been switched to hydromorphone.  Oxycodone has not been effective.  PEG placement discussed with general surgery.  May need laparoscopic or open G/J-tube with reassessment by surgery on Monday if unable to tolerate orally.  Will focus on adequate pain management for now to see if he can safely swallow.  Patient received IV fluids    Pre-syncope Received IV fluids.  Check 2D echocardiogram.    Anemia due to chronic blood loss Hemoglobin on 06/13/2021 at 9.3.  Will transfuse for hemoglobin less than 10.     Asthma Continue albuterol MDI as needed.      DVT prophylaxis: SCDs Start: 06/14/23 1621   Code Status:     Code Status: Full Code  Disposition: Uncertain at this time.  Status is: Inpatient Remains inpatient appropriate because: Intractable pain, IV narcotics.   Family Communication: Spoke with the patient's stepmother at bedside.  Consultants:  General surgery.  Procedures:  None yet  Antimicrobials:  None  Anti-infectives (From admission, onward)    None      Subjective: Today, patient was seen and examined at bedside.  Patient states that that his pain is not controlled on the current pain medication regimen.  He however wishes to see if he can eat.  Objective: Vitals:   06/14/23 1552 06/14/23 2034  06/15/23 0436 06/15/23 1318  BP: (!) 154/102 124/81 115/76 113/68  Pulse: 84 79 74 78  Resp: 18 18 18 16   Temp: 98.2 F (36.8 C) 98 F (36.7 C) 98 F (36.7 C) 98.2 F (36.8 C)  TempSrc: Oral Oral Oral Oral  SpO2: 97% 98% 97% 94%    Intake/Output Summary (Last 24 hours) at 06/15/2023 1525 Last data filed at 06/15/2023 0900 Gross per 24 hour  Intake 1383.64 ml  Output --  Net 1383.64 ml   There were no vitals filed for this visit.  Physical Examination: There is no height or weight on file to calculate BMI.   General:  Average built, in mild distress due to pain. HENT:   No scleral pallor or icterus noted. Oral mucosa is moist.  Chest: .  Diminished breath sounds bilaterally. No crackles or wheezes.  CVS: S1 &S2 heard. No murmur.  Regular rate and rhythm. Abdomen: Soft, nontender, nondistended.  Bowel sounds are heard.   Extremities: No cyanosis, clubbing or edema.  Peripheral pulses are palpable. Psych: Alert, awake and oriented, normal mood CNS:  No cranial nerve deficits.  Power equal in all extremities.   Skin: Warm and dry.  No rashes noted.  Data Reviewed:   CBC: Recent Labs  Lab 06/12/23 1413 06/14/23 2005 06/15/23 0731  WBC 9.6 9.0 6.7  HGB 10.5* 9.3* 8.4*  HCT 34.2* 30.4* 27.8*  MCV 82 81.7 82.7  PLT 543* 430* 345    Basic Metabolic Panel: Recent Labs  Lab 06/12/23 1413 06/14/23  2005 06/15/23 0731  NA 139 134* 136  K 5.0 3.9 3.8  CL 101 104 107  CO2 16* 21* 24  GLUCOSE 109* 100* 106*  BUN 14 15 14   CREATININE 0.80 0.85 0.72  CALCIUM 9.6 8.7* 8.5*    Liver Function Tests: Recent Labs  Lab 06/12/23 1413 06/14/23 2005 06/15/23 0731  AST 18 17 16   ALT 19 21 20   ALKPHOS 69 49 47  BILITOT 0.4 0.7 0.6  PROT 7.0 6.7 6.0*  ALBUMIN 3.9* 3.5 3.1*     Radiology Studies: No results found.    LOS: 1 day    Joycelyn Das, MD [facility] Available via Epic secure chat 7am-7pm After these hours, please refer to coverage provider  listed on [email or web address] 06/15/2023, 3:25 PM

## 2023-06-15 NOTE — Plan of Care (Signed)
  Problem: Clinical Measurements: Goal: Ability to maintain clinical measurements within normal limits will improve Outcome: Progressing Goal: Will remain free from infection Outcome: Progressing Goal: Diagnostic test results will improve Outcome: Progressing Goal: Respiratory complications will improve Outcome: Progressing Goal: Cardiovascular complication will be avoided Outcome: Progressing   Problem: Activity: Goal: Risk for activity intolerance will decrease Outcome: Progressing   Problem: Nutrition: Goal: Adequate nutrition will be maintained Outcome: Progressing   Problem: Coping: Goal: Level of anxiety will decrease Outcome: Progressing   Problem: Elimination: Goal: Will not experience complications related to bowel motility Outcome: Progressing Goal: Will not experience complications related to urinary retention Outcome: Progressing   Problem: Pain Management: Goal: General experience of comfort will improve Outcome: Progressing   Problem: Safety: Goal: Ability to remain free from injury will improve Outcome: Progressing

## 2023-06-15 NOTE — Plan of Care (Signed)

## 2023-06-16 ENCOUNTER — Inpatient Hospital Stay (HOSPITAL_COMMUNITY): Payer: Medicaid Other

## 2023-06-16 DIAGNOSIS — R55 Syncope and collapse: Secondary | ICD-10-CM | POA: Diagnosis not present

## 2023-06-16 DIAGNOSIS — R627 Adult failure to thrive: Secondary | ICD-10-CM | POA: Diagnosis not present

## 2023-06-16 LAB — BASIC METABOLIC PANEL
Anion gap: 7 (ref 5–15)
BUN: 11 mg/dL (ref 6–20)
CO2: 22 mmol/L (ref 22–32)
Calcium: 8.1 mg/dL — ABNORMAL LOW (ref 8.9–10.3)
Chloride: 105 mmol/L (ref 98–111)
Creatinine, Ser: 0.58 mg/dL — ABNORMAL LOW (ref 0.61–1.24)
GFR, Estimated: >60 mL/min (ref >60)
Glucose, Bld: 96 mg/dL (ref 70–99)
Potassium: 3.9 mmol/L (ref 3.5–5.1)
Sodium: 134 mmol/L — ABNORMAL LOW (ref 135–145)

## 2023-06-16 LAB — CBC
HCT: 28.1 % — ABNORMAL LOW (ref 39.0–52.0)
Hemoglobin: 8.4 g/dL — ABNORMAL LOW (ref 13.0–17.0)
MCH: 24.5 pg — ABNORMAL LOW (ref 26.0–34.0)
MCHC: 29.9 g/dL — ABNORMAL LOW (ref 30.0–36.0)
MCV: 81.9 fL (ref 80.0–100.0)
Platelets: 293 10*3/uL (ref 150–400)
RBC: 3.43 MIL/uL — ABNORMAL LOW (ref 4.22–5.81)
RDW: 13.8 % (ref 11.5–15.5)
WBC: 6.6 10*3/uL (ref 4.0–10.5)
nRBC: 0 % (ref 0.0–0.2)

## 2023-06-16 LAB — MAGNESIUM: Magnesium: 1.7 mg/dL (ref 1.7–2.4)

## 2023-06-16 MED ORDER — HYDROMORPHONE HCL 1 MG/ML PO LIQD
1.5000 mg | ORAL | Status: DC | PRN
  Administered 2023-06-16: 1.5 mg via ORAL
  Filled 2023-06-16: qty 2

## 2023-06-16 NOTE — Progress Notes (Addendum)
PROGRESS NOTE    MACKSON SENA  RUE:[id number] DOB: [birth year] DOA: 06/14/2023 PCP: Ivonne Andrew, NP    Brief Narrative:   Jesus Haynes is a 47 y.o. male with medical history significant of asthma, adenocarcinoma of the gastroesophageal junction, cancer associated pain, history of GI bleed, anemia due to chronic blood loss was sent from the cancer center after he had a near syncopal episode while they were drawing multiple vials of blood.  Patient was then sent to the hospital for further evaluation and treatment.  Assessment and plan.  Failure to thrive in adult secondary to adenocarcinoma of gastroesophageal junction (HCC) Cancer associated pain He is currently on IV hydromorphone.  Nursing staff reported that he has been on the clock with IV narcotics.  Discussed that long-term goal would be to limit the pain to be able to function and that transition to oral narcotic at some point is the goal.  He understands that but has not been quite ready for transition due to ongoing pain.  Oxycodone has not been effective as outpatient..  Will increase the dose of oral Dilaudid and keep the same dose of IV Dilaudid for today.  Aim is to gradually transition to oral Dilaudid likely in liquid form.  May need laparoscopic or open G/J-tube if unable to tolerate orally but patient states that he was able to tolerate some full liquids yesterday.  General surgery to follow on Monday to reassess the need for any PEG tube/J-tube.  Patient is going to try to eat more today.   Continue IV fluids if unable to take orally.  Will consult palliative care for pain management.    Pre-syncope Received IV fluids.  2D echocardiogram with LV ejection fraction of 60 to 65% will monitor closely...    Anemia due to chronic GI blood loss Hemoglobin on 06/13/2021 at 8.4 from 9.3.  Will transfuse for hemoglobin less than 10.  No evidence of bleeding noted.     Asthma Continue albuterol MDI as needed.  Appears  compensated at this time     DVT prophylaxis: SCDs Start: 06/14/23 1621   Code Status:     Code Status: Full Code  Disposition: Home likely  in 1 to 2 days when adequate pain control.  Status is: Inpatient Remains inpatient appropriate because: Intractable pain, IV narcotics, pending adequate pain control..   Family Communication:  Spoke with the patient's stepmother at bedside on 06/15/2023.  Consultants:  General surgery Palliative care  Procedures:  None yet  Antimicrobials:  None  Anti-infectives (From admission, onward)    None      Subjective: Today, patient was seen and examined at bedside.  Patient states that he has been hurting and requiring pain medicines every 3 hours which seems to intermittently help.  He was able to take some liquids yesterday but has not tried real solid food yet.  Did not throw up.    Objective: Vitals:   06/15/23 2122 06/16/23 0618 06/16/23 0813 06/16/23 0815  BP: (!) 139/92 (!) 130/93    Pulse: 82 74    Resp: 18 18    Temp: 98.1 F (36.7 C) 97.7 F (36.5 C)    TempSrc: Oral Oral    SpO2: 97% 98% 99% 99%  Weight:      Height:        Intake/Output Summary (Last 24 hours) at 06/16/2023 1103 Last data filed at 06/16/2023 0405 Gross per 24 hour  Intake 1240.55 ml  Output --  Net  1240.55 ml   Filed Weights   06/15/23 2053  Weight: 80.6 kg    Physical Examination: Body mass index is 28.68 kg/m.   General:  Average built, appears to be anxious, HENT:   No scleral pallor or icterus noted. Oral mucosa is moist.  Chest: .  Diminished breath sounds bilaterally. No crackles or wheezes.  CVS: S1 &S2 heard. No murmur.  Regular rate and rhythm. Abdomen: Soft, no tenderness over the abdomen, bowel sounds are heard.   Extremities: No cyanosis, clubbing or edema.  Peripheral pulses are palpable. Psych: Alert, awake and oriented, normal mood CNS:  No cranial nerve deficits.  Power equal in all extremities.   Skin: Warm and  dry.  No rashes noted.  Data Reviewed:   CBC: Recent Labs  Lab 06/12/23 1413 06/14/23 2005 06/15/23 0731 06/16/23 0931  WBC 9.6 9.0 6.7 6.6  HGB 10.5* 9.3* 8.4* 8.4*  HCT 34.2* 30.4* 27.8* 28.1*  MCV 82 81.7 82.7 81.9  PLT 543* 430* 345 293    Basic Metabolic Panel: Recent Labs  Lab 06/12/23 1413 06/14/23 2005 06/15/23 0731 06/16/23 0931  NA 139 134* 136 134*  K 5.0 3.9 3.8 3.9  CL 101 104 107 105  CO2 16* 21* 24 22  GLUCOSE 109* 100* 106* 96  BUN 14 15 14 11   CREATININE 0.80 0.85 0.72 0.58*  CALCIUM 9.6 8.7* 8.5* 8.1*  MG  --   --   --  1.7    Liver Function Tests: Recent Labs  Lab 06/12/23 1413 06/14/23 2005 06/15/23 0731  AST 18 17 16   ALT 19 21 20   ALKPHOS 69 49 47  BILITOT 0.4 0.7 0.6  PROT 7.0 6.7 6.0*  ALBUMIN 3.9* 3.5 3.1*     Radiology Studies: ECHOCARDIOGRAM COMPLETE Result Date: 06/15/2023    ECHOCARDIOGRAM REPORT   Patient Name:   Jesus Haynes Date of Exam: 06/15/2023 Medical Rec #:  [id number]      Height:       66.0 in Accession #:    [id number]     Weight:       174.0 lb Date of Birth:  [birth year]     BSA:          1.885 m Patient Age:    47 years       BP:           113/68 mmHg Patient Gender: M              HR:           70 bpm. Exam Location:  Inpatient Procedure: 2D Echo Indications:   syncope  History:       Patient has no prior history of Echocardiogram examinations.                Cancer.  Sonographer:   Delcie Roch RDCS Referring      [phone] DAVID MANUEL ORTIZ Phys: IMPRESSIONS  1. Left ventricular ejection fraction, by estimation, is 60 to 65%. The left ventricle has normal function. The left ventricle has no regional wall motion abnormalities. Left ventricular diastolic parameters were normal.  2. Right ventricular systolic function is normal. The right ventricular size is normal.  3. The mitral valve is normal in structure. No evidence of mitral valve regurgitation. No evidence of mitral stenosis.  4. The aortic valve is normal  in structure. Aortic valve regurgitation is not visualized. No aortic stenosis is present.  5. The inferior vena cava is normal in  size with greater than 50% respiratory variability, suggesting right atrial pressure of 3 mmHg. FINDINGS  Left Ventricle: Left ventricular ejection fraction, by estimation, is 60 to 65%. The left ventricle has normal function. The left ventricle has no regional wall motion abnormalities. The left ventricular internal cavity size was normal in size. There is  no left ventricular hypertrophy. Left ventricular diastolic parameters were normal. Right Ventricle: The right ventricular size is normal. No increase in right ventricular wall thickness. Right ventricular systolic function is normal. Left Atrium: Left atrial size was normal in size. Right Atrium: Right atrial size was normal in size. Pericardium: There is no evidence of pericardial effusion. Mitral Valve: The mitral valve is normal in structure. No evidence of mitral valve regurgitation. No evidence of mitral valve stenosis. Tricuspid Valve: The tricuspid valve is normal in structure. Tricuspid valve regurgitation is not demonstrated. No evidence of tricuspid stenosis. Aortic Valve: The aortic valve is normal in structure. Aortic valve regurgitation is not visualized. No aortic stenosis is present. Pulmonic Valve: The pulmonic valve was normal in structure. Pulmonic valve regurgitation is not visualized. No evidence of pulmonic stenosis. Aorta: The aortic root is normal in size and structure. Venous: The inferior vena cava is normal in size with greater than 50% respiratory variability, suggesting right atrial pressure of 3 mmHg. IAS/Shunts: No atrial level shunt detected by color flow Doppler.  LEFT VENTRICLE PLAX 2D LVIDd:         4.70 cm   Diastology LVIDs:         2.60 cm   LV e' medial:    11.20 cm/s LV PW:         0.90 cm   LV E/e' medial:  5.7 LV IVS:        0.90 cm   LV e' lateral:   13.10 cm/s LVOT diam:     2.10 cm   LV  E/e' lateral: 4.9 LV SV:         73 LV SV Index:   39 LVOT Area:     3.46 cm  RIGHT VENTRICLE             IVC RV Basal diam:  2.50 cm     IVC diam: 1.40 cm RV S prime:     17.60 cm/s TAPSE (M-mode): 2.7 cm LEFT ATRIUM             Index        RIGHT ATRIUM           Index LA diam:        3.60 cm 1.91 cm/m   RA Area:     16.20 cm LA Vol (A2C):   42.0 ml 22.28 ml/m  RA Volume:   41.10 ml  21.81 ml/m LA Vol (A4C):   43.5 ml 23.08 ml/m LA Biplane Vol: 44.5 ml 23.61 ml/m  AORTIC VALVE LVOT Vmax:   111.00 cm/s LVOT Vmean:  72.800 cm/s LVOT VTI:    0.210 m  AORTA Ao Root diam: 3.70 cm Ao Asc diam:  3.60 cm MITRAL VALVE MV Area (PHT): 4.39 cm    SHUNTS MV Decel Time: 173 msec    Systemic VTI:  0.21 m MV E velocity: 64.30 cm/s  Systemic Diam: 2.10 cm MV A velocity: 63.00 cm/s MV E/A ratio:  1.02 Mihai Croitoru MD Electronically signed by Thurmon Fair MD Signature Date/Time: 06/15/2023/5:08:37 PM    Final       LOS: 2 days    Zhyon Antenucci,  MD [facility] Available via Epic secure chat 7am-7pm After these hours, please refer to coverage provider listed on [email or web address] 06/16/2023, 11:03 AM

## 2023-06-17 ENCOUNTER — Other Ambulatory Visit: Payer: Self-pay

## 2023-06-17 ENCOUNTER — Encounter: Payer: Self-pay | Admitting: Oncology

## 2023-06-17 DIAGNOSIS — Z7189 Other specified counseling: Secondary | ICD-10-CM

## 2023-06-17 DIAGNOSIS — G893 Neoplasm related pain (acute) (chronic): Secondary | ICD-10-CM | POA: Diagnosis not present

## 2023-06-17 DIAGNOSIS — R627 Adult failure to thrive: Secondary | ICD-10-CM | POA: Diagnosis not present

## 2023-06-17 DIAGNOSIS — Z515 Encounter for palliative care: Secondary | ICD-10-CM | POA: Diagnosis not present

## 2023-06-17 DIAGNOSIS — D500 Iron deficiency anemia secondary to blood loss (chronic): Secondary | ICD-10-CM | POA: Diagnosis not present

## 2023-06-17 DIAGNOSIS — C160 Malignant neoplasm of cardia: Secondary | ICD-10-CM | POA: Diagnosis not present

## 2023-06-17 LAB — MAGNESIUM: Magnesium: 1.7 mg/dL (ref 1.7–2.4)

## 2023-06-17 LAB — CBC
HCT: 26.3 % — ABNORMAL LOW (ref 39.0–52.0)
Hemoglobin: 8.3 g/dL — ABNORMAL LOW (ref 13.0–17.0)
MCH: 25.5 pg — ABNORMAL LOW (ref 26.0–34.0)
MCHC: 31.6 g/dL (ref 30.0–36.0)
MCV: 80.7 fL (ref 80.0–100.0)
Platelets: 295 10*3/uL (ref 150–400)
RBC: 3.26 MIL/uL — ABNORMAL LOW (ref 4.22–5.81)
RDW: 13.8 % (ref 11.5–15.5)
WBC: 6.9 10*3/uL (ref 4.0–10.5)
nRBC: 0 % (ref 0.0–0.2)

## 2023-06-17 LAB — BASIC METABOLIC PANEL
Anion gap: 11 (ref 5–15)
BUN: 11 mg/dL (ref 6–20)
CO2: 20 mmol/L — ABNORMAL LOW (ref 22–32)
Calcium: 8.3 mg/dL — ABNORMAL LOW (ref 8.9–10.3)
Chloride: 105 mmol/L (ref 98–111)
Creatinine, Ser: 0.58 mg/dL — ABNORMAL LOW (ref 0.61–1.24)
GFR, Estimated: >60 mL/min (ref >60)
Glucose, Bld: 100 mg/dL — ABNORMAL HIGH (ref 70–99)
Potassium: 3.5 mmol/L (ref 3.5–5.1)
Sodium: 136 mmol/L (ref 135–145)

## 2023-06-17 MED ORDER — HYDROMORPHONE HCL 1 MG/ML IJ SOLN
1.0000 mg | INTRAMUSCULAR | Status: DC | PRN
  Administered 2023-06-17 – 2023-06-18 (×7): 1 mg via INTRAVENOUS
  Filled 2023-06-17 (×7): qty 1

## 2023-06-17 MED ORDER — SORBITOL 70 % SOLN: 30.0000 mL | Freq: Every day | Status: DC | PRN

## 2023-06-17 MED ORDER — HYDROMORPHONE HCL 1 MG/ML PO LIQD: 2.5000 mg | ORAL | Status: DC | PRN

## 2023-06-17 NOTE — Consult Note (Addendum)
Consultation Note Date: 06/17/2023   Patient Name: Jesus Haynes  DOB: 11-29-1975  MRN: [id number]  Age / Sex: 47 y.o., male  PCP: Ivonne Andrew, NP Referring Physician: Meredeth Ide, MD  Reason for Consultation: Pain control  HPI/Patient Profile: 47 y.o. male  with past medical history of asthma, adenocarcinoma of the gastroesophageal junction, cancer associated pain, GI bleed, anemia admitted on 06/14/2023 with near syncope while drawing labs. Failure to thrive with poor intake and weight loss.   Clinical Assessment and Goals of Care: Consult received and chart review completed. I met today with Jesus Haynes. He has recently received pain medication but able to communicate clearly with me. He reports good relief from IV dilaudid at current dose with minimal pain currently. He does report that pain medication begins to wear off near next dose of 3 hours with IV pain medication. He reports good control today and last night but not prior to last night. He is anxious about changing from IV regimen with fear of having recurrent pain. He does understand that he is likely to continue with pain as we are not able to make the pain go away and stay away but goal is to make pain tolerable enough for him to be able to function and have be able to enjoy life. He is very open to help from palliative care and connecting with palliative care in [facility]. I will make referral. He describes stabbing, gnawing pain constant in epigastric area relieved with dilaudid. Oxycodone helped a little but not much. He is able to tolerate liquids better when he is not in pain - no interest in any po intake when in pain. Denies nausea. Plans for J-tube tomorrow. He asks that I reach out to mother-in-law, Jesus Haynes, who is helping him keep track of his care. He has 4 younger children (not with their mother Jesus Haynes daughter currently) and 3 children from  previous relationship.   I called and spoke with mother-in-law, Jesus Haynes. Jesus Haynes has very good understanding of Jesus Haynes's condition. We discuss pain management and she is very happy to hear that someone will follow and assist as she felt that this was not being addressed properly over the weekend by providers or nursing staff. She expresses concerns that Jesus Haynes also mentions and I will speak with unit nursing director and follow up on concerns. Jesus Haynes expresses desire that [name]'s pain will be properly addressed as she understands that his prognosis is poor. Jesus Haynes shares that Jesus Haynes does not yet know the extent of his cancer as he was hospitalized before they were supposed to meet with oncology to discuss. Jesus Haynes also shares that she has spoken with Jesus Haynes about Living Will and HCPOA today - chaplain assisted. She has brought up DNR to Jesus Haynes as well. Jesus Haynes reports that [name] is very faithful and spiritual and does have a peace and acceptance of his condition and is open to these conversations. Jesus Haynes even questions the risks vs benefits of chemotherapy - valid concern and recommend further discussion with Vannie and  Dr. Arlana Pouch. We will speak more tomorrow at bedside.   All questions/concerns addressed to best of my ability. Emotional support provided.   Primary Decision Maker PATIENT    SUMMARY OF RECOMMENDATIONS   - Goal is pain management - Ongoing goals of care conversations needed  Code Status/Advance Care Planning: Full code - did not address today   Symptom Management:  Epigastric stabbing, gnawing constant pain: Continue dilaudid 1 mg IV q3h PRN for now. Recommend dilaudid suspension 3 mg every 4 hours PRN when PEG in place and able to use.  Low threshold for addition of fentanyl patch 25 mcg/hr.  Could consider trial of decadron.  Consider trial of meloxicam.  Does not seem to have neuropathic component. Do not see role for muscle relaxer.  Bowel Regimen: Sorbitol daily PRN.  Recommend senokot  suspension scheduled when PEG placed.   Psycho-social/Spiritual:  Desire for further Chaplaincy support:yes Additional Recommendations: [facility] Referral and Medicaid/Financial Assistance  Prognosis:  Overall prognosis poor.   Discharge Planning: To Be Determined      Primary Diagnoses: Present on Admission:  Pre-syncope  Failure to thrive in adult  Anemia due to chronic blood loss  Adenocarcinoma of gastroesophageal junction (HCC)  Cancer associated pain  Asthma   I have reviewed the medical record, interviewed the patient and family, and examined the patient. The following aspects are pertinent.  Past Medical History:  Diagnosis Date   Asthma    Social History   Socioeconomic History   Marital status: Single    Spouse name: Not on file   Number of children: Not on file   Years of education: Not on file   Highest education level: Not on file  Occupational History   Not on file  Tobacco Use   Smoking status: Former    Current packs/day: 0.00    Types: Cigarettes    Quit date: 12/26/2012    Years since quitting: 10.4   Smokeless tobacco: Never  Vaping Use   Vaping status: Former   Quit date: 08/21/2018   Substances: Nicotine  Substance and Sexual Activity   Alcohol use: No   Drug use: No   Sexual activity: Not Currently  Other Topics Concern   Not on file  Social History Narrative   Not on file   Social Drivers of Health   Financial Resource Strain: Not on file  Food Insecurity: No Food Insecurity (06/14/2023)   Hunger Vital Sign    Worried About Running Out of Food in the Last Year: Never true    Ran Out of Food in the Last Year: Never true  Transportation Needs: No Transportation Needs (06/14/2023)   PRAPARE - Administrator, Civil Service (Medical): No    Lack of Transportation (Non-Medical): No  Physical Activity: Not on file  Stress: Not on file  Social Connections: Not on file   Family History  Problem Relation Age of Onset    Heart failure Father    Scheduled Meds:  feeding supplement  237 mL Oral BID BM   Continuous Infusions: PRN Meds:.acetaminophen **OR** acetaminophen, albuterol, HYDROmorphone (DILAUDID) injection, HYDROmorphone HCl, ondansetron **OR** ondansetron (ZOFRAN) IV No Known Allergies Review of Systems  Constitutional:  Positive for appetite change.  Gastrointestinal:  Positive for abdominal pain.  Psychiatric/Behavioral:  Positive for sleep disturbance.     Physical Exam Vitals and nursing note reviewed.  Constitutional:      General: He is awake. He is not in acute distress.    Appearance: Normal  appearance.  Cardiovascular:     Rate and Rhythm: Normal rate.  Pulmonary:     Effort: No tachypnea, accessory muscle usage or respiratory distress.  Abdominal:     General: Abdomen is flat.     Palpations: Abdomen is soft.  Neurological:     Mental Status: He is alert and oriented to person, place, and time.     Vital Signs: BP (!) 151/91 (BP Location: Left Arm)   Pulse 82   Temp 98.5 F (36.9 C) (Oral)   Resp 18   Ht 5\' 6"  (1.676 m)   Wt 80.6 kg   SpO2 99%   BMI 28.68 kg/m  Pain Scale: 0-10 POSS *See Group Information*: 1-Acceptable,Awake and alert Pain Score: 2    SpO2: SpO2: 99 % O2 Device:SpO2: 99 % O2 Flow Rate: .   IO: Intake/output summary:  Intake/Output Summary (Last 24 hours) at 06/17/2023 1529 Last data filed at 06/17/2023 1018 Gross per 24 hour  Intake --  Output 800 ml  Net -800 ml    LBM: Last BM Date : 06/07/23 Baseline Weight: Weight: 80.6 kg Most recent weight: Weight: 80.6 kg     Palliative Assessment/Data:    Time Total: 80 min  Greater than 50%  of this time was spent counseling and coordinating care related to the above assessment and plan.  Signed by: Yong Channel, NP Palliative Medicine Team Pager # [phone] (M-F 8a-5p) Team Phone # [phone] (Nights/Weekends)

## 2023-06-17 NOTE — Progress Notes (Signed)
Nutrition Assessment   Reason for Assessment: Urgent add on per infusion charge RN    ASSESSMENT: 47 year old male with newly diagnosed metastatic adenocarcinoma of GE junction. Treatment plan currently under work-up. Patient is under the care of Dr. Arlana Pouch.   11/25-11/28 - admission with UGI, melena secondary to cancer of GE junction 12/13 - direct admit with FTT  Patient noted with presyncopal episode at lab draw. Patient receiving IV hydration at time of RD visit. Patient covered under multiple blankets and laid back in chair. He is in and out of sleep and appears in pain when awake. Mother-in-law present who provides most of history. Patient has had minimal po intake in the last week secondary to dysphagia. Recalls bites/sips of broth and gravy. He allows crackers to dissolve in mouth prior to taking medications. Patient tolerated small amount of ginger ale this morning. He has had intermittent epigastric pain, specifically after eating as well as nausea/regurgitation ongoing for the last couple of months. Patient says he has felt generally unwell for the last 3 years. He denies diarrhea. Patient's last BM was ~one week ago. He is passing gas. Patient is not on bowel regimen.    Nutrition Focused Physical Exam: unable to complete secondary to pain and level of alertness at visit   Medications: dilaudid, zofran, roxicodone, protonix, trazodone   Labs: Na 134, Hgb 9.3   Anthropometrics: Weights have decreased ~15% (26 lb) from reported usual weight in the last 2 months - this is severe for time frame  Height: 5'6" Weight: 177 lb 11.1 oz  UBW: 190-200 lb (per pt) BMI: 28.68   NUTRITION DIAGNOSIS: Inadequate oral intake related to dysphagia secondary to cancer of GE junction as evidenced by nausea, epigastric pain with po, dietary recall, severe wt loss   MALNUTRITION DIAGNOSIS: Highly suspect degree of malnutrition given recent history and weight loss, however unable to complete  NFPE at this time to assess for fat/muscle depletion   INTERVENTION:  Discussed high probability of placing feeding tube for nutrition/hydration/medications - pt is agreeable  Consider bowel regimen  Appreciative of LCSW assistance/support provided to patient and care giver Monitor for discharge/treatment plan/GOC   MONITORING, EVALUATION, GOAL: Patient will tolerate increased calories and protein to minimize further wt loss   Next Visit: To be determined

## 2023-06-17 NOTE — Progress Notes (Signed)
Initial Nutrition Assessment  DOCUMENTATION CODES:   Non-severe (moderate) malnutrition in context of chronic illness  INTERVENTION:   Once J-tube placed and ready to use: -Monitor magnesium, potassium, and phosphorus for at least 3 days, MD to replete as needed, as pt is at risk for refeeding syndrome. -Recommend Thiamine 100 mg daily for at least 5 days  -Initiate Osmolite 1.5 @ 20 ml/hr, advance by 10 ml every 12 hours to goal rate of 70 ml/hr -Provides 2520 kcals, 105g protein and 1280 ml H2O  -Ensure Plus High Protein po BID, each supplement provides 350 kcal and 20 grams of protein.   NUTRITION DIAGNOSIS:   Moderate Malnutrition related to chronic illness, cancer and cancer related treatments as evidenced by energy intake < or equal to 75% for > or equal to 1 month, mild fat depletion, percent weight loss.  GOAL:   Patient will meet greater than or equal to 90% of their needs  MONITOR:   Labs, Weight trends, I & O's, PO intake, Supplement acceptance, TF tolerance  REASON FOR ASSESSMENT:   Consult, Malnutrition Screening Tool [facility])   ASSESSMENT:   47 y.o. male with medical history significant of asthma, adenocarcinoma of the gastroesophageal junction, cancer associated pain, history of GI bleed, anemia due to chronic blood loss was sent from the cancer center after he had a near syncopal episode while they were drawing multiple vials of blood.  RD alerted to admission from [facility] RD who is following patient.  Per surgery note, plan is for surgical placement of J-tube tomorrow.  Per oncology note 12/13: plan is for chemotherapy.   Patient in room with mother at bedside.  Pt reports not eating much at all for weeks since symptoms started and pain worsened. Pt has had no appetite d/t pain levels. Currently on full liquids but not taking in much. PTA pt was having dysphagia as well.   Alerted CM to pt's plan for a J-tube placement. Pt with no  insurance so will need to investigate options.   Per patient UBW ~200 lbs Current weight: 177 lbs.  Per weight records, pt has lost 22 lbs since 10/27 (11% wt loss x 1.5 months, significant for time frame).   Medications reviewed.  Labs reviewed: Low iron   NUTRITION - FOCUSED PHYSICAL EXAM:  Flowsheet Row Most Recent Value  Orbital Region Mild depletion  Upper Arm Region No depletion  Thoracic and Lumbar Region No depletion  Buccal Region Mild depletion  Temple Region No depletion  Clavicle Bone Region No depletion  Clavicle and Acromion Bone Region No depletion  Scapular Bone Region No depletion  Dorsal Hand No depletion  Patellar Region No depletion  Anterior Thigh Region No depletion  Posterior Calf Region No depletion  Edema (RD Assessment) None  Hair Reviewed  Eyes Reviewed  Mouth Reviewed  Skin Reviewed  Nails Reviewed       Diet Order:   Diet Order             Diet NPO time specified Except for: Sips with Meds  Diet effective midnight           Diet full liquid Room service appropriate? Yes; Fluid consistency: Thin  Diet effective now                   EDUCATION NEEDS:   Education needs have been addressed  Skin:  Skin Assessment: Reviewed RN Assessment  Last BM:  12/6  Height:   Ht  Readings from Last 1 Encounters:  06/15/23 5\' 6"  (1.676 m)    Weight:   Wt Readings from Last 1 Encounters:  06/15/23 80.6 kg    BMI:  Body mass index is 28.68 kg/m.  Estimated Nutritional Needs:   Kcal:  2200-2400  Protein:  105-115g  Fluid:  2.2L/day   Tilda Franco, MS, RD, LDN Inpatient Clinical Dietitian Contact via Secure chat

## 2023-06-17 NOTE — Plan of Care (Signed)
  Problem: Education: Goal: Knowledge of General Education information will improve Description Including pain rating scale, medication(s)/side effects and non-pharmacologic comfort measures Outcome: Progressing   Problem: Health Behavior/Discharge Planning: Goal: Ability to manage health-related needs will improve Outcome: Progressing   

## 2023-06-17 NOTE — Progress Notes (Signed)
[facility] Progress Note     Subjective: CC:  Still reports issues with pain control. States he gets nauseated and has poor oral intake when his pain is not controlled. Does report poor oral intake for weeks, stating that when he is not in pain he has an appetite but cannot eat/drink much. Endorses dry heaves but denies emesis. Reports flatus. States his last BM was one week ago.  Denies a history of abdominal surgery. Says he was previously living along but plans to stay with his daughter, who is in her 69's, when he is discharged.  Objective: Vital signs in last 24 hours: Temp:  [98.2 F (36.8 C)-98.8 F (37.1 C)] 98.2 F (36.8 C) (12/16 0627) Pulse Rate:  [79-99] 79 (12/16 0627) Resp:  [18] 18 (12/16 0627) BP: (127-152)/(86-97) 127/86 (12/16 0627) SpO2:  [97 %-99 %] 99 % (12/16 0627) Last BM Date : 06/07/23  Intake/Output from previous day: 12/15 0701 - 12/16 0700 In: -  Out: 650 [Urine:650] Intake/Output this shift: No intake/output data recorded.  PE: Gen:  Alert, NAD, dry heaving  Card:  Regular rate and rhythm Pulm:  Normal effort ORA Abd: Soft, mild epigastric fullness and tenderness without guarding or peritonitis, no hernias or masses  Skin: warm and dry, no rashes  Psych: A&Ox3   Lab Results:  Recent Labs    06/15/23 0731 06/16/23 0931  WBC 6.7 6.6  HGB 8.4* 8.4*  HCT 27.8* 28.1*  PLT 345 293   BMET Recent Labs    06/15/23 0731 06/16/23 0931  NA 136 134*  K 3.8 3.9  CL 107 105  CO2 24 22  GLUCOSE 106* 96  BUN 14 11  CREATININE 0.72 0.58*  CALCIUM 8.5* 8.1*   PT/INR No results for input(s): "LABPROT", "INR" in the last 72 hours. CMP     Component Value Date/Time   NA 134 (L) 06/16/2023 0931   NA 139 06/12/2023 1413   K 3.9 06/16/2023 0931   CL 105 06/16/2023 0931   CO2 22 06/16/2023 0931   GLUCOSE 96 06/16/2023 0931   BUN 11 06/16/2023 0931   BUN 14 06/12/2023 1413   CREATININE 0.58 (L) 06/16/2023 0931   CALCIUM 8.1  (L) 06/16/2023 0931   PROT 6.0 (L) 06/15/2023 0731   PROT 7.0 06/12/2023 1413   ALBUMIN 3.1 (L) 06/15/2023 0731   ALBUMIN 3.9 (L) 06/12/2023 1413   AST 16 06/15/2023 0731   ALT 20 06/15/2023 0731   ALKPHOS 47 06/15/2023 0731   BILITOT 0.6 06/15/2023 0731   BILITOT 0.4 06/12/2023 1413   GFRNONAA >60 06/16/2023 0931   GFRAA >60 08/24/2018 0750   Lipase     Component Value Date/Time   LIPASE 142 (H) 05/27/2023 1102       Studies/Results: VAS US CAROTID Result Date: 06/17/2023 Carotid Arterial Duplex Study Patient Name:  Jesus Haynes  Date of Exam:   06/16/2023 Medical Rec #: [id number]       Accession #:    [id number] Date of Birth: 1976/01/21      Patient Gender: M Patient Age:  47 years Exam Location:  [facility] Procedure:      VAS US CAROTID Referring Phys: DAVID ORTIZ --------------------------------------------------------------------------------  Indications:       Syncope. Comparison Study:  No prior exam. Performing Technologist: Fernande Bras  Examination Guidelines: A complete evaluation includes B-mode imaging, spectral Doppler, color Doppler, and power Doppler as needed of all accessible portions of each vessel. Bilateral  testing is considered an integral part of a complete examination. Limited examinations for reoccurring indications may be performed as noted.  Right Carotid Findings: +----------+--------+--------+--------+------------------+--------+           PSV cm/sEDV cm/sStenosisPlaque DescriptionComments +----------+--------+--------+--------+------------------+--------+ CCA Prox  125     26                                         +----------+--------+--------+--------+------------------+--------+ CCA Distal101     25                                         +----------+--------+--------+--------+------------------+--------+ ICA Prox  71      24                                tortuous  +----------+--------+--------+--------+------------------+--------+ ICA Mid   75      27                                tortuous +----------+--------+--------+--------+------------------+--------+ ICA Distal63      27                                tortuous +----------+--------+--------+--------+------------------+--------+ ECA       81      13                                         +----------+--------+--------+--------+------------------+--------+ +----------+--------+-------+------------+-------------------+           PSV cm/sEDV cmsDescribe    Arm Pressure (mmHG) +----------+--------+-------+------------+-------------------+ Subclavian               Not assessed                    +----------+--------+-------+------------+-------------------+ +---------+--------+--+--------+--+ VertebralPSV cm/s74EDV cm/s24 +---------+--------+--+--------+--+ Recent port placement, right subclavian artery not visualized due to bandages. Left Carotid Findings: +----------+--------+--------+--------+------------------+--------+           PSV cm/sEDV cm/sStenosisPlaque DescriptionComments +----------+--------+--------+--------+------------------+--------+ CCA Prox  141     24                                         +----------+--------+--------+--------+------------------+--------+ CCA Distal95      21                                         +----------+--------+--------+--------+------------------+--------+ ICA Prox  117     39                                         +----------+--------+--------+--------+------------------+--------+ ICA Mid   102     30                                         +----------+--------+--------+--------+------------------+--------+  ICA Distal95      38                                         +----------+--------+--------+--------+------------------+--------+ ECA       97      13                                          +----------+--------+--------+--------+------------------+--------+ +----------+--------+--------+--------+-------------------+           PSV cm/sEDV cm/sDescribeArm Pressure (mmHG) +----------+--------+--------+--------+-------------------+ [id number]                                         +----------+--------+--------+--------+-------------------+ +---------+--------+--+--------+--+ VertebralPSV cm/s71EDV cm/s28 +---------+--------+--+--------+--+   Summary: Right Carotid: The ECA appears <50% stenosed. The extracranial vessels were                near-normal with only minimal wall thickening or plaque. Left Carotid: Velocities in the left ICA are consistent with a 1-39% stenosis.               The ECA appears <50% stenosed. Vertebrals:  Bilateral vertebral arteries demonstrate antegrade flow. Subclavians: Right subclavian artery was not visualized. Normal flow              hemodynamics were seen in the left subclavian artery. *See table(s) above for measurements and observations.  Electronically signed by Heath Lark on 06/17/2023 at 7:33:58 AM.    Final    ECHOCARDIOGRAM COMPLETE Result Date: 06/15/2023    ECHOCARDIOGRAM REPORT   Patient Name:   Jesus Haynes Date of Exam: 06/15/2023 Medical Rec #:  [id number]      Height:       66.0 in Accession #:    [id number]     Weight:       174.0 lb Date of Birth:  1975-11-17     BSA:          1.885 m Patient Age:    47 years       BP:           113/68 mmHg Patient Gender: M              HR:           70 bpm. Exam Location:  Inpatient Procedure: 2D Echo Indications:   syncope  History:       Patient has no prior history of Echocardiogram examinations.                Cancer.  Sonographer:   Delcie Roch RDCS Referring      [phone] DAVID MANUEL ORTIZ Phys: IMPRESSIONS  1. Left ventricular ejection fraction, by estimation, is 60 to 65%. The left ventricle has normal function. The left ventricle has no regional wall motion abnormalities.  Left ventricular diastolic parameters were normal.  2. Right ventricular systolic function is normal. The right ventricular size is normal.  3. The mitral valve is normal in structure. No evidence of mitral valve regurgitation. No evidence of mitral stenosis.  4. The aortic valve is normal in structure. Aortic valve regurgitation is not visualized. No aortic stenosis is present.  5. The inferior vena cava is normal in size with greater  than 50% respiratory variability, suggesting right atrial pressure of 3 mmHg. FINDINGS  Left Ventricle: Left ventricular ejection fraction, by estimation, is 60 to 65%. The left ventricle has normal function. The left ventricle has no regional wall motion abnormalities. The left ventricular internal cavity size was normal in size. There is  no left ventricular hypertrophy. Left ventricular diastolic parameters were normal. Right Ventricle: The right ventricular size is normal. No increase in right ventricular wall thickness. Right ventricular systolic function is normal. Left Atrium: Left atrial size was normal in size. Right Atrium: Right atrial size was normal in size. Pericardium: There is no evidence of pericardial effusion. Mitral Valve: The mitral valve is normal in structure. No evidence of mitral valve regurgitation. No evidence of mitral valve stenosis. Tricuspid Valve: The tricuspid valve is normal in structure. Tricuspid valve regurgitation is not demonstrated. No evidence of tricuspid stenosis. Aortic Valve: The aortic valve is normal in structure. Aortic valve regurgitation is not visualized. No aortic stenosis is present. Pulmonic Valve: The pulmonic valve was normal in structure. Pulmonic valve regurgitation is not visualized. No evidence of pulmonic stenosis. Aorta: The aortic root is normal in size and structure. Venous: The inferior vena cava is normal in size with greater than 50% respiratory variability, suggesting right atrial pressure of 3 mmHg. IAS/Shunts: No  atrial level shunt detected by color flow Doppler.  LEFT VENTRICLE PLAX 2D LVIDd:         4.70 cm   Diastology LVIDs:         2.60 cm   LV e' medial:    11.20 cm/s LV PW:         0.90 cm   LV E/e' medial:  5.7 LV IVS:        0.90 cm   LV e' lateral:   13.10 cm/s LVOT diam:     2.10 cm   LV E/e' lateral: 4.9 LV SV:         73 LV SV Index:   39 LVOT Area:     3.46 cm  RIGHT VENTRICLE             IVC RV Basal diam:  2.50 cm     IVC diam: 1.40 cm RV S prime:     17.60 cm/s TAPSE (M-mode): 2.7 cm LEFT ATRIUM             Index        RIGHT ATRIUM           Index LA diam:        3.60 cm 1.91 cm/m   RA Area:     16.20 cm LA Vol (A2C):   42.0 ml 22.28 ml/m  RA Volume:   41.10 ml  21.81 ml/m LA Vol (A4C):   43.5 ml 23.08 ml/m LA Biplane Vol: 44.5 ml 23.61 ml/m  AORTIC VALVE LVOT Vmax:   111.00 cm/s LVOT Vmean:  72.800 cm/s LVOT VTI:    0.210 m  AORTA Ao Root diam: 3.70 cm Ao Asc diam:  3.60 cm MITRAL VALVE MV Area (PHT): 4.39 cm    SHUNTS MV Decel Time: 173 msec    Systemic VTI:  0.21 m MV E velocity: 64.30 cm/s  Systemic Diam: 2.10 cm MV A velocity: 63.00 cm/s MV E/A ratio:  1.02 Mihai Croitoru MD Electronically signed by Thurmon Fair MD Signature Date/Time: 06/15/2023/5:08:37 PM    Final     Anti-infectives: Anti-infectives (From admission, onward)    None  Assessment/Plan 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 - PET scan w/ "suspected direct tumor extension into the pancreatic body" and LN invasion as above, no bony metastasis noted, no metstatic disease in the neck, chest, or pelvis.  - await updated oncology recs - given poor oral intake as above I do think he will need a surgical feeding tube this admission. Will discuss with Dr. Freida Busman  and oncology. At the patients request we will also plan to discuss his surgical plan of care with Karleen Hampshire.    LOS: 3 days   I reviewed nursing notes, Consultant oncology notes, hospitalist notes, last 24 h vitals and pain scores, last 48 h intake and output, last 24 h labs and trends, and last 24 h imaging results.  This care required high  level of medical decision making.   Jesus Spangle, Jesus Haynes [facility] Please see Amion for pager number during day hours 7:00am-4:30pm

## 2023-06-17 NOTE — Progress Notes (Addendum)
[facility]  PROGRESS NOTE  Jesus Haynes LKG:[id number] DOB: 02/10/1976 DOA: 06/14/2023 PCP: Ivonne Andrew, NP   Brief HPI:   47 y.o. male with medical history significant of asthma, adenocarcinoma of the gastroesophageal junction, cancer associated pain, history of GI bleed, anemia due to chronic blood loss was sent from the cancer center after he had a near syncopal episode while they were drawing multiple vials of blood.  Patient was then sent to the hospital for further evaluation and treatment.      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 formulation once feeding jejunostomy tube in place -Plan for jejunostomy tube placement in a.m.     Pre-syncope Received IV fluids.  2D echocardiogram with LV ejection fraction of 60 to 65% will monitor closely...     Anemia due to chronic GI blood loss Hemoglobin on 06/13/2021 at 8.4 from 9.3.  Will transfuse for hemoglobin less than 7.  No evidence of bleeding noted.     Asthma Continue albuterol MDI as needed.  Appears compensated at this time    Medications     feeding supplement  237 mL Oral BID BM     Data Reviewed:   CBG:  No results for input(s): "GLUCAP" in the last 168 hours.  SpO2: 99 %    Vitals:   06/16/23 1426 06/16/23 1936 06/16/23 1936 06/17/23 0627  BP: (!) 152/97 (!) 140/93 (!) 140/93 127/86  Pulse: 99 89 87 79  Resp: 18 18 18 18   Temp: 98.4 F (36.9 C) 98.8 F (37.1 C) 98.8 F (37.1 C) 98.2 F (36.8 C)  TempSrc: Oral Oral Oral Oral  SpO2: 98% 97% 98% 99%  Weight:      Height:          Data Reviewed:  Basic Metabolic Panel: Recent Labs  Lab 06/12/23 1413 06/14/23 2005 06/15/23 0731 06/16/23 0931 06/17/23 0904  NA 139 134* 136 134* 136  K 5.0 3.9 3.8 3.9 3.5  CL 101 104 107 105 105  CO2 16* 21* 24 22 20*  GLUCOSE 109* 100* 106* 96 100*   BUN 14 15 14 11 11   CREATININE 0.80 0.85 0.72 0.58* 0.58*  CALCIUM 9.6 8.7* 8.5* 8.1* 8.3*  MG  --   --   --  1.7 1.7    CBC: Recent Labs  Lab 06/12/23 1413 06/14/23 2005 06/15/23 0731 06/16/23 0931 06/17/23 0904  WBC 9.6 9.0 6.7 6.6 6.9  HGB 10.5* 9.3* 8.4* 8.4* 8.3*  HCT 34.2* 30.4* 27.8* 28.1* 26.3*  MCV 82 81.7 82.7 81.9 80.7  PLT 543* 430* 345 293 295    LFT Recent Labs  Lab 06/12/23 1413 06/14/23 2005 06/15/23 0731  AST 18 17 16   ALT 19 21 20   ALKPHOS 69 49 47  BILITOT 0.4 0.7 0.6  PROT 7.0 6.7 6.0*  ALBUMIN 3.9* 3.5 3.1*     Antibiotics: Anti-infectives (From admission, onward)    None        DVT prophylaxis: SCDs  Code Status: Full code  Family Communication: Discussed with patient's mother at bedside   CONSULTS    Subjective   Pain well-controlled after getting IV Dilaudid   Objective    Physical Examination:   General-appears in no acute distress Heart-S1-S2, regular, no murmur auscultated Lungs-clear to auscultation bilaterally, no wheezing or crackles auscultated Abdomen-soft, nontender, no organomegaly Extremities-no edema in the lower  extremities Neuro-alert, oriented x3, no focal deficit noted   Status is: Inpatient:             Meredeth Ide   [facility] If 7PM-7AM, please contact night-coverage at [email or web address], Office  [phone]   06/17/2023, 11:18 AM  LOS: 3 days

## 2023-06-17 NOTE — Plan of Care (Signed)
  Problem: Education: Goal: Knowledge of General Education information will improve Description: Including pain rating scale, medication(s)/side effects and non-pharmacologic comfort measures Outcome: Progressing   Problem: Health Behavior/Discharge Planning: Goal: Ability to manage health-related needs will improve Outcome: Not Progressing   Problem: Clinical Measurements: Goal: Ability to maintain clinical measurements within normal limits will improve Outcome: Progressing Goal: Will remain free from infection Outcome: Progressing Goal: Diagnostic test results will improve Outcome: Progressing Goal: Respiratory complications will improve Outcome: Progressing Goal: Cardiovascular complication will be avoided Outcome: Progressing   Problem: Activity: Goal: Risk for activity intolerance will decrease Outcome: Progressing   Problem: Nutrition: Goal: Adequate nutrition will be maintained Outcome: Progressing   Problem: Coping: Goal: Level of anxiety will decrease Outcome: Progressing   Problem: Elimination: Goal: Will not experience complications related to bowel motility Outcome: Progressing Goal: Will not experience complications related to urinary retention Outcome: Progressing   Problem: Pain Management: Goal: General experience of comfort will improve Outcome: Progressing   Problem: Safety: Goal: Ability to remain free from injury will improve Outcome: Progressing   Problem: Skin Integrity: Goal: Risk for impaired skin integrity will decrease Outcome: Progressing

## 2023-06-18 ENCOUNTER — Inpatient Hospital Stay (HOSPITAL_COMMUNITY): Payer: Medicaid Other | Admitting: Anesthesiology

## 2023-06-18 ENCOUNTER — Encounter (HOSPITAL_COMMUNITY): Payer: Self-pay | Admitting: *Deleted

## 2023-06-18 ENCOUNTER — Other Ambulatory Visit: Payer: Self-pay

## 2023-06-18 ENCOUNTER — Encounter (HOSPITAL_COMMUNITY)
Admission: AD | Disposition: A | Discharge status: Home / Self Care | Payer: Self-pay | Source: Ambulatory Visit | Attending: Family Medicine

## 2023-06-18 DIAGNOSIS — G893 Neoplasm related pain (acute) (chronic): Secondary | ICD-10-CM | POA: Diagnosis not present

## 2023-06-18 DIAGNOSIS — Z7189 Other specified counseling: Secondary | ICD-10-CM | POA: Diagnosis not present

## 2023-06-18 DIAGNOSIS — C160 Malignant neoplasm of cardia: Secondary | ICD-10-CM

## 2023-06-18 DIAGNOSIS — D500 Iron deficiency anemia secondary to blood loss (chronic): Secondary | ICD-10-CM | POA: Diagnosis not present

## 2023-06-18 DIAGNOSIS — R627 Adult failure to thrive: Secondary | ICD-10-CM | POA: Diagnosis not present

## 2023-06-18 DIAGNOSIS — E440 Moderate protein-calorie malnutrition: Secondary | ICD-10-CM | POA: Insufficient documentation

## 2023-06-18 DIAGNOSIS — Z515 Encounter for palliative care: Secondary | ICD-10-CM | POA: Diagnosis not present

## 2023-06-18 HISTORY — PX: UMBILICAL HERNIA REPAIR: SHX196

## 2023-06-18 LAB — CBC
HCT: 27.6 % — ABNORMAL LOW (ref 39.0–52.0)
Hemoglobin: 8.4 g/dL — ABNORMAL LOW (ref 13.0–17.0)
MCH: 24.9 pg — ABNORMAL LOW (ref 26.0–34.0)
MCHC: 30.4 g/dL (ref 30.0–36.0)
MCV: 81.7 fL (ref 80.0–100.0)
Platelets: 288 10*3/uL (ref 150–400)
RBC: 3.38 MIL/uL — ABNORMAL LOW (ref 4.22–5.81)
RDW: 13.9 % (ref 11.5–15.5)
WBC: 7 10*3/uL (ref 4.0–10.5)
nRBC: 0 % (ref 0.0–0.2)

## 2023-06-18 LAB — COMPREHENSIVE METABOLIC PANEL
ALT: 19 U/L (ref 0–44)
AST: 17 U/L (ref 15–41)
Albumin: 3 g/dL — ABNORMAL LOW (ref 3.5–5.0)
Alkaline Phosphatase: 49 U/L (ref 38–126)
Anion gap: 10 (ref 5–15)
BUN: 11 mg/dL (ref 6–20)
CO2: 20 mmol/L — ABNORMAL LOW (ref 22–32)
Calcium: 8.6 mg/dL — ABNORMAL LOW (ref 8.9–10.3)
Chloride: 106 mmol/L (ref 98–111)
Creatinine, Ser: 0.63 mg/dL (ref 0.61–1.24)
GFR, Estimated: >60 mL/min (ref >60)
Glucose, Bld: 98 mg/dL (ref 70–99)
Potassium: 3.7 mmol/L (ref 3.5–5.1)
Sodium: 136 mmol/L (ref 135–145)
Total Bilirubin: 0.9 mg/dL (ref <1.2)
Total Protein: 6.1 g/dL — ABNORMAL LOW (ref 6.5–8.1)

## 2023-06-18 SURGERY — REPAIR, HERNIA, UMBILICAL, ADULT
Anesthesia: General

## 2023-06-18 MED ORDER — ACETAMINOPHEN 650 MG RE SUPP: 650.0000 mg | Freq: Four times a day (QID) | RECTAL | Status: DC

## 2023-06-18 MED ORDER — SUGAMMADEX SODIUM 200 MG/2ML IV SOLN
INTRAVENOUS | Status: DC | PRN
  Administered 2023-06-18: 200 mg via INTRAVENOUS

## 2023-06-18 MED ORDER — ACETAMINOPHEN 10 MG/ML IV SOLN
INTRAVENOUS | Status: AC
  Filled 2023-06-18: qty 100

## 2023-06-18 MED ORDER — ACETAMINOPHEN 10 MG/ML IV SOLN
INTRAVENOUS | Status: DC | PRN
  Administered 2023-06-18: 1000 mg via INTRAVENOUS

## 2023-06-18 MED ORDER — ROCURONIUM BROMIDE 10 MG/ML (PF) SYRINGE
PREFILLED_SYRINGE | INTRAVENOUS | Status: AC
  Filled 2023-06-18: qty 10

## 2023-06-18 MED ORDER — ONDANSETRON HCL 4 MG/2ML IJ SOLN
INTRAMUSCULAR | Status: AC
  Filled 2023-06-18: qty 2

## 2023-06-18 MED ORDER — FENTANYL CITRATE PF 50 MCG/ML IJ SOSY: 25.0000 ug | PREFILLED_SYRINGE | INTRAMUSCULAR | Status: DC | PRN

## 2023-06-18 MED ORDER — PROPOFOL 10 MG/ML IV BOLUS
INTRAVENOUS | Status: AC
  Filled 2023-06-18: qty 20

## 2023-06-18 MED ORDER — BUPIVACAINE-EPINEPHRINE 0.25% -1:200000 IJ SOLN
INTRAMUSCULAR | Status: AC
  Filled 2023-06-18: qty 1

## 2023-06-18 MED ORDER — FENTANYL CITRATE (PF) 100 MCG/2ML IJ SOLN
INTRAMUSCULAR | Status: AC
  Filled 2023-06-18: qty 2

## 2023-06-18 MED ORDER — ENSURE ENLIVE PO LIQD
237.0000 mL | Freq: Three times a day (TID) | ORAL | Status: DC
Start: 2023-06-18 — End: 2023-06-21

## 2023-06-18 MED ORDER — CEFAZOLIN SODIUM-DEXTROSE 2-4 GM/100ML-% IV SOLN
2.0000 g | INTRAVENOUS | Status: AC
  Administered 2023-06-18: 2 g via INTRAVENOUS
  Filled 2023-06-18: qty 100

## 2023-06-18 MED ORDER — ALBUTEROL SULFATE HFA 108 (90 BASE) MCG/ACT IN AERS
INHALATION_SPRAY | RESPIRATORY_TRACT | Status: AC
  Filled 2023-06-18: qty 6.7

## 2023-06-18 MED ORDER — ONDANSETRON HCL 4 MG/2ML IJ SOLN
INTRAMUSCULAR | Status: DC | PRN
  Administered 2023-06-18: 4 mg via INTRAVENOUS

## 2023-06-18 MED ORDER — ALBUTEROL SULFATE HFA 108 (90 BASE) MCG/ACT IN AERS
INHALATION_SPRAY | RESPIRATORY_TRACT | Status: DC | PRN
  Administered 2023-06-18: 4 via RESPIRATORY_TRACT

## 2023-06-18 MED ORDER — METHOCARBAMOL 500 MG PO TABS: 500.0000 mg | ORAL_TABLET | Freq: Four times a day (QID) | ORAL | Status: DC

## 2023-06-18 MED ORDER — MIDAZOLAM HCL 2 MG/2ML IJ SOLN
INTRAMUSCULAR | Status: AC
  Filled 2023-06-18: qty 2

## 2023-06-18 MED ORDER — MIDAZOLAM HCL 5 MG/5ML IJ SOLN
INTRAMUSCULAR | Status: DC | PRN
  Administered 2023-06-18: 2 mg via INTRAVENOUS

## 2023-06-18 MED ORDER — LACTATED RINGERS IV SOLN: INTRAVENOUS | Status: DC

## 2023-06-18 MED ORDER — HYDROMORPHONE HCL 1 MG/ML IJ SOLN
1.0000 mg | Freq: Once | INTRAMUSCULAR | Status: AC
  Administered 2023-06-18: 1 mg via INTRAVENOUS
  Filled 2023-06-18: qty 1

## 2023-06-18 MED ORDER — BUPIVACAINE-EPINEPHRINE 0.25% -1:200000 IJ SOLN
INTRAMUSCULAR | Status: DC | PRN
  Administered 2023-06-18: 30 mL

## 2023-06-18 MED ORDER — LIDOCAINE HCL (PF) 2 % IJ SOLN
INTRAMUSCULAR | Status: AC
  Filled 2023-06-18: qty 5

## 2023-06-18 MED ORDER — FENTANYL CITRATE (PF) 100 MCG/2ML IJ SOLN
INTRAMUSCULAR | Status: DC | PRN
  Administered 2023-06-18 (×4): 50 ug via INTRAVENOUS

## 2023-06-18 MED ORDER — HYDROMORPHONE HCL 1 MG/ML IJ SOLN
1.0000 mg | INTRAMUSCULAR | Status: DC | PRN
  Administered 2023-06-18 – 2023-06-21 (×25): 1 mg via INTRAVENOUS
  Filled 2023-06-18 (×25): qty 1

## 2023-06-18 MED ORDER — DEXAMETHASONE SODIUM PHOSPHATE 10 MG/ML IJ SOLN
INTRAMUSCULAR | Status: AC
  Filled 2023-06-18: qty 1

## 2023-06-18 MED ORDER — SODIUM CHLORIDE 0.9 % IV SOLN: 12.5000 mg | INTRAVENOUS | Status: DC | PRN

## 2023-06-18 MED ORDER — IPRATROPIUM-ALBUTEROL 0.5-2.5 (3) MG/3ML IN SOLN
3.0000 mL | Freq: Once | RESPIRATORY_TRACT | Status: AC
  Administered 2023-06-18: 3 mL via RESPIRATORY_TRACT
  Filled 2023-06-18: qty 3

## 2023-06-18 MED ORDER — DEXAMETHASONE SODIUM PHOSPHATE 10 MG/ML IJ SOLN
INTRAMUSCULAR | Status: DC | PRN
  Administered 2023-06-18: 5 mg via INTRAVENOUS

## 2023-06-18 MED ORDER — ROCURONIUM BROMIDE 100 MG/10ML IV SOLN
INTRAVENOUS | Status: DC | PRN
  Administered 2023-06-18: 50 mg via INTRAVENOUS

## 2023-06-18 MED ORDER — AMISULPRIDE (ANTIEMETIC) 5 MG/2ML IV SOLN: 10.0000 mg | Freq: Once | INTRAVENOUS | Status: DC | PRN

## 2023-06-18 MED ORDER — ACETAMINOPHEN 325 MG PO TABS: 650.0000 mg | ORAL_TABLET | Freq: Four times a day (QID) | ORAL | Status: DC

## 2023-06-18 MED ORDER — LIDOCAINE HCL (CARDIAC) PF 100 MG/5ML IV SOSY
PREFILLED_SYRINGE | INTRAVENOUS | Status: DC | PRN
  Administered 2023-06-18: 60 mg via INTRAVENOUS

## 2023-06-18 MED ORDER — CHLORHEXIDINE GLUCONATE 0.12 % MT SOLN
15.0000 mL | Freq: Once | OROMUCOSAL | Status: AC
  Administered 2023-06-18: 15 mL via OROMUCOSAL

## 2023-06-18 MED ORDER — HYDROMORPHONE HCL 1 MG/ML PO LIQD: 2.5000 mg | ORAL | Status: DC | PRN

## 2023-06-18 MED ORDER — PROPOFOL 10 MG/ML IV BOLUS
INTRAVENOUS | Status: DC | PRN
  Administered 2023-06-18: 200 mg via INTRAVENOUS

## 2023-06-18 MED ORDER — CHLORHEXIDINE GLUCONATE CLOTH 2 % EX PADS: 6.0000 | MEDICATED_PAD | Freq: Once | CUTANEOUS | Status: DC

## 2023-06-18 MED ORDER — SUCCINYLCHOLINE CHLORIDE 200 MG/10ML IV SOSY
PREFILLED_SYRINGE | INTRAVENOUS | Status: DC | PRN
  Administered 2023-06-18: 100 mg via INTRAVENOUS

## 2023-06-18 SURGICAL SUPPLY — 32 items
BAG COUNTER SPONGE SURGICOUNT (BAG) IMPLANT
BINDER ABDOMINAL 12 ML 46-62 (SOFTGOODS) ×2 IMPLANT
CHLORAPREP W/TINT 26 (MISCELLANEOUS) ×2 IMPLANT
COVER SURGICAL LIGHT HANDLE (MISCELLANEOUS) ×2 IMPLANT
DERMABOND ADVANCED .7 DNX12 (GAUZE/BANDAGES/DRESSINGS) ×2 IMPLANT
DERMABOND ADVANCED .7 DNX6 (GAUZE/BANDAGES/DRESSINGS) IMPLANT
DRAIN CHANNEL 19F RND (DRAIN) IMPLANT
DRAPE LAPAROSCOPIC ABDOMINAL (DRAPES) ×2 IMPLANT
ELECT REM PT RETURN 15FT ADLT (MISCELLANEOUS) ×2 IMPLANT
EVACUATOR SILICONE 100CC (DRAIN) IMPLANT
GAUZE SPONGE 4X4 12PLY STRL (GAUZE/BANDAGES/DRESSINGS) IMPLANT
GLOVE BIOGEL PI IND STRL 6 (GLOVE) ×2 IMPLANT
GLOVE BIOGEL PI MICRO STRL 5.5 (GLOVE) ×2 IMPLANT
GLOVE SURG POLYISO LF SZ6 (GLOVE) ×2 IMPLANT
GOWN STRL REUS W/ TWL LRG LVL3 (GOWN DISPOSABLE) ×2 IMPLANT
J-TUBE MIC 16FX51 UNV ENFIT (TUBING) IMPLANT
KIT BASIN OR (CUSTOM PROCEDURE TRAY) ×2 IMPLANT
KIT TURNOVER KIT A (KITS) IMPLANT
NDL HYPO 22X1.5 SAFETY MO (MISCELLANEOUS) IMPLANT
NEEDLE HYPO 22X1.5 SAFETY MO (MISCELLANEOUS) IMPLANT
PACK GENERAL/GYN (CUSTOM PROCEDURE TRAY) ×2 IMPLANT
SPIKE FLUID TRANSFER (MISCELLANEOUS) ×2 IMPLANT
SPONGE DRAIN TRACH 4X4 STRL 2S (GAUZE/BANDAGES/DRESSINGS) IMPLANT
SUT ETHILON 2 0 PS N (SUTURE) IMPLANT
SUT MNCRL AB 4-0 PS2 18 (SUTURE) ×2 IMPLANT
SUT NOVA NAB DX-16 0-1 5-0 T12 (SUTURE) ×2 IMPLANT
SUT PDS AB 1 CT1 27 (SUTURE) IMPLANT
SUT VIC AB 3-0 SH 27X BRD (SUTURE) ×2 IMPLANT
SYR CONTROL 10ML LL (SYRINGE) IMPLANT
TOWEL OR 17X26 10 PK STRL BLUE (TOWEL DISPOSABLE) ×2 IMPLANT
TOWEL OR NON WOVEN STRL DISP B (DISPOSABLE) ×2 IMPLANT
TUBE JEJUNAL 16FR ENFIT (TUBING) ×1 IMPLANT

## 2023-06-18 NOTE — Op Note (Signed)
Date: 06/18/23  Patient: Jesus Haynes MRN: [id number]  Preoperative Diagnosis: GE junction cancer, failure to thrive Postoperative Diagnosis: Same  Procedure: Open placement of feeding jejunostomy tube  Surgeon: Sophronia Simas, MD  EBL: Minimal  Anesthesia: General endotracheal  Specimens: None  Indications: Jesus Haynes is a 47 yo male with a locally advanced adenocarcinoma of the GE junction. He was admitted from clinic with dizziness and failure to thrive, and has had poor PO intake secondary to abdominal pain and dysphagia. Feeding tube placement was requested to supplement his nutrition. After a discussion of the risks and benefits of surgery, he agreed to proceed with placement of a feeding jejunostomy tube.  Findings: 16-Fr Witzel jejunostomy tube placed in the proximal jejunum, at 4.5cm at the skin. Balloon was inflated with 1.55mL sterile water.  Procedure details: Informed consent was obtained in the preoperative area prior to the procedure. The patient was brought to the operating room and placed on the table in the supine position. General anesthesia was induced and appropriate lines and drains were placed for intraoperative monitoring. Perioperative antibiotics were administered per SCIP guidelines. The abdomen was prepped and draped in the usual sterile fashion. A pre-procedure timeout was taken verifying patient identity, surgical site and procedure to be performed.  An upper midline mini-laparotomy incision was made, and the subcutaneous tissue was divided with cautery to expose the fascia.  The fascia was elevated and opened along the linea alba, and the peritoneum was opened with cautery.  The peritoneum surrounding the incision was palpated and there were no peritoneal nodules. A loop of small bowel was identified and extracted through the incision and followed proximally to the ligament of Treitz.  A point on the jejunum was selected approximately 20 cm distal to the ligament  of Treitz for placement of a J-tube.  A 3-0 silk pursestring suture was placed at this point on the antimesenteric border of the bowel, and an enterotomy was created at the center of the pursestring.  A 16 French jejunostomy tube was brought onto the field and passed through the abdominal wall in the left upper quadrant.  The tube was cut to an appropriate length, and fed into the enterotomy in the jejunum and milked distally.  The pursestring suture was then tied down to secure the tube in place, and the balloon was inflated with 1.5 mL sterile water to keep the tube in place.  A Witzel tunnel was then created over the feeding tube with 3-0 silk Lembert sutures.  The bumper was then cinched down to the abdominal wall.  The tube was at approximately 4.5 cm at the skin.  The jejunum was then pexied to the peritoneum around the feeding tube using 3-0 silk sutures.  The jejunum was tacked to the abdominal wall distally using a 3-0 silk suture to prevent torsion of the small bowel around the feeding tube.  The surgical site appeared hemostatic.  The fascia was closed using a running 1 PDS suture.  Scarpa's layer was closed with a running 3-0 Vicryl suture, and the skin was closed with running subcuticular 4-0 Monocryl suture.  Dermabond was applied.  The bumper of the feeding tube was secured to the skin with 2-0 nylon suture.  The patient tolerated the procedure well with no apparent complications.  All counts were correct x2 at the end of the procedure. The patient was extubated and taken to PACU in stable condition.  Sophronia Simas, MD 06/18/23 4:43 PM

## 2023-06-18 NOTE — H&P (View-Only) (Signed)
[facility] Progress Note     Subjective: Still has some gagging and difficulty swallowing especially with pain. Seen by palliative care yesterday.   Objective: Vital signs in last 24 hours: Temp:  [97.9 F (36.6 C)-98.5 F (36.9 C)] 97.9 F (36.6 C) (12/17 0435) Pulse Rate:  [82-84] 84 (12/17 0435) Resp:  [14] 14 (12/17 0435) BP: (123-151)/(78-91) 124/78 (12/17 0435) SpO2:  [98 %-99 %] 98 % (12/17 0435) Last BM Date : 06/07/23  Intake/Output from previous day: 12/16 0701 - 12/17 0700 In: 474 [P.O.:474] Out: 600 [Urine:600] Intake/Output this shift: No intake/output data recorded.  PE: Gen:  Alert, NAD, dry heaving  Pulm:  Normal work of breathing on room air Abd: Soft, nondistended, nontender to palpation. Skin: warm and dry, no rashes  Psych: A&Ox3   Lab Results:  Recent Labs    06/17/23 0904 06/18/23 0606  WBC 6.9 7.0  HGB 8.3* 8.4*  HCT 26.3* 27.6*  PLT 295 288   BMET Recent Labs    06/17/23 0904 06/18/23 0606  NA 136 136  K 3.5 3.7  CL 105 106  CO2 20* 20*  GLUCOSE 100* 98  BUN 11 11  CREATININE 0.58* 0.63  CALCIUM 8.3* 8.6*   PT/INR No results for input(s): "LABPROT", "INR" in the last 72 hours. CMP     Component Value Date/Time   NA 136 06/18/2023 0606   NA 139 06/12/2023 1413   K 3.7 06/18/2023 0606   CL 106 06/18/2023 0606   CO2 20 (L) 06/18/2023 0606   GLUCOSE 98 06/18/2023 0606   BUN 11 06/18/2023 0606   BUN 14 06/12/2023 1413   CREATININE 0.63 06/18/2023 0606   CALCIUM 8.6 (L) 06/18/2023 0606   PROT 6.1 (L) 06/18/2023 0606   PROT 7.0 06/12/2023 1413   ALBUMIN 3.0 (L) 06/18/2023 0606   ALBUMIN 3.9 (L) 06/12/2023 1413   AST 17 06/18/2023 0606   ALT 19 06/18/2023 0606   ALKPHOS 49 06/18/2023 0606   BILITOT 0.9 06/18/2023 0606   BILITOT 0.4 06/12/2023 1413   GFRNONAA >60 06/18/2023 0606   GFRAA >60 08/24/2018 0750   Lipase     Component Value Date/Time   LIPASE 142 (H) 05/27/2023 1102        Studies/Results: VAS US CAROTID Result Date: 06/17/2023 Carotid Arterial Duplex Study Patient Name:  CANIO CONTRERAZ  Date of Exam:   06/16/2023 Medical Rec #: [id number]       Accession #:    [id number] Date of Birth: 1976/05/08      Patient Gender: M Patient Age:   66 years Exam Location:  [facility] Procedure:      VAS US CAROTID Referring Phys: DAVID ORTIZ --------------------------------------------------------------------------------  Indications:       Syncope. Comparison Study:  No prior exam. Performing Technologist: Fernande Bras  Examination Guidelines: A complete evaluation includes B-mode imaging, spectral Doppler, color Doppler, and power Doppler as needed of all accessible portions of each vessel. Bilateral testing is considered an integral part of a complete examination. Limited examinations for reoccurring indications may be performed as noted.  Right Carotid Findings: +----------+--------+--------+--------+------------------+--------+           PSV cm/sEDV cm/sStenosisPlaque DescriptionComments +----------+--------+--------+--------+------------------+--------+ CCA Prox  125     26                                         +----------+--------+--------+--------+------------------+--------+  CCA Distal101     25                                         +----------+--------+--------+--------+------------------+--------+ ICA Prox  71      24                                tortuous +----------+--------+--------+--------+------------------+--------+ ICA Mid   75      27                                tortuous +----------+--------+--------+--------+------------------+--------+ ICA Distal63      27                                tortuous +----------+--------+--------+--------+------------------+--------+ ECA       81      13                                         +----------+--------+--------+--------+------------------+--------+  +----------+--------+-------+------------+-------------------+           PSV cm/sEDV cmsDescribe    Arm Pressure (mmHG) +----------+--------+-------+------------+-------------------+ Subclavian               Not assessed                    +----------+--------+-------+------------+-------------------+ +---------+--------+--+--------+--+ VertebralPSV cm/s74EDV cm/s24 +---------+--------+--+--------+--+ Recent port placement, right subclavian artery not visualized due to bandages. Left Carotid Findings: +----------+--------+--------+--------+------------------+--------+           PSV cm/sEDV cm/sStenosisPlaque DescriptionComments +----------+--------+--------+--------+------------------+--------+ CCA Prox  141     24                                         +----------+--------+--------+--------+------------------+--------+ CCA Distal95      21                                         +----------+--------+--------+--------+------------------+--------+ ICA Prox  117     39                                         +----------+--------+--------+--------+------------------+--------+ ICA Mid   102     30                                         +----------+--------+--------+--------+------------------+--------+ ICA Distal95      38                                         +----------+--------+--------+--------+------------------+--------+ ECA       97      13                                         +----------+--------+--------+--------+------------------+--------+ +----------+--------+--------+--------+-------------------+  PSV cm/sEDV cm/sDescribeArm Pressure (mmHG) +----------+--------+--------+--------+-------------------+ [id number]                                         +----------+--------+--------+--------+-------------------+ +---------+--------+--+--------+--+ VertebralPSV cm/s71EDV cm/s28  +---------+--------+--+--------+--+   Summary: Right Carotid: The ECA appears <50% stenosed. The extracranial vessels were                near-normal with only minimal wall thickening or plaque. Left Carotid: Velocities in the left ICA are consistent with a 1-39% stenosis.               The ECA appears <50% stenosed. Vertebrals:  Bilateral vertebral arteries demonstrate antegrade flow. Subclavians: Right subclavian artery was not visualized. Normal flow              hemodynamics were seen in the left subclavian artery. *See table(s) above for measurements and observations.  Electronically signed by Heath Lark on 06/17/2023 at 7:33:58 AM.    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 - PET scan w/ "suspected direct tumor extension into the pancreatic body" and LN invasion as above, no bony metastasis noted, no metstatic disease in the neck, chest, or pelvis.  - Patient would benefit from a feeding J tube, however he would need a home feeding pump and does not currently have insurance coverage. Will discuss with TOC regarding his options to obtain a feeding pump and tube feeds. May need to defer surgery until this is sorted out. I discussed this with the patient this morning.   LOS: 4 days   I reviewed nursing notes, Consultant oncology notes, hospitalist notes, last 24 h vitals and pain scores, last 48 h intake and output, last 24 h labs and trends, and last 24 h imaging results.  This care required straight-forward level of medical decision making.   Sophronia Simas, MD [facility] General, Hepatobiliary and Pancreatic  Surgery 06/18/23 8:28 AM

## 2023-06-18 NOTE — Anesthesia Procedure Notes (Signed)
Procedure Name: Intubation Date/Time: 06/18/2023 3:49 PM  Performed by: Chinita Pester, CRNAPre-anesthesia Checklist: Patient identified, Emergency Drugs available, Suction available and Patient being monitored Patient Re-evaluated:Patient Re-evaluated prior to induction Oxygen Delivery Method: Circle System Utilized Preoxygenation: Pre-oxygenation with 100% oxygen Induction Type: IV induction and Rapid sequence Laryngoscope Size: Glidescope and 3 Grade View: Grade I Tube type: Oral Tube size: 7.5 mm Number of attempts: 1 Airway Equipment and Method: Stylet, Oral airway and Video-laryngoscopy Placement Confirmation: ETT inserted through vocal cords under direct vision, positive ETCO2 and breath sounds checked- equal and bilateral Secured at: 21 cm Tube secured with: tube ties. Dental Injury: Teeth and Oropharynx as per pre-operative assessment  Difficulty Due To: Difficulty was anticipated, Difficult Airway- due to anterior larynx and Difficult Airway- due to limited oral opening

## 2023-06-18 NOTE — Assessment & Plan Note (Signed)
-  Please review HPI/oncology history for additional details and timeline of events.  -Patient was seen by me during recent hospitalization. When he presented to our clinic to establish care with me on 06/14/23, he was noted to be unable to eat for the last several days and has been feeling dizzy, lightheaded.  Had significant weight loss.  Despite IV hydration in clinic, he continued to remain dizzy and hence we admitted him to the hospital for further evaluation and management.  -Reviewed staging PET/CT findings with the patient today in greater detail.  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  Discussed treatment options. All treatment options are palliative in nature and not curative intent. Plan is to proceed with palliative systemic treatment using FOLFOX plus nivolumab, pending resolution of acute issues.  -I will also discuss with our radiation oncology colleagues to see if he would benefit from some palliative radiation to improve his swallowing.

## 2023-06-18 NOTE — Anesthesia Preprocedure Evaluation (Addendum)
Anesthesia Evaluation  Patient identified by MRN, date of birth, ID band Patient awake    Reviewed: Allergy & Precautions, NPO status , Patient's Chart, lab work & pertinent test results  History of Anesthesia Complications Negative for: history of anesthetic complications  Airway Mallampati: III  TM Distance: >3 FB Neck ROM: Full    Dental  (+) Dental Advisory Given, Chipped, Missing   Pulmonary asthma , former smoker   Pulmonary exam normal        Cardiovascular negative cardio ROS Normal cardiovascular exam     Neuro/Psych negative neurological ROS  negative psych ROS   GI/Hepatic Neg liver ROS,GERD  Medicated and Controlled,, Gastric cancer    Endo/Other  negative endocrine ROS    Renal/GU negative Renal ROS     Musculoskeletal negative musculoskeletal ROS (+)    Abdominal   Peds  Hematology  (+) Blood dyscrasia, anemia   Anesthesia Other Findings   Reproductive/Obstetrics                             Anesthesia Physical Anesthesia Plan  ASA: 3  Anesthesia Plan: General   Post-op Pain Management: Ofirmev IV (intra-op)*   Induction: Intravenous and Rapid sequence  PONV Risk Score and Plan: 2 and Treatment may vary due to age or medical condition, Ondansetron, Dexamethasone and Midazolam  Airway Management Planned: Oral ETT and Video Laryngoscope Planned  Additional Equipment: None  Intra-op Plan:   Post-operative Plan: Extubation in OR  Informed Consent: I have reviewed the patients History and Physical, chart, labs and discussed the procedure including the risks, benefits and alternatives for the proposed anesthesia with the patient or authorized representative who has indicated his/her understanding and acceptance.     Dental advisory given  Plan Discussed with: CRNA and Anesthesiologist  Anesthesia Plan Comments:         Anesthesia Quick Evaluation

## 2023-06-18 NOTE — Assessment & Plan Note (Signed)
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  - Surgery department placed J-tube today to assist with his nutrition.

## 2023-06-18 NOTE — Progress Notes (Signed)
1952 pt educated on pain regimen, pt is refusing oral mediations and only wants IV. Pt is able to sip and swallow but stated he is hurting too bad to take oral medications.   2022 on call Jesus Haynes was notified pt was educated on pain regimen and that family were setting alarms for pt as reminder. Pt's and family's expectation is that PRN medications are administered on the 2 hour mark. Pt and family were education that writer may not always be ale to be there at the 2 hour mark but will try to keep time as closely as possible.   2209 next dose of PRN diluadid was administered. Pt was upset that writer did not arrive closer to the time he called. Writer was with other patients when pt called and medication was administered when available. Pt's daughter at bedside was walking throughout hall way cursing due to the delay. Other family members called the unit.    2217 AC was notified and informed of above events. AC spoke to pt and family member at bedside.

## 2023-06-18 NOTE — TOC Progression Note (Addendum)
Transition of Care [facility]) - Progression Note    Patient Details  Name: Jesus Haynes MRN: [id number] Date of Birth: 07-01-1976  [facility] CM/SW Contact  Beckie Busing, RN Phone Number:[phone]  06/18/2023, 10:22 AM  Clinical Narrative:    TOC received message from Sophronia Simas MD in reference to placing J tube for uninsured patient. CM has reached out to patient assistance but they are unable to assist with tube feeding supplies. There is notation that states that "No PAC REQ'D for pending MCD." CM does not know what this means for patient and the medicaid application process. Message has been sent to Adora Fridge for clarification. CM has spoken to [facility] supervisor Sharol Roussel who confirms that if patients medicaid is pending the hospital can offer a 30 day Letter of guarantee to pay for tube feeding supplies until medicaid can pay. TOC will continue to follow.   1515 CM continues to attempt to contact financial counselor to determine if medicaid application has been initiated. Secure chat has now been sent to Burnett Harry in the financial counselor department. Awaiting response.   CM has been asked by palliative NP about hospital bed per patient /family request. CM followed up with [facility] supervisor Sharol Roussel to determine if patient will be eligible for LOG for hospital bed. Patient will need to have a medical reason why he will need a hospital bed. If there is a medical reason for bed [facility] supervisor will approve for 30 days. After the 30 days the patient will be responsible for the cost . CM went to bedside to discuss with patient and family. Patient is currently in surgery per NT. There is no family at the bedside.  TOC will continue to follow  1604 CM at bedside with mother in law and daughter. Mother in law states that medicaid application was filed today. TOC will continue to follow.        Expected Discharge Plan and Services                                                Social Determinants of Health (SDOH) Interventions SDOH Screenings   Food Insecurity: No Food Insecurity (06/14/2023)  Housing: Low Risk  (06/14/2023)  Transportation Needs: No Transportation Needs (06/14/2023)  Utilities: Not At Risk (06/14/2023)  Depression (PHQ2-9): Low Risk  (06/12/2023)  Tobacco Use: Medium Risk (06/14/2023)    Readmission Risk Interventions     No data to display

## 2023-06-18 NOTE — Anesthesia Postprocedure Evaluation (Signed)
Anesthesia Post Note  Patient: Jesus Haynes  Procedure(s) Performed: PLACEMENT OF J TUBE     Patient location during evaluation: PACU Anesthesia Type: General Level of consciousness: awake and alert Pain management: pain level controlled Vital Signs Assessment: post-procedure vital signs reviewed and stable Respiratory status: spontaneous breathing, nonlabored ventilation and respiratory function stable Cardiovascular status: blood pressure returned to baseline and stable Postop Assessment: no apparent nausea or vomiting Anesthetic complications: yes  Encounter Notable Events  Notable Event Outcome Phase Comment  Difficult to intubate - expected  Intraprocedure Filed from anesthesia note documentation.    Last Vitals:  Vitals:   06/18/23 1730 06/18/23 1746  BP: 124/76 126/79  Pulse: 93 92  Resp: 11 14  Temp: 36.7 C 36.7 C  SpO2: 92% 92%    Last Pain:  Vitals:   06/18/23 1746  TempSrc: Oral  PainSc:                  Kaily Wragg,W. EDMOND

## 2023-06-18 NOTE — Transfer of Care (Signed)
Immediate Anesthesia Transfer of Care Note  Patient: Jesus Haynes  Procedure(s) Performed: PLACEMENT OF J TUBE  Patient Location: PACU  Anesthesia Type:General  Level of Consciousness: awake, drowsy, and patient cooperative  Airway & Oxygen Therapy: Patient Spontanous Breathing and Patient connected to face mask oxygen  Post-op Assessment: Report given to RN and Post -op Vital signs reviewed and stable  Post vital signs: Reviewed and stable  Last Vitals:  Vitals Value Taken Time  BP 126/76 06/18/23 1652  Temp    Pulse 105 06/18/23 1654  Resp 12 06/18/23 1654  SpO2 99 % 06/18/23 1654  Vitals shown include unfiled device data.  Last Pain:  Vitals:   06/18/23 1447  TempSrc: Oral  PainSc: 0-No pain      Patients Stated Pain Goal: 2 (06/18/23 0600)  Complications:  Encounter Notable Events  Notable Event Outcome Phase Comment  Difficult to intubate - expected  Intraprocedure Filed from anesthesia note documentation.

## 2023-06-18 NOTE — Interval H&P Note (Signed)
History and Physical Interval Note:  06/18/2023 3:00 PM  Jesus Haynes  has presented today for surgery, with the diagnosis of GASTRIC CANCER.  The various methods of treatment have been discussed with the patient and family. After consideration of risks, benefits and other options for treatment, the patient has consented to  Procedure(s): PLACEMENT OF J TUBE (N/A) as a surgical intervention.  The patient's history has been reviewed, patient examined, no change in status, stable for surgery.  I have reviewed the patient's chart and labs.  Questions were answered to the patient's satisfaction. Discussed case with case management, and patient should be able to receive 1 month of feeding supplies while Medicaid application is pending. Reviewed this with the patient. He agrees to proceed with J tube placement. All questions answered.    Fritzi Mandes

## 2023-06-18 NOTE — Consult Note (Signed)
[facility]  [phone]) [phone]   HEMATOLOGY/ONCOLOGY IN-PATIENT CONSULTATION NOTE   PATIENT NAME: Jesus Haynes   MR#: [id number] DOB: 08-08-1975 CSN#: [id number]   DATE OF SERVICE: 06/18/2023  Requesting Physician: [facility]   Patient Care Team: Ivonne Andrew, NP as PCP - General (Pulmonary Disease) Clemons, Edmund Hilda Care Management  REASON FOR CONSULTATION:  Management decisions in a patient with recently diagnosed GE junction adenocarcinoma, very locally advanced.  ASSESSMENT & PLAN:  Adenocarcinoma of gastroesophageal junction (HCC) -Please review HPI/oncology history for additional details and timeline of events.  -Patient was seen by me during recent hospitalization. When he presented to our clinic to establish care with me on 06/14/23, he was noted to be unable to eat for the last several days and has been feeling dizzy, lightheaded.  Had significant weight loss.  Despite IV hydration in clinic, he continued to remain dizzy and hence we admitted him to the hospital for further evaluation and management.  -Reviewed staging PET/CT findings with the patient today in greater detail.  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  Discussed treatment options. All treatment options are palliative in nature and not curative intent. Plan is to proceed with palliative systemic treatment using FOLFOX plus nivolumab, pending resolution of acute issues.  -I will also discuss with our radiation oncology colleagues to see if he would benefit from some palliative radiation to improve his swallowing.  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  - Surgery department placed J-tube today to assist with his nutrition.  Cancer associated pain - Continue aggressive pain management.  Palliative care team on board and we greatly  appreciate their recommendations.  Anemia due to chronic blood loss -Continue to monitor CBCD daily and transfuse as needed to maintain hemoglobin closer to 8 because of his symptomatology.   Rest of care as per primary team and other specialties.  Thanks for the opportunity to participate in the care of this patient. Please contact me if there are any questions.  Upon discharge, we will arrange appointment for patient to follow up with me at [facility] cancer center.   Meryl Crutch, MD Medical Oncology and Hematology 06/18/2023 10:45 PM   HISTORY OF PRESENT ILLNESS:  Jesus Haynes is a 47 y.o. gentleman with history of asthma, presented to the ED on 05/27/2023 with complaints of epigastric burning abdominal pain, nausea, retching.  He also reported some dark tarry stools for 3 days prior to arrival.  He was previously seen in the ED at [facility] on 05/08/2023 with complaints of chest or right upper quadrant abdominal pain.  Workup was unremarkable at that time and he was sent home with Protonix and Carafate.  Since his symptoms persisted, he presented to the ED again.   In the ED, his hemoglobin was noted to be down to 10, compared to 13 previously.  With concern for upper GI bleed, was admitted for further evaluation and management.   Dr. Ewing Schlein performed upper GI endoscopy on 05/28/2023.  It showed partially obstructing, likely malignant esophageal tumor in the lower third of the esophagus.  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  Normal duodenum.  Pathology from GE junction mass came back positive for invasive adenocarcinoma, moderately to poorly differentiated.  Immunostains pending.   CT chest abdomen pelvis on 05/28/2023 showed abnormal wall thickening at the GE junction, compatible  with patient's known carcinoma.  Prominent lymph nodes in the retrocrural region on the right side, adjacent to the hiatal hernia.  Abnormal low-density soft tissue in the gastrohepatic region,  abutting the lesser curvature and proximal stomach, worrisome for metastatic disease.  Periportal, periceliac, gastrosplenic and left retroperitoneal lymphadenopathy, worrisome for metastatic involvement.  Hypodensities in the liver were also noted, worrisome for metastatic disease, largest 1 measuring 13 mm in the inferior right lobe.  Fat stranding surrounding the body and tail of pancreas, suggesting acute pancreatitis.   We were consulted during his hospitalization on 05/29/2023 for additional recommendations given new diagnosis of GE junction adenocarcinoma.   On 06/07/2023, staging PET/CT showed large hypermetabolic mass involving the distal esophagus and proximal stomach consistent with known primary esophageal carcinoma.  Multiple hypermetabolic retroperitoneal, lower mediastinal lymph nodes consistent with locally advanced disease.  Suspected direct tumor extension into the pancreatic body.  Hypermetabolic node or peritoneal implant within the omentum.  No other evidence of metastatic disease.   Plan for palliative systemic treatments with FOLFOX plus nivolumab.  Oncology History  Adenocarcinoma of gastroesophageal junction (HCC)  05/29/2023 Initial Diagnosis   Adenocarcinoma of gastroesophageal junction (HCC)   06/19/2023 -  Chemotherapy   Patient is on Treatment Plan : GASTROESOPHAGEAL FOLFOX + Nivolumab q14d       MEDICAL HISTORY Past Medical History:  Diagnosis Date   Asthma      SURGICAL HISTORY Past Surgical History:  Procedure Laterality Date   BIOPSY  05/28/2023   Procedure: BIOPSY;  Surgeon: Vida Rigger, MD;  Location: [facility];  Service: Gastroenterology;;   ESOPHAGOGASTRODUODENOSCOPY (EGD) WITH PROPOFOL N/A 05/28/2023   Procedure: ESOPHAGOGASTRODUODENOSCOPY (EGD) WITH PROPOFOL;  Surgeon: Vida Rigger, MD;  Location: [facility];  Service: Gastroenterology;  Laterality: N/A;   IR IMAGING GUIDED PORT INSERTION  06/13/2023     ALLERGIES  No Known  Allergies  FAMILY HISTORY  Family History  Problem Relation Age of Onset   Heart failure Father      SOCIAL HISTORY   Social History   Socioeconomic History   Marital status: Single    Spouse name: Not on file   Number of children: Not on file   Years of education: Not on file   Highest education level: Not on file  Occupational History   Not on file  Tobacco Use   Smoking status: Former    Current packs/day: 0.00    Types: Cigarettes    Quit date: 12/26/2012    Years since quitting: 10.4   Smokeless tobacco: Never  Vaping Use   Vaping status: Former   Quit date: 08/21/2018   Substances: Nicotine  Substance and Sexual Activity   Alcohol use: No   Drug use: No   Sexual activity: Not Currently  Other Topics Concern   Not on file  Social History Narrative   Not on file   Social Drivers of Health   Financial Resource Strain: Not on file  Food Insecurity: No Food Insecurity (06/14/2023)   Hunger Vital Sign    Worried About Running Out of Food in the Last Year: Never true    Ran Out of Food in the Last Year: Never true  Transportation Needs: No Transportation Needs (06/14/2023)   PRAPARE - Administrator, Civil Service (Medical): No    Lack of Transportation (Non-Medical): No  Physical Activity: Not on file  Stress: Not on file  Social Connections: Not on file  Intimate Partner Violence: Not At Risk (06/14/2023)  Humiliation, Afraid, Rape, and Kick questionnaire    Fear of Current or Ex-Partner: No    Emotionally Abused: No    Physically Abused: No    Sexually Abused: No    CURRENT MEDICATIONS   Current Outpatient Medications  Medication Instructions   albuterol (PROVENTIL) 2.5 mg, Nebulization, Every 6 hours PRN   albuterol (VENTOLIN HFA) 108 (90 Base) MCG/ACT inhaler 1-2 puffs, Inhalation, Every 4 hours PRN   feeding supplement (ENSURE ENLIVE / ENSURE PLUS) LIQD 237 mLs, Oral, 2 times daily between meals   ondansetron (ZOFRAN) 4 mg, Oral,  Every 6 hours PRN   ondansetron (ZOFRAN-ODT) 4 MG disintegrating tablet Dissolve 1 tablet (4 mg total) in mouth every 8 (eight) hours as needed.   oxyCODONE (ROXICODONE) 5 mg, Oral, Every 4 hours PRN   pantoprazole (PROTONIX) 40 mg, Oral, Daily   traZODone (DESYREL) 25 mg, Oral, At bedtime PRN     REVIEW OF SYSTEMS   Review of Systems - Oncology  All other pertinent review of systems is negative except as mentioned above in HPI  PHYSICAL EXAMINATION  ECOG PERFORMANCE STATUS: 2 - Symptomatic, <50% confined to bed  Vitals:   06/18/23 1746 06/18/23 2044  BP: 126/79 135/87  Pulse: 92 98  Resp: 14 16  Temp: 98.1 F (36.7 C) 98.6 F (37 C)  SpO2: 92% 93%   Filed Weights   06/15/23 2053  Weight: 177 lb 11.1 oz (80.6 kg)    Physical Exam Constitutional:      General: He is not in acute distress.    Appearance: Normal appearance.  HENT:     Head: Normocephalic and atraumatic.  Eyes:     General: No scleral icterus.    Conjunctiva/sclera: Conjunctivae normal.  Cardiovascular:     Rate and Rhythm: Normal rate and regular rhythm.     Heart sounds: Normal heart sounds.  Pulmonary:     Effort: Pulmonary effort is normal.     Breath sounds: Normal breath sounds.  Abdominal:     General: There is no distension.  Musculoskeletal:     Right lower leg: No edema.     Left lower leg: No edema.  Neurological:     General: No focal deficit present.     Mental Status: He is alert and oriented to person, place, and time.  Psychiatric:        Mood and Affect: Mood normal.        Behavior: Behavior normal.        Thought Content: Thought content normal.    LABORATORY DATA:   I have reviewed the data as listed  Results for orders placed or performed during the hospital encounter of 06/14/23 (from the past 24 hours)  CBC   Collection Time: 06/18/23  6:06 AM  Result Value Ref Range   WBC 7.0 4.0 - 10.5 K/uL   RBC 3.38 (L) 4.22 - 5.81 MIL/uL   Hemoglobin 8.4 (L) 13.0 - 17.0  g/dL   HCT 69.6 (L) 29.5 - 28.4 %   MCV 81.7 80.0 - 100.0 fL   MCH 24.9 (L) 26.0 - 34.0 pg   MCHC 30.4 30.0 - 36.0 g/dL   RDW 13.2 44.0 - 10.2 %   Platelets 288 150 - 400 K/uL   nRBC 0.0 0.0 - 0.2 %  Comprehensive metabolic panel   Collection Time: 06/18/23  6:06 AM  Result Value Ref Range   Sodium 136 135 - 145 mmol/L   Potassium 3.7 3.5 - 5.1 mmol/L  Chloride 106 98 - 111 mmol/L   CO2 20 (L) 22 - 32 mmol/L   Glucose, Bld 98 70 - 99 mg/dL   BUN 11 6 - 20 mg/dL   Creatinine, Ser 6.21 0.61 - 1.24 mg/dL   Calcium 8.6 (L) 8.9 - 10.3 mg/dL   Total Protein 6.1 (L) 6.5 - 8.1 g/dL   Albumin 3.0 (L) 3.5 - 5.0 g/dL   AST 17 15 - 41 U/L   ALT 19 0 - 44 U/L   Alkaline Phosphatase 49 38 - 126 U/L   Total Bilirubin 0.9 <1.2 mg/dL   GFR, Estimated >30 >86 mL/min   Anion gap 10 5 - 15      RADIOGRAPHIC STUDIES:  I have personally reviewed the radiological images as listed and agree with the findings in the report.  VAS US CAROTID Result Date: 06/17/2023 Carotid Arterial Duplex Study Patient Name:  Jesus Haynes  Date of Exam:   06/16/2023 Medical Rec #: [id number]       Accession #:    [id number] Date of Birth: 11/12/1975      Patient Gender: M Patient Age:   61 years Exam Location:  [facility] Procedure:      VAS US CAROTID Referring Phys: DAVID ORTIZ --------------------------------------------------------------------------------  Indications:       Syncope. Comparison Study:  No prior exam. Performing Technologist: Fernande Bras  Examination Guidelines: A complete evaluation includes B-mode imaging, spectral Doppler, color Doppler, and power Doppler as needed of all accessible portions of each vessel. Bilateral testing is considered an integral part of a complete examination. Limited examinations for reoccurring indications may be performed as noted.  Right Carotid Findings: +----------+--------+--------+--------+------------------+--------+           PSV cm/sEDV  cm/sStenosisPlaque DescriptionComments +----------+--------+--------+--------+------------------+--------+ CCA Prox  125     26                                         +----------+--------+--------+--------+------------------+--------+ CCA Distal101     25                                         +----------+--------+--------+--------+------------------+--------+ ICA Prox  71      24                                tortuous +----------+--------+--------+--------+------------------+--------+ ICA Mid   75      27                                tortuous +----------+--------+--------+--------+------------------+--------+ ICA Distal63      27                                tortuous +----------+--------+--------+--------+------------------+--------+ ECA       81      13                                         +----------+--------+--------+--------+------------------+--------+ +----------+--------+-------+------------+-------------------+           PSV cm/sEDV  cmsDescribe    Arm Pressure (mmHG) +----------+--------+-------+------------+-------------------+ Subclavian               Not assessed                    +----------+--------+-------+------------+-------------------+ +---------+--------+--+--------+--+ VertebralPSV cm/s74EDV cm/s24 +---------+--------+--+--------+--+ Recent port placement, right subclavian artery not visualized due to bandages. Left Carotid Findings: +----------+--------+--------+--------+------------------+--------+           PSV cm/sEDV cm/sStenosisPlaque DescriptionComments +----------+--------+--------+--------+------------------+--------+ CCA Prox  141     24                                         +----------+--------+--------+--------+------------------+--------+ CCA Distal95      21                                         +----------+--------+--------+--------+------------------+--------+ ICA Prox  117      39                                         +----------+--------+--------+--------+------------------+--------+ ICA Mid   102     30                                         +----------+--------+--------+--------+------------------+--------+ ICA Distal95      38                                         +----------+--------+--------+--------+------------------+--------+ ECA       97      13                                         +----------+--------+--------+--------+------------------+--------+ +----------+--------+--------+--------+-------------------+           PSV cm/sEDV cm/sDescribeArm Pressure (mmHG) +----------+--------+--------+--------+-------------------+ [id number]                                         +----------+--------+--------+--------+-------------------+ +---------+--------+--+--------+--+ VertebralPSV cm/s71EDV cm/s28 +---------+--------+--+--------+--+   Summary: Right Carotid: The ECA appears <50% stenosed. The extracranial vessels were                near-normal with only minimal wall thickening or plaque. Left Carotid: Velocities in the left ICA are consistent with a 1-39% stenosis.               The ECA appears <50% stenosed. Vertebrals:  Bilateral vertebral arteries demonstrate antegrade flow. Subclavians: Right subclavian artery was not visualized. Normal flow              hemodynamics were seen in the left subclavian artery. *See table(s) above for measurements and observations.  Electronically signed by Heath Lark on 06/17/2023 at 7:33:58 AM.    Final    ECHOCARDIOGRAM COMPLETE Result Date: 06/15/2023    ECHOCARDIOGRAM REPORT   Patient Name:   Jesus Haynes  Date of Exam: 06/15/2023 Medical Rec #:  [id number]      Height:       66.0 in Accession #:    [id number]     Weight:       174.0 lb Date of Birth:  [birth year]     BSA:          1.885 m Patient Age:    47 years       BP:           113/68 mmHg Patient Gender: M              HR:            70 bpm. Exam Location:  Inpatient Procedure: 2D Echo Indications:   syncope  History:       Patient has no prior history of Echocardiogram examinations.                Cancer.  Sonographer:   Delcie Roch RDCS Referring      [phone] DAVID MANUEL ORTIZ Phys: IMPRESSIONS  1. Left ventricular ejection fraction, by estimation, is 60 to 65%. The left ventricle has normal function. The left ventricle has no regional wall motion abnormalities. Left ventricular diastolic parameters were normal.  2. Right ventricular systolic function is normal. The right ventricular size is normal.  3. The mitral valve is normal in structure. No evidence of mitral valve regurgitation. No evidence of mitral stenosis.  4. The aortic valve is normal in structure. Aortic valve regurgitation is not visualized. No aortic stenosis is present.  5. The inferior vena cava is normal in size with greater than 50% respiratory variability, suggesting right atrial pressure of 3 mmHg. FINDINGS  Left Ventricle: Left ventricular ejection fraction, by estimation, is 60 to 65%. The left ventricle has normal function. The left ventricle has no regional wall motion abnormalities. The left ventricular internal cavity size was normal in size. There is  no left ventricular hypertrophy. Left ventricular diastolic parameters were normal. Right Ventricle: The right ventricular size is normal. No increase in right ventricular wall thickness. Right ventricular systolic function is normal. Left Atrium: Left atrial size was normal in size. Right Atrium: Right atrial size was normal in size. Pericardium: There is no evidence of pericardial effusion. Mitral Valve: The mitral valve is normal in structure. No evidence of mitral valve regurgitation. No evidence of mitral valve stenosis. Tricuspid Valve: The tricuspid valve is normal in structure. Tricuspid valve regurgitation is not demonstrated. No evidence of tricuspid stenosis. Aortic Valve: The aortic valve is  normal in structure. Aortic valve regurgitation is not visualized. No aortic stenosis is present. Pulmonic Valve: The pulmonic valve was normal in structure. Pulmonic valve regurgitation is not visualized. No evidence of pulmonic stenosis. Aorta: The aortic root is normal in size and structure. Venous: The inferior vena cava is normal in size with greater than 50% respiratory variability, suggesting right atrial pressure of 3 mmHg. IAS/Shunts: No atrial level shunt detected by color flow Doppler.  LEFT VENTRICLE PLAX 2D LVIDd:         4.70 cm   Diastology LVIDs:         2.60 cm   LV e' medial:    11.20 cm/s LV PW:         0.90 cm   LV E/e' medial:  5.7 LV IVS:        0.90 cm   LV e' lateral:   13.10 cm/s LVOT diam:     2.10  cm   LV E/e' lateral: 4.9 LV SV:         73 LV SV Index:   39 LVOT Area:     3.46 cm  RIGHT VENTRICLE             IVC RV Basal diam:  2.50 cm     IVC diam: 1.40 cm RV S prime:     17.60 cm/s TAPSE (M-mode): 2.7 cm LEFT ATRIUM             Index        RIGHT ATRIUM           Index LA diam:        3.60 cm 1.91 cm/m   RA Area:     16.20 cm LA Vol (A2C):   42.0 ml 22.28 ml/m  RA Volume:   41.10 ml  21.81 ml/m LA Vol (A4C):   43.5 ml 23.08 ml/m LA Biplane Vol: 44.5 ml 23.61 ml/m  AORTIC VALVE LVOT Vmax:   111.00 cm/s LVOT Vmean:  72.800 cm/s LVOT VTI:    0.210 m  AORTA Ao Root diam: 3.70 cm Ao Asc diam:  3.60 cm MITRAL VALVE MV Area (PHT): 4.39 cm    SHUNTS MV Decel Time: 173 msec    Systemic VTI:  0.21 m MV E velocity: 64.30 cm/s  Systemic Diam: 2.10 cm MV A velocity: 63.00 cm/s MV E/A ratio:  1.02 Mihai Croitoru MD Electronically signed by Thurmon Fair MD Signature Date/Time: 06/15/2023/5:08:37 PM    Final    IR IMAGING GUIDED PORT INSERTION Result Date: 06/13/2023 INDICATION: Adenocarcinoma of the gastroesophageal junction. Port-A-Cath needed for treatment. EXAM: FLUOROSCOPIC AND ULTRASOUND GUIDED PLACEMENT OF A SUBCUTANEOUS PORT COMPARISON:  None Available. MEDICATIONS: Moderate  sedation ANESTHESIA/SEDATION: Moderate (conscious) sedation was employed during this procedure. A total of Versed 3 mg and fentanyl 150 mcg was administered intravenously at the order of the provider performing the procedure. Total intra-service moderate sedation time: 29 minutes. Patient's level of consciousness and vital signs were monitored continuously by radiology nurse throughout the procedure under the supervision of the provider performing the procedure. FLUOROSCOPY TIME:  Radiation Exposure Index (as provided by the fluoroscopic device): 1 mGy Kerma COMPLICATIONS: None immediate. PROCEDURE: The procedure, risks, benefits, and alternatives were explained to the patient. Questions regarding the procedure were encouraged and answered. The patient understands and consents to the procedure. Patient was placed supine on the interventional table. Ultrasound confirmed a patent right internal jugular vein. Ultrasound image was saved for documentation. The right chest and neck were cleaned with a skin antiseptic and a sterile drape was placed. Maximal barrier sterile technique was utilized including caps, mask, sterile gowns, sterile gloves, sterile drape, hand hygiene and skin antiseptic. The right neck was anesthetized with 1% lidocaine. Small incision was made in the right neck with a blade. Micropuncture set was placed in the right internal jugular vein with ultrasound guidance. The micropuncture wire was used for measurement purposes. The right chest was anesthetized with 1% lidocaine with epinephrine. #15 blade was used to make an incision and a subcutaneous port pocket was formed. 8 french Power Port was assembled. Subcutaneous tunnel was formed with a stiff tunneling device. The port catheter was brought through the subcutaneous tunnel. The port was placed in the subcutaneous pocket. The micropuncture set was exchanged for a peel-away sheath. The catheter was placed through the peel-away sheath and the tip  was positioned at the superior cavoatrial junction. Catheter placement was confirmed with  fluoroscopy. The port was accessed and flushed with heparinized saline. The port pocket was closed using two layers of absorbable sutures and Dermabond. The vein skin site was closed using a single layer of absorbable suture and Dermabond. Sterile dressings were applied. Patient tolerated the procedure well without an immediate complication. Ultrasound and fluoroscopic images were taken and saved for this procedure. IMPRESSION: Placement of a subcutaneous power-injectable port device. Catheter tip at the superior cavoatrial junction. Electronically Signed   By: Richarda Overlie M.D.   On: 06/13/2023 13:39   NM PET Image Initial (PI) Skull Base To Thigh Result Date: 06/07/2023 CLINICAL DATA:  Initial treatment strategy for newly diagnosed adenocarcinoma at the GE junction. Possible hepatic metastatic disease. EXAM: NUCLEAR MEDICINE PET SKULL BASE TO THIGH TECHNIQUE: 9.5 mCi F-18 FDG was injected intravenously. Full-ring PET imaging was performed from the skull base to thigh after the radiotracer. CT data was obtained and used for attenuation correction and anatomic localization. Fasting blood glucose: 111 mg/dl COMPARISON:  CT of the chest, abdomen and pelvis 05/28/2023. FINDINGS: Mediastinal blood pool activity: SUV max 2.8 NECK: No hypermetabolic cervical lymph nodes are identified.Fairly symmetric activity within the lymphoid tissue of Waldeyer's ring is within physiologic limits. No suspicious activity identified within the pharyngeal mucosal space. Incidental CT findings: none CHEST: As seen on recent CT, there is a large mass involving the distal esophagus with extension into the gastric cardia which is hypermetabolic. There are several hypermetabolic distal paraesophageal and right paratracheal lymph nodes, including a 1.0 cm left paraesophageal node on image 82/4 (SUV max 6.2) and a 1.0 cm right retrocrural node on image  92/4 (SUV max 8.1). No hypermetabolic upper mediastinal, hilar or axillary lymph nodes. Low-level hilar activity is within physiologic limits. No hypermetabolic pulmonary activity or suspicious nodularity. Incidental CT findings: Stable small calcified right upper lobe granuloma. ABDOMEN/PELVIS: As above, large hypermetabolic mass extending from the distal esophagus into the proximal stomach. This has an SUV max of 27.7 and the hypermetabolic activity spans an area measuring up to 8.1 x 8.1 cm transverse. There is direct extension of hypermetabolic activity into the porta hepatis, although no discrete hypermetabolic liver lesions are identified. There are multiple hypermetabolic lymph nodes in the gastrohepatic ligament with inferior extension and probable direct involvement of the pancreas. There is hypermetabolic activity throughout the pancreas, suspicious for tumor involvement. Additional hypermetabolic abdominal lymph nodes include a 1.9 cm node at the splenic hilum on image 92/4 (SUV max 12.1) and a lymph node or peritoneal implants within the omentum measuring 2.1 x 1.1 cm on image 105/4 (SUV max 15.6). There are hypermetabolic retroperitoneal lymph nodes extending inferior to the renal veins (SUV max 18.5). No hypermetabolic lymph nodes identified in the pelvis. There is no adrenal mass. Incidental CT findings: Mild iliac atherosclerosis.  No ascites. SKELETON: There is no hypermetabolic activity to suggest osseous metastatic disease. Incidental CT findings: none IMPRESSION: 1. Large hypermetabolic mass involving the distal esophagus and proximal stomach consistent with known primary esophageal carcinoma. 2. Multiple hypermetabolic retroperitoneal and lower mediastinal lymph nodes as described, consistent with locally advanced disease. Suspected direct tumor extension into the pancreatic body. Hypermetabolic node or peritoneal implant within the omentum. 3. No evidence of metastatic disease within the neck,  chest or pelvis. No osseous abnormalities. Electronically Signed   By: Carey Bullocks M.D.   On: 06/07/2023 17:48   CT CHEST ABDOMEN PELVIS W CONTRAST Result Date: 05/28/2023 CLINICAL DATA:  Metastatic disease evaluation, gastroesophageal junction cancer. EXAM: CT CHEST,  ABDOMEN, AND PELVIS WITH CONTRAST TECHNIQUE: Multidetector CT imaging of the chest, abdomen and pelvis was performed following the standard protocol during bolus administration of intravenous contrast. RADIATION DOSE REDUCTION: This exam was performed according to the departmental dose-optimization program which includes automated exposure control, adjustment of the mA and/or kV according to patient size and/or use of iterative reconstruction technique. CONTRAST:  OMNIPAQUE IOHEXOL 300 MG/ML  SOLN COMPARISON:  CT chest 08/08/2017 FINDINGS: CT CHEST FINDINGS Cardiovascular: No significant vascular findings. Normal heart size. No pericardial effusion. Mediastinum/Nodes: Visualized thyroid gland is within normal limits. A small hiatal hernia is present with thickening at the gastroesophageal junction likely related to patient's known carcinoma. Remaining esophagus is nondilated. There are prominent lymph nodes adjacent to the hiatal hernia in the retrocrural region on the right measuring up to 7 mm. No other enlarged mediastinal, hilar or axillary lymph nodes are seen. Lungs/Pleura: There are atelectatic changes in the lingula and bilateral lower lobes. The lungs are otherwise clear. There is no pleural effusion or pneumothorax. There is a calcified granuloma in the right upper lobe. Musculoskeletal: No chest wall mass or suspicious bone lesions identified. CT ABDOMEN PELVIS FINDINGS Hepatobiliary: There are 2 hypodensities in the inferior right lobe of the liver measuring up to 13 mm. There is a subcentimeter hypodensity in the left lobe of the liver image 2/54. Liver parenchyma otherwise appears within normal limits. Gallbladder and bile  ducts are within normal limits. There is fat stranding surrounding the body and tail of the pancreas. No ductal dilatation or fluid collection. Spleen: Normal in size without focal abnormality. Adrenals/Urinary Tract: Adrenal glands are unremarkable. Kidneys are normal, without renal calculi, focal lesion, or hydronephrosis. Bladder is unremarkable. Stomach/Bowel: There is abnormal wall thickening in the gastroesophageal junction. A small hiatal hernia is present. Findings are likely related to patient's known carcinoma. Distal stomach, small bowel, colon and appendix are otherwise within normal limits. Vascular/Lymphatic: The aorta and IVC are patent. Portal vein and superior mesenteric vein are patent. Splenic vein is grossly patent. Abnormal low-density soft tissues seen in the gastro hepatic region abutting the lesser curvature and proximal stomach. The 2 largest areas measure 2.2 x 5.1 cm image 2/56 and 2.5 x 3.1 cm image 2/55. There is periportal lymphadenopathy with largest lymph node measuring up to 12 mm short axis image 2/60. There is paraceliac lymphadenopathy abutting the posterior body of the pancreas With largest lymph node measuring 3.7 x 1.7 cm image 2/60. Soft tissue nodular density seen in the gastrosplenic region measuring 2.1 x 1.3 cm image 2/54. There is a mesenteric nodule anterior to the gastric antrum measuring 1.5 x 1.4 cm image 2/61. There are additional numerous scattered nonenlarged peripancreatic and central mesenteric lymph nodes. There is left retroperitoneal lymphadenopathy at the level of the kidneys with largest lymph node measuring 14 mm short axis image 2/71. Reproductive: Prostate is unremarkable. Other: There is a small fat containing right inguinal and umbilical hernia. There is no ascites. Musculoskeletal: No acute fracture or focal osseous lesion. IMPRESSION: 1. Abnormal wall thickening at the gastroesophageal junction compatible with patient's known carcinoma. 2. Prominent  lymph nodes in the retrocrural region on the right adjacent to the hiatal hernia. 3. Abnormal low-density soft tissue in the gastrohepatic region abutting the lesser curvature and proximal stomach worrisome for metastatic disease. 4. Periportal, paraceliac, gastrosplenic and left retroperitoneal lymphadenopathy worrisome for metastatic disease. 5. Hypodensities in the liver worrisome for metastatic disease. 6. Fat stranding surrounding the body and tail of the  pancreas worrisome for acute pancreatitis. No fluid collection. Electronically Signed   By: Darliss Cheney M.D.   On: 05/28/2023 23:01     This document was completed utilizing speech recognition software. Grammatical errors, random word insertions, pronoun errors, and incomplete sentences are an occasional consequence of this system due to software limitations, ambient noise, and hardware issues. Any formal questions or concerns about the content, text or information contained within the body of this dictation should be directly addressed to the provider for clarification.

## 2023-06-18 NOTE — Plan of Care (Signed)

## 2023-06-18 NOTE — Plan of Care (Signed)
  Problem: Clinical Measurements: Goal: Diagnostic test results will improve Outcome: Progressing   Problem: Coping: Goal: Level of anxiety will decrease Outcome: Progressing   Problem: Elimination: Goal: Will not experience complications related to bowel motility Outcome: Progressing   Problem: Pain Management: Goal: General experience of comfort will improve Outcome: Progressing

## 2023-06-18 NOTE — Assessment & Plan Note (Signed)
-   Continue aggressive pain management.  Palliative care team on board and we greatly appreciate their recommendations.

## 2023-06-18 NOTE — Assessment & Plan Note (Signed)
-  Continue to monitor CBCD daily and transfuse as needed to maintain hemoglobin closer to 8 because of his symptomatology.

## 2023-06-18 NOTE — Progress Notes (Signed)
EKG performed and hard copy placed in patients chart.

## 2023-06-18 NOTE — Progress Notes (Signed)
[facility] Progress Note     Subjective: Still has some gagging and difficulty swallowing especially with pain. Seen by palliative care yesterday.   Objective: Vital signs in last 24 hours: Temp:  [97.9 F (36.6 C)-98.5 F (36.9 C)] 97.9 F (36.6 C) (12/17 0435) Pulse Rate:  [82-84] 84 (12/17 0435) Resp:  [14] 14 (12/17 0435) BP: (123-151)/(78-91) 124/78 (12/17 0435) SpO2:  [98 %-99 %] 98 % (12/17 0435) Last BM Date : 06/07/23  Intake/Output from previous day: 12/16 0701 - 12/17 0700 In: 474 [P.O.:474] Out: 600 [Urine:600] Intake/Output this shift: No intake/output data recorded.  PE: Gen:  Alert, NAD, dry heaving  Pulm:  Normal work of breathing on room air Abd: Soft, nondistended, nontender to palpation. Skin: warm and dry, no rashes  Psych: A&Ox3   Lab Results:  Recent Labs    06/17/23 0904 06/18/23 0606  WBC 6.9 7.0  HGB 8.3* 8.4*  HCT 26.3* 27.6*  PLT 295 288   BMET Recent Labs    06/17/23 0904 06/18/23 0606  NA 136 136  K 3.5 3.7  CL 105 106  CO2 20* 20*  GLUCOSE 100* 98  BUN 11 11  CREATININE 0.58* 0.63  CALCIUM 8.3* 8.6*   PT/INR No results for input(s): "LABPROT", "INR" in the last 72 hours. CMP     Component Value Date/Time   NA 136 06/18/2023 0606   NA 139 06/12/2023 1413   K 3.7 06/18/2023 0606   CL 106 06/18/2023 0606   CO2 20 (L) 06/18/2023 0606   GLUCOSE 98 06/18/2023 0606   BUN 11 06/18/2023 0606   BUN 14 06/12/2023 1413   CREATININE 0.63 06/18/2023 0606   CALCIUM 8.6 (L) 06/18/2023 0606   PROT 6.1 (L) 06/18/2023 0606   PROT 7.0 06/12/2023 1413   ALBUMIN 3.0 (L) 06/18/2023 0606   ALBUMIN 3.9 (L) 06/12/2023 1413   AST 17 06/18/2023 0606   ALT 19 06/18/2023 0606   ALKPHOS 49 06/18/2023 0606   BILITOT 0.9 06/18/2023 0606   BILITOT 0.4 06/12/2023 1413   GFRNONAA >60 06/18/2023 0606   GFRAA >60 08/24/2018 0750   Lipase     Component Value Date/Time   LIPASE 142 (H) 05/27/2023 1102        Studies/Results: VAS US CAROTID Result Date: 06/17/2023 Carotid Arterial Duplex Study Patient Name:  Jesus Haynes  Date of Exam:   06/16/2023 Medical Rec #: [id number]       Accession #:    [id number] Date of Birth: 1976/05/08      Patient Gender: M Patient Age:   47 years Exam Location:  [facility] Procedure:      VAS US CAROTID Referring Phys: DAVID ORTIZ --------------------------------------------------------------------------------  Indications:       Syncope. Comparison Study:  No prior exam. Performing Technologist: Fernande Bras  Examination Guidelines: A complete evaluation includes B-mode imaging, spectral Doppler, color Doppler, and power Doppler as needed of all accessible portions of each vessel. Bilateral testing is considered an integral part of a complete examination. Limited examinations for reoccurring indications may be performed as noted.  Right Carotid Findings: +----------+--------+--------+--------+------------------+--------+           PSV cm/sEDV cm/sStenosisPlaque DescriptionComments +----------+--------+--------+--------+------------------+--------+ CCA Prox  125     26                                         +----------+--------+--------+--------+------------------+--------+  CCA Distal101     25                                         +----------+--------+--------+--------+------------------+--------+ ICA Prox  71      24                                tortuous +----------+--------+--------+--------+------------------+--------+ ICA Mid   75      27                                tortuous +----------+--------+--------+--------+------------------+--------+ ICA Distal63      27                                tortuous +----------+--------+--------+--------+------------------+--------+ ECA       81      13                                         +----------+--------+--------+--------+------------------+--------+  +----------+--------+-------+------------+-------------------+           PSV cm/sEDV cmsDescribe    Arm Pressure (mmHG) +----------+--------+-------+------------+-------------------+ Subclavian               Not assessed                    +----------+--------+-------+------------+-------------------+ +---------+--------+--+--------+--+ VertebralPSV cm/s74EDV cm/s24 +---------+--------+--+--------+--+ Recent port placement, right subclavian artery not visualized due to bandages. Left Carotid Findings: +----------+--------+--------+--------+------------------+--------+           PSV cm/sEDV cm/sStenosisPlaque DescriptionComments +----------+--------+--------+--------+------------------+--------+ CCA Prox  141     24                                         +----------+--------+--------+--------+------------------+--------+ CCA Distal95      21                                         +----------+--------+--------+--------+------------------+--------+ ICA Prox  117     39                                         +----------+--------+--------+--------+------------------+--------+ ICA Mid   102     30                                         +----------+--------+--------+--------+------------------+--------+ ICA Distal95      38                                         +----------+--------+--------+--------+------------------+--------+ ECA       97      13                                         +----------+--------+--------+--------+------------------+--------+ +----------+--------+--------+--------+-------------------+  PSV cm/sEDV cm/sDescribeArm Pressure (mmHG) +----------+--------+--------+--------+-------------------+ [id number]                                         +----------+--------+--------+--------+-------------------+ +---------+--------+--+--------+--+ VertebralPSV cm/s71EDV cm/s28  +---------+--------+--+--------+--+   Summary: Right Carotid: The ECA appears <50% stenosed. The extracranial vessels were                near-normal with only minimal wall thickening or plaque. Left Carotid: Velocities in the left ICA are consistent with a 1-39% stenosis.               The ECA appears <50% stenosed. Vertebrals:  Bilateral vertebral arteries demonstrate antegrade flow. Subclavians: Right subclavian artery was not visualized. Normal flow              hemodynamics were seen in the left subclavian artery. *See table(s) above for measurements and observations.  Electronically signed by Heath Lark on 06/17/2023 at 7:33:58 AM.    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 - PET scan w/ "suspected direct tumor extension into the pancreatic body" and LN invasion as above, no bony metastasis noted, no metstatic disease in the neck, chest, or pelvis.  - Patient would benefit from a feeding J tube, however he would need a home feeding pump and does not currently have insurance coverage. Will discuss with TOC regarding his options to obtain a feeding pump and tube feeds. May need to defer surgery until this is sorted out. I discussed this with the patient this morning.   LOS: 4 days   I reviewed nursing notes, Consultant oncology notes, hospitalist notes, last 24 h vitals and pain scores, last 48 h intake and output, last 24 h labs and trends, and last 24 h imaging results.  This care required straight-forward level of medical decision making.   Sophronia Simas, MD [facility] General, Hepatobiliary and Pancreatic  Surgery 06/18/23 8:28 AM

## 2023-06-18 NOTE — Progress Notes (Signed)
Palliative:  HPI: 47 y.o. male  with past medical history of asthma, adenocarcinoma of the gastroesophageal junction, cancer associated pain, GI bleed, anemia admitted on 06/14/2023 with near syncope while drawing labs. Failure to thrive with poor intake and weight loss.   Multiple conversations today with Dr. Sharl Ma, Dr. Arlana Pouch, [facility], and RN. Met with Jesus Haynes and Jesus Haynes (mother-in-law) along with Dr. Arlana Pouch who explains extent of cancer and recommendations for systemic treatment. He explains process of chemo education and beginning conservative dosage and making sure that Jesus Haynes is strong and can tolerate. He is clear that goal of treatment is palliative and not curative. Jesus Haynes was receptive and reports he is not surprised and wishes to take one day at a time. Jesus Haynes and Jesus Haynes both agree to continue to pray for guidance moving forward.   Update: I returned to bedside and discussed with Jesus Haynes, his eldest daughter, and Jesus Haynes. We discussed options for pain management. I do believe he will need long acting agent. Unable to swallow pills and unable to crush long acting pain meds to put through J-tube. I had originally considered fentanyl patch but with no current insurance I am concerned with expensive medication. We discussed methadone and that this is a good option for him and should be more affordable if having to pay out of pocket. We discussed need for EKG and risks vs benefits. Jesus Haynes agrees with utilizing methadone if we feel this is a good option for him. We agree to start this tomorrow. After many discussions all has been worked out to move forward with J-tube.   I also discussed with unit assistance director nursing concern over the weekend. They had other concerns with care over the weekend and I provided Jesus Haynes with patient experience information. I also discussed requests for hospital bed and concerns for insurance coverage to afford medications, etc.   All questions/concerns addressed. Emotional support  provided.   Exam: Alert, oriented. No distress. Breathing regular, unlabored. Abd soft. Moves all extremities.   Plan: - Ongoing goals of care conversation. Will refer to my palliative colleague at [facility].  - Epigastric stabbing, gnawing constant pain: Continue dilaudid 1 mg IV q3h PRN for now. Pain begins to increase after 2.5 hours.  Recommend dilaudid suspension 3 mg every 4 hours PRN when J-tube in place and able to use.  Recommend methadone 5 mg every 12 hours to start tomorrow.  Could consider trial of decadron.  Consider trial of meloxicam.  Does not seem to have neuropathic component. Do not see role for muscle relaxer.  - Bowel Regimen: Sorbitol daily PRN.  Recommend senokot suspension scheduled when J-tube placed.   120 min  Jesus Channel, NP Palliative Medicine Team Pager [phone] (Please see [email or web address] for schedule) Team Phone [phone]    Greater than 50%  of this time was spent counseling and coordinating care related to the above assessment and plan

## 2023-06-18 NOTE — Progress Notes (Signed)
[facility]  PROGRESS NOTE  Jesus Haynes ZHY:[id number] DOB: May 22, 1976 DOA: 06/14/2023 PCP: Ivonne Andrew, NP   Brief HPI:   47 y.o. male with medical history significant of asthma, adenocarcinoma of the gastroesophageal junction, cancer associated pain, history of GI bleed, anemia due to chronic blood loss was sent from the cancer center after he had a near syncopal episode while they were drawing multiple vials of blood.  Patient was then sent to the hospital for further evaluation and treatment.      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 formulation once feeding jejunostomy tube in place -Plan for jejunostomy tube placement today.    Pre-syncope Received IV fluids.  2D echocardiogram with LV ejection fraction of 60 to 65% will monitor closely...     Anemia due to chronic GI blood loss Hemoglobin on 06/13/2021 at 8.4 from 9.3.  Will transfuse for hemoglobin less than 7.  No evidence of bleeding noted.     Asthma Continue albuterol MDI as needed.  Appears compensated at this time    Medications     feeding supplement  237 mL Oral BID BM     Data Reviewed:   CBG:  No results for input(s): "GLUCAP" in the last 168 hours.  SpO2: 98 %    Vitals:   06/17/23 0627 06/17/23 1220 06/17/23 2101 06/18/23 0435  BP: 127/86 (!) 151/91 123/84 124/78  Pulse: 79 82 83 84  Resp: 18  14 14   Temp: 98.2 F (36.8 C) 98.5 F (36.9 C) 98.5 F (36.9 C) 97.9 F (36.6 C)  TempSrc: Oral Oral Oral Oral  SpO2: 99% 99% 98% 98%  Weight:      Height:          Data Reviewed:  Basic Metabolic Panel: Recent Labs  Lab 06/14/23 2005 06/15/23 0731 06/16/23 0931 06/17/23 0904 06/18/23 0606  NA 134* 136 134* 136 136  K 3.9 3.8 3.9 3.5 3.7  CL 104 107 105 105 106  CO2 21* 24 22 20* 20*  GLUCOSE 100* 106* 96 100* 98   BUN 15 14 11 11 11   CREATININE 0.85 0.72 0.58* 0.58* 0.63  CALCIUM 8.7* 8.5* 8.1* 8.3* 8.6*  MG  --   --  1.7 1.7  --     CBC: Recent Labs  Lab 06/14/23 2005 06/15/23 0731 06/16/23 0931 06/17/23 0904 06/18/23 0606  WBC 9.0 6.7 6.6 6.9 7.0  HGB 9.3* 8.4* 8.4* 8.3* 8.4*  HCT 30.4* 27.8* 28.1* 26.3* 27.6*  MCV 81.7 82.7 81.9 80.7 81.7  PLT 430* 345 293 295 288    LFT Recent Labs  Lab 06/12/23 1413 06/14/23 2005 06/15/23 0731 06/18/23 0606  AST 18 17 16 17   ALT 19 21 20 19   ALKPHOS 69 49 47 49  BILITOT 0.4 0.7 0.6 0.9  PROT 7.0 6.7 6.0* 6.1*  ALBUMIN 3.9* 3.5 3.1* 3.0*     Antibiotics: Anti-infectives (From admission, onward)    None        DVT prophylaxis: SCDs  Code Status: Full code  Family Communication: Discussed with patient's mother at bedside   CONSULTS    Subjective   Pain well-controlled.   Objective    Physical Examination:    General-appears in no acute distress Heart-S1-S2, regular, no murmur auscultated Lungs-clear to auscultation bilaterally, no wheezing or crackles auscultated Abdomen-soft, nontender, no organomegaly Extremities-no edema  in the lower extremities Neuro-alert, oriented x3, no focal deficit noted  Status is: Inpatient:             Meredeth Ide   [facility] If 7PM-7AM, please contact night-coverage at [email or web address], Office  [phone]   06/18/2023, 9:28 AM  LOS: 4 days

## 2023-06-19 ENCOUNTER — Encounter (HOSPITAL_COMMUNITY): Payer: Self-pay | Admitting: Surgery

## 2023-06-19 ENCOUNTER — Other Ambulatory Visit: Payer: Self-pay

## 2023-06-19 DIAGNOSIS — C160 Malignant neoplasm of cardia: Secondary | ICD-10-CM | POA: Diagnosis not present

## 2023-06-19 DIAGNOSIS — D500 Iron deficiency anemia secondary to blood loss (chronic): Secondary | ICD-10-CM | POA: Diagnosis not present

## 2023-06-19 DIAGNOSIS — R627 Adult failure to thrive: Secondary | ICD-10-CM | POA: Diagnosis not present

## 2023-06-19 DIAGNOSIS — Z7189 Other specified counseling: Secondary | ICD-10-CM | POA: Diagnosis not present

## 2023-06-19 DIAGNOSIS — G893 Neoplasm related pain (acute) (chronic): Secondary | ICD-10-CM | POA: Diagnosis not present

## 2023-06-19 DIAGNOSIS — Z515 Encounter for palliative care: Secondary | ICD-10-CM | POA: Diagnosis not present

## 2023-06-19 MED ORDER — SORBITOL 70 % SOLN: 30.0000 mL | Freq: Once | Status: DC

## 2023-06-19 MED ORDER — METHOCARBAMOL 1000 MG/10ML IJ SOLN
1000.0000 mg | Freq: Four times a day (QID) | INTRAMUSCULAR | Status: DC
Start: 2023-06-19 — End: 2023-06-21
  Administered 2023-06-19 – 2023-06-21 (×9): 1000 mg via INTRAVENOUS
  Filled 2023-06-19 (×12): qty 10

## 2023-06-19 MED ORDER — SENNOSIDES 8.8 MG/5ML PO SYRP
5.0000 mL | ORAL_SOLUTION | Freq: Two times a day (BID) | ORAL | Status: DC
Start: 2023-06-19 — End: 2023-06-20
  Administered 2023-06-20: 5 mL
  Filled 2023-06-19 (×2): qty 5

## 2023-06-19 MED ORDER — SORBITOL 70 % SOLN: 30.0000 mL | Freq: Every day | Status: DC | PRN

## 2023-06-19 MED ORDER — SORBITOL 70 % SOLN
30.0000 mL | Freq: Every day | Status: DC | PRN
  Filled 2023-06-19: qty 30

## 2023-06-19 MED ORDER — SORBITOL 70 % SOLN
30.0000 mL | Freq: Once | Status: AC
  Administered 2023-06-19: 30 mL
  Filled 2023-06-19: qty 30

## 2023-06-19 MED ORDER — ACETAMINOPHEN 650 MG RE SUPP
650.0000 mg | Freq: Four times a day (QID) | RECTAL | Status: DC
  Administered 2023-06-27: 650 mg via RECTAL

## 2023-06-19 MED ORDER — ONDANSETRON HCL 4 MG PO TABS: 4.0000 mg | ORAL_TABLET | Freq: Four times a day (QID) | ORAL | Status: DC | PRN

## 2023-06-19 MED ORDER — OSMOLITE 1.5 CAL PO LIQD: 1000.0000 mL | ORAL | Status: DC

## 2023-06-19 MED ORDER — SENNOSIDES 8.8 MG/5ML PO SYRP
5.0000 mL | ORAL_SOLUTION | Freq: Two times a day (BID) | ORAL | Status: DC
  Filled 2023-06-19: qty 5

## 2023-06-19 MED ORDER — FREE WATER
30.0000 mL | Freq: Four times a day (QID) | Status: DC
  Administered 2023-06-19 – 2023-06-30 (×39): 30 mL

## 2023-06-19 MED ORDER — METHADONE HCL 10 MG/ML PO CONC
5.0000 mg | Freq: Two times a day (BID) | ORAL | Status: DC
  Administered 2023-06-19 – 2023-07-08 (×40): 5 mg
  Filled 2023-06-19 (×41): qty 5

## 2023-06-19 MED ORDER — ONDANSETRON HCL 4 MG/2ML IJ SOLN
4.0000 mg | Freq: Four times a day (QID) | INTRAMUSCULAR | Status: DC | PRN
  Administered 2023-06-20 – 2023-07-01 (×11): 4 mg via INTRAVENOUS
  Filled 2023-06-19 (×11): qty 2

## 2023-06-19 MED ORDER — ACETAMINOPHEN 160 MG/5ML PO SOLN
650.0000 mg | Freq: Four times a day (QID) | ORAL | Status: DC
  Administered 2023-06-19 – 2023-07-09 (×70): 650 mg
  Filled 2023-06-19 (×75): qty 20.3

## 2023-06-19 MED ORDER — OSMOLITE 1.5 CAL PO LIQD
1000.0000 mL | ORAL | Status: DC
Start: 2023-06-19 — End: 2023-06-28
  Administered 2023-06-19 – 2023-06-25 (×3): 1000 mL
  Filled 2023-06-19 (×17): qty 1000

## 2023-06-19 NOTE — Progress Notes (Signed)
Nutrition Follow-up  DOCUMENTATION CODES:   Non-severe (moderate) malnutrition in context of chronic illness  INTERVENTION:   -Monitor magnesium, potassium, and phosphorus for at least 3 days, MD to replete as needed, as pt is at risk for refeeding syndrome. -Recommend Thiamine 100 mg daily for at least 5 days  -Initiate Osmolite 1.5 @ 20 ml/hr, advance by 10 ml every 6 hours to goal rate of 70 ml/hr -Provides 2520 kcals, 105g protein and 1280 ml H2O -Free water currently ordered: 30 ml every 6 hours   -Ensure Plus High Protein po BID, each supplement provides 350 kcal and 20 grams of protein. As tolerated.  -Daily weights ordered  NUTRITION DIAGNOSIS:   Moderate Malnutrition related to chronic illness, cancer and cancer related treatments as evidenced by energy intake < or equal to 75% for > or equal to 1 month, mild fat depletion, percent weight loss.  Ongoing.  GOAL:   Patient will meet greater than or equal to 90% of their needs  Progressing  MONITOR:   Labs, Weight trends, I & O's, PO intake, Supplement acceptance, TF tolerance  ASSESSMENT:   47 y.o. male with medical history significant of asthma, adenocarcinoma of the gastroesophageal junction, cancer associated pain, history of GI bleed, anemia due to chronic blood loss was sent from the cancer center after he had a near syncopal episode while they were drawing multiple vials of blood.  12/13: admitted 12/17: s/p open J-tube placement  Tube feeds were started by surgery this AM. Slow advancement every 6 hours ordered. Will place orders for Mg and Phos labs in the AM to monitor for refeeding syndrome.  Will continue to follow for discharge plans.   Admission weight: 177 lbs Needs daily weights while on tube feeds. Will place order.  Medications reviewed.  Labs reviewed.   Diet Order:   Diet Order             Diet full liquid Fluid consistency: Thin  Diet effective now                   EDUCATION  NEEDS:   Education needs have been addressed  Skin:  Skin Assessment: Reviewed RN Assessment  Last BM:  12/6  Height:   Ht Readings from Last 1 Encounters:  06/15/23 5\' 6"  (1.676 m)    Weight:   Wt Readings from Last 1 Encounters:  06/15/23 80.6 kg    BMI:  Body mass index is 28.68 kg/m.  Estimated Nutritional Needs:   Kcal:  2200-2400  Protein:  105-115g  Fluid:  2.2L/day  Tilda Franco, MS, RD, LDN Inpatient Clinical Dietitian Contact via Secure chat

## 2023-06-19 NOTE — Plan of Care (Signed)
Patient progressing with tube feeding today.  Pain has been controled with hydrophone but does require frequent dosing.  Bowel regiment started to encourage BM.

## 2023-06-19 NOTE — Plan of Care (Signed)
  Problem: Education: Goal: Knowledge of General Education information will improve Description: Including pain rating scale, medication(s)/side effects and non-pharmacologic comfort measures Outcome: Progressing   Problem: Elimination: Goal: Will not experience complications related to bowel motility Outcome: Progressing   Problem: Pain Management: Goal: General experience of comfort will improve Outcome: Progressing   Problem: Safety: Goal: Ability to remain free from injury will improve Outcome: Progressing

## 2023-06-19 NOTE — Progress Notes (Signed)
Palliative:  HPI: 47 y.o. male  with past medical history of asthma, adenocarcinoma of the gastroesophageal junction, cancer associated pain, GI bleed, anemia admitted on 06/14/2023 with near syncope while drawing labs. Failure to thrive with poor intake and weight loss.   I met today with Mehran after reviewing records and events overnight. Vincenza Hews and I discussed his progression. He reports worsening pain after J-tube placement but controlled after adjustments to dilaudid last night. He is tired after lack of sleep with frequent pain. He does believe that methadone is helping some but still needing frequent dilaudid doses. We discussed giving today for J-tube pain to improve - I anticipate significant improvement in this pain over the next 1-2 days. We did discuss transition to liquid dilaudid via J-tube tomorrow knowing that the po doses will provide longer relief. Malikah is open to this and we will speak again before transition in the morning.   I discussed with Airon HCPOA/Living Will. He is working on documents and is thinking of making his mother-in-law, Tresa Endo, his primary HCPOA and eldest son his secondary HCPOA. We discussed code status and he desires full code at current time and we talk about why this may need to be reconsidering if his condition worsens in the future - he expresses understanding. He inquires about financial POA and I explain that hospital will not provide notary for non-medical paperwork.   I also discussed with Jadynn the need for laxative. He reports that he has not had BM for 1 week. He is reluctant for laxative but after discussion about danger on worsening constipation with opioid use as well as the increased pain burden this can cause he agrees.   All questions/concerns addressed. Emotional support provided.   Exam: Alert, oriented. No distress. Breathing regular, unlabored. Abd soft, flat, tender to touch. J-tube in place. Moves all extremities.   Plan: - Full code -  Epigastric stabbing, gnawing constant pain: Continue dilaudid 1 mg IV q2h PRN for now.  Dilaudid suspension 3 mg every 3 hours PRN plans to begin tomorrow.  Methadone 5 mg every 12 hours 12/18.  Could consider trial of decadron.  Consider trial of meloxicam.  Does not seem to have neuropathic component. Do not see role for muscle relaxer.  - Bowel Regimen: Sorbitol daily PRN. Given today x 1.  Senokot suspension 5 mg BID.   55 min  Yong Channel, NP Palliative Medicine Team Pager [phone] (Please see [email or web address] for schedule) Team Phone [phone]    Greater than 50%  of this time was spent counseling and coordinating care related to the above assessment and plan

## 2023-06-19 NOTE — Progress Notes (Signed)
[facility] Progress Note  1 Day Post-Op  Subjective: Reports pain overnight. No nausea or vomiting.  Objective: Vital signs in last 24 hours: Temp:  [98 F (36.7 C)-98.9 F (37.2 C)] 98.2 F (36.8 C) (12/18 0422) Pulse Rate:  [82-105] 82 (12/18 0422) Resp:  [11-16] 14 (12/18 0422) BP: (120-135)/(75-87) 120/77 (12/18 0422) SpO2:  [92 %-100 %] 95 % (12/18 0422) Last BM Date : 06/07/23  Intake/Output from previous day: 12/17 0701 - 12/18 0700 In: 318 [P.O.:118; IV Piggyback:200] Out: 955 [Urine:950; Blood:5] Intake/Output this shift: No intake/output data recorded.  PE: Gen:  Alert, NAD Pulm:  Normal work of breathing on room air Abd: Soft, nondistended, appropriately tender. Incision clean and dry, no erythema or induration. LUQ J tupe capped. Skin: warm and dry, no rashes  Psych: A&Ox3   Lab Results:  Recent Labs    06/17/23 0904 06/18/23 0606  WBC 6.9 7.0  HGB 8.3* 8.4*  HCT 26.3* 27.6*  PLT 295 288   BMET Recent Labs    06/17/23 0904 06/18/23 0606  NA 136 136  K 3.5 3.7  CL 105 106  CO2 20* 20*  GLUCOSE 100* 98  BUN 11 11  CREATININE 0.58* 0.63  CALCIUM 8.3* 8.6*   PT/INR No results for input(s): "LABPROT", "INR" in the last 72 hours. CMP     Component Value Date/Time   NA 136 06/18/2023 0606   NA 139 06/12/2023 1413   K 3.7 06/18/2023 0606   CL 106 06/18/2023 0606   CO2 20 (L) 06/18/2023 0606   GLUCOSE 98 06/18/2023 0606   BUN 11 06/18/2023 0606   BUN 14 06/12/2023 1413   CREATININE 0.63 06/18/2023 0606   CALCIUM 8.6 (L) 06/18/2023 0606   PROT 6.1 (L) 06/18/2023 0606   PROT 7.0 06/12/2023 1413   ALBUMIN 3.0 (L) 06/18/2023 0606   ALBUMIN 3.9 (L) 06/12/2023 1413   AST 17 06/18/2023 0606   ALT 19 06/18/2023 0606   ALKPHOS 49 06/18/2023 0606   BILITOT 0.9 06/18/2023 0606   BILITOT 0.4 06/12/2023 1413   GFRNONAA >60 06/18/2023 0606   GFRAA >60 08/24/2018 0750   Lipase     Component Value Date/Time   LIPASE 142 (H)  05/27/2023 1102       Studies/Results: No results found.    Assessment/Plan 47 yo male with a locally advanced GE junction cancer, with dysphagia and weight loss. POD1 s/p placement of feeding J tube on 06/18/23. - Begin feeds via J tube today, start at 69ml/hr and advance by 10ml q6h to goal. - No crushed medications are to be given via J tube, as this will clog the tube. Liquid medications only. - Ok for diet as tolerated by mouth. - TOC consulted for home feeding supplies. Medicaid application pending.   LOS: 5 days    Sophronia Simas, MD [facility] General, Hepatobiliary and Pancreatic Surgery 06/19/23 7:56 AM

## 2023-06-19 NOTE — Progress Notes (Signed)
[facility]  PROGRESS NOTE  JESUA MCHAN ONG:[id number] DOB: 1976/05/13 DOA: 06/14/2023 PCP: Ivonne Andrew, NP   Brief HPI:    47 y.o. male with medical history significant of asthma, adenocarcinoma of the gastroesophageal junction, cancer associated pain, history of GI bleed, anemia due to chronic blood loss was sent from the cancer center after he had a near syncopal episode while they were drawing multiple vials of blood.  Patient was then sent to the hospital for further evaluation and treatment.    Assessment/Plan:   Failure to thrive in adult secondary to adenocarcinoma of gastroesophageal junction (HCC) Cancer associated pain -Status post feeding jejunostomy tube placement -Currently on IV Dilaudid as needed -Started on methadone 5 mg every 12 hours per palliative care    Pre-syncope Received IV fluids.  2D echocardiogram with LV ejection fraction of 60 to 65% will monitor closely...   Anemia due to chronic GI blood loss Hemoglobin on 06/13/2021 at 8.4 from 9.3.  Will transfuse for hemoglobin less than 7.  No evidence of bleeding noted.    Asthma Continue albuterol MDI as needed.  Appears compensated at this time    Medications     acetaminophen  650 mg Oral Q6H   Or   acetaminophen  650 mg Rectal Q6H   feeding supplement  237 mL Oral TID BM   free water  30 mL Per Tube Q6H   methadone  5 mg Per Tube Q12H   methocarbamol (ROBAXIN) injection  1,000 mg Intravenous Q6H     Data Reviewed:   CBG:  No results for input(s): "GLUCAP" in the last 168 hours.  SpO2: 95 % O2 Flow Rate (L/min): 5 L/min    Vitals:   06/18/23 1730 06/18/23 1746 06/18/23 2044 06/19/23 0422  BP: 124/76 126/79 135/87 120/77  Pulse: 93 92 98 82  Resp: 11 14 16 14   Temp: 98 F (36.7 C) 98.1 F (36.7 C) 98.6 F (37 C) 98.2 F (36.8 C)  TempSrc:  Oral Oral Oral  SpO2: 92% 92% 93% 95%  Weight:      Height:          Data Reviewed:  Basic Metabolic Panel: Recent  Labs  Lab 06/14/23 2005 06/15/23 0731 06/16/23 0931 06/17/23 0904 06/18/23 0606  NA 134* 136 134* 136 136  K 3.9 3.8 3.9 3.5 3.7  CL 104 107 105 105 106  CO2 21* 24 22 20* 20*  GLUCOSE 100* 106* 96 100* 98  BUN 15 14 11 11 11   CREATININE 0.85 0.72 0.58* 0.58* 0.63  CALCIUM 8.7* 8.5* 8.1* 8.3* 8.6*  MG  --   --  1.7 1.7  --     CBC: Recent Labs  Lab 06/14/23 2005 06/15/23 0731 06/16/23 0931 06/17/23 0904 06/18/23 0606  WBC 9.0 6.7 6.6 6.9 7.0  HGB 9.3* 8.4* 8.4* 8.3* 8.4*  HCT 30.4* 27.8* 28.1* 26.3* 27.6*  MCV 81.7 82.7 81.9 80.7 81.7  PLT 430* 345 293 295 288    LFT Recent Labs  Lab 06/12/23 1413 06/14/23 2005 06/15/23 0731 06/18/23 0606  AST 18 17 16 17   ALT 19 21 20 19   ALKPHOS 69 49 47 49  BILITOT 0.4 0.7 0.6 0.9  PROT 7.0 6.7 6.0* 6.1*  ALBUMIN 3.9* 3.5 3.1* 3.0*     Antibiotics: Anti-infectives (From admission, onward)    Start     Dose/Rate Route Frequency Ordered Stop   06/18/23 1500  ceFAZolin (ANCEF) IVPB 2g/100 mL premix  2 g 200 mL/hr over 30 Minutes Intravenous On call to O.R. 06/18/23 1446 06/18/23 1600        DVT prophylaxis: SCDs  Code Status: Full code  Family Communication: Discussed with patient's mother at bedside   CONSULTS    Subjective   Status post placement of feeding J-tube.  Complains of pain    Objective    Physical Examination:  General-appears in no acute distress Heart-S1-S2, regular, no murmur auscultated Lungs-clear to auscultation bilaterally, no wheezing or crackles auscultated Abdomen-soft, nontender, no organomegaly Extremities-no edema in the lower extremities Neuro-alert, oriented x3, no focal deficit noted  Status is: Inpatient:         Meredeth Ide   [facility] If 7PM-7AM, please contact night-coverage at [email or web address], Office  [phone]   06/19/2023, 9:34 AM  LOS: 5 days

## 2023-06-20 DIAGNOSIS — C160 Malignant neoplasm of cardia: Secondary | ICD-10-CM | POA: Diagnosis not present

## 2023-06-20 DIAGNOSIS — Z515 Encounter for palliative care: Secondary | ICD-10-CM | POA: Diagnosis not present

## 2023-06-20 DIAGNOSIS — Z7189 Other specified counseling: Secondary | ICD-10-CM | POA: Diagnosis not present

## 2023-06-20 DIAGNOSIS — G893 Neoplasm related pain (acute) (chronic): Secondary | ICD-10-CM | POA: Diagnosis not present

## 2023-06-20 DIAGNOSIS — R627 Adult failure to thrive: Secondary | ICD-10-CM | POA: Diagnosis not present

## 2023-06-20 DIAGNOSIS — D500 Iron deficiency anemia secondary to blood loss (chronic): Secondary | ICD-10-CM | POA: Diagnosis not present

## 2023-06-20 LAB — COMPREHENSIVE METABOLIC PANEL
ALT: 20 U/L (ref 0–44)
AST: 18 U/L (ref 15–41)
Albumin: 3.2 g/dL — ABNORMAL LOW (ref 3.5–5.0)
Alkaline Phosphatase: 49 U/L (ref 38–126)
Anion gap: 9 (ref 5–15)
BUN: 21 mg/dL — ABNORMAL HIGH (ref 6–20)
CO2: 22 mmol/L (ref 22–32)
Calcium: 8.6 mg/dL — ABNORMAL LOW (ref 8.9–10.3)
Chloride: 107 mmol/L (ref 98–111)
Creatinine, Ser: 0.53 mg/dL — ABNORMAL LOW (ref 0.61–1.24)
GFR, Estimated: >60 mL/min (ref >60)
Glucose, Bld: 113 mg/dL — ABNORMAL HIGH (ref 70–99)
Potassium: 3.3 mmol/L — ABNORMAL LOW (ref 3.5–5.1)
Sodium: 138 mmol/L (ref 135–145)
Total Bilirubin: 0.8 mg/dL (ref <1.2)
Total Protein: 6.5 g/dL (ref 6.5–8.1)

## 2023-06-20 LAB — CBC
HCT: 28.7 % — ABNORMAL LOW (ref 39.0–52.0)
Hemoglobin: 8.7 g/dL — ABNORMAL LOW (ref 13.0–17.0)
MCH: 24.5 pg — ABNORMAL LOW (ref 26.0–34.0)
MCHC: 30.3 g/dL (ref 30.0–36.0)
MCV: 80.8 fL (ref 80.0–100.0)
Platelets: 312 10*3/uL (ref 150–400)
RBC: 3.55 MIL/uL — ABNORMAL LOW (ref 4.22–5.81)
RDW: 14.4 % (ref 11.5–15.5)
WBC: 9.9 10*3/uL (ref 4.0–10.5)
nRBC: 0 % (ref 0.0–0.2)

## 2023-06-20 LAB — PHOSPHORUS: Phosphorus: 2.7 mg/dL (ref 2.5–4.6)

## 2023-06-20 LAB — MAGNESIUM: Magnesium: 2 mg/dL (ref 1.7–2.4)

## 2023-06-20 MED ORDER — DEXAMETHASONE SODIUM PHOSPHATE 4 MG/ML IJ SOLN
4.0000 mg | Freq: Every day | INTRAMUSCULAR | Status: DC
  Administered 2023-06-20 – 2023-06-30 (×11): 4 mg via INTRAVENOUS
  Filled 2023-06-20 (×11): qty 1

## 2023-06-20 MED ORDER — MAGNESIUM CITRATE PO SOLN
0.5000 | Freq: Once | ORAL | Status: AC
  Administered 2023-06-20: 0.5
  Filled 2023-06-20: qty 296

## 2023-06-20 MED ORDER — SENNOSIDES 8.8 MG/5ML PO SYRP
5.0000 mL | ORAL_SOLUTION | Freq: Two times a day (BID) | ORAL | Status: DC
  Administered 2023-06-22 – 2023-06-27 (×8): 5 mL
  Filled 2023-06-20 (×19): qty 5

## 2023-06-20 MED ORDER — METHYLNALTREXONE BROMIDE 12 MG/0.6ML ~~LOC~~ SOLN
12.0000 mg | Freq: Once | SUBCUTANEOUS | Status: AC
  Administered 2023-06-20: 12 mg via SUBCUTANEOUS
  Filled 2023-06-20: qty 0.6

## 2023-06-20 MED ORDER — POTASSIUM CHLORIDE 20 MEQ PO PACK
40.0000 meq | PACK | Freq: Once | ORAL | Status: AC
  Administered 2023-06-20: 40 meq via ORAL
  Filled 2023-06-20: qty 2

## 2023-06-20 MED ORDER — DIAZEPAM 1 MG/ML PO SOLN: 5.0000 mg | Freq: Four times a day (QID) | ORAL | Status: DC | PRN

## 2023-06-20 MED ORDER — KETOROLAC TROMETHAMINE 15 MG/ML IJ SOLN
15.0000 mg | Freq: Three times a day (TID) | INTRAMUSCULAR | Status: AC
  Administered 2023-06-20 – 2023-06-25 (×15): 15 mg via INTRAVENOUS
  Filled 2023-06-20 (×16): qty 1

## 2023-06-20 MED ORDER — PROCHLORPERAZINE EDISYLATE 10 MG/2ML IJ SOLN
5.0000 mg | Freq: Four times a day (QID) | INTRAMUSCULAR | Status: DC | PRN
  Administered 2023-06-20 – 2023-06-26 (×6): 5 mg via INTRAVENOUS
  Filled 2023-06-20 (×6): qty 2

## 2023-06-20 NOTE — Progress Notes (Signed)
[facility] Progress Note  2 Days Post-Op  Subjective: Patient reports pain and nausea overnight, says he has gagging when he has pain. Tube feeds were stopped per patient request. Reports he has passed some flatus but no BM. Has not been mobilizing.  Objective: Vital signs in last 24 hours: Temp:  [97.9 F (36.6 C)-98.3 F (36.8 C)] 98 F (36.7 C) (12/19 0703) Pulse Rate:  [73-84] 84 (12/19 0703) Resp:  [15-20] 20 (12/19 0703) BP: (129-138)/(76-85) 138/85 (12/19 0703) SpO2:  [94 %-96 %] 96 % (12/19 0703) Last BM Date : 06/07/23  Intake/Output from previous day: 12/18 0701 - 12/19 0700 In: 377.7 [NG/GT:377.7] Out: 750 [Urine:750] Intake/Output this shift: No intake/output data recorded.  PE: Gen:  Alert, NAD Pulm:  Normal work of breathing on room air Abd: Soft, nondistended, appropriately tender. Incision clean and dry, no erythema or induration. LUQ J tupe capped. Skin: warm and dry, no rashes  Psych: A&Ox3   Lab Results:  Recent Labs    06/18/23 0606 06/20/23 0649  WBC 7.0 9.9  HGB 8.4* 8.7*  HCT 27.6* 28.7*  PLT 288 312   BMET Recent Labs    06/18/23 0606 06/20/23 0649  NA 136 138  K 3.7 3.3*  CL 106 107  CO2 20* 22  GLUCOSE 98 113*  BUN 11 21*  CREATININE 0.63 0.53*  CALCIUM 8.6* 8.6*   PT/INR No results for input(s): "LABPROT", "INR" in the last 72 hours. CMP     Component Value Date/Time   NA 138 06/20/2023 0649   NA 139 06/12/2023 1413   K 3.3 (L) 06/20/2023 0649   CL 107 06/20/2023 0649   CO2 22 06/20/2023 0649   GLUCOSE 113 (H) 06/20/2023 0649   BUN 21 (H) 06/20/2023 0649   BUN 14 06/12/2023 1413   CREATININE 0.53 (L) 06/20/2023 0649   CALCIUM 8.6 (L) 06/20/2023 0649   PROT 6.5 06/20/2023 0649   PROT 7.0 06/12/2023 1413   ALBUMIN 3.2 (L) 06/20/2023 0649   ALBUMIN 3.9 (L) 06/12/2023 1413   AST 18 06/20/2023 0649   ALT 20 06/20/2023 0649   ALKPHOS 49 06/20/2023 0649   BILITOT 0.8 06/20/2023 0649   BILITOT 0.4  06/12/2023 1413   GFRNONAA >60 06/20/2023 0649   GFRAA >60 08/24/2018 0750   Lipase     Component Value Date/Time   LIPASE 142 (H) 05/27/2023 1102       Studies/Results: No results found.    Assessment/Plan 47 yo male with a locally advanced GE junction cancer, with dysphagia and weight loss. POD2 s/p placement of feeding J tube on 06/18/23. - Resume J tube feeds at 12ml/hr today. Will advance slowly. - No crushed medications are to be given via J tube, as this will clog the tube. Liquid medications only. - Appreciate palliative recommendations for pain control. On dilaudid and methadone per tube. - Full liquids as tolerated by mouth. - Encouraged patient to mobilize today. PT ordered. - TOC consulted for home feeding supplies. Medicaid application pending.   LOS: 6 days    Sophronia Simas, MD [facility] Surgery General, Hepatobiliary and Pancreatic Surgery 06/20/23 8:23 AM

## 2023-06-20 NOTE — TOC Progression Note (Signed)
Transition of Care [employer]) - Progression Note    Patient Details  Name: Jesus Haynes MRN: [id number] Date of Birth: 08-24-1975  Transition [facility]) CM/SW Contact  Beckie Busing, RN Phone Number:[phone]  06/20/2023, 8:45 AM  Clinical Narrative:    Message has been left for Mitch with [employer] in reference to obtaining letter of guarantee for hospital to pay for 30 day supply of tube feeds and supplies. Will await return call.        Expected Discharge Plan and Services                                               Social Determinants of Health (SDOH) Interventions SDOH Screenings   Food Insecurity: No Food Insecurity (06/14/2023)  Housing: Low Risk  (06/14/2023)  Transportation Needs: No Transportation Needs (06/14/2023)  Utilities: Not At Risk (06/14/2023)  Depression (PHQ2-9): Low Risk  (06/12/2023)  Tobacco Use: Medium Risk (06/18/2023)    Readmission Risk Interventions     No data to display

## 2023-06-20 NOTE — Plan of Care (Signed)
  Problem: Education: Goal: Knowledge of General Education information will improve Description: Including pain rating scale, medication(s)/side effects and non-pharmacologic comfort measures Outcome: Progressing   Problem: Health Behavior/Discharge Planning: Goal: Ability to manage health-related needs will improve Outcome: Progressing   Problem: Clinical Measurements: Goal: Ability to maintain clinical measurements within normal limits will improve Outcome: Progressing Goal: Will remain free from infection Outcome: Progressing Goal: Diagnostic test results will improve Outcome: Progressing Goal: Respiratory complications will improve Outcome: Progressing Goal: Cardiovascular complication will be avoided Outcome: Progressing   Problem: Activity: Goal: Risk for activity intolerance will decrease Outcome: Progressing   Problem: Coping: Goal: Level of anxiety will decrease Outcome: Progressing   Problem: Elimination: Goal: Will not experience complications related to urinary retention Outcome: Progressing   Problem: Pain Management: Goal: General experience of comfort will improve Outcome: Progressing   Problem: Safety: Goal: Ability to remain free from injury will improve Outcome: Progressing   Problem: Skin Integrity: Goal: Risk for impaired skin integrity will decrease Outcome: Progressing

## 2023-06-20 NOTE — Progress Notes (Signed)
   06/20/23 1400  Spiritual Encounters  Type of Visit Initial  Care provided to: Pt and family  Referral source Patient request  Reason for visit Advance directives  OnCall Visit Yes   Chaplain visited and discussed AD paperwork with patient and family.  Patient decided to wait and pursue at a later date. Chaplain spiritual support services remain available as the need arises.

## 2023-06-20 NOTE — Progress Notes (Signed)
Patient refused infusion of tube feedings due to "feeling full." Encouraged patient to try feedings at a slow rate and still refused.

## 2023-06-20 NOTE — Progress Notes (Signed)
Palliative:  HPI: 47 y.o. male with past medical history of asthma, adenocarcinoma of the gastroesophageal junction, cancer associated pain, GI bleed, anemia admitted on 06/14/2023 with near syncope while drawing labs. Failure to thrive with poor intake and weight loss.   I met with Matthew this morning and he is having severe pain episode and is refusing anything through J-tube. I spoke with Svend about having IV dilaudid dose now and allowing [place] to give methadone and mag citrate because these will ultimately add to improved pain management. He agrees. He expresses frustration at all providers coming in and trying to discuss with him while he is in pain crisis. I was able to speak with him about plan to manage acute pain and plan to revisit to discuss further once pain better controlled. He agrees.   Discussed with nursing staff and nursing management concerns overnight.   I returned to bedside and Nang is resting comfortably. He has not had a BM. He reports he currently has no pain. Recently received IV dilaudid but went 4 hours between last PRN dose. He is resting after difficult night and morning.   I called and spoke with main support and who Stafford designates his HCPOA (although not yet documented). Tresa Endo and I review Emran's progress. Tresa Endo expresses concern with Nocholas's fear of return of severe pain and condition this morning. I discussed with her my visit with Sandeep and adjustment to his regimen. She agrees with plan. Tresa Endo asks very good questions about the "what ifs" such as if he is unable to tolerate tube feeding or unable to tolerate off IV medication. She fears that his gut may not work well and we do review that his constipation if not relieved could escalate to a more serious complication that could ultimately be life threatening. We did discuss the role of hospice if Vandy were to decide to focus more on comfort and in certain scenarios. Tresa Endo wants to support Daren's wishes. We agree to  continue to try and optimize to see if we can alleviate constipation, tolerate tube feeding, and transition to pain regimen that can be continued outside hospital. We agreed that if we go through the weekend without any progress or with worsening we need to revisit discussion.   Tresa Endo asks about Rapheal's youngest child coming to visit and I believe this is reasonable but to check with RN to make sure he is not in pain crisis and it is a good time prior to visit. Tresa Endo shares that they have connected with school counselors who are providing his children counseling services. Tresa Endo shares that she had a conversation with rest of family to encourage them to spend time and support Marrell but to allow palliative care and medication team and nurses to advocate to get him what he needs. Tresa Endo is also trying to prepare family for poor prognosis. Tresa Endo returns to work tomorrow and cannot always immediately answer phone but will return call if she misses it - available by phone 2:15-3:15 pm. She would like to remain updated.   All questions/concerns addressed. Emotional support provided. Discussed with Dr. Sharl Ma, RN staff, TOC. Discussed with my colleague Dr. Patterson Hammersmith who will take over care tomorrow.   Exam: Alert, oriented. Resting comfortably during my second visit. Breathing regular, unlabored. Abd soft, flat, tender. Moves all extremities.   Plan: - Full code - Epigastric stabbing, gnawing constant pain: Continue dilaudid 1 mg IV q2h PRN for now.  Consider transition to Dilaudid suspension 3 mg every 3 hours  PRN.  Methadone 5 mg every 12 hours beginning 12/18.  Decadron 4 mg IV daily.  Toradol IV scheduled.  Robaxin every 6 hours per surgery.  Consider celiac plexus block.  - Bowel Regimen: LBM PTA Sorbitol daily PRN.   Senokot suspension 5 mg BID.  Mag citrate 0.5 bottle x 1. Relistor to be given today. May repeat tomorrow if no BM.   80 min  Yong Channel, NP Palliative Medicine Team Pager  [phone] (Please see [email or web address] for schedule) Team Phone [phone]    Greater than 50%  of this time was spent counseling and coordinating care related to the above assessment and plan

## 2023-06-20 NOTE — Progress Notes (Signed)
[facility]  PROGRESS NOTE  Jesus Haynes:[id number] DOB: [birth year] DOA: 06/14/2023 PCP: Ivonne Andrew, NP   Brief HPI:    47 y.o. male with medical history significant of asthma, adenocarcinoma of the gastroesophageal junction, cancer associated pain, history of GI bleed, anemia due to chronic blood loss was sent from the cancer center after he had a near syncopal episode while they were drawing multiple vials of blood.  Patient was then sent to the hospital for further evaluation and treatment.    Assessment/Plan:   Failure to thrive in adult secondary to adenocarcinoma of gastroesophageal junction (HCC) Cancer associated pain -Status post feeding jejunostomy tube placement -Currently on IV Dilaudid as needed -Started on methadone 5 mg every 12 hours per palliative care -Patient has been refusing methadone and only getting IV Dilaudid -Palliative care nurse practitioner in the room, discussed with patient that we need to wean off IV Dilaudid and he has to try taking methadone. -Patient became irritable and angry, palliative care NP managing oral medications.    Pre-syncope Received IV fluids.  2D echocardiogram with LV ejection fraction of 60 to 65% will monitor closely...   Anemia due to chronic GI blood loss Hemoglobin on 06/13/2021 at 8.4 from 9.3.  Will transfuse for hemoglobin less than 7.  No evidence of bleeding noted.    Asthma Continue albuterol MDI as needed.  Appears compensated at this time  Hypokalemia -Replace potassium and follow BMP in am  Medications     acetaminophen  650 mg Rectal Q6H   Or   acetaminophen (TYLENOL) oral liquid 160 mg/5 mL  650 mg Per Tube Q6H   feeding supplement  237 mL Oral TID BM   free water  30 mL Per Tube Q6H   magnesium citrate  0.5 Bottle Per Tube Once   methadone  5 mg Per Tube Q12H   methocarbamol (ROBAXIN) injection  1,000 mg Intravenous Q6H   sennosides  5 mL Per Tube BID     Data Reviewed:    CBG:  No results for input(s): "GLUCAP" in the last 168 hours.  SpO2: 96 % O2 Flow Rate (L/min): 5 L/min    Vitals:   06/19/23 0422 06/19/23 1417 06/19/23 2037 06/20/23 0703  BP: 120/77 133/76 129/81 138/85  Pulse: 82 73 74 84  Resp: 14 15 20 20   Temp: 98.2 F (36.8 C) 98.3 F (36.8 C) 97.9 F (36.6 C) 98 F (36.7 C)  TempSrc: Oral Oral Oral Oral  SpO2: 95% 94% 96% 96%  Weight:      Height:          Data Reviewed:  Basic Metabolic Panel: Recent Labs  Lab 06/15/23 0731 06/16/23 0931 06/17/23 0904 06/18/23 0606 06/20/23 0649  NA 136 134* 136 136 138  K 3.8 3.9 3.5 3.7 3.3*  CL 107 105 105 106 107  CO2 24 22 20* 20* 22  GLUCOSE 106* 96 100* 98 113*  BUN 14 11 11 11  21*  CREATININE 0.72 0.58* 0.58* 0.63 0.53*  CALCIUM 8.5* 8.1* 8.3* 8.6* 8.6*  MG  --  1.7 1.7  --  2.0  PHOS  --   --   --   --  2.7    CBC: Recent Labs  Lab 06/15/23 0731 06/16/23 0931 06/17/23 0904 06/18/23 0606 06/20/23 0649  WBC 6.7 6.6 6.9 7.0 9.9  HGB 8.4* 8.4* 8.3* 8.4* 8.7*  HCT 27.8* 28.1* 26.3* 27.6* 28.7*  MCV 82.7 81.9 80.7 81.7 80.8  PLT  345 293 295 288 312    LFT Recent Labs  Lab 06/14/23 2005 06/15/23 0731 06/18/23 0606 06/20/23 0649  AST 17 16 17 18   ALT 21 20 19 20   ALKPHOS 49 47 49 49  BILITOT 0.7 0.6 0.9 0.8  PROT 6.7 6.0* 6.1* 6.5  ALBUMIN 3.5 3.1* 3.0* 3.2*     Antibiotics: Anti-infectives (From admission, onward)    Start     Dose/Rate Route Frequency Ordered Stop   06/18/23 1500  ceFAZolin (ANCEF) IVPB 2g/100 mL premix        2 g 200 mL/hr over 30 Minutes Intravenous On call to O.R. 06/18/23 1446 06/18/23 1600        DVT prophylaxis: SCDs  Code Status: Full code  Family Communication: Discussed with patient's mother at bedside   CONSULTS    Subjective    Continues to complain of pain and getting IV Dilaudid every 2 hours.   Objective    Physical Examination:  Alert, oriented x 3, Appears irritable   Status is:  Inpatient:         Meredeth Ide   [facility] If 7PM-7AM, please contact night-coverage at [email or web address], Office  [phone]   06/20/2023, 10:16 AM  LOS: 6 days

## 2023-06-21 ENCOUNTER — Inpatient Hospital Stay (HOSPITAL_COMMUNITY): Payer: Medicaid Other

## 2023-06-21 DIAGNOSIS — Z515 Encounter for palliative care: Secondary | ICD-10-CM

## 2023-06-21 DIAGNOSIS — D500 Iron deficiency anemia secondary to blood loss (chronic): Secondary | ICD-10-CM | POA: Diagnosis not present

## 2023-06-21 DIAGNOSIS — Z79899 Other long term (current) drug therapy: Secondary | ICD-10-CM

## 2023-06-21 DIAGNOSIS — R627 Adult failure to thrive: Secondary | ICD-10-CM | POA: Diagnosis not present

## 2023-06-21 DIAGNOSIS — C160 Malignant neoplasm of cardia: Secondary | ICD-10-CM | POA: Diagnosis not present

## 2023-06-21 DIAGNOSIS — G893 Neoplasm related pain (acute) (chronic): Secondary | ICD-10-CM | POA: Diagnosis not present

## 2023-06-21 LAB — BASIC METABOLIC PANEL
Anion gap: 8 (ref 5–15)
BUN: 33 mg/dL — ABNORMAL HIGH (ref 6–20)
CO2: 24 mmol/L (ref 22–32)
Calcium: 8.6 mg/dL — ABNORMAL LOW (ref 8.9–10.3)
Chloride: 105 mmol/L (ref 98–111)
Creatinine, Ser: 0.71 mg/dL (ref 0.61–1.24)
GFR, Estimated: >60 mL/min (ref >60)
Glucose, Bld: 135 mg/dL — ABNORMAL HIGH (ref 70–99)
Potassium: 3.8 mmol/L (ref 3.5–5.1)
Sodium: 137 mmol/L (ref 135–145)

## 2023-06-21 LAB — PHOSPHORUS: Phosphorus: 3.4 mg/dL (ref 2.5–4.6)

## 2023-06-21 LAB — MAGNESIUM: Magnesium: 2.4 mg/dL (ref 1.7–2.4)

## 2023-06-21 MED ORDER — HYDROMORPHONE HCL 1 MG/ML PO LIQD
2.5000 mg | ORAL | Status: DC | PRN
  Administered 2023-06-21 – 2023-07-11 (×14): 2.5 mg via ORAL
  Filled 2023-06-21 (×15): qty 3

## 2023-06-21 MED ORDER — METHOCARBAMOL 1000 MG/10ML IJ SOLN
1000.0000 mg | Freq: Three times a day (TID) | INTRAMUSCULAR | Status: DC
Start: 2023-06-21 — End: 2023-06-22
  Administered 2023-06-21 – 2023-06-22 (×2): 1000 mg via INTRAVENOUS
  Filled 2023-06-21 (×3): qty 10

## 2023-06-21 MED ORDER — HYDROMORPHONE HCL 1 MG/ML IJ SOLN: 1.0000 mg | INTRAMUSCULAR | Status: DC | PRN

## 2023-06-21 MED ORDER — HYDROMORPHONE HCL 1 MG/ML IJ SOLN
1.0000 mg | INTRAMUSCULAR | Status: DC | PRN
  Administered 2023-06-22 – 2023-07-10 (×18): 1 mg via INTRAVENOUS
  Filled 2023-06-21 (×18): qty 1

## 2023-06-21 MED ORDER — HYDROMORPHONE HCL 1 MG/ML PO LIQD: 2.5000 mg | ORAL | Status: DC | PRN

## 2023-06-21 NOTE — Progress Notes (Signed)
Pt has refused to start tube feeds stating "maybe later today"

## 2023-06-21 NOTE — Progress Notes (Signed)
Daily Progress Note   Patient Name: Jesus Haynes       Date: 06/21/2023 DOB: 09-Mar-1976  Age: 47 y.o. MRN#: [id number] Attending Physician: Meredeth Ide, MD Primary Care Physician: Ivonne Andrew, NP Admit Date: 06/14/2023 Length of Stay: 7 days  Reason for Consultation/Follow-up: Pain control  Subjective:   CC: Patient notes pain continues to improve.  Following up regarding pain management.  Subjective:  Reviewed EMR prior to presenting to bedside.  Patient continues to receive scheduled Toradol x 5 days, dexamethasone 4 mg daily, and methadone 5 mg every 12 hours.  Patient also receiving scheduled Robaxin 1000 mg every 6 hours.  Patient has required IV Dilaudid 1 mg every 2 hours as needed x 6 doses in the past 24 hours.  Presented to bedside to meet with patient.  Patient laying in bed.  At time of initial visit patient noted to be needed to urinate and so noted this provider would follow-up later in the day. Presented later in the afternoon to bedside.  RN present at bedside with patient.  Patient's mother-in-law also present.  Able to introduce myself as a member of the palliative medicine team.   Discussed patient's symptom burden at this time.  Patient feels that his pain management continues to improve.  Discussed transition to oral Dilaudid today from IV to make dose adjustments since patient will only be able to continue on oral Dilaudid in the outpatient setting for breakthrough pain management.  Discussed if patient needing frequent breakthrough, may need to consider adjustment of methadone dosing further.  Discussed that methadone should be assisting with pain management to given more time.  Discussed that IV Dilaudid should only be used as breakthrough to oral dilaudid dosing. Patient acknowledged this.Also discussed Toradol will only be used for 5 for 5 days to minimize GI bleed risk. And noted would wean robaxin as patient describing visceral pain, not somatic.  With  regards to constipation, patient has not had a BM in many days. Discussed importance of regular bowel movements while receiving opioids and as constipation would worsen pain and discomfort related to Tfs.. Noted would discuss with hospitalist to obtain abx Cray to monitor for signs of obstruction. If not signs of obstruction, would recommend consideration of enema.   Spent time answering questions as able. Noted PMT would continue to follow along with patient's medical journey.   Objective:   Vital Signs:  BP 117/68 (BP Location: Left Arm)   Pulse 90   Temp 97.8 F (36.6 C) (Oral)   Resp 16   Ht 5\' 6"  (1.676 m)   Wt 80.6 kg   SpO2 94%   BMI 28.68 kg/m   Physical Exam: General: NAD, laying in bed, chronically ill-appearing Cardiovascular: RRR Respiratory: no increased work of breathing noted, not in respiratory distress Neuro: A&Ox4, following commands easily Psych: appropriately answers all questions  Imaging: I personally reviewed recent imaging.   Assessment & Plan:   Assessment: Patient is a 47 year old male with a past medical history of asthma and adenocarcinoma of the gastroesophageal junction who was admitted on 06/14/2023 for management of near syncope while drawing labs.  Patient has suffered from failure to thrive with poor oral intake and weight loss.  Patient has suffered from associated cancer pain and GI bleed.  Palliative medicine team consulted to assist pain management.  Recommendations/Plan: # Complex medical decision making/goals of care:  - Continuing appropriate medical management at this time.  -Has been recommended to patient to complete  ACP documentation regarding HCPOA.  -  Code Status: Full Code  # Symptom management:  -Pain, acute on chronic in setting of adenocarcinoma of the GE junction   -Continue methadone 5 mg every 12 hours which was started on 12/18   -Change p.o. Dilaudid liquid to 2.5 mg every 3 hours as needed   -Change IV Dilaudid to 1  mg every 2 hours as needed for breakthrough pain only after oral opioid medication   -Continue dexamethasone 4 mg IV daily   -Continue IV Toradol scheduled x 5 days   -Decrease scheduling of Robaxin to every 8 hours as would attempt to wean since patient's pain related to visceral instead of somatic pain   -May need to consider celiac plexus block in the future   -Constipation Patient has not had a bowel movement in multiple days.  Discussed importance of management of constipation with patient as can worsen abdominal pain.   -Obtain abdominal x-ray to monitor for blockage.  Discussed with hospitalist who will order.   -Continue senna suspension 5 mg twice daily   -Received mag citrate already on 06/20/2023   -Received relistor store 12 mg x 1 on 06/20/2023   -Discussed with patient consideration of enema today based on imaging  # Psychosocial Support:  -Mother-in-law, Jesus Haynes  # Discharge Planning: To Be Determined  Discussed with: Hospitalist, patient, RN, patient's mother-in-law at bedside  Thank you for allowing the palliative care team to participate in the care Jesus Haynes.  Alvester Morin, DO Palliative Care Provider PMT # [phone]  If patient remains symptomatic despite maximum doses, please call PMT at [phone] between 0700 and 1900. Outside of these hours, please call attending, as PMT does not have night coverage.  *Please note that this is a verbal dictation therefore any spelling or grammatical errors are due to the "[employer] One" system interpretation.

## 2023-06-21 NOTE — Plan of Care (Signed)

## 2023-06-21 NOTE — Plan of Care (Signed)
  Problem: Education: Goal: Knowledge of General Education information will improve Description: Including pain rating scale, medication(s)/side effects and non-pharmacologic comfort measures Outcome: Progressing   Problem: Health Behavior/Discharge Planning: Goal: Ability to manage health-related needs will improve Outcome: Progressing   Problem: Clinical Measurements: Goal: Ability to maintain clinical measurements within normal limits will improve Outcome: Progressing Goal: Will remain free from infection Outcome: Progressing Goal: Diagnostic test results will improve Outcome: Progressing Goal: Respiratory complications will improve Outcome: Progressing Goal: Cardiovascular complication will be avoided Outcome: Progressing   Problem: Pain Management: Goal: General experience of comfort will improve Outcome: Progressing   Problem: Safety: Goal: Ability to remain free from injury will improve Outcome: Progressing   Problem: Skin Integrity: Goal: Risk for impaired skin integrity will decrease Outcome: Progressing

## 2023-06-21 NOTE — Progress Notes (Signed)
[facility]  PROGRESS NOTE  Jesus Haynes XBM:[id number] DOB: 1975/08/26 DOA: 06/14/2023 PCP: Ivonne Andrew, NP   Brief HPI:    47 y.o. male with medical history significant of asthma, adenocarcinoma of the gastroesophageal junction, cancer associated pain, history of GI bleed, anemia due to chronic blood loss was sent from the cancer center after he had a near syncopal episode while they were drawing multiple vials of blood.  Patient was then sent to the hospital for further evaluation and treatment.    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 -Agrees to take methadone now. -Did not try tube feed yesterday due to pain.  Agrees to try tube feed today.    Pre-syncope Received IV fluids.  2D echocardiogram with LV ejection fraction of 60 to 65% will monitor closely...   Anemia due to chronic GI blood loss Hemoglobin on 06/13/2021 at 8.4 from 9.3.  Will transfuse for hemoglobin less than 7.  No evidence of bleeding noted.    Asthma Continue albuterol MDI as needed.  Appears compensated at this time  Hypokalemia -Replete  Medications     acetaminophen  650 mg Rectal Q6H   Or   acetaminophen (TYLENOL) oral liquid 160 mg/5 mL  650 mg Per Tube Q6H   dexamethasone (DECADRON) injection  4 mg Intravenous Daily   feeding supplement  237 mL Oral TID BM   free water  30 mL Per Tube Q6H   ketorolac  15 mg Intravenous Q8H   methadone  5 mg Per Tube Q12H   methocarbamol (ROBAXIN) injection  1,000 mg Intravenous Q6H   sennosides  5 mL Per Tube BID     Data Reviewed:    CBG:  No results for input(s): "GLUCAP" in the last 168 hours.  SpO2: 96 % O2 Flow Rate (L/min): 5 L/min    Vitals:   06/20/23 1319 06/20/23 1620 06/20/23 2122 06/21/23 0616  BP: 129/85  129/75 113/77  Pulse: 85  89 85  Resp: 18  18 18   Temp: 98 F (36.7 C)  98.3 F (36.8 C) 98.1 F (36.7 C)  TempSrc: Oral  Oral Oral  SpO2: 95% 96% 95% 96%  Weight:      Height:          Data Reviewed:  Basic Metabolic Panel: Recent Labs  Lab 06/16/23 0931 06/17/23 0904 06/18/23 0606 06/20/23 0649 06/21/23 0541  NA 134* 136 136 138 137  K 3.9 3.5 3.7 3.3* 3.8  CL 105 105 106 107 105  CO2 22 20* 20* 22 24  GLUCOSE 96 100* 98 113* 135*  BUN 11 11 11  21* 33*  CREATININE 0.58* 0.58* 0.63 0.53* 0.71  CALCIUM 8.1* 8.3* 8.6* 8.6* 8.6*  MG 1.7 1.7  --  2.0 2.4  PHOS  --   --   --  2.7 3.4    CBC: Recent Labs  Lab 06/15/23 0731 06/16/23 0931 06/17/23 0904 06/18/23 0606 06/20/23 0649  WBC 6.7 6.6 6.9 7.0 9.9  HGB 8.4* 8.4* 8.3* 8.4* 8.7*  HCT 27.8* 28.1* 26.3* 27.6* 28.7*  MCV 82.7 81.9 80.7 81.7 80.8  PLT 345  293 295 288 312    LFT Recent Labs  Lab 06/14/23 2005 06/15/23 0731 06/18/23 0606 06/20/23 0649  AST 17 16 17 18   ALT 21 20 19 20   ALKPHOS 49 47 49 49  BILITOT 0.7 0.6 0.9 0.8  PROT 6.7 6.0* 6.1* 6.5  ALBUMIN 3.5 3.1* 3.0* 3.2*     Antibiotics: Anti-infectives (From admission, onward)    Start     Dose/Rate Route Frequency Ordered Stop   06/18/23 1500  ceFAZolin (ANCEF) IVPB 2g/100 mL premix        2 g 200 mL/hr over 30 Minutes Intravenous On call to O.R. 06/18/23 1446 06/18/23 1600        DVT prophylaxis: SCDs  Code Status: Full code  Family Communication: Discussed with patient's mother at bedside   CONSULTS    Subjective   Pain is better controlled after starting methadone.  Objective    Physical Examination:  General-appears in no acute distress Heart-S1-S2, regular, no murmur auscultated Lungs-clear to auscultation  bilaterally, no wheezing or crackles auscultated Abdomen-soft, nontender, no organomegaly Extremities-no edema in the lower extremities Neuro-alert, oriented x3, no focal deficit noted  Status is: Inpatient:         Meredeth Ide   [facility] If 7PM-7AM, please contact night-coverage at [email or web address], Office  [phone]   06/21/2023, 7:57 AM  LOS: 7 days

## 2023-06-21 NOTE — Progress Notes (Signed)
[facility] Progress Note  3 Days Post-Op  Subjective: Patient says pain is better today. He refused tube feeds yesterday due to pain. Denies nausea/vomiting.  Objective: Vital signs in last 24 hours: Temp:  [98 F (36.7 C)-98.3 F (36.8 C)] 98.1 F (36.7 C) (12/20 0616) Pulse Rate:  [85-89] 85 (12/20 0616) Resp:  [18] 18 (12/20 0616) BP: (113-129)/(75-85) 113/77 (12/20 0616) SpO2:  [95 %-96 %] 96 % (12/20 0616) Last BM Date : 06/07/23  Intake/Output from previous day: No intake/output data recorded. Intake/Output this shift: No intake/output data recorded.  PE: Gen:  Alert, NAD Pulm:  Normal work of breathing on room air Abd: Soft, nondistended, appropriately tender. Incision clean and dry, no erythema or induration. LUQ J tupe capped. Skin: warm and dry, no rashes  Psych: A&Ox3   Lab Results:  Recent Labs    06/20/23 0649  WBC 9.9  HGB 8.7*  HCT 28.7*  PLT 312   BMET Recent Labs    06/20/23 0649 06/21/23 0541  NA 138 137  K 3.3* 3.8  CL 107 105  CO2 22 24  GLUCOSE 113* 135*  BUN 21* 33*  CREATININE 0.53* 0.71  CALCIUM 8.6* 8.6*   PT/INR No results for input(s): "LABPROT", "INR" in the last 72 hours. CMP     Component Value Date/Time   NA 137 06/21/2023 0541   NA 139 06/12/2023 1413   K 3.8 06/21/2023 0541   CL 105 06/21/2023 0541   CO2 24 06/21/2023 0541   GLUCOSE 135 (H) 06/21/2023 0541   BUN 33 (H) 06/21/2023 0541   BUN 14 06/12/2023 1413   CREATININE 0.71 06/21/2023 0541   CALCIUM 8.6 (L) 06/21/2023 0541   PROT 6.5 06/20/2023 0649   PROT 7.0 06/12/2023 1413   ALBUMIN 3.2 (L) 06/20/2023 0649   ALBUMIN 3.9 (L) 06/12/2023 1413   AST 18 06/20/2023 0649   ALT 20 06/20/2023 0649   ALKPHOS 49 06/20/2023 0649   BILITOT 0.8 06/20/2023 0649   BILITOT 0.4 06/12/2023 1413   GFRNONAA >60 06/21/2023 0541   GFRAA >60 08/24/2018 0750   Lipase     Component Value Date/Time   LIPASE 142 (H) 05/27/2023 1102        Studies/Results: No results found.    Assessment/Plan 47 yo male with a locally advanced GE junction cancer, with dysphagia and weight loss. POD3 s/p placement of feeding J tube on 06/18/23. - Resume J tube feeds. Discussed with patient and he is agreeable to trying tube feeds today.  - No crushed medications are to be given via J tube, as this will clog the tube. Liquid medications only. - Pain management per palliative care - Diet as tolerated by mouth - Ok for patient to shower over incision and feeding tube. - TOC consulted for home feeding supplies. Medicaid application pending.   LOS: 7 days    Sophronia Simas, MD [facility] Surgery General, Hepatobiliary and Pancreatic Surgery 06/21/23 11:16 AM

## 2023-06-21 NOTE — Progress Notes (Addendum)
Nutrition Follow-up  DOCUMENTATION CODES:   Non-severe (moderate) malnutrition in context of chronic illness  INTERVENTION:   -Monitor magnesium, potassium, and phosphorus for at least 3 days, MD to replete as needed, as pt is at risk for refeeding syndrome. -Recommend Thiamine 100 mg daily for at least 5 days   -Continue Osmolite 1.5 @ 20 ml/hr via J-tube, advance by 10 ml every 12 hours to goal rate of 70 ml/hr. -Noted bowel regimen plan per Palliative care -Free water currently ordered: 30 ml every 6 hours  -Addendum: Per surgery, pt with bowel sounds and flatus, do not feel that tube feeds needs to be held at trickle rate and can be advanced as normal.    -D/c Ensure as pt is not drinking  -Daily weights ordered 12/18 -not done  NUTRITION DIAGNOSIS:   Moderate Malnutrition related to chronic illness, cancer and cancer related treatments as evidenced by energy intake < or equal to 75% for > or equal to 1 month, mild fat depletion, percent weight loss.  Ongoing.  GOAL:   Patient will meet greater than or equal to 90% of their needs  Not meeting yet.  MONITOR:   Labs, Weight trends, I & O's, PO intake, Supplement acceptance, TF tolerance    ASSESSMENT:   47 y.o. male with medical history significant of asthma, adenocarcinoma of the gastroesophageal junction, cancer associated pain, history of GI bleed, anemia due to chronic blood loss was sent from the cancer center after he had a near syncopal episode while they were drawing multiple vials of blood.  12/13: admitted 12/17: s/p open J-tube placement  Patient refused tube feeds yesterday. Has been constipated for over a week now. Pt agreeable to restarting tube feeds today. Recommend remain at trickle feeds until pt has BM. Bowel regimen per Palliative care.  Admission weight: 177 lbs Needs daily weights while on TF.  Medications: Compazine  Labs reviewed.  Diet Order:   Diet Order             Diet full liquid  Fluid consistency: Thin  Diet effective now                   EDUCATION NEEDS:   Education needs have been addressed  Skin:  Skin Assessment: Skin Integrity Issues: Skin Integrity Issues:: Incisions Incisions: 12/17 abdomen  Last BM:  12/6  Height:   Ht Readings from Last 1 Encounters:  06/15/23 5\' 6"  (1.676 m)    Weight:   Wt Readings from Last 1 Encounters:  06/15/23 80.6 kg    BMI:  Body mass index is 28.68 kg/m.  Estimated Nutritional Needs:   Kcal:  2200-2400  Protein:  105-115g  Fluid:  2.2L/day   Tilda Franco, MS, RD, LDN Inpatient Clinical Dietitian Contact via Secure chat

## 2023-06-21 NOTE — Discharge Instructions (Signed)

## 2023-06-21 NOTE — Evaluation (Signed)
Physical Therapy Evaluation Patient Details Name: Jesus Haynes MRN: [id number] DOB: 1975-10-07 Today's Date: 06/21/2023  History of Present Illness  Jesus Haynes is a 47 y.o. male presents with near syncopal episode while cancer center was drawing multiple vials of blood. Pt s/p open placement of feeding jejunostomy tube 06/18/23.  PMH: asthma, adenocarcinoma gastroesophageal junction, cancer associated pain, history of GI bleed, melena, anemia due to chronic blood loss  Clinical Impression  Pt admitted with above diagnosis. Pt from home, ind at baseline, states his 2 younger children were living with him and dependent on him, now pt plans to d/c to adult daughter's home and live in her basement, no steps, pt states his family has been buying DME but unsure what all they have at this point. Pt hesitant to mobilize, reports recently taking medication and not wanting to mobilize, encouraged pt and educated pt on taking advantage of pain medication to attempt mobility. Pt reports he transferred to transport chair and BSC independently this week, but agreeable to try with therapy. Once sitting EOB pt laid head down on RW and reports dizziness, nausea and pain so returns to supine. Pt with belching noted and reports improvement in symptoms. Encouraged pt to elevate HOB as able and spend time OOB in recliner as able with nursing assistance and pt verbalizes agreement. F/u and DME recs TBD pending what is at home and tolerance to therapy in hospital. Pt currently with functional limitations due to the deficits listed below (see PT Problem List). Pt will benefit from acute skilled PT to increase their independence and safety with mobility to allow discharge.           If plan is discharge home, recommend the following: A little help with walking and/or transfers;A little help with bathing/dressing/bathroom;Assistance with cooking/housework;Assist for transportation   Can travel by private vehicle         Equipment Recommendations Other (comment) (TBD)  Recommendations for Other Services       Functional Status Assessment Patient has had a recent decline in their functional status and demonstrates the ability to make significant improvements in function in a reasonable and predictable amount of time.     Precautions / Restrictions Precautions Precautions: Fall Precaution Comments: J tube Restrictions Weight Bearing Restrictions Per Provider Order: No      Mobility  Bed Mobility Overal bed mobility: Modified Independent             General bed mobility comments: slightly increased time due to pain and dizziness complaints, no physical assistance wtih supine<>sit and linen management    Transfers                   General transfer comment: pt declines    Ambulation/Gait               General Gait Details: pt declines  Stairs            Wheelchair Mobility     Tilt Bed    Modified Rankin (Stroke Patients Only)       Balance Overall balance assessment: No apparent balance deficits (not formally assessed)                                           Pertinent Vitals/Pain Pain Assessment Pain Assessment: Faces Faces Pain Scale: Hurts whole lot Pain Descriptors / Indicators: Constant Pain Intervention(s):  Limited activity within patient's tolerance, Monitored during session, Premedicated before session, Repositioned    Home Living Family/patient expects to be discharged to:: Private residence Living Arrangements: Children Available Help at Discharge: Family;Available 24 hours/day Type of Home: House Home Access: Level entry       Home Layout: Multi-level   Additional Comments: Pt reports he will move in with adult daughter at d/c into her basement with full bath and walk in shower, won't have to do steps    Prior Function Prior Level of Function : Independent/Modified Independent             Mobility  Comments: pt reports ind ADLs Comments: pt reports ind     Extremity/Trunk Assessment   Upper Extremity Assessment Upper Extremity Assessment: Overall WFL for tasks assessed    Lower Extremity Assessment Lower Extremity Assessment: Generalized weakness    Cervical / Trunk Assessment Cervical / Trunk Assessment: Normal  Communication   Communication Communication: No apparent difficulties  Cognition Arousal: Alert Behavior During Therapy: Flat affect Overall Cognitive Status: Within Functional Limits for tasks assessed                                 General Comments: pt a&o, hesitant to mobilize on eval due to recently medicated needing encouragement and education        General Comments      Exercises     Assessment/Plan    PT Assessment Patient needs continued PT services  PT Problem List Decreased strength;Decreased activity tolerance;Decreased balance;Decreased knowledge of use of DME;Pain       PT Treatment Interventions DME instruction;Gait training;Functional mobility training;Therapeutic activities;Therapeutic exercise;Balance training;Patient/family education    PT Goals (Current goals can be found in the Care Plan section)  Acute Rehab PT Goals Patient Stated Goal: regain strength and independence PT Goal Formulation: With patient Time For Goal Achievement: 07/05/23 Potential to Achieve Goals: Fair    Frequency Min 1X/week     Co-evaluation               AM-PAC PT "6 Clicks" Mobility  Outcome Measure Help needed turning from your back to your side while in a flat bed without using bedrails?: None Help needed moving from lying on your back to sitting on the side of a flat bed without using bedrails?: None Help needed moving to and from a bed to a chair (including a wheelchair)?: A Little Help needed standing up from a chair using your arms (e.g., wheelchair or bedside chair)?: A Little Help needed to walk in hospital room?: A  Lot Help needed climbing 3-5 steps with a railing? : A Lot 6 Click Score: 18    End of Session   Activity Tolerance: Patient limited by pain Patient left: in bed;with call bell/phone within reach Nurse Communication: Mobility status PT Visit Diagnosis: Muscle weakness (generalized) (M62.81);Pain    Time: 0623-7628 PT Time Calculation (min) (ACUTE ONLY): 16 min   Charges:   PT Evaluation $PT Eval Moderate Complexity: 1 Mod   PT General Charges $$ ACUTE PT VISIT: 1 Visit         Tori Taylormarie Register PT, DPT 06/21/23, 11:45 AM

## 2023-06-21 NOTE — Progress Notes (Signed)
Tube feed started at 20 per order.  Tresa Endo and male visitor at bedside.  Pt is sitting up in bed at about 45 degrees, requesting apple juice.  To get him juice at this time

## 2023-06-22 DIAGNOSIS — K5900 Constipation, unspecified: Secondary | ICD-10-CM

## 2023-06-22 DIAGNOSIS — Z79899 Other long term (current) drug therapy: Secondary | ICD-10-CM

## 2023-06-22 DIAGNOSIS — R627 Adult failure to thrive: Secondary | ICD-10-CM | POA: Diagnosis not present

## 2023-06-22 DIAGNOSIS — E440 Moderate protein-calorie malnutrition: Secondary | ICD-10-CM | POA: Diagnosis not present

## 2023-06-22 DIAGNOSIS — Z515 Encounter for palliative care: Secondary | ICD-10-CM | POA: Diagnosis not present

## 2023-06-22 LAB — PHOSPHORUS: Phosphorus: 3.2 mg/dL (ref 2.5–4.6)

## 2023-06-22 LAB — MAGNESIUM: Magnesium: 2.3 mg/dL (ref 1.7–2.4)

## 2023-06-22 MED ORDER — METHOCARBAMOL 1000 MG/10ML IJ SOLN
1000.0000 mg | Freq: Two times a day (BID) | INTRAMUSCULAR | Status: DC
  Administered 2023-06-22 – 2023-07-04 (×24): 1000 mg via INTRAVENOUS
  Filled 2023-06-22 (×25): qty 10

## 2023-06-22 MED ORDER — BISACODYL 10 MG RE SUPP
10.0000 mg | Freq: Two times a day (BID) | RECTAL | Status: DC
Start: 2023-06-22 — End: 2023-06-25
  Administered 2023-06-22 – 2023-06-24 (×2): 10 mg via RECTAL
  Filled 2023-06-22 (×6): qty 1

## 2023-06-22 NOTE — Progress Notes (Signed)
Daily Progress Note   Patient Name: Jesus Haynes       Date: 06/22/2023 DOB: [birth year]  Age: 47 y.o. MRN#: [id number] Attending Physician: Meredeth Ide, MD Primary Care Physician: Ivonne Andrew, NP Admit Date: 06/14/2023 Length of Stay: 8 days  Reason for Consultation/Follow-up: Pain control  Subjective:   CC: Patient denies having a bowel movement.  Following up regarding pain management.  Subjective:  Reviewed EMR prior to presenting to bedside.  Surgery has already evaluated patient this morning.  Patient's tube feeds were stopped around 4 AM due to feeling full.  Patient has not had a bowel movement as of yet.  Abdominal x-ray obtained yesterday showed colonic distention.  Surgery team ordered suppositories to stimulate a bowel movement and relieve what is likely colonic ileus which is likely exacerbating his intolerance of tube feeds. At time of EMR review, patient has continued to receive IV Dilaudid 1 mg x 3 doses and p.o. Dilaudid 2.5 mg x 1 dose.  Presented to bedside to meet with patient.  Patient's RN present at bedside during conversation.  No family present.  Patient laying in bed.  Patient again described that he has not had a bowel movement.  Reviewed surgery's input for suppositories scheduled twice today.  Patient had initially refused suppository ordered by surgery team.  Spoke with patient regarding this.  Discussed importance of needing stimulation so patient does not develop worsening colonic ileus and importance of being able to adjust tube feed right.  Patient then agreed to take suppository.  Inquired with patient how the oral Dilaudid dose helped to manage his pain yesterday.  Patient does not remember.  Again discussed importance of patient receiving oral pain medication at this time.  IV opioid medication should only be for breakthrough after receiving oral medication. Spent time answering questions as able.  Noted palliative medicine team continue to follow  along with patient's medical journey.  Objective:   Vital Signs:  BP 125/87 (BP Location: Left Arm)   Pulse 88   Temp 97.9 F (36.6 C) (Oral)   Resp 18   Ht 5\' 6"  (1.676 m)   Wt 80.6 kg   SpO2 95%   BMI 28.68 kg/m   Physical Exam: General: NAD, laying in bed, chronically ill-appearing Cardiovascular: RRR Respiratory: no increased work of breathing noted, not in respiratory distress Neuro: A&Ox4, following commands easily Psych: appropriately answers all questions  Imaging: I personally reviewed recent imaging.   Assessment & Plan:   Assessment: Patient is a 47 year old male with a past medical history of asthma and adenocarcinoma of the gastroesophageal junction who was admitted on 06/14/2023 for management of near syncope while drawing labs.  Patient has suffered from failure to thrive with poor oral intake and weight loss.  Patient has suffered from associated cancer pain and GI bleed.  Palliative medicine team consulted to assist pain management.  Recommendations/Plan: # Complex medical decision making/goals of care:  - Continuing appropriate medical management at this time.  -Has been recommended to patient to complete ACP documentation regarding HCPOA.  -  Code Status: Full Code  # Symptom management:  -Pain, acute on chronic in setting of adenocarcinoma of the GE junction   -Continue methadone 5 mg every 12 hours which was started on 12/18   -Continue p.o. Dilaudid liquid to 2.5 mg every 3 hours as needed   -Continue IV Dilaudid to 1 mg every 2 hours as needed for breakthrough pain only after oral opioid medication   -  Continue dexamethasone 4 mg IV daily   -Continue IV Toradol scheduled x 5 days   -Decrease scheduling of Robaxin to twice daily as would attempt to wean since patient's pain related to visceral instead of somatic pain   -May need to consider celiac plexus block in the future   -Constipation Surgery team reviewed imaging and noted colonic distention  likely sign of colonic ileus.  They have ordered scheduled suppositories today.  # Psychosocial Support:  -Mother-in-law, Jesus Haynes  # Discharge Planning: To Be Determined  Discussed with: patient, RN  Thank you for allowing the palliative care team to participate in the care Jesus Haynes.  Alvester Morin, DO Palliative Care Provider PMT # [phone]  If patient remains symptomatic despite maximum doses, please call PMT at [phone] between 0700 and 1900. Outside of these hours, please call attending, as PMT does not have night coverage.  *Please note that this is a verbal dictation therefore any spelling or grammatical errors are due to the "[employer] One" system interpretation.

## 2023-06-22 NOTE — Progress Notes (Signed)
[facility] Progress Note  4 Days Post-Op  Subjective: Trickle tube feeds overnight at 20, were stopped around 4 AM due to feeling over full.  Has not had a bowel movement, but does think he is passing a little bit of gas  Objective: Vital signs in last 24 hours: Temp:  [97.8 F (36.6 C)-97.9 F (36.6 C)] 97.9 F (36.6 C) (12/21 0625) Pulse Rate:  [88-90] 88 (12/21 0625) Resp:  [16-18] 18 (12/21 0625) BP: (117-129)/(68-91) 125/87 (12/21 0625) SpO2:  [94 %-98 %] 95 % (12/21 0625) Last BM Date : 06/07/23  Intake/Output from previous day: 12/20 0701 - 12/21 0700 In: -  Out: 200 [Urine:200] Intake/Output this shift: No intake/output data recorded.  PE: Gen:  Alert, NAD Pulm:  Normal work of breathing on room air Abd: Soft, distended, appropriately tender. Incision clean and dry, no erythema or induration. LUQ J tupe capped. Skin: warm and dry, no rashes  Psych: A&Ox3   Lab Results:  Recent Labs    06/20/23 0649  WBC 9.9  HGB 8.7*  HCT 28.7*  PLT 312   BMET Recent Labs    06/20/23 0649 06/21/23 0541  NA 138 137  K 3.3* 3.8  CL 107 105  CO2 22 24  GLUCOSE 113* 135*  BUN 21* 33*  CREATININE 0.53* 0.71  CALCIUM 8.6* 8.6*   PT/INR No results for input(s): "LABPROT", "INR" in the last 72 hours. CMP     Component Value Date/Time   NA 137 06/21/2023 0541   NA 139 06/12/2023 1413   K 3.8 06/21/2023 0541   CL 105 06/21/2023 0541   CO2 24 06/21/2023 0541   GLUCOSE 135 (H) 06/21/2023 0541   BUN 33 (H) 06/21/2023 0541   BUN 14 06/12/2023 1413   CREATININE 0.71 06/21/2023 0541   CALCIUM 8.6 (L) 06/21/2023 0541   PROT 6.5 06/20/2023 0649   PROT 7.0 06/12/2023 1413   ALBUMIN 3.2 (L) 06/20/2023 0649   ALBUMIN 3.9 (L) 06/12/2023 1413   AST 18 06/20/2023 0649   ALT 20 06/20/2023 0649   ALKPHOS 49 06/20/2023 0649   BILITOT 0.8 06/20/2023 0649   BILITOT 0.4 06/12/2023 1413   GFRNONAA >60 06/21/2023 0541   GFRAA >60 08/24/2018 0750   Lipase      Component Value Date/Time   LIPASE 142 (H) 05/27/2023 1102       Studies/Results: DG Abd 1 View Result Date: 06/21/2023 CLINICAL DATA:  Constipation EXAM: ABDOMEN - 1 VIEW COMPARISON:  None Available. FINDINGS: Gas-filled, distended colon, without large burden of stool and gas present to the rectum. Percutaneous enterostomy tube projects over the left hemiabdomen. No free air in the abdomen on supine radiographs. IMPRESSION: 1. Gas-filled, distended colon, without large burden of stool and gas present to the rectum. 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 2. Percutaneous enterostomy tube projects over the left hemiabdomen. Electronically Signed   By: Jearld Lesch M.D.   On: 06/21/2023 17:31      Assessment/Plan 47 yo male with a locally advanced GE junction cancer, with dysphagia and weight loss. POD4 s/p placement of feeding J tube on 06/18/23. -Continue trialing J-tube feeds as tolerated.  He did have an x-ray showing colonic distention, will order suppositories to hopefully stimulate a bowel movement and relieve what is likely colonic ileus which is likely exacerbating his tube feed intolerance - No crushed medications are to be given via J tube, as this will clog the tube. Liquid medications only. - Pain  management per palliative care - Diet as tolerated by mouth - Ok for patient to shower over incision and feeding tube. - TOC consulted for home feeding supplies. Medicaid application pending.   LOS: 8 days    Berna Bue  MD [facility] Surgery General, Hepatobiliary and Pancreatic Surgery 06/22/23 7:45 AM

## 2023-06-22 NOTE — Progress Notes (Signed)
Stopped and flushed patient tube feed at this time at 20 ml/hour per patient request.

## 2023-06-22 NOTE — Progress Notes (Signed)
[facility]  PROGRESS NOTE  Jesus Haynes OZH:[id number] DOB: 1976/02/13 DOA: 06/14/2023 PCP: Ivonne Andrew, NP   Brief HPI:    47 y.o. male with medical history significant of asthma, adenocarcinoma of the gastroesophageal junction, cancer associated pain, history of GI bleed, anemia due to chronic blood loss was sent from the cancer center after he had a near syncopal episode while they were drawing multiple vials of blood.  Patient was then sent to the hospital for further evaluation and treatment.    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 -Continue methadone, Decadron.  Colonic ileus -Abdominal x-ray obtained yesterday showed colonic ileus -General Surgery has started on twice a day suppositories    Pre-syncope   2D echocardiogram with LV ejection fraction of 60 to 65% will monitor closely...   Anemia due to chronic GI blood loss Hemoglobin on 06/13/2021 at 8.4 from 9.3.   Will transfuse for hemoglobin less than 7.   No evidence of bleeding noted.    Asthma Continue albuterol MDI as needed.   Appears compensated at this time  Hypokalemia -Replete  Medications     acetaminophen  650 mg Rectal Q6H   Or   acetaminophen (TYLENOL) oral liquid 160 mg/5 mL  650 mg Per Tube Q6H   bisacodyl  10 mg Rectal BID   dexamethasone (DECADRON) injection  4 mg Intravenous Daily   free water  30 mL Per Tube Q6H   ketorolac  15 mg Intravenous Q8H   methadone  5 mg Per Tube Q12H   methocarbamol (ROBAXIN) injection  1,000 mg Intravenous Q8H   sennosides  5 mL Per  Tube BID     Data Reviewed:   CBG:  No results for input(s): "GLUCAP" in the last 168 hours.  SpO2: 95 % O2 Flow Rate (L/min): 5 L/min    Vitals:   06/21/23 0616 06/21/23 1316 06/21/23 2122 06/22/23 0625  BP: 113/77 117/68 (!) 129/91 125/87  Pulse: 85 90 90 88  Resp: 18 16 18 18   Temp: 98.1 F (36.7 C) 97.8 F (36.6 C) 97.9 F (36.6 C) 97.9 F (36.6 C)  TempSrc: Oral Oral Oral Oral  SpO2: 96% 94% 98% 95%  Weight:      Height:          Data Reviewed:  Basic Metabolic Panel: Recent Labs  Lab 06/16/23 0931 06/17/23 0904 06/18/23 0606 06/20/23 0649 06/21/23 0541 06/22/23 0744  NA 134* 136 136 138 137  --   K 3.9 3.5 3.7 3.3* 3.8  --   CL 105 105 106 107 105  --   CO2 22 20* 20* 22 24  --   GLUCOSE 96 100* 98 113* 135*  --   BUN 11 11 11  21* 33*  --   CREATININE 0.58* 0.58* 0.63 0.53* 0.71  --   CALCIUM 8.1* 8.3* 8.6* 8.6* 8.6*  --   MG 1.7 1.7  --  2.0 2.4 2.3  PHOS  --   --   --  2.7 3.4 3.2    CBC: Recent Labs  Lab 06/16/23 0931 06/17/23 0904 06/18/23 0606 06/20/23 6295  WBC 6.6 6.9 7.0 9.9  HGB 8.4* 8.3* 8.4* 8.7*  HCT 28.1* 26.3* 27.6* 28.7*  MCV 81.9 80.7 81.7 80.8  PLT 293 295 288 312    LFT Recent Labs  Lab 06/18/23 0606 06/20/23 0649  AST 17 18  ALT 19 20  ALKPHOS 49 49  BILITOT 0.9 0.8  PROT 6.1* 6.5  ALBUMIN 3.0* 3.2*     Antibiotics: Anti-infectives (From admission, onward)    Start     Dose/Rate Route Frequency Ordered Stop   06/18/23 1500  ceFAZolin (ANCEF) IVPB 2g/100 mL premix        2 g 200 mL/hr over 30 Minutes Intravenous On call to O.R. 06/18/23 1446 06/18/23 1600        DVT prophylaxis: SCDs  Code Status: Full code  Family Communication: Discussed with patient's mother at bedside   CONSULTS    Subjective   Pain well-controlled.  Abdominal x-ray obtained yesterday showed colonic ileus.   Objective    Physical Examination:  Appearance no acute distress Abdomen is soft, tender to  palpation Extremities no edema  Status is: Inpatient:         Meredeth Ide   [facility] If 7PM-7AM, please contact night-coverage at [email or web address], Office  [phone]   06/22/2023, 9:35 AM  LOS: 8 days

## 2023-06-23 DIAGNOSIS — Z79899 Other long term (current) drug therapy: Secondary | ICD-10-CM | POA: Diagnosis not present

## 2023-06-23 DIAGNOSIS — Z515 Encounter for palliative care: Secondary | ICD-10-CM | POA: Diagnosis not present

## 2023-06-23 DIAGNOSIS — R627 Adult failure to thrive: Secondary | ICD-10-CM | POA: Diagnosis not present

## 2023-06-23 DIAGNOSIS — E440 Moderate protein-calorie malnutrition: Secondary | ICD-10-CM | POA: Diagnosis not present

## 2023-06-23 NOTE — Progress Notes (Signed)
[facility]  PROGRESS NOTE  Jesus Haynes ZOX:[id number] DOB: 12/04/1975 DOA: 06/14/2023 PCP: Ivonne Andrew, NP   Brief HPI:    47 y.o. male with medical history significant of asthma, adenocarcinoma of the gastroesophageal junction, cancer associated pain, history of GI bleed, anemia due to chronic blood loss was sent from the cancer center after he had a near syncopal episode while they were drawing multiple vials of blood.  Patient was then sent to the hospital for further evaluation and treatment.    Assessment/Plan:   Failure to thrive in adult secondary to adenocarcinoma of gastroesophageal junction (HCC) Cancer associated pain -Status post feeding jejunostomy tube placement -Currently on IV Dilaudid as needed -Started on methadone 5 mg every 12 hours per palliative care;  -Continue methadone, Decadron.  Colonic ileus -Abdominal x-ray obtained yesterday showed colonic ileus -General Surgery has started on twice a day suppositories -General Surgery recommends to get GI consultation -Called and discussed with Dr. Dulce Sellar, he will see patient    Pre-syncope   2D echocardiogram with LV ejection fraction of 60 to 65% will monitor closely...   Anemia due to chronic GI blood loss Hemoglobin on 06/13/2021 at 8.4 from 9.3.   Will transfuse for hemoglobin less than 7.   No evidence of bleeding noted.    Asthma Continue albuterol MDI as needed.   Appears compensated at this time  Hypokalemia -Replete  Medications     acetaminophen  650 mg Rectal Q6H   Or   acetaminophen (TYLENOL) oral liquid 160 mg/5 mL  650 mg Per Tube Q6H   bisacodyl  10 mg Rectal BID   dexamethasone (DECADRON) injection  4 mg Intravenous Daily   free water  30 mL Per Tube Q6H   ketorolac  15 mg Intravenous Q8H   methadone  5 mg Per Tube Q12H   methocarbamol (ROBAXIN) injection  1,000 mg Intravenous BID   sennosides  5 mL Per Tube BID     Data Reviewed:   CBG:  No results for  input(s): "GLUCAP" in the last 168 hours.  SpO2: 97 % O2 Flow Rate (L/min): 5 L/min    Vitals:   06/22/23 0726 06/22/23 1336 06/22/23 2127 06/23/23 0529  BP:  (!) 133/94 124/89 125/82  Pulse:  91 84 83  Resp:  17 14 14   Temp:  98.4 F (36.9 C) 98.3 F (36.8 C) 98.1 F (36.7 C)  TempSrc:  Oral Oral Oral  SpO2:  93% 100% 97%  Weight: 82.5 kg   82.2 kg  Height:          Data Reviewed:  Basic Metabolic Panel: Recent Labs  Lab 06/17/23 0904 06/18/23 0606 06/20/23 0649 06/21/23 0541 06/22/23 0744  NA 136 136 138 137  --   K 3.5 3.7 3.3* 3.8  --   CL 105 106 107 105  --   CO2 20* 20* 22 24  --   GLUCOSE 100* 98 113* 135*  --   BUN 11 11 21* 33*  --   CREATININE 0.58* 0.63 0.53* 0.71  --   CALCIUM 8.3* 8.6* 8.6* 8.6*  --   MG 1.7  --  2.0 2.4 2.3  PHOS  --   --  2.7 3.4 3.2    CBC: Recent Labs  Lab 06/17/23 0904 06/18/23 0606 06/20/23 0649  WBC 6.9 7.0 9.9  HGB 8.3* 8.4* 8.7*  HCT 26.3* 27.6* 28.7*  MCV 80.7 81.7 80.8  PLT 295 288 312    LFT  Recent Labs  Lab 06/18/23 0606 06/20/23 0649  AST 17 18  ALT 19 20  ALKPHOS 49 49  BILITOT 0.9 0.8  PROT 6.1* 6.5  ALBUMIN 3.0* 3.2*     Antibiotics: Anti-infectives (From admission, onward)    Start     Dose/Rate Route Frequency Ordered Stop   06/18/23 1500  ceFAZolin (ANCEF) IVPB 2g/100 mL premix        2 g 200 mL/hr over 30 Minutes Intravenous On call to O.R. 06/18/23 1446 06/18/23 1600        DVT prophylaxis: SCDs  Code Status: Full code  Family Communication: Discussed with patient's mother at bedside   CONSULTS    Subjective    Passing some gas.  No bowel movement yet.  Tube feeds on hold.  Objective    Physical Examination:  General-appears in no acute distress Heart-S1-S2, regular, no murmur auscultated Lungs-clear to auscultation bilaterally, no wheezing or crackles auscultated Abdomen-soft, nontender, no organomegaly Extremities-no edema in the lower  extremities Neuro-alert, oriented x3, no focal deficit noted  Status is: Inpatient:         Meredeth Ide   [facility] If 7PM-7AM, please contact night-coverage at [email or web address], Office  [phone]   06/23/2023, 11:00 AM  LOS: 9 days

## 2023-06-23 NOTE — Plan of Care (Signed)

## 2023-06-23 NOTE — Progress Notes (Signed)
[facility] Progress Note  5 Days Post-Op  Subjective: Pain and some distention this morning.  Endorses flatus but no bowel movement.  He did have 1 suppository yesterday but reports there was no output from this.  Did not pursue further dosing.  Objective: Vital signs in last 24 hours: Temp:  [98.1 F (36.7 C)-98.4 F (36.9 C)] 98.1 F (36.7 C) (12/22 0529) Pulse Rate:  [83-91] 83 (12/22 0529) Resp:  [14-17] 14 (12/22 0529) BP: (124-133)/(82-94) 125/82 (12/22 0529) SpO2:  [93 %-100 %] 97 % (12/22 0529) Weight:  [82.2 kg] 82.2 kg (12/22 0529) Last BM Date : 06/07/23  Intake/Output from previous day: 12/21 0701 - 12/22 0700 In: 30  Out: 400 [Urine:400] Intake/Output this shift: No intake/output data recorded.  PE: Gen:  Alert, NAD Pulm:  Normal work of breathing on room air Abd: Soft, distended, appropriately tender. Incision clean and dry, no erythema or induration. LUQ J tupe capped. Skin: warm and dry, no rashes  Psych: A&Ox3   Lab Results:  No results for input(s): "WBC", "HGB", "HCT", "PLT" in the last 72 hours.  BMET Recent Labs    06/21/23 0541  NA 137  K 3.8  CL 105  CO2 24  GLUCOSE 135*  BUN 33*  CREATININE 0.71  CALCIUM 8.6*   PT/INR No results for input(s): "LABPROT", "INR" in the last 72 hours. CMP     Component Value Date/Time   NA 137 06/21/2023 0541   NA 139 06/12/2023 1413   K 3.8 06/21/2023 0541   CL 105 06/21/2023 0541   CO2 24 06/21/2023 0541   GLUCOSE 135 (H) 06/21/2023 0541   BUN 33 (H) 06/21/2023 0541   BUN 14 06/12/2023 1413   CREATININE 0.71 06/21/2023 0541   CALCIUM 8.6 (L) 06/21/2023 0541   PROT 6.5 06/20/2023 0649   PROT 7.0 06/12/2023 1413   ALBUMIN 3.2 (L) 06/20/2023 0649   ALBUMIN 3.9 (L) 06/12/2023 1413   AST 18 06/20/2023 0649   ALT 20 06/20/2023 0649   ALKPHOS 49 06/20/2023 0649   BILITOT 0.8 06/20/2023 0649   BILITOT 0.4 06/12/2023 1413   GFRNONAA >60 06/21/2023 0541   GFRAA >60 08/24/2018 0750    Lipase     Component Value Date/Time   LIPASE 142 (H) 05/27/2023 1102       Studies/Results: DG Abd 1 View Result Date: 06/21/2023 CLINICAL DATA:  Constipation EXAM: ABDOMEN - 1 VIEW COMPARISON:  None Available. FINDINGS: Gas-filled, distended colon, without large burden of stool and gas present to the rectum. Percutaneous enterostomy tube projects over the left hemiabdomen. No free air in the abdomen on supine radiographs. IMPRESSION: 1. Gas-filled, distended colon, without large burden of stool and gas present to the rectum. 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 2. Percutaneous enterostomy tube projects over the left hemiabdomen. Electronically Signed   By: Jearld Lesch M.D.   On: 06/21/2023 17:31      Assessment/Plan 47 yo male with a locally advanced GE junction cancer, with dysphagia and weight loss. POD5 s/p placement of feeding J tube on 06/18/23. -Continue trialing J-tube feeds as tolerated.  He did have an x-ray 12/20 showing colonic distention, suppositories ordered to try and stimulate a bowel movement and relieve what is likely colonic ileus which is likely exacerbating his tube feed intolerance-patient has tried 1 but has not continued, having flatus but no bowel movement yet.  Would recommend continuing intermittent suppositories, if no bowel movement and continued colonic distention consider GI  consult for treatment of colonic ileus. - No crushed medications are to be given via J tube, as this will clog the tube. Liquid medications only. - Pain management per palliative care - Diet as tolerated by mouth - Ok for patient to shower over incision and feeding tube. - TOC consulted for home feeding supplies. Medicaid application pending.  Surgery team will follow peripherally.   LOS: 9 days    Berna Bue  MD [facility] Surgery General, Hepatobiliary and Pancreatic Surgery 06/23/23 8:23 AM

## 2023-06-23 NOTE — Progress Notes (Signed)
Daily Progress Note   Patient Name: Jesus Haynes       Date: 06/23/2023 DOB: July 17, 1975  Age: 47 y.o. MRN#: [id number] Attending Physician: Meredeth Ide, MD Primary Care Physician: Ivonne Andrew, NP Admit Date: 06/14/2023 Length of Stay: 9 days  Reason for Consultation/Follow-up: Pain control  Subjective:   CC: Patient having worsening nausea in setting of no bowel movement and unable to tolerate tube feeds at this time..  Following up regarding pain management.  Subjective:  Reviewed EMR prior to presenting to bedside.  At time of EMR review in past 24 hours patient has received IV Dilaudid 1 mg x 3 doses and p.o. Dilaudid 2.5 mg x 2 doses.  Patient has also received Zofran 4 mg x 2 doses. Reviewed surgery note this morning which described that patient refused second suppository after initial suppository did not lead to bowel movement.  Surgery recommending consideration of GI consult for colonic ileus.  Presented to bedside to meet with patient.  Patient laying in bed.  No family present at bedside.  Patient appears uncomfortable.  Discussed patient's care at this time.  Patient does feel the oral Dilaudid dose helps to improve his pain.  Discussed at this time using oral Dilaudid over IV Dilaudid as it provides longer lasting pain management.  Also discussed surgery's recommendation for possible GI involvement for colonic ileus.  Patient has been refusing multiple medications including scheduled suppository.  Inquired about patient's goals for medical care moving forward.  Patient notes that he still wants to pursue cancer directed therapies.  Discussed importance of appropriate medical interventions then so that patient can improve his colonic ileus which will allow him to tolerate tube feeds hopefully to the point he can actually get out of the hospital to pursue cancer directed therapies.  Explained that should patient no longer want to pursue cancer directed therapies or feel  aggressive medical care is benefiting him overall with his quality of life, patient can always go home with hospice support.  Patient still wants to attempt cancer directed therapies at this time.  All questions answered at that time.  Noted palliative medicine team to continue to follow with patient's medical journey.  Updated hospitalist and RN regarding discussion with patient.  Objective:   Vital Signs:  BP 125/82 (BP Location: Right Arm)   Pulse 83   Temp 98.1 F (36.7 C) (Oral)   Resp 14   Ht 5\' 6"  (1.676 m)   Wt 82.2 kg   SpO2 97%   BMI 29.25 kg/m   Physical Exam: General: NAD, laying in bed, chronically ill-appearing Cardiovascular: RRR Respiratory: no increased work of breathing noted, not in respiratory distress Neuro: A&Ox4, following commands easily Psych: appropriately answers all questions  Imaging: I personally reviewed recent imaging.   Assessment & Plan:   Assessment: Patient is a 47 year old male with a past medical history of asthma and adenocarcinoma of the gastroesophageal junction who was admitted on 06/14/2023 for management of near syncope while drawing labs.  Patient has suffered from failure to thrive with poor oral intake and weight loss.  Patient has suffered from associated cancer pain and GI bleed.  Palliative medicine team consulted to assist pain management.  Recommendations/Plan: # Complex medical decision making/goals of care:  - Patient states he wants to continue appropriate rest of medical interventions at this time.  Discussed importance of following medical recommendations including medication management if help is to follow-up in the outpatient setting to pursue cancer directed therapies.  -  Has been recommended to patient to complete ACP documentation regarding HCPOA.  -  Code Status: Full Code  # Symptom management:  -Pain, acute on chronic in setting of adenocarcinoma of the GE junction   -Continue methadone 5 mg every 12 hours which  was started on 12/18   -Continue p.o. Dilaudid liquid to 2.5 mg every 3 hours as needed   -Continue IV Dilaudid to 1 mg every 2 hours as needed for breakthrough pain only after oral opioid medication   -Continue dexamethasone 4 mg IV daily   -Continue IV Toradol scheduled x 5 days   -Continue scheduling of Robaxin to twice daily. Would attempt to wean since patient's pain related to visceral instead of somatic pain   -May need to consider celiac plexus block in the future   -Constipation In setting of colonic ileus.  Patient has been refusing multiple ordering medications to assist with management.  Discussed importance of medical management should patient desire to pursue further cancer directed therapies.  # Psychosocial Support:  -Mother-in-law, Zachery Dakins  # Discharge Planning: To Be Determined  Discussed with: patient,  RN, hospitalist  Thank you for allowing the palliative care team to participate in the care Camron E Narvaiz.  Alvester Morin, DO Palliative Care Provider PMT # [phone]  If patient remains symptomatic despite maximum doses, please call PMT at [phone] between 0700 and 1900. Outside of these hours, please call attending, as PMT does not have night coverage.  Personally spent 36 minutes in patient care including extensive chart review (labs, imaging, progress/consult notes, vital signs), medically appropraite exam, discussed with treatment team, education to patient, family, and staff, documenting clinical information, medication review and management, coordination of care, and available advanced directive documents.    *Please note that this is a verbal dictation therefore any spelling or grammatical errors are due to the "Dragon Medical One" system interpretation.

## 2023-06-23 NOTE — Plan of Care (Signed)
  Problem: Education: Goal: Knowledge of General Education information will improve Description: Including pain rating scale, medication(s)/side effects and non-pharmacologic comfort measures Outcome: Progressing   Problem: Health Behavior/Discharge Planning: Goal: Ability to manage health-related needs will improve Outcome: Progressing   Problem: Clinical Measurements: Goal: Ability to maintain clinical measurements within normal limits will improve Outcome: Progressing Goal: Will remain free from infection Outcome: Progressing Goal: Diagnostic test results will improve Outcome: Progressing Goal: Respiratory complications will improve Outcome: Progressing Goal: Cardiovascular complication will be avoided Outcome: Progressing   Problem: Activity: Goal: Risk for activity intolerance will decrease Outcome: Progressing   Problem: Coping: Goal: Level of anxiety will decrease Outcome: Progressing   Problem: Elimination: Goal: Will not experience complications related to bowel motility Outcome: Progressing Goal: Will not experience complications related to urinary retention Outcome: Progressing   Problem: Pain Management: Goal: General experience of comfort will improve Outcome: Progressing   Problem: Safety: Goal: Ability to remain free from injury will improve Outcome: Progressing   Problem: Skin Integrity: Goal: Risk for impaired skin integrity will decrease Outcome: Progressing

## 2023-06-24 ENCOUNTER — Inpatient Hospital Stay (HOSPITAL_COMMUNITY): Payer: Medicaid Other

## 2023-06-24 DIAGNOSIS — Z7189 Other specified counseling: Secondary | ICD-10-CM

## 2023-06-24 DIAGNOSIS — G893 Neoplasm related pain (acute) (chronic): Secondary | ICD-10-CM | POA: Diagnosis not present

## 2023-06-24 DIAGNOSIS — R627 Adult failure to thrive: Secondary | ICD-10-CM | POA: Diagnosis not present

## 2023-06-24 DIAGNOSIS — C160 Malignant neoplasm of cardia: Secondary | ICD-10-CM | POA: Diagnosis not present

## 2023-06-24 DIAGNOSIS — Z515 Encounter for palliative care: Secondary | ICD-10-CM | POA: Diagnosis not present

## 2023-06-24 LAB — OCCULT BLOOD X 1 CARD TO LAB, STOOL: Fecal Occult Bld: POSITIVE — AB

## 2023-06-24 MED ORDER — FLEET ENEMA RE ENEM
1.0000 | ENEMA | Freq: Every day | RECTAL | Status: DC
  Administered 2023-06-24: 1 via RECTAL
  Filled 2023-06-24 (×5): qty 1

## 2023-06-24 MED ORDER — DEXTROSE-SODIUM CHLORIDE 5-0.9 % IV SOLN: INTRAVENOUS | Status: AC

## 2023-06-24 MED ORDER — DIATRIZOATE MEGLUMINE & SODIUM 66-10 % PO SOLN
90.0000 mL | Freq: Once | ORAL | Status: AC
  Administered 2023-06-24: 90 mL via NASOGASTRIC
  Filled 2023-06-24: qty 90

## 2023-06-24 MED ORDER — POLYETHYLENE GLYCOL 3350 17 G PO PACK
17.0000 g | PACK | Freq: Two times a day (BID) | ORAL | Status: DC
  Administered 2023-06-24 – 2023-06-27 (×3): 17 g via ORAL
  Filled 2023-06-24 (×10): qty 1

## 2023-06-24 NOTE — Plan of Care (Signed)
  Problem: Education: Goal: Knowledge of General Education information will improve Description: Including pain rating scale, medication(s)/side effects and non-pharmacologic comfort measures Outcome: Progressing   Problem: Health Behavior/Discharge Planning: Goal: Ability to manage health-related needs will improve Outcome: Progressing   Problem: Clinical Measurements: Goal: Ability to maintain clinical measurements within normal limits will improve Outcome: Progressing Goal: Will remain free from infection Outcome: Progressing Goal: Diagnostic test results will improve Outcome: Progressing Goal: Respiratory complications will improve Outcome: Progressing Goal: Cardiovascular complication will be avoided Outcome: Progressing   Problem: Activity: Goal: Risk for activity intolerance will decrease Outcome: Progressing   Problem: Coping: Goal: Level of anxiety will decrease Outcome: Progressing   Problem: Elimination: Goal: Will not experience complications related to urinary retention Outcome: Progressing   Problem: Pain Management: Goal: General experience of comfort will improve Outcome: Progressing   Problem: Safety: Goal: Ability to remain free from injury will improve Outcome: Progressing   Problem: Skin Integrity: Goal: Risk for impaired skin integrity will decrease Outcome: Progressing   Problem: Nutrition: Goal: Adequate nutrition will be maintained Outcome: Not Progressing   Problem: Elimination: Goal: Will not experience complications related to bowel motility Outcome: Not Progressing

## 2023-06-24 NOTE — TOC Progression Note (Addendum)
Transition of Care [facility]) - Progression Note    Patient Details  Name: Jesus Haynes MRN: [id number] Date of Birth: 1975/11/24  [facility] CM/SW Contact  Beckie Busing, RN Phone Number:[phone]  06/24/2023, 10:00 AM  Clinical Narrative:    TOC continues to follow patient for medication and tube feeds. Currently patient is unable to advance tube feeds and has a recommendation for TPN. TOC following for possible 30 day LOG to provide patient with 30 day supply of tube feeds and supplies. Will continue to follow.   Tube feed order form has been placed on chart for MD to completed once patient is tolerating tube feeds and we have established feed and goal rate.         Expected Discharge Plan and Services                                               Social Determinants of Health (SDOH) Interventions SDOH Screenings   Food Insecurity: No Food Insecurity (06/14/2023)  Housing: Low Risk  (06/14/2023)  Transportation Needs: No Transportation Needs (06/14/2023)  Utilities: Not At Risk (06/14/2023)  Depression (PHQ2-9): Low Risk  (06/12/2023)  Tobacco Use: Medium Risk (06/18/2023)    Readmission Risk Interventions     No data to display

## 2023-06-24 NOTE — Progress Notes (Signed)
Patient refused to have NG tube placed at this time. Stated he wanted to have the contrast scan of his abd per Dr. Derrell Lolling recommendation first and then will consider allowing NG tube placement. Educated patient on concerns of SBO/worsening ileus and bowel backup into stomach/possible risk of aspiration. Patient gave verbal understanding despite family eagerness to have NGT placed.

## 2023-06-24 NOTE — Consult Note (Signed)
Referring Provider: TH Primary Care Physician:  Ivonne Andrew, NP Primary Gastroenterologist: Gentry Fitz  Reason for Consultation: Colonic ileus, newly diagnosed esophageal cancer  HPI: Jesus Haynes is a 47 y.o. male with newly diagnosed esophageal cancer May 28, 2023 , status post G-tube placement in June 18, 2023 currently on pain medications.  GI is consulted for further evaluation of colonic ileus.  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  Patient seen and examined at bedside.  No bowel movement for last 10 to 12 days.  Passing flatus.  Denies any blood in the stool or black stool.  Denies abdominal pain.  Denies nausea or vomiting.  Past Medical History:  Diagnosis Date   Asthma     Past Surgical History:  Procedure Laterality Date   BIOPSY  05/28/2023   Procedure: BIOPSY;  Surgeon: Vida Rigger, MD;  Location: [facility];  Service: Gastroenterology;;   ESOPHAGOGASTRODUODENOSCOPY (EGD) WITH PROPOFOL N/A 05/28/2023   Procedure: ESOPHAGOGASTRODUODENOSCOPY (EGD) WITH PROPOFOL;  Surgeon: Vida Rigger, MD;  Location: [facility];  Service: Gastroenterology;  Laterality: N/A;   IR IMAGING GUIDED PORT INSERTION  06/13/2023   UMBILICAL HERNIA REPAIR N/A 06/18/2023   Procedure: PLACEMENT OF J TUBE;  Surgeon: Fritzi Mandes, MD;  Location: [facility];  Service: General;  Laterality: N/A;    Prior to Admission medications   Medication Sig Start Date End Date Taking? Authorizing Provider  albuterol (PROVENTIL) (5 MG/ML) 0.5% nebulizer solution Take 0.5 mLs (2.5 mg total) by nebulization every 6 (six) hours as needed for wheezing or shortness of breath. 04/28/23  Yes Roxy Horseman, PA-C  albuterol (VENTOLIN HFA) 108 (90 Base) MCG/ACT inhaler Inhale 1-2 puffs into the lungs every 4 (four) hours as needed for wheezing or shortness of breath. 06/20/22  Yes Trudee Grip A, PA-C  ondansetron  (ZOFRAN-ODT) 4 MG disintegrating tablet Dissolve 1 tablet (4 mg total) in mouth every 8 (eight) hours as needed. Patient taking differently: Take 4 mg by mouth every 8 (eight) hours as needed (dissolve orally). 06/12/23 07/12/23 Yes Ivonne Andrew, NP  oxyCODONE (ROXICODONE) 5 MG/5ML solution Take 5 mLs (5 mg total) by mouth every 4 (four) hours as needed for up to 5 days for severe pain (pain score 7-10). Patient taking differently: Take 5 mg by mouth every 4 (four) hours. 06/14/23 07/18/23 Yes Pasam, Avinash, MD  feeding supplement (ENSURE ENLIVE / ENSURE PLUS) LIQD Take 237 mLs by mouth 2 (two) times daily between meals. Patient not taking: Reported on 06/14/2023 05/30/23   Lanae Boast, MD  ondansetron (ZOFRAN) 4 MG tablet Take 1 tablet (4 mg total) by mouth every 6 (six) hours as needed for up to 30 doses for nausea. Patient not taking: Reported on 06/14/2023 05/30/23   Lanae Boast, MD  pantoprazole (PROTONIX) 40 MG tablet Take 1 tablet (40 mg total) by mouth daily. Patient not taking: Reported on 06/14/2023 05/30/23 06/29/23  Lanae Boast, MD  traZODone (DESYREL) 50 MG tablet Take 0.5 tablets (25 mg total) by mouth at bedtime as needed for up to 30 doses for sleep. Patient not taking: Reported on 06/14/2023 05/30/23   Lanae Boast, MD  cetirizine (ZYRTEC) 10 MG tablet Take 1 tablet (10 mg total) by mouth daily. Patient not taking: Reported on 01/26/2019 02/23/18 07/26/20  Maczis, Elmer Sow, PA-C  fluticasone [facility]) 50 MCG/ACT nasal spray Place 1-2 sprays into both nostrils daily. Patient not taking: Reported on 01/26/2019 02/23/18 07/26/20  Maczis,  Elmer Sow, PA-C    Scheduled Meds:  acetaminophen  650 mg Rectal Q6H   Or   acetaminophen (TYLENOL) oral liquid 160 mg/5 mL  650 mg Per Tube Q6H   bisacodyl  10 mg Rectal BID   dexamethasone (DECADRON) injection  4 mg Intravenous Daily   diatrizoate meglumine-sodium  90 mL Per NG tube Once   free water  30 mL Per Tube Q6H   ketorolac  15 mg  Intravenous Q8H   methadone  5 mg Per Tube Q12H   methocarbamol (ROBAXIN) injection  1,000 mg Intravenous BID   sennosides  5 mL Per Tube BID   Continuous Infusions:  dextrose 5 % and 0.9 % NaCl     feeding supplement (OSMOLITE 1.5 CAL) Stopped (06/23/23 1851)   PRN Meds:.albuterol, diazepam, HYDROmorphone HCl **OR** HYDROmorphone (DILAUDID) injection, ondansetron **OR** ondansetron (ZOFRAN) IV, prochlorperazine, sorbitol  Allergies as of 06/14/2023   (No Known Allergies)    Family History  Problem Relation Age of Onset   Heart failure Father     Social History   Socioeconomic History   Marital status: Single    Spouse name: Not on file   Number of children: Not on file   Years of education: Not on file   Highest education level: Not on file  Occupational History   Not on file  Tobacco Use   Smoking status: Former    Current packs/day: 0.00    Types: Cigarettes    Quit date: 12/26/2012    Years since quitting: 10.4   Smokeless tobacco: Never  Vaping Use   Vaping status: Former   Quit date: 08/21/2018   Substances: Nicotine  Substance and Sexual Activity   Alcohol use: No   Drug use: No   Sexual activity: Not Currently  Other Topics Concern   Not on file  Social History Narrative   Not on file   Social Drivers of Health   Financial Resource Strain: Not on file  Food Insecurity: No Food Insecurity (06/14/2023)   Hunger Vital Sign    Worried About Running Out of Food in the Last Year: Never true    Ran Out of Food in the Last Year: Never true  Transportation Needs: No Transportation Needs (06/14/2023)   PRAPARE - Administrator, Civil Service (Medical): No    Lack of Transportation (Non-Medical): No  Physical Activity: Not on file  Stress: Not on file  Social Connections: Not on file  Intimate Partner Violence: Not At Risk (06/14/2023)   Humiliation, Afraid, Rape, and Kick questionnaire    Fear of Current or Ex-Partner: No    Emotionally  Abused: No    Physically Abused: No    Sexually Abused: No    Review of Systems: All negative except as stated above in HPI.  Physical Exam: Vital signs: Vitals:   06/23/23 2059 06/24/23 0455  BP: 121/72 125/78  Pulse: 73 87  Resp: 14 14  Temp: 98.2 F (36.8 C) 98.2 F (36.8 C)  SpO2: 99% 95%   Last BM Date : 06/07/23 General:   Alert,  Well-developed, well-nourished, pleasant and cooperative in NAD Lungs: No visible respiratory distress Heart:  Regular rate and rhythm; no murmurs, clicks, rubs,  or gallops. Abdomen: Abdomen is distended but nontender, G-tube in place, bowel sounds present , no peritoneal signs Rectal:  Deferred  GI:  Lab Results: No results for input(s): "WBC", "HGB", "HCT", "PLT" in the last 72 hours. BMET No results for input(s): "NA", "  K", "CL", "CO2", "GLUCOSE", "BUN", "CREATININE", "CALCIUM" in the last 72 hours. LFT No results for input(s): "PROT", "ALBUMIN", "AST", "ALT", "ALKPHOS", "BILITOT", "BILIDIR", "IBILI" in the last 72 hours. PT/INR No results for input(s): "LABPROT", "INR" in the last 72 hours.   Studies/Results: No results found.  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 -Newly diagnosed esophageal cancer 05/28/2023.  Has a J-tube for feeding.  Has limited oral intake.  Recommendations --------------------------  -Start MiraLAX twice a day and daily fleets enema. -Ambulate with assistance -Minimize narcotics -GI will follow    LOS: 10 days   Kathi Der  MD, FACP 06/24/2023, 9:42 AM  Contact #  [phone]

## 2023-06-24 NOTE — Progress Notes (Signed)
PT Cancellation Note  Patient Details Name: Jesus Haynes MRN: [id number] DOB: 1975/07/07   Cancelled Treatment:     Pt understandably declined PT today.  Does have a few GI issues going on and trying to drink his contrast.  Will attempt to see another day as schedule permits.     Felecia Shelling  PTA Acute  Rehabilitation Services Office M-F          [phone]

## 2023-06-24 NOTE — Progress Notes (Signed)
Daily Progress Note   Patient Name: Jesus Haynes       Date: 06/24/2023 DOB: 1976-06-08  Age: 47 y.o. MRN#: [id number] Attending Physician: Jerald Kief, MD Primary Care Physician: Ivonne Andrew, NP Admit Date: 06/14/2023 Length of Stay: 10 days  Reason for Consultation/Follow-up: Pain control  Subjective:   CC: Patient having IV placed via [place].  Following up regarding pain management.  Subjective:  Reviewed EMR prior to presenting to bedside.  At time of EMR review, patient has required IV dilaudid prn 1mg  x 3 doses. Patient has still not had a BM. Still not tolerating tube feeds. GI consulted as well.  Presented to bedside to meet with patient. Patient undergoing [place] eval for IV placement. Significant other present at bedside and she called mother-in-law to be on speaker phone. Reviewed plan of bowel management as per surgery/GI.  Inquired if patient had completed ACP documents, MIL noted he hadn't. Discussed importance of this if patient wishes for MIL to be HCPOA and son to be primary alternate. Patient agreeing with completion today if doesn't get in IV meds ahead of time. Agreed with this and noted with involve chaplain and RN to coordinate this. Spent time answered questions as able. Noted PMT would continue to follow along with patient's medical journey.   Discussed care with IDT to coordinate care.   Objective:   Vital Signs:  BP 125/78 (BP Location: Right Arm)   Pulse 87   Temp 98.2 F (36.8 C) (Oral)   Resp 14   Ht 5\' 6"  (1.676 m)   Wt 81.1 kg   SpO2 95%   BMI 28.86 kg/m   Physical Exam: General: NAD, laying in bed, chronically ill-appearing Cardiovascular: RRR Respiratory: no increased work of breathing noted, not in respiratory distress Neuro: A&Ox4, following commands easily Psych: appropriately answers all questions  Imaging: I personally reviewed recent imaging.   Assessment & Plan:   Assessment: Patient is a 47 year old male with a past  medical history of asthma and adenocarcinoma of the gastroesophageal junction who was admitted on 06/14/2023 for management of near syncope while drawing labs.  Patient has suffered from failure to thrive with poor oral intake and weight loss.  Patient has suffered from associated cancer pain and GI bleed.  Palliative medicine team consulted to assist pain management.  Recommendations/Plan: # Complex medical decision making/goals of care:  - Discussed care with patient and family as detailed above in HPI. Continuing with appropraite medical interventions at this time. Have emphasized need for patient to follow medical recommendations if he wants to continue aggressive medical interventions to eventually obtain outpatient cancer directed therapies.   -Again reviewed importance of completing ACP documentation naming HCPOA. Attempted to coordinate completion today with RN, chaplain, and family.   -  Code Status: Full Code  # Symptom management:  -Pain, acute on chronic in setting of adenocarcinoma of the GE junction   -Continue methadone 5 mg every 12 hours which was started on 12/18   -Continue p.o. Dilaudid liquid to 2.5 mg every 3 hours as needed   -Continue IV Dilaudid to 1 mg every 2 hours as needed for breakthrough pain only after oral opioid medication   -Continue dexamethasone 4 mg IV daily   -Continue IV Toradol scheduled x 5 days   -Continue scheduling of Robaxin to twice daily. Would attempt to wean since patient's pain related to visceral instead of somatic pain   -May need to consider celiac plexus block in the future   -  Constipation -Management as per GI and surgery.   # Psychosocial Support:  -Mother-in-law, Zachery Dakins  # Discharge Planning: To Be Determined  Discussed with: patient,  patient's MIL and significant other, RN, hospitalist, chaplain  Thank you for allowing the palliative care team to participate in the care Tandy E Puchalski.  Alvester Morin, DO Palliative Care  Provider PMT # [phone]  If patient remains symptomatic despite maximum doses, please call PMT at [phone] between 0700 and 1900. Outside of these hours, please call attending, as PMT does not have night coverage.  Personally spent 35 minutes in patient care including extensive chart review (labs, imaging, progress/consult notes, vital signs), medically appropraite exam, discussed with treatment team, education to patient, family, and staff, documenting clinical information, medication review and management, coordination of care, and available advanced directive documents.   *Please note that this is a verbal dictation therefore any spelling or grammatical errors are due to the "Dragon Medical One" system interpretation.

## 2023-06-24 NOTE — Progress Notes (Signed)
Paged/messaged Laurell Josephs with chaplain services per patient/family request.

## 2023-06-24 NOTE — Progress Notes (Signed)
Chaplain spoke with Jesus Haynes by phone to discuss notarizing HCPOA.  He wanted to wait until his son is present so that his son can understand his responsibilities.  Chaplain attempted at a later time in the day to check in and see if son was coming, but he was Jesus Haynes was not available.  Chaplain will follow up tomorrow.

## 2023-06-24 NOTE — Progress Notes (Signed)
Spoke with Dr. Derrell Lolling to inquire about order for imaging of abd for this pt; patient to have contrast given and then 8 hrs lated patient to have x-ray of abd to see if contrast has left system. Explained the thought behind NGT placement and per Dr. Derrell Lolling unsure if this will aid in pt constant feeling of fullness/nausea/hiccups as most of this obstruction is from colon. This information explained to patient. Patient uncomfortable with amount of contrast being given.

## 2023-06-24 NOTE — Progress Notes (Signed)
6 Days Post-Op   Subjective/Chief Complaint: Pt con't with min j TFs tolerance No BMs, passing flatus   Objective: Vital signs in last 24 hours: Temp:  [98.2 F (36.8 C)-98.4 F (36.9 C)] 98.2 F (36.8 C) (12/23 0455) Pulse Rate:  [73-87] 87 (12/23 0455) Resp:  [14-18] 14 (12/23 0455) BP: (120-125)/(71-78) 125/78 (12/23 0455) SpO2:  [93 %-99 %] 95 % (12/23 0455) Weight:  [81.1 kg] 81.1 kg (12/23 0455) Last BM Date : 06/07/23  Intake/Output from previous day: 12/22 0701 - 12/23 0700 In: 1294 [P.O.:600; NG/GT:574] Out: 700 [Urine:700] Intake/Output this shift: No intake/output data recorded.  PE:  Constitutional: No acute distress, conversant, appears states age. Eyes: Anicteric sclerae, moist conjunctiva, no lid lag Lungs: Clear to auscultation bilaterally, normal respiratory effort CV: regular rate and rhythm, no murmurs, no peripheral edema, pedal pulses 2+ GI: Soft, no masses or hepatosplenomegaly, non-tender to palpation, inc c/d/I, min dist Skin: No rashes, palpation reveals normal turgor Psychiatric: appropriate judgment and insight, oriented to person, place, and time   Lab Results:  No results for input(s): "WBC", "HGB", "HCT", "PLT" in the last 72 hours. BMET No results for input(s): "NA", "K", "CL", "CO2", "GLUCOSE", "BUN", "CREATININE", "CALCIUM" in the last 72 hours. PT/INR No results for input(s): "LABPROT", "INR" in the last 72 hours. ABG No results for input(s): "PHART", "HCO3" in the last 72 hours.  Invalid input(s): "PCO2", "PO2"  Studies/Results: No results found.  Anti-infectives: Anti-infectives (From admission, onward)    Start     Dose/Rate Route Frequency Ordered Stop   06/18/23 1500  ceFAZolin (ANCEF) IVPB 2g/100 mL premix        2 g 200 mL/hr over 30 Minutes Intravenous On call to O.R. 06/18/23 1446 06/18/23 1600       Assessment/Plan: 47 yo male with a locally advanced GE junction cancer, with dysphagia and weight loss. POD6 s/p  placement of feeding J tube on 06/18/23. -Continue trialing J-tube feeds as tolerated.  He did have an x-ray 12/20 showing colonic distention, suppositories ordered to try and stimulate a bowel movement and relieve what is likely colonic ileus which is likely exacerbating his tube feed intolerance-patient has tried 1 but has not continued, having flatus but no bowel movement yet.  Would recommend continuing intermittent suppositories, if no bowel movement and continued colonic distention consider GI consult for treatment of colonic ileus. -Will order SBO protocol to see if this will move things through - No crushed medications are to be given via J tube, as this will clog the tube. Liquid medications only. - Pain management per palliative care - Diet as tolerated by mouth - Ok for patient to shower over incision and feeding tube. - TOC consulted for home feeding supplies. Medicaid application pending.   Surgery team will follow peripherally.  LOS: 10 days    Axel Filler 06/24/2023

## 2023-06-24 NOTE — Progress Notes (Signed)
Patient finished his contrast for delayed DG abd x-ray at 1430. Order adjusted to have x-ray completed at 2230.

## 2023-06-24 NOTE — Progress Notes (Signed)
Attempted to get patient OOB to bedside commode. Patient became very dizzy with movement/sitting up. Unable to attempt to get to [facility] for possible BM attempt. Patient too weak/dizzy to stand, vitals taken to assess orthostatic hypotension (MAP 86, O2 92% on Room Air). Patient used bedpan for BM.

## 2023-06-24 NOTE — Progress Notes (Signed)
  Progress Note   Patient: Jesus Haynes ZOX:[id number] DOB: 1975/10/11 DOA: 06/14/2023     10 DOS: the patient was seen and examined on 06/24/2023   Brief hospital course: 47 y.o. male with medical history significant of asthma, adenocarcinoma of the gastroesophageal junction, cancer associated pain, history of GI bleed, anemia due to chronic blood loss was sent from the cancer center after he had a near syncopal episode while they were drawing multiple vials of blood.  Patient was then sent to the hospital for further evaluation and treatment.   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   Colonic ileus -Abdominal x-ray obtained yesterday showed colonic ileus -General Surgery has started on twice a day suppositories, planned for SBO protocol today -GI consulted with recs to cont miralax bid and fleets enema. Ambulate with assistance    Pre-syncope   2D echocardiogram with LV ejection fraction of 60 to 65%  -Stable currenlty   Anemia due to chronic GI blood loss Hemoglobin on 06/13/2021 at 8.4 from 9.3.   Will transfuse for hemoglobin less than 7.      Asthma Continue albuterol MDI as needed.   Appears compensated at this time   Hypokalemia -Cont to follow and replace lytes as needed      Subjective: Feeling generalized malaise and weakness today  Physical Exam: Vitals:   06/23/23 2059 06/24/23 0455 06/24/23 1302 06/24/23 1518  BP: 121/72 125/78 115/73 114/74  Pulse: 73 87 82   Resp: 14 14    Temp: 98.2 F (36.8 C) 98.2 F (36.8 C) 97.9 F (36.6 C)   TempSrc: Oral Oral Oral   SpO2: 99% 95% 96% 91%  Weight:  81.1 kg    Height:       General exam: Awake, laying in bed, in nad Respiratory system:  Normal respiratory effort, no wheezing Cardiovascular system: regular rate, s1, s2 Gastrointestinal system: Soft, nondistended, positive BS Central nervous system: CN2-12 grossly intact, strength intact Extremities: Perfused, no clubbing Skin: Normal skin turgor, no notable skin lesions seen Psychiatry: Mood normal // no visual hallucinations   Data Reviewed:  There are no new results to review at this time.  Family Communication: Pt in room, fmaily at bedside  Disposition: Status is: Inpatient Remains inpatient appropriate because: Severity of illness  Planned Discharge Destination: Home    Author: Rickey Barbara, MD 06/24/2023 6:23 PM  For on call review [email or web address].

## 2023-06-24 NOTE — Progress Notes (Incomplete)
Spoke with radiology to inquire about who we tell when the contrast has been given and when the 8 hour x-ray needs to be

## 2023-06-24 NOTE — Progress Notes (Signed)
Nutrition Follow-up  DOCUMENTATION CODES:   Non-severe (moderate) malnutrition in context of chronic illness  INTERVENTION:   -Monitor magnesium, potassium, and phosphorus for at least 3 days, MD to replete as needed, as pt is at risk for refeeding syndrome. -Recommend Thiamine 100 mg daily for at least 5 days    -Recommend TPN initiation as pt is unable to get tube feedings advanced at this time.  -Continues to be at refeeding risk given has never received tube feeding at goal rate, has struggled to get TF advanced.  -Free water currently ordered per J-tube: 30 ml every 6 hours  NUTRITION DIAGNOSIS:   Moderate Malnutrition related to chronic illness, cancer and cancer related treatments as evidenced by energy intake < or equal to 75% for > or equal to 1 month, mild fat depletion, percent weight loss.  Ongoing.  GOAL:   Patient will meet greater than or equal to 90% of their needs  Not meeting.  MONITOR:   Labs, Weight trends, I & O's, PO intake, Supplement acceptance, TF tolerance  ASSESSMENT:   47 y.o. male with medical history significant of asthma, adenocarcinoma of the gastroesophageal junction, cancer associated pain, history of GI bleed, anemia due to chronic blood loss was sent from the cancer center after he had a near syncopal episode while they were drawing multiple vials of blood.  12/13: admitted 12/17: s/p open J-tube placement  RD initially recommended on 12/20 pt continue at trickle tube feeding rate d/t not having a BM for 14 days at that time. RD then adjusted tube feeding recommendations in note on 12/20 for advancement after receiving message from surgery that afternoon that pt was having bowel sounds and didn't have an ileus and there was no reason to recommend staying at trickle tube feeding rate. Addendum was added to that note.   Per chart review today, pt was found to have colonic ileus per abdominal x-ray on 12/20. Now surgery recommending SBO  protocol so would recommend TPN initiation until TF can be safely advanced.   Admission weight: 177 lbs Current weight: 178 lbs  Medications: Dulcolax, Senokot, D5 infusion, Zofran  Labs reviewed.  Diet Order:   Diet Order             Diet full liquid Fluid consistency: Thin  Diet effective now                   EDUCATION NEEDS:   Education needs have been addressed  Skin:  Skin Assessment: Skin Integrity Issues: Skin Integrity Issues:: Incisions Incisions: 12/17 abdomen  Last BM:  12/6  Height:   Ht Readings from Last 1 Encounters:  06/15/23 5\' 6"  (1.676 m)    Weight:   Wt Readings from Last 1 Encounters:  06/24/23 81.1 kg    BMI:  Body mass index is 28.86 kg/m.  Estimated Nutritional Needs:   Kcal:  2200-2400  Protein:  105-115g  Fluid:  2.2L/day  Tilda Franco, MS, RD, LDN Inpatient Clinical Dietitian Contact via Secure chat

## 2023-06-24 NOTE — Plan of Care (Signed)

## 2023-06-25 ENCOUNTER — Other Ambulatory Visit (HOSPITAL_COMMUNITY): Payer: Self-pay

## 2023-06-25 DIAGNOSIS — R627 Adult failure to thrive: Secondary | ICD-10-CM | POA: Diagnosis not present

## 2023-06-25 DIAGNOSIS — C160 Malignant neoplasm of cardia: Secondary | ICD-10-CM | POA: Diagnosis not present

## 2023-06-25 DIAGNOSIS — G893 Neoplasm related pain (acute) (chronic): Secondary | ICD-10-CM | POA: Diagnosis not present

## 2023-06-25 LAB — COMPREHENSIVE METABOLIC PANEL
ALT: 63 U/L — ABNORMAL HIGH (ref 0–44)
AST: 37 U/L (ref 15–41)
Albumin: 3 g/dL — ABNORMAL LOW (ref 3.5–5.0)
Alkaline Phosphatase: 48 U/L (ref 38–126)
Anion gap: 11 (ref 5–15)
BUN: 34 mg/dL — ABNORMAL HIGH (ref 6–20)
CO2: 24 mmol/L (ref 22–32)
Calcium: 8.6 mg/dL — ABNORMAL LOW (ref 8.9–10.3)
Chloride: 106 mmol/L (ref 98–111)
Creatinine, Ser: 0.68 mg/dL (ref 0.61–1.24)
GFR, Estimated: >60 mL/min (ref >60)
Glucose, Bld: 151 mg/dL — ABNORMAL HIGH (ref 70–99)
Potassium: 3.5 mmol/L (ref 3.5–5.1)
Sodium: 141 mmol/L (ref 135–145)
Total Bilirubin: 0.3 mg/dL (ref <1.2)
Total Protein: 5.9 g/dL — ABNORMAL LOW (ref 6.5–8.1)

## 2023-06-25 LAB — ABO/RH: ABO/RH(D): O POS

## 2023-06-25 LAB — PREPARE RBC (CROSSMATCH)

## 2023-06-25 LAB — CBC
HCT: 18 % — ABNORMAL LOW (ref 39.0–52.0)
HCT: 25.4 % — ABNORMAL LOW (ref 39.0–52.0)
Hemoglobin: 5.3 g/dL — CL (ref 13.0–17.0)
Hemoglobin: 8 g/dL — ABNORMAL LOW (ref 13.0–17.0)
MCH: 24.3 pg — ABNORMAL LOW (ref 26.0–34.0)
MCH: 26.7 pg (ref 26.0–34.0)
MCHC: 29.4 g/dL — ABNORMAL LOW (ref 30.0–36.0)
MCHC: 31.5 g/dL (ref 30.0–36.0)
MCV: 82.6 fL (ref 80.0–100.0)
MCV: 84.7 fL (ref 80.0–100.0)
Platelets: 283 10*3/uL (ref 150–400)
Platelets: 282 10*3/uL (ref 150–400)
RBC: 2.18 MIL/uL — ABNORMAL LOW (ref 4.22–5.81)
RBC: 3 MIL/uL — ABNORMAL LOW (ref 4.22–5.81)
RDW: 15.7 % — ABNORMAL HIGH (ref 11.5–15.5)
RDW: 15.7 % — ABNORMAL HIGH (ref 11.5–15.5)
WBC: 11.8 10*3/uL — ABNORMAL HIGH (ref 4.0–10.5)
WBC: 12.4 10*3/uL — ABNORMAL HIGH (ref 4.0–10.5)
nRBC: 0.3 % — ABNORMAL HIGH (ref 0.0–0.2)
nRBC: 0.5 % — ABNORMAL HIGH (ref 0.0–0.2)

## 2023-06-25 MED ORDER — SODIUM CHLORIDE 0.9% IV SOLUTION: Freq: Once | INTRAVENOUS | Status: AC

## 2023-06-25 NOTE — Progress Notes (Signed)
  Progress Note   Patient: Jesus Haynes AVW:[id number] DOB: September 24, 1975 DOA: 06/14/2023     11 DOS: the patient was seen and examined on 06/25/2023   Brief hospital course: 47 y.o. male with medical history significant of asthma, adenocarcinoma of the gastroesophageal junction, cancer associated pain, history of GI bleed, anemia due to chronic blood loss was sent from the cancer center after he had a near syncopal episode while they were drawing multiple vials of blood.  Patient was then sent to the hospital for further evaluation and treatment.   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 Results after enema yesterday. GI recs to cont fleets enema for now, d/c dulcoax suppository, Miralax BID and senna BID   Pre-syncope   2D echocardiogram with LV ejection fraction of 60 to 65%  -Stable currenlty   Anemia due to chronic GI blood loss Hemoglobin down to 5.2 this AM, stools are heme pos -transfused 2 units PRBC -Follow hgb trends  Will transfuse for hemoglobin less than 7.      Asthma Continue albuterol MDI as needed.   Appears compensated at this time   Hypokalemia -Cont to follow and replace lytes as needed      Subjective: Reports BM yesterday  Physical Exam: Vitals:   06/25/23 1420 06/25/23 1600 06/25/23 1630 06/25/23 1648  BP: 119/63 110/65 133/67 117/75  Pulse: 84 86 79 79  Resp: 20 18 18 18   Temp: 98.4 F (36.9 C) 98.5 F (36.9 C) 98.4 F (36.9 C) 98.6 F (37 C)  TempSrc: Oral Oral Oral Oral  SpO2: 97% 97% 96% 95%  Weight:      Height:       General exam: Conversant, in no  acute distress Respiratory system: normal chest rise, clear, no audible wheezing Cardiovascular system: regular rhythm, s1-s2 Gastrointestinal system: Nondistended, nontender, pos BS Central nervous system: No seizures, no tremors Extremities: No cyanosis, no joint deformities Skin: No rashes, no pallor Psychiatry: Affect normal // no auditory hallucinations   Data Reviewed:  Labs reviewed: Na 141, K 3.5, Cr 0.68, WBC 11.8, Hgb 5.3, Plts 283  Family Communication: Pt in room, family at bedside  Disposition: Status is: Inpatient Remains inpatient appropriate because: Severity of illness  Planned Discharge Destination: Home    Author: Rickey Barbara, MD 06/25/2023 5:53 PM  For on call review [email or web address].

## 2023-06-25 NOTE — Plan of Care (Signed)
  Problem: Education: Goal: Knowledge of General Education information will improve Description: Including pain rating scale, medication(s)/side effects and non-pharmacologic comfort measures Outcome: Progressing   Problem: Pain Management: Goal: General experience of comfort will improve Outcome: Progressing

## 2023-06-25 NOTE — Plan of Care (Signed)

## 2023-06-25 NOTE — Progress Notes (Signed)
Daily Progress Note   Patient Name: Jesus Haynes       Date: 06/25/2023 DOB: May 29, 1976  Age: 47 y.o. MRN#: [id number] Attending Physician: Jerald Kief, MD Primary Care Physician: Ivonne Andrew, NP Admit Date: 06/14/2023 Length of Stay: 11 days  Reason for Consultation/Follow-up: Pain control  Subjective:   DG:LOVFIE pain.Following up regarding pain management.  Subjective:  Reviewed EMR prior to presenting to bedside.     Presented to bedside to meet with patient.  Significant other present at bedside  Spent time answered questions as able. Noted PMT would continue to follow along with patient's medical journey.   Discussed care with IDT to coordinate care.   Objective:   Vital Signs:  BP 115/68 (BP Location: Left Arm)   Pulse 88   Temp 98.4 F (36.9 C) (Oral)   Resp 18   Ht 5\' 6"  (1.676 m)   Wt 79.6 kg   SpO2 96%   BMI 28.32 kg/m   Physical Exam: General: NAD, laying in bed, chronically ill-appearing Cardiovascular: RRR Respiratory: no increased work of breathing noted, not in respiratory distress Neuro: A&Ox4, following commands easily Psych: appropriately answers all questions  Imaging: I personally reviewed recent imaging.   Assessment & Plan:   Assessment: Patient is a 47 year old male with a past medical history of asthma and adenocarcinoma of the gastroesophageal junction who was admitted on 06/14/2023 for management of near syncope while drawing labs.  Patient has suffered from failure to thrive with poor oral intake and weight loss.  Patient has suffered from associated cancer pain and GI bleed.  Palliative medicine team consulted to assist pain management.  Recommendations/Plan: # Complex medical decision making/goals of care:  - Discussed care with patient and family as detailed above in HPI. Continuing with appropraite medical interventions at this time. Have emphasized need for patient to follow medical recommendations if he wants to  continue aggressive medical interventions to eventually obtain outpatient cancer directed therapies.   -Again reviewed importance of completing ACP documentation naming HCPOA. Attempted to coordinate completion today with RN, chaplain, and family.   -  Code Status: Full Code  # Symptom management:  -Pain, acute on chronic in setting of adenocarcinoma of the GE junction   -Continue methadone 5 mg every 12 hours which was started on 12/18   -Continue p.o. Dilaudid liquid to 2.5 mg every 3 hours as needed   -Continue IV Dilaudid to 1 mg every 2 hours as needed for breakthrough pain only after oral opioid medication   -Continue dexamethasone 4 mg IV daily   -Continue IV Toradol scheduled x 5 days   -Continue scheduling of Robaxin to twice daily. Would attempt to wean since patient's pain related to visceral instead of somatic pain   -May need to consider celiac plexus block in the future   -Constipation -Management as per GI and surgery.   # Psychosocial Support:  -Mother-in-law, Jesus Haynes  # Discharge Planning: To Be Determined  Discussed with: patient,   Thank you for allowing the palliative care team to participate in the care [place] E Hieronymus.  Low MDM Rosalin Hawking MD.  Palliative Care Provider PMT # [phone]  If patient remains symptomatic despite maximum doses, please call PMT at [phone] between 0700 and 1900. Outside of these hours, please call attending, as PMT does not have night coverage.  Personally spent 35 minutes in patient care including extensive chart review (labs, imaging, progress/consult notes, vital signs), medically appropraite exam, discussed with treatment  team, education to patient, family, and staff, documenting clinical information, medication review and management, coordination of care, and available advanced directive documents.   *Please note that this is a verbal dictation therefore any spelling or grammatical errors are due to the "[employer]" system interpretation.

## 2023-06-25 NOTE — Progress Notes (Signed)
Chaplain attempted again to meet with Jesus Haynes but he was very drowsy.  Chaplain spoke with his children's mother and explained that they could complete the document without designees being present as long as Light was feeling up to signing. Chaplain will call back this afternoon.

## 2023-06-25 NOTE — Progress Notes (Signed)
7 Days Post-Op   Subjective/Chief Complaint: Pt states he feels somewhat better today Had BM yesterday with enema KUB reviewed  Objective: Vital signs in last 24 hours: Temp:  [97.9 F (36.6 C)-98.2 F (36.8 C)] 98 F (36.7 C) (12/24 0635) Pulse Rate:  [77-88] 88 (12/24 0635) Resp:  [18] 18 (12/24 0635) BP: (113-115)/(67-77) 113/77 (12/24 0635) SpO2:  [91 %-100 %] 100 % (12/24 0635) Weight:  [79.6 kg] 79.6 kg (12/24 0500) Last BM Date : 06/24/23  Intake/Output from previous day: 12/23 0701 - 12/24 0700 In: 1576.5 [P.O.:170; I.V.:1016.5] Out: 325 [Urine:325] Intake/Output this shift: No intake/output data recorded.  PE:  Constitutional: No acute distress, conversant, appears states age. Eyes: Anicteric sclerae, moist conjunctiva, no lid lag Lungs: Clear to auscultation bilaterally, normal respiratory effort CV: regular rate and rhythm, no murmurs, no peripheral edema, pedal pulses 2+ GI: Soft, no masses or hepatosplenomegaly, non-tender to palpation, inc c/d/i Skin: No rashes, palpation reveals normal turgor Psychiatric: appropriate judgment and insight, oriented to person, place, and time  Studies/Results: DG Abd Portable 1V-Small Bowel Obstruction Protocol-initial, 8 hr delay Result Date: 06/25/2023 CLINICAL DATA:  8 hour delay for small bowel obstruction EXAM: PORTABLE ABDOMEN - 1 VIEW COMPARISON:  06/21/2023 FINDINGS: On the 8 hour image, there is contrast throughout the colon into the rectum. Continued gaseous distention of the colon. No small bowel dilatation. Change in ostomy catheter again noted in the left abdomen. IMPRESSION: Contrast throughout the colon into the rectum. No evidence of small bowel obstruction. Electronically Signed   By: Charlett Nose M.D.   On: 06/25/2023 00:16    Anti-infectives: Anti-infectives (From admission, onward)    Start     Dose/Rate Route Frequency Ordered Stop   06/18/23 1500  ceFAZolin (ANCEF) IVPB 2g/100 mL premix        2  g 200 mL/hr over 30 Minutes Intravenous On call to O.R. 06/18/23 1446 06/18/23 1600       Assessment/Plan: 47 yo male with a locally advanced GE junction cancer, with dysphagia and weight loss.  POD#7 s/p placement of feeding J tube on 06/18/23. -Continue trialing J-tube feeds as tolerated.  He did have an x-ray 12/20 showing colonic distention, suppositories ordered to try and stimulate a bowel movement and relieve what is likely colonic ileus which is likely exacerbating his tube feed intolerance-patient has tried 1 but has not continued, having flatus but no bowel movement yet.  Would recommend continuing intermittent suppositories, if no bowel movement and continued colonic distention consider GI consult for treatment of colonic ileus. - SBO protocol shows contrast in colon, so likely just has some colonic atony complicated with narcotics rx - No crushed medications are to be given via J tube, as this will clog the tube. Liquid medications only. - Pain management per palliative care - Diet as tolerated by mouth - Ok for patient to shower over incision and feeding tube. - TOC consulted for home feeding supplies. Medicaid application pending. - Pt would benefit from mobilizaiton   Surgery team will follow peripherally.  LOS: 11 days    Axel Filler 06/25/2023

## 2023-06-25 NOTE — Progress Notes (Signed)
[facility] Gastroenterology Progress Note  Jesus Haynes 47 y.o. June 07, 1976  CC: Colonic ileus   Subjective: Patient seen and examined at bedside.  No acute GI issues overnight.  Had a bowel movement yesterday after enema.  ROS : Afebrile, negative for chest pain.   Objective: Vital signs in last 24 hours: Vitals:   06/24/23 2118 06/25/23 0635  BP: 114/67 113/77  Pulse: 77 88  Resp: 18 18  Temp: 98.2 F (36.8 C) 98 F (36.7 C)  SpO2: 93% 100%    Physical Exam:  General:   Alert,  Well-developed, well-nourished, pleasant and cooperative in NAD Lungs: No visible respiratory distress Heart:  Regular rate and rhythm; no murmurs, clicks, rubs,  or gallops. Abdomen: Abdomen is distended but nontender, G-tube in place, bowel sounds present , no peritoneal signs   Lab Results: Recent Labs    06/25/23 0604  NA 141  K 3.5  CL 106  CO2 24  GLUCOSE 151*  BUN 34*  CREATININE 0.68  CALCIUM 8.6*   Recent Labs    06/25/23 0604  AST 37  ALT 63*  ALKPHOS 48  BILITOT 0.3  PROT 5.9*  ALBUMIN 3.0*   Recent Labs    06/25/23 0743  WBC 11.8*  HGB 5.3*  HCT 18.0*  MCV 82.6  PLT 283   No results for input(s): "LABPROT", "INR" in the last 72 hours.    Assessment/Plan: -Colonic ileus likely from narcotic use.  Having bowel movement with fleets enema. -Newly diagnosed esophageal cancer 05/28/2023.  Has a J-tube for feeding.  Has limited oral intake. -Anemia with occult blood positive.  No overt bleeding.  Globin dropped to 5.3 today.   Recommendations --------------------------  -Continue fleets enema for now.  D/C Dulcolax suppository. -MiraLAX twice a day and Senokot twice a day -Ambulate with assistance.  Minimize narcotics. -J-tube management per surgical team -Repeat CBC.  Transfuse to keep hemoglobin around 7. -Check CBC and INR again in the morning.  If continues to have drop in hemoglobin, recommend CT abdomen pelvis with IV contrast to rule out  retroperitoneal hematoma. -GI will follow   Kathi Der MD, FACP 06/25/2023, 10:41 AM  Contact #  [phone]

## 2023-06-26 ENCOUNTER — Inpatient Hospital Stay (HOSPITAL_COMMUNITY): Payer: Medicaid Other

## 2023-06-26 DIAGNOSIS — C160 Malignant neoplasm of cardia: Secondary | ICD-10-CM | POA: Diagnosis not present

## 2023-06-26 DIAGNOSIS — D500 Iron deficiency anemia secondary to blood loss (chronic): Secondary | ICD-10-CM | POA: Diagnosis not present

## 2023-06-26 DIAGNOSIS — R627 Adult failure to thrive: Secondary | ICD-10-CM | POA: Diagnosis not present

## 2023-06-26 LAB — TYPE AND SCREEN
ABO/RH(D): O POS
Antibody Screen: NEGATIVE
Unit division: 0
Unit division: 0

## 2023-06-26 LAB — BPAM RBC
Blood Product Expiration Date: 202501252359
Blood Product Expiration Date: 202501252359
ISSUE DATE / TIME: 202412241302
ISSUE DATE / TIME: 202412241624
Unit Type and Rh: 5100
Unit Type and Rh: 5100

## 2023-06-26 LAB — CBC
HCT: 24.6 % — ABNORMAL LOW (ref 39.0–52.0)
Hemoglobin: 7.8 g/dL — ABNORMAL LOW (ref 13.0–17.0)
MCH: 26.7 pg (ref 26.0–34.0)
MCHC: 31.7 g/dL (ref 30.0–36.0)
MCV: 84.2 fL (ref 80.0–100.0)
Platelets: 280 10*3/uL (ref 150–400)
RBC: 2.92 MIL/uL — ABNORMAL LOW (ref 4.22–5.81)
RDW: 15.8 % — ABNORMAL HIGH (ref 11.5–15.5)
WBC: 12.7 10*3/uL — ABNORMAL HIGH (ref 4.0–10.5)
nRBC: 0.2 % (ref 0.0–0.2)

## 2023-06-26 LAB — PROTIME-INR
INR: 1.3 — ABNORMAL HIGH (ref 0.8–1.2)
Prothrombin Time: 16.3 s — ABNORMAL HIGH (ref 11.4–15.2)

## 2023-06-26 MED ORDER — IOHEXOL 300 MG/ML  SOLN: 30.0000 mL | Freq: Once | INTRAMUSCULAR | Status: DC | PRN

## 2023-06-26 MED ORDER — IOHEXOL 300 MG/ML  SOLN
100.0000 mL | Freq: Once | INTRAMUSCULAR | Status: AC | PRN
  Administered 2023-06-26: 100 mL via INTRAVENOUS

## 2023-06-26 NOTE — Progress Notes (Signed)
Patient requested his Osmolite be stopped for a couple of hours. Patient educated on Tube Feeding Benefits.

## 2023-06-26 NOTE — Progress Notes (Signed)
[facility] Gastroenterology Progress Note  NICOLAE DANN 47 y.o. 1975-11-17  CC: Colonic ileus   Subjective: Patient seen and examined at bedside.  Complaining of ongoing abdominal pain.  No bowel movement yesterday or today.  Discussed with RN at bedside.  He is refusing MiraLAX this morning.  ROS : Afebrile, negative for chest pain.   Objective: Vital signs in last 24 hours: Vitals:   06/25/23 1851 06/25/23 2315  BP: 125/75 109/82  Pulse: 80 84  Resp: 18 16  Temp: 98.1 F (36.7 C) 98.1 F (36.7 C)  SpO2: 96% 95%    Physical Exam:  General:   Alert,  Well-developed, well-nourished, pleasant and cooperative in NAD Lungs: No visible respiratory distress Heart:  Regular rate and rhythm; no murmurs, clicks, rubs,  or gallops. Abdomen: Abdomen is distended with generalized tenderness to palpation and guarding, no rebound G-tube in place, bowel sounds present , no peritoneal signs   Lab Results: Recent Labs    06/25/23 0604  NA 141  K 3.5  CL 106  CO2 24  GLUCOSE 151*  BUN 34*  CREATININE 0.68  CALCIUM 8.6*   Recent Labs    06/25/23 0604  AST 37  ALT 63*  ALKPHOS 48  BILITOT 0.3  PROT 5.9*  ALBUMIN 3.0*   Recent Labs    06/25/23 2104 06/26/23 0707  WBC 12.4* 12.7*  HGB 8.0* 7.8*  HCT 25.4* 24.6*  MCV 84.7 84.2  PLT 282 280   Recent Labs    06/26/23 0707  LABPROT 16.3*  INR 1.3*      Assessment/Plan: -Colonic ileus likely from narcotic use.  Having bowel movement with fleets enema. -Newly diagnosed esophageal cancer 05/28/2023.  Has a J-tube for feeding.  Has limited oral intake. -Anemia with occult blood positive.  No overt bleeding.  Hemoglobin was down to 5.3.  S/p units of blood transfusion.Recommendations --------------------------  -Continue fleets enema ,MiraLAX twice a day and Senokot twice a day -Patient with ongoing abdominal distention, abdominal pain as well as generalized tenderness in guarding on physical exam today.  Get CT  abdomen pelvis with IV contrast. -Encourage patient to take medications as prescribed. -Discussed with RN.  GI will follow.    Kathi Der MD, FACP 06/26/2023, 10:29 AM  Contact #  [phone]

## 2023-06-26 NOTE — Progress Notes (Signed)
Patient demanded to be disconnected from Tube Feeding. Patient educated on benefits of Tube Feeding. He stated that it was his right and he was told that he could stop the Tube Feeding at will.

## 2023-06-26 NOTE — Progress Notes (Signed)
PROGRESS NOTE    Jesus Haynes  ZOX:[id number] DOB: August 09, 1975 DOA: 06/14/2023 PCP: Ivonne Andrew, NP    Brief Narrative:  47 y.o. male with medical history significant of asthma, adenocarcinoma of the gastroesophageal junction, cancer associated pain, history of GI bleed, anemia due to chronic blood loss was sent from the cancer center after he had a near syncopal episode while they were drawing multiple vials of blood.  Patient was then admitted hospital for further evaluation and treatment.  Assessment and Plan:  Failure to thrive in adult secondary to adenocarcinoma of gastroesophageal junction (HCC) Cancer associated pain -Patient underwent feeding jejunostomy tube due to inadequate oral intake and pain.  Currently on IV Dilaudid and methadone.  Palliative care on board for pain management as well.  GI following.  Colonic ileus GI and general surgery on board.  Plan is Fleet enema.   Pre-syncope   2D echocardiogram with LV ejection fraction of 60 to 65%.  No acute issues.   Anemia due to chronic GI blood loss Hemoglobin down to 5.2 on 12/ 24/24 and has received 2 units of packed RBC.  Hemoglobin today at 7.8.  No obvious bleeding reported.    Asthma Continue albuterol.  Appears compensated.   Hypokalemia Continue to replenish as necessary.  Latest potassium was 3.5.       DVT prophylaxis: SCDs Start: 06/14/23 1621   Code Status:     Code Status: Full Code  Disposition: Home likely in 2 to 3 days when adequately pain controlled  Status is: Inpatient  Remains inpatient appropriate because: Inadequate pain control, IV narcotics,   Family Communication: None at bedside  Consultants:  Palliative care GI General surgery  Procedures:  Feeding jejunostomy  Antimicrobials:  None  Anti-infectives (From admission, onward)    Start     Dose/Rate Route Frequency Ordered Stop   06/18/23 1500  ceFAZolin (ANCEF) IVPB 2g/100 mL premix        2 g 200 mL/hr over  30 Minutes Intravenous On call to O.R. 06/18/23 1446 06/18/23 1600        Subjective: Today, patient was seen and examined at bedside.  Patient states that he has a little bit of everything feels stated.  Feels nauseous has been drinking some liquid.  Intermittently having abdominal discomfort and some bowel movement as well.  Denies any shortness of breath cough fever.  Objective: Vitals:   06/25/23 1648 06/25/23 1851 06/25/23 2315 06/26/23 0534  BP: 117/75 125/75 109/82   Pulse: 79 80 84   Resp: 18 18 16    Temp: 98.6 F (37 C) 98.1 F (36.7 C) 98.1 F (36.7 C)   TempSrc: Oral Oral Oral   SpO2: 95% 96% 95%   Weight:    80.3 kg  Height:    5\' 6"  (1.676 m)    Intake/Output Summary (Last 24 hours) at 06/26/2023 0925 Last data filed at 06/26/2023 0103 Gross per 24 hour  Intake 1985.33 ml  Output 200 ml  Net 1785.33 ml   Filed Weights   06/24/23 0455 06/25/23 0500 06/26/23 0534  Weight: 81.1 kg 79.6 kg 80.3 kg    Physical Examination: Body mass index is 28.57 kg/m.  General:  Average built, not in obvious distress HENT:   No scleral pallor or icterus noted. Oral mucosa is moist.  Chest:  Diminished breath sounds bilaterally. No crackles or wheezes.  CVS: S1 &S2 heard. No murmur.  Regular rate and rhythm. Abdomen: Soft, abdominal scar with jejunostomy tube in  place, nonspecific tenderness noted.  Bowel sounds are heard.   Extremities: No cyanosis, clubbing or edema.  Peripheral pulses are palpable. Psych: Alert, awake and oriented, flat affect, anxious, CNS:  No cranial nerve deficits.  Power equal in all extremities.   Skin: Warm and dry.  No rashes noted.  Data Reviewed:   CBC: Recent Labs  Lab 06/20/23 0649 06/25/23 0743 06/25/23 2104 06/26/23 0707  WBC 9.9 11.8* 12.4* 12.7*  HGB 8.7* 5.3* 8.0* 7.8*  HCT 28.7* 18.0* 25.4* 24.6*  MCV 80.8 82.6 84.7 84.2  PLT 312 283 282 280    Basic Metabolic Panel: Recent Labs  Lab 06/20/23 0649 06/21/23 0541  06/22/23 0744 06/25/23 0604  NA 138 137  --  141  K 3.3* 3.8  --  3.5  CL 107 105  --  106  CO2 22 24  --  24  GLUCOSE 113* 135*  --  151*  BUN 21* 33*  --  34*  CREATININE 0.53* 0.71  --  0.68  CALCIUM 8.6* 8.6*  --  8.6*  MG 2.0 2.4 2.3  --   PHOS 2.7 3.4 3.2  --     Liver Function Tests: Recent Labs  Lab 06/20/23 0649 06/25/23 0604  AST 18 37  ALT 20 63*  ALKPHOS 49 48  BILITOT 0.8 0.3  PROT 6.5 5.9*  ALBUMIN 3.2* 3.0*     Radiology Studies: DG Abd Portable 1V-Small Bowel Obstruction Protocol-initial, 8 hr delay Result Date: 06/25/2023 CLINICAL DATA:  8 hour delay for small bowel obstruction EXAM: PORTABLE ABDOMEN - 1 VIEW COMPARISON:  06/21/2023 FINDINGS: On the 8 hour image, there is contrast throughout the colon into the rectum. Continued gaseous distention of the colon. No small bowel dilatation. Change in ostomy catheter again noted in the left abdomen. IMPRESSION: Contrast throughout the colon into the rectum. No evidence of small bowel obstruction. Electronically Signed   By: Charlett Nose M.D.   On: 06/25/2023 00:16      LOS: 12 days    Joycelyn Das, MD [facility] Available via Epic secure chat 7am-7pm After these hours, please refer to coverage provider listed on [email or web address] 06/26/2023, 9:25 AM

## 2023-06-26 NOTE — Progress Notes (Signed)
Patient refused vitals and care in the am.

## 2023-06-27 ENCOUNTER — Ambulatory Visit: Payer: Self-pay

## 2023-06-27 DIAGNOSIS — R627 Adult failure to thrive: Secondary | ICD-10-CM | POA: Diagnosis not present

## 2023-06-27 LAB — BASIC METABOLIC PANEL
Anion gap: 9 (ref 5–15)
BUN: 26 mg/dL — ABNORMAL HIGH (ref 6–20)
CO2: 22 mmol/L (ref 22–32)
Calcium: 8.5 mg/dL — ABNORMAL LOW (ref 8.9–10.3)
Chloride: 103 mmol/L (ref 98–111)
Creatinine, Ser: 0.58 mg/dL — ABNORMAL LOW (ref 0.61–1.24)
GFR, Estimated: >60 mL/min (ref >60)
Glucose, Bld: 159 mg/dL — ABNORMAL HIGH (ref 70–99)
Potassium: 3.7 mmol/L (ref 3.5–5.1)
Sodium: 134 mmol/L — ABNORMAL LOW (ref 135–145)

## 2023-06-27 LAB — CBC
HCT: 27.8 % — ABNORMAL LOW (ref 39.0–52.0)
Hemoglobin: 8.4 g/dL — ABNORMAL LOW (ref 13.0–17.0)
MCH: 26.3 pg (ref 26.0–34.0)
MCHC: 30.2 g/dL (ref 30.0–36.0)
MCV: 86.9 fL (ref 80.0–100.0)
Platelets: 316 10*3/uL (ref 150–400)
RBC: 3.2 MIL/uL — ABNORMAL LOW (ref 4.22–5.81)
RDW: 16.3 % — ABNORMAL HIGH (ref 11.5–15.5)
WBC: 13.2 10*3/uL — ABNORMAL HIGH (ref 4.0–10.5)
nRBC: 0.5 % — ABNORMAL HIGH (ref 0.0–0.2)

## 2023-06-27 LAB — MAGNESIUM: Magnesium: 2.4 mg/dL (ref 1.7–2.4)

## 2023-06-27 NOTE — Progress Notes (Signed)
9 Days Post-Op   Subjective/Chief Complaint: Pt with min bowel activity, passing gas CT results reviewed   Objective: Vital signs in last 24 hours: Temp:  [97.7 F (36.5 C)-98.3 F (36.8 C)] 98.3 F (36.8 C) (12/26 0614) Pulse Rate:  [78-81] 78 (12/26 0614) Resp:  [18-20] 18 (12/26 3474) BP: (110-128)/(82-87) 110/82 (12/26 0614) SpO2:  [95 %-100 %] 96 % (12/26 0614) Last BM Date : 06/24/23  Intake/Output from previous day: 12/25 0701 - 12/26 0700 In: 70  Out: -  Intake/Output this shift: No intake/output data recorded.  PE:  Constitutional: No acute distress, conversant, appears states age. Eyes: Anicteric sclerae, moist conjunctiva, no lid lag Lungs: Clear to auscultation bilaterally, normal respiratory effort CV: regular rate and rhythm, no murmurs, no peripheral edema, pedal pulses 2+ GI: Soft, no masses or hepatosplenomegaly, non-tender to palpation, inc c/d/I  Skin: No rashes, palpation reveals normal turgor Psychiatric: appropriate judgment and insight, oriented to person, place, and time  Lab Results:  Recent Labs    06/26/23 0707 06/27/23 0530  WBC 12.7* 13.2*  HGB 7.8* 8.4*  HCT 24.6* 27.8*  PLT 280 316   BMET Recent Labs    06/25/23 0604 06/27/23 0530  NA 141 134*  K 3.5 3.7  CL 106 103  CO2 24 22  GLUCOSE 151* 159*  BUN 34* 26*  CREATININE 0.68 0.58*  CALCIUM 8.6* 8.5*   PT/INR Recent Labs    06/26/23 0707  LABPROT 16.3*  INR 1.3*   ABG No results for input(s): "PHART", "HCO3" in the last 72 hours.  Invalid input(s): "PCO2", "PO2"  Studies/Results: CT ABDOMEN PELVIS W CONTRAST Result Date: 06/26/2023 CLINICAL DATA:  History of gastroesophageal junction tumor with acute abdominal pain status post J-tube placement for failure to thrive. * Tracking Code: BO * EXAM: CT ABDOMEN AND PELVIS WITH CONTRAST TECHNIQUE: Multidetector CT imaging of the abdomen and pelvis was performed using the standard protocol following bolus administration  of intravenous contrast. RADIATION DOSE REDUCTION: This exam was performed according to the departmental dose-optimization program which includes automated exposure control, adjustment of the mA and/or kV according to patient size and/or use of iterative reconstruction technique. CONTRAST:  OMNIPAQUE IOHEXOL 300 MG/ML  SOLN COMPARISON:  Nuclear medicine PET dated 06/07/2023, CT abdomen and pelvis dated 05/28/2023 FINDINGS: Lower chest: Bilateral lower lobe subsegmental atelectasis. New trace right pleural effusion. Partially imaged heart size is normal. Hepatobiliary: Scattered hepatic hypodensities measuring up to 12 mm in segment 5/6 (2:28), likely cysts. No intra or extrahepatic biliary ductal dilation. Normal gallbladder. Pancreas: Diffuse thickening of the pancreas is again seen with heterogeneous enhancement, inseparable from hypoenhancing mass extending inferiorly from the gastric cardia into the pancreatic neck/body. In conglomerate, this area measures approximately 5.4 x 4.6 cm (2:26), previously 4.8 by 3.6 cm. Spleen: Normal in size without focal abnormality. Adrenals/Urinary Tract: No adrenal nodules. No suspicious renal mass, calculi or hydronephrosis. No focal bladder wall thickening. Stomach/Bowel: Infiltrative soft tissue mass of the gastroesophageal junction in keeping with known malignancy. With tip terminating in the right upper quadrant. The small bowel along the jejunostomy catheter is decompressed. Enteric contrast material is present throughout the colon. The ascending colon is diffusely dilated. Normal appendix. 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 Aortic atherosclerosis. Multi station upper abdominal and retroperitoneal lymphadenopathy is again seen. A few lymph nodes are markedly increased in size, for example 14 mm periportal (2:25), previously 7 mm and 17 mm anterior midline mesenteric (2:22), previously 11  mm, and others similar to  minimally increased in size, for example for example 12 mm paraesophageal (2:12), previously 10 mm and 10 mm right retrocrural (2:23), unchanged. Reproductive: Prostate is unremarkable. Other: Trace ascites.  No free air or fluid collection. Musculoskeletal: No acute or abnormal lytic or blastic osseous lesions. Small fat-containing paraumbilical and bilateral inguinal hernias. IMPRESSION: 1. Infiltrative soft tissue mass of the gastroesophageal junction in keeping with known malignancy with interval increase in size of hypoenhancing mass extending inferiorly from the gastric cardia into the pancreatic neck/body. 2. Interval placement of jejunostomy catheter with tip terminating in the right upper quadrant. The small bowel along the jejunostomy catheter is decompressed and not well evaluated. The ascending colon is diffusely dilated, likely ileus. 3. Interval increase in size of multi station upper abdominal and retroperitoneal lymphadenopathy, in keeping with metastatic disease. 4. New trace right pleural effusion. 5. Aortic Atherosclerosis (ICD10-I70.0). Electronically Signed   By: Agustin Cree M.D.   On: 06/26/2023 18:01    Anti-infectives: Anti-infectives (From admission, onward)    Start     Dose/Rate Route Frequency Ordered Stop   06/18/23 1500  ceFAZolin (ANCEF) IVPB 2g/100 mL premix        2 g 200 mL/hr over 30 Minutes Intravenous On call to O.R. 06/18/23 1446 06/18/23 1600       Assessment/Plan: 47 yo male with a locally advanced GE junction cancer, with dysphagia and weight loss.  POD#9 s/p placement of feeding J tube on 06/18/23. -Continue trialing J-tube feeds as tolerated.  Pt with colonic dysmotility liekly 2/2/ to narcs - No crushed medications are to be given via J tube, as this will clog the tube. Liquid medications only. - Pain management per palliative care - Diet as tolerated by mouth - Ok for patient to shower over incision and feeding tube. - TOC consulted for home feeding  supplies. Medicaid application pending. - Pt would benefit from mobilizaiton - not much to add from surgery perspective at this point   Surgery team will follow peripherally.  LOS: 13 days    Axel Filler 06/27/2023

## 2023-06-27 NOTE — Progress Notes (Signed)
PROGRESS NOTE    Jesus Haynes  AVW:[id number] DOB: 01-Mar-1976 DOA: 06/14/2023 PCP: Ivonne Andrew, NP    Brief Narrative:  47 y.o. male with medical history significant of asthma, adenocarcinoma of the gastroesophageal junction, cancer associated pain, history of GI bleed, anemia due to chronic blood loss was sent from the cancer center after he had a near syncopal episode while they were drawing multiple vials of blood.  Patient was then admitted hospital for further evaluation and treatment.  Assessment and Plan:  Failure to thrive in adult secondary to adenocarcinoma of gastroesophageal junction (HCC) Cancer associated pain -Patient underwent feeding jejunostomy tube due to inadequate oral intake and pain.  Currently on IV Dilaudid and methadone.  Palliative care on board for pain management as well.  GI following.  Still continues to have bowel pain and not feeling well.  On full liquids as tolerated.  Colonic ileus GI and general surgery on board.  Plan is Fleet enema.  CT scan done 06/26/2023 with progressive esophageal cancer and ascending colon ileus.  Patient did have a bowel movement   Pre-syncope   2D echocardiogram with LV ejection fraction of 60 to 65%.  No acute issues.   Anemia due to chronic GI blood loss Hemoglobin down to 5.2 on 12/ 24/24 and has received 2 units of packed RBC.  Hemoglobin today at 8.4.     Asthma Continue albuterol.  Appears compensated.   Hypokalemia Improved after replacement.  Latest potassium 3.7.       DVT prophylaxis: SCDs Start: 06/14/23 1621   Code Status:     Code Status: Full Code  Disposition: Home likely in 2 to 3 days when adequately pain controlled  Status is: Inpatient  Remains inpatient appropriate because: Inadequate pain control, IV narcotics, progressive cancer, debility   Family Communication: None at bedside  Consultants:  Palliative care GI General surgery  Procedures:  Feeding  jejunostomy  Antimicrobials:  None  Anti-infectives (From admission, onward)    Start     Dose/Rate Route Frequency Ordered Stop   06/18/23 1500  ceFAZolin (ANCEF) IVPB 2g/100 mL premix        2 g 200 mL/hr over 30 Minutes Intravenous On call to O.R. 06/18/23 1446 06/18/23 1600        Subjective: Today, patient was seen and examined at bedside.  Patient states that he did have a bowel movement.  Feels mildly nauseated.  Denies any shortness of breath cough fever chills.    Objective: Vitals:   06/26/23 0534 06/26/23 1340 06/26/23 2115 06/27/23 0614  BP:  110/82 128/87 110/82  Pulse:  80 81 78  Resp:  20 18 18   Temp:  97.7 F (36.5 C) 98.1 F (36.7 C) 98.3 F (36.8 C)  TempSrc:  Oral Oral Oral  SpO2:  95% 100% 96%  Weight: 80.3 kg     Height: 5\' 6"  (1.676 m)      No intake or output data in the 24 hours ending 06/27/23 0931  Filed Weights   06/24/23 0455 06/25/23 0500 06/26/23 0534  Weight: 81.1 kg 79.6 kg 80.3 kg    Physical Examination: Body mass index is 28.57 kg/m.   General:  Average built, not in obvious distress, appears mildly anxious, HENT:   No scleral pallor or icterus noted. Oral mucosa is moist.  Chest:  Diminished breath sounds bilaterally. No crackles or wheezes.  CVS: S1 &S2 heard. No murmur.  Regular rate and rhythm. Abdomen: Soft, abdominal scar with jejunostomy  tube in place, nonspecific tenderness noted.  Bowel sounds are heard.   Extremities: No cyanosis, clubbing or edema.  Peripheral pulses are palpable. Psych: Alert, awake and oriented, flat affect, anxious, CNS:  No cranial nerve deficits.  Power equal in all extremities.   Skin: Warm and dry.  No rashes noted.  Data Reviewed:   CBC: Recent Labs  Lab 06/25/23 0743 06/25/23 2104 06/26/23 0707 06/27/23 0530  WBC 11.8* 12.4* 12.7* 13.2*  HGB 5.3* 8.0* 7.8* 8.4*  HCT 18.0* 25.4* 24.6* 27.8*  MCV 82.6 84.7 84.2 86.9  PLT 283 282 280 316    Basic Metabolic Panel: Recent Labs   Lab 06/21/23 0541 06/22/23 0744 06/25/23 0604 06/27/23 0530  NA 137  --  141 134*  K 3.8  --  3.5 3.7  CL 105  --  106 103  CO2 24  --  24 22  GLUCOSE 135*  --  151* 159*  BUN 33*  --  34* 26*  CREATININE 0.71  --  0.68 0.58*  CALCIUM 8.6*  --  8.6* 8.5*  MG 2.4 2.3  --  2.4  PHOS 3.4 3.2  --   --     Liver Function Tests: Recent Labs  Lab 06/25/23 0604  AST 37  ALT 63*  ALKPHOS 48  BILITOT 0.3  PROT 5.9*  ALBUMIN 3.0*     Radiology Studies: CT ABDOMEN PELVIS W CONTRAST Result Date: 06/26/2023 CLINICAL DATA:  History of gastroesophageal junction tumor with acute abdominal pain status post J-tube placement for failure to thrive. * Tracking Code: BO * EXAM: CT ABDOMEN AND PELVIS WITH CONTRAST TECHNIQUE: Multidetector CT imaging of the abdomen and pelvis was performed using the standard protocol following bolus administration of intravenous contrast. RADIATION DOSE REDUCTION: This exam was performed according to the departmental dose-optimization program which includes automated exposure control, adjustment of the mA and/or kV according to patient size and/or use of iterative reconstruction technique. CONTRAST:  OMNIPAQUE IOHEXOL 300 MG/ML  SOLN COMPARISON:  Nuclear medicine PET dated 06/07/2023, CT abdomen and pelvis dated 05/28/2023 FINDINGS: Lower chest: Bilateral lower lobe subsegmental atelectasis. New trace right pleural effusion. Partially imaged heart size is normal. Hepatobiliary: Scattered hepatic hypodensities measuring up to 12 mm in segment 5/6 (2:28), likely cysts. No intra or extrahepatic biliary ductal dilation. Normal gallbladder. Pancreas: Diffuse thickening of the pancreas is again seen with heterogeneous enhancement, inseparable from hypoenhancing mass extending inferiorly from the gastric cardia into the pancreatic neck/body. In conglomerate, this area measures approximately 5.4 x 4.6 cm (2:26), previously 4.8 by 3.6 cm. Spleen: Normal in size without focal  abnormality. Adrenals/Urinary Tract: No adrenal nodules. No suspicious renal mass, calculi or hydronephrosis. No focal bladder wall thickening. Stomach/Bowel: Infiltrative soft tissue mass of the gastroesophageal junction in keeping with known malignancy. With tip terminating in the right upper quadrant. The small bowel along the jejunostomy catheter is decompressed. Enteric contrast material is present throughout the colon. The ascending colon is diffusely dilated. Normal appendix. 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 Aortic atherosclerosis. Multi station upper abdominal and retroperitoneal lymphadenopathy is again seen. A few lymph nodes are markedly increased in size, for example 14 mm periportal (2:25), previously 7 mm and 17 mm anterior midline mesenteric (2:22), previously 11 mm, and others similar to minimally increased in size, for example for example 12 mm paraesophageal (2:12), previously 10 mm and 10 mm right retrocrural (2:23), unchanged. Reproductive: Prostate is unremarkable. Other: Trace ascites.  No free  air or fluid collection. Musculoskeletal: No acute or abnormal lytic or blastic osseous lesions. Small fat-containing paraumbilical and bilateral inguinal hernias. IMPRESSION: 1. Infiltrative soft tissue mass of the gastroesophageal junction in keeping with known malignancy with interval increase in size of hypoenhancing mass extending inferiorly from the gastric cardia into the pancreatic neck/body. 2. Interval placement of jejunostomy catheter with tip terminating in the right upper quadrant. The small bowel along the jejunostomy catheter is decompressed and not well evaluated. The ascending colon is diffusely dilated, likely ileus. 3. Interval increase in size of multi station upper abdominal and retroperitoneal lymphadenopathy, in keeping with metastatic disease. 4. New trace right pleural effusion. 5. Aortic Atherosclerosis (ICD10-I70.0).  Electronically Signed   By: Agustin Cree M.D.   On: 06/26/2023 18:01      LOS: 13 days    Joycelyn Das, MD [facility] Available via Epic secure chat 7am-7pm After these hours, please refer to coverage provider listed on [email or web address] 06/27/2023, 9:31 AM

## 2023-06-27 NOTE — Plan of Care (Signed)
  Problem: Education: Goal: Knowledge of General Education information will improve Description: Including pain rating scale, medication(s)/side effects and non-pharmacologic comfort measures Outcome: Progressing   Problem: Health Behavior/Discharge Planning: Goal: Ability to manage health-related needs will improve Outcome: Progressing   Problem: Clinical Measurements: Goal: Ability to maintain clinical measurements within normal limits will improve Outcome: Progressing Goal: Will remain free from infection Outcome: Progressing Goal: Diagnostic test results will improve Outcome: Progressing Goal: Respiratory complications will improve Outcome: Progressing Goal: Cardiovascular complication will be avoided Outcome: Progressing   Problem: Activity: Goal: Risk for activity intolerance will decrease Outcome: Progressing   Problem: Coping: Goal: Level of anxiety will decrease Outcome: Progressing   Problem: Elimination: Goal: Will not experience complications related to bowel motility Outcome: Progressing Goal: Will not experience complications related to urinary retention Outcome: Progressing   Problem: Pain Management: Goal: General experience of comfort will improve Outcome: Progressing   Problem: Safety: Goal: Ability to remain free from injury will improve Outcome: Progressing   Problem: Skin Integrity: Goal: Risk for impaired skin integrity will decrease Outcome: Progressing

## 2023-06-27 NOTE — Plan of Care (Signed)

## 2023-06-27 NOTE — Progress Notes (Signed)
[facility] Gastroenterology Progress Note  Jesus Haynes 47 y.o. 1975/07/12   Subjective: Black stools this morning. Denies any worsened abdominal pain today.  Objective: Vital signs: Vitals:   06/26/23 2115 06/27/23 0614  BP: 128/87 110/82  Pulse: 81 78  Resp: 18 18  Temp: 98.1 F (36.7 C) 98.3 F (36.8 C)  SpO2: 100% 96%    Physical Exam: Gen: lethargic, thin, no acute distress, pleasant HEENT: anicteric sclera CV: RRR Chest: CTA B Abd: diffuse tenderness with guarding, distended, +BS, J-tube in place Ext: no edema  Lab Results: Recent Labs    06/25/23 0604 06/27/23 0530  NA 141 134*  K 3.5 3.7  CL 106 103  CO2 24 22  GLUCOSE 151* 159*  BUN 34* 26*  CREATININE 0.68 0.58*  CALCIUM 8.6* 8.5*  MG  --  2.4   Recent Labs    06/25/23 0604  AST 37  ALT 63*  ALKPHOS 48  BILITOT 0.3  PROT 5.9*  ALBUMIN 3.0*   Recent Labs    06/26/23 0707 06/27/23 0530  WBC 12.7* 13.2*  HGB 7.8* 8.4*  HCT 24.6* 27.8*  MCV 84.2 86.9  PLT 280 316      Assessment/Plan: Colonic ileus in the setting of metastatic GEJ cancer - bowels moving. J-tube for nutrition. CT negative for retroperitoneal bleed. Black stools likely due to GEJ cancer. Ileus is clinically improving. Refused Miralax. Continue supportive care. Will follow.   Shirley Friar 06/27/2023, 11:17 AM  Questions please call [phone] ID: [name], male   DOB: 1975-09-07, 47 y.o.   MRN: [id number]

## 2023-06-27 NOTE — Patient Outreach (Signed)
  Care Coordination   06/27/2023 Name: Jesus Haynes MRN: [id number] DOB: 1975/07/19   Care Coordination Outreach Attempts:  An unsuccessful outreach was attempted for an appointment today.  Follow Up Plan:  Additional outreach attempts will be made to offer the patient complex care management information and services.   Encounter Outcome:  No Answer   Care Coordination Interventions:  No, not indicated    SIG Lysle Morales, BSW Social Worker [phone]

## 2023-06-27 NOTE — Progress Notes (Signed)
Patient continues to refuse Miralax, Senokot, and tube feed overnight.

## 2023-06-27 NOTE — Progress Notes (Signed)
PT Cancellation Note  Patient Details Name: Jesus Haynes MRN: [id number] DOB: 1976/02/13   Cancelled Treatment:   Attempted to see three times today RN in room giving meds so try "later" "Not up to it" offering to amb in room only and to bathroom Family visiting and pt declined any OOB suggestion offered.   Felecia Shelling  PTA Acute  Rehabilitation Services Office M-F          [phone]

## 2023-06-28 ENCOUNTER — Encounter: Payer: Self-pay | Admitting: Oncology

## 2023-06-28 DIAGNOSIS — R627 Adult failure to thrive: Secondary | ICD-10-CM | POA: Diagnosis not present

## 2023-06-28 DIAGNOSIS — D500 Iron deficiency anemia secondary to blood loss (chronic): Secondary | ICD-10-CM | POA: Diagnosis not present

## 2023-06-28 DIAGNOSIS — C160 Malignant neoplasm of cardia: Secondary | ICD-10-CM | POA: Diagnosis not present

## 2023-06-28 DIAGNOSIS — Z934 Other artificial openings of gastrointestinal tract status: Secondary | ICD-10-CM

## 2023-06-28 LAB — BASIC METABOLIC PANEL
Anion gap: 9 (ref 5–15)
BUN: 25 mg/dL — ABNORMAL HIGH (ref 6–20)
CO2: 24 mmol/L (ref 22–32)
Calcium: 8.5 mg/dL — ABNORMAL LOW (ref 8.9–10.3)
Chloride: 101 mmol/L (ref 98–111)
Creatinine, Ser: 0.38 mg/dL — ABNORMAL LOW (ref 0.61–1.24)
GFR, Estimated: >60 mL/min (ref >60)
Glucose, Bld: 106 mg/dL — ABNORMAL HIGH (ref 70–99)
Potassium: 3.6 mmol/L (ref 3.5–5.1)
Sodium: 134 mmol/L — ABNORMAL LOW (ref 135–145)

## 2023-06-28 LAB — CBC
HCT: 24.2 % — ABNORMAL LOW (ref 39.0–52.0)
Hemoglobin: 7.6 g/dL — ABNORMAL LOW (ref 13.0–17.0)
MCH: 26.6 pg (ref 26.0–34.0)
MCHC: 31.4 g/dL (ref 30.0–36.0)
MCV: 84.6 fL (ref 80.0–100.0)
Platelets: 319 10*3/uL (ref 150–400)
RBC: 2.86 MIL/uL — ABNORMAL LOW (ref 4.22–5.81)
RDW: 16.5 % — ABNORMAL HIGH (ref 11.5–15.5)
WBC: 11.6 10*3/uL — ABNORMAL HIGH (ref 4.0–10.5)
nRBC: 0 % (ref 0.0–0.2)

## 2023-06-28 MED ORDER — BOOST / RESOURCE BREEZE PO LIQD CUSTOM
1.0000 | Freq: Three times a day (TID) | ORAL | Status: DC
  Administered 2023-06-29 (×2): 1 via ORAL

## 2023-06-28 MED ORDER — OSMOLITE 1.5 CAL PO LIQD
1000.0000 mL | ORAL | Status: DC
Start: 2023-06-28 — End: 2023-06-30
  Filled 2023-06-28 (×3): qty 1000

## 2023-06-28 NOTE — Progress Notes (Signed)
Physical Therapy Treatment Patient Details Name: Jesus Haynes MRN: [id number] DOB: Feb 17, 1976 Today's Date: 06/28/2023   History of Present Illness GURDON SCHWOERER is a 47 y.o. male presents with near syncopal episode while cancer center was drawing multiple vials of blood on 06/14/23. Pt s/p open placement of feeding jejunostomy tube 06/18/23.  PMH: asthma, adenocarcinoma gastroesophageal junction, cancer associated pain, history of GI bleed, melena, anemia due to chronic blood loss    PT Comments  Pt progressed and able to ambulate with PT.  Pt needs increased time to prepare/motivate for each transfer.  Ambulated 25' with RW and CGA, close chair follow, limited by nausea.  Reports plan to d/c to eldest daughter's home and that she works from home.  Will continue to progress as able. Recommend HHPT at d/c     If plan is discharge home, recommend the following: A little help with walking and/or transfers;A little help with bathing/dressing/bathroom;Assistance with cooking/housework;Assist for transportation   Can travel by private vehicle        Equipment Recommendations  Rolling walker (2 wheels)    Recommendations for Other Services       Precautions / Restrictions Precautions Precautions: Fall Precaution Comments: J tube     Mobility  Bed Mobility Overal bed mobility: Needs Assistance Bed Mobility: Sidelying to Sit   Sidelying to sit: Supervision, Used rails, HOB elevated       General bed mobility comments: Significantly increased time to initiate with 3 attempts to complete but no assist needed    Transfers Overall transfer level: Needs assistance Equipment used: Rolling walker (2 wheels) Transfers: Sit to/from Stand Sit to Stand: Contact guard assist           General transfer comment: Again increased time to motivate and initiated but then no assist. Pt had BM in bed , required assist for cleaning in standing - reports afraid to let go of RW to clean     Ambulation/Gait Ambulation/Gait assistance: Contact guard assist Gait Distance (Feet): 25 Feet Assistive device: Rolling walker (2 wheels) Gait Pattern/deviations: Step-to pattern Gait velocity: decreased     General Gait Details: ambulated 25' with close chair follow; reports "doesn't feel like legs under him."  Pt demonstrated steady balance, no buckling, and ambulated 25' with RW at decreased speed.  Further distance limited by nausea   Stairs             Wheelchair Mobility     Tilt Bed    Modified Rankin (Stroke Patients Only)       Balance Overall balance assessment: Needs assistance Sitting-balance support: No upper extremity supported Sitting balance-Leahy Scale: Good     Standing balance support: Bilateral upper extremity supported Standing balance-Leahy Scale: Poor Standing balance comment: steady with RW; stood for at least 2 mins for ADLs prior to ambulation                            Cognition Arousal: Alert Behavior During Therapy: Flat affect, Anxious Overall Cognitive Status: No family/caregiver present to determine baseline cognitive functioning                                 General Comments: Overall cognition appears intact - oriented, follows commands.  He just reports "not used to taking pain meds , so wipes him out."  Pt needing significantly increased time throughout session to "collect  self"/motivate        Exercises      General Comments General comments (skin integrity, edema, etc.): VSS, nauseated - RN notified      Pertinent Vitals/Pain Pain Assessment Pain Assessment: Faces Faces Pain Scale: Hurts a little bit Pain Location: abdomen Pain Descriptors / Indicators: Constant Pain Intervention(s): Limited activity within patient's tolerance, Monitored during session, Premedicated before session, Repositioned    Home Living                          Prior Function            PT  Goals (current goals can now be found in the care plan section) Progress towards PT goals: Progressing toward goals    Frequency    Min 1X/week      PT Plan      Co-evaluation              AM-PAC PT "6 Clicks" Mobility   Outcome Measure  Help needed turning from your back to your side while in a flat bed without using bedrails?: None Help needed moving from lying on your back to sitting on the side of a flat bed without using bedrails?: A Little Help needed moving to and from a bed to a chair (including a wheelchair)?: A Little Help needed standing up from a chair using your arms (e.g., wheelchair or bedside chair)?: A Little Help needed to walk in hospital room?: A Little Help needed climbing 3-5 steps with a railing? : A Lot 6 Click Score: 18    End of Session   Activity Tolerance: Other (comment) (limited by nausea) Patient left: in chair;with call bell/phone within reach Nurse Communication: Mobility status PT Visit Diagnosis: Muscle weakness (generalized) (M62.81);Pain     Time: 3664-4034 PT Time Calculation (min) (ACUTE ONLY): 29 min  Charges:    $Gait Training: 8-22 mins $Therapeutic Activity: 8-22 mins PT General Charges $$ ACUTE PT VISIT: 1 Visit                     Anise Salvo, PT Acute Rehab [facility] [phone]    Rayetta Humphrey 06/28/2023, 12:35 PM

## 2023-06-28 NOTE — Progress Notes (Addendum)
PROGRESS NOTE    Jesus Haynes  XLK:[id number] DOB: 03-18-1976 DOA: 06/14/2023 PCP: Ivonne Andrew, NP    Brief Narrative:   47 y.o. male with medical history significant of asthma, adenocarcinoma of the gastroesophageal junction, cancer associated pain, history of GI bleed, anemia due to chronic blood loss was sent from the cancer center after he had a near syncopal episode while they were drawing multiple vials of blood.  Patient was then admitted hospital for further evaluation and treatment.  During hospitalization patient did have a prolonged course due to ongoing pain and palliative care was involved.  He subsequently underwent feeding jejunostomy due to decreased tolerance orally.  Assessment and Plan:  Failure to thrive in adult secondary to adenocarcinoma of gastroesophageal junction (HCC) Cancer associated pain Patient underwent feeding jejunostomy tube due to inadequate oral intake and pain.  Currently on IV Dilaudid and methadone.  Palliative care on board for pain management as well.  GI following.  Tube feeding were initiated but had been refusing it.  Counseled the patient about it.  Surgery recommends tube feeding to improve his nutrition.  Dietary has seen the patient and my impression is that trying 20 mL/h continuous feed on discharge.  Continue stool softeners  Colonic ileus GI and general surgery on board.  Received Fleet enema..  CT scan done 06/26/2023 with progressive esophageal cancer and ascending colon ileus.  Patient did have a bowel movement.  Check abdominal x-ray in a.m.   Pre-syncope   2D echocardiogram with LV ejection fraction of 60 to 65%.  No acute issues.   Anemia due to chronic GI blood loss Hemoglobin down to 5.2 on 12/ 24/24 and has received 2 units of packed RBC.  Hemoglobin today at 7.6.    Asthma Continue albuterol.  Appears compensated.   Hypokalemia Improved after replacement.  Latest potassium 3.6.  Debility weakness deconditioning.   Had been refusing PT but was able to walk around 25 feet with rolling walker today.  Plan is to stay with his older daughter on discharge.  Would benefit from home health PT on discharge.      DVT prophylaxis: SCDs Start: 06/14/23 1621   Code Status:     Code Status: Full Code  Disposition: Home with home health likely in 1 to 2 days, TOC involved in transition.  Status is: Inpatient  Remains inpatient appropriate because: Inadequate pain control, IV narcotics, progressive cancer, debility, need for tube feeding.   Family Communication: None at bedside, spoke with patient's mother-in-law 06/15/2023, unable to reach her today.  I tried to reach the patient's friend listed in the computer but was unable to reach her as well.  Consultants:  Palliative care GI General surgery  Procedures:  Feeding jejunostomy  Antimicrobials:  None  Anti-infectives (From admission, onward)    Start     Dose/Rate Route Frequency Ordered Stop   06/18/23 1500  ceFAZolin (ANCEF) IVPB 2g/100 mL premix        2 g 200 mL/hr over 30 Minutes Intravenous On call to O.R. 06/18/23 1446 06/18/23 1600       Subjective: Today, patient was seen and examined at bedside.  Patient states that he feels a little dizzy and lightheaded.  Nursing staff reported that he had been refusing tube feeding.  Encouraged him to ambulate and use tube feeding.  Has intermittent discomfort in the abdomen.  Had bowel movement yesterday.  Objective: Vitals:   06/27/23 1411 06/27/23 2022 06/28/23 0529 06/28/23 1518  BP:  115/72 116/82 135/76 128/81  Pulse: 72 82 81 87  Resp: 20 18 18 20   Temp: 98.3 F (36.8 C) 98.1 F (36.7 C) 98 F (36.7 C) 98 F (36.7 C)  TempSrc: Oral Oral Oral Oral  SpO2: 97% 95% 98% 95%  Weight:      Height:        Intake/Output Summary (Last 24 hours) at 06/28/2023 1624 Last data filed at 06/28/2023 1530 Gross per 24 hour  Intake 270 ml  Output 1 ml  Net 269 ml    Filed Weights    06/24/23 0455 06/25/23 0500 06/26/23 0534  Weight: 81.1 kg 79.6 kg 80.3 kg    Physical Examination: Body mass index is 28.57 kg/m.   General:  Average built, not in obvious distress, appears mildly anxious, HENT:   No scleral pallor or icterus noted. Oral mucosa is moist.  Chest:  Diminished breath sounds bilaterally. No crackles or wheezes.  CVS: S1 &S2 heard. No murmur.  Regular rate and rhythm. Abdomen: Soft, abdominal scar with jejunostomy tube in place, nonspecific tenderness noted.  Bowel sounds are heard.   Extremities: No cyanosis, clubbing or edema.  Peripheral pulses are palpable. Psych: Alert, awake and oriented, flat affect, anxious, CNS:  No cranial nerve deficits.  Moves all extremities. Skin: Warm and dry.  No rashes noted.  Data Reviewed:   CBC: Recent Labs  Lab 06/25/23 0743 06/25/23 2104 06/26/23 0707 06/27/23 0530 06/28/23 0523  WBC 11.8* 12.4* 12.7* 13.2* 11.6*  HGB 5.3* 8.0* 7.8* 8.4* 7.6*  HCT 18.0* 25.4* 24.6* 27.8* 24.2*  MCV 82.6 84.7 84.2 86.9 84.6  PLT 283 282 280 316 319    Basic Metabolic Panel: Recent Labs  Lab 06/22/23 0744 06/25/23 0604 06/27/23 0530 06/28/23 0523  NA  --  141 134* 134*  K  --  3.5 3.7 3.6  CL  --  106 103 101  CO2  --  24 22 24   GLUCOSE  --  151* 159* 106*  BUN  --  34* 26* 25*  CREATININE  --  0.68 0.58* 0.38*  CALCIUM  --  8.6* 8.5* 8.5*  MG 2.3  --  2.4  --   PHOS 3.2  --   --   --     Liver Function Tests: Recent Labs  Lab 06/25/23 0604  AST 37  ALT 63*  ALKPHOS 48  BILITOT 0.3  PROT 5.9*  ALBUMIN 3.0*     Radiology Studies: No results found.     LOS: 14 days    Joycelyn Das, MD [facility] Available via Epic secure chat 7am-7pm After these hours, please refer to coverage provider listed on [email or web address] 06/28/2023, 4:24 PM

## 2023-06-28 NOTE — Progress Notes (Signed)
Chaplain met with Jesus Haynes to attempt notarizing advance directives.  He had just had medication and did not feel up to completing that at this time. Chaplain let him know that if he discharges before we complete the notarizing, that he can bring the paperwork to an appointment at the cancer center and have it completed there.

## 2023-06-28 NOTE — Progress Notes (Signed)
Nutrition Follow-up  DOCUMENTATION CODES:   Non-severe (moderate) malnutrition in context of chronic illness  INTERVENTION:  - Osmolite 1.5 at 63mL/hr, restart today pending patient's acceptance.  + 30mL Q6H FWF  - Once tolerating at 80mL/hr, can consider advancing by 10mL Q24H to goal rate of 38mL/hr.   - Goal of 75mL/hr provides 2520 kcals, 105g protein, and free water  - Monitor magnesium, potassium, and phosphorus BID for at least 3 days, MD to replete as needed, as pt is at risk for refeeding syndrome.   - Full Liquid diet per MD.  - Boost Breeze po TID, each supplement provides 250 kcal and 9 grams of protein  - Monitor weight trends.  NUTRITION DIAGNOSIS:   Moderate Malnutrition related to chronic illness, cancer and cancer related treatments as evidenced by energy intake < or equal to 75% for > or equal to 1 month, mild fat depletion, percent weight loss. *ongoing  GOAL:   Patient will meet greater than or equal to 90% of their needs *progressing  MONITOR:   Labs, Weight trends, I & O's, PO intake, Supplement acceptance, TF tolerance  REASON FOR ASSESSMENT:   Consult, Malnutrition Screening Tool (from [facility])    ASSESSMENT:   47 y.o. male with medical history significant of asthma, adenocarcinoma of the gastroesophageal junction, cancer associated pain, history of GI bleed, anemia due to chronic blood loss was sent from the cancer center after he had a near syncopal episode while they were drawing multiple vials of blood.  12/13: admitted 12/17: s/p open J-tube placement 12/20: abdominal xray -> ileus  Patient reports he has not gotten tube feeds in at last 24 hours. Per MAR, wanted tube feeds stopped at 0100 on 12/25 and has not received tube feeds since that time.   He reports his abdominal pain is somewhat improved as he has been having bowel movements the past few days. Last BM yesterday evening.  Patient states his biggest concern with  restarting is that he will get push back from nursing again if he wants them stopped as this is what happened on 12/25.  Reassured patient tube feeds could be stopped if he begins to have too much pain. Discussed restart feeds at 59mL/hr and keeping them at that rate until tolerating and then trying to advance to goal from there. Patient agreeable to plan.  He reports taking in some oral intake, mostly juice and some soda. He is open to receiving Boost Breeze to support oral intake.   Patient being followed by GI and Surgery. GI note yesterday states ileus is improving.  Discussed restarting back at 36mL/hr and Surgery/GI in agreement. Can trail 10mL Q24H advancements once patient tolerating/accepting of tube feeds at 50mL/hr.    Admit weight: 177# Current weight: 177# I&O's: +4L  Medications reviewed and include: 30mL Q6H FWF, Miralax BID, Senokot BID, Fleet enema daily  Labs reviewed:  Na 134   Diet Order:   Diet Order             Diet full liquid Fluid consistency: Thin  Diet effective now                   EDUCATION NEEDS:  Education needs have been addressed  Skin:  Skin Assessment: Skin Integrity Issues: Skin Integrity Issues:: Incisions Incisions: 12/17 abdomen  Last BM:  12/26 - type 7  Height:  Ht Readings from Last 1 Encounters:  06/26/23 5\' 6"  (1.676 m)   Weight:  Wt Readings  from Last 1 Encounters:  06/26/23 80.3 kg   BMI:  Body mass index is 28.57 kg/m.  Estimated Nutritional Needs:  Kcal:  2200-2400 Protein:  105-115g Fluid:  2.2L/day    Shelle Iron RD, LDN Contact via Secure Chat.

## 2023-06-28 NOTE — Progress Notes (Signed)
[facility] Gastroenterology Progress Note  MOTT HUMBLE 47 y.o. 08/29/1975   Subjective: Denies abdominal pain. Denies BMs overnight. Reports passing gas.  Objective: Vital signs: Vitals:   06/27/23 2022 06/28/23 0529  BP: 116/82 135/76  Pulse: 82 81  Resp: 18 18  Temp: 98.1 F (36.7 C) 98 F (36.7 C)  SpO2: 95% 98%    Physical Exam: Gen: lethargic, no acute distress  HEENT: anicteric sclera CV: RRR Chest: CTA B Abd: diffusely tender with minimal guarding, J-tube noted, +BS Ext: no edema  Lab Results: Recent Labs    06/27/23 0530 06/28/23 0523  NA 134* 134*  K 3.7 3.6  CL 103 101  CO2 22 24  GLUCOSE 159* 106*  BUN 26* 25*  CREATININE 0.58* 0.38*  CALCIUM 8.5* 8.5*  MG 2.4  --    No results for input(s): "AST", "ALT", "ALKPHOS", "BILITOT", "PROT", "ALBUMIN" in the last 72 hours. 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 Loose stool yesterday morning. J-tube in place. Check abd Xray tomorrow morning. Supportive care. Will f/u.   Shirley Friar 06/28/2023, 11:19 AM  Questions please call [phone] ID: Jesus Haynes, male   DOB: 1975-09-11, 47 y.o.   MRN: [id number]

## 2023-06-28 NOTE — Progress Notes (Signed)
Pt refused tube feeding today. He was encouraged throughout the day to let us hook it up and has refused every time.

## 2023-06-28 NOTE — Progress Notes (Addendum)
10 Days Post-Op   Subjective/Chief Complaint: Pt with min bowel activity, passing gas,tiny black BM yesterday No major changes. States he has not had tube feeds in over 24 hours, does not want to try them this morning, says maybe later today.   Refusing miralax  Objective: Vital signs in last 24 hours: Temp:  [98 F (36.7 C)-98.3 F (36.8 C)] 98 F (36.7 C) (12/27 0529) Pulse Rate:  [72-82] 81 (12/27 0529) Resp:  [18-20] 18 (12/27 0529) BP: (115-135)/(72-82) 135/76 (12/27 0529) SpO2:  [95 %-98 %] 98 % (12/27 0529) Last BM Date : 06/27/23  Intake/Output from previous day: 12/26 0701 - 12/27 0700 In: 60  Out: 250 [Urine:250] Intake/Output this shift: No intake/output data recorded.  PE: Constitutional: No acute distress, conversant, appears states age. GI: soft, protuberant, mild tenderness central abdomin, surgical incisions c/d/I, J tube site clean and dry, clamped.  Lab Results:  Recent Labs    06/27/23 0530 06/28/23 0523  WBC 13.2* 11.6*  HGB 8.4* 7.6*  HCT 27.8* 24.2*  PLT 316 319   BMET Recent Labs    06/27/23 0530 06/28/23 0523  NA 134* 134*  K 3.7 3.6  CL 103 101  CO2 22 24  GLUCOSE 159* 106*  BUN 26* 25*  CREATININE 0.58* 0.38*  CALCIUM 8.5* 8.5*   PT/INR Recent Labs    06/26/23 0707  LABPROT 16.3*  INR 1.3*   ABG No results for input(s): "PHART", "HCO3" in the last 72 hours.  Invalid input(s): "PCO2", "PO2"  Studies/Results: CT ABDOMEN PELVIS W CONTRAST Result Date: 06/26/2023 CLINICAL DATA:  History of gastroesophageal junction tumor with acute abdominal pain status post J-tube placement for failure to thrive. * Tracking Code: BO * EXAM: CT ABDOMEN AND PELVIS WITH CONTRAST TECHNIQUE: Multidetector CT imaging of the abdomen and pelvis was performed using the standard protocol following bolus administration of intravenous contrast. RADIATION DOSE REDUCTION: This exam was performed according to the departmental dose-optimization program which  includes automated exposure control, adjustment of the mA and/or kV according to patient size and/or use of iterative reconstruction technique. CONTRAST:  OMNIPAQUE IOHEXOL 300 MG/ML  SOLN COMPARISON:  Nuclear medicine PET dated 06/07/2023, CT abdomen and pelvis dated 05/28/2023 FINDINGS: Lower chest: Bilateral lower lobe subsegmental atelectasis. New trace right pleural effusion. Partially imaged heart size is normal. Hepatobiliary: Scattered hepatic hypodensities measuring up to 12 mm in segment 5/6 (2:28), likely cysts. No intra or extrahepatic biliary ductal dilation. Normal gallbladder. Pancreas: Diffuse thickening of the pancreas is again seen with heterogeneous enhancement, inseparable from hypoenhancing mass extending inferiorly from the gastric cardia into the pancreatic neck/body. In conglomerate, this area measures approximately 5.4 x 4.6 cm (2:26), previously 4.8 by 3.6 cm. Spleen: Normal in size without focal abnormality. Adrenals/Urinary Tract: No adrenal nodules. No suspicious renal mass, calculi or hydronephrosis. No focal bladder wall thickening. Stomach/Bowel: Infiltrative soft tissue mass of the gastroesophageal junction in keeping with known malignancy. With tip terminating in the right upper quadrant. The small bowel along the jejunostomy catheter is decompressed. Enteric contrast material is present throughout the colon. The ascending colon is diffusely dilated. Normal appendix. 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 Aortic atherosclerosis. Multi station upper abdominal and retroperitoneal lymphadenopathy is again seen. A few lymph nodes are markedly increased in size, for example 14 mm periportal (2:25), previously 7 mm and 17 mm anterior midline mesenteric (2:22), previously 11 mm, and others similar to minimally increased in size, for example for example 12  mm paraesophageal (2:12), previously 10 mm and 10 mm right retrocrural (2:23),  unchanged. Reproductive: Prostate is unremarkable. Other: Trace ascites.  No free air or fluid collection. Musculoskeletal: No acute or abnormal lytic or blastic osseous lesions. Small fat-containing paraumbilical and bilateral inguinal hernias. IMPRESSION: 1. Infiltrative soft tissue mass of the gastroesophageal junction in keeping with known malignancy with interval increase in size of hypoenhancing mass extending inferiorly from the gastric cardia into the pancreatic neck/body. 2. Interval placement of jejunostomy catheter with tip terminating in the right upper quadrant. The small bowel along the jejunostomy catheter is decompressed and not well evaluated. The ascending colon is diffusely dilated, likely ileus. 3. Interval increase in size of multi station upper abdominal and retroperitoneal lymphadenopathy, in keeping with metastatic disease. 4. New trace right pleural effusion. 5. Aortic Atherosclerosis (ICD10-I70.0). Electronically Signed   By: Agustin Cree M.D.   On: 06/26/2023 18:01    Anti-infectives: Anti-infectives (From admission, onward)    Start     Dose/Rate Route Frequency Ordered Stop   06/18/23 1500  ceFAZolin (ANCEF) IVPB 2g/100 mL premix        2 g 200 mL/hr over 30 Minutes Intravenous On call to O.R. 06/18/23 1446 06/18/23 1600       Assessment/Plan: 47 yo male with a locally advanced GE junction cancer, with dysphagia and weight loss.  POD#10 s/p placement of feeding J tube on 06/18/23. -Continue trialing J-tube feeds as tolerated.  Pt with colonic dysmotility liekly 2/2 to narcs and malignancy. - No crushed medications are to be given via J tube, as this will clog the tube. Liquid medications only. - Pain management per palliative care - Diet as tolerated by mouth - Ok for patient to shower over incision and feeding tube. - TOC consulted for home feeding supplies. Medicaid application pending. - Pt would benefit from mobilizaiton - not much to add from surgery perspective  at this point   Surgery team will follow peripherally.  LOS: 14 days    Adam Phenix PA-C 06/28/2023

## 2023-06-29 ENCOUNTER — Inpatient Hospital Stay (HOSPITAL_COMMUNITY): Payer: Medicaid Other

## 2023-06-29 DIAGNOSIS — R627 Adult failure to thrive: Secondary | ICD-10-CM | POA: Diagnosis not present

## 2023-06-29 NOTE — Progress Notes (Signed)
[facility] Gastroenterology Progress Note  Jesus Haynes 47 y.o. [birth year]   Subjective: Small BM this morning. Denies abdominal pain. Unwilling to restart TFs yesterday stating that he does not want to restart TFs until he is able to go to the toilet without assistance.  Objective: Vital signs: Vitals:   06/29/23 0914 06/29/23 1129  BP: (!) 132/92 113/89  Pulse: 85 80  Resp: 16   Temp: 98.4 F (36.9 C)   SpO2: 97%     Physical Exam: Gen: lethargic, no acute distress  HEENT: anicteric sclera CV: RRR Chest: CTA B Abd: diffuse tenderness with guarding, mild distention, +BS, J-tube in place Ext: no edema  Lab Results: Recent Labs    06/27/23 0530 06/28/23 0523  NA 134* 134*  K 3.7 3.6  CL 103 101  CO2 22 24  GLUCOSE 159* 106*  BUN 26* 25*  CREATININE 0.58* 0.38*  CALCIUM 8.5* 8.5*  MG 2.4  --    No results for input(s): "AST", "ALT", "ALKPHOS", "BILITOT", "PROT", "ALBUMIN" in the last 72 hours. 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 Xray shows nonobstructive bowel gas pattern in the colon. Strongly encouraged him to resume tube feeds and need for nutrition to increase his strength. Despite this encouragement, he was not willing to restart them during my evaluation this morning. Continue supportive care.    Shirley Friar 06/29/2023, 12:22 PM  Questions please call [phone] ID: Jesus Haynes, male   DOB: Nov 12, 1975, 47 y.o.   MRN: [id number]

## 2023-06-29 NOTE — Progress Notes (Signed)
The patient has been out of bed to the chair four hours today. Activity tolerated moderately well.

## 2023-06-29 NOTE — Progress Notes (Signed)
PROGRESS NOTE    Jesus Haynes  JYN:[id number] DOB: Jun 29, 1976 DOA: 06/14/2023 PCP: Ivonne Andrew, NP    Brief Narrative:   47 y.o. male with medical history significant of asthma, adenocarcinoma of the gastroesophageal junction, cancer associated pain, history of GI bleed, anemia due to chronic blood loss was sent from the cancer center after he had a near syncopal episode while they were drawing multiple vials of blood.  Patient was then admitted to the hospital for further evaluation and treatment.  During hospitalization, patient did have a prolonged course due to ongoing pain and palliative care was involved.  He subsequently underwent feeding jejunostomy due to decreased tolerance orally but has been refusing tube feedings.  He has not been participating much with physical therapy as well due to multitude of symptoms.  Has been having multiple issues limiting his discharge and not making progress.  Assessment and Plan:  Failure to thrive in adult secondary to adenocarcinoma of gastroesophageal junction (HCC) Cancer associated pain Patient underwent feeding jejunostomy tube due to inadequate oral intake and pain.  Currently on IV Dilaudid and methadone.  Palliative care on board for pain management as well.  GI following.  Tube feeding were initiated but had been refusing it.  Multiple people talking about it.  He states that he should be able to go to the bathroom on his own before he could continue to use tube feedings..  Dietary board and plan is to continue low rate at 20 mL/h on discharge if patient would agree to it.  Colonic ileus GI and general surgery on board.  Received Fleet enema..  CT scan done 06/26/2023 with progressive esophageal cancer and ascending colon ileus.  GI following and at this time improving with conservative treatment and enema.   Pre-syncope   2D echocardiogram with LV ejection fraction of 60 to 65%.  No acute issues.   Anemia due to chronic GI blood  loss Hemoglobin down to 5.2 on 12/ 24/24 and has received 2 units of packed RBC.  Latest hemoglobin today at 7.6.  Check CBC in AM.    Asthma Continue albuterol.  Appears compensated.   Hypokalemia Improved after replacement.  Latest potassium 3.6.  Debility weakness deconditioning.  Had been refusing PT but was able to walk around 25 feet with rolling walker on 06/27/2021, plan is to stay with his older daughter on discharge.  Would benefit from home health PT on discharge.      DVT prophylaxis: SCDs Start: 06/14/23 1621   Code Status:     Code Status: Full Code  Disposition: Home with home health likely in 1 to 2 days, TOC involved in transition.  Status is: Inpatient  Remains inpatient appropriate because: I ongoing pain issues ambulatory issues and issues with tube feeding, debility, need for tube feeding.   Family Communication:  None at bedside, spoke with patient's mother-in-law 06/15/2023, unable to reach her again today   Consultants:  Palliative care GI General surgery  Procedures:  Feeding jejunostomy  Antimicrobials:  None  Anti-infectives (From admission, onward)    Start     Dose/Rate Route Frequency Ordered Stop   06/18/23 1500  ceFAZolin (ANCEF) IVPB 2g/100 mL premix        2 g 200 mL/hr over 30 Minutes Intravenous On call to O.R. 06/18/23 1446 06/18/23 1600       Subjective: Today, patient was seen and examined at bedside.  Patient states that he still feels dizzy lightheaded and unsafe when standing  up and does not wish to be hooked up with the tube feeding until he is ambulatory to the bathroom.  Has been refusing tube feeding until then.  Objective: Vitals:   06/29/23 0616 06/29/23 0914 06/29/23 1129 06/29/23 1309  BP: 111/74 (!) 132/92 113/89 124/74  Pulse: 94 85 80 77  Resp: 17 16  18   Temp: 98 F (36.7 C) 98.4 F (36.9 C)  98 F (36.7 C)  TempSrc: Oral Oral  Oral  SpO2: 96% 97%  93%  Weight:      Height:        Intake/Output  Summary (Last 24 hours) at 06/29/2023 1524 Last data filed at 06/29/2023 0617 Gross per 24 hour  Intake 660 ml  Output 250 ml  Net 410 ml    Filed Weights   06/24/23 0455 06/25/23 0500 06/26/23 0534  Weight: 81.1 kg 79.6 kg 80.3 kg    Physical Examination: Body mass index is 28.57 kg/m.   General:  Average built, not in obvious distress, appears anxious, HENT:   No scleral pallor or icterus noted. Oral mucosa is moist.  Chest:  Diminished breath sounds bilaterally. No crackles or wheezes.  CVS: S1 &S2 heard. No murmur.  Regular rate and rhythm. Abdomen: Soft, abdominal scar with jejunostomy tube in place, nonspecific tenderness noted.  Bowel sounds are heard.   Extremities: No cyanosis, clubbing or edema.  Peripheral pulses are palpable. Psych: Alert, awake and oriented, flat affect, anxious, CNS:  No cranial nerve deficits.  Moves all extremities. Skin: Warm and dry.  No rashes noted.  Data Reviewed:   CBC: Recent Labs  Lab 06/25/23 0743 06/25/23 2104 06/26/23 0707 06/27/23 0530 06/28/23 0523  WBC 11.8* 12.4* 12.7* 13.2* 11.6*  HGB 5.3* 8.0* 7.8* 8.4* 7.6*  HCT 18.0* 25.4* 24.6* 27.8* 24.2*  MCV 82.6 84.7 84.2 86.9 84.6  PLT 283 282 280 316 319    Basic Metabolic Panel: Recent Labs  Lab 06/25/23 0604 06/27/23 0530 06/28/23 0523  NA 141 134* 134*  K 3.5 3.7 3.6  CL 106 103 101  CO2 24 22 24   GLUCOSE 151* 159* 106*  BUN 34* 26* 25*  CREATININE 0.68 0.58* 0.38*  CALCIUM 8.6* 8.5* 8.5*  MG  --  2.4  --     Liver Function Tests: Recent Labs  Lab 06/25/23 0604  AST 37  ALT 63*  ALKPHOS 48  BILITOT 0.3  PROT 5.9*  ALBUMIN 3.0*     Radiology Studies: DG Abd 2 Views Result Date: 06/29/2023 CLINICAL DATA:  47 year old male with history of ileus. EXAM: ABDOMEN - 2 VIEW COMPARISON:  Abdominal radiograph 06/24/2023. FINDINGS: Oral contrast material is noted in the colon and rectum. Multiple air-fluid levels are noted on the upright projection, however,  these appear to be colonic. No definite pathologic dilatation of small bowel noted. No pneumoperitoneum. Percutaneous gastrostomy tube noted projecting over the left side of the abdomen. IMPRESSION: 1. Nonobstructive bowel gas pattern. 2. No pneumoperitoneum. Electronically Signed   By: Trudie Reed M.D.   On: 06/29/2023 08:56       LOS: 15 days    Joycelyn Das, MD [facility] Available via Epic secure chat 7am-7pm After these hours, please refer to coverage provider listed on [email or web address] 06/29/2023, 3:24 PM

## 2023-06-29 NOTE — Progress Notes (Signed)
Mobility Specialist - Progress Note   06/29/23 1233  Mobility  Activity Transferred from bed to chair  Level of Assistance Modified independent, requires aide device or extra time  Assistive Device None  Activity Response Tolerated well  Mobility Referral Yes  Mobility visit 1 Mobility  Mobility Specialist Start Time (ACUTE ONLY) 1219  Mobility Specialist Stop Time (ACUTE ONLY) 1230  Mobility Specialist Time Calculation (min) (ACUTE ONLY) 11 min   Pt received in bed declining mobility but agreeable to transfer to recliner. No complaints during transfer. Pt to recliner after session.   [facility]

## 2023-06-29 NOTE — Progress Notes (Signed)
Patient refused Osmolite 1.5 CAL liquid. Discussed the importance of feeding supplement. Pt is not agreeable at this time. Providers are aware.

## 2023-06-29 NOTE — Plan of Care (Signed)
  Problem: Education: Goal: Knowledge of General Education information will improve Description: Including pain rating scale, medication(s)/side effects and non-pharmacologic comfort measures Outcome: Progressing   Problem: Health Behavior/Discharge Planning: Goal: Ability to manage health-related needs will improve Outcome: Progressing   Problem: Clinical Measurements: Goal: Ability to maintain clinical measurements within normal limits will improve Outcome: Progressing Goal: Will remain free from infection Outcome: Progressing Goal: Diagnostic test results will improve Outcome: Progressing Goal: Respiratory complications will improve Outcome: Progressing Goal: Cardiovascular complication will be avoided Outcome: Progressing   Problem: Activity: Goal: Risk for activity intolerance will decrease Outcome: Progressing   Problem: Coping: Goal: Level of anxiety will decrease Outcome: Progressing   Problem: Elimination: Goal: Will not experience complications related to bowel motility Outcome: Progressing Goal: Will not experience complications related to urinary retention Outcome: Progressing   Problem: Pain Management: Goal: General experience of comfort will improve Outcome: Progressing   Problem: Safety: Goal: Ability to remain free from injury will improve Outcome: Progressing   Problem: Skin Integrity: Goal: Risk for impaired skin integrity will decrease Outcome: Progressing

## 2023-06-29 NOTE — Plan of Care (Signed)

## 2023-06-30 DIAGNOSIS — R627 Adult failure to thrive: Secondary | ICD-10-CM | POA: Diagnosis not present

## 2023-06-30 LAB — CBC
HCT: 23.6 % — ABNORMAL LOW (ref 39.0–52.0)
Hemoglobin: 7.2 g/dL — ABNORMAL LOW (ref 13.0–17.0)
MCH: 26 pg (ref 26.0–34.0)
MCHC: 30.5 g/dL (ref 30.0–36.0)
MCV: 85.2 fL (ref 80.0–100.0)
Platelets: 338 10*3/uL (ref 150–400)
RBC: 2.77 MIL/uL — ABNORMAL LOW (ref 4.22–5.81)
RDW: 16.6 % — ABNORMAL HIGH (ref 11.5–15.5)
WBC: 11.6 10*3/uL — ABNORMAL HIGH (ref 4.0–10.5)
nRBC: 0.2 % (ref 0.0–0.2)

## 2023-06-30 LAB — BASIC METABOLIC PANEL
Anion gap: 7 (ref 5–15)
BUN: 23 mg/dL — ABNORMAL HIGH (ref 6–20)
CO2: 25 mmol/L (ref 22–32)
Calcium: 8.2 mg/dL — ABNORMAL LOW (ref 8.9–10.3)
Chloride: 101 mmol/L (ref 98–111)
Creatinine, Ser: 0.54 mg/dL — ABNORMAL LOW (ref 0.61–1.24)
GFR, Estimated: >60 mL/min (ref >60)
Glucose, Bld: 116 mg/dL — ABNORMAL HIGH (ref 70–99)
Potassium: 3.5 mmol/L (ref 3.5–5.1)
Sodium: 133 mmol/L — ABNORMAL LOW (ref 135–145)

## 2023-06-30 LAB — MAGNESIUM: Magnesium: 2.3 mg/dL (ref 1.7–2.4)

## 2023-06-30 MED ORDER — SORBITOL 70 % SOLN: 30.0000 mL | Freq: Every day | Status: DC | PRN

## 2023-06-30 MED ORDER — SIMETHICONE 40 MG/0.6ML PO SUSP
40.0000 mg | Freq: Four times a day (QID) | ORAL | Status: DC
  Administered 2023-06-30 – 2023-07-08 (×31): 40 mg
  Filled 2023-06-30 (×38): qty 0.6

## 2023-06-30 MED ORDER — DEXAMETHASONE 1 MG/ML PO CONC
4.0000 mg | Freq: Every day | ORAL | Status: DC
  Administered 2023-07-01 – 2023-07-07 (×7): 4 mg
  Filled 2023-06-30 (×7): qty 4

## 2023-06-30 MED ORDER — FREE WATER
50.0000 mL | Freq: Four times a day (QID) | Status: DC
  Administered 2023-06-30 – 2023-07-01 (×3): 50 mL

## 2023-06-30 MED ORDER — POLYETHYLENE GLYCOL 3350 17 G PO PACK
17.0000 g | PACK | Freq: Every day | ORAL | Status: DC
Start: 2023-06-30 — End: 2023-07-09
  Administered 2023-07-03 – 2023-07-08 (×5): 17 g
  Filled 2023-06-30 (×7): qty 1

## 2023-06-30 MED ORDER — FLEET ENEMA RE ENEM: 1.0000 | ENEMA | Freq: Every day | RECTAL | Status: DC | PRN

## 2023-06-30 MED ORDER — DOCUSATE SODIUM 50 MG/5ML PO LIQD: 100.0000 mg | Freq: Two times a day (BID) | ORAL | Status: DC | PRN

## 2023-06-30 MED ORDER — ENSURE ENLIVE PO LIQD
237.0000 mL | Freq: Three times a day (TID) | ORAL | Status: DC
  Administered 2023-06-30 – 2023-07-07 (×4): 237 mL via ORAL

## 2023-06-30 MED ORDER — SIMETHICONE 80 MG PO CHEW
80.0000 mg | CHEWABLE_TABLET | Freq: Four times a day (QID) | ORAL | Status: DC
Start: 2023-06-30 — End: 2023-06-30

## 2023-06-30 NOTE — Progress Notes (Signed)
Jesus Haynes refuses to reposition in the bed, explained the importance of getting off back. Refuses to sit up in chair. Has been using the urinal.

## 2023-06-30 NOTE — Progress Notes (Signed)
[facility] Progress Note  Jesus Haynes 47 y.o. 1976/04/27   Subjective: Reports semi-formed stool yesterday and feels like he needs to have another BM this morning. Denies abdominal pain. Continues to refuse tube feeds.  Objective: Vital signs: Vitals:   06/29/23 2202 06/30/23 0515  BP: 103/73 115/73  Pulse: 71 68  Resp: 15 16  Temp: 98.7 F (37.1 C) 98.4 F (36.9 C)  SpO2: 95% 96%    Physical Exam: Gen: lethargic, no acute distress HEENT: anicteric sclera CV: RRR Chest: CTA B Abd: mild distention, diffuse tenderness with guarding, +BS, J- tube intact Ext: no edema  Lab Results: Recent Labs    06/28/23 0523  NA 134*  K 3.6  CL 101  CO2 24  GLUCOSE 106*  BUN 25*  CREATININE 0.38*  CALCIUM 8.5*   No results for input(s): "AST", "ALT", "ALKPHOS", "BILITOT", "PROT", "ALBUMIN" in the last 72 hours. Recent Labs    06/28/23 0523  WBC 11.6*  HGB 7.6*  HCT 24.2*  MCV 84.6  PLT 319      Assessment/Plan: Resolving ileus in setting of metastatic GEJ cancer - bowels are starting to move. Black/brown stools. Hgb 7.6. Again encouraged him to start tube feeds and he will continue to ponder it. Supportive care. Dr. Lorenso Quarry will f/u tomorrow.   Shirley Friar 06/30/2023, 8:18 AM  Questions please call [phone] ID: Jesus Haynes, male   DOB: 08-14-1975, 47 y.o.   MRN: [id number]

## 2023-06-30 NOTE — Progress Notes (Signed)
[facility] Progress Note Patient: Jesus Haynes UJW:[id number] DOB: [birth year] DOA: 06/14/2023  DOS: the patient was seen and examined on 06/30/2023  Brief Hospital Course: 47 year old male with PMH of adenocarcinoma of the GE junction with a fungating mass with dysphagia and pain, anemia from chronic blood loss presented to hospital with complaints of worsening pain and near syncope. 11/25 - 11/28 presents with dysphagia and abdominal pain was found to have adenocarcinoma of the GE junction.  Set up with hematology outpatient. 12/12 underwent Port-A-Cath placement. 12/13 due to dizziness while in the clinic was referred for direct admission.  Being treated for failure to thrive. 12/17 after failing conservative management with ongoing pain and difficulty swallowing underwent open placement of feeding jejunostomy tube with Dr. Sophronia Simas. 12/18 started on tube feeds Osmolite. 12/23 appears to have developed colonic ileus.  SBO protocol ordered.  EKG was consulted. 12/24 Hb dropped to 5.3.  Received 2 PRBC transfusion.  Recheck Hb 8.0 close to baseline. 12/25.  Patient requested to be off of tube feeding and has been refusing since as it was causing excessive fullness. 12/27 surgery signed off. Assessment and Plan: Adult failure to thrive. Moderate protein calorie malnutrition  Body mass index is 28.25 kg/m.  Presents with dizziness and lightheadedness.  In the setting of poor p.o. intake from failure to thrive. Dietitian has been following. Due to poor p.o. intake general surgery was consulted for consideration of feeding jejunostomy tube placement. After ensuring that the patient can receive tube feeding outpatient (due to lack of insurance) patient consented to undergo jejunostomy tube placement. Was started on tube feeding and as the patient developed ileus likely from narcotics due to refusal of bowel regimen patient has been refusing tube feeds since. Currently continues to  refuse tube feeds and wants to try orally. Will switch boost to Ensure and monitor. Will also initiate calorie count. TPN was considered but currently not initiated since his GI tract appears to be functioning well now. Continue to educate the patient in regards to importance of nutrition.  Colonic ileus. GI & surgery seeing the patient.  Surgery currently signed off. Last x-ray on 12/28 shows multiple air-fluid level with no significant obstruction. Continue bowel regimen. Add simethicone. Minimize narcotics. Repeat x-ray tomorrow.  Cancer-related pain. Palliative care helping out with pain management. Patient is on methadone, dexamethasone, Dilaudid as needed and Robaxin injection twice daily. Will monitor progression.  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 Oncology following. Also seen in the hospital. Eventual plan is to initiate outpatient chemotherapy as well as radiation. Currently patient needs to work on his nutritional status as well as functional status which appears to be very poor right now.  Near syncope. Presented with dizziness and lightheadedness as well as near syncope in the clinic while having blood draws. Currently ongoing dizziness reported by the patient. Blood pressure soft.  Will check orthostatic vitals. Telemetry evaluation unremarkable. Carotid Doppler normal. Echocardiogram EF 60-65%, no significant valvular abnormality. Initial presentation could be secondary to uncontrolled pain versus vasovagal event. His ongoing dizziness is likely secondary to pain medications. Check orthostatic.  Anemia secondary to nutritional deficiency as well as chronic GI blood loss. Hemoglobin and November 13. Hemoglobin dropped down to 5.3 on 12/24 requiring blood transfusion. Currently hemoglobin continues to have a downward trend of 7.2 hemoglobin. Iron level 20, 12/13.  No vitamin levels checked. Will check labs and replace as needed. Given his poor oral  intake would benefit from IV iron. Transfuse for hemoglobin less  than 7. Avoiding pharmacological DVT prophylaxis.  Goals of care conversation. I had a candid conversation with patient with regards to his goals of care on 12/29. Patient clearly has been refusing his medications in the hospital as prescribed. Patient also clearly refusing to initiate tube feedings. While understandably patient's concern that if he is feeling full and he would like to stop his tube feeding at that time is appropriate, patient was educated with regards to importance of nutrition and healing as well as his ability to tolerate chemotherapy or even being considered a candidate for 1. Also informed the patient that he is colonic ileus is secondary to narcotics side effect and pain medication and bowel regimen will go hand-in-hand even if he has a bowel movement. Patient verbalized understanding. Will initiate calorie count. At patient's request we will switch boost to ensure. Monitor progression.   Subjective: No nausea no vomiting.  Had a BM.  No blood in the stool.  No abdominal pain.  Reports dizziness is improving.  He reports that he may have had some chicken broth yesterday and may have had some "other" food items yesterday.  Physical Exam: General: in Mild distress, No Rash Cardiovascular: S1 and S2 Present, No Murmur Respiratory: Good respiratory effort, Bilateral Air entry present. No Crackles, No wheezes Abdomen: Bowel Sound present, distended, mild tenderness Extremities: Trace edema Neuro: Alert and oriented x3, no new focal deficit  Data Reviewed: I have Reviewed nursing notes, Vitals, and Lab results. Since last encounter, pertinent lab results CBC and BMP   . I have ordered test including CBC BMP magnesium phosphorus  . I have ordered imaging x-ray abdomen  .   Disposition: Status is: Inpatient Remains inpatient appropriate because: Need to tolerate oral diet  SCDs Start: 06/14/23 1621    Family Communication: No one at bedside Level of care: Telemetry   Vitals:   06/29/23 2202 06/30/23 0500 06/30/23 0515 06/30/23 1330  BP: 103/73  115/73 107/64  Pulse: 71  68 66  Resp: 15  16 20   Temp: 98.7 F (37.1 C)  98.4 F (36.9 C) 98.1 F (36.7 C)  TempSrc: Oral  Oral Oral  SpO2: 95%  96% 92%  Weight:  79.4 kg    Height:         Author: Lynden Oxford, MD 06/30/2023 4:59 PM  Please look on [email or web address] to find out who is on call.

## 2023-07-01 ENCOUNTER — Inpatient Hospital Stay (HOSPITAL_COMMUNITY): Payer: Medicaid Other

## 2023-07-01 DIAGNOSIS — R627 Adult failure to thrive: Secondary | ICD-10-CM | POA: Diagnosis not present

## 2023-07-01 LAB — COMPREHENSIVE METABOLIC PANEL
ALT: 91 U/L — ABNORMAL HIGH (ref 0–44)
AST: 44 U/L — ABNORMAL HIGH (ref 15–41)
Albumin: 3 g/dL — ABNORMAL LOW (ref 3.5–5.0)
Alkaline Phosphatase: 55 U/L (ref 38–126)
Anion gap: 9 (ref 5–15)
BUN: 22 mg/dL — ABNORMAL HIGH (ref 6–20)
CO2: 24 mmol/L (ref 22–32)
Calcium: 8.4 mg/dL — ABNORMAL LOW (ref 8.9–10.3)
Chloride: 100 mmol/L (ref 98–111)
Creatinine, Ser: 0.55 mg/dL — ABNORMAL LOW (ref 0.61–1.24)
GFR, Estimated: >60 mL/min (ref >60)
Glucose, Bld: 119 mg/dL — ABNORMAL HIGH (ref 70–99)
Potassium: 3.5 mmol/L (ref 3.5–5.1)
Sodium: 133 mmol/L — ABNORMAL LOW (ref 135–145)
Total Bilirubin: 0.6 mg/dL (ref <1.2)
Total Protein: 5.9 g/dL — ABNORMAL LOW (ref 6.5–8.1)

## 2023-07-01 LAB — PROTIME-INR
INR: 1.6 — ABNORMAL HIGH (ref 0.8–1.2)
Prothrombin Time: 19.5 s — ABNORMAL HIGH (ref 11.4–15.2)

## 2023-07-01 LAB — IRON AND TIBC
Iron: 23 ug/dL — ABNORMAL LOW (ref 45–182)
Saturation Ratios: 7 % — ABNORMAL LOW (ref 17.9–39.5)
TIBC: 356 ug/dL (ref 250–450)
UIBC: 333 ug/dL

## 2023-07-01 LAB — RENAL FUNCTION PANEL
Albumin: 2.8 g/dL — ABNORMAL LOW (ref 3.5–5.0)
Anion gap: 6 (ref 5–15)
BUN: 21 mg/dL — ABNORMAL HIGH (ref 6–20)
CO2: 26 mmol/L (ref 22–32)
Calcium: 8.3 mg/dL — ABNORMAL LOW (ref 8.9–10.3)
Chloride: 99 mmol/L (ref 98–111)
Creatinine, Ser: 0.6 mg/dL — ABNORMAL LOW (ref 0.61–1.24)
GFR, Estimated: >60 mL/min (ref >60)
Glucose, Bld: 116 mg/dL — ABNORMAL HIGH (ref 70–99)
Phosphorus: 3.5 mg/dL (ref 2.5–4.6)
Potassium: 3.5 mmol/L (ref 3.5–5.1)
Sodium: 131 mmol/L — ABNORMAL LOW (ref 135–145)

## 2023-07-01 LAB — CBC
HCT: 24.5 % — ABNORMAL LOW (ref 39.0–52.0)
Hemoglobin: 7.5 g/dL — ABNORMAL LOW (ref 13.0–17.0)
MCH: 26 pg (ref 26.0–34.0)
MCHC: 30.6 g/dL (ref 30.0–36.0)
MCV: 85.1 fL (ref 80.0–100.0)
Platelets: 391 10*3/uL (ref 150–400)
RBC: 2.88 MIL/uL — ABNORMAL LOW (ref 4.22–5.81)
RDW: 16.5 % — ABNORMAL HIGH (ref 11.5–15.5)
WBC: 12.2 10*3/uL — ABNORMAL HIGH (ref 4.0–10.5)
nRBC: 0.2 % (ref 0.0–0.2)

## 2023-07-01 LAB — VITAMIN B12: Vitamin B-12: 263 pg/mL (ref 180–914)

## 2023-07-01 LAB — MAGNESIUM: Magnesium: 2.3 mg/dL (ref 1.7–2.4)

## 2023-07-01 LAB — GLUCOSE, CAPILLARY
Glucose-Capillary: 141 mg/dL — ABNORMAL HIGH (ref 70–99)
Glucose-Capillary: 134 mg/dL — ABNORMAL HIGH (ref 70–99)

## 2023-07-01 LAB — FOLATE: Folate: 4.1 ng/mL — ABNORMAL LOW (ref >5.9)

## 2023-07-01 LAB — PHOSPHORUS: Phosphorus: 3.3 mg/dL (ref 2.5–4.6)

## 2023-07-01 LAB — GUARDANT 360

## 2023-07-01 MED ORDER — CYANOCOBALAMIN 1000 MCG/ML IJ SOLN
1000.0000 ug | Freq: Once | INTRAMUSCULAR | Status: AC
  Administered 2023-07-01: 1000 ug via SUBCUTANEOUS
  Filled 2023-07-01: qty 1

## 2023-07-01 MED ORDER — SODIUM CHLORIDE 0.9 % IV SOLN
100.0000 mg | Freq: Once | INTRAVENOUS | Status: AC
  Administered 2023-07-01: 100 mg via INTRAVENOUS
  Filled 2023-07-01: qty 5

## 2023-07-01 MED ORDER — METOCLOPRAMIDE HCL 10 MG/10ML PO SOLN
5.0000 mg | Freq: Three times a day (TID) | ORAL | Status: DC
  Administered 2023-07-01 – 2023-07-03 (×5): 5 mg
  Filled 2023-07-01 (×8): qty 10

## 2023-07-01 MED ORDER — SODIUM CHLORIDE 0.9 % IV SOLN: 1.0000 mg | Freq: Once | INTRAVENOUS | Status: DC

## 2023-07-01 MED ORDER — POTASSIUM CHLORIDE 20 MEQ PO PACK
60.0000 meq | PACK | Freq: Once | ORAL | Status: AC
  Administered 2023-07-01: 60 meq
  Filled 2023-07-01: qty 3

## 2023-07-01 MED ORDER — FOLIC ACID 5 MG/ML IJ SOLN
1.0000 mg | Freq: Once | INTRAMUSCULAR | Status: AC
  Administered 2023-07-01: 1 mg via INTRAVENOUS
  Filled 2023-07-01: qty 0.2

## 2023-07-01 MED ORDER — CHLORHEXIDINE GLUCONATE CLOTH 2 % EX PADS
6.0000 | MEDICATED_PAD | Freq: Every day | CUTANEOUS | Status: DC
  Administered 2023-07-02 – 2023-07-11 (×5): 6 via TOPICAL

## 2023-07-01 MED ORDER — ADULT MULTIVITAMIN LIQUID CH
15.0000 mL | Freq: Every day | ORAL | Status: DC
Start: 2023-07-01 — End: 2023-07-09
  Administered 2023-07-01 – 2023-07-08 (×8): 15 mL
  Filled 2023-07-01 (×9): qty 15

## 2023-07-01 MED ORDER — FOLIC ACID 1 MG PO TABS
1.0000 mg | ORAL_TABLET | Freq: Every day | ORAL | Status: DC
Start: 2023-07-02 — End: 2023-07-01

## 2023-07-01 MED ORDER — FOLIC ACID 5 MG/ML IJ SOLN
1.0000 mg | Freq: Every day | INTRAMUSCULAR | Status: DC
  Filled 2023-07-01: qty 0.2

## 2023-07-01 MED ORDER — FREE WATER
200.0000 mL | Freq: Four times a day (QID) | Status: DC
  Administered 2023-07-01 – 2023-07-02 (×5): 200 mL

## 2023-07-01 MED ORDER — OSMOLITE 1.5 CAL PO LIQD: 237.0000 mL | Freq: Three times a day (TID) | ORAL | Status: DC

## 2023-07-01 MED ORDER — FOLIC ACID 1 MG PO TABS
1.0000 mg | ORAL_TABLET | Freq: Every day | ORAL | Status: DC
  Administered 2023-07-02 – 2023-07-08 (×7): 1 mg
  Filled 2023-07-01 (×7): qty 1

## 2023-07-01 NOTE — Progress Notes (Signed)
Pt refused orthostatic vitals. Dr. Allena Katz made aware.

## 2023-07-01 NOTE — Plan of Care (Signed)
  Problem: Education: Goal: Knowledge of General Education information will improve Description Including pain rating scale, medication(s)/side effects and non-pharmacologic comfort measures Outcome: Progressing   

## 2023-07-01 NOTE — Progress Notes (Signed)
Nutrition Follow-up  DOCUMENTATION CODES:   Non-severe (moderate) malnutrition in context of chronic illness  INTERVENTION:  - Once patient agreeable/accepting to start tube feeds, recommend Osmolite 1.5 at 30mL/hr.  - Likely start tomorrow, 12/31.  - Monitor magnesium, potassium, and phosphorus BID for at least 3 days, MD to replete as needed, as pt is at risk for refeeding syndrome.   - Once tolerating and patient agreeable to increase, consider advancing by 10mL Q24H to goal rate of 78mL/hr.              - Goal of 28mL/hr provides 2520 kcals, 105g protein, and free water   - Full Liquid diet per MD.  - Ensure Plus High Protein po TID, each supplement provides 350 kcal and 20 grams of protein.   - Monitor weight trends.  NUTRITION DIAGNOSIS:   Moderate Malnutrition related to chronic illness, cancer and cancer related treatments as evidenced by energy intake < or equal to 75% for > or equal to 1 month, mild fat depletion, percent weight loss. *ongoing  GOAL:   Patient will meet greater than or equal to 90% of their needs *not met  MONITOR:   Labs, Weight trends, I & O's, PO intake, Supplement acceptance, TF tolerance  REASON FOR ASSESSMENT:   Consult Calorie Count  ASSESSMENT:   47 y.o. male with medical history significant of asthma, adenocarcinoma of the gastroesophageal junction, cancer associated pain, history of GI bleed, anemia due to chronic blood loss was sent from the cancer center after he had a near syncopal episode while they were drawing multiple vials of blood.  12/13: admitted 12/17: s/p open J-tube placement 12/20: abdominal xray -> ileus 12/27: patient initially agreeable to restart TF at 78mL/hr but later refused 12/28: abdominal xray -> nonobstructive bowel gas pattern  Patient refused to restart tube feeds the entire weekend (12/27-12/30).  Upon meeting with patient today he does not give a specific reason for refusing them.  States he  is taking in some liquids but admits it is a small amount. Only ordering about 1 meal a day. Appetite remains decreased.  Was switched to Ensure by MD and states he doesn't really have a preference between Ensure or Boost. Noted to have refused 2 Ensures today so far.   Stressed importance of trying to restart tube feeds as he has had poor nutrition since admission. He is hesitantly agreeable to maybe start tomorrow.   Discussed patient with MD. Initially consulted for calorie count however as patient agreeing to try tube feeds tomorrow MD discontinuing calorie count fo now.  MD also stressing to patient the importance of restarting tube feeds as he has to improve nutritional status for treatment options. Will need to continue to encourage PO intake and acceptance of tube feeds.    GI note today indicates ileus resolving. GI signed off. As patient has a functional GI tract, TPN not indicated.  Continue to recommend restarting tube feeds at 53mL/hr once patient agreeable to do so.    Admit weight: 177# Current weight: 171# I&O's: +2.4L *Suspect further weight loss since admission due to ongoing inadequate oral intake and refusal of tube feeds  Medications reviewed and include: vitamin B12 (once), 1mg  folic acid, Q4H FWF, Reglan, MVI, Miralax  Labs reviewed:  Na 131   Diet Order:   Diet Order             Diet full liquid Fluid consistency: Thin  Diet effective now  EDUCATION NEEDS:  Education needs have been addressed  Skin:  Skin Assessment: Skin Integrity Issues: Skin Integrity Issues:: Incisions Incisions: 12/17 abdomen  Last BM:  12/26 - type 7  Height:  Ht Readings from Last 1 Encounters:  06/26/23 5\' 6"  (1.676 m)   Weight:  Wt Readings from Last 1 Encounters:  07/01/23 77.7 kg    BMI:  Body mass index is 27.65 kg/m.  Estimated Nutritional Needs:   Kcal:  2200-2400 Protein:  105-115g Fluid:  2.2L/day    Shelle Iron RD, LDN Contact via Secure Chat.

## 2023-07-01 NOTE — Progress Notes (Addendum)
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 11/25 - 11/28 presents with dysphagia and abdominal pain was found to have adenocarcinoma of the GE junction.  Set up with hematology outpatient. 12/12 underwent Port-A-Cath placement. 12/13 due to dizziness while in the clinic was referred for direct admission.  Being treated for failure to thrive. 12/17 after failing conservative management with ongoing pain and difficulty swallowing underwent open placement of feeding jejunostomy tube with Dr. Sophronia Simas. 12/18 started on tube feeds Osmolite. 12/23 appears to have developed colonic ileus.  SBO protocol ordered.  EKG was consulted. 12/24 Hb dropped to 5.3.  Received 2 PRBC transfusion.  Recheck Hb 8.0 close to baseline. 12/25.  Patient requested to be off of tube feeding and has been refusing since as it was causing "excessive fullness". 12/27 surgery signed off. 12/30.  Colonic ileus improving.  GI signed off. Assessment and Plan: Adult failure to thrive. Moderate protein calorie malnutrition  Body mass index is 28.25 kg/m.  Presents with dizziness and lightheadedness.  In the setting of poor p.o. intake from failure to thrive. Dietitian has been following. Due to poor p.o. intake general surgery was consulted for consideration of feeding jejunostomy tube placement. After ensuring that the patient can receive tube feeding outpatient (due to lack of insurance) patient consented to undergo jejunostomy tube placement. Was started on tube feeding and as the patient developed ileus likely from narcotics due to refusal of bowel regimen patient has been  refusing tube feeds since. Agreeable to initiate free water at a higher rate.  Unfortunately will not be a candidate for bolus feeding given his jejunostomy tube. Will discuss with regards to cyclical feeding. TPN was considered but currently not initiated since his GI tract appears to be functioning well Continue to educate the patient in regards to importance of nutrition. Initiate prokinetic medications.  Colonic ileus. GI & surgery seeing the patient.  Surgery currently signed off. Last x-ray on 12/28 shows multiple air-fluid level with no significant obstruction. Continue bowel regimen. Add simethicone. Minimize narcotics. Repeat x-ray shows evidence of improvement.  Cancer-related pain. Palliative care helping out with pain management. Patient is on methadone, dexamethasone, Dilaudid as needed and Robaxin injection twice daily. Will monitor progression.  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 Oncology following. Also seen in the hospital. Eventual plan is to initiate outpatient chemotherapy as well as radiation. Currently patient needs to work on his nutritional status as well as functional status which appears to be very poor right now.  Near syncope. Presented with dizziness and lightheadedness as well as near syncope in the clinic while having blood draws. Currently ongoing dizziness reported by the patient. Blood pressure soft.  Will check orthostatic vitals. Telemetry evaluation unremarkable. Carotid Doppler normal. Echocardiogram EF 60-65%, no significant valvular abnormality. Initial presentation could be secondary to uncontrolled pain versus vasovagal event. His ongoing dizziness is likely secondary to pain medications. orthostatic vitals ordered.  Patient refuses.  If patient remains with complaints of dizziness, I would recommend to consider reduction in pain medications.  Anemia secondary to nutritional deficiency as well as chronic GI blood  loss. Hemoglobin and November 13. Hemoglobin dropped down to 5.3 on 12/24 requiring blood  transfusion. Currently hemoglobin continues to have a downward trend of 7.2 hemoglobin. Iron level remains low, 23.  B12 263 relatively low, folic acid 4.1 low. INR elevated 1.6. Initiate multivitamins.  Initiate folic acid supplementation.  Give IV iron infusion given patient's on persistent refusal for oral nutrition.  Initiated B12 injections. Transfuse for hemoglobin less than 7. Avoiding pharmacological DVT prophylaxis.  Patient is aware that this places the patient at a high risk for poor outcome including the DVT.  Goals of care conversation. I had a candid conversation with patient with regards to his goals of care on 12/29. Patient clearly has been refusing his medications in the hospital as prescribed. Patient also clearly refusing to initiate tube feedings. While understandably patient's concern that if he is feeling full and he would like to stop his tube feeding at that time is appropriate, patient was educated with regards to importance of nutrition and healing as well as his ability to tolerate chemotherapy or even being considered a candidate for 1. Also informed the patient that he is colonic ileus is secondary to narcotics side effect and pain medication and bowel regimen will go hand-in-hand even if he has a bowel movement. Patient verbalized understanding. Currently somewhat more agreeable compared to 12/29 for initiation of tube feed.  Will increase free water and monitor.   Relative hypokalemia. Will try to maintain potassium more than 4 given ileus. Also add Reglan and Gas-X for ileus.  Subjective: No nausea no vomiting.  No fever no chills.  Passing gas but no BM today.  Refusing care.  Physical Exam: General: in Mild distress, No Rash Cardiovascular: S1 and S2 Present, No Murmur Respiratory: Good respiratory effort, Bilateral Air entry present. No Crackles, No  wheezes Abdomen: Bowel Sound present,, distended, no tenderness Extremities: No edema Neuro: Alert and oriented x3, no new focal deficit  Data Reviewed: I have Reviewed nursing notes, Vitals, and Lab results. Since last encounter, pertinent lab results CBC and BMP   . I have ordered test including CBC and CMP  . I have discussed pt's care plan and test results with dietitian  .   Disposition: Status is: Inpatient Remains inpatient appropriate because: Monitor for improvement in oral intake  Place and maintain sequential compression device Start: 07/01/23 0926 Place TED hose Start: 07/01/23 0921 SCDs Start: 06/14/23 1621   Family Communication: No one at bedside Level of care: Telemetry   Vitals:   06/30/23 2109 07/01/23 0404 07/01/23 0528 07/01/23 1339  BP: 112/69  115/67 108/71  Pulse: 65  62 65  Resp:   16 15  Temp: 98.3 F (36.8 C)  98.2 F (36.8 C) 97.9 F (36.6 C)  TempSrc: Oral  Oral Oral  SpO2: 99%  99% 95%  Weight:  77.7 kg    Height:         Author: Lynden Oxford, MD 07/01/2023 7:00 PM  Please look on [email or web address] to find out who is on call.

## 2023-07-01 NOTE — Progress Notes (Signed)
PT Cancellation Note  Patient Details Name: Jesus Haynes MRN: [id number] DOB: 1975-10-25   Cancelled Treatment:    Reason Eval/Treat Not Completed: Other (comment). Pt declined therapy this am reporting just having medications and not wanting to move around. PT to return as schedule allows and continue to follow acutely.   Johnny Bridge, PT Acute Rehab   Jacqualyn Posey 07/01/2023, 11:14 AM

## 2023-07-01 NOTE — Progress Notes (Signed)
PT Cancellation Note  Patient Details Name: Jesus Haynes MRN: [id number] DOB: 11-Dec-1975   Cancelled Treatment:    Reason Eval/Treat Not Completed: Other (comment). PT returned in PM, pts daughter present and pt declined therapy. PT to return if schedule allows. PT to continue to follow acutely.   Johnny Bridge, PT Acute Rehab   Jacqualyn Posey 07/01/2023, 3:53 PM

## 2023-07-01 NOTE — Progress Notes (Signed)
[facility] Progress Note  SUBJECTIVE:   Interval history: Jesus Haynes was seen and evaluated today at bedside. Resting in bed, noted that he had a small bowel movement 2 midnights ago. He denied nausea, vomiting. He is drinking some free liquids. He denied specific abdominal pain. No chest pain or shortness of breath. He is agreeable to resuming jejunal tube feeds.   Past Medical History:  Diagnosis Date   Asthma    Past Surgical History:  Procedure Laterality Date   BIOPSY  05/28/2023   Procedure: BIOPSY;  Surgeon: Vida Rigger, MD;  Location: [facility];  Service: Gastroenterology;;   ESOPHAGOGASTRODUODENOSCOPY (EGD) WITH PROPOFOL N/A 05/28/2023   Procedure: ESOPHAGOGASTRODUODENOSCOPY (EGD) WITH PROPOFOL;  Surgeon: Vida Rigger, MD;  Location: [facility];  Service: Gastroenterology;  Laterality: N/A;   IR IMAGING GUIDED PORT INSERTION  06/13/2023   UMBILICAL HERNIA REPAIR N/A 06/18/2023   Procedure: PLACEMENT OF J TUBE;  Surgeon: Fritzi Mandes, MD;  Location: [facility];  Service: General;  Laterality: N/A;   Current Facility-Administered Medications  Medication Dose Route Frequency Provider Last Rate Last Admin   acetaminophen (TYLENOL) suppository 650 mg  650 mg Rectal Q6H Sharl Ma, Sarina Ill, MD   650 mg at 06/27/23 1330   Or   acetaminophen (TYLENOL) 160 MG/5ML solution 650 mg  650 mg Per Tube Q6H Meredeth Ide, MD   650 mg at 07/01/23 0609   albuterol (PROVENTIL) (2.5 MG/3ML) 0.083% nebulizer solution 2.5 mg  2.5 mg Nebulization Q4H PRN Adam Phenix, PA-C   2.5 mg at 06/25/23 1313   Chlorhexidine Gluconate Cloth 2 % PADS 6 each  6 each Topical Daily Rolly Salter, MD       dexamethasone (DECADRON) 1 MG/ML solution 4 mg  4 mg Per Tube Daily Rolly Salter, MD   4 mg at 07/01/23 1030   diazepam (VALIUM) 1 MG/ML solution 5 mg  5 mg Per Tube Q6H PRN Ulice Bold, NP       docusate (COLACE) 50 MG/5ML liquid 100 mg  100 mg Per Tube BID PRN Rolly Salter, MD        feeding supplement (ENSURE ENLIVE / ENSURE PLUS) liquid 237 mL  237 mL Oral TID BM Rolly Salter, MD   237 mL at 06/30/23 2119   [START ON 07/02/2023] folic acid (FOLVITE) tablet 1 mg  1 mg Per Tube Daily Rolly Salter, MD       free water 200 mL  200 mL Per Tube QID Rolly Salter, MD       HYDROmorphone HCl (DILAUDID) liquid 2.5 mg  2.5 mg Oral Q3H PRN Alena Bills, DO   2.5 mg at 06/30/23 2118   Or   HYDROmorphone (DILAUDID) injection 1 mg  1 mg Intravenous Q2H PRN Alena Bills, DO   1 mg at 07/01/23 0424   iohexol (OMNIPAQUE) 300 MG/ML solution 30 mL  30 mL Oral Once PRN Brahmbhatt, Parag, MD       methadone (DOLOPHINE) 10 MG/ML solution 5 mg  5 mg Per Tube Q12H Ulice Bold, NP   5 mg at 07/01/23 1030   methocarbamol (ROBAXIN) injection 1,000 mg  1,000 mg Intravenous BID Alvester Morin W, DO   1,000 mg at 07/01/23 1124   metoCLOPramide (REGLAN) 10 MG/10ML solution 5 mg  5 mg Per Tube TID AC Rolly Salter, MD       multivitamin liquid 15 mL  15 mL Per Tube  Daily Rolly Salter, MD   15 mL at 07/01/23 1031   ondansetron (ZOFRAN) tablet 4 mg  4 mg Per Tube Q6H PRN Meredeth Ide, MD       Or   ondansetron [facility]) injection 4 mg  4 mg Intravenous Q6H PRN Meredeth Ide, MD   4 mg at 07/01/23 0423   polyethylene glycol (MIRALAX / GLYCOLAX) packet 17 g  17 g Per Tube Daily Rolly Salter, MD       potassium chloride (KLOR-CON) packet 60 mEq  60 mEq Per Tube Once Rolly Salter, MD       prochlorperazine (COMPAZINE) injection 5 mg  5 mg Intravenous Q6H PRN Ulice Bold, NP   5 mg at 06/26/23 2131   simethicone (MYLICON) 40 MG/0.6ML suspension 40 mg  40 mg Per Tube QID Rolly Salter, MD   40 mg at 07/01/23 1030   sodium phosphate (FLEET) enema 1 enema  1 enema Rectal Daily PRN Rolly Salter, MD       sorbitol 70 % solution 30 mL  30 mL Per Tube Daily PRN Rolly Salter, MD       Allergies as of 06/14/2023   (No Known Allergies)   Review of Systems:  Review of Systems   Respiratory:  Negative for shortness of breath.   Cardiovascular:  Negative for chest pain.  Gastrointestinal:  Negative for abdominal pain, blood in stool, nausea and vomiting.    OBJECTIVE:   Temp:  [98.1 F (36.7 C)-98.3 F (36.8 C)] 98.2 F (36.8 C) (12/30 0528) Pulse Rate:  [62-66] 62 (12/30 0528) Resp:  [16-20] 16 (12/30 0528) BP: (107-115)/(64-69) 115/67 (12/30 0528) SpO2:  [92 %-99 %] 99 % (12/30 0528) Weight:  [77.7 kg] 77.7 kg (12/30 0404) Last BM Date : 06/30/23 Physical Exam Constitutional:      General: He is not in acute distress.    Appearance: He is not ill-appearing, toxic-appearing or diaphoretic.  Cardiovascular:     Rate and Rhythm: Normal rate and regular rhythm.  Pulmonary:     Effort: No respiratory distress.     Breath sounds: Normal breath sounds.  Abdominal:     General: Bowel sounds are normal. There is distension (mild).     Palpations: Abdomen is soft.     Tenderness: There is no abdominal tenderness. There is no guarding.  Skin:    General: Skin is warm and dry.  Neurological:     Mental Status: He is alert.     Labs: Recent Labs    06/30/23 1210 07/01/23 0513  WBC 11.6* 12.2*  HGB 7.2* 7.5*  HCT 23.6* 24.5*  PLT 338 391   BMET Recent Labs    06/30/23 1210 07/01/23 0513 07/01/23 0514  NA 133* 133* 131*  K 3.5 3.5 3.5  CL 101 100 99  CO2 25 24 26   GLUCOSE 116* 119* 116*  BUN 23* 22* 21*  CREATININE 0.54* 0.55* 0.60*  CALCIUM 8.2* 8.4* 8.3*   LFT Recent Labs    07/01/23 0513 07/01/23 0514  PROT 5.9*  --   ALBUMIN 3.0* 2.8*  AST 44*  --   ALT 91*  --   ALKPHOS 55  --   BILITOT 0.6  --    PT/INR Recent Labs    07/01/23 0513  LABPROT 19.5*  INR 1.6*   Diagnostic imaging: No results found.  IMPRESSION: GE junction invasive adenocarcinoma Protein calorie malnutrition given above Ileus, suspected from narcotic administration,  symptomatically resolved  Status post jejunal feeding tube placement 06/18/23, tube  feeds currently on hold  PLAN: -Continue current bowel regimen -Ok to resume tube feeds -Your Oncologic management  -[facility] will sign off and be available as needed should questions arise    LOS: 17 days   [name], [facility]

## 2023-07-02 ENCOUNTER — Encounter: Payer: Self-pay | Admitting: Oncology

## 2023-07-02 DIAGNOSIS — R627 Adult failure to thrive: Secondary | ICD-10-CM | POA: Diagnosis not present

## 2023-07-02 LAB — CBC
HCT: 21.7 % — ABNORMAL LOW (ref 39.0–52.0)
Hemoglobin: 6.8 g/dL — CL (ref 13.0–17.0)
MCH: 26.3 pg (ref 26.0–34.0)
MCHC: 31.3 g/dL (ref 30.0–36.0)
MCV: 83.8 fL (ref 80.0–100.0)
Platelets: 382 10*3/uL (ref 150–400)
RBC: 2.59 MIL/uL — ABNORMAL LOW (ref 4.22–5.81)
RDW: 16.7 % — ABNORMAL HIGH (ref 11.5–15.5)
WBC: 12.5 10*3/uL — ABNORMAL HIGH (ref 4.0–10.5)
nRBC: 0.5 % — ABNORMAL HIGH (ref 0.0–0.2)

## 2023-07-02 LAB — MAGNESIUM
Magnesium: 2.2 mg/dL (ref 1.7–2.4)
Magnesium: 2.2 mg/dL (ref 1.7–2.4)

## 2023-07-02 LAB — RENAL FUNCTION PANEL
Albumin: 2.5 g/dL — ABNORMAL LOW (ref 3.5–5.0)
Anion gap: 7 (ref 5–15)
BUN: 16 mg/dL (ref 6–20)
CO2: 23 mmol/L (ref 22–32)
Calcium: 8 mg/dL — ABNORMAL LOW (ref 8.9–10.3)
Chloride: 98 mmol/L (ref 98–111)
Creatinine, Ser: 0.48 mg/dL — ABNORMAL LOW (ref 0.61–1.24)
GFR, Estimated: >60 mL/min (ref >60)
Glucose, Bld: 98 mg/dL (ref 70–99)
Phosphorus: 3.8 mg/dL (ref 2.5–4.6)
Potassium: 3.4 mmol/L — ABNORMAL LOW (ref 3.5–5.1)
Sodium: 128 mmol/L — ABNORMAL LOW (ref 135–145)

## 2023-07-02 LAB — GLUCOSE, CAPILLARY: Glucose-Capillary: 154 mg/dL — ABNORMAL HIGH (ref 70–99)

## 2023-07-02 LAB — PHOSPHORUS
Phosphorus: 3.8 mg/dL (ref 2.5–4.6)
Phosphorus: 3.6 mg/dL (ref 2.5–4.6)

## 2023-07-02 LAB — PREPARE RBC (CROSSMATCH)

## 2023-07-02 MED ORDER — FREE WATER
100.0000 mL | Freq: Four times a day (QID) | Status: DC
Start: 2023-07-02 — End: 2023-07-11
  Administered 2023-07-02 – 2023-07-11 (×35): 100 mL

## 2023-07-02 MED ORDER — POTASSIUM CHLORIDE 20 MEQ PO PACK
60.0000 meq | PACK | Freq: Once | ORAL | Status: AC
  Administered 2023-07-02: 60 meq
  Filled 2023-07-02: qty 3

## 2023-07-02 MED ORDER — SODIUM CHLORIDE 0.9% IV SOLUTION
Freq: Once | INTRAVENOUS | Status: AC
  Administered 2023-07-02: 250 mL via INTRAVENOUS

## 2023-07-02 MED ORDER — OSMOLITE 1.5 CAL PO LIQD
1000.0000 mL | ORAL | Status: DC
  Administered 2023-07-02: 1000 mL
  Filled 2023-07-02 (×2): qty 1000

## 2023-07-02 NOTE — Progress Notes (Signed)
 PT Cancellation Note  Patient Details Name: Jesus Haynes MRN: [id number] DOB: [birth year]   Cancelled Treatment:    Reason Eval/Treat Not Completed: Fatigue/lethargy limiting ability to participate (pt reports he slept poorly last night and needs to rest right now.  He requested PT attempt later today. Will follow.)   Sylvan Delon Copp PT 07/02/2023  Acute Rehabilitation Services  Office [phone]

## 2023-07-02 NOTE — Progress Notes (Signed)
 Physical Therapy Treatment Patient Details Name: Jesus Haynes MRN: [id number] DOB: [birth year] Today's Date: 07/02/2023   History of Present Illness Jesus Haynes is a 47 y.o. male presents with near syncopal episode while cancer center was drawing multiple vials of blood on 06/14/23. Pt s/p open placement of feeding jejunostomy tube 06/18/23.  PMH: asthma, adenocarcinoma gastroesophageal junction, cancer associated pain, history of GI bleed, melena, anemia due to chronic blood loss    PT Comments  Noted Hgb 6.8. Pt performed supine to sit slowly with multiple rest breaks due to dizziness. Once in sitting he reported more dizziness. BP supine 120/63, BP sitting 97/53. Pt sat at edge of bed for ~6 minutes during which dizziness did not resolve. Pt performed seated BUE/LE exercises for strengthening. He felt too dizzy to attempt standing. MD/RN notified of orthostatic hypotension.     If plan is discharge home, recommend the following: A little help with walking and/or transfers;A little help with bathing/dressing/bathroom;Assistance with cooking/housework;Assist for transportation;Help with stairs or ramp for entrance   Can travel by private vehicle        Equipment Recommendations  Rolling walker (2 wheels)    Recommendations for Other Services       Precautions / Restrictions Precautions Precautions: Fall;Other (comment) Precaution Comments: orthostatic Restrictions Weight Bearing Restrictions Per Provider Order: No     Mobility  Bed Mobility Overal bed mobility: Modified Independent Bed Mobility: Supine to Sit, Sit to Supine     Supine to sit: Modified independent (Device/Increase time), HOB elevated, Used rails Sit to supine: Modified independent (Device/Increase time), HOB elevated, Used rails   General bed mobility comments: increased time for supine to sit, pt paused incrementally 2* dizziness with coming closer to vertical. BP supine 120/63, sitting 97/53. Pt sat EOB  ~6 minutes, dizziness did not resolve.    Transfers                   General transfer comment: NT-pt dizzy in sitting, didn't feel he could tolerate standing    Ambulation/Gait                   Stairs             Wheelchair Mobility     Tilt Bed    Modified Rankin (Stroke Patients Only)       Balance Overall balance assessment: Needs assistance Sitting-balance support: No upper extremity supported Sitting balance-Leahy Scale: Good     Standing balance support: Bilateral upper extremity supported Standing balance-Leahy Scale: Poor Standing balance comment: steady with RW; stood for at least 2 mins for ADLs prior to ambulation                            Cognition Arousal: Alert Behavior During Therapy: Flat affect Overall Cognitive Status: Within Functional Limits for tasks assessed                                          Exercises General Exercises - Upper Extremity Shoulder Flexion: AROM, 10 reps, Both, Seated General Exercises - Lower Extremity Long Arc Quad: AROM, Both, 10 reps, Seated    General Comments        Pertinent Vitals/Pain Pain Assessment Faces Pain Scale: Hurts a little bit Pain Location: abdomen Pain Descriptors / Indicators: Constant Pain Intervention(s): Limited activity within  patient's tolerance, Monitored during session, Repositioned    Home Living                          Prior Function            PT Goals (current goals can now be found in the care plan section) Acute Rehab PT Goals Patient Stated Goal: regain strength and independence, take care of kids PT Goal Formulation: With patient Time For Goal Achievement: 07/05/23 Potential to Achieve Goals: Fair Progress towards PT goals: Not progressing toward goals - comment    Frequency    Min 1X/week      PT Plan      Co-evaluation              AM-PAC PT 6 Clicks Mobility   Outcome Measure   Help needed turning from your back to your side while in a flat bed without using bedrails?: None Help needed moving from lying on your back to sitting on the side of a flat bed without using bedrails?: A Little Help needed moving to and from a bed to a chair (including a wheelchair)?: A Little Help needed standing up from a chair using your arms (e.g., wheelchair or bedside chair)?: A Lot Help needed to walk in hospital room?: Total Help needed climbing 3-5 steps with a railing? : Total 6 Click Score: 14    End of Session Equipment Utilized During Treatment: Gait belt Activity Tolerance: Treatment limited secondary to medical complications (Comment) (dizzy in sitting, orthostatic) Patient left: in bed;with call bell/phone within reach Nurse Communication: Mobility status PT Visit Diagnosis: Muscle weakness (generalized) (M62.81);Pain;Unsteadiness on feet (R26.81)     Time: 8596-8573 PT Time Calculation (min) (ACUTE ONLY): 23 min  Charges:    $Therapeutic Activity: 23-37 mins PT General Charges $$ ACUTE PT VISIT: 1 Visit                     Sylvan Delon Copp PT 07/02/2023  Acute Rehabilitation Services  Office [phone]

## 2023-07-02 NOTE — Plan of Care (Signed)
  Problem: Education: Goal: Knowledge of General Education information will improve Description: Including pain rating scale, medication(s)/side effects and non-pharmacologic comfort measures Outcome: Progressing   Problem: Health Behavior/Discharge Planning: Goal: Ability to manage health-related needs will improve Outcome: Progressing   Problem: Clinical Measurements: Goal: Ability to maintain clinical measurements within normal limits will improve Outcome: Progressing Goal: Will remain free from infection Outcome: Progressing Goal: Diagnostic test results will improve Outcome: Progressing Goal: Respiratory complications will improve Outcome: Progressing Goal: Cardiovascular complication will be avoided Outcome: Progressing   Problem: Activity: Goal: Risk for activity intolerance will decrease Outcome: Progressing   Problem: Coping: Goal: Level of anxiety will decrease Outcome: Progressing   Problem: Elimination: Goal: Will not experience complications related to bowel motility Outcome: Progressing   Problem: Pain Management: Goal: General experience of comfort will improve Outcome: Progressing   Problem: Safety: Goal: Ability to remain free from injury will improve Outcome: Progressing   Problem: Skin Integrity: Goal: Risk for impaired skin integrity will decrease Outcome: Progressing

## 2023-07-02 NOTE — Progress Notes (Signed)
 Brief Nutrition Support Update  Patient reports doing well yesterday with increased free water  flushes. States he still isn't eating very much but feels well overall. Agreeable to restart tube feeds at 92mL/hr today.  Discussed with MD, plan to restart Osmolite 1.5 at 54mL/hr and assess tolerance/ability to advance.   Interventions: -  Initiate Osmolite 1.5 at 35mL/hr via J-tube.   - Monitor magnesium , potassium, and phosphorus BID for at least 3 days, MD to replete as needed, as pt is at risk for refeeding syndrome.   - Once tolerating at 44mL/hr and patient agreeable to increase, recommend advancing by 10mL Q12-24H to goal rate of 79mL/hr.              - Goal of 75mL/hr provides 2520 kcals, 105g protein, and free water    - Full Liquid diet per MD.  - Ensure Plus High Protein po TID, each supplement provides 350 kcal and 20 grams of protein.   - Monitor weight trends.  Trude Ned RD, LDN Contact via [employer].

## 2023-07-02 NOTE — Progress Notes (Signed)
 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 11/25 - 11/28 presents with dysphagia and abdominal pain was found to have adenocarcinoma of the GE junction.  Set up with hematology outpatient. 12/12 underwent Port-A-Cath placement. 12/13 due to dizziness while in the clinic was referred for direct admission.  Being treated for failure to thrive. 12/17 after failing conservative management with ongoing pain and difficulty swallowing underwent open placement of feeding jejunostomy tube with Dr. Leonor Dawn. 12/18 started on tube feeds Osmolite. 12/23 appears to have developed colonic ileus.  SBO protocol ordered.  EKG was consulted. 12/24 Hb dropped to 5.3.  Received 2 PRBC transfusion.  Recheck Hb 8.0 close to baseline. 12/25.  Patient requested to be off of tube feeding and has been refusing since as it was causing excessive fullness. 12/27 surgery signed off. 12/30.  Colonic ileus improving.  GI signed off. Assessment and Plan: Adult failure to thrive. Moderate protein calorie malnutrition  Body mass index is 28.25 kg/m.  Presents with dizziness and lightheadedness.  In the setting of poor p.o. intake from failure to thrive. Dietitian has been following. Due to poor p.o. intake general surgery was consulted for consideration of feeding jejunostomy tube placement. After ensuring that the patient can receive tube feeding outpatient (due to lack of insurance) patient consented to undergo jejunostomy tube placement. Was started on tube feeding and as the patient developed ileus likely from narcotics due to refusal of bowel regimen patient has been  refusing tube feeds since. Agreeable to initiate free water  at a higher rate.  Unfortunately will not be a candidate for bolus feeding given his jejunostomy tube. Will discuss with regards to cyclical feeding. TPN was considered but currently not initiated since his GI tract appears to be functioning well Continue to educate the patient in regards to importance of nutrition. Initiate prokinetic medications.  Colonic ileus. GI & surgery seeing the patient.  Surgery currently signed off. Last x-ray on 12/28 shows multiple air-fluid level with no significant obstruction. Continue bowel regimen. Add simethicone .  And Reglan . Minimize narcotics. Repeat x-ray shows evidence of improvement.  Cancer-related pain. Palliative care helping out with pain management. Patient is on methadone , dexamethasone , Dilaudid  as needed and Robaxin  injection twice daily. Will monitor progression.  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 Oncology following. Also seen in the hospital. Eventual plan is to initiate outpatient chemotherapy as well as radiation. Currently patient needs to work on his nutritional status as well as functional status which appears to be very poor right now.  Near syncope. Presented with dizziness and lightheadedness as well as near syncope in the clinic while having blood draws. Currently ongoing dizziness reported by the patient. Blood pressure soft.  Will check orthostatic vitals. Telemetry evaluation unremarkable. Carotid Doppler normal. Echocardiogram EF 60-65%, no significant valvular abnormality. Initial presentation could be secondary to uncontrolled pain versus vasovagal event. His ongoing dizziness is likely secondary to pain medications. orthostatic vitals ordered.  Patient refuses.  If patient remains with complaints of dizziness, I would recommend to consider reduction in pain medications.  Anemia secondary to nutritional deficiency as well as chronic GI blood  loss. Hemoglobin and November 13. Hemoglobin dropped down to 5.3 on  12/24 requiring blood transfusion. Currently hemoglobin continues to have a downward trend of 7.2 hemoglobin. Iron  level remains low, 23.  B12 263 relatively low, folic acid  4.1 low. INR elevated 1.6. Initiate multivitamins.  Initiate folic acid  supplementation.  Give IV iron  infusion given patient's on persistent refusal for oral nutrition.  Initiated B12 injections. Transfuse for hemoglobin less than 8 due to ongoing symptoms of dizziness with orthostasis. Avoiding pharmacological DVT prophylaxis.  Patient is aware that this places the patient at a high risk for poor outcome including the DVT.  Goals of care conversation. Patient currently wants to treat what is treatable. Wants to remain full code. Agreeable for tube feed as well as further therapy. Will monitor.   Subjective: No nausea no vomiting.  No fever no chills.  Ongoing dizziness.  Physical Exam: General: in Mild distress, No Rash Cardiovascular: S1 and S2 Present, No Murmur Respiratory: Good respiratory effort, Bilateral Air entry present. No Crackles, No wheezes Abdomen: Bowel Sound present, mild diffuse tenderness Extremities: No edema Neuro: Alert and oriented x3, no new focal deficit  Data Reviewed: I have Reviewed nursing notes, Vitals, and Lab results. Since last encounter, pertinent lab results CBC and BMP   . I have ordered test including CBC and BMP  .   Disposition: Status is: Inpatient Remains inpatient appropriate because: Monitor for improvement in abdominal pain.  Place and maintain sequential compression device Start: 07/01/23 0926 Place TED hose Start: 07/01/23 0921 SCDs Start: 06/14/23 1621   Family Communication: No one at bedside Level of care: Telemetry   Vitals:   07/02/23 0538 07/02/23 1354 07/02/23 1748 07/02/23 1805  BP: 110/69 102/64 109/60 109/63  Pulse: 64 62 67 72  Resp: 16 16 16 18   Temp: 98 F (36.7 C) 98.1 F  (36.7 C) 98 F (36.7 C) 98.4 F (36.9 C)  TempSrc: Oral Oral Oral Oral  SpO2: 96% 94% 96% 97%  Weight:      Height:         Author: Yetta Blanch, MD 07/02/2023 6:51 PM  Please look on [email or web address] to find out who is on call.

## 2023-07-03 ENCOUNTER — Inpatient Hospital Stay (HOSPITAL_COMMUNITY): Payer: Medicaid Other

## 2023-07-03 DIAGNOSIS — R627 Adult failure to thrive: Secondary | ICD-10-CM | POA: Diagnosis not present

## 2023-07-03 LAB — CBC WITH DIFFERENTIAL/PLATELET
Abs Immature Granulocytes: 0.1 10*3/uL — ABNORMAL HIGH (ref 0.00–0.07)
Basophils Absolute: 0 10*3/uL (ref 0.0–0.1)
Basophils Relative: 0 %
Eosinophils Absolute: 0.1 10*3/uL (ref 0.0–0.5)
Eosinophils Relative: 1 %
HCT: 28.8 % — ABNORMAL LOW (ref 39.0–52.0)
Hemoglobin: 8.9 g/dL — ABNORMAL LOW (ref 13.0–17.0)
Immature Granulocytes: 1 %
Lymphocytes Relative: 17 %
Lymphs Abs: 2.4 10*3/uL (ref 0.7–4.0)
MCH: 26.6 pg (ref 26.0–34.0)
MCHC: 30.9 g/dL (ref 30.0–36.0)
MCV: 86 fL (ref 80.0–100.0)
Monocytes Absolute: 1.2 10*3/uL — ABNORMAL HIGH (ref 0.1–1.0)
Monocytes Relative: 8 %
Neutro Abs: 10.9 10*3/uL — ABNORMAL HIGH (ref 1.7–7.7)
Neutrophils Relative %: 73 %
Platelets: 385 10*3/uL (ref 150–400)
RBC: 3.35 MIL/uL — ABNORMAL LOW (ref 4.22–5.81)
RDW: 16.4 % — ABNORMAL HIGH (ref 11.5–15.5)
WBC: 14.7 10*3/uL — ABNORMAL HIGH (ref 4.0–10.5)
nRBC: 0.2 % (ref 0.0–0.2)

## 2023-07-03 LAB — TYPE AND SCREEN
ABO/RH(D): O POS
Antibody Screen: NEGATIVE
Unit division: 0
Unit division: 0

## 2023-07-03 LAB — BPAM RBC
Blood Product Expiration Date: 202501272359
Blood Product Expiration Date: 202501272359
ISSUE DATE / TIME: 202412311737
ISSUE DATE / TIME: 202412312006
Unit Type and Rh: 5100
Unit Type and Rh: 5100

## 2023-07-03 LAB — RENAL FUNCTION PANEL
Albumin: 2.4 g/dL — ABNORMAL LOW (ref 3.5–5.0)
Anion gap: 7 (ref 5–15)
BUN: 17 mg/dL (ref 6–20)
CO2: 23 mmol/L (ref 22–32)
Calcium: 8 mg/dL — ABNORMAL LOW (ref 8.9–10.3)
Chloride: 103 mmol/L (ref 98–111)
Creatinine, Ser: 0.49 mg/dL — ABNORMAL LOW (ref 0.61–1.24)
GFR, Estimated: >60 mL/min (ref >60)
Glucose, Bld: 126 mg/dL — ABNORMAL HIGH (ref 70–99)
Phosphorus: 3.7 mg/dL (ref 2.5–4.6)
Potassium: 3.7 mmol/L (ref 3.5–5.1)
Sodium: 133 mmol/L — ABNORMAL LOW (ref 135–145)

## 2023-07-03 LAB — MAGNESIUM
Magnesium: 2.1 mg/dL (ref 1.7–2.4)
Magnesium: 2 mg/dL (ref 1.7–2.4)

## 2023-07-03 LAB — GLUCOSE, CAPILLARY
Glucose-Capillary: 115 mg/dL — ABNORMAL HIGH (ref 70–99)
Glucose-Capillary: 115 mg/dL — ABNORMAL HIGH (ref 70–99)

## 2023-07-03 LAB — PHOSPHORUS
Phosphorus: 3.7 mg/dL (ref 2.5–4.6)
Phosphorus: 3.6 mg/dL (ref 2.5–4.6)

## 2023-07-03 MED ORDER — IOHEXOL 300 MG/ML  SOLN
100.0000 mL | Freq: Once | INTRAMUSCULAR | Status: AC | PRN
  Administered 2023-07-03: 100 mL via INTRAVENOUS

## 2023-07-03 MED ORDER — METOCLOPRAMIDE HCL 5 MG/ML IJ SOLN
10.0000 mg | Freq: Four times a day (QID) | INTRAMUSCULAR | Status: DC
  Administered 2023-07-03 – 2023-07-08 (×18): 10 mg via INTRAVENOUS
  Filled 2023-07-03 (×18): qty 2

## 2023-07-03 MED ORDER — MECLIZINE HCL 25 MG PO TABS
25.0000 mg | ORAL_TABLET | Freq: Three times a day (TID) | ORAL | Status: DC | PRN
  Administered 2023-07-04: 25 mg via ORAL
  Filled 2023-07-03: qty 1

## 2023-07-03 NOTE — Progress Notes (Signed)
 The patient requested to stop tube feeding temporarily due to abdominal discomfort and would like to restart later in the day. The on-call provider has been notified.

## 2023-07-03 NOTE — Progress Notes (Signed)
 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 11/25 - 11/28 presents with dysphagia and abdominal pain was found to have adenocarcinoma of the GE junction.  Set up with hematology outpatient. 12/12 underwent Port-A-Cath placement. 12/13 due to dizziness while in the clinic was referred for direct admission.  Being treated for failure to thrive. 12/17 after failing conservative management with ongoing pain and difficulty swallowing underwent open placement of feeding jejunostomy tube with Dr. Leonor Dawn. 12/18 started on tube feeds Osmolite. 12/23 appears to have developed colonic ileus.  SBO protocol ordered.  EKG was consulted. 12/24 Hb dropped to 5.3.  Received 2 PRBC transfusion.  Recheck Hb 8.0 close to baseline. 12/25.  Patient requested to be off of tube feeding and has been refusing since as it was causing excessive fullness. 12/27 surgery signed off. 12/30.  Colonic ileus improving.  GI signed off. 1/1.  Reports abdominal distention after tube feeds, CT unremarkable for any worsening finding on ileus.  Cancer progression.  CT head was performed due to ongoing complaint of dizziness which is also negative.  Palliative care following.  Hematology will follow-up as well.  Prognosis is poor. Assessment and Plan: Adult failure to thrive. Moderate protein calorie malnutrition  Body mass index is 28.25 kg/m.  Presents with dizziness and lightheadedness.  In the setting of poor p.o. intake from failure to thrive. Dietitian has been following. Due to poor p.o. intake general surgery was consulted for consideration of feeding jejunostomy tube  placement. After ensuring that the patient can receive tube feeding outpatient (due to lack of insurance) patient consented to undergo jejunostomy tube placement. Was started on tube feeding and as the patient developed ileus likely from narcotics due to refusal of bowel regimen patient has been refusing tube feeds since. Will not be a candidate for bolus feeding given his jejunostomy tube. TPN was considered but currently not initiated since his GI tract appears to be functioning well Continue Reglan .  Patient was started on tube feeds.  At 5:30 AM on 1/1 patient states that he does not feel comfortable getting the tube feeds and asked to stop it.  Extensive education was provided to the patient.  CT of the abdomen was performed as well to ensure that there is no worsening of his colonic ileus which appears to be reassuring. Will discuss with restarting tube feeds tomorrow.  Colonic ileus. GI & surgery seeing the patient.  Surgery currently signed off. Last x-ray on 12/28 shows multiple air-fluid level with no significant obstruction. Repeat CT was performed on 01/1 due to patient's ongoing complaint of nausea which shows unchanged ileus. For now we will continue with IV Reglan . Will discuss with tube feeds tomorrow.  Cancer-related pain. Palliative care helping out with pain management. Patient is on methadone , dexamethasone , Dilaudid  as needed and Robaxin  injection twice daily. Continue current pain control.  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 Oncology following. Also seen in the hospital. Eventual plan is to initiate outpatient chemotherapy as well as radiation. Currently patient needs to work on his nutritional status as well as functional status which appears to be very poor right now.  Near syncope. Presented with dizziness and lightheadedness as well as  near syncope in the clinic while having blood draws. Currently ongoing dizziness reported by the patient. Blood  pressure soft.  Will check orthostatic vitals. Telemetry evaluation unremarkable. Carotid Doppler normal. Echocardiogram EF 60-65%, no significant valvular abnormality. Initial presentation could be secondary to uncontrolled pain versus vasovagal event. His ongoing dizziness is likely secondary to pain medications. orthostatic vitals initially refused by the patient but on a recheck were low. Improved on 1/1. Continue TED stockings. Meclizine  initiated.  Anemia secondary to nutritional deficiency as well as chronic GI blood loss. Hemoglobin and November 13. Hemoglobin dropped down to 5.3 on 12/24 requiring blood transfusion. Currently hemoglobin continues to have a downward trend of 7.2 hemoglobin. Iron  level remains low, 23.  B12 263 relatively low, folic acid  4.1 low. INR elevated 1.6. Initiate multivitamins.  Initiate folic acid  supplementation.  Give IV iron  infusion given patient's on persistent refusal for oral nutrition.  Initiated B12 injections. Transfuse for hemoglobin less than 8 due to ongoing symptoms of dizziness with orthostasis. Avoiding pharmacological DVT prophylaxis.  Patient is aware that this places the patient at a high risk for poor outcome including the DVT.  Goals of care conversation. Patient currently wants to treat what is treatable. Wants to remain full code. Intermittently agreeable for medical intervention including tube feeds and then refuses. Currently does not appear to be a good candidate for chemotherapy due to his performance status. At present recommendation is that the patient is good candidate for consideration of home hospice given his reluctance in initiating tube feeds. Cannot rule out possibility of a acute grief reaction/depression as well. Regardless prognosis is poor.  Without any adequate nutrition could be few months only. Extensive discussion with mother-in-law on 1/1.   Subjective: No nausea no vomiting no fever no chills.  Early in  the morning requested to stop tube feeds as he was not feeling good.  Physical Exam: General: in Mild distress, No Rash Cardiovascular: S1 and S2 Present, No Murmur Respiratory: Good respiratory effort, Bilateral Air entry present. No Crackles, No wheezes Abdomen: Bowel Sound present, distended, no new tenderness Extremities: No edema Neuro: Alert and oriented x3, no new focal deficit  Data Reviewed: I have Reviewed nursing notes, Vitals, and Lab results. Since last encounter, pertinent lab results CBC and BMP   . I have ordered test including CBC and BMP  . I have discussed pt's care plan and test results with palliative care and hematology  .  Ordered CT head and CT abdomen.  Disposition: Status is: Inpatient Remains inpatient appropriate because: Monitor for further ferritin goal of care  Place and maintain sequential compression device Start: 07/01/23 0926 Place TED hose Start: 07/01/23 0921 SCDs Start: 06/14/23 1621   Family Communication: Discussed with mother-in-law.  Patient provided permission to discuss with her. Level of care: Telemetry   Vitals:   07/02/23 2030 07/02/23 2236 07/03/23 0402 07/03/23 1312  BP: 107/68 105/70 119/75 111/75  Pulse: 68 73 66 62  Resp: 14 14 18 16   Temp: 98.2 F (36.8 C) 98.6 F (37 C) 97.9 F (36.6 C) 97.9 F (36.6 C)  TempSrc: Oral Oral Oral Oral  SpO2: 98% 94% 97% 96%  Weight:      Height:         Author: Yetta Blanch, MD 07/03/2023 5:44 PM  Please look on [email or web address] to find out who is on call.

## 2023-07-03 NOTE — Progress Notes (Signed)
 Patient ID: Jesus Haynes, male   DOB: 1975/09/23, 48 y.o.   MRN: [id number] Continue bowel regimen and intermittent tube feeds as tolerated. No acute surgical issues.  Call us  if needed.  Jesus Haynes. Belinda, MD, [facility]  General Surgery   07/03/2023 7:25 AM

## 2023-07-03 NOTE — Plan of Care (Signed)
   Problem: Education: Goal: Knowledge of General Education information will improve Description Including pain rating scale, medication(s)/side effects and non-pharmacologic comfort measures Outcome: Progressing

## 2023-07-04 DIAGNOSIS — R627 Adult failure to thrive: Secondary | ICD-10-CM | POA: Diagnosis not present

## 2023-07-04 LAB — RENAL FUNCTION PANEL
Albumin: 2.7 g/dL — ABNORMAL LOW (ref 3.5–5.0)
Anion gap: 8 (ref 5–15)
BUN: 14 mg/dL (ref 6–20)
CO2: 22 mmol/L (ref 22–32)
Calcium: 8.2 mg/dL — ABNORMAL LOW (ref 8.9–10.3)
Chloride: 100 mmol/L (ref 98–111)
Creatinine, Ser: 0.51 mg/dL — ABNORMAL LOW (ref 0.61–1.24)
GFR, Estimated: >60 mL/min (ref >60)
Glucose, Bld: 118 mg/dL — ABNORMAL HIGH (ref 70–99)
Phosphorus: 4.5 mg/dL (ref 2.5–4.6)
Potassium: 3.4 mmol/L — ABNORMAL LOW (ref 3.5–5.1)
Sodium: 130 mmol/L — ABNORMAL LOW (ref 135–145)

## 2023-07-04 LAB — CBC WITH DIFFERENTIAL/PLATELET
Abs Immature Granulocytes: 0.12 10*3/uL — ABNORMAL HIGH (ref 0.00–0.07)
Basophils Absolute: 0 10*3/uL (ref 0.0–0.1)
Basophils Relative: 0 %
Eosinophils Absolute: 0.1 10*3/uL (ref 0.0–0.5)
Eosinophils Relative: 1 %
HCT: 28.3 % — ABNORMAL LOW (ref 39.0–52.0)
Hemoglobin: 9.1 g/dL — ABNORMAL LOW (ref 13.0–17.0)
Immature Granulocytes: 1 %
Lymphocytes Relative: 17 %
Lymphs Abs: 2.5 10*3/uL (ref 0.7–4.0)
MCH: 27.5 pg (ref 26.0–34.0)
MCHC: 32.2 g/dL (ref 30.0–36.0)
MCV: 85.5 fL (ref 80.0–100.0)
Monocytes Absolute: 1 10*3/uL (ref 0.1–1.0)
Monocytes Relative: 7 %
Neutro Abs: 10.5 10*3/uL — ABNORMAL HIGH (ref 1.7–7.7)
Neutrophils Relative %: 74 %
Platelets: 388 10*3/uL (ref 150–400)
RBC: 3.31 MIL/uL — ABNORMAL LOW (ref 4.22–5.81)
RDW: 16.9 % — ABNORMAL HIGH (ref 11.5–15.5)
WBC: 14.1 10*3/uL — ABNORMAL HIGH (ref 4.0–10.5)
nRBC: 0 % (ref 0.0–0.2)

## 2023-07-04 LAB — PHOSPHORUS: Phosphorus: 4 mg/dL (ref 2.5–4.6)

## 2023-07-04 MED ORDER — POTASSIUM CHLORIDE 20 MEQ PO PACK
60.0000 meq | PACK | Freq: Once | ORAL | Status: AC
  Administered 2023-07-04: 60 meq
  Filled 2023-07-04: qty 3

## 2023-07-04 MED ORDER — POTASSIUM CHLORIDE 20 MEQ PO PACK
40.0000 meq | PACK | Freq: Every day | ORAL | Status: DC
  Administered 2023-07-05 – 2023-07-08 (×4): 40 meq
  Filled 2023-07-04 (×4): qty 2

## 2023-07-04 MED ORDER — OSMOLITE 1.5 CAL PO LIQD
1000.0000 mL | ORAL | Status: AC
  Administered 2023-07-04: 1000 mL
  Filled 2023-07-04: qty 1000

## 2023-07-04 MED ORDER — DIAZEPAM 1 MG/ML PO SOLN
2.0000 mg | Freq: Two times a day (BID) | ORAL | Status: DC
  Administered 2023-07-04 – 2023-07-08 (×9): 2 mg
  Filled 2023-07-04 (×9): qty 5

## 2023-07-04 MED ORDER — SCOPOLAMINE 1 MG/3DAYS TD PT72: 1.0000 | MEDICATED_PATCH | TRANSDERMAL | Status: DC

## 2023-07-04 NOTE — Progress Notes (Signed)
 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 11/25 - 11/28 presents with dysphagia and abdominal pain was found to have adenocarcinoma of the GE junction.  Set up with hematology outpatient. 12/12 underwent Port-A-Cath placement. 12/13 due to dizziness while in the clinic was referred for direct admission.  Being treated for failure to thrive. 12/17 after failing conservative management with ongoing pain and difficulty swallowing underwent open placement of feeding jejunostomy tube with Dr. Leonor Dawn. 12/18 started on tube feeds Osmolite. 12/23 appears to have developed colonic ileus.  SBO protocol ordered.  EKG was consulted. 12/24 Hb dropped to 5.3.  Received 2 PRBC transfusion.  Recheck Hb 8.0 close to baseline. 12/25.  Patient requested to be off of tube feeding and has been refusing since as it was causing excessive fullness. 12/27 surgery signed off. 12/30.  Colonic ileus improving.  GI signed off. 1/1.  Reports abdominal distention after tube feeds, CT unremarkable for any worsening finding on ileus.  Cancer progression.  CT head was performed due to ongoing complaint of dizziness which is also negative.  Palliative care following.  Hematology will follow-up as well.  Prognosis is poor. 1/2.  Extensive discussion with patient at bedside with hematology.  Prognosis poor.  Not agreed for chemotherapy right now.  Initiating on tube feeds with plan to stop at nighttime.  Hospice was also recommended  Assessment and Plan: Adult failure to thrive. Moderate protein calorie malnutrition  Body mass index is 28.25 kg/m.  Presents with dizziness and  lightheadedness.  In the setting of poor p.o. intake from failure to thrive. Dietitian has been following. Due to poor p.o. intake general surgery was consulted for consideration of feeding jejunostomy tube placement. After ensuring that the patient can receive tube feeding outpatient (due to lack of insurance) patient consented to undergo jejunostomy tube placement. Was started on tube feeding and as the patient developed ileus likely from narcotics due to refusal of bowel regimen patient has been refusing tube feeds since. Will not be a candidate for bolus feeding given his jejunostomy tube. TPN was considered but currently not initiated since his GI tract appears to be functioning well Continue Reglan .  Patient was started on tube feeds.  At 5:30 AM on 1/1 patient states that he does not feel comfortable getting the tube feeds and asked to stop it.  Extensive education was provided to the patient.  CT of the abdomen was performed as well to ensure that there is no worsening of his colonic ileus which appears to be reassuring. Will restart tube feed and monitor tolerance.  Colonic ileus. GI & surgery seeing the patient.  Surgery currently signed off. Last x-ray on 12/28 shows multiple air-fluid level with no significant obstruction. Repeat CT was performed on 01/1 due to patient's ongoing complaint of nausea which shows unchanged ileus. For now we will continue with IV Reglan . Monitor tube feed tolerance.  Had a BM on 1/2.  Cancer-related pain. Palliative care helping out with pain management. Patient is on methadone , dexamethasone , Dilaudid  as needed and Robaxin  injection twice daily. Continue current pain control. Switch IV Robaxin  to Valium  scheduled for ongoing dizziness.  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 Oncology  following. Also seen in the hospital. Eventual plan is to initiate outpatient chemotherapy as well as radiation. Currently patient needs to work on his  nutritional status as well as functional status which appears to be very poor right now.  Near syncope. Presented with dizziness and lightheadedness as well as near syncope in the clinic while having blood draws. Currently ongoing dizziness reported by the patient. Blood pressure soft.  Will check orthostatic vitals. Telemetry evaluation unremarkable. Carotid Doppler normal. Echocardiogram EF 60-65%, no significant valvular abnormality. Initial presentation could be secondary to uncontrolled pain versus vasovagal event. His ongoing dizziness is likely secondary to pain medications. Continue TED stockings. Meclizine  initiated.   Anemia secondary to nutritional deficiency as well as chronic GI blood loss. Hemoglobin and November 13. Hemoglobin dropped down to 5.3 on 12/24 requiring blood transfusion. Currently hemoglobin continues to have a downward trend of 7.2 hemoglobin. Iron  level remains low, 23.  B12 263 relatively low, folic acid  4.1 low. INR elevated 1.6. Initiate multivitamins.  Initiate folic acid  supplementation.  Give IV iron  infusion given patient's on persistent refusal for oral nutrition.  Initiated B12 injections. Transfuse for hemoglobin less than 8 due to ongoing symptoms of dizziness with orthostasis. Avoiding pharmacological DVT prophylaxis.  Patient is aware that this places the patient at a high risk for poor outcome including the DVT.  Goals of care conversation. Patient currently wants to treat what is treatable. Wants to remain full code. Intermittently agreeable for medical intervention including tube feeds and then refuses. Currently does not appear to be a good candidate for chemotherapy due to his performance status. At present recommendation is that the patient is good candidate for consideration of home hospice given his reluctance in initiating tube feeds. Cannot rule out possibility of a acute grief reaction/depression as well. Regardless prognosis is  poor.  Without any adequate nutrition could be few months only. Extensive discussion with mother-in-law on 1/1.  And again on 1/2. Currently initiating tube feeding. If unable to tolerate tube feed at goal rate, patient was recommended to be on hospice given his poor prognosis.  TPN was discussed although not recommended given poor outcomes.  Subjective: No nausea.  Had a BM.  Pain actually more severe today.  Physical Exam: Bowel sound present. S1-S2 present In mild distress Alert awake and oriented.  Data Reviewed: I have Reviewed nursing notes, Vitals, and Lab results. Reviewed CBC and CMP.  Discussed with oncology.  Disposition: Status is: Inpatient Remains inpatient appropriate because: Monitor for diet tolerance versus clarity on goals of care.  Place and maintain sequential compression device Start: 07/01/23 0926 Place TED hose Start: 07/01/23 0921 SCDs Start: 06/14/23 1621   Family Communication: Discussed with mother-in-law.  Patient provided permission to discuss with her. Level of care: Telemetry   Vitals:   07/03/23 2217 07/04/23 0635 07/04/23 0636 07/04/23 1354  BP: 107/62  110/74 119/67  Pulse: 66  62 72  Resp: 15  16 20   Temp: 98.3 F (36.8 C)  97.6 F (36.4 C) 97.7 F (36.5 C)  TempSrc: Oral  Oral Oral  SpO2: 96%  96% 97%  Weight:  80.9 kg    Height:         Author: Yetta Blanch, MD 07/04/2023 9:04 PM  Please look on [email or web address] to find out who is on call.

## 2023-07-04 NOTE — Plan of Care (Signed)
  Problem: Education: Goal: Knowledge of General Education information will improve Description: Including pain rating scale, medication(s)/side effects and non-pharmacologic comfort measures Outcome: Progressing   Problem: Health Behavior/Discharge Planning: Goal: Ability to manage health-related needs will improve Outcome: Progressing   Problem: Clinical Measurements: Goal: Ability to maintain clinical measurements within normal limits will improve Outcome: Progressing Goal: Will remain free from infection Outcome: Progressing Goal: Diagnostic test results will improve Outcome: Progressing Goal: Respiratory complications will improve Outcome: Progressing Goal: Cardiovascular complication will be avoided Outcome: Progressing   Problem: Activity: Goal: Risk for activity intolerance will decrease Outcome: Progressing   Problem: Nutrition: Goal: Adequate nutrition will be maintained Outcome: Progressing   Problem: Elimination: Goal: Will not experience complications related to bowel motility Outcome: Progressing Goal: Will not experience complications related to urinary retention Outcome: Progressing   Problem: Pain Management: Goal: General experience of comfort will improve Outcome: Progressing

## 2023-07-04 NOTE — Progress Notes (Signed)
 PT Cancellation Note  Patient Details Name: [name] MRN: [id number] DOB: 10/09/1975   Cancelled Treatment:    Reason Eval/Treat Not Completed: Fatigue/lethargy limiting ability to participate (pt declined mobilty at this time, he had visitors and just recently got up to the bedside commode, so is fatigued. Will follow.)   Sylvan Delon Copp PT 07/04/2023  Acute Rehabilitation Services  Office [phone]

## 2023-07-04 NOTE — Progress Notes (Signed)
 [facility]  HEMATOLOGY/ONCOLOGY IN-PATIENT PROGRESS NOTE   PATIENT NAME: Jesus Haynes   MR#: [id number] DOB: 11-25-1975 CSN#: [id number]   DATE OF SERVICE: 07/04/2023  ASSESSMENT & PLAN:   48 y.o. 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  We were originally planning to start him on palliative systemic treatments with FOLFOX plus nivolumab .  However when we saw him in clinic on 06/14/2023, his performance status was significantly down and he had failure to thrive.  Hence he was hospitalized for further management.  Since being hospitalized, he has had a feeding tube placement but has been occasionally refusing tube feeds because it is causing him to have nausea and vomiting, likely from ileus.  Ileus could be from malignancy diagnosis, IV pain medications and his functional status.  He has not been able to work with physical therapy because of dizziness.  He also continues to have anemia, presumably from GI related blood loss.  Today I had extensive discussion with patient, his mother-in-law by the bedside.  His performance status has rather declined during the hospital stay.  His ECOG PS is 3 at this time which is a contraindication for systemic chemotherapy.  We need his performance status to improve to at least PS 2, which would mean that he needs to stay out of the bed more than half the time during the day independently.  Only then, we can attempt to do chemotherapy.  Realistically speaking, this does not seem to be possible, unless patient can improve his nutritional status and continue to work with physical therapy.  If his performance status does not improve, I suggested that he strongly consider enrolling in hospice.  Patient is contemplating this.  His mother-in-law, who has  been thoroughly involved in his care, understands and agrees with this plan.  Dr. Yetta Blanch was also in the room when we were discussing these recommendations and is on board with the plan.  We greatly appreciate palliative care team assistance in his management.  I will be available to answer any questions or concerns.   Chinita Patten, MD 07/04/2023 1:37 PM  PERTINENT HISTORY:  48 y.o. gentleman with history of asthma, presented to the ED on 05/27/2023 with complaints of epigastric burning abdominal pain, nausea, retching.  He also reported some dark tarry stools for 3 days prior to arrival.  He was previously seen in the ED at [facility] on 05/08/2023 with complaints of chest or right upper quadrant abdominal pain.  Workup was unremarkable at that time and he was sent home with Protonix  and Carafate .  Since his symptoms persisted, he presented to the ED again.   In the ED, his hemoglobin was noted to be down to 10, compared to 13 previously.  With concern for upper GI bleed, was admitted for further evaluation and management.   Dr. Magod performed upper GI endoscopy on 05/28/2023.  It showed partially obstructing, likely malignant esophageal tumor in the lower third of the esophagus.  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  Normal duodenum.  Pathology from GE junction mass came back positive for invasive adenocarcinoma, moderately to poorly differentiated.  Immunostains pending.   CT chest abdomen pelvis on 05/28/2023 showed abnormal wall thickening at the GE junction, compatible with patient's known carcinoma.  Prominent lymph nodes in the retrocrural region on the right side, adjacent  to the hiatal hernia.  Abnormal low-density soft tissue in the gastrohepatic region, abutting the lesser curvature and proximal stomach, worrisome for metastatic disease.  Periportal, periceliac, gastrosplenic and left retroperitoneal lymphadenopathy, worrisome for metastatic involvement.  Hypodensities  in the liver were also noted, worrisome for metastatic disease, largest 1 measuring 13 mm in the inferior right lobe.  Fat stranding surrounding the body and tail of pancreas, suggesting acute pancreatitis.   We were consulted during his hospitalization on 05/29/2023 for additional recommendations given new diagnosis of GE junction adenocarcinoma.   On 06/07/2023, staging PET/CT showed large hypermetabolic mass involving the distal esophagus and proximal stomach consistent with known primary esophageal carcinoma.  Multiple hypermetabolic retroperitoneal, lower mediastinal lymph nodes consistent with locally advanced disease.  Suspected direct tumor extension into the pancreatic body.  Hypermetabolic node or peritoneal implant within the omentum.  No other evidence of metastatic disease.   Plan was for palliative systemic treatments with FOLFOX plus nivolumab .  However patient's performance status continued to decline and he had to be hospitalized again on 06/14/2023.  SUBJECTIVE:   He has been struggling with dizziness and lightheadedness, particularly during physical therapy sessions. The patient has expressed a desire to try tube feeds, despite previous difficulties with tolerance. The patient's mother-in-law has been actively involved in the discussions and decision-making process, seeking to understand the patient's condition and the potential outcomes of different treatment options. The patient's condition has been complicated by the presence of an ileus, likely due to the effects of his illness and pain medication. The patient's strength and ability to be up and about have been significantly reduced.  OBJECTIVE:  Vitals:   07/03/23 2217 07/04/23 0636  BP: 107/62 110/74  Pulse: 66 62  Resp: 15 16  Temp: 98.3 F (36.8 C) 97.6 F (36.4 C)  SpO2: 96% 96%     Intake/Output Summary (Last 24 hours) at 07/04/2023 1337 Last data filed at 07/04/2023 1056 Gross per 24 hour  Intake --  Output 1350  ml  Net -1350 ml    Physical Exam Constitutional:      Appearance: He is ill-appearing.  Abdominal:     General: There is distension (mild).     Comments: Feeding tube in place  Neurological:     Mental Status: He is alert and oriented to person, place, and time.  Psychiatric:        Mood and Affect: Mood normal.        Behavior: Behavior normal.     LABS:   Results for orders placed or performed during the hospital encounter of 06/14/23 (from the past 24 hours)  Magnesium      Status: None   Collection Time: 07/03/23  5:20 PM  Result Value Ref Range   Magnesium  2.0 1.7 - 2.4 mg/dL  Phosphorus     Status: None   Collection Time: 07/03/23  5:20 PM  Result Value Ref Range   Phosphorus 3.6 2.5 - 4.6 mg/dL  CBC with Differential/Platelet     Status: Abnormal   Collection Time: 07/04/23  6:05 AM  Result Value Ref Range   WBC 14.1 (H) 4.0 - 10.5 K/uL   RBC 3.31 (L) 4.22 - 5.81 MIL/uL   Hemoglobin 9.1 (L) 13.0 - 17.0 g/dL   HCT 71.6 (L) 60.9 - 47.9 %   MCV 85.5 80.0 - 100.0 fL   MCH 27.5 26.0 - 34.0 pg   MCHC 32.2 30.0 - 36.0 g/dL   RDW 83.0 (H) 88.4 - 84.4 %  Platelets 388 150 - 400 K/uL   nRBC 0.0 0.0 - 0.2 %   Neutrophils Relative % 74 %   Neutro Abs 10.5 (H) 1.7 - 7.7 K/uL   Lymphocytes Relative 17 %   Lymphs Abs 2.5 0.7 - 4.0 K/uL   Monocytes Relative 7 %   Monocytes Absolute 1.0 0.1 - 1.0 K/uL   Eosinophils Relative 1 %   Eosinophils Absolute 0.1 0.0 - 0.5 K/uL   Basophils Relative 0 %   Basophils Absolute 0.0 0.0 - 0.1 K/uL   Immature Granulocytes 1 %   Abs Immature Granulocytes 0.12 (H) 0.00 - 0.07 K/uL  Renal function panel     Status: Abnormal   Collection Time: 07/04/23  6:05 AM  Result Value Ref Range   Sodium 130 (L) 135 - 145 mmol/L   Potassium 3.4 (L) 3.5 - 5.1 mmol/L   Chloride 100 98 - 111 mmol/L   CO2 22 22 - 32 mmol/L   Glucose, Bld 118 (H) 70 - 99 mg/dL   BUN 14 6 - 20 mg/dL   Creatinine, Ser 9.48 (L) 0.61 - 1.24 mg/dL   Calcium  8.2 (L) 8.9  - 10.3 mg/dL   Phosphorus 4.5 2.5 - 4.6 mg/dL   Albumin 2.7 (L) 3.5 - 5.0 g/dL   GFR, Estimated >39 >39 mL/min   Anion gap 8 5 - 15     IMAGING STUDIES:   CT ABDOMEN PELVIS W CONTRAST Result Date: 07/03/2023 CLINICAL DATA:  Bowel obstruction, colonic ileus on previous x-ray, history of adenocarcinoma at the gastroesophageal junction EXAM: CT ABDOMEN AND PELVIS WITH CONTRAST TECHNIQUE: Multidetector CT imaging of the abdomen and pelvis was performed using the standard protocol following bolus administration of intravenous contrast. RADIATION DOSE REDUCTION: This exam was performed according to the departmental dose-optimization program which includes automated exposure control, adjustment of the mA and/or kV according to patient size and/or use of iterative reconstruction technique. CONTRAST:  OMNIPAQUE  IOHEXOL  300 MG/ML  SOLN COMPARISON:  06/26/2023, 06/07/2023 FINDINGS: Lower chest: Trace bilateral pleural effusions with dependent lower lobe atelectasis. Infiltrative soft tissue mass involving the distal esophagus and proximal stomach is again noted. Posterior mediastinal adenopathy measuring up to 12 mm consistent with metastatic disease. Hepatobiliary: Stable hepatic cysts. Otherwise unremarkable appearance of the liver and gallbladder. Pancreas: The infiltrative mass at the gastroesophageal junction involves the superior margin of the pancreatic body, similar to prior exam. This area measures approximately 5.2 x 4.2 cm reference image 32/3. No pancreatic duct dilation. Spleen: Normal in size without focal abnormality. Adrenals/Urinary Tract: Adrenal glands are unremarkable. Kidneys are normal, without renal calculi, focal lesion, or hydronephrosis. Bladder is unremarkable. Stomach/Bowel: Infiltrative mass involving the distal esophagus, gastric cardia, and gastric fundus is again identified, consistent with known history of malignancy. There is increased surrounding mesenteric stranding, as well  is progressive irregular mural thickening of the gastric fundus and body. There is a percutaneous jejunostomy catheter in stable position. No evidence of small-bowel obstruction. Continued diffuse colonic distension with oral contrast and gas fluid levels throughout the colon, consistent with adynamic colonic ileus. Normal appendix right lower quadrant. Vascular/Lymphatic: Lymphadenopathy is seen along the stomach, gastric hepatic ligament, and porta hepatis. Index lymph nodes are as follows: Porta hepatis, image 31/3, 18 mm.  Previously 14 mm. Anterior mesenteric, image 29/3, 19 mm.  Previously 17 mm. Retroperitoneal, image 42/3, 15 mm.  Previously 15 mm. 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 Reproductive: Prostate is unremarkable.  Other: Trace right upper quadrant ascites. No free intraperitoneal gas. No abdominal wall hernia. Musculoskeletal: No acute or destructive bony abnormalities. Reconstructed images demonstrate no additional findings. IMPRESSION: 1. Large infiltrative mass centered at the gastric cardia, involving the distal esophagus, gastric fundus/body, and superior margin of the pancreas. Overall increase in surrounding mesenteric stranding as well as irregular mural thickening of the stomach consistent with disease progression. 2. Metastatic lymphadenopathy within the retroperitoneum, upper abdomen, and posterior mediastinum, slightly progressive since prior exam. 3. Trace bilateral pleural effusions. 4. Diffuse colonic distension, with oral contrast throughout the colon as well as diffuse gas fluid levels, consistent with adynamic colonic ileus. No evidence of small-bowel obstruction. 5. Stable percutaneous jejunostomy tube. 6. Trace ascites. Electronically Signed   By: Ozell Daring M.D.   On: 07/03/2023 15:25   CT HEAD WO CONTRAST ( ) Result Date: 07/03/2023 CLINICAL DATA:  Syncope/presyncope, cerebrovascular cause suspected. EXAM:  CT HEAD WITHOUT CONTRAST TECHNIQUE: Contiguous axial images were obtained from the base of the skull through the vertex without intravenous contrast. RADIATION DOSE REDUCTION: This exam was performed according to the departmental dose-optimization program which includes automated exposure control, adjustment of the mA and/or kV according to patient size and/or use of iterative reconstruction technique. COMPARISON:  None Available. FINDINGS: Brain: No acute intracranial hemorrhage. Gray-white differentiation is preserved. No hydrocephalus or extra-axial collection. No mass effect or midline shift. Vascular: No hyperdense vessel or unexpected calcification. Skull: No calvarial fracture or suspicious bone lesion. Skull base is unremarkable. Sinuses/Orbits: No acute finding. Other: None. IMPRESSION: No acute intracranial abnormality. Electronically Signed   By: Ryan Chess M.D.   On: 07/03/2023 15:10   DG Abd 2 Views Result Date: 07/01/2023 CLINICAL DATA:  Ileus. EXAM: ABDOMEN - 2 VIEW COMPARISON:  Abdominal x-ray dated June 29, 2023. FINDINGS: Jejunostomy tube again noted. Unchanged gaseous distention of the colon with air-fluid levels. Interval partial clearing of the colonic oral contrast. No obvious small bowel dilatation. No acute osseous abnormality. IMPRESSION: 1. Unchanged colonic ileus. Electronically Signed   By: Elsie ONEIDA Shoulder M.D.   On: 07/01/2023 14:08   DG Abd 2 Views Result Date: 06/29/2023 CLINICAL DATA:  48 year old male with history of ileus. EXAM: ABDOMEN - 2 VIEW COMPARISON:  Abdominal radiograph 06/24/2023. FINDINGS: Oral contrast material is noted in the colon and rectum. Multiple air-fluid levels are noted on the upright projection, however, these appear to be colonic. No definite pathologic dilatation of small bowel noted. No pneumoperitoneum. Percutaneous gastrostomy tube noted projecting over the left side of the abdomen. IMPRESSION: 1. Nonobstructive bowel gas pattern. 2. No  pneumoperitoneum. Electronically Signed   By: Toribio Aye M.D.   On: 06/29/2023 08:56   CT ABDOMEN PELVIS W CONTRAST Result Date: 06/26/2023 CLINICAL DATA:  History of gastroesophageal junction tumor with acute abdominal pain status post J-tube placement for failure to thrive. * Tracking Code: BO * EXAM: CT ABDOMEN AND PELVIS WITH CONTRAST TECHNIQUE: Multidetector CT imaging of the abdomen and pelvis was performed using the standard protocol following bolus administration of intravenous contrast. RADIATION DOSE REDUCTION: This exam was performed according to the departmental dose-optimization program which includes automated exposure control, adjustment of the mA and/or kV according to patient size and/or use of iterative reconstruction technique. CONTRAST:  OMNIPAQUE  IOHEXOL  300 MG/ML  SOLN COMPARISON:  Nuclear medicine PET dated 06/07/2023, CT abdomen and pelvis dated 05/28/2023 FINDINGS: Lower chest: Bilateral lower lobe subsegmental atelectasis. New trace right pleural effusion. Partially imaged heart size is normal. Hepatobiliary: Scattered hepatic hypodensities  measuring up to 12 mm in segment 5/6 (2:28), likely cysts. No intra or extrahepatic biliary ductal dilation. Normal gallbladder. Pancreas: Diffuse thickening of the pancreas is again seen with heterogeneous enhancement, inseparable from hypoenhancing mass extending inferiorly from the gastric cardia into the pancreatic neck/body. In conglomerate, this area measures approximately 5.4 x 4.6 cm (2:26), previously 4.8 by 3.6 cm. Spleen: Normal in size without focal abnormality. Adrenals/Urinary Tract: No adrenal nodules. No suspicious renal mass, calculi or hydronephrosis. No focal bladder wall thickening. Stomach/Bowel: Infiltrative soft tissue mass of the gastroesophageal junction in keeping with known malignancy. With tip terminating in the right upper quadrant. The small bowel along the jejunostomy catheter is decompressed. Enteric  contrast material is present throughout the colon. The ascending colon is diffusely dilated. Normal appendix. 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 Aortic atherosclerosis. Multi station upper abdominal and retroperitoneal lymphadenopathy is again seen. A few lymph nodes are markedly increased in size, for example 14 mm periportal (2:25), previously 7 mm and 17 mm anterior midline mesenteric (2:22), previously 11 mm, and others similar to minimally increased in size, for example for example 12 mm paraesophageal (2:12), previously 10 mm and 10 mm right retrocrural (2:23), unchanged. Reproductive: Prostate is unremarkable. Other: Trace ascites.  No free air or fluid collection. Musculoskeletal: No acute or abnormal lytic or blastic osseous lesions. Small fat-containing paraumbilical and bilateral inguinal hernias. IMPRESSION: 1. Infiltrative soft tissue mass of the gastroesophageal junction in keeping with known malignancy with interval increase in size of hypoenhancing mass extending inferiorly from the gastric cardia into the pancreatic neck/body. 2. Interval placement of jejunostomy catheter with tip terminating in the right upper quadrant. The small bowel along the jejunostomy catheter is decompressed and not well evaluated. The ascending colon is diffusely dilated, likely ileus. 3. Interval increase in size of multi station upper abdominal and retroperitoneal lymphadenopathy, in keeping with metastatic disease. 4. New trace right pleural effusion. 5. Aortic Atherosclerosis (ICD10-I70.0). Electronically Signed   By: Limin  Xu M.D.   On: 06/26/2023 18:01   DG Abd Portable 1V-Small Bowel Obstruction Protocol-initial, 8 hr delay Result Date: 06/25/2023 CLINICAL DATA:  8 hour delay for small bowel obstruction EXAM: PORTABLE ABDOMEN - 1 VIEW COMPARISON:  06/21/2023 FINDINGS: On the 8 hour image, there is contrast throughout the colon into the rectum. Continued  gaseous distention of the colon. No small bowel dilatation. Change in ostomy catheter again noted in the left abdomen. IMPRESSION: Contrast throughout the colon into the rectum. No evidence of small bowel obstruction. Electronically Signed   By: Franky Crease M.D.   On: 06/25/2023 00:16   DG Abd 1 View Result Date: 06/21/2023 CLINICAL DATA:  Constipation EXAM: ABDOMEN - 1 VIEW COMPARISON:  None Available. FINDINGS: Gas-filled, distended colon, without large burden of stool and gas present to the rectum. Percutaneous enterostomy tube projects over the left hemiabdomen. No free air in the abdomen on supine radiographs. IMPRESSION: 1. Gas-filled, distended colon, without large burden of stool and gas present to the rectum. 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 2. Percutaneous enterostomy tube projects over the left hemiabdomen. Electronically Signed   By: Marolyn JONETTA Jaksch M.D.   On: 06/21/2023 17:31   VAS US  CAROTID Result Date: 06/17/2023 Carotid Arterial Duplex Study Patient Name:  TIAN MCMURTREY  Date of Exam:   06/16/2023 Medical Rec #: [id number]       Accession #:    [id number] Date of Birth: 23-Oct-1975  Patient Gender: M Patient Age:   34 years Exam Location:  [facility] Procedure:      VAS US  CAROTID Referring Phys: DAVID ORTIZ --------------------------------------------------------------------------------  Indications:       Syncope. Comparison Study:  No prior exam. Performing Technologist: Edilia Elden Appl  Examination Guidelines: A complete evaluation includes B-mode imaging, spectral Doppler, color Doppler, and power Doppler as needed of all accessible portions of each vessel. Bilateral testing is considered an integral part of a complete examination. Limited examinations for reoccurring indications may be performed as noted.  Right Carotid Findings: +----------+--------+--------+--------+------------------+--------+           PSV cm/sEDV  cm/sStenosisPlaque DescriptionComments +----------+--------+--------+--------+------------------+--------+ CCA Prox  125     26                                         +----------+--------+--------+--------+------------------+--------+ CCA Distal101     25                                         +----------+--------+--------+--------+------------------+--------+ ICA Prox  71      24                                tortuous +----------+--------+--------+--------+------------------+--------+ ICA Mid   75      27                                tortuous +----------+--------+--------+--------+------------------+--------+ ICA Distal63      27                                tortuous +----------+--------+--------+--------+------------------+--------+ ECA       81      13                                         +----------+--------+--------+--------+------------------+--------+ +----------+--------+-------+------------+-------------------+           PSV cm/sEDV cmsDescribe    Arm Pressure (mmHG) +----------+--------+-------+------------+-------------------+ Subclavian               Not assessed                    +----------+--------+-------+------------+-------------------+ +---------+--------+--+--------+--+ VertebralPSV cm/s74EDV cm/s24 +---------+--------+--+--------+--+ Recent port placement, right subclavian artery not visualized due to bandages. Left Carotid Findings: +----------+--------+--------+--------+------------------+--------+           PSV cm/sEDV cm/sStenosisPlaque DescriptionComments +----------+--------+--------+--------+------------------+--------+ CCA Prox  141     24                                         +----------+--------+--------+--------+------------------+--------+ CCA Distal95      21                                         +----------+--------+--------+--------+------------------+--------+ ICA Prox  117      39                                         +----------+--------+--------+--------+------------------+--------+  ICA Mid   102     30                                         +----------+--------+--------+--------+------------------+--------+ ICA Distal95      38                                         +----------+--------+--------+--------+------------------+--------+ ECA       97      13                                         +----------+--------+--------+--------+------------------+--------+ +----------+--------+--------+--------+-------------------+           PSV cm/sEDV cm/sDescribeArm Pressure (mmHG) +----------+--------+--------+--------+-------------------+ [id number]                                         +----------+--------+--------+--------+-------------------+ +---------+--------+--+--------+--+ VertebralPSV cm/s71EDV cm/s28 +---------+--------+--+--------+--+   Summary: Right Carotid: The ECA appears <50% stenosed. The extracranial vessels were                near-normal with only minimal wall thickening or plaque. Left Carotid: Velocities in the left ICA are consistent with a 1-39% stenosis.               The ECA appears <50% stenosed. Vertebrals:  Bilateral vertebral arteries demonstrate antegrade flow. Subclavians: Right subclavian artery was not visualized. Normal flow              hemodynamics were seen in the left subclavian artery. *See table(s) above for measurements and observations.  Electronically signed by Debby Robertson on 06/17/2023 at 7:33:58 AM.    Final    ECHOCARDIOGRAM COMPLETE Result Date: 06/15/2023    ECHOCARDIOGRAM REPORT   Patient Name:   DRE GAMINO Date of Exam: 06/15/2023 Medical Rec #:  [id number]      Height:       66.0 in Accession #:    [id number]     Weight:       174.0 lb Date of Birth:  10-Feb-1976     BSA:          1.885 m Patient Age:    47 years       BP:           113/68 mmHg Patient Gender: M              HR:            70 bpm. Exam Location:  Inpatient Procedure: 2D Echo Indications:   syncope  History:       Patient has no prior history of Echocardiogram examinations.                Cancer.  Sonographer:   Tinnie Barefoot RDCS Referring      [phone] DAVID MANUEL ORTIZ Phys: IMPRESSIONS  1. Left ventricular ejection fraction, by estimation, is 60 to 65%. The left ventricle has normal function. The left ventricle has no regional wall motion abnormalities. Left ventricular diastolic parameters were normal.  2. Right ventricular systolic function is normal. The right ventricular size is normal.  3.  The mitral valve is normal in structure. No evidence of mitral valve regurgitation. No evidence of mitral stenosis.  4. The aortic valve is normal in structure. Aortic valve regurgitation is not visualized. No aortic stenosis is present.  5. The inferior vena cava is normal in size with greater than 50% respiratory variability, suggesting right atrial pressure of 3 mmHg. FINDINGS  Left Ventricle: Left ventricular ejection fraction, by estimation, is 60 to 65%. The left ventricle has normal function. The left ventricle has no regional wall motion abnormalities. The left ventricular internal cavity size was normal in size. There is  no left ventricular hypertrophy. Left ventricular diastolic parameters were normal. Right Ventricle: The right ventricular size is normal. No increase in right ventricular wall thickness. Right ventricular systolic function is normal. Left Atrium: Left atrial size was normal in size. Right Atrium: Right atrial size was normal in size. Pericardium: There is no evidence of pericardial effusion. Mitral Valve: The mitral valve is normal in structure. No evidence of mitral valve regurgitation. No evidence of mitral valve stenosis. Tricuspid Valve: The tricuspid valve is normal in structure. Tricuspid valve regurgitation is not demonstrated. No evidence of tricuspid stenosis. Aortic Valve: The aortic valve is  normal in structure. Aortic valve regurgitation is not visualized. No aortic stenosis is present. Pulmonic Valve: The pulmonic valve was normal in structure. Pulmonic valve regurgitation is not visualized. No evidence of pulmonic stenosis. Aorta: The aortic root is normal in size and structure. Venous: The inferior vena cava is normal in size with greater than 50% respiratory variability, suggesting right atrial pressure of 3 mmHg. IAS/Shunts: No atrial level shunt detected by color flow Doppler.  LEFT VENTRICLE PLAX 2D LVIDd:         4.70 cm   Diastology LVIDs:         2.60 cm   LV e' medial:    11.20 cm/s LV PW:         0.90 cm   LV E/e' medial:  5.7 LV IVS:        0.90 cm   LV e' lateral:   13.10 cm/s LVOT diam:     2.10 cm   LV E/e' lateral: 4.9 LV SV:         73 LV SV Index:   39 LVOT Area:     3.46 cm  RIGHT VENTRICLE             IVC RV Basal diam:  2.50 cm     IVC diam: 1.40 cm RV S prime:     17.60 cm/s TAPSE (M-mode): 2.7 cm LEFT ATRIUM             Index        RIGHT ATRIUM           Index LA diam:        3.60 cm 1.91 cm/m   RA Area:     16.20 cm LA Vol (A2C):   42.0 ml 22.28 ml/m  RA Volume:   41.10 ml  21.81 ml/m LA Vol (A4C):   43.5 ml 23.08 ml/m LA Biplane Vol: 44.5 ml 23.61 ml/m  AORTIC VALVE LVOT Vmax:   111.00 cm/s LVOT Vmean:  72.800 cm/s LVOT VTI:    0.210 m  AORTA Ao Root diam: 3.70 cm Ao Asc diam:  3.60 cm MITRAL VALVE MV Area (PHT): 4.39 cm    SHUNTS MV Decel Time: 173 msec    Systemic VTI:  0.21 m MV E velocity: 64.30  cm/s  Systemic Diam: 2.10 cm MV A velocity: 63.00 cm/s MV E/A ratio:  1.02 Mihai Croitoru MD Electronically signed by Jerel Balding MD Signature Date/Time: 06/15/2023/5:08:37 PM    Final    IR IMAGING GUIDED PORT INSERTION Result Date: 06/13/2023 INDICATION: Adenocarcinoma of the gastroesophageal junction. Port-A-Cath needed for treatment. EXAM: FLUOROSCOPIC AND ULTRASOUND GUIDED PLACEMENT OF A SUBCUTANEOUS PORT COMPARISON:  None Available. MEDICATIONS: Moderate  sedation ANESTHESIA/SEDATION: Moderate (conscious) sedation was employed during this procedure. A total of Versed  3 mg and fentanyl  150 mcg was administered intravenously at the order of the provider performing the procedure. Total intra-service moderate sedation time: 29 minutes. Patient's level of consciousness and vital signs were monitored continuously by radiology nurse throughout the procedure under the supervision of the provider performing the procedure. FLUOROSCOPY TIME:  Radiation Exposure Index (as provided by the fluoroscopic device): 1 mGy Kerma COMPLICATIONS: None immediate. PROCEDURE: The procedure, risks, benefits, and alternatives were explained to the patient. Questions regarding the procedure were encouraged and answered. The patient understands and consents to the procedure. Patient was placed supine on the interventional table. Ultrasound confirmed a patent right internal jugular vein. Ultrasound image was saved for documentation. The right chest and neck were cleaned with a skin antiseptic and a sterile drape was placed. Maximal barrier sterile technique was utilized including caps, mask, sterile gowns, sterile gloves, sterile drape, hand hygiene and skin antiseptic. The right neck was anesthetized with 1% lidocaine . Small incision was made in the right neck with a blade. Micropuncture set was placed in the right internal jugular vein with ultrasound guidance. The micropuncture wire was used for measurement purposes. The right chest was anesthetized with 1% lidocaine  with epinephrine . #15 blade was used to make an incision and a subcutaneous port pocket was formed. 8 french Power Port was assembled. Subcutaneous tunnel was formed with a stiff tunneling device. The port catheter was brought through the subcutaneous tunnel. The port was placed in the subcutaneous pocket. The micropuncture set was exchanged for a peel-away sheath. The catheter was placed through the peel-away sheath and the tip  was positioned at the superior cavoatrial junction. Catheter placement was confirmed with fluoroscopy. The port was accessed and flushed with heparinized saline. The port pocket was closed using two layers of absorbable sutures and Dermabond. The vein skin site was closed using a single layer of absorbable suture and Dermabond. Sterile dressings were applied. Patient tolerated the procedure well without an immediate complication. Ultrasound and fluoroscopic images were taken and saved for this procedure. IMPRESSION: Placement of a subcutaneous power-injectable port device. Catheter tip at the superior cavoatrial junction. Electronically Signed   By: Juliene Balder M.D.   On: 06/13/2023 13:39   NM PET Image Initial (PI) Skull Base To Thigh Result Date: 06/07/2023 CLINICAL DATA:  Initial treatment strategy for newly diagnosed adenocarcinoma at the GE junction. Possible hepatic metastatic disease. EXAM: NUCLEAR MEDICINE PET SKULL BASE TO THIGH TECHNIQUE: 9.5 mCi F-18 FDG was injected intravenously. Full-ring PET imaging was performed from the skull base to thigh after the radiotracer. CT data was obtained and used for attenuation correction and anatomic localization. Fasting blood glucose: 111 mg/dl COMPARISON:  CT of the chest, abdomen and pelvis 05/28/2023. FINDINGS: Mediastinal blood pool activity: SUV max 2.8 NECK: No hypermetabolic cervical lymph nodes are identified.Fairly symmetric activity within the lymphoid tissue of Waldeyer's ring is within physiologic limits. No suspicious activity identified within the pharyngeal mucosal space. Incidental CT findings: none CHEST: As seen on recent CT, there is  a large mass involving the distal esophagus with extension into the gastric cardia which is hypermetabolic. There are several hypermetabolic distal paraesophageal and right paratracheal lymph nodes, including a 1.0 cm left paraesophageal node on image 82/4 (SUV max 6.2) and a 1.0 cm right retrocrural node on image  92/4 (SUV max 8.1). No hypermetabolic upper mediastinal, hilar or axillary lymph nodes. Low-level hilar activity is within physiologic limits. No hypermetabolic pulmonary activity or suspicious nodularity. Incidental CT findings: Stable small calcified right upper lobe granuloma. ABDOMEN/PELVIS: As above, large hypermetabolic mass extending from the distal esophagus into the proximal stomach. This has an SUV max of 27.7 and the hypermetabolic activity spans an area measuring up to 8.1 x 8.1 cm transverse. There is direct extension of hypermetabolic activity into the porta hepatis, although no discrete hypermetabolic liver lesions are identified. There are multiple hypermetabolic lymph nodes in the gastrohepatic ligament with inferior extension and probable direct involvement of the pancreas. There is hypermetabolic activity throughout the pancreas, suspicious for tumor involvement. Additional hypermetabolic abdominal lymph nodes include a 1.9 cm node at the splenic hilum on image 92/4 (SUV max 12.1) and a lymph node or peritoneal implants within the omentum measuring 2.1 x 1.1 cm on image 105/4 (SUV max 15.6). There are hypermetabolic retroperitoneal lymph nodes extending inferior to the renal veins (SUV max 18.5). No hypermetabolic lymph nodes identified in the pelvis. There is no adrenal mass. Incidental CT findings: Mild iliac atherosclerosis.  No ascites. SKELETON: There is no hypermetabolic activity to suggest osseous metastatic disease. Incidental CT findings: none IMPRESSION: 1. Large hypermetabolic mass involving the distal esophagus and proximal stomach consistent with known primary esophageal carcinoma. 2. Multiple hypermetabolic retroperitoneal and lower mediastinal lymph nodes as described, consistent with locally advanced disease. Suspected direct tumor extension into the pancreatic body. Hypermetabolic node or peritoneal implant within the omentum. 3. No evidence of metastatic disease within the neck,  chest or pelvis. No osseous abnormalities. Electronically Signed   By: Elsie Perone M.D.   On: 06/07/2023 17:48

## 2023-07-05 DIAGNOSIS — G893 Neoplasm related pain (acute) (chronic): Secondary | ICD-10-CM

## 2023-07-05 DIAGNOSIS — D500 Iron deficiency anemia secondary to blood loss (chronic): Secondary | ICD-10-CM | POA: Diagnosis not present

## 2023-07-05 DIAGNOSIS — R627 Adult failure to thrive: Secondary | ICD-10-CM

## 2023-07-05 DIAGNOSIS — C160 Malignant neoplasm of cardia: Principal | ICD-10-CM

## 2023-07-05 LAB — RENAL FUNCTION PANEL
Albumin: 2.5 g/dL — ABNORMAL LOW (ref 3.5–5.0)
Anion gap: 7 (ref 5–15)
BUN: 14 mg/dL (ref 6–20)
CO2: 23 mmol/L (ref 22–32)
Calcium: 8.4 mg/dL — ABNORMAL LOW (ref 8.9–10.3)
Chloride: 104 mmol/L (ref 98–111)
Creatinine, Ser: 0.37 mg/dL — ABNORMAL LOW (ref 0.61–1.24)
GFR, Estimated: >60 mL/min (ref >60)
Glucose, Bld: 132 mg/dL — ABNORMAL HIGH (ref 70–99)
Phosphorus: 4.1 mg/dL (ref 2.5–4.6)
Potassium: 3.8 mmol/L (ref 3.5–5.1)
Sodium: 134 mmol/L — ABNORMAL LOW (ref 135–145)

## 2023-07-05 LAB — CBC WITH DIFFERENTIAL/PLATELET
Abs Immature Granulocytes: 0.09 10*3/uL — ABNORMAL HIGH (ref 0.00–0.07)
Basophils Absolute: 0 10*3/uL (ref 0.0–0.1)
Basophils Relative: 0 %
Eosinophils Absolute: 0.1 10*3/uL (ref 0.0–0.5)
Eosinophils Relative: 1 %
HCT: 27.5 % — ABNORMAL LOW (ref 39.0–52.0)
Hemoglobin: 8.7 g/dL — ABNORMAL LOW (ref 13.0–17.0)
Immature Granulocytes: 1 %
Lymphocytes Relative: 16 %
Lymphs Abs: 2 10*3/uL (ref 0.7–4.0)
MCH: 27.4 pg (ref 26.0–34.0)
MCHC: 31.6 g/dL (ref 30.0–36.0)
MCV: 86.5 fL (ref 80.0–100.0)
Monocytes Absolute: 1.1 10*3/uL — ABNORMAL HIGH (ref 0.1–1.0)
Monocytes Relative: 8 %
Neutro Abs: 9.5 10*3/uL — ABNORMAL HIGH (ref 1.7–7.7)
Neutrophils Relative %: 74 %
Platelets: 380 10*3/uL (ref 150–400)
RBC: 3.18 MIL/uL — ABNORMAL LOW (ref 4.22–5.81)
RDW: 17 % — ABNORMAL HIGH (ref 11.5–15.5)
WBC: 12.7 10*3/uL — ABNORMAL HIGH (ref 4.0–10.5)
nRBC: 0 % (ref 0.0–0.2)

## 2023-07-05 MED ORDER — OSMOLITE 1.5 CAL PO LIQD
1000.0000 mL | ORAL | Status: DC
  Administered 2023-07-05: 1000 mL
  Filled 2023-07-05: qty 1000

## 2023-07-05 NOTE — Progress Notes (Signed)
 [facility] PROGRESS NOTE   Jesus Haynes FMW:[id number] DOB: [birth year] DOA: 06/14/2023  PCP: [name], NP  Brief History: 48 year old male with PMH of adenocarcinoma of the GE junction with a fungating mass with dysphagia and pain, anemia from chronic blood loss presented to hospital with complaints of worsening pain and near syncope. 11/25 - 11/28 presents with dysphagia and abdominal pain was found to have adenocarcinoma of the GE junction.  Set up with hematology outpatient. 12/12 underwent Port-A-Cath placement. 12/13 due to dizziness while in the clinic was referred for direct admission.  Being treated for failure to thrive. 12/17 after failing conservative management with ongoing pain and difficulty swallowing underwent open placement of feeding jejunostomy tube with Dr. Leonor Dawn. 12/18 started on tube feeds Osmolite. 12/23 appears to have developed colonic ileus.  SBO protocol ordered.  EKG was consulted. 12/24 Hb dropped to 5.3.  Received 2 PRBC transfusion.  Recheck Hb 8.0 close to baseline. 12/25.  Patient requested to be off of tube feeding and has been refusing since as it was causing excessive fullness. 12/27 surgery signed off. 12/30.  Colonic ileus improving.  GI signed off. 1/1.  Reports abdominal distention after tube feeds, CT unremarkable for any worsening finding on ileus.  Cancer progression.  CT head was performed due to ongoing complaint of dizziness which is also negative.  Palliative care following.  Hematology will follow-up as well.  Prognosis is poor. 1/2.  Extensive discussion with patient at bedside with hematology.  Prognosis poor.  Not agreed for chemotherapy right now.  Initiating on tube feeds with plan to stop at nighttime.  Hospice was also recommended  Consultants: Medical oncology.  General surgery  Procedures: J-tube placement    Subjective/Interval History: Denies any abdominal pain.  Still complains of generalized  weakness.  Dizziness when he tries to get up.  He is unable to describe his symptoms of dizziness.    Assessment/Plan:  Adult failure to thrive. Moderate protein calorie malnutrition  Presents with dizziness and lightheadedness.  In the setting of poor p.o. intake from failure to thrive. Due to poor p.o. intake general surgery was consulted for consideration of feeding jejunostomy tube placement. After ensuring that the patient can receive tube feeding outpatient (due to lack of insurance) patient consented to undergo jejunostomy tube placement. Was started on tube feeding and as the patient developed ileus likely from narcotics due to refusal of bowel regimen patient has been refusing tube feeds since. Will not be a candidate for bolus feeding given his jejunostomy tube. TPN was considered but currently not initiated since his GI tract appears to be functioning well Patient has experienced some difficulty with tube feedings even though his GI tract appears to be functioning well.  CT scan of the abdomen was repeated and does not show any ileus.  Patient at times does not feel comfortable with the tube feedings and request that it be stopped.  Continue to monitor.  Will start back again this morning at 20 mL/h.  Dietitian has been following.     Colonic ileus. Seen by gastroenterology and general surgery.  Most recent CT scan did not show any new changes.  Abdomen is benign on examination.  General surgery has signed off. Patient is on intravenous metoclopramide .  Also on laxatives.     Cancer-related pain. Palliative care helping out with pain management. Patient is on methadone , dexamethasone , Dilaudid  as needed and Robaxin  injection twice daily. Continue current pain control. Robaxin  was switched over  to Valium  due to his complaints of dizziness.   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 Has been seen by oncology. Eventual plan is to initiate outpatient chemotherapy as well as  radiation. Currently patient needs to work on his nutritional status as well as functional status which appears to be very poor right now.   Near syncope. Presented with dizziness and lightheadedness as well as near syncope in the clinic while having blood draws. Telemetry evaluation unremarkable. Carotid Doppler normal. Echocardiogram EF 60-65%, no significant valvular abnormality. Initial presentation could be secondary to uncontrolled pain versus vasovagal event. His ongoing dizziness is likely secondary to pain medications. Continue TED stockings. Meclizine  initiated.  Also on Valium .   Anemia secondary to nutritional deficiency as well as chronic GI blood loss. Hemoglobin dropped down to 5.3 on 12/24 requiring blood transfusion. Iron  level remains low, 23.  B12 263 relatively low, folic acid  4.1 low. INR elevated 1.6. Patient started on multivitamin folic acid .  Given iron  infusions.  Started on B12 injections.  Hemoglobin has been stable for the last few days.  No overt bleeding identified. Avoiding pharmacological DVT prophylaxis.  Patient is aware that this places the patient at a high risk for poor outcome including the DVT.   Goals of care conversation. Patient currently wants to treat what is treatable. Wants to remain full code. Intermittently agreeable for medical intervention including tube feeds and then refuses. Currently does not appear to be a good candidate for chemotherapy due to his performance status. Previous rounding MD has discussed with patient along with medical oncology.  Prognosis thought to be poor.  Hospice was discussed.  No clear plans as yet for transition to hospice.  DVT Prophylaxis: SCDs Code Status: Full code Family Communication: Discussed with patient Disposition Plan: To be determined     Medications: Scheduled:  acetaminophen   650 mg Rectal Q6H   Or   acetaminophen  (TYLENOL ) oral liquid 160 mg/5 mL  650 mg Per Tube Q6H   Chlorhexidine   Gluconate Cloth  6 each Topical Daily   dexamethasone   4 mg Per Tube Daily   diazepam   2 mg Per Tube Q12H   feeding supplement  237 mL Oral TID BM   folic acid   1 mg Per Tube Daily   free water   100 mL Per Tube QID   methadone   5 mg Per Tube Q12H   metoCLOPramide  (REGLAN ) injection  10 mg Intravenous Q6H   multivitamin  15 mL Per Tube Daily   polyethylene glycol  17 g Per Tube Daily   potassium chloride   40 mEq Per Tube Daily   simethicone   40 mg Per Tube QID   Continuous:  feeding supplement (OSMOLITE 1.5 CAL)     PRN:albuterol , diazepam , docusate, HYDROmorphone  HCl **OR** HYDROmorphone  (DILAUDID ) injection, iohexol , meclizine , ondansetron  **OR** ondansetron  (ZOFRAN ) IV, prochlorperazine , sodium phosphate , sorbitol   Antibiotics: Anti-infectives (From admission, onward)    Start     Dose/Rate Route Frequency Ordered Stop   06/18/23 1500  ceFAZolin  (ANCEF ) IVPB 2g/100 mL premix        2 g 200 mL/hr over 30 Minutes Intravenous On call to O.R. 06/18/23 1446 06/18/23 1600       Objective:  Vital Signs  Vitals:   07/04/23 0636 07/04/23 1354 07/04/23 2122 07/05/23 0627  BP: 110/74 119/67 111/68 117/79  Pulse: 62 72 74 66  Resp: 16 20 14 14   Temp: 97.6 F (36.4 C) 97.7 F (36.5 C) 98 F (36.7 C) 98.1 F (36.7 C)  TempSrc: Oral Oral Oral Oral  SpO2: 96% 97% 99% 99%  Weight:    80.9 kg  Height:        Intake/Output Summary (Last 24 hours) at 07/05/2023 0918 Last data filed at 07/04/2023 1856 Gross per 24 hour  Intake 120 ml  Output 315 ml  Net -195 ml   Filed Weights   07/01/23 0404 07/04/23 0635 07/05/23 0627  Weight: 77.7 kg 80.9 kg 80.9 kg    General appearance: Awake alert.  In no distress Resp: Clear to auscultation bilaterally.  Normal effort Cardio: S1-S2 is normal regular.  No S3-S4.  No rubs murmurs or bruit GI: Abdomen is soft.  Nontender nondistended.  Bowel sounds are present normal.  No masses organomegaly.  J-tube is noted. Extremities: No edema.   Physical deconditioning is noted. Neurologic: Alert and oriented x3.  No focal neurological deficits.    Lab Results:  Data Reviewed: I have personally reviewed following labs and reports of the imaging studies  CBC: Recent Labs  Lab 07/01/23 0513 07/02/23 1115 07/03/23 0621 07/04/23 0605 07/05/23 0605  WBC 12.2* 12.5* 14.7* 14.1* 12.7*  NEUTROABS  --   --  10.9* 10.5* 9.5*  HGB 7.5* 6.8* 8.9* 9.1* 8.7*  HCT 24.5* 21.7* 28.8* 28.3* 27.5*  MCV 85.1 83.8 86.0 85.5 86.5  PLT 391 382 385 388 380    Basic Metabolic Panel: Recent Labs  Lab 07/01/23 0513 07/01/23 0514 07/02/23 0601 07/02/23 0602 07/02/23 1746 07/03/23 0621 07/03/23 0622 07/03/23 1720 07/04/23 0605 07/04/23 1830 07/05/23 0623  NA 133* 131*  --  128*  --   --  133*  --  130*  --  134*  K 3.5 3.5  --  3.4*  --   --  3.7  --  3.4*  --  3.8  CL 100 99  --  98  --   --  103  --  100  --  104  CO2 24 26  --  23  --   --  23  --  22  --  23  GLUCOSE 119* 116*  --  98  --   --  126*  --  118*  --  132*  BUN 22* 21*  --  16  --   --  17  --  14  --  14  CREATININE 0.55* 0.60*  --  0.48*  --   --  0.49*  --  0.51*  --  0.37*  CALCIUM  8.4* 8.3*  --  8.0*  --   --  8.0*  --  8.2*  --  8.4*  MG 2.3  --  2.2  --  2.2 2.1  --  2.0  --   --   --   PHOS 3.3 3.5 3.8 3.8 3.6 3.7 3.7 3.6 4.5 4.0 4.1    GFR: Estimated Creatinine Clearance: 114 mL/min (A) (by C-G formula based on SCr of 0.37 mg/dL (L)).  Liver Function Tests: Recent Labs  Lab 07/01/23 0513 07/01/23 0514 07/02/23 0602 07/03/23 0622 07/04/23 0605 07/05/23 0623  AST 44*  --   --   --   --   --   ALT 91*  --   --   --   --   --   ALKPHOS 55  --   --   --   --   --   BILITOT 0.6  --   --   --   --   --   PROT  5.9*  --   --   --   --   --   ALBUMIN 3.0* 2.8* 2.5* 2.4* 2.7* 2.5*    Coagulation Profile: Recent Labs  Lab 07/01/23 0513  INR 1.6*    CBG: Recent Labs  Lab 07/01/23 1223 07/01/23 1546 07/02/23 2134 07/03/23 0025 07/03/23 0404   GLUCAP 141* 134* 154* 115* 115*    Radiology Studies: CT ABDOMEN PELVIS W CONTRAST Result Date: 07/03/2023 CLINICAL DATA:  Bowel obstruction, colonic ileus on previous x-ray, history of adenocarcinoma at the gastroesophageal junction EXAM: CT ABDOMEN AND PELVIS WITH CONTRAST TECHNIQUE: Multidetector CT imaging of the abdomen and pelvis was performed using the standard protocol following bolus administration of intravenous contrast. RADIATION DOSE REDUCTION: This exam was performed according to the departmental dose-optimization program which includes automated exposure control, adjustment of the mA and/or kV according to patient size and/or use of iterative reconstruction technique. CONTRAST:  OMNIPAQUE  IOHEXOL  300 MG/ML  SOLN COMPARISON:  06/26/2023, 06/07/2023 FINDINGS: Lower chest: Trace bilateral pleural effusions with dependent lower lobe atelectasis. Infiltrative soft tissue mass involving the distal esophagus and proximal stomach is again noted. Posterior mediastinal adenopathy measuring up to 12 mm consistent with metastatic disease. Hepatobiliary: Stable hepatic cysts. Otherwise unremarkable appearance of the liver and gallbladder. Pancreas: The infiltrative mass at the gastroesophageal junction involves the superior margin of the pancreatic body, similar to prior exam. This area measures approximately 5.2 x 4.2 cm reference image 32/3. No pancreatic duct dilation. Spleen: Normal in size without focal abnormality. Adrenals/Urinary Tract: Adrenal glands are unremarkable. Kidneys are normal, without renal calculi, focal lesion, or hydronephrosis. Bladder is unremarkable. Stomach/Bowel: Infiltrative mass involving the distal esophagus, gastric cardia, and gastric fundus is again identified, consistent with known history of malignancy. There is increased surrounding mesenteric stranding, as well is progressive irregular mural thickening of the gastric fundus and body. There is a percutaneous  jejunostomy catheter in stable position. No evidence of small-bowel obstruction. Continued diffuse colonic distension with oral contrast and gas fluid levels throughout the colon, consistent with adynamic colonic ileus. Normal appendix right lower quadrant. Vascular/Lymphatic: Lymphadenopathy is seen along the stomach, gastric hepatic ligament, and porta hepatis. Index lymph nodes are as follows: Porta hepatis, image 31/3, 18 mm.  Previously 14 mm. Anterior mesenteric, image 29/3, 19 mm.  Previously 17 mm. Retroperitoneal, image 42/3, 15 mm.  Previously 15 mm. 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 Reproductive: Prostate is unremarkable. Other: Trace right upper quadrant ascites. No free intraperitoneal gas. No abdominal wall hernia. Musculoskeletal: No acute or destructive bony abnormalities. Reconstructed images demonstrate no additional findings. IMPRESSION: 1. Large infiltrative mass centered at the gastric cardia, involving the distal esophagus, gastric fundus/body, and superior margin of the pancreas. Overall increase in surrounding mesenteric stranding as well as irregular mural thickening of the stomach consistent with disease progression. 2. Metastatic lymphadenopathy within the retroperitoneum, upper abdomen, and posterior mediastinum, slightly progressive since prior exam. 3. Trace bilateral pleural effusions. 4. Diffuse colonic distension, with oral contrast throughout the colon as well as diffuse gas fluid levels, consistent with adynamic colonic ileus. No evidence of small-bowel obstruction. 5. Stable percutaneous jejunostomy tube. 6. Trace ascites. Electronically Signed   By: Ozell Daring M.D.   On: 07/03/2023 15:25   CT HEAD WO CONTRAST ( ) Result Date: 07/03/2023 CLINICAL DATA:  Syncope/presyncope, cerebrovascular cause suspected. EXAM: CT HEAD WITHOUT CONTRAST TECHNIQUE: Contiguous axial images were obtained from the base of the  skull through  the vertex without intravenous contrast. RADIATION DOSE REDUCTION: This exam was performed according to the departmental dose-optimization program which includes automated exposure control, adjustment of the mA and/or kV according to patient size and/or use of iterative reconstruction technique. COMPARISON:  None Available. FINDINGS: Brain: No acute intracranial hemorrhage. Gray-white differentiation is preserved. No hydrocephalus or extra-axial collection. No mass effect or midline shift. Vascular: No hyperdense vessel or unexpected calcification. Skull: No calvarial fracture or suspicious bone lesion. Skull base is unremarkable. Sinuses/Orbits: No acute finding. Other: None. IMPRESSION: No acute intracranial abnormality. Electronically Signed   By: Ryan Chess M.D.   On: 07/03/2023 15:10       LOS: 21 days   Rasul Decola [employer] on [email or web address]  07/05/2023, 9:18 AM

## 2023-07-05 NOTE — Plan of Care (Signed)

## 2023-07-05 NOTE — Progress Notes (Signed)
 Physical Therapy Treatment Patient Details Name: Jesus Haynes MRN: [id number] DOB: 1976-02-24 Today's Date: 07/05/2023   History of Present Illness Jesus Haynes is a 48 y.o. male presents with near syncopal episode while cancer center was drawing multiple vials of blood on 06/14/23. Pt s/p open placement of feeding jejunostomy tube 06/18/23.  PMH: asthma, adenocarcinoma gastroesophageal junction, cancer associated pain, history of GI bleed, melena, anemia due to chronic blood loss    PT Comments  Pt reports he's been less dizzy for the past couple days. He was able to transfer from bed to recliner with a RW with contact guard assist and minimal dizziness. Reviewed seated BUE/LE strengthening exercises to be performed independently for strengthening.    If plan is discharge home, recommend the following: A little help with walking and/or transfers;A little help with bathing/dressing/bathroom;Assistance with cooking/housework;Assist for transportation;Help with stairs or ramp for entrance   Can travel by private vehicle        Equipment Recommendations  Rolling walker (2 wheels);Wheelchair (measurements PT);Wheelchair cushion (measurements PT)    Recommendations for Other Services       Precautions / Restrictions Precautions Precautions: Fall;Other (comment) Precaution Comments: orthostatic Restrictions Weight Bearing Restrictions Per Provider Order: No     Mobility  Bed Mobility Overal bed mobility: Modified Independent Bed Mobility: Supine to Sit   Sidelying to sit: Modified independent (Device/Increase time), HOB elevated, Used rails            Transfers Overall transfer level: Needs assistance Equipment used: Rolling walker (2 wheels) Transfers: Sit to/from Stand, Bed to chair/wheelchair/BSC Sit to Stand: Contact guard assist   Step pivot transfers: Contact guard assist       General transfer comment: pt reported significant decrease in dizziness compared to a  couple days ago, stated he's taking medication for the dizziness; no physical assist needed. He felt too fatigued to attempt ambulation.    Ambulation/Gait                   Stairs             Wheelchair Mobility     Tilt Bed    Modified Rankin (Stroke Patients Only)       Balance Overall balance assessment: Needs assistance Sitting-balance support: No upper extremity supported Sitting balance-Leahy Scale: Good     Standing balance support: Bilateral upper extremity supported Standing balance-Leahy Scale: Poor                              Cognition Arousal: Alert Behavior During Therapy: WFL for tasks assessed/performed Overall Cognitive Status: Within Functional Limits for tasks assessed                                          Exercises      General Comments        Pertinent Vitals/Pain Pain Assessment Faces Pain Scale: Hurts a little bit Pain Location: abdomen Pain Descriptors / Indicators: Constant Pain Intervention(s): Limited activity within patient's tolerance, Monitored during session, Premedicated before session    Home Living                          Prior Function            PT Goals (current goals can now be  found in the care plan section) Acute Rehab PT Goals Patient Stated Goal: regain strength and independence PT Goal Formulation: With patient Time For Goal Achievement: 07/05/23 Potential to Achieve Goals: Fair Progress towards PT goals: Progressing toward goals    Frequency    Min 1X/week      PT Plan      Co-evaluation              AM-PAC PT 6 Clicks Mobility   Outcome Measure  Help needed turning from your back to your side while in a flat bed without using bedrails?: None Help needed moving from lying on your back to sitting on the side of a flat bed without using bedrails?: A Little Help needed moving to and from a bed to a chair (including a wheelchair)?:  A Little Help needed standing up from a chair using your arms (e.g., wheelchair or bedside chair)?: A Little Help needed to walk in hospital room?: A Lot Help needed climbing 3-5 steps with a railing? : Total 6 Click Score: 16    End of Session Equipment Utilized During Treatment: Gait belt Activity Tolerance: Patient tolerated treatment well Patient left: in chair;with call bell/phone within reach;with chair alarm set Nurse Communication: Mobility status PT Visit Diagnosis: Muscle weakness (generalized) (M62.81);Pain;Unsteadiness on feet (R26.81)     Time: 8796-8784 PT Time Calculation (min) (ACUTE ONLY): 12 min  Charges:    $Therapeutic Activity: 8-22 mins PT General Charges $$ ACUTE PT VISIT: 1 Visit                     Sylvan Delon Copp PT 07/05/2023  Acute Rehabilitation Services  Office [phone]

## 2023-07-05 NOTE — Progress Notes (Signed)
   07/05/23 1000  Spiritual Encounters  Type of Visit Follow up  Care provided to: Pt not available  Referral source Chaplain assessment;Clinical staff  Reason for visit Advance directives  OnCall Visit No   Chaplain atttempted visit this monring - however note on door stated NO VISITORS and Chaplain checked with RN.  Will continue to make progress on spiritual assessment, etc. And provide services.    Respectfully Submitted,  Rev. Mallie Schlossman Pattie Flaharty

## 2023-07-06 DIAGNOSIS — D500 Iron deficiency anemia secondary to blood loss (chronic): Secondary | ICD-10-CM | POA: Diagnosis not present

## 2023-07-06 DIAGNOSIS — C160 Malignant neoplasm of cardia: Secondary | ICD-10-CM | POA: Diagnosis not present

## 2023-07-06 DIAGNOSIS — G893 Neoplasm related pain (acute) (chronic): Secondary | ICD-10-CM | POA: Diagnosis not present

## 2023-07-06 DIAGNOSIS — R627 Adult failure to thrive: Secondary | ICD-10-CM | POA: Diagnosis not present

## 2023-07-06 LAB — RENAL FUNCTION PANEL
Albumin: 2.5 g/dL — ABNORMAL LOW (ref 3.5–5.0)
Anion gap: 7 (ref 5–15)
BUN: 19 mg/dL (ref 6–20)
CO2: 25 mmol/L (ref 22–32)
Calcium: 8.2 mg/dL — ABNORMAL LOW (ref 8.9–10.3)
Chloride: 101 mmol/L (ref 98–111)
Creatinine, Ser: 0.51 mg/dL — ABNORMAL LOW (ref 0.61–1.24)
GFR, Estimated: >60 mL/min (ref >60)
Glucose, Bld: 139 mg/dL — ABNORMAL HIGH (ref 70–99)
Phosphorus: 4 mg/dL (ref 2.5–4.6)
Potassium: 4 mmol/L (ref 3.5–5.1)
Sodium: 133 mmol/L — ABNORMAL LOW (ref 135–145)

## 2023-07-06 LAB — PHOSPHORUS
Phosphorus: 3.9 mg/dL (ref 2.5–4.6)
Phosphorus: 3.5 mg/dL (ref 2.5–4.6)

## 2023-07-06 MED ORDER — VITAMIN B-12 1000 MCG PO TABS
1000.0000 ug | ORAL_TABLET | Freq: Every day | ORAL | Status: DC
Start: 2023-07-06 — End: 2023-07-09
  Administered 2023-07-06 – 2023-07-08 (×3): 1000 ug via JEJUNOSTOMY
  Filled 2023-07-06 (×3): qty 1

## 2023-07-06 MED ORDER — OSMOLITE 1.5 CAL PO LIQD
1000.0000 mL | ORAL | Status: AC
Start: 2023-07-06 — End: 2023-07-06
  Administered 2023-07-06: 1000 mL
  Filled 2023-07-06: qty 1000

## 2023-07-06 NOTE — Plan of Care (Signed)
 Patient requested his tube feeding be paused at 0530. He stated he would resume later.

## 2023-07-06 NOTE — Plan of Care (Signed)
   Problem: Education: Goal: Knowledge of General Education information will improve Description Including pain rating scale, medication(s)/side effects and non-pharmacologic comfort measures Outcome: Progressing

## 2023-07-06 NOTE — Progress Notes (Signed)
 [facility] PROGRESS NOTE   Jesus Haynes FMW:[id number] DOB: 1975-12-20 DOA: 06/14/2023  PCP: [name], NP  Brief History: 48 year old male with PMH of adenocarcinoma of the GE junction with a fungating mass with dysphagia and pain, anemia from chronic blood loss presented to hospital with complaints of worsening pain and near syncope. 11/25 - 11/28 presents with dysphagia and abdominal pain was found to have adenocarcinoma of the GE junction.  Set up with hematology outpatient. 12/12 underwent Port-A-Cath placement. 12/13 due to dizziness while in the clinic was referred for direct admission.  Being treated for failure to thrive. 12/17 after failing conservative management with ongoing pain and difficulty swallowing underwent open placement of feeding jejunostomy tube with Dr. Leonor Dawn. 12/18 started on tube feeds Osmolite. 12/23 appears to have developed colonic ileus.  SBO protocol ordered.  EKG was consulted. 12/24 Hb dropped to 5.3.  Received 2 PRBC transfusion.  Recheck Hb 8.0 close to baseline. 12/25.  Patient requested to be off of tube feeding and has been refusing since as it was causing excessive fullness. 12/27 surgery signed off. 12/30.  Colonic ileus improving.  GI signed off. 1/1.  Reports abdominal distention after tube feeds, CT unremarkable for any worsening finding on ileus.  Cancer progression.  CT head was performed due to ongoing complaint of dizziness which is also negative.  Palliative care following.  Hematology will follow-up as well.  Prognosis is poor. 1/2.  Extensive discussion with patient at bedside with hematology.  Prognosis poor.  Not agreed for chemotherapy right now.  Initiating on tube feeds with plan to stop at nighttime.  Hospice was also recommended  Consultants: Medical oncology.  General surgery  Procedures: J-tube placement    Subjective/Interval History: Patient mentions that he does feel a little better over the last  48 hours.  Dizziness is still there but better than before.  Denies any significant pain issues.  No nausea vomiting.  Felt uncomfortable this morning with the tube feeding so it was turned off.     Assessment/Plan:  Adult failure to thrive. Moderate protein calorie malnutrition  Presents with dizziness and lightheadedness.  In the setting of poor p.o. intake from failure to thrive. Due to poor p.o. intake general surgery was consulted for consideration of feeding jejunostomy tube placement. After ensuring that the patient can receive tube feeding outpatient (due to lack of insurance) patient consented to undergo jejunostomy tube placement. Was started on tube feeding and as the patient developed ileus likely from narcotics due to refusal of bowel regimen patient has been refusing tube feeds since. Will not be a candidate for bolus feeding given his jejunostomy tube. TPN was considered but currently not initiated since his GI tract appears to be functioning well Patient has experienced some difficulty with tube feedings even though his GI tract appears to be functioning well.  CT scan of the abdomen was repeated and does not show any ileus.   Tolerated tube feedings at 20 mL/h for most of the day and night but had to turn it off at about 530 this morning.  Will resume it at noon at a higher rate and continue to about midnight and see how he does.  Dietitian has been following but has not seen the patient in several days.  Will request dietitian to see on Monday.     Colonic ileus. Seen by gastroenterology and general surgery.  Most recent CT scan did not show any new changes.  Abdomen is benign  on examination.  General surgery has signed off. Patient is on intravenous metoclopramide .  Also on laxatives.     Cancer-related pain. Palliative care helping out with pain management. Patient is on methadone , dexamethasone , Dilaudid  as needed and Robaxin  injection twice daily. Continue current pain  control. Robaxin  was switched over to Valium  due to his complaints of dizziness.   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 Has been seen by oncology. Eventual plan is to initiate outpatient chemotherapy as well as radiation. Currently patient needs to work on his nutritional status as well as functional status which appears to be very poor right now.   Near syncope. Presented with dizziness and lightheadedness as well as near syncope in the clinic while having blood draws. Telemetry evaluation unremarkable. Carotid Doppler normal. Echocardiogram EF 60-65%, no significant valvular abnormality. Initial presentation could be secondary to uncontrolled pain versus vasovagal event. His ongoing dizziness is likely secondary to pain medications. Continue TED stockings. Meclizine  initiated.  Also on Valium .   Anemia secondary to nutritional deficiency as well as chronic GI blood loss. Hemoglobin dropped down to 5.3 on 12/24 requiring blood transfusion. Iron  level remains low, 23.  B12 263 relatively low, folic acid  4.1 low. INR elevated 1.6. Patient started on multivitamin folic acid .  Given iron  infusions. Patient was also given cyanocobalamin  injections but no longer noted to be on same.  Will initiate cyanocobalamin  mean through J-tube.   Hemoglobin has been stable.  No overt bleeding. Avoiding pharmacological DVT prophylaxis.  Patient is aware that this places the patient at a high risk for poor outcome including the DVT.   Goals of care conversation. Patient currently wants to treat what is treatable. Wants to remain full code. Intermittently agreeable for medical intervention including tube feeds and then refuses. Currently does not appear to be a good candidate for chemotherapy due to his performance status. Previous rounding MD has discussed with patient along with medical oncology.  Prognosis thought to be poor.  In his current state he is not a candidate for any treatments per  oncology note from 1/2.  Hospice was discussed with the patient.  Patient still thinking about it.  He states that he plans to go stay with his daughter when he feels better.   DVT Prophylaxis: SCDs Code Status: Full code Family Communication: Discussed with patient Disposition Plan: Home health was recommended.  Did not ambulate with physical therapy on 1/3.     Medications: Scheduled:  acetaminophen   650 mg Rectal Q6H   Or   acetaminophen  (TYLENOL ) oral liquid 160 mg/5 mL  650 mg Per Tube Q6H   Chlorhexidine  Gluconate Cloth  6 each Topical Daily   dexamethasone   4 mg Per Tube Daily   diazepam   2 mg Per Tube Q12H   feeding supplement  237 mL Oral TID BM   folic acid   1 mg Per Tube Daily   free water   100 mL Per Tube QID   methadone   5 mg Per Tube Q12H   metoCLOPramide  (REGLAN ) injection  10 mg Intravenous Q6H   multivitamin  15 mL Per Tube Daily   polyethylene glycol  17 g Per Tube Daily   potassium chloride   40 mEq Per Tube Daily   simethicone   40 mg Per Tube QID   Continuous:  feeding supplement (OSMOLITE 1.5 CAL) Stopped (07/06/23 0530)   PRN:albuterol , diazepam , docusate, HYDROmorphone  HCl **OR** HYDROmorphone  (DILAUDID ) injection, iohexol , meclizine , ondansetron  **OR** ondansetron  (ZOFRAN ) IV, prochlorperazine , sodium phosphate , sorbitol   Antibiotics: Anti-infectives (From admission,  onward)    Start     Dose/Rate Route Frequency Ordered Stop   06/18/23 1500  ceFAZolin  (ANCEF ) IVPB 2g/100 mL premix        2 g 200 mL/hr over 30 Minutes Intravenous On call to O.R. 06/18/23 1446 06/18/23 1600       Objective:  Vital Signs  Vitals:   07/05/23 1334 07/05/23 2046 07/06/23 0500 07/06/23 0549  BP: 102/63 128/85  106/63  Pulse: 69 76  69  Resp:  18  18  Temp: 97.9 F (36.6 C) 98 F (36.7 C)  98.5 F (36.9 C)  TempSrc: Oral Oral  Oral  SpO2: 94% 99%  98%  Weight:   79.3 kg   Height:        Intake/Output Summary (Last 24 hours) at 07/06/2023 0911 Last data  filed at 07/05/2023 2100 Gross per 24 hour  Intake --  Output 350 ml  Net -350 ml   Filed Weights   07/04/23 0635 07/05/23 0627 07/06/23 0500  Weight: 80.9 kg 80.9 kg 79.3 kg    General appearance: Awake alert.  In no distress Resp: Clear to auscultation bilaterally.  Normal effort Cardio: S1-S2 is normal regular.  No S3-S4.  No rubs murmurs or bruit GI: Abdomen is soft.  Nontender nondistended.  Bowel sounds are present normal.  No masses organomegaly.  J-tube is noted. No obvious focal neurological deficits.  Physical deconditioning is noted.   Lab Results:  Data Reviewed: I have personally reviewed following labs and reports of the imaging studies  CBC: Recent Labs  Lab 07/01/23 0513 07/02/23 1115 07/03/23 0621 07/04/23 0605 07/05/23 0605  WBC 12.2* 12.5* 14.7* 14.1* 12.7*  NEUTROABS  --   --  10.9* 10.5* 9.5*  HGB 7.5* 6.8* 8.9* 9.1* 8.7*  HCT 24.5* 21.7* 28.8* 28.3* 27.5*  MCV 85.1 83.8 86.0 85.5 86.5  PLT 391 382 385 388 380    Basic Metabolic Panel: Recent Labs  Lab 07/01/23 0513 07/01/23 0514 07/02/23 0601 07/02/23 0602 07/02/23 1746 07/03/23 0621 07/03/23 0622 07/03/23 1720 07/04/23 0605 07/04/23 1830 07/05/23 0623 07/06/23 0531  NA 133* 131*  --  128*  --   --  133*  --  130*  --  134*  --   K 3.5 3.5  --  3.4*  --   --  3.7  --  3.4*  --  3.8  --   CL 100 99  --  98  --   --  103  --  100  --  104  --   CO2 24 26  --  23  --   --  23  --  22  --  23  --   GLUCOSE 119* 116*  --  98  --   --  126*  --  118*  --  132*  --   BUN 22* 21*  --  16  --   --  17  --  14  --  14  --   CREATININE 0.55* 0.60*  --  0.48*  --   --  0.49*  --  0.51*  --  0.37*  --   CALCIUM  8.4* 8.3*  --  8.0*  --   --  8.0*  --  8.2*  --  8.4*  --   MG 2.3  --  2.2  --  2.2 2.1  --  2.0  --   --   --   --   PHOS 3.3 3.5  3.8 3.8 3.6 3.7 3.7 3.6 4.5 4.0 4.1 3.9    GFR: Estimated Creatinine Clearance: 113 mL/min (A) (by C-G formula based on SCr of 0.37 mg/dL (L)).  Liver  Function Tests: Recent Labs  Lab 07/01/23 0513 07/01/23 0514 07/02/23 0602 07/03/23 0622 07/04/23 0605 07/05/23 0623  AST 44*  --   --   --   --   --   ALT 91*  --   --   --   --   --   ALKPHOS 55  --   --   --   --   --   BILITOT 0.6  --   --   --   --   --   PROT 5.9*  --   --   --   --   --   ALBUMIN 3.0* 2.8* 2.5* 2.4* 2.7* 2.5*    Coagulation Profile: Recent Labs  Lab 07/01/23 0513  INR 1.6*    CBG: Recent Labs  Lab 07/01/23 1223 07/01/23 1546 07/02/23 2134 07/03/23 0025 07/03/23 0404  GLUCAP 141* 134* 154* 115* 115*    Radiology Studies: No results found.      LOS: 22 days   Enisa Runyan [employer] on [email or web address]  07/06/2023, 9:11 AM

## 2023-07-06 NOTE — Progress Notes (Signed)
  Daily Progress Note   Patient Name: Jesus Haynes       Date: 07/06/2023 DOB: [birth year]  Age: 48 y.o. MRN#: [id number] Attending Physician: Verdene Purchase, MD Primary Care Physician: [name], NP Admit Date: 06/14/2023 Length of Stay: 22 days  PMT has been following along peripherally with patient's medical journey. As per EMR review, on 1/2 oncologist discussed with patient that with his current functional status at ECOG 3, this would be a contraindication to systemic chemotherapy. It was suggested to patient (with his mother-in-law present who has been greatly involved in his medical care) that he strongly considering enrolling in hospice care. Patient has been contemplating this. Patient still not tolerating continuous tube feeds.   Agree that patient is appropriate to receive hospice care at this time if he will agree to it.   PMT will continue to follow along peripherally. Please reach out if acute PMT needs arise. Thank you.    Tinnie Radar, DO Palliative Care Provider PMT # [phone]

## 2023-07-07 DIAGNOSIS — C160 Malignant neoplasm of cardia: Secondary | ICD-10-CM | POA: Diagnosis not present

## 2023-07-07 DIAGNOSIS — G893 Neoplasm related pain (acute) (chronic): Secondary | ICD-10-CM | POA: Diagnosis not present

## 2023-07-07 DIAGNOSIS — R627 Adult failure to thrive: Secondary | ICD-10-CM | POA: Diagnosis not present

## 2023-07-07 DIAGNOSIS — D500 Iron deficiency anemia secondary to blood loss (chronic): Secondary | ICD-10-CM | POA: Diagnosis not present

## 2023-07-07 LAB — CBC
HCT: 24 % — ABNORMAL LOW (ref 39.0–52.0)
HCT: 23.9 % — ABNORMAL LOW (ref 39.0–52.0)
Hemoglobin: 7.5 g/dL — ABNORMAL LOW (ref 13.0–17.0)
Hemoglobin: 7.4 g/dL — ABNORMAL LOW (ref 13.0–17.0)
MCH: 27.2 pg (ref 26.0–34.0)
MCH: 26.8 pg (ref 26.0–34.0)
MCHC: 31.3 g/dL (ref 30.0–36.0)
MCHC: 31 g/dL (ref 30.0–36.0)
MCV: 87 fL (ref 80.0–100.0)
MCV: 86.6 fL (ref 80.0–100.0)
Platelets: 323 10*3/uL (ref 150–400)
Platelets: 309 10*3/uL (ref 150–400)
RBC: 2.76 MIL/uL — ABNORMAL LOW (ref 4.22–5.81)
RBC: 2.76 MIL/uL — ABNORMAL LOW (ref 4.22–5.81)
RDW: 17.2 % — ABNORMAL HIGH (ref 11.5–15.5)
RDW: 17.2 % — ABNORMAL HIGH (ref 11.5–15.5)
WBC: 10.7 10*3/uL — ABNORMAL HIGH (ref 4.0–10.5)
WBC: 12.4 10*3/uL — ABNORMAL HIGH (ref 4.0–10.5)
nRBC: 0 % (ref 0.0–0.2)
nRBC: 0 % (ref 0.0–0.2)

## 2023-07-07 LAB — BASIC METABOLIC PANEL
Anion gap: 6 (ref 5–15)
BUN: 16 mg/dL (ref 6–20)
CO2: 26 mmol/L (ref 22–32)
Calcium: 8.3 mg/dL — ABNORMAL LOW (ref 8.9–10.3)
Chloride: 102 mmol/L (ref 98–111)
Creatinine, Ser: 0.47 mg/dL — ABNORMAL LOW (ref 0.61–1.24)
GFR, Estimated: >60 mL/min (ref >60)
Glucose, Bld: 146 mg/dL — ABNORMAL HIGH (ref 70–99)
Potassium: 3.8 mmol/L (ref 3.5–5.1)
Sodium: 134 mmol/L — ABNORMAL LOW (ref 135–145)

## 2023-07-07 LAB — MAGNESIUM: Magnesium: 2.1 mg/dL (ref 1.7–2.4)

## 2023-07-07 MED ORDER — PANTOPRAZOLE SODIUM 40 MG IV SOLR
40.0000 mg | Freq: Two times a day (BID) | INTRAVENOUS | Status: DC
  Administered 2023-07-07 – 2023-07-11 (×9): 40 mg via INTRAVENOUS
  Filled 2023-07-07 (×9): qty 10

## 2023-07-07 MED ORDER — OSMOLITE 1.5 CAL PO LIQD
1000.0000 mL | ORAL | Status: AC
  Administered 2023-07-07: 1000 mL
  Filled 2023-07-07: qty 1000

## 2023-07-07 NOTE — Progress Notes (Addendum)
 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 11/25 - 11/28 presents with dysphagia and abdominal pain was found to have adenocarcinoma of the GE junction.  Set up with hematology outpatient. 12/12 underwent Port-A-Cath placement. 12/13 due to dizziness while in the clinic was referred for direct admission.  Being treated for failure to thrive. 12/17 after failing conservative management with ongoing pain and difficulty swallowing underwent open placement of feeding jejunostomy tube with Dr. Leonor Dawn. 12/18 started on tube feeds Osmolite. 12/23 appears to have developed colonic ileus.  SBO protocol ordered.  EKG was consulted. 12/24 Hb dropped to 5.3.  Received 2 PRBC transfusion.  Recheck Hb 8.0 close to baseline. 12/25.  Patient requested to be off of tube feeding and has been refusing since as it was causing excessive fullness. 12/27 surgery signed off. 12/30.  Colonic ileus improving.  GI signed off. 1/1.  Reports abdominal distention after tube feeds, CT unremarkable for any worsening finding on ileus.  Cancer progression.  CT head was performed due to ongoing complaint of dizziness which is also negative.  Palliative care following.  Hematology will follow-up as well.  Prognosis is poor. 1/2.  Extensive discussion with patient at bedside with hematology.  Prognosis poor.  Not agreed for chemotherapy right now.  Initiating on tube feeds with plan to stop at nighttime.  Hospice was also recommended  Consultants: Medical oncology.  General surgery  Procedures: J-tube placement    Subjective/Interval History: Patient tolerated his tube feedings up until early this morning.   Dizziness is getting better.  Overall he feels that things are looking a little better in the last 48 hours.     Assessment/Plan:  Adult failure to thrive. Moderate protein calorie malnutrition  Presents with dizziness and lightheadedness.  In the setting of poor p.o. intake from failure to thrive. Due to poor p.o. intake general surgery was consulted for consideration of feeding jejunostomy tube placement. After ensuring that the patient can receive tube feeding outpatient (due to lack of insurance) patient consented to undergo jejunostomy tube placement. Was started on tube feeding and as the patient developed ileus likely from narcotics due to refusal of bowel regimen patient has been refusing tube feeds since.  Will not be a candidate for bolus feeding given his jejunostomy tube. TPN was considered but currently not initiated since his GI tract appears to be functioning well Patient has experienced some difficulty with tube feedings. CT scan of the abdomen was repeated and does not show any ileus.   Tube feedings were reinitiated and given over 12 hours the last couple days.  Seems to be tolerating it better.  Will increase the rate to 50 mL/h today starting at noon and ending at midnight.  If he does okay with this we can increase the rate further tomorrow after discussions with dietitian. Continue with free water  flushes.   Colonic ileus. Seen by gastroenterology and general surgery.  Most recent CT scan did not show any new changes.  Abdomen is benign on examination.   General surgery has signed off. Patient is on intravenous metoclopramide .  Also on laxatives.     Cancer-related pain. Palliative care helping out with pain management. Patient is on methadone ,  dexamethasone , Dilaudid  as needed and Robaxin  injection twice daily. Continue current pain control. Robaxin  was switched over to Valium  due to his complaints of dizziness.   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 Has  been seen by oncology. Currently patient needs to work on his nutritional status as well as functional status which appears to be very poor right now. When last seen by oncology a few days ago they recommended hospice to the patient.   Near syncope. Presented with dizziness and lightheadedness as well as near syncope in the clinic while having blood draws. Telemetry evaluation unremarkable. Carotid Doppler normal. Echocardiogram EF 60-65%, no significant valvular abnormality. Initial presentation could be secondary to uncontrolled pain versus vasovagal event. His ongoing dizziness is likely secondary to pain medications. Continue TED stockings. Meclizine  initiated.  Also on Valium .   Anemia secondary to nutritional deficiency as well as chronic GI blood loss. Hemoglobin dropped down to 5.3 on 12/24 requiring blood transfusion. Iron  level remains low, 23.  B12 263 relatively low, folic acid  4.1 low. INR elevated 1.6. Patient started on multivitamin folic acid .  Given iron  infusions. Patient was also given cyanocobalamin  injections and now on enteral cyanocobalamin .   Drop in hemoglobin noted today to 7.5.  No overt bleeding reported.  Discussed with nursing staff who will monitor for bleeding.  Recheck labs tomorrow.   Goals of care conversation. Patient currently wants to treat what is treatable. Wants to remain full code. Intermittently agreeable for medical intervention including tube feeds and then refuses. Currently does not appear to be a good candidate for chemotherapy due to his performance status. Previous rounding MD has discussed with patient along with medical oncology.  Prognosis thought to be poor.  In his current state he is not a candidate for any treatments per oncology note from 1/2.  Hospice was discussed with the patient.  Patient still thinking about it.  He states that he plans to go stay with his daughter when he feels better.   DVT Prophylaxis: SCDs Code Status: Full  code Family Communication: Discussed with patient Disposition Plan: Home health was recommended.  Did not ambulate with physical therapy on 1/3.     Medications: Scheduled:  acetaminophen   650 mg Rectal Q6H   Or   acetaminophen  (TYLENOL ) oral liquid 160 mg/5 mL  650 mg Per Tube Q6H   Chlorhexidine  Gluconate Cloth  6 each Topical Daily   vitamin B-12  1,000 mcg Per J Tube Daily   dexamethasone   4 mg Per Tube Daily   diazepam   2 mg Per Tube Q12H   feeding supplement  237 mL Oral TID BM   folic acid   1 mg Per Tube Daily   free water   100 mL Per Tube QID   methadone   5 mg Per Tube Q12H   metoCLOPramide  (REGLAN ) injection  10 mg Intravenous Q6H   multivitamin  15 mL Per Tube Daily   polyethylene glycol  17 g Per Tube Daily   potassium chloride   40 mEq Per Tube Daily   simethicone   40 mg Per Tube QID   Continuous:  feeding supplement (OSMOLITE 1.5 CAL)     PRN:albuterol , diazepam , docusate, HYDROmorphone  HCl **OR** HYDROmorphone  (DILAUDID ) injection, iohexol , meclizine , ondansetron  **OR** ondansetron  (ZOFRAN ) IV, prochlorperazine , sodium phosphate , sorbitol   Antibiotics: Anti-infectives (From admission, onward)    Start     Dose/Rate Route Frequency Ordered Stop   06/18/23 1500  ceFAZolin  (ANCEF ) IVPB 2g/100 mL premix        2 g 200  mL/hr over 30 Minutes Intravenous On call to O.R. 06/18/23 1446 06/18/23 1600       Objective:  Vital Signs  Vitals:   07/06/23 0549 07/06/23 1313 07/06/23 2103 07/07/23 0538  BP: 106/63 102/60 120/73 111/80  Pulse: 69 77 68 70  Resp: 18 16 18 18   Temp: 98.5 F (36.9 C) 98 F (36.7 C) 98.3 F (36.8 C) 97.9 F (36.6 C)  TempSrc: Oral Oral Oral Oral  SpO2: 98% 95% 98% 98%  Weight:      Height:       No intake or output data in the 24 hours ending 07/07/23 1034  Filed Weights   07/04/23 0635 07/05/23 0627 07/06/23 0500  Weight: 80.9 kg 80.9 kg 79.3 kg    General appearance: Awake alert.  In no distress Resp: Clear to auscultation  bilaterally.  Normal effort Cardio: S1-S2 is normal regular.  No S3-S4.  No rubs murmurs or bruit GI: Abdomen is soft.  Nontender nondistended.  Bowel sounds are present normal.  No masses organomegaly   Lab Results:  Data Reviewed: I have personally reviewed following labs and reports of the imaging studies  CBC: Recent Labs  Lab 07/02/23 1115 07/03/23 0621 07/04/23 0605 07/05/23 0605 07/07/23 0610  WBC 12.5* 14.7* 14.1* 12.7* 10.7*  NEUTROABS  --  10.9* 10.5* 9.5*  --   HGB 6.8* 8.9* 9.1* 8.7* 7.5*  HCT 21.7* 28.8* 28.3* 27.5* 24.0*  MCV 83.8 86.0 85.5 86.5 87.0  PLT 382 385 388 380 323    Basic Metabolic Panel: Recent Labs  Lab 07/02/23 0601 07/02/23 0602 07/02/23 1746 07/03/23 0621 07/03/23 0622 07/03/23 1720 07/04/23 0605 07/04/23 1830 07/05/23 0623 07/06/23 0531 07/06/23 1740 07/07/23 0610  NA  --    < >  --   --  133*  --  130*  --  134* 133*  --  134*  K  --    < >  --   --  3.7  --  3.4*  --  3.8 4.0  --  3.8  CL  --    < >  --   --  103  --  100  --  104 101  --  102  CO2  --    < >  --   --  23  --  22  --  23 25  --  26  GLUCOSE  --    < >  --   --  126*  --  118*  --  132* 139*  --  146*  BUN  --    < >  --   --  17  --  14  --  14 19  --  16  CREATININE  --    < >  --   --  0.49*  --  0.51*  --  0.37* 0.51*  --  0.47*  CALCIUM   --    < >  --   --  8.0*  --  8.2*  --  8.4* 8.2*  --  8.3*  MG 2.2  --  2.2 2.1  --  2.0  --   --   --   --   --  2.1  PHOS 3.8   < > 3.6 3.7 3.7 3.6 4.5 4.0 4.1 4.0  3.9 3.5  --    < > = values in this interval not displayed.    GFR: Estimated Creatinine Clearance: 113 mL/min (A) (by C-G formula based  on SCr of 0.47 mg/dL (L)).  Liver Function Tests: Recent Labs  Lab 07/01/23 0513 07/01/23 0514 07/02/23 0602 07/03/23 0622 07/04/23 0605 07/05/23 0623 07/06/23 0531  AST 44*  --   --   --   --   --   --   ALT 91*  --   --   --   --   --   --   ALKPHOS 55  --   --   --   --   --   --   BILITOT 0.6  --   --   --    --   --   --   PROT 5.9*  --   --   --   --   --   --   ALBUMIN 3.0*   < > 2.5* 2.4* 2.7* 2.5* 2.5*   < > = values in this interval not displayed.    Coagulation Profile: Recent Labs  Lab 07/01/23 0513  INR 1.6*    CBG: Recent Labs  Lab 07/01/23 1223 07/01/23 1546 07/02/23 2134 07/03/23 0025 07/03/23 0404  GLUCAP 141* 134* 154* 115* 115*    Radiology Studies: No results found.      LOS: 23 days   Mindy Behnken [employer] on [email or web address]  07/07/2023, 10:34 AM

## 2023-07-07 NOTE — Progress Notes (Signed)
 Pts tube feeding ran all night at 40cc/hr until 0600, when he requested it be unhooked for a few hours. Mick Sell RN

## 2023-07-07 NOTE — Plan of Care (Signed)
   Problem: Education: Goal: Knowledge of General Education information will improve Description Including pain rating scale, medication(s)/side effects and non-pharmacologic comfort measures Outcome: Progressing

## 2023-07-08 DIAGNOSIS — D500 Iron deficiency anemia secondary to blood loss (chronic): Secondary | ICD-10-CM | POA: Diagnosis not present

## 2023-07-08 DIAGNOSIS — R627 Adult failure to thrive: Secondary | ICD-10-CM | POA: Diagnosis not present

## 2023-07-08 DIAGNOSIS — G893 Neoplasm related pain (acute) (chronic): Secondary | ICD-10-CM | POA: Diagnosis not present

## 2023-07-08 DIAGNOSIS — C160 Malignant neoplasm of cardia: Secondary | ICD-10-CM | POA: Diagnosis not present

## 2023-07-08 LAB — HEMOGLOBIN AND HEMATOCRIT, BLOOD
HCT: 31 % — ABNORMAL LOW (ref 39.0–52.0)
Hemoglobin: 9.7 g/dL — ABNORMAL LOW (ref 13.0–17.0)

## 2023-07-08 LAB — CBC
HCT: 23.7 % — ABNORMAL LOW (ref 39.0–52.0)
Hemoglobin: 7.2 g/dL — ABNORMAL LOW (ref 13.0–17.0)
MCH: 26.7 pg (ref 26.0–34.0)
MCHC: 30.4 g/dL (ref 30.0–36.0)
MCV: 87.8 fL (ref 80.0–100.0)
Platelets: 319 10*3/uL (ref 150–400)
RBC: 2.7 MIL/uL — ABNORMAL LOW (ref 4.22–5.81)
RDW: 17.1 % — ABNORMAL HIGH (ref 11.5–15.5)
WBC: 11 10*3/uL — ABNORMAL HIGH (ref 4.0–10.5)
nRBC: 0 % (ref 0.0–0.2)

## 2023-07-08 LAB — PREPARE RBC (CROSSMATCH)

## 2023-07-08 MED ORDER — OSMOLITE 1.5 CAL PO LIQD
1080.0000 mL | ORAL | Status: DC
  Administered 2023-07-08 – 2023-07-10 (×3): 1080 mL
  Administered 2023-07-10: 300 mL
  Filled 2023-07-08 (×4): qty 1185

## 2023-07-08 MED ORDER — PROSOURCE TF20 ENFIT COMPATIBL EN LIQD
60.0000 mL | Freq: Two times a day (BID) | ENTERAL | Status: DC
  Administered 2023-07-08 – 2023-07-11 (×7): 60 mL
  Filled 2023-07-08 (×8): qty 60

## 2023-07-08 MED ORDER — SODIUM CHLORIDE 0.9% IV SOLUTION: Freq: Once | INTRAVENOUS | Status: AC

## 2023-07-08 MED ORDER — BOOST / RESOURCE BREEZE PO LIQD CUSTOM
1.0000 | Freq: Three times a day (TID) | ORAL | Status: DC
  Administered 2023-07-10 – 2023-07-11 (×2): 1 via ORAL

## 2023-07-08 MED ORDER — METOCLOPRAMIDE HCL 10 MG PO TABS
10.0000 mg | ORAL_TABLET | Freq: Three times a day (TID) | ORAL | Status: DC
  Administered 2023-07-08 (×3): 10 mg via JEJUNOSTOMY
  Filled 2023-07-08 (×3): qty 1

## 2023-07-08 NOTE — Progress Notes (Addendum)
 Nutrition Follow-up  DOCUMENTATION CODES:   Non-severe (moderate) malnutrition in context of chronic illness  INTERVENTION:   -Continue Osmolite 1.5 @ 60 ml/hr x 18 hours via J-tube(1200-0600). -60 ml Prosource TF BID -This provides 1780 kcals (80% of needs), 107g protein (100% of needs) and 822 ml H2O -Free water  flushes: 100 ml QID (400 ml) -Pt will need to consume PO to meet additional kcal and fluids needs.  -Boost Breeze po TID, each supplement provides 250 kcal and 9 grams of protein -Magic cup BID with meals, each supplement provides 290 kcal and 9 grams of protein   NUTRITION DIAGNOSIS:   Moderate Malnutrition related to chronic illness, cancer and cancer related treatments as evidenced by energy intake < or equal to 75% for > or equal to 1 month, mild fat depletion, percent weight loss.  Ongoing.  GOAL:   Patient will meet greater than or equal to 90% of their needs  Progressing.  MONITOR:   Labs, Weight trends, I & O's, PO intake, Supplement acceptance, TF tolerance  ASSESSMENT:   48 y.o. male with medical history significant of asthma, adenocarcinoma of the gastroesophageal junction, cancer associated pain, history of GI bleed, anemia due to chronic blood loss was sent from the cancer center after he had a near syncopal episode while they were drawing multiple vials of blood.  Patient in room, no visitors at bedside. Pt reports feeling okay today. Has been tolerating the gradual increase in his tube feeding every night. Has tube feeding start around noon and then asks for tube feeds to be stopped every morning around 5 or 6am. States he feels better with a break from the tube feeds instead of running them 24 hours. Will continue 18 hour feeds and increase to 60 ml/hr today. Per MD okay for RD to order. Pt reports he has been drinking Gatorade and lots of juice. Not a fan of Ensure but willing to try Boost Breeze. Wants to have solid foods. States he has snacked on  some foods and did well with them. Would not state what he ate but requests some eggs. Messaged MD about diet advancement. Per MD wants to wait another day given concern for GI bleed.  Admission weight: 177 lbs Current weight: 174 lbs  Medications: Vitamin B-12, Folic acid , Reglan , MVI, Miralax , KLOR-CON   Labs reviewed: Low Na   Diet Order:   Diet Order             Diet full liquid Fluid consistency: Thin  Diet effective now                   EDUCATION NEEDS:   Education needs have been addressed  Skin:  Skin Assessment: Skin Integrity Issues: Skin Integrity Issues:: Incisions Incisions: 12/17 abdomen  Last BM:  1/5  Height:   Ht Readings from Last 1 Encounters:  06/26/23 5' 6 (1.676 m)    Weight:   Wt Readings from Last 1 Encounters:  07/06/23 79.3 kg    BMI:  Body mass index is 28.22 kg/m.  Estimated Nutritional Needs:   Kcal:  2200-2400  Protein:  105-115g  Fluid:  2.2L/day   Morna Lee, MS, RD, LDN Inpatient Clinical Dietitian Contact via Secure chat

## 2023-07-08 NOTE — Progress Notes (Signed)
 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 11/25 - 11/28 presents with dysphagia and abdominal pain was found to have adenocarcinoma of the GE junction.  Set up with hematology outpatient. 12/12 underwent Port-A-Cath placement. 12/13 due to dizziness while in the clinic was referred for direct admission.  Being treated for failure to thrive. 12/17 after failing conservative management with ongoing pain and difficulty swallowing underwent open placement of feeding jejunostomy tube with Dr. Leonor Dawn. 12/18 started on tube feeds Osmolite. 12/23 appears to have developed colonic ileus.  SBO protocol ordered.  EKG was consulted. 12/24 Hb dropped to 5.3.  Received 2 PRBC transfusion.  Recheck Hb 8.0 close to baseline. 12/25.  Patient requested to be off of tube feeding and has been refusing since as it was causing excessive fullness. 12/27 surgery signed off. 12/30.  Colonic ileus improving.  GI signed off. 1/1.  Reports abdominal distention after tube feeds, CT unremarkable for any worsening finding on ileus.  Cancer progression.  CT head was performed due to ongoing complaint of dizziness which is also negative.  Palliative care following.  Hematology will follow-up as well.  Prognosis is poor.   Consultants: Medical oncology.  General surgery  Procedures: J-tube placement    Subjective/Interval History: Patient mentions that overall he feels better in terms of his dizziness.  Able to walk to the bathroom without difficulty.  Tolerating his feeds as well.  Understands that he remains at risk of bleeding from his tumor.     Assessment/Plan:  Adult failure to  thrive. Moderate protein calorie malnutrition  Presents with dizziness and lightheadedness.  In the setting of poor p.o. intake from failure to thrive. Due to poor p.o. intake general surgery was consulted for consideration of feeding jejunostomy tube placement. After ensuring that the patient can receive tube feeding outpatient (due to lack of insurance) patient consented to undergo jejunostomy tube placement. Was started on tube feeding and as the patient developed ileus likely from narcotics due to refusal of bowel regimen patient has been refusing tube feeds since.  Will not be a candidate for bolus feeding given his jejunostomy tube. TPN was considered but currently not initiated since his GI tract appears to be functioning well Patient has experienced some difficulty with tube feedings. CT scan of the abdomen was repeated and does not show any ileus.   Seems to be tolerating his tube feedings better.  Continue 12 hourly tube feeds and will increase the rate to 60 mL/h today.  Dietitian to also see the patient today.  Continue with free water  flushes.   Colonic ileus. Seen by gastroenterology and general surgery.  Most recent CT scan did not show any new changes.  Abdomen is benign on examination.   General surgery has signed off. Patient is on intravenous metoclopramide .  Also on laxatives.   Change metoclopramide  to parenteral route.   Cancer-related pain. Palliative care helping out with pain management. Patient is on methadone , dexamethasone , Dilaudid  as needed and Robaxin  injection twice daily. Continue current pain control. Robaxin  was switched over to Valium  due to his complaints of dizziness.   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 Has been seen by oncology. Currently patient needs to work  on his nutritional status as well as functional status which appears to be very poor right now. When last seen by oncology a few days ago they recommended hospice to the patient.    Near syncope. Presented with dizziness and lightheadedness as well as near syncope in the clinic while having blood draws. Telemetry evaluation unremarkable. Carotid Doppler normal. Echocardiogram EF 60-65%, no significant valvular abnormality. Initial presentation could be secondary to uncontrolled pain versus vasovagal event. His ongoing dizziness is likely secondary to pain medications. Continue TED stockings. Meclizine  initiated.  Also on Valium .   Anemia secondary to nutritional deficiency as well as chronic GI blood loss. Hemoglobin dropped down to 5.3 on 12/24 requiring blood transfusion. Iron  level remains low, 23.  B12 263 relatively low, folic acid  4.1 low. INR elevated 1.6. Patient started on multivitamin folic acid .  Given iron  infusions. Patient was also given cyanocobalamin  injections and now on enteral cyanocobalamin .   According to nursing staff patient had black-colored stool yesterday.  Drop in hemoglobin noted.  Will transfuse 2 units of PRBC as he is symptomatic.   Goals of care conversation. Patient currently wants to treat what is treatable. Wants to remain full code. Intermittently agreeable for medical intervention including tube feeds and then refuses. Currently does not appear to be a good candidate for chemotherapy due to his performance status. Previous rounding MD has discussed with patient along with medical oncology.  Prognosis thought to be poor.  In his current state he is not a candidate for any treatments per oncology note from 1/2.  Hospice was discussed with the patient.  Patient still thinking about it.  He states that he plans to go stay with his daughter when he feels better.   DVT Prophylaxis: SCDs Code Status: Full code Family Communication: Discussed with patient Disposition Plan: Home health was recommended.  PT and OT to continue to see.  Hopefully patient has improved in the last several days since he reports improvement in his dizziness.  If  hemoglobin remains stable anticipate discharge in the next 2 to 3 days.     Medications: Scheduled:  sodium chloride    Intravenous Once   acetaminophen   650 mg Rectal Q6H   Or   acetaminophen  (TYLENOL ) oral liquid 160 mg/5 mL  650 mg Per Tube Q6H   Chlorhexidine  Gluconate Cloth  6 each Topical Daily   vitamin B-12  1,000 mcg Per J Tube Daily   diazepam   2 mg Per Tube Q12H   feeding supplement  237 mL Oral TID BM   folic acid   1 mg Per Tube Daily   free water   100 mL Per Tube QID   methadone   5 mg Per Tube Q12H   metoCLOPramide  (REGLAN ) injection  10 mg Intravenous Q6H   multivitamin  15 mL Per Tube Daily   pantoprazole  (PROTONIX ) IV  40 mg Intravenous Q12H   polyethylene glycol  17 g Per Tube Daily   potassium chloride   40 mEq Per Tube Daily   simethicone   40 mg Per Tube QID   Continuous:   PRN:albuterol , diazepam , docusate, HYDROmorphone  HCl **OR** HYDROmorphone  (DILAUDID ) injection, iohexol , meclizine , ondansetron  **OR** ondansetron  (ZOFRAN ) IV, prochlorperazine , sodium phosphate , sorbitol   Antibiotics: Anti-infectives (From admission, onward)    Start     Dose/Rate Route Frequency Ordered Stop   06/18/23 1500  ceFAZolin  (ANCEF ) IVPB 2g/100 mL premix        2 g 200 mL/hr over 30 Minutes Intravenous On call to O.R. 06/18/23 1446 06/18/23 1600  Objective:  Vital Signs  Vitals:   07/07/23 0538 07/07/23 1307 07/07/23 2009 07/08/23 0547  BP: 111/80 103/63 110/77 115/76  Pulse: 70 72 85 67  Resp: 18 16 18 18   Temp: 97.9 F (36.6 C) 98 F (36.7 C) 98.3 F (36.8 C) 98 F (36.7 C)  TempSrc: Oral Oral Oral Oral  SpO2: 98% 96% 98% 99%  Weight:      Height:       No intake or output data in the 24 hours ending 07/08/23 0943  Filed Weights   07/04/23 0635 07/05/23 0627 07/06/23 0500  Weight: 80.9 kg 80.9 kg 79.3 kg    General appearance: Awake alert.  In no distress Resp: Clear to auscultation bilaterally.  Normal effort Cardio: S1-S2 is normal regular.   No S3-S4.  No rubs murmurs or bruit GI: Abdomen is soft.  Nontender nondistended.  Bowel sounds are present normal.  No masses organomegaly Extremities: Able to move extremities.  Lab Results:  Data Reviewed: I have personally reviewed following labs and reports of the imaging studies  CBC: Recent Labs  Lab 07/03/23 0621 07/04/23 0605 07/05/23 0605 07/07/23 0610 07/07/23 1311 07/08/23 0558  WBC 14.7* 14.1* 12.7* 10.7* 12.4* 11.0*  NEUTROABS 10.9* 10.5* 9.5*  --   --   --   HGB 8.9* 9.1* 8.7* 7.5* 7.4* 7.2*  HCT 28.8* 28.3* 27.5* 24.0* 23.9* 23.7*  MCV 86.0 85.5 86.5 87.0 86.6 87.8  PLT 385 388 380 323 309 319    Basic Metabolic Panel: Recent Labs  Lab 07/02/23 0601 07/02/23 0602 07/02/23 1746 07/03/23 0621 07/03/23 0622 07/03/23 1720 07/04/23 0605 07/04/23 1830 07/05/23 0623 07/06/23 0531 07/06/23 1740 07/07/23 0610  NA  --    < >  --   --  133*  --  130*  --  134* 133*  --  134*  K  --    < >  --   --  3.7  --  3.4*  --  3.8 4.0  --  3.8  CL  --    < >  --   --  103  --  100  --  104 101  --  102  CO2  --    < >  --   --  23  --  22  --  23 25  --  26  GLUCOSE  --    < >  --   --  126*  --  118*  --  132* 139*  --  146*  BUN  --    < >  --   --  17  --  14  --  14 19  --  16  CREATININE  --    < >  --   --  0.49*  --  0.51*  --  0.37* 0.51*  --  0.47*  CALCIUM   --    < >  --   --  8.0*  --  8.2*  --  8.4* 8.2*  --  8.3*  MG 2.2  --  2.2 2.1  --  2.0  --   --   --   --   --  2.1  PHOS 3.8   < > 3.6 3.7 3.7 3.6 4.5 4.0 4.1 4.0  3.9 3.5  --    < > = values in this interval not displayed.    GFR: Estimated Creatinine Clearance: 113 mL/min (A) (by C-G formula based on SCr of 0.47 mg/dL (  L)).  Liver Function Tests: Recent Labs  Lab 07/02/23 0602 07/03/23 0622 07/04/23 0605 07/05/23 0623 07/06/23 0531  ALBUMIN 2.5* 2.4* 2.7* 2.5* 2.5*    CBG: Recent Labs  Lab 07/01/23 1223 07/01/23 1546 07/02/23 2134 07/03/23 0025 07/03/23 0404  GLUCAP 141* 134*  154* 115* 115*    Radiology Studies: No results found.      LOS: 24 days   Debi Cousin [employer] on [email or web address]  07/08/2023, 9:43 AM

## 2023-07-08 NOTE — Progress Notes (Signed)
 Tube feeding stopped per patient request.

## 2023-07-08 NOTE — Plan of Care (Signed)

## 2023-07-09 DIAGNOSIS — C160 Malignant neoplasm of cardia: Secondary | ICD-10-CM | POA: Diagnosis not present

## 2023-07-09 DIAGNOSIS — R627 Adult failure to thrive: Secondary | ICD-10-CM | POA: Diagnosis not present

## 2023-07-09 DIAGNOSIS — D500 Iron deficiency anemia secondary to blood loss (chronic): Secondary | ICD-10-CM | POA: Diagnosis not present

## 2023-07-09 DIAGNOSIS — G893 Neoplasm related pain (acute) (chronic): Secondary | ICD-10-CM | POA: Diagnosis not present

## 2023-07-09 LAB — BPAM RBC
Blood Product Expiration Date: 202502022359
Blood Product Expiration Date: 202502022359
ISSUE DATE / TIME: 202501061301
ISSUE DATE / TIME: 202501061541
Unit Type and Rh: 5100
Unit Type and Rh: 5100

## 2023-07-09 LAB — TYPE AND SCREEN
ABO/RH(D): O POS
Antibody Screen: NEGATIVE
Unit division: 0
Unit division: 0

## 2023-07-09 LAB — CBC
HCT: 30.7 % — ABNORMAL LOW (ref 39.0–52.0)
Hemoglobin: 9.6 g/dL — ABNORMAL LOW (ref 13.0–17.0)
MCH: 26.5 pg (ref 26.0–34.0)
MCHC: 31.3 g/dL (ref 30.0–36.0)
MCV: 84.8 fL (ref 80.0–100.0)
Platelets: 308 10*3/uL (ref 150–400)
RBC: 3.62 MIL/uL — ABNORMAL LOW (ref 4.22–5.81)
RDW: 18 % — ABNORMAL HIGH (ref 11.5–15.5)
WBC: 10.8 10*3/uL — ABNORMAL HIGH (ref 4.0–10.5)
nRBC: 0.2 % (ref 0.0–0.2)

## 2023-07-09 LAB — COMPREHENSIVE METABOLIC PANEL
ALT: 50 U/L — ABNORMAL HIGH (ref 0–44)
AST: 24 U/L (ref 15–41)
Albumin: 2.8 g/dL — ABNORMAL LOW (ref 3.5–5.0)
Alkaline Phosphatase: 69 U/L (ref 38–126)
Anion gap: 5 (ref 5–15)
BUN: 18 mg/dL (ref 6–20)
CO2: 26 mmol/L (ref 22–32)
Calcium: 8.2 mg/dL — ABNORMAL LOW (ref 8.9–10.3)
Chloride: 101 mmol/L (ref 98–111)
Creatinine, Ser: 0.54 mg/dL — ABNORMAL LOW (ref 0.61–1.24)
GFR, Estimated: >60 mL/min (ref >60)
Glucose, Bld: 125 mg/dL — ABNORMAL HIGH (ref 70–99)
Potassium: 3.9 mmol/L (ref 3.5–5.1)
Sodium: 132 mmol/L — ABNORMAL LOW (ref 135–145)
Total Bilirubin: 0.5 mg/dL (ref 0.0–1.2)
Total Protein: 5.7 g/dL — ABNORMAL LOW (ref 6.5–8.1)

## 2023-07-09 MED ORDER — DIAZEPAM 1 MG/ML PO SOLN
2.0000 mg | Freq: Two times a day (BID) | ORAL | Status: DC
  Administered 2023-07-09 – 2023-07-11 (×5): 2 mg via ORAL
  Filled 2023-07-09 (×5): qty 5

## 2023-07-09 MED ORDER — ONDANSETRON HCL 4 MG PO TABS: 4.0000 mg | ORAL_TABLET | Freq: Four times a day (QID) | ORAL | Status: DC | PRN

## 2023-07-09 MED ORDER — POLYETHYLENE GLYCOL 3350 17 G PO PACK
17.0000 g | PACK | Freq: Every day | ORAL | Status: DC
  Filled 2023-07-09 (×2): qty 1

## 2023-07-09 MED ORDER — METHADONE HCL 10 MG/ML PO CONC
5.0000 mg | Freq: Two times a day (BID) | ORAL | Status: DC
  Administered 2023-07-09 – 2023-07-11 (×5): 5 mg via ORAL
  Filled 2023-07-09 (×6): qty 5

## 2023-07-09 MED ORDER — DOCUSATE SODIUM 50 MG/5ML PO LIQD: 100.0000 mg | Freq: Two times a day (BID) | ORAL | Status: DC | PRN

## 2023-07-09 MED ORDER — ACETAMINOPHEN 160 MG/5ML PO SOLN
650.0000 mg | Freq: Four times a day (QID) | ORAL | Status: DC
  Administered 2023-07-09 – 2023-07-11 (×9): 650 mg via ORAL
  Filled 2023-07-09 (×9): qty 20.3

## 2023-07-09 MED ORDER — DIAZEPAM 1 MG/ML PO SOLN: 5.0000 mg | Freq: Four times a day (QID) | ORAL | Status: DC | PRN

## 2023-07-09 MED ORDER — POTASSIUM CHLORIDE 20 MEQ PO PACK
40.0000 meq | PACK | Freq: Every day | ORAL | Status: DC
  Administered 2023-07-09 – 2023-07-11 (×3): 40 meq via ORAL
  Filled 2023-07-09 (×3): qty 2

## 2023-07-09 MED ORDER — SIMETHICONE 40 MG/0.6ML PO SUSP
40.0000 mg | Freq: Four times a day (QID) | ORAL | Status: DC
  Administered 2023-07-09 – 2023-07-11 (×10): 40 mg via ORAL
  Filled 2023-07-09 (×12): qty 0.6

## 2023-07-09 MED ORDER — METOCLOPRAMIDE HCL 10 MG PO TABS
10.0000 mg | ORAL_TABLET | Freq: Three times a day (TID) | ORAL | Status: DC
  Administered 2023-07-09 – 2023-07-11 (×11): 10 mg via ORAL
  Filled 2023-07-09 (×11): qty 1

## 2023-07-09 MED ORDER — ADULT MULTIVITAMIN LIQUID CH
15.0000 mL | Freq: Every day | ORAL | Status: DC
  Administered 2023-07-09 – 2023-07-11 (×3): 15 mL via ORAL
  Filled 2023-07-09 (×3): qty 15

## 2023-07-09 MED ORDER — SORBITOL 70 % SOLN: 30.0000 mL | Freq: Every day | Status: DC | PRN

## 2023-07-09 MED ORDER — ONDANSETRON HCL 4 MG/2ML IJ SOLN: 4.0000 mg | Freq: Four times a day (QID) | INTRAMUSCULAR | Status: DC | PRN

## 2023-07-09 MED ORDER — ACETAMINOPHEN 650 MG RE SUPP
650.0000 mg | Freq: Four times a day (QID) | RECTAL | Status: DC
  Filled 2023-07-09: qty 1

## 2023-07-09 MED ORDER — VITAMIN B-12 1000 MCG PO TABS
1000.0000 ug | ORAL_TABLET | Freq: Every day | ORAL | Status: DC
  Administered 2023-07-09 – 2023-07-11 (×3): 1000 ug via ORAL
  Filled 2023-07-09 (×5): qty 1

## 2023-07-09 MED ORDER — FOLIC ACID 1 MG PO TABS
1.0000 mg | ORAL_TABLET | Freq: Every day | ORAL | Status: DC
  Administered 2023-07-09 – 2023-07-11 (×3): 1 mg via ORAL
  Filled 2023-07-09 (×3): qty 1

## 2023-07-09 NOTE — Plan of Care (Signed)

## 2023-07-09 NOTE — Progress Notes (Signed)
 Physical Therapy Treatment Patient Details Name: Jesus Haynes MRN: [id number] DOB: August 05, 1975 Today's Date: 07/09/2023   History of Present Illness Jesus Haynes is a 48 y.o. male presents with near syncopal episode while cancer center was drawing multiple vials of blood on 06/14/23. Pt s/p open placement of feeding jejunostomy tube 06/18/23.  PMH: asthma, adenocarcinoma gastroesophageal junction, cancer associated pain, history of GI bleed, melena, anemia due to chronic blood loss    PT Comments  Pt reports he has been ambulating to the restroom without assistance, denies difficulty, denies dizziness. Pt comes to sitting EOB without assistance, prefers to pull from RW to power up. Pt amb 44 ft with RW, slow and steady cadence, minimal bil foot clearance, chair follow for safety, pt needing seated rest break due to fatigue then able to amb back to room. Pt tolerates remaining up in recliner at EOS, all needs in reach; nursing notified of beeping tube feed machine and NT on way for routine vitals check.    If plan is discharge home, recommend the following: A little help with walking and/or transfers;A little help with bathing/dressing/bathroom;Assistance with cooking/housework;Assist for transportation;Help with stairs or ramp for entrance   Can travel by private vehicle        Equipment Recommendations       Recommendations for Other Services       Precautions / Restrictions Precautions Precautions: Fall Restrictions Weight Bearing Restrictions Per Provider Order: No     Mobility  Bed Mobility Overal bed mobility: Modified Independent             General bed mobility comments: supine to sit modified ind, using bedrails as needed with HOB elevated, no physical assist or verbal cues    Transfers Overall transfer level: Needs assistance Equipment used: Rolling walker (2 wheels) Transfers: Sit to/from Stand Sit to Stand: Supervision           General transfer  comment: verbal cues for hand placement to push from seated surface as pt prefers to use RW to assist in powering up, denies dizziness    Ambulation/Gait Ambulation/Gait assistance: Contact guard assist Gait Distance (Feet): 44 Feet (x2) Assistive device: Rolling walker (2 wheels) Gait Pattern/deviations: Step-through pattern, Decreased stride length Gait velocity: decreased     General Gait Details: slow gait with minimal bil foot clearance, denies dizziness, chair follow with seated rest break at halfway point, no LE buckling or overt LOB, denies dizziness throughout ambulation   Stairs             Wheelchair Mobility     Tilt Bed    Modified Rankin (Stroke Patients Only)       Balance Overall balance assessment: Needs assistance Sitting-balance support: No upper extremity supported Sitting balance-Leahy Scale: Good     Standing balance support: Bilateral upper extremity supported, During functional activity, Reliant on assistive device for balance Standing balance-Leahy Scale: Poor                              Cognition Arousal: Alert Behavior During Therapy: WFL for tasks assessed/performed Overall Cognitive Status: Within Functional Limits for tasks assessed                                          Exercises      General Comments General comments (skin integrity,  edema, etc.): BP 115/67 and HR 81 during seated rest break of ambulation, denies dizziness      Pertinent Vitals/Pain Pain Assessment Pain Assessment: Faces Faces Pain Scale: Hurts a little bit Pain Location: generalized Pain Descriptors / Indicators: Constant Pain Intervention(s): Limited activity within patient's tolerance, Monitored during session, Premedicated before session    Home Living                          Prior Function            PT Goals (current goals can now be found in the care plan section) Acute Rehab PT Goals Patient  Stated Goal: regain strength and independence PT Goal Formulation: With patient Time For Goal Achievement: 07/19/23 Potential to Achieve Goals: Fair    Frequency    Min 1X/week      PT Plan      Co-evaluation              AM-PAC PT 6 Clicks Mobility   Outcome Measure  Help needed turning from your back to your side while in a flat bed without using bedrails?: None Help needed moving from lying on your back to sitting on the side of a flat bed without using bedrails?: None Help needed moving to and from a bed to a chair (including a wheelchair)?: A Little Help needed standing up from a chair using your arms (e.g., wheelchair or bedside chair)?: A Little Help needed to walk in hospital room?: A Little Help needed climbing 3-5 steps with a railing? : Total 6 Click Score: 18    End of Session Equipment Utilized During Treatment: Gait belt Activity Tolerance: Patient tolerated treatment well Patient left: in chair;with call bell/phone within reach (NT on way for vital check) Nurse Communication: Mobility status;Other (comment) (tube feed machine beeping, up in recliner and ready for vitals) PT Visit Diagnosis: Muscle weakness (generalized) (M62.81);Pain;Unsteadiness on feet (R26.81)     Time: 8867-8848 PT Time Calculation (min) (ACUTE ONLY): 19 min  Charges:    $Gait Training: 8-22 mins PT General Charges $$ ACUTE PT VISIT: 1 Visit                     Tori Zaidyn Claire PT, DPT 07/09/23, 2:11 PM

## 2023-07-09 NOTE — Progress Notes (Signed)
 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 11/25 - 11/28 presents with dysphagia and abdominal pain was found to have adenocarcinoma of the GE junction.  Set up with hematology outpatient. 12/12 underwent Port-A-Cath placement. 12/13 due to dizziness while in the clinic was referred for direct admission.  Being treated for failure to thrive. 12/17 after failing conservative management with ongoing pain and difficulty swallowing underwent open placement of feeding jejunostomy tube with Dr. Leonor Dawn. 12/18 started on tube feeds Osmolite. 12/23 appears to have developed colonic ileus.  SBO protocol ordered.  EKG was consulted. 12/24 Hb dropped to 5.3.  Received 2 PRBC transfusion.  Recheck Hb 8.0 close to baseline. 12/25.  Patient requested to be off of tube feeding and has been refusing since as it was causing excessive fullness. 12/27 surgery signed off. 12/30.  Colonic ileus improving.  GI signed off. 1/1.  Reports abdominal distention after tube feeds, CT unremarkable for any worsening finding on ileus.  Cancer progression.  CT head was performed due to ongoing complaint of dizziness which is also negative.  Palliative care following.  Hematology will follow-up as well.  Prognosis is poor.   Consultants: Medical oncology.  General surgery  Procedures: J-tube placement    Subjective/Interval History: Patient mentions that overall he feels much better.  Has been able to ambulate to the bathroom and back without getting dizzy or lightheaded. has been tolerating his tube feedings better as well.  Still having dark stools at times.  Understands that he remains at risk of  bleeding from his tumor.     Assessment/Plan:  Adult failure to thrive. Moderate protein calorie malnutrition  Presented with dizziness and lightheadedness.  In the setting of poor p.o. intake from failure to thrive. Due to poor p.o. intake general surgery was consulted for consideration of feeding jejunostomy tube placement. After ensuring that the patient can receive tube feeding outpatient (due to lack of insurance) patient consented to undergo jejunostomy tube placement. Was started on tube feeding and as the patient developed ileus likely from narcotics due to refusal of bowel regimen patient has been refusing tube feeds since.  Will not be a candidate for bolus feeding given his jejunostomy tube. TPN was considered but currently not initiated since his GI tract appears to be functioning well Patient experienced some difficulty with tube feedings. CT scan of the abdomen was repeated and does not show any ileus.   Patient's tolerance of tube feedings has improved.  Dietitian is following.  If he continues to do well over the next 24 to 48 hours then he should be ready to go home with tube feedings on 1/9.   Colonic ileus. Seen by gastroenterology and general surgery.  Most recent CT scan did not show any new changes.  Abdomen is benign on examination.   General surgery has signed off. Intravenous metoclopramide  was changed over to enteral.   Cancer-related pain. Seen by palliative care.  Currently on methadone , hydromorphone  as needed.   Dexamethasone  was discontinued due to concern for gastric irritation causing bleeding. Robaxin  was switched over to Valium  due to his complaints of dizziness.   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 Has been seen by oncology. Currently  patient needs to work on his nutritional status as well as functional status which appears to be very poor right now. When last seen by oncology a few days ago they recommended hospice to the patient. This can  be further addressed in the outpatient setting if the patient is discharged in the next 2 days.   Near syncope. Presented with dizziness and lightheadedness as well as near syncope in the clinic while having blood draws. Telemetry evaluation unremarkable. Carotid Doppler normal. Echocardiogram EF 60-65%, no significant valvular abnormality. Initial presentation could be secondary to uncontrolled pain versus vasovagal event. His ongoing dizziness is likely secondary to pain medications. Continue TED stockings. Meclizine  initiated.  Also on Valium . His symptoms have improved in the last 2 to 3 days.  He is able to ambulate without getting lightheaded.   Anemia secondary to nutritional deficiency as well as chronic GI blood loss. Hemoglobin dropped down to 5.3 on 12/24 requiring blood transfusion. Iron  level remains low, 23.  B12 263 relatively low, folic acid  4.1 low. INR elevated 1.6. Patient started on multivitamin folic acid .  Given iron  infusions. Patient was also given cyanocobalamin  injections and now on enteral cyanocobalamin .   Has been experiencing black stools on and off.  This is likely due to his GI tomorrow. He was transfused 2 units of PRBC on 9/6 for hemoglobin of 7.2.  Improvement in hemoglobin noted.  Will recheck labs again tomorrow.   Goals of care conversation. Patient currently wants to treat what is treatable. Wants to remain full code. Intermittently agreeable for medical intervention including tube feeds and then refuses. Currently does not appear to be a good candidate for chemotherapy due to his performance status. Previous rounding MD has discussed with patient along with medical oncology.  Prognosis thought to be poor.  In his current state he is not a candidate for any treatments per oncology note from 1/2.  Hospice was discussed with the patient.  Patient still thinking about it.  He states that he plans to go stay with his daughter when he feels better.   DVT  Prophylaxis: SCDs Code Status: Full code Family Communication: Discussed with patient Disposition Plan: Home health was recommended.  PT and OT to continue to see.  Anticipate discharge on 1/9 if hemoglobin remains stable.     Medications: Scheduled:  acetaminophen   650 mg Rectal Q6H   Or   acetaminophen  (TYLENOL ) oral liquid 160 mg/5 mL  650 mg Oral Q6H   Chlorhexidine  Gluconate Cloth  6 each Topical Daily   vitamin B-12  1,000 mcg Oral Daily   diazepam   2 mg Oral Q12H   feeding supplement  1 Container Oral TID BM   feeding supplement (OSMOLITE 1.5 CAL)  1,080 mL Per Tube Q24H   feeding supplement (PROSource TF20)  60 mL Per Tube BID   folic acid   1 mg Oral Daily   free water   100 mL Per Tube QID   methadone   5 mg Oral Q12H   metoCLOPramide   10 mg Oral TID AC & HS   multivitamin  15 mL Oral Daily   pantoprazole  (PROTONIX ) IV  40 mg Intravenous Q12H   polyethylene glycol  17 g Oral Daily   potassium chloride   40 mEq Oral Daily   simethicone   40 mg Oral QID   Continuous:   PRN:albuterol , diazepam , docusate, HYDROmorphone  HCl **OR** HYDROmorphone  (DILAUDID ) injection, iohexol , meclizine , ondansetron  **OR** ondansetron  (ZOFRAN ) IV, prochlorperazine , sodium phosphate , sorbitol   Antibiotics: Anti-infectives (From admission, onward)  Start     Dose/Rate Route Frequency Ordered Stop   06/18/23 1500  ceFAZolin  (ANCEF ) IVPB 2g/100 mL premix        2 g 200 mL/hr over 30 Minutes Intravenous On call to O.R. 06/18/23 1446 06/18/23 1600       Objective:  Vital Signs  Vitals:   07/08/23 1615 07/08/23 1840 07/08/23 2022 07/09/23 0611  BP: 110/75 (!) 108/59 119/70 120/68  Pulse: 72 73 72 84  Resp: 17 15 18 18   Temp: 97.8 F (36.6 C) 98.1 F (36.7 C) 98.7 F (37.1 C) 98.2 F (36.8 C)  TempSrc: Oral Oral Oral Oral  SpO2: 99% 98% 96% 98%  Weight:    80.8 kg  Height:        Intake/Output Summary (Last 24 hours) at 07/09/2023 1044 Last data filed at 07/09/2023 0100 Gross per  24 hour  Intake 3173.17 ml  Output --  Net 3173.17 ml    Filed Weights   07/05/23 0627 07/06/23 0500 07/09/23 0611  Weight: 80.9 kg 79.3 kg 80.8 kg   General appearance: Awake alert.  In no distress Resp: Clear to auscultation bilaterally.  Normal effort Cardio: S1-S2 is normal regular.  No S3-S4.  No rubs murmurs or bruit GI: Abdomen is soft.  Nontender nondistended.  Bowel sounds are present normal.  No masses organomegaly.  j-tube is noted.   Lab Results:  Data Reviewed: I have personally reviewed following labs and reports of the imaging studies  CBC: Recent Labs  Lab 07/03/23 0621 07/04/23 0605 07/05/23 0605 07/07/23 0610 07/07/23 1311 07/08/23 0558 07/08/23 2105 07/09/23 0610  WBC 14.7* 14.1* 12.7* 10.7* 12.4* 11.0*  --  10.8*  NEUTROABS 10.9* 10.5* 9.5*  --   --   --   --   --   HGB 8.9* 9.1* 8.7* 7.5* 7.4* 7.2* 9.7* 9.6*  HCT 28.8* 28.3* 27.5* 24.0* 23.9* 23.7* 31.0* 30.7*  MCV 86.0 85.5 86.5 87.0 86.6 87.8  --  84.8  PLT 385 388 380 323 309 319  --  308    Basic Metabolic Panel: Recent Labs  Lab 07/02/23 1746 07/03/23 0621 07/03/23 0622 07/03/23 1720 07/04/23 0605 07/04/23 1830 07/05/23 0623 07/06/23 0531 07/06/23 1740 07/07/23 0610 07/09/23 0610  NA  --   --    < >  --  130*  --  134* 133*  --  134* 132*  K  --   --    < >  --  3.4*  --  3.8 4.0  --  3.8 3.9  CL  --   --    < >  --  100  --  104 101  --  102 101  CO2  --   --    < >  --  22  --  23 25  --  26 26  GLUCOSE  --   --    < >  --  118*  --  132* 139*  --  146* 125*  BUN  --   --    < >  --  14  --  14 19  --  16 18  CREATININE  --   --    < >  --  0.51*  --  0.37* 0.51*  --  0.47* 0.54*  CALCIUM   --   --    < >  --  8.2*  --  8.4* 8.2*  --  8.3* 8.2*  MG 2.2 2.1  --  2.0  --   --   --   --   --  2.1  --   PHOS 3.6 3.7   < > 3.6 4.5 4.0 4.1 4.0  3.9 3.5  --   --    < > = values in this interval not displayed.    GFR: Estimated Creatinine Clearance: 114 mL/min (A) (by C-G formula  based on SCr of 0.54 mg/dL (L)).  Liver Function Tests: Recent Labs  Lab 07/03/23 0622 07/04/23 0605 07/05/23 0623 07/06/23 0531 07/09/23 0610  AST  --   --   --   --  24  ALT  --   --   --   --  50*  ALKPHOS  --   --   --   --  69  BILITOT  --   --   --   --  0.5  PROT  --   --   --   --  5.7*  ALBUMIN 2.4* 2.7* 2.5* 2.5* 2.8*    CBG: Recent Labs  Lab 07/02/23 2134 07/03/23 0025 07/03/23 0404  GLUCAP 154* 115* 115*    Radiology Studies: No results found.      LOS: 25 days   Casondra Gasca [employer] on [email or web address]  07/09/2023, 10:44 AM

## 2023-07-10 ENCOUNTER — Other Ambulatory Visit (HOSPITAL_COMMUNITY): Payer: Self-pay

## 2023-07-10 ENCOUNTER — Encounter: Payer: Self-pay | Admitting: Oncology

## 2023-07-10 DIAGNOSIS — C160 Malignant neoplasm of cardia: Secondary | ICD-10-CM | POA: Diagnosis not present

## 2023-07-10 DIAGNOSIS — D500 Iron deficiency anemia secondary to blood loss (chronic): Secondary | ICD-10-CM | POA: Diagnosis not present

## 2023-07-10 DIAGNOSIS — G893 Neoplasm related pain (acute) (chronic): Secondary | ICD-10-CM | POA: Diagnosis not present

## 2023-07-10 DIAGNOSIS — R627 Adult failure to thrive: Secondary | ICD-10-CM | POA: Diagnosis not present

## 2023-07-10 LAB — CBC
HCT: 29.2 % — ABNORMAL LOW (ref 39.0–52.0)
Hemoglobin: 8.9 g/dL — ABNORMAL LOW (ref 13.0–17.0)
MCH: 26.2 pg (ref 26.0–34.0)
MCHC: 30.5 g/dL (ref 30.0–36.0)
MCV: 85.9 fL (ref 80.0–100.0)
Platelets: 286 10*3/uL (ref 150–400)
RBC: 3.4 MIL/uL — ABNORMAL LOW (ref 4.22–5.81)
RDW: 17.5 % — ABNORMAL HIGH (ref 11.5–15.5)
WBC: 8.4 10*3/uL (ref 4.0–10.5)
nRBC: 0 % (ref 0.0–0.2)

## 2023-07-10 MED ORDER — MECLIZINE HCL 25 MG PO TABS
25.0000 mg | ORAL_TABLET | Freq: Three times a day (TID) | ORAL | 0 refills | Status: DC | PRN
  Filled 2023-07-10: qty 30, 10d supply, fill #0

## 2023-07-10 MED ORDER — POLYETHYLENE GLYCOL 3350 17 GM/SCOOP PO POWD
17.0000 g | Freq: Every day | ORAL | 0 refills | Status: DC
  Filled 2023-07-10: qty 476, 28d supply, fill #0

## 2023-07-10 MED ORDER — FOLIC ACID 1 MG PO TABS
1.0000 mg | ORAL_TABLET | Freq: Every day | ORAL | 2 refills | Status: DC
  Filled 2023-07-10: qty 30, 30d supply, fill #0
  Filled 2023-08-05: qty 30, 30d supply, fill #1
  Filled 2023-08-21 – 2023-09-06 (×2): qty 30, 30d supply, fill #2

## 2023-07-10 MED ORDER — OSMOLITE 1.5 CAL PO LIQD: ORAL | 0 refills | Status: DC

## 2023-07-10 MED ORDER — FREE WATER: 100.0000 mL | Freq: Four times a day (QID) | 1 refills | Status: DC

## 2023-07-10 MED ORDER — ONDANSETRON 4 MG PO TBDP
4.0000 mg | ORAL_TABLET | Freq: Three times a day (TID) | ORAL | 0 refills | Status: DC | PRN
  Filled 2023-07-10: qty 60, 20d supply, fill #0

## 2023-07-10 MED ORDER — PANTOPRAZOLE SODIUM 40 MG PO TBEC
40.0000 mg | DELAYED_RELEASE_TABLET | Freq: Two times a day (BID) | ORAL | 1 refills | Status: DC
  Filled 2023-07-10: qty 60, 30d supply, fill #0

## 2023-07-10 MED ORDER — HYDROMORPHONE HCL 2 MG PO TABS
2.0000 mg | ORAL_TABLET | ORAL | 0 refills | Status: DC | PRN
  Filled 2023-07-10: qty 60, 10d supply, fill #0

## 2023-07-10 MED ORDER — METHADONE HCL 5 MG PO TABS
5.0000 mg | ORAL_TABLET | Freq: Two times a day (BID) | ORAL | 0 refills | Status: DC
  Filled 2023-07-10 (×3): qty 60, 30d supply, fill #0

## 2023-07-10 MED ORDER — CYANOCOBALAMIN 1000 MCG PO TABS
1000.0000 ug | ORAL_TABLET | Freq: Every day | ORAL | 2 refills | Status: DC
  Filled 2023-07-10: qty 30, 30d supply, fill #0
  Filled 2023-08-05: qty 30, 30d supply, fill #1
  Filled 2023-09-06: qty 30, 30d supply, fill #2

## 2023-07-10 MED ORDER — POTASSIUM CHLORIDE 20 MEQ PO PACK
20.0000 meq | PACK | Freq: Every day | ORAL | 0 refills | Status: DC
  Filled 2023-07-10: qty 30, 30d supply, fill #0

## 2023-07-10 MED ORDER — SENNOSIDES-DOCUSATE SODIUM 8.6-50 MG PO TABS
2.0000 | ORAL_TABLET | Freq: Every day | ORAL | 2 refills | Status: DC
  Filled 2023-07-10: qty 60, 30d supply, fill #0
  Filled 2023-08-05: qty 60, 30d supply, fill #1
  Filled 2023-08-21: qty 60, 30d supply, fill #2

## 2023-07-10 MED ORDER — DIAZEPAM 2 MG PO TABS
2.0000 mg | ORAL_TABLET | Freq: Two times a day (BID) | ORAL | 0 refills | Status: DC
  Filled 2023-07-10: qty 60, 30d supply, fill #0

## 2023-07-10 MED ORDER — METOCLOPRAMIDE HCL 10 MG PO TABS
10.0000 mg | ORAL_TABLET | Freq: Three times a day (TID) | ORAL | 1 refills | Status: DC
  Filled 2023-07-10: qty 120, 30d supply, fill #0
  Filled 2023-08-05: qty 120, 30d supply, fill #1

## 2023-07-10 NOTE — Plan of Care (Deleted)

## 2023-07-10 NOTE — Plan of Care (Signed)
  Problem: Education: Goal: Knowledge of General Education information will improve Description: Including pain rating scale, medication(s)/side effects and non-pharmacologic comfort measures 07/10/2023 1344 by Alaina Dozier PARAS, RN Outcome: Progressing 07/10/2023 1343 by Alaina Dozier PARAS, RN Outcome: Progressing   Problem: Health Behavior/Discharge Planning: Goal: Ability to manage health-related needs will improve 07/10/2023 1344 by Alaina Dozier PARAS, RN Outcome: Progressing 07/10/2023 1343 by Alaina Dozier PARAS, RN Outcome: Progressing   Problem: Clinical Measurements: Goal: Ability to maintain clinical measurements within normal limits will improve Outcome: Progressing Goal: Will remain free from infection 07/10/2023 1344 by Alaina Dozier PARAS, RN Outcome: Progressing 07/10/2023 1343 by Alaina Dozier PARAS, RN Outcome: Progressing Goal: Diagnostic test results will improve Outcome: Progressing

## 2023-07-10 NOTE — TOC Progression Note (Addendum)
 Transition of Care [facility]) - Progression Note    Patient Details  Name: Jesus Haynes MRN: [id number] Date of Birth: [birth year]  [facility] CM/SW Contact  [name], RN Phone Number:[phone]  07/10/2023, 10:17 AM  Clinical Narrative:    Partially completed enteral order form is on chart. CM has updated MD that form needs to be completed to send for tube feeds and supplies.   1129 Enteral order form has been sent to [facility] with [employer] for LOG tp cover 30 day supply of Tube feeds and supplies.   1136 After speaking with Mitch about LOG it looks like patient may now have medicaid which will allow him to get supplies and medications. Thomasina will follow up for tube feeds. Message has also been sent to pharmacy to verify benefits for medications.   1249 Per Mitch with [employer] patients tube feedings and supplies have been approved and will be covered by medicaid. Tube feeds will be shipped per [employer]. Patient will need a few days supply to be sent home from the hospital. RN and MD have been updated.         Expected Discharge Plan and Services                                               Social Determinants of Health (SDOH) Interventions SDOH Screenings   Food Insecurity: No Food Insecurity (06/14/2023)  Housing: Low Risk  (06/14/2023)  Transportation Needs: No Transportation Needs (06/14/2023)  Utilities: Not At Risk (06/14/2023)  Depression (PHQ2-9): Low Risk  (06/12/2023)  Tobacco Use: Medium Risk (06/18/2023)    Readmission Risk Interventions     No data to display

## 2023-07-10 NOTE — Progress Notes (Signed)
 1610: Patient requested tube feeds to be disconnected. He said he felt a full sensation and we could restart them around mid day. Pt's tube flushed with water and disconnected per patient request.

## 2023-07-10 NOTE — Progress Notes (Signed)
 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 11/25 - 11/28 presents with dysphagia and abdominal pain was found to have adenocarcinoma of the GE junction.  Set up with hematology outpatient. 12/12 underwent Port-A-Cath placement. 12/13 due to dizziness while in the clinic was referred for direct admission.  Being treated for failure to thrive. 12/17 after failing conservative management with ongoing pain and difficulty swallowing underwent open placement of feeding jejunostomy tube with Dr. Leonor Dawn. 12/18 started on tube feeds Osmolite. 12/23 appears to have developed colonic ileus.  SBO protocol ordered.  EKG was consulted. 12/24 Hb dropped to 5.3.  Received 2 PRBC transfusion.  Recheck Hb 8.0 close to baseline. 12/25.  Patient requested to be off of tube feeding and has been refusing since as it was causing excessive fullness. 12/27 surgery signed off. 12/30.  Colonic ileus improving.  GI signed off. 1/1.  Reports abdominal distention after tube feeds, CT unremarkable for any worsening finding on ileus.  Cancer progression.  CT head was performed due to ongoing complaint of dizziness which is also negative.  Palliative care following.  Hematology will follow-up as well.  Prognosis is poor.   Consultants: Medical oncology.  General surgery  Procedures: J-tube placement    Subjective/Interval History: Patient sitting up on the bed.  Denies any complaints this morning.  Overall he feels well.  Still having dark stool but not as dark as before.  Pain is well-controlled.     Assessment/Plan:  Adult failure to thrive. Moderate protein calorie malnutrition  Presented with  dizziness and lightheadedness.  In the setting of poor p.o. intake from failure to thrive. Due to poor p.o. intake general surgery was consulted for consideration of feeding jejunostomy tube placement. After ensuring that the patient can receive tube feeding outpatient (due to lack of insurance) patient consented to undergo jejunostomy tube placement. Was started on tube feeding and as the patient developed ileus likely from narcotics due to refusal of bowel regimen patient has been refusing tube feeds since.  Will not be a candidate for bolus feeding given his jejunostomy tube. TPN was considered but currently not initiated since his GI tract appears to be functioning well Patient experienced some difficulty with tube feedings. CT scan of the abdomen was repeated and does not show any ileus.   Patient's tolerance of tube feedings has improved.  Dietitian is following.     Colonic ileus. Seen by gastroenterology and general surgery.  Most recent CT scan did not show any new changes.  Abdomen is benign on examination.   General surgery has signed off. Intravenous metoclopramide  was changed over to enteral.   Cancer-related pain. Seen by palliative care.  Currently on methadone , hydromorphone  as needed.   Dexamethasone  was discontinued due to concern for gastric irritation causing bleeding. Robaxin  was switched over to Valium  due to his complaints of dizziness.   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 Has been seen by oncology. Currently patient needs to work on his nutritional status as well as functional status which appears to be very poor right now. When last seen by oncology a few days ago they recommended hospice to the patient. This can be further addressed in the outpatient setting  if the patient is discharged in the next 2 days.   Near syncope. Presented with dizziness and lightheadedness as well as near syncope in the clinic while having blood draws. Telemetry evaluation  unremarkable. Carotid Doppler normal. Echocardiogram EF 60-65%, no significant valvular abnormality. Initial presentation could be secondary to uncontrolled pain versus vasovagal event. His ongoing dizziness is likely secondary to pain medications. Continue TED stockings. Meclizine  initiated.  Also on Valium . His symptoms have improved in the last 2 to 3 days.  He is able to ambulate without getting lightheaded.   Anemia secondary to nutritional deficiency as well as chronic GI blood loss. Hemoglobin dropped down to 5.3 on 12/24 requiring blood transfusion. Iron  level remains low, 23.  B12 263 relatively low, folic acid  4.1 low. INR elevated 1.6. Patient started on multivitamin folic acid .  Given iron  infusions. Patient was also given cyanocobalamin  injections and now on enteral cyanocobalamin .   Has been experiencing black stools on and off.  This is likely due to his GI tomorrow. He was transfused 2 units of PRBC on 9/6 for hemoglobin of 7.2.   Hemoglobin improved.  Noted to be 8.9 this morning.  Will recheck it again tomorrow.   Goals of care conversation. Patient currently wants to treat what is treatable. Wants to remain full code. Intermittently agreeable for medical intervention including tube feeds and then refuses. Currently does not appear to be a good candidate for chemotherapy due to his performance status. Previous rounding MD has discussed with patient along with medical oncology.  Prognosis thought to be poor.  In his current state he is not a candidate for any treatments per oncology note from 1/2.  Hospice was discussed with the patient.  Patient still thinking about it.  He states that he plans to go stay with his daughter when he feels better.  This can be further addressed in the outpatient setting.  DVT Prophylaxis: SCDs Code Status: Full code Family Communication: Discussed with patient Disposition Plan: Home health was recommended.  PT and OT to continue to see.   Anticipate discharge on 1/9 if hemoglobin remains stable.     Medications: Scheduled:  acetaminophen   650 mg Rectal Q6H   Or   acetaminophen  (TYLENOL ) oral liquid 160 mg/5 mL  650 mg Oral Q6H   Chlorhexidine  Gluconate Cloth  6 each Topical Daily   vitamin B-12  1,000 mcg Oral Daily   diazepam   2 mg Oral Q12H   feeding supplement  1 Container Oral TID BM   feeding supplement (OSMOLITE 1.5 CAL)  1,080 mL Per Tube Q24H   feeding supplement (PROSource TF20)  60 mL Per Tube BID   folic acid   1 mg Oral Daily   free water   100 mL Per Tube QID   methadone   5 mg Oral Q12H   metoCLOPramide   10 mg Oral TID AC & HS   multivitamin  15 mL Oral Daily   pantoprazole  (PROTONIX ) IV  40 mg Intravenous Q12H   polyethylene glycol  17 g Oral Daily   potassium chloride   40 mEq Oral Daily   simethicone   40 mg Oral QID   Continuous:   PRN:albuterol , diazepam , docusate, HYDROmorphone  HCl **OR** HYDROmorphone  (DILAUDID ) injection, iohexol , meclizine , ondansetron  **OR** ondansetron  (ZOFRAN ) IV, prochlorperazine , sodium phosphate , sorbitol   Antibiotics: Anti-infectives (From admission, onward)    Start     Dose/Rate Route Frequency Ordered Stop   06/18/23 1500  ceFAZolin  (ANCEF ) IVPB 2g/100 mL premix        2 g  200 mL/hr over 30 Minutes Intravenous On call to O.R. 06/18/23 1446 06/18/23 1600       Objective:  Vital Signs  Vitals:   07/09/23 0611 07/09/23 1417 07/09/23 2200 07/10/23 0446  BP: 120/68 106/68 121/68 118/70  Pulse: 84 76 91 90  Resp: 18 20 14 16   Temp: 98.2 F (36.8 C) 98.7 F (37.1 C) 99.4 F (37.4 C) 98.8 F (37.1 C)  TempSrc: Oral Oral Oral Oral  SpO2: 98% 95% 97% 95%  Weight: 80.8 kg   80.8 kg  Height:        Intake/Output Summary (Last 24 hours) at 07/10/2023 0940 Last data filed at 07/09/2023 2024 Gross per 24 hour  Intake --  Output 200 ml  Net -200 ml    Filed Weights   07/06/23 0500 07/09/23 0611 07/10/23 0446  Weight: 79.3 kg 80.8 kg 80.8 kg    General  appearance: Awake alert.  In no distress Resp: Clear to auscultation bilaterally.  Normal effort Cardio: S1-S2 is normal regular.  No S3-S4.  No rubs murmurs or bruit GI: Abdomen is soft.  Nontender nondistended.  Bowel sounds are present normal.  No masses organomegaly. j-tube is noted.   Lab Results:  Data Reviewed: I have personally reviewed following labs and reports of the imaging studies  CBC: Recent Labs  Lab 07/04/23 0605 07/05/23 0605 07/07/23 0610 07/07/23 1311 07/08/23 0558 07/08/23 2105 07/09/23 0610 07/10/23 0500  WBC 14.1* 12.7* 10.7* 12.4* 11.0*  --  10.8* 8.4  NEUTROABS 10.5* 9.5*  --   --   --   --   --   --   HGB 9.1* 8.7* 7.5* 7.4* 7.2* 9.7* 9.6* 8.9*  HCT 28.3* 27.5* 24.0* 23.9* 23.7* 31.0* 30.7* 29.2*  MCV 85.5 86.5 87.0 86.6 87.8  --  84.8 85.9  PLT 388 380 323 309 319  --  308 286    Basic Metabolic Panel: Recent Labs  Lab 07/03/23 1720 07/04/23 0605 07/04/23 1830 07/05/23 0623 07/06/23 0531 07/06/23 1740 07/07/23 0610 07/09/23 0610  NA  --  130*  --  134* 133*  --  134* 132*  K  --  3.4*  --  3.8 4.0  --  3.8 3.9  CL  --  100  --  104 101  --  102 101  CO2  --  22  --  23 25  --  26 26  GLUCOSE  --  118*  --  132* 139*  --  146* 125*  BUN  --  14  --  14 19  --  16 18  CREATININE  --  0.51*  --  0.37* 0.51*  --  0.47* 0.54*  CALCIUM   --  8.2*  --  8.4* 8.2*  --  8.3* 8.2*  MG 2.0  --   --   --   --   --  2.1  --   PHOS 3.6 4.5 4.0 4.1 4.0  3.9 3.5  --   --     GFR: Estimated Creatinine Clearance: 114 mL/min (A) (by C-G formula based on SCr of 0.54 mg/dL (L)).  Liver Function Tests: Recent Labs  Lab 07/04/23 0605 07/05/23 0623 07/06/23 0531 07/09/23 0610  AST  --   --   --  24  ALT  --   --   --  50*  ALKPHOS  --   --   --  69  BILITOT  --   --   --  0.5  PROT  --   --   --  5.7*  ALBUMIN 2.7* 2.5* 2.5* 2.8*    Radiology Studies: No results found.      LOS: 26 days   Graciela Plato [employer] on  [email or web address]  07/10/2023, 9:40 AM

## 2023-07-10 NOTE — Progress Notes (Addendum)
 [facility] arrived to patients bedside and tried educating him on how the feeding pump worked, pt declines at this time and prefers for everything to be left at the bedside, boxed up so that he can remove it easier.

## 2023-07-11 ENCOUNTER — Other Ambulatory Visit (HOSPITAL_COMMUNITY): Payer: Self-pay

## 2023-07-11 ENCOUNTER — Encounter: Payer: Self-pay | Admitting: Oncology

## 2023-07-11 ENCOUNTER — Other Ambulatory Visit: Payer: Self-pay | Admitting: Oncology

## 2023-07-11 DIAGNOSIS — C160 Malignant neoplasm of cardia: Secondary | ICD-10-CM

## 2023-07-11 LAB — BASIC METABOLIC PANEL
Anion gap: 8 (ref 5–15)
BUN: 14 mg/dL (ref 6–20)
CO2: 27 mmol/L (ref 22–32)
Calcium: 8 mg/dL — ABNORMAL LOW (ref 8.9–10.3)
Chloride: 97 mmol/L — ABNORMAL LOW (ref 98–111)
Creatinine, Ser: 0.51 mg/dL — ABNORMAL LOW (ref 0.61–1.24)
GFR, Estimated: >60 mL/min (ref >60)
Glucose, Bld: 129 mg/dL — ABNORMAL HIGH (ref 70–99)
Potassium: 4 mmol/L (ref 3.5–5.1)
Sodium: 132 mmol/L — ABNORMAL LOW (ref 135–145)

## 2023-07-11 LAB — CBC
HCT: 31.6 % — ABNORMAL LOW (ref 39.0–52.0)
Hemoglobin: 9.3 g/dL — ABNORMAL LOW (ref 13.0–17.0)
MCH: 25.8 pg — ABNORMAL LOW (ref 26.0–34.0)
MCHC: 29.4 g/dL — ABNORMAL LOW (ref 30.0–36.0)
MCV: 87.5 fL (ref 80.0–100.0)
Platelets: 316 10*3/uL (ref 150–400)
RBC: 3.61 MIL/uL — ABNORMAL LOW (ref 4.22–5.81)
RDW: 17 % — ABNORMAL HIGH (ref 11.5–15.5)
WBC: 9.1 10*3/uL (ref 4.0–10.5)
nRBC: 0 % (ref 0.0–0.2)

## 2023-07-11 MED ORDER — DEXAMETHASONE 4 MG PO TABS
8.0000 mg | ORAL_TABLET | Freq: Every day | ORAL | 1 refills | Status: DC
  Filled 2023-07-11: qty 30, 15d supply, fill #0

## 2023-07-11 MED ORDER — PROCHLORPERAZINE MALEATE 10 MG PO TABS
10.0000 mg | ORAL_TABLET | Freq: Four times a day (QID) | ORAL | 1 refills | Status: DC | PRN
  Filled 2023-07-11: qty 30, 8d supply, fill #0
  Filled 2023-08-05: qty 30, 8d supply, fill #1

## 2023-07-11 MED ORDER — ONDANSETRON HCL 8 MG PO TABS
8.0000 mg | ORAL_TABLET | Freq: Three times a day (TID) | ORAL | 1 refills | Status: DC | PRN
  Filled 2023-07-11: qty 30, 10d supply, fill #0
  Filled 2023-07-21 – 2023-08-05 (×2): qty 30, 10d supply, fill #1

## 2023-07-11 MED ORDER — LIDOCAINE-PRILOCAINE 2.5-2.5 % EX CREA
TOPICAL_CREAM | CUTANEOUS | 3 refills | Status: DC
  Filled 2023-07-11: qty 30, 30d supply, fill #0
  Filled 2023-09-01 – 2023-09-04 (×4): qty 30, 30d supply, fill #1

## 2023-07-11 MED ORDER — HEPARIN SOD (PORK) LOCK FLUSH 100 UNIT/ML IV SOLN
500.0000 [IU] | Freq: Once | INTRAVENOUS | Status: AC
  Administered 2023-07-11: 500 [IU] via INTRAVENOUS
  Filled 2023-07-11: qty 5

## 2023-07-11 NOTE — Progress Notes (Signed)
 Pt and family have questions regarding d/c to home. Pt had refused teaching with [facility] yesterday for tube feed/pump at home. Pt is currently utilizing a Kangaroo pump, but will be going home on an infinity pump. This RN will request that [facility] return to teach patient since the Infinity pump is not one that is used at [employer]. Primary RN updated. Chaplain in room to discuss Medical Directives w/ patient. Shower chair is not covered under insurance and family will need to obtain on their own. TOC also updated

## 2023-07-11 NOTE — Plan of Care (Signed)

## 2023-07-11 NOTE — Progress Notes (Addendum)
 Chaplain responded to page from pt's RN, asking for assistance getting AD paperwork signed and notarized. Chaplain spoke with pt, answered his remaining questions, reviewed his completed paperwork. Ad paperwork was signed in presence of notary and 2 witnesses. 3 copies and original given to pt, one copy placed in pt's chart on unit, in addition to being scanned and emailed to Medical Records.  [address], [place]   07/11/23 1300  Spiritual Encounters  Type of Visit Follow up  Care provided to: Patient;Family  Conversation partners present during encounter Nurse  Referral source Patient request  Reason for visit Advance directives  OnCall Visit No  Spiritual Framework  Presenting Themes Goals in life/care  Community/Connection Family  Patient Stress Factors Health changes  Intervention Outcomes  Outcomes Connection to spiritual care;Awareness around self/spiritual resourses;Awareness of support

## 2023-07-11 NOTE — Discharge Summary (Signed)
 [facility]  Physician Discharge Summary   Patient ID: Jesus Haynes MRN: Haynes DOB/AGE: 01/15/1976 48 y.o.  Admit date: 06/14/2023 Discharge date:   07/11/2023   PCP: [name], NP  DISCHARGE DIAGNOSES:    Adenocarcinoma of gastroesophageal junction (HCC)   Anemia due to chronic blood loss   Cancer associated pain   Asthma   Constipation   Feeding Jejunostomy tube in place   RECOMMENDATIONS FOR OUTPATIENT FOLLOW UP: Will send a message to oncology to arrange outpatient follow-up   Home Health: PT OT Equipment/Devices: Tube feeding equipment  CODE STATUS: Full code  DISCHARGE CONDITION: fair  Diet recommendation: tube feeding  INITIAL HISTORY: 48 year old male with PMH of adenocarcinoma of the GE junction with a fungating mass with dysphagia and pain, anemia from chronic blood loss presented to hospital with complaints of worsening pain and near syncope. 11/25 - 11/28 presents with dysphagia and abdominal pain was found to have adenocarcinoma of the GE junction.  Set up with hematology outpatient. 12/12 underwent Port-A-Cath placement. 12/13 due to dizziness while in the clinic was referred for direct admission.  Being treated for failure to thrive. 12/17 after failing conservative management with ongoing pain and difficulty swallowing underwent open placement of feeding jejunostomy tube with Dr. Leonor Dawn. 12/18 started on tube feeds Osmolite. 12/23 appears to have developed colonic ileus.  SBO protocol ordered.  EKG was consulted. 12/24 Hb dropped to 5.3.  Received 2 PRBC transfusion.  Recheck Hb 8.0 close to baseline. 12/25.  Patient requested to be off of tube feeding and has been refusing since as it was causing excessive fullness. 12/27 surgery signed off. 12/30.  Colonic ileus improving.  GI signed off. 1/1.  Reports abdominal distention after tube feeds, CT unremarkable for any worsening finding on ileus.  Cancer progression.  CT head  was performed due to ongoing complaint of dizziness which is also negative.  Palliative care following.  Hematology will follow-up as well.  Prognosis is poor.    HOSPITAL COURSE:   Adult failure to thrive. Moderate protein calorie malnutrition  Presented with dizziness and lightheadedness.  In the setting of poor p.o. intake from failure to thrive. Due to poor p.o. intake general surgery was consulted for consideration of feeding jejunostomy tube placement. After ensuring that the patient can receive tube feeding outpatient (due to lack of insurance) patient consented to undergo jejunostomy tube placement. Was started on tube feeding and as the patient developed ileus likely from narcotics due to refusal of bowel regimen patient has been refusing tube feeds since.  Will not be a candidate for bolus feeding given his jejunostomy tube. TPN was considered but currently not initiated since his GI tract appears to be functioning well Patient experienced some difficulty with tube feedings. CT scan of the abdomen was repeated and does not show any ileus.   Patient's tolerance of tube feedings has improved in the last 4 to 5 days.  Will be discharged on tube feeding regimen.   Colonic ileus. Seen by gastroenterology and general surgery.  Most recent CT scan did not show any new changes.  Abdomen is benign on examination.   General surgery has signed off. Intravenous metoclopramide  was changed over to enteral.   Cancer-related pain. Seen by palliative care.  Currently on methadone , hydromorphone  as needed.   Dexamethasone  was discontinued due to concern for gastric irritation causing bleeding. Robaxin  was switched over to Valium  due to his complaints of dizziness.   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 Has been seen by oncology. Currently patient needs to work on his nutritional status as well as functional status which appears to be very poor right now. When last seen by oncology a few  days ago they recommended hospice to the patient. This can be further addressed in the outpatient setting.  Will send a message to oncology the patient seems to have improved and to arrange outpatient follow-up.   Near syncope. Presented with dizziness and lightheadedness as well as near syncope in the clinic while having blood draws. Telemetry evaluation unremarkable. Carotid Doppler normal. Echocardiogram EF 60-65%, no significant valvular abnormality. Initial presentation could be secondary to uncontrolled pain versus vasovagal event. His ongoing dizziness is likely secondary to pain medications. Continue TED stockings. Meclizine  initiated.  Also on Valium . His symptoms have improved.  He is able to ambulate without getting lightheaded.   Anemia secondary to nutritional deficiency as well as chronic GI blood loss. Hemoglobin dropped down to 5.3 on 12/24 requiring blood transfusion. Iron  level remains low, 23.  B12 263 relatively low, folic acid  4.1 low. INR elevated 1.6. Patient started on multivitamin folic acid .  Given iron  infusions. Patient was also given cyanocobalamin  injections and now on enteral cyanocobalamin .   Has been experiencing black stools on and off.  This is likely due to his GI tomorrow. He was transfused 2 units of PRBC on 9/6 for hemoglobin of 7.2.   Hemoglobin improved.  Has been stable for the last 3 days.   Goals of care conversation. Patient currently wants to treat what is treatable. Wants to remain full code. Intermittently agreeable for medical intervention including tube feeds and then refuses. Currently does not appear to be a good candidate for chemotherapy due to his performance status. Previous rounding MD has discussed with patient along with medical oncology.  Prognosis thought to be poor.  In his current state he is not a candidate for any treatments per oncology note from 1/2.  Hospice was discussed with the patient.  Patient still thinking about it.   He states that he plans to go stay with his daughter when he feels better.  This can be further addressed in the outpatient setting.   Patient is stable.  Okay for discharge home with his daughter.   PERTINENT LABS:  The results of significant diagnostics from this hospitalization (including imaging, microbiology, ancillary and laboratory) are listed below for reference.     Labs:   Basic Metabolic Panel: Recent Labs  Lab 07/04/23 1830 07/05/23 9376 07/06/23 0531 07/06/23 1740 07/07/23 0610 07/09/23 0610 07/11/23 0525  NA  --  134* 133*  --  134* 132* 132*  K  --  3.8 4.0  --  3.8 3.9 4.0  CL  --  104 101  --  102 101 97*  CO2  --  23 25  --  26 26 27   GLUCOSE  --  132* 139*  --  146* 125* 129*  BUN  --  14 19  --  16 18 14   CREATININE  --  0.37* 0.51*  --  0.47* 0.54* 0.51*  CALCIUM   --  8.4* 8.2*  --  8.3* 8.2* 8.0*  MG  --   --   --   --  2.1  --   --   PHOS 4.0 4.1 4.0  3.9 3.5  --   --   --    Liver Function Tests: Recent Labs  Lab 07/05/23 0623 07/06/23 0531 07/09/23 0610  AST  --   --  24  ALT  --   --  50*  ALKPHOS  --   --  69  BILITOT  --   --  0.5  PROT  --   --  5.7*  ALBUMIN 2.5* 2.5* 2.8*    CBC: Recent Labs  Lab 07/05/23 0605 07/07/23 0610 07/07/23 1311 07/08/23 0558 07/08/23 2105 07/09/23 0610 07/10/23 0500 07/11/23 0525  WBC 12.7*   < > 12.4* 11.0*  --  10.8* 8.4 9.1  NEUTROABS 9.5*  --   --   --   --   --   --   --   HGB 8.7*   < > 7.4* 7.2* 9.7* 9.6* 8.9* 9.3*  HCT 27.5*   < > 23.9* 23.7* 31.0* 30.7* 29.2* 31.6*  MCV 86.5   < > 86.6 87.8  --  84.8 85.9 87.5  PLT 380   < > 309 319  --  308 286 316   < > = values in this interval not displayed.     IMAGING STUDIES CT ABDOMEN PELVIS W CONTRAST Result Date: 07/03/2023 CLINICAL DATA:  Bowel obstruction, colonic ileus on previous x-ray, history of adenocarcinoma at the gastroesophageal junction EXAM: CT ABDOMEN AND PELVIS WITH CONTRAST TECHNIQUE: Multidetector CT imaging of the  abdomen and pelvis was performed using the standard protocol following bolus administration of intravenous contrast. RADIATION DOSE REDUCTION: This exam was performed according to the departmental dose-optimization program which includes automated exposure control, adjustment of the mA and/or kV according to patient size and/or use of iterative reconstruction technique. CONTRAST:  OMNIPAQUE  IOHEXOL  300 MG/ML  SOLN COMPARISON:  06/26/2023, 06/07/2023 FINDINGS: Lower chest: Trace bilateral pleural effusions with dependent lower lobe atelectasis. Infiltrative soft tissue mass involving the distal esophagus and proximal stomach is again noted. Posterior mediastinal adenopathy measuring up to 12 mm consistent with metastatic disease. Hepatobiliary: Stable hepatic cysts. Otherwise unremarkable appearance of the liver and gallbladder. Pancreas: The infiltrative mass at the gastroesophageal junction involves the superior margin of the pancreatic body, similar to prior exam. This area measures approximately 5.2 x 4.2 cm reference image 32/3. No pancreatic duct dilation. Spleen: Normal in size without focal abnormality. Adrenals/Urinary Tract: Adrenal glands are unremarkable. Kidneys are normal, without renal calculi, focal lesion, or hydronephrosis. Bladder is unremarkable. Stomach/Bowel: Infiltrative mass involving the distal esophagus, gastric cardia, and gastric fundus is again identified, consistent with known history of malignancy. There is increased surrounding mesenteric stranding, as well is progressive irregular mural thickening of the gastric fundus and body. There is a percutaneous jejunostomy catheter in stable position. No evidence of small-bowel obstruction. Continued diffuse colonic distension with oral contrast and gas fluid levels throughout the colon, consistent with adynamic colonic ileus. Normal appendix right lower quadrant. Vascular/Lymphatic: Lymphadenopathy is seen along the stomach, gastric  hepatic ligament, and porta hepatis. Index lymph nodes are as follows: Porta hepatis, image 31/3, 18 mm.  Previously 14 mm. Anterior mesenteric, image 29/3, 19 mm.  Previously 17 mm. Retroperitoneal, image 42/3, 15 mm.  Previously 15 mm. 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 Reproductive: Prostate is unremarkable. Other: Trace right upper quadrant ascites. No free intraperitoneal gas. No abdominal wall hernia. Musculoskeletal: No acute or destructive bony abnormalities. Reconstructed images demonstrate no additional findings. IMPRESSION: 1. Large infiltrative mass centered at the gastric cardia, involving the distal esophagus, gastric fundus/body, and superior margin of the pancreas. Overall increase in surrounding mesenteric stranding as well as irregular mural thickening of the stomach  consistent with disease progression. 2. Metastatic lymphadenopathy within the retroperitoneum, upper abdomen, and posterior mediastinum, slightly progressive since prior exam. 3. Trace bilateral pleural effusions. 4. Diffuse colonic distension, with oral contrast throughout the colon as well as diffuse gas fluid levels, consistent with adynamic colonic ileus. No evidence of small-bowel obstruction. 5. Stable percutaneous jejunostomy tube. 6. Trace ascites. Electronically Signed   By: Ozell Daring M.D.   On: 07/03/2023 15:25   CT HEAD WO CONTRAST ( ) Result Date: 07/03/2023 CLINICAL DATA:  Syncope/presyncope, cerebrovascular cause suspected. EXAM: CT HEAD WITHOUT CONTRAST TECHNIQUE: Contiguous axial images were obtained from the base of the skull through the vertex without intravenous contrast. RADIATION DOSE REDUCTION: This exam was performed according to the departmental dose-optimization program which includes automated exposure control, adjustment of the mA and/or kV according to patient size and/or use of iterative reconstruction technique. COMPARISON:  None  Available. FINDINGS: Brain: No acute intracranial hemorrhage. Gray-white differentiation is preserved. No hydrocephalus or extra-axial collection. No mass effect or midline shift. Vascular: No hyperdense vessel or unexpected calcification. Skull: No calvarial fracture or suspicious bone lesion. Skull base is unremarkable. Sinuses/Orbits: No acute finding. Other: None. IMPRESSION: No acute intracranial abnormality. Electronically Signed   By: Ryan Chess M.D.   On: 07/03/2023 15:10   DG Abd 2 Views Result Date: 07/01/2023 CLINICAL DATA:  Ileus. EXAM: ABDOMEN - 2 VIEW COMPARISON:  Abdominal x-ray dated June 29, 2023. FINDINGS: Jejunostomy tube again noted. Unchanged gaseous distention of the colon with air-fluid levels. Interval partial clearing of the colonic oral contrast. No obvious small bowel dilatation. No acute osseous abnormality. IMPRESSION: 1. Unchanged colonic ileus. Electronically Signed   By: Elsie ONEIDA Shoulder M.D.   On: 07/01/2023 14:08   DG Abd 2 Views Result Date: 06/29/2023 CLINICAL DATA:  48 year old male with history of ileus. EXAM: ABDOMEN - 2 VIEW COMPARISON:  Abdominal radiograph 06/24/2023. FINDINGS: Oral contrast material is noted in the colon and rectum. Multiple air-fluid levels are noted on the upright projection, however, these appear to be colonic. No definite pathologic dilatation of small bowel noted. No pneumoperitoneum. Percutaneous gastrostomy tube noted projecting over the left side of the abdomen. IMPRESSION: 1. Nonobstructive bowel gas pattern. 2. No pneumoperitoneum. Electronically Signed   By: Toribio Aye M.D.   On: 06/29/2023 08:56   CT ABDOMEN PELVIS W CONTRAST Result Date: 06/26/2023 CLINICAL DATA:  History of gastroesophageal junction tumor with acute abdominal pain status post J-tube placement for failure to thrive. * Tracking Code: BO * EXAM: CT ABDOMEN AND PELVIS WITH CONTRAST TECHNIQUE: Multidetector CT imaging of the abdomen and pelvis was  performed using the standard protocol following bolus administration of intravenous contrast. RADIATION DOSE REDUCTION: This exam was performed according to the departmental dose-optimization program which includes automated exposure control, adjustment of the mA and/or kV according to patient size and/or use of iterative reconstruction technique. CONTRAST:  OMNIPAQUE  IOHEXOL  300 MG/ML  SOLN COMPARISON:  Nuclear medicine PET dated 06/07/2023, CT abdomen and pelvis dated 05/28/2023 FINDINGS: Lower chest: Bilateral lower lobe subsegmental atelectasis. New trace right pleural effusion. Partially imaged heart size is normal. Hepatobiliary: Scattered hepatic hypodensities measuring up to 12 mm in segment 5/6 (2:28), likely cysts. No intra or extrahepatic biliary ductal dilation. Normal gallbladder. Pancreas: Diffuse thickening of the pancreas is again seen with heterogeneous enhancement, inseparable from hypoenhancing mass extending inferiorly from the gastric cardia into the pancreatic neck/body. In conglomerate, this area measures approximately 5.4 x 4.6 cm (2:26), previously 4.8 by 3.6 cm. Spleen: Normal  in size without focal abnormality. Adrenals/Urinary Tract: No adrenal nodules. No suspicious renal mass, calculi or hydronephrosis. No focal bladder wall thickening. Stomach/Bowel: Infiltrative soft tissue mass of the gastroesophageal junction in keeping with known malignancy. With tip terminating in the right upper quadrant. The small bowel along the jejunostomy catheter is decompressed. Enteric contrast material is present throughout the colon. The ascending colon is diffusely dilated. Normal appendix. 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 Aortic atherosclerosis. Multi station upper abdominal and retroperitoneal lymphadenopathy is again seen. A few lymph nodes are markedly increased in size, for example 14 mm periportal (2:25), previously 7 mm and 17 mm  anterior midline mesenteric (2:22), previously 11 mm, and others similar to minimally increased in size, for example for example 12 mm paraesophageal (2:12), previously 10 mm and 10 mm right retrocrural (2:23), unchanged. Reproductive: Prostate is unremarkable. Other: Trace ascites.  No free air or fluid collection. Musculoskeletal: No acute or abnormal lytic or blastic osseous lesions. Small fat-containing paraumbilical and bilateral inguinal hernias. IMPRESSION: 1. Infiltrative soft tissue mass of the gastroesophageal junction in keeping with known malignancy with interval increase in size of hypoenhancing mass extending inferiorly from the gastric cardia into the pancreatic neck/body. 2. Interval placement of jejunostomy catheter with tip terminating in the right upper quadrant. The small bowel along the jejunostomy catheter is decompressed and not well evaluated. The ascending colon is diffusely dilated, likely ileus. 3. Interval increase in size of multi station upper abdominal and retroperitoneal lymphadenopathy, in keeping with metastatic disease. 4. New trace right pleural effusion. 5. Aortic Atherosclerosis (ICD10-I70.0). Electronically Signed   By: Limin  Xu M.D.   On: 06/26/2023 18:01   DG Abd Portable 1V-Small Bowel Obstruction Protocol-initial, 8 hr delay Result Date: 06/25/2023 CLINICAL DATA:  8 hour delay for small bowel obstruction EXAM: PORTABLE ABDOMEN - 1 VIEW COMPARISON:  06/21/2023 FINDINGS: On the 8 hour image, there is contrast throughout the colon into the rectum. Continued gaseous distention of the colon. No small bowel dilatation. Change in ostomy catheter again noted in the left abdomen. IMPRESSION: Contrast throughout the colon into the rectum. No evidence of small bowel obstruction. Electronically Signed   By: Franky Crease M.D.   On: 06/25/2023 00:16   DG Abd 1 View Result Date: 06/21/2023 CLINICAL DATA:  Constipation EXAM: ABDOMEN - 1 VIEW COMPARISON:  None Available. FINDINGS:  Gas-filled, distended colon, without large burden of stool and gas present to the rectum. Percutaneous enterostomy tube projects over the left hemiabdomen. No free air in the abdomen on supine radiographs. IMPRESSION: 1. Gas-filled, distended colon, without large burden of stool and gas present to the rectum. 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 2. Percutaneous enterostomy tube projects over the left hemiabdomen. Electronically Signed   By: Marolyn JONETTA Jaksch M.D.   On: 06/21/2023 17:31   VAS US  CAROTID Result Date: 06/17/2023 Carotid Arterial Duplex Study Patient Name:  Jesus Haynes  Date of Exam:   06/16/2023 Medical Rec #: Haynes       Accession #:    [id number] Date of Birth: 07-09-1975      Patient Gender: M Patient Age:   18 years Exam Location:  [facility] Procedure:      VAS US  CAROTID Referring Phys: ALM ORTIZ --------------------------------------------------------------------------------  Indications:       Syncope. Comparison Study:  No prior exam. Performing Technologist: Edilia Elden Appl  Examination Guidelines: A complete evaluation includes B-mode imaging, spectral Doppler, color Doppler, and  power Doppler as needed of all accessible portions of each vessel. Bilateral testing is considered an integral part of a complete examination. Limited examinations for reoccurring indications may be performed as noted.  Right Carotid Findings: +----------+--------+--------+--------+------------------+--------+           PSV cm/sEDV cm/sStenosisPlaque DescriptionComments +----------+--------+--------+--------+------------------+--------+ CCA Prox  125     26                                         +----------+--------+--------+--------+------------------+--------+ CCA Distal101     25                                         +----------+--------+--------+--------+------------------+--------+ ICA Prox  71      24                                 tortuous +----------+--------+--------+--------+------------------+--------+ ICA Mid   75      27                                tortuous +----------+--------+--------+--------+------------------+--------+ ICA Distal63      27                                tortuous +----------+--------+--------+--------+------------------+--------+ ECA       81      13                                         +----------+--------+--------+--------+------------------+--------+ +----------+--------+-------+------------+-------------------+           PSV cm/sEDV cmsDescribe    Arm Pressure (mmHG) +----------+--------+-------+------------+-------------------+ Subclavian               Not assessed                    +----------+--------+-------+------------+-------------------+ +---------+--------+--+--------+--+ VertebralPSV cm/s74EDV cm/s24 +---------+--------+--+--------+--+ Recent port placement, right subclavian artery not visualized due to bandages. Left Carotid Findings: +----------+--------+--------+--------+------------------+--------+           PSV cm/sEDV cm/sStenosisPlaque DescriptionComments +----------+--------+--------+--------+------------------+--------+ CCA Prox  141     24                                         +----------+--------+--------+--------+------------------+--------+ CCA Distal95      21                                         +----------+--------+--------+--------+------------------+--------+ ICA Prox  117     39                                         +----------+--------+--------+--------+------------------+--------+ ICA Mid   102     30                                         +----------+--------+--------+--------+------------------+--------+  ICA Distal95      38                                         +----------+--------+--------+--------+------------------+--------+ ECA       97      13                                          +----------+--------+--------+--------+------------------+--------+ +----------+--------+--------+--------+-------------------+           PSV cm/sEDV cm/sDescribeArm Pressure (mmHG) +----------+--------+--------+--------+-------------------+ [id number]                                         +----------+--------+--------+--------+-------------------+ +---------+--------+--+--------+--+ VertebralPSV cm/s71EDV cm/s28 +---------+--------+--+--------+--+   Summary: Right Carotid: The ECA appears <50% stenosed. The extracranial vessels were                near-normal with only minimal wall thickening or plaque. Left Carotid: Velocities in the left ICA are consistent with a 1-39% stenosis.               The ECA appears <50% stenosed. Vertebrals:  Bilateral vertebral arteries demonstrate antegrade flow. Subclavians: Right subclavian artery was not visualized. Normal flow              hemodynamics were seen in the left subclavian artery. *See table(s) above for measurements and observations.  Electronically signed by Debby Robertson on 06/17/2023 at 7:33:58 AM.    Final    ECHOCARDIOGRAM COMPLETE Result Date: 06/15/2023    ECHOCARDIOGRAM REPORT   Patient Name:   Jesus Haynes Date of Exam: 06/15/2023 Medical Rec #:  Haynes      Height:       66.0 in Accession #:    [id number]     Weight:       174.0 lb Date of Birth:  22-Jan-1976     BSA:          1.885 m Patient Age:    47 years       BP:           113/68 mmHg Patient Gender: M              HR:           70 bpm. Exam Location:  Inpatient Procedure: 2D Echo Indications:   syncope  History:       Patient has no prior history of Echocardiogram examinations.                Cancer.  Sonographer:   Tinnie Barefoot RDCS Referring      [phone] DAVID MANUEL ORTIZ Phys: IMPRESSIONS  1. Left ventricular ejection fraction, by estimation, is 60 to 65%. The left ventricle has normal function. The left ventricle has no regional wall  motion abnormalities. Left ventricular diastolic parameters were normal.  2. Right ventricular systolic function is normal. The right ventricular size is normal.  3. The mitral valve is normal in structure. No evidence of mitral valve regurgitation. No evidence of mitral stenosis.  4. The aortic valve is normal in structure. Aortic valve regurgitation is not visualized. No aortic stenosis is present.  5. The inferior vena cava is normal in size with greater  than 50% respiratory variability, suggesting right atrial pressure of 3 mmHg. FINDINGS  Left Ventricle: Left ventricular ejection fraction, by estimation, is 60 to 65%. The left ventricle has normal function. The left ventricle has no regional wall motion abnormalities. The left ventricular internal cavity size was normal in size. There is  no left ventricular hypertrophy. Left ventricular diastolic parameters were normal. Right Ventricle: The right ventricular size is normal. No increase in right ventricular wall thickness. Right ventricular systolic function is normal. Left Atrium: Left atrial size was normal in size. Right Atrium: Right atrial size was normal in size. Pericardium: There is no evidence of pericardial effusion. Mitral Valve: The mitral valve is normal in structure. No evidence of mitral valve regurgitation. No evidence of mitral valve stenosis. Tricuspid Valve: The tricuspid valve is normal in structure. Tricuspid valve regurgitation is not demonstrated. No evidence of tricuspid stenosis. Aortic Valve: The aortic valve is normal in structure. Aortic valve regurgitation is not visualized. No aortic stenosis is present. Pulmonic Valve: The pulmonic valve was normal in structure. Pulmonic valve regurgitation is not visualized. No evidence of pulmonic stenosis. Aorta: The aortic root is normal in size and structure. Venous: The inferior vena cava is normal in size with greater than 50% respiratory variability, suggesting right atrial pressure of 3  mmHg. IAS/Shunts: No atrial level shunt detected by color flow Doppler.  LEFT VENTRICLE PLAX 2D LVIDd:         4.70 cm   Diastology LVIDs:         2.60 cm   LV e' medial:    11.20 cm/s LV PW:         0.90 cm   LV E/e' medial:  5.7 LV IVS:        0.90 cm   LV e' lateral:   13.10 cm/s LVOT diam:     2.10 cm   LV E/e' lateral: 4.9 LV SV:         73 LV SV Index:   39 LVOT Area:     3.46 cm  RIGHT VENTRICLE             IVC RV Basal diam:  2.50 cm     IVC diam: 1.40 cm RV S prime:     17.60 cm/s TAPSE (M-mode): 2.7 cm LEFT ATRIUM             Index        RIGHT ATRIUM           Index LA diam:        3.60 cm 1.91 cm/m   RA Area:     16.20 cm LA Vol (A2C):   42.0 ml 22.28 ml/m  RA Volume:   41.10 ml  21.81 ml/m LA Vol (A4C):   43.5 ml 23.08 ml/m LA Biplane Vol: 44.5 ml 23.61 ml/m  AORTIC VALVE LVOT Vmax:   111.00 cm/s LVOT Vmean:  72.800 cm/s LVOT VTI:    0.210 m  AORTA Ao Root diam: 3.70 cm Ao Asc diam:  3.60 cm MITRAL VALVE MV Area (PHT): 4.39 cm    SHUNTS MV Decel Time: 173 msec    Systemic VTI:  0.21 m MV E velocity: 64.30 cm/s  Systemic Diam: 2.10 cm MV A velocity: 63.00 cm/s MV E/A ratio:  1.02 Mihai Croitoru MD Electronically signed by Jerel Balding MD Signature Date/Time: 06/15/2023/5:08:37 PM    Final    IR IMAGING GUIDED PORT INSERTION Result Date: 06/13/2023 INDICATION: Adenocarcinoma of the gastroesophageal junction.  Port-A-Cath needed for treatment. EXAM: FLUOROSCOPIC AND ULTRASOUND GUIDED PLACEMENT OF A SUBCUTANEOUS PORT COMPARISON:  None Available. MEDICATIONS: Moderate sedation ANESTHESIA/SEDATION: Moderate (conscious) sedation was employed during this procedure. A total of Versed  3 mg and fentanyl  150 mcg was administered intravenously at the order of the provider performing the procedure. Total intra-service moderate sedation time: 29 minutes. Patient's level of consciousness and vital signs were monitored continuously by radiology nurse throughout the procedure under the supervision of the  provider performing the procedure. FLUOROSCOPY TIME:  Radiation Exposure Index (as provided by the fluoroscopic device): 1 mGy Kerma COMPLICATIONS: None immediate. PROCEDURE: The procedure, risks, benefits, and alternatives were explained to the patient. Questions regarding the procedure were encouraged and answered. The patient understands and consents to the procedure. Patient was placed supine on the interventional table. Ultrasound confirmed a patent right internal jugular vein. Ultrasound image was saved for documentation. The right chest and neck were cleaned with a skin antiseptic and a sterile drape was placed. Maximal barrier sterile technique was utilized including caps, mask, sterile gowns, sterile gloves, sterile drape, hand hygiene and skin antiseptic. The right neck was anesthetized with 1% lidocaine . Small incision was made in the right neck with a blade. Micropuncture set was placed in the right internal jugular vein with ultrasound guidance. The micropuncture wire was used for measurement purposes. The right chest was anesthetized with 1% lidocaine  with epinephrine . #15 blade was used to make an incision and a subcutaneous port pocket was formed. 8 french Power Port was assembled. Subcutaneous tunnel was formed with a stiff tunneling device. The port catheter was brought through the subcutaneous tunnel. The port was placed in the subcutaneous pocket. The micropuncture set was exchanged for a peel-away sheath. The catheter was placed through the peel-away sheath and the tip was positioned at the superior cavoatrial junction. Catheter placement was confirmed with fluoroscopy. The port was accessed and flushed with heparinized saline. The port pocket was closed using two layers of absorbable sutures and Dermabond. The vein skin site was closed using a single layer of absorbable suture and Dermabond. Sterile dressings were applied. Patient tolerated the procedure well without an immediate complication.  Ultrasound and fluoroscopic images were taken and saved for this procedure. IMPRESSION: Placement of a subcutaneous power-injectable port device. Catheter tip at the superior cavoatrial junction. Electronically Signed   By: Juliene Balder M.D.   On: 06/13/2023 13:39    DISCHARGE EXAMINATION: Vitals:   07/10/23 1312 07/10/23 2333 07/11/23 0601 07/11/23 1001  BP: 117/64 130/82 117/73   Pulse: 82 90 82   Resp: 20 18 18    Temp: 98.2 F (36.8 C) 98.4 F (36.9 C) 98.5 F (36.9 C)   TempSrc: Oral Oral Oral   SpO2: 96% 97% 99% 93%  Weight:      Height:       General appearance: Awake alert.  In no distress Resp: Clear to auscultation bilaterally.  Normal effort Cardio: S1-S2 is normal regular.  No S3-S4.  No rubs murmurs or bruit GI: Abdomen is soft.  Nontender nondistended.  Bowel sounds are present normal.  No masses organomegaly  DISPOSITION: Home  Discharge Instructions     Amb Referral to Palliative Care   Complete by: As directed    Call MD for:  difficulty breathing, headache or visual disturbances   Complete by: As directed    Call MD for:  persistant dizziness or light-headedness   Complete by: As directed    Call MD for:  persistant nausea and  vomiting   Complete by: As directed    Call MD for:  severe uncontrolled pain   Complete by: As directed    Call MD for:  temperature >100.4   Complete by: As directed    Diet - low sodium heart healthy   Complete by: As directed    Discharge instructions   Complete by: As directed    Please take your medications as prescribed.  A message will be sent to your oncologist to schedule an appointment for you in the next 2 weeks.  You were cared for by a hospitalist during your hospital stay. If you have any questions about your discharge medications or the care you received while you were in the hospital after you are discharged, you can call the unit and asked to speak with the hospitalist on call if the hospitalist that took care of you  is not available. Once you are discharged, your primary care physician will handle any further medical issues. Please note that NO REFILLS for any discharge medications will be authorized once you are discharged, as it is imperative that you return to your primary care physician (or establish a relationship with a primary care physician if you do not have one) for your aftercare needs so that they can reassess your need for medications and monitor your lab values. If you do not have a primary care physician, you can call [phone] for a physician referral.   Increase activity slowly   Complete by: As directed          Allergies as of 07/11/2023   No Known Allergies      Medication List     STOP taking these medications    ondansetron  4 MG tablet Commonly known as: ZOFRAN    oxyCODONE  5 MG/5ML solution Commonly known as: ROXICODONE    traZODone  50 MG tablet Commonly known as: DESYREL        TAKE these medications    albuterol  108 (90 Base) MCG/ACT inhaler Commonly known as: VENTOLIN  HFA Inhale 1-2 puffs into the lungs every 4 (four) hours as needed for wheezing or shortness of breath.   albuterol  (5 MG/ML) 0.5% nebulizer solution Commonly known as: PROVENTIL  Take 0.5 mLs (2.5 mg total) by nebulization every 6 (six) hours as needed for wheezing or shortness of breath.   B-12 1000 MCG Tabs Take 1 tablet (1,000 mcg total) by mouth daily.   diazepam  2 MG tablet Commonly known as: Valium  Take 1 tablet (2 mg total) by mouth 2 (two) times daily.   feeding supplement (OSMOLITE 1.5 CAL) Liqd At 60 mL/h over 18 hours on a daily basis. What changed:  how much to take how to take this when to take this additional instructions   folic acid  1 MG tablet Commonly known as: FOLVITE  Take 1 tablet (1 mg total) by mouth daily.   free water  Soln Place 100 mLs into feeding tube 4 (four) times daily.   HYDROmorphone  2 MG tablet Commonly known as: DILAUDID  Take 1 tablet (2 mg total)  by mouth every 4 (four) hours as needed for severe pain (pain score 7-10).   meclizine  25 MG tablet Commonly known as: ANTIVERT  Take 1 tablet (25 mg total) by mouth 3 (three) times daily as needed for dizziness.   methadone  5 MG tablet Commonly known as: DOLOPHINE  Take 1 tablet (5 mg total) by mouth every 12 (twelve) hours.   metoCLOPramide  10 MG tablet Commonly known as: REGLAN  Take 1 tablet (10 mg total) by mouth 4 (  four) times daily -  before meals and at bedtime.   ondansetron  4 MG disintegrating tablet Commonly known as: ZOFRAN -ODT Dissolve 1 tablet (4 mg total) in mouth every 8 (eight) hours as needed. What changed: reasons to take this   pantoprazole  40 MG tablet Commonly known as: Protonix  Take 1 tablet (40 mg total) by mouth 2 (two) times daily. What changed: when to take this   polyethylene glycol powder 17 GM/SCOOP powder Commonly known as: GLYCOLAX /MIRALAX  Dissolve 17 g in 4 oz of liquid and take by mouth daily.   potassium chloride  20 MEQ packet Commonly known as: KLOR-CON  Take 20 mEq by mouth daily.   Stool Softener/Laxative 50-8.6 MG tablet Generic drug: senna-docusate Take 2 tablets by mouth at bedtime.          Follow-up Information     Dasie Leonor CROME, MD Follow up today.   Specialty: General Surgery Why: As needed Contact information: [address] [place] [address] [phone]                 TOTAL DISCHARGE TIME: 35 minutes  Kendrick Remigio Verdene  [facility] Pager on [email or web address]  07/11/2023, 11:22 AM

## 2023-07-11 NOTE — Progress Notes (Signed)
 PT Cancellation Note  Patient Details Name: Jesus Haynes MRN: [id number] DOB: 27-Oct-1975   Cancelled Treatment:    Reason Eval/Treat Not Completed: Patient declined, no reason specified politely refuses PT, didn't sleep well last night. Agreeable to PT returning, per nursing DC may be early afternoon. Will attempt to return if time/schedule allow.   Josette Rough, PT, DPT 07/11/23 11:26 AM

## 2023-07-11 NOTE — Plan of Care (Addendum)
 Patient discharge paperwork in place, medications picked up from outpatient pharmacy and hand delivered to patient. Patient feels more comfortable if daughter is at the bedside to do the teaching and discharge instructions. Primary nurse Notified. 1030: Patient and nurse attempted to get in touch with daughter Dena. Unable to respond atm. Osmolite 1.5 picked up from pharmacy and hand delivered to patient. 1300: Mother in law and patient's daughter at the bedside. [employer] reported pt refused teaching 07/10/23 and Infinity pump, tube feed equipment delivered. [employer] called and voicemail left to inquire about teaching since the Infinity pump is not used in hospital setting. Family requesting information about changing code status to DNR and Dr. Verdene at the bedside speaking to them about it. 1400: [employer] care called and will send someone within an hour to go over teaching. 1700: Flushes administered by daughter Dena, this RN educated family and Ashley demonstrated understanding by physically flushing tube. PT medicated with PO Dilaudid  and family to transport patient home. All equipment, teaching, and discharge paperwork went over with family, mother in law, and Brianna at the bedside.  Problem: Education: Goal: Knowledge of General Education information will improve Description: Including pain rating scale, medication(s)/side effects and non-pharmacologic comfort measures Outcome: Progressing   Problem: Health Behavior/Discharge Planning: Goal: Ability to manage health-related needs will improve Outcome: Progressing   Problem: Clinical Measurements: Goal: Ability to maintain clinical measurements within normal limits will improve Outcome: Progressing Goal: Will remain free from infection Outcome: Progressing Goal: Diagnostic test results will improve Outcome: Progressing Goal: Respiratory complications will improve Outcome: Progressing Goal: Cardiovascular complication will be  avoided Outcome: Progressing   Problem: Activity: Goal: Risk for activity intolerance will decrease Outcome: Progressing   Problem: Nutrition: Goal: Adequate nutrition will be maintained Outcome: Progressing   Problem: Coping: Goal: Level of anxiety will decrease Outcome: Progressing   Problem: Elimination: Goal: Will not experience complications related to bowel motility Outcome: Progressing Goal: Will not experience complications related to urinary retention Outcome: Progressing   Problem: Pain Management: Goal: General experience of comfort will improve Outcome: Progressing   Problem: Safety: Goal: Ability to remain free from injury will improve Outcome: Progressing   Problem: Skin Integrity: Goal: Risk for impaired skin integrity will decrease Outcome: Progressing

## 2023-07-11 NOTE — Plan of Care (Signed)

## 2023-07-11 NOTE — TOC Transition Note (Addendum)
 Transition of Care [facility]) - Discharge Note   Patient Details  Name: Jesus Haynes MRN: [id number] Date of Birth: [birth year]  [facility] CM/SW Contact:  [name], RN Phone [phone]  07/11/2023, 12:39 PM   Clinical Narrative:    Patient with discharge orders. Patient has previously and continues to refuse HH. Patients tube feeds and supplies have been set up to be delivered to the home per [employer]. DME feeding pump and pole have been delivered to the bedside where staff reports that patient refused DME education. Hospital has provided patient with 3 days supply of tube feeds to last until his shipment arrives. Pharmacy has delivered meds to bedside. Patient now has new medicaid coverage to cover cost of medications. There are currently no other TOC needs. TOC will sign off.      Barriers to Discharge: No Barriers Identified   Patient Goals and CMS Choice   CMS Medicare.gov Compare Post Acute Care list provided to:: Patient Choice offered to / list presented to : Patient      Discharge Placement                       Discharge Plan and Services Additional resources added to the After Visit Summary for                            [facility] Arranged: Patient Refused HH (Patient has refused HH or outpt PT on numerous occasions)          Social Drivers of Health (SDOH) Interventions SDOH Screenings   Food Insecurity: No Food Insecurity (06/14/2023)  Housing: Low Risk  (06/14/2023)  Transportation Needs: No Transportation Needs (06/14/2023)  Utilities: Not At Risk (06/14/2023)  Depression (PHQ2-9): Low Risk  (06/12/2023)  Tobacco Use: Medium Risk (06/18/2023)     Readmission Risk Interventions     No data to display

## 2023-07-11 NOTE — Progress Notes (Signed)
 Medications picked up from outpatient [facility] and dropped of to patient. Primary RN to review Discharge teaching instructions and medications with patient

## 2023-07-12 ENCOUNTER — Telehealth: Payer: Self-pay

## 2023-07-12 ENCOUNTER — Encounter: Payer: Self-pay | Admitting: Oncology

## 2023-07-12 ENCOUNTER — Emergency Department (HOSPITAL_COMMUNITY)
Admission: EM | Admit: 2023-07-12 | Discharge: 2023-07-12 | Disposition: A | Discharge status: Home / Self Care | Payer: Medicaid Other | Attending: Emergency Medicine | Admitting: Emergency Medicine

## 2023-07-12 ENCOUNTER — Other Ambulatory Visit (HOSPITAL_COMMUNITY): Payer: Self-pay

## 2023-07-12 ENCOUNTER — Encounter (HOSPITAL_COMMUNITY): Payer: Self-pay

## 2023-07-12 DIAGNOSIS — Z8501 Personal history of malignant neoplasm of esophagus: Secondary | ICD-10-CM | POA: Insufficient documentation

## 2023-07-12 DIAGNOSIS — Z434 Encounter for attention to other artificial openings of digestive tract: Secondary | ICD-10-CM | POA: Diagnosis present

## 2023-07-12 NOTE — Telephone Encounter (Signed)
 Patient called to report he did not have a luer lock for his newly placed J-tube and was having some difficulty. He was instructed to either wait for return call from [facility] while staff followed up with a plan or go the the emergency department. While formulating a plan timely, it was noted in the patient's record that he was currently at the Emergency Department. No further action was needed from [facility] at this time as he is currently indicated in the Emergency Department.

## 2023-07-12 NOTE — ED Triage Notes (Addendum)
 Pt arrives via POV. Pt reports he was discharged from the hospital 1 day ago. He states they sent him home with the wrong supplies for him to be able to use his J. Tube. Pt AxOx4.

## 2023-07-12 NOTE — Telephone Encounter (Signed)
 Pt called with relative kelly, asking for a flush cap for pt J-tube. Pt informed that we do not have the flush in stock and provided GI number to reach out to and scheduled upcoming palliative appointment. No further needs at this time.

## 2023-07-12 NOTE — ED Provider Notes (Signed)
 [place] EMERGENCY DEPARTMENT AT [facility] Provider Note   CSN: [id number] Arrival date & time: 07/12/23  1427     History  Chief Complaint  Patient presents with   Feeding Tube issue    Jesus Haynes is a 48 y.o. male.  The history is provided by the patient and medical records. No language interpreter was used.     48 year old male history of adenocarcinoma of the gastroesophageal junction, recently hospitalized in the hospital due to having near syncope due to poor p.o. intake and failure to thrive.  Patient had a feeding jejunostomy tube placement and was discharged home yesterday.  Patient states he was sent home with wrong supply including not having a pleural lock flush for his J-tube for him to use appropriately and return to the ER requesting for the appropriate part.  No other complaint.  Home Medications Prior to Admission medications   Medication Sig Start Date End Date Taking? Authorizing Provider  albuterol  (PROVENTIL ) (5 MG/ML) 0.5% nebulizer solution Take 0.5 mLs (2.5 mg total) by nebulization every 6 (six) hours as needed for wheezing or shortness of breath. 04/28/23   Vicky Charleston, PA-C  albuterol  (VENTOLIN  HFA) 108 (90 Base) MCG/ACT inhaler Inhale 1-2 puffs into the lungs every 4 (four) hours as needed for wheezing or shortness of breath. 06/20/22   Vonn Hadassah LABOR, PA-C  cyanocobalamin  1000 MCG tablet Take 1 tablet (1,000 mcg total) by mouth daily. 07/10/23   Krishnan, Gokul, MD  dexamethasone  (DECADRON ) 4 MG tablet Take 2 tablets (8 mg total) by mouth daily. Start the day after chemotherapy for 2 days. Take with food. 07/11/23   Pasam, Chinita, MD  diazepam  (VALIUM ) 2 MG tablet Take 1 tablet (2 mg total) by mouth 2 (two) times daily. 07/10/23 08/10/23  Krishnan, Gokul, MD  folic acid  (FOLVITE ) 1 MG tablet Take 1 tablet (1 mg total) by mouth daily. 07/10/23   Krishnan, Gokul, MD  HYDROmorphone  (DILAUDID ) 2 MG tablet Take 1 tablet (2 mg total) by mouth  every 4 (four) hours as needed for severe pain (pain score 7-10). 07/10/23   Krishnan, Gokul, MD  lidocaine -prilocaine  (EMLA ) cream Apply to affected area once 07/11/23   Pasam, Avinash, MD  meclizine  (ANTIVERT ) 25 MG tablet Take 1 tablet (25 mg total) by mouth 3 (three) times daily as needed for dizziness. 07/10/23   Krishnan, Gokul, MD  methadone  (DOLOPHINE ) 5 MG tablet Take 1 tablet (5 mg total) by mouth every 12 (twelve) hours. 07/10/23   Krishnan, Gokul, MD  metoCLOPramide  (REGLAN ) 10 MG tablet Take 1 tablet (10 mg total) by mouth 4 (four) times daily -  before meals and at bedtime. 07/10/23   Verdene Purchase, MD  Nutritional Supplements (FEEDING SUPPLEMENT, OSMOLITE 1.5 CAL,) LIQD At 60 mL/h over 18 hours on a daily basis. 07/10/23   Krishnan, Gokul, MD  ondansetron  (ZOFRAN ) 8 MG tablet Take 1 tablet (8 mg total) by mouth every 8 (eight) hours as needed for nausea or vomiting. Start on the third day after chemotherapy. 07/11/23   Pasam, Chinita, MD  pantoprazole  (PROTONIX ) 40 MG tablet Take 1 tablet (40 mg total) by mouth 2 (two) times daily. 07/10/23 09/08/23  Krishnan, Gokul, MD  polyethylene glycol powder (GLYCOLAX /MIRALAX ) 17 GM/SCOOP powder Dissolve 17 g in 4 oz of liquid and take by mouth daily. 07/10/23   Krishnan, Gokul, MD  potassium chloride  (KLOR-CON ) 20 MEQ packet Take 20 mEq by mouth daily. 07/10/23   Krishnan, Gokul, MD  prochlorperazine  (COMPAZINE ) 10  MG tablet Take 1 tablet (10 mg total) by mouth every 6 (six) hours as needed for nausea or vomiting. 07/11/23   Pasam, Chinita, MD  senna-docusate (SENOKOT-S) 8.6-50 MG tablet Take 2 tablets by mouth at bedtime. 07/10/23   Krishnan, Gokul, MD  Water  For Irrigation, Sterile (FREE WATER ) SOLN Place 100 mLs into feeding tube 4 (four) times daily. 07/10/23   Krishnan, Gokul, MD  cetirizine  (ZYRTEC ) 10 MG tablet Take 1 tablet (10 mg total) by mouth daily. Patient not taking: Reported on 01/26/2019 02/23/18 07/26/20  Maczis, Michael M, PA-C  fluticasone  (FLONASE ) 50  MCG/ACT nasal spray Place 1-2 sprays into both nostrils daily. Patient not taking: Reported on 01/26/2019 02/23/18 07/26/20  Maczis, Michael M, PA-C      Allergies    Patient has no known allergies.    Review of Systems   Review of Systems  Constitutional:  Negative for fever.    Physical Exam Updated Vital Signs BP 104/83 (BP Location: Right Arm)   Pulse 92   Temp 98.9 F (37.2 C) (Oral)   Resp 16   SpO2 95%  Physical Exam Vitals and nursing note reviewed.  Constitutional:      General: He is not in acute distress.    Appearance: He is well-developed.  HENT:     Head: Atraumatic.  Eyes:     Conjunctiva/sclera: Conjunctivae normal.  Abdominal:     Comments: Jejunostomy tube leaking out fluid and is missing a luer lock  Musculoskeletal:     Cervical back: Neck supple.  Skin:    Findings: No rash.  Neurological:     Mental Status: He is alert.     ED Results / Procedures / Treatments   Labs (all labs ordered are listed, but only abnormal results are displayed) Labs Reviewed - No data to display  EKG None  Radiology No results found.  Procedures Procedures    Medications Ordered in ED Medications - No data to display  ED Course/ Medical Decision Making/ A&P                                 Medical Decision Making  BP 104/83 (BP Location: Right Arm)   Pulse 92   Temp 98.9 F (37.2 C) (Oral)   Resp 16   SpO2 95%   94:61 PM  48 year old male history of adenocarcinoma of the gastroesophageal junction, recently hospitalized in the hospital due to having near syncope due to poor p.o. intake and failure to thrive.  Patient had a feeding jejunostomy tube placement and was discharged home yesterday.  Patient states he was sent home with wrong supply including not having a pleural lock flush for his J-tube for him to use appropriately and return to the ER requesting for the appropriate part.  No other complaint.  On exam, patient has his jejunostomy tube in  place however it is missing and Luer-Lok and it is actively draining fluid.  Will request for replacement part.  Otherwise patient is stable for discharge.        Final Clinical Impression(s) / ED Diagnoses Final diagnoses:  Encounter for jejunostomy care [facility]    Rx / DC Orders ED Discharge Orders     None         Nivia Colon, PA-C 07/12/23 1516    Bari Roxie HERO, DO 07/12/23 1916

## 2023-07-13 ENCOUNTER — Other Ambulatory Visit: Payer: Self-pay

## 2023-07-14 ENCOUNTER — Encounter: Payer: Self-pay | Admitting: Oncology

## 2023-07-16 ENCOUNTER — Other Ambulatory Visit (HOSPITAL_COMMUNITY): Payer: Self-pay

## 2023-07-16 ENCOUNTER — Telehealth: Payer: Self-pay | Admitting: Oncology

## 2023-07-16 ENCOUNTER — Telehealth: Payer: Self-pay

## 2023-07-16 ENCOUNTER — Other Ambulatory Visit: Payer: Self-pay

## 2023-07-16 NOTE — Telephone Encounter (Signed)
 Marland Kitchen

## 2023-07-16 NOTE — Telephone Encounter (Signed)
 Pt relative [employer] called, reviewed appts with her, no further needs at this time.

## 2023-07-17 ENCOUNTER — Other Ambulatory Visit: Payer: Self-pay

## 2023-07-17 ENCOUNTER — Other Ambulatory Visit: Payer: Self-pay | Admitting: Oncology

## 2023-07-17 NOTE — Progress Notes (Addendum)
Palliative Medicine [facility]  [phone]) [phone] Fax:([phone]) [phone]   Name: NIKOLAI PARADEE Date: 07/17/2023 MRN: [id number]  DOB: [birth year]  Patient Care Team: Ivonne Andrew, NP as PCP - General (Pulmonary Disease) Ripley Fraise Edmund Hilda Care Management Pasam, Archie Patten, MD as Consulting Physician (Oncology) Vida Rigger, MD as Consulting Physician (Gastroenterology)    REASON FOR CONSULTATION: ACKEEM BAILIN is a 48 y.o. 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 Palliative ask to see for symptom management and goals of care.    SOCIAL HISTORY:     reports that he quit smoking about 10 years ago. His smoking use included cigarettes. He has never used smokeless tobacco. He reports that he does not drink alcohol and does not use drugs.  ADVANCE DIRECTIVES:  Advanced directives on file naming Karleen Hampshire as the primary and Jabarri Interrante as the secondary decision makers should Mr.Goar become unable to make decisions for himself.   CODE STATUS: Full code  PAST MEDICAL HISTORY: Past Medical History:  Diagnosis Date   Asthma     PAST SURGICAL HISTORY:  Past Surgical History:  Procedure Laterality Date   BIOPSY  05/28/2023   Procedure: BIOPSY;  Surgeon: Vida Rigger, MD;  Location: [facility];  Service: Gastroenterology;;   ESOPHAGOGASTRODUODENOSCOPY (EGD) WITH PROPOFOL N/A 05/28/2023   Procedure: ESOPHAGOGASTRODUODENOSCOPY (EGD) WITH PROPOFOL;  Surgeon: Vida Rigger, MD;  Location: [facility];  Service: Gastroenterology;  Laterality: N/A;   IR IMAGING GUIDED PORT INSERTION  06/13/2023   UMBILICAL HERNIA REPAIR N/A 06/18/2023   Procedure: PLACEMENT OF J TUBE;  Surgeon: Fritzi Mandes, MD;  Location: [facility];  Service: General;  Laterality: N/A;    HEMATOLOGY/ONCOLOGY HISTORY:  Oncology History   Adenocarcinoma of gastroesophageal junction (HCC)  05/29/2023 Initial Diagnosis   Adenocarcinoma of gastroesophageal junction (HCC)   07/17/2023 -  Chemotherapy   Patient is on Treatment Plan : GASTROESOPHAGEAL FOLFOX + Nivolumab q14d       ALLERGIES:  has no known allergies.  MEDICATIONS:  Current Outpatient Medications  Medication Sig Dispense Refill   albuterol (PROVENTIL) (5 MG/ML) 0.5% nebulizer solution Take 0.5 mLs (2.5 mg total) by nebulization every 6 (six) hours as needed for wheezing or shortness of breath. 20 mL 3   albuterol (VENTOLIN HFA) 108 (90 Base) MCG/ACT inhaler Inhale 1-2 puffs into the lungs every 4 (four) hours as needed for wheezing or shortness of breath. 18 g 3   cyanocobalamin 1000 MCG tablet Take 1 tablet (1,000 mcg total) by mouth daily. 30 tablet 2   dexamethasone (DECADRON) 4 MG tablet Take 2 tablets (8 mg total) by mouth daily. Start the day after chemotherapy for 2 days. Take with food. 30 tablet 1   diazepam (VALIUM) 2 MG tablet Take 1 tablet (2 mg total) by mouth 2 (two) times daily. 60 tablet 0   folic acid (FOLVITE) 1 MG tablet Take 1 tablet (1 mg total) by mouth daily. 30 tablet 2   HYDROmorphone (DILAUDID) 2 MG tablet Take 1 tablet (2 mg total) by mouth every 4 (four) hours as needed for severe pain (pain score 7-10). 60 tablet 0   lidocaine-prilocaine (EMLA) cream Apply to affected area once 30 g 3   meclizine (ANTIVERT) 25 MG tablet Take 1 tablet (25 mg total) by mouth 3 (three) times daily as needed for dizziness. 30 tablet 0   methadone (DOLOPHINE) 5  MG tablet Take 1 tablet (5 mg total) by mouth every 12 (twelve) hours. 60 tablet 0   metoCLOPramide (REGLAN) 10 MG tablet Take 1 tablet (10 mg total) by mouth 4 (four) times daily -  before meals and at bedtime. 120 tablet 1   Nutritional Supplements (FEEDING SUPPLEMENT, OSMOLITE 1.5 CAL,) LIQD At 60 mL/h over 18 hours on a daily basis. 30000 mL 0   ondansetron (ZOFRAN) 8 MG tablet Take 1 tablet (8 mg  total) by mouth every 8 (eight) hours as needed for nausea or vomiting. Start on the third day after chemotherapy. 30 tablet 1   pantoprazole (PROTONIX) 40 MG tablet Take 1 tablet (40 mg total) by mouth 2 (two) times daily. 60 tablet 1   polyethylene glycol powder (GLYCOLAX/MIRALAX) 17 GM/SCOOP powder Dissolve 17 g in 4 oz of liquid and take by mouth daily. 476 g 0   potassium chloride (KLOR-CON) 20 MEQ packet Take 20 mEq by mouth daily. 30 packet 0   prochlorperazine (COMPAZINE) 10 MG tablet Take 1 tablet (10 mg total) by mouth every 6 (six) hours as needed for nausea or vomiting. 30 tablet 1   senna-docusate (SENOKOT-S) 8.6-50 MG tablet Take 2 tablets by mouth at bedtime. 60 tablet 2   Water For Irrigation, Sterile (FREE WATER) SOLN Place 100 mLs into feeding tube 4 (four) times daily. 12000 mL 1   No current facility-administered medications for this visit.    VITAL SIGNS: There were no vitals taken for this visit. There were no vitals filed for this visit.  Estimated body mass index is 28.75 kg/m as calculated from the following:   Height as of 06/26/23: 5\' 6"  (1.676 m).   Weight as of 07/10/23: 178 lb 2.1 oz (80.8 kg).  LABS: CBC:    Component Value Date/Time   WBC 9.1 07/11/2023 0525   HGB 9.3 (L) 07/11/2023 0525   HGB 10.5 (L) 06/12/2023 1413   HCT 31.6 (L) 07/11/2023 0525   HCT 34.2 (L) 06/12/2023 1413   PLT 316 07/11/2023 0525   PLT 543 (H) 06/12/2023 1413   MCV 87.5 07/11/2023 0525   MCV 82 06/12/2023 1413   NEUTROABS 9.5 (H) 07/05/2023 0605   LYMPHSABS 2.0 07/05/2023 0605   MONOABS 1.1 (H) 07/05/2023 0605   EOSABS 0.1 07/05/2023 0605   BASOSABS 0.0 07/05/2023 0605   Comprehensive Metabolic Panel:    Component Value Date/Time   NA 132 (L) 07/11/2023 0525   NA 139 06/12/2023 1413   K 4.0 07/11/2023 0525   CL 97 (L) 07/11/2023 0525   CO2 27 07/11/2023 0525   BUN 14 07/11/2023 0525   BUN 14 06/12/2023 1413   CREATININE 0.51 (L) 07/11/2023 0525   GLUCOSE 129 (H)  07/11/2023 0525   CALCIUM 8.0 (L) 07/11/2023 0525   AST 24 07/09/2023 0610   ALT 50 (H) 07/09/2023 0610   ALKPHOS 69 07/09/2023 0610   BILITOT 0.5 07/09/2023 0610   BILITOT 0.4 06/12/2023 1413   PROT 5.7 (L) 07/09/2023 0610   PROT 7.0 06/12/2023 1413   ALBUMIN 2.8 (L) 07/09/2023 0610   ALBUMIN 3.9 (L) 06/12/2023 1413    RADIOGRAPHIC STUDIES: CT ABDOMEN PELVIS W CONTRAST Result Date: 07/03/2023 CLINICAL DATA:  Bowel obstruction, colonic ileus on previous x-ray, history of adenocarcinoma at the gastroesophageal junction EXAM: CT ABDOMEN AND PELVIS WITH CONTRAST TECHNIQUE: Multidetector CT imaging of the abdomen and pelvis was performed using the standard protocol following bolus administration of intravenous contrast. RADIATION DOSE REDUCTION: This exam was  performed according to the departmental dose-optimization program which includes automated exposure control, adjustment of the mA and/or kV according to patient size and/or use of iterative reconstruction technique. CONTRAST:  OMNIPAQUE IOHEXOL 300 MG/ML  SOLN COMPARISON:  06/26/2023, 06/07/2023 FINDINGS: Lower chest: Trace bilateral pleural effusions with dependent lower lobe atelectasis. Infiltrative soft tissue mass involving the distal esophagus and proximal stomach is again noted. Posterior mediastinal adenopathy measuring up to 12 mm consistent with metastatic disease. Hepatobiliary: Stable hepatic cysts. Otherwise unremarkable appearance of the liver and gallbladder. Pancreas: The infiltrative mass at the gastroesophageal junction involves the superior margin of the pancreatic body, similar to prior exam. This area measures approximately 5.2 x 4.2 cm reference image 32/3. No pancreatic duct dilation. Spleen: Normal in size without focal abnormality. Adrenals/Urinary Tract: Adrenal glands are unremarkable. Kidneys are normal, without renal calculi, focal lesion, or hydronephrosis. Bladder is unremarkable. Stomach/Bowel: Infiltrative mass  involving the distal esophagus, gastric cardia, and gastric fundus is again identified, consistent with known history of malignancy. There is increased surrounding mesenteric stranding, as well is progressive irregular mural thickening of the gastric fundus and body. There is a percutaneous jejunostomy catheter in stable position. No evidence of small-bowel obstruction. Continued diffuse colonic distension with oral contrast and gas fluid levels throughout the colon, consistent with adynamic colonic ileus. Normal appendix right lower quadrant. Vascular/Lymphatic: Lymphadenopathy is seen along the stomach, gastric hepatic ligament, and porta hepatis. Index lymph nodes are as follows: Porta hepatis, image 31/3, 18 mm.  Previously 14 mm. Anterior mesenteric, image 29/3, 19 mm.  Previously 17 mm. Retroperitoneal, image 42/3, 15 mm.  Previously 15 mm. 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 Reproductive: Prostate is unremarkable. Other: Trace right upper quadrant ascites. No free intraperitoneal gas. No abdominal wall hernia. Musculoskeletal: No acute or destructive bony abnormalities. Reconstructed images demonstrate no additional findings. IMPRESSION: 1. Large infiltrative mass centered at the gastric cardia, involving the distal esophagus, gastric fundus/body, and superior margin of the pancreas. Overall increase in surrounding mesenteric stranding as well as irregular mural thickening of the stomach consistent with disease progression. 2. Metastatic lymphadenopathy within the retroperitoneum, upper abdomen, and posterior mediastinum, slightly progressive since prior exam. 3. Trace bilateral pleural effusions. 4. Diffuse colonic distension, with oral contrast throughout the colon as well as diffuse gas fluid levels, consistent with adynamic colonic ileus. No evidence of small-bowel obstruction. 5. Stable percutaneous jejunostomy tube. 6. Trace ascites.  Electronically Signed   By: Sharlet Salina M.D.   On: 07/03/2023 15:25   CT HEAD WO CONTRAST ( ) Result Date: 07/03/2023 CLINICAL DATA:  Syncope/presyncope, cerebrovascular cause suspected. EXAM: CT HEAD WITHOUT CONTRAST TECHNIQUE: Contiguous axial images were obtained from the base of the skull through the vertex without intravenous contrast. RADIATION DOSE REDUCTION: This exam was performed according to the departmental dose-optimization program which includes automated exposure control, adjustment of the mA and/or kV according to patient size and/or use of iterative reconstruction technique. COMPARISON:  None Available. FINDINGS: Brain: No acute intracranial hemorrhage. Gray-white differentiation is preserved. No hydrocephalus or extra-axial collection. No mass effect or midline shift. Vascular: No hyperdense vessel or unexpected calcification. Skull: No calvarial fracture or suspicious bone lesion. Skull base is unremarkable. Sinuses/Orbits: No acute finding. Other: None. IMPRESSION: No acute intracranial abnormality. Electronically Signed   By: Orvan Falconer M.D.   On: 07/03/2023 15:10   PERFORMANCE STATUS (ECOG) : 1 - Symptomatic but completely ambulatory  Review of Systems  Constitutional:  Positive  for activity change, appetite change and fatigue.  Gastrointestinal:        J-tube in place. Dressing clean, dry, intact   Musculoskeletal:  Positive for arthralgias.   Unless otherwise noted, a complete review of systems is negative.  Physical Exam General: NAD Cardiovascular: regular rate and rhythm Pulmonary: clear ant fields Abdomen: soft, nontender, + bowel sounds Extremities: no edema, no joint deformities Skin: no rashes Neurological: Alert and oriented x3  IMPRESSION:  This is my initial visit with Mr. Alesi here at the cancer center. Patient initially seen during recent hospitalization for pain management. He is ambulatory. His mother-in-law is present. No acute distress  noted.   I introduced myself, Maygan RN, and Palliative's role in collaboration with the oncology team. Concept of Palliative Care was introduced as specialized medical care for people and their families living with serious illness.  It focuses on providing relief from the symptoms and stress of a serious illness.  The goal is to improve quality of life for both the patient and the family. Values and goals of care important to patient and family were attempted to be elicited.  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 He is currently staying with his daughter, who assists with medication management. The patient has been experiencing difficulty sleeping, but it is unclear if this is related to his pain or another issue. Presents with persistent pain localized to the area of his J-tube insertion and abdomen. The pain, described as sharp and stabbing, has been a constant issue since before the J-tube was placed, but has reportedly worsened since the procedure. He states feelings that his increased pain is due to the combination of J-tube and/or a progression of his underlying condition. States at times he is lying awake tearful bearing the pain. The patient has no history of drug or alcohol misuse and quit smoking cigarettes after finding religion.  The patient is currently managing his pain with Dilaudid and methadone. The methadone, in particular, has been effective in reducing the intensity of the pain, but the patient reports that the pain is still not fully controlled. The Dilaudid is taken every three to four hours as needed, and the patient has been adhering to this regimen unless asleep. There have been no reported issues with constipation or diarrhea. Ismaeel states despite regimen pain intensifies within several hours. Rates worst pain each day exceeding 10 on a 0 to 10 scale. With medication pain decreased to 7-8. Education provided on continuing with current medications however will increase methadone  to 5mg  three times daily while utilizing hydromorphone every 4 hours as needed for breakthrough pain. He and family verbalized understanding.   Complete physical, medication, medical, and psychosocial review completed.  Patient denies any use of illicit drugs or alcohol. Extensive discussions and explanation of palliative's role in collaboration with his oncology team to assist in his pain and symptom management. Education provided pain contract and guidelines for ongoing support including terms for dismissal if contract is broken. Patient and family verbalized understanding. Pain contract completed.    Mr. Schlegel also reports some leakage around the J-tube site, but it is unclear if this is a constant issue or occurs only at certain times or in certain positions. The patient has not yet followed up with the surgeon who performed the J-tube insertion. States recently informed that scheduled visit was not at appropriate location. Advised if needed to follow-up contact Surgeon as instructions to schedule placed on discharged AVS.   All questions answered and  support provided.  We discussed his current illness and what it means in the larger context of his on-going co-morbidities. Natural disease trajectory and expectations were discussed.  Patient and family realistic in their understanding. He is clear in goals to continue to treat the treatable allowing him every opportunity to thrive. Advanced Directives completed and filed. We reviewed in detail. Tresa Endo is his primary Clinical research associate. Mr. Langbehn is now at the point he is ready to discuss further health limitations however Tresa Endo expresses she has approached these discussions cautiously with him.   I discussed the importance of continued conversation with family and their medical providers regarding overall plan of care and treatment options, ensuring decisions are within the context of the patients values and GOCs.  PLAN: Established  therapeutic relationship. Education provided on palliative's role in collaboration with their Oncology/Radiation team.  Cancer-related Pain Persistent sharp, stabbing pain around the area of the J-tube. Pain was present before J-tube placement and has worsened. Current regimen of Methadone and Dilaudid provides some relief but pain is not fully controlled. -Increase Methadone 5mg  to every 8 hours. -Continue Dilaudid 1 tablet every 3-4 hours as needed. -Check in one week to assess response to Methadone adjustment before considering changes to Dilaudid. -Pill box provided to patient to assist in medication management at home.  -Pain Contract completed   J-tube Complications Reports of leakage around the J-tube site. No current signs of wetness or active leakage. -Schedule follow-up appointment with [facility] for J-tube evaluation.  General Health Maintenance -Continue with scheduled chemotherapy treatment. -Check-in during next infusion session to assess response to pain management changes. -Schedule office visit for February 5th.  Patient expressed understanding and was in agreement with this plan. He also understands that He can call the clinic at any time with any questions, concerns, or complaints.   Thank you for your referral and allowing Palliative to assist in Mr. Dionisio David Tomczak's care.   Number and complexity of problems addressed: HIGH - 1 or more chronic illnesses with SEVERE exacerbation, progression, or side effects of treatment - advanced cancer, pain. Any controlled substances utilized were prescribed in the context of palliative care.   Visit consisted of counseling and education dealing with the complex and emotionally intense issues of symptom management and palliative care in the setting of serious and potentially life-threatening illness.  Signed by: Willette Alma, AGPCNP-BC Palliative Medicine Team/[facility]

## 2023-07-18 ENCOUNTER — Other Ambulatory Visit: Payer: Medicaid Other

## 2023-07-18 ENCOUNTER — Inpatient Hospital Stay (HOSPITAL_BASED_OUTPATIENT_CLINIC_OR_DEPARTMENT_OTHER): Payer: Medicaid Other | Admitting: Oncology

## 2023-07-18 ENCOUNTER — Telehealth: Payer: Self-pay | Admitting: *Deleted

## 2023-07-18 ENCOUNTER — Other Ambulatory Visit: Payer: Self-pay

## 2023-07-18 ENCOUNTER — Inpatient Hospital Stay: Payer: Medicaid Other | Attending: Internal Medicine | Admitting: Nurse Practitioner

## 2023-07-18 ENCOUNTER — Inpatient Hospital Stay: Payer: Medicaid Other

## 2023-07-18 ENCOUNTER — Encounter: Payer: Self-pay | Admitting: Oncology

## 2023-07-18 ENCOUNTER — Ambulatory Visit: Payer: Medicaid Other

## 2023-07-18 ENCOUNTER — Encounter: Payer: Self-pay | Admitting: Nurse Practitioner

## 2023-07-18 VITALS — BP 118/79 | HR 95 | Temp 98.8°F | Resp 16 | Wt 175.1 lb

## 2023-07-18 DIAGNOSIS — Z79899 Other long term (current) drug therapy: Secondary | ICD-10-CM | POA: Diagnosis not present

## 2023-07-18 DIAGNOSIS — C160 Malignant neoplasm of cardia: Secondary | ICD-10-CM | POA: Diagnosis not present

## 2023-07-18 DIAGNOSIS — R630 Anorexia: Secondary | ICD-10-CM | POA: Diagnosis not present

## 2023-07-18 DIAGNOSIS — R530 Neoplastic (malignant) related fatigue: Secondary | ICD-10-CM

## 2023-07-18 DIAGNOSIS — G893 Neoplasm related pain (acute) (chronic): Secondary | ICD-10-CM | POA: Diagnosis not present

## 2023-07-18 DIAGNOSIS — Z87891 Personal history of nicotine dependence: Secondary | ICD-10-CM | POA: Diagnosis not present

## 2023-07-18 DIAGNOSIS — Z7189 Other specified counseling: Secondary | ICD-10-CM | POA: Diagnosis not present

## 2023-07-18 DIAGNOSIS — Z5111 Encounter for antineoplastic chemotherapy: Secondary | ICD-10-CM | POA: Diagnosis present

## 2023-07-18 DIAGNOSIS — D500 Iron deficiency anemia secondary to blood loss (chronic): Secondary | ICD-10-CM | POA: Diagnosis not present

## 2023-07-18 DIAGNOSIS — Z515 Encounter for palliative care: Secondary | ICD-10-CM

## 2023-07-18 LAB — CBC WITH DIFFERENTIAL (CANCER CENTER ONLY)
Abs Immature Granulocytes: 0.03 10*3/uL (ref 0.00–0.07)
Basophils Absolute: 0 10*3/uL (ref 0.0–0.1)
Basophils Relative: 0 %
Eosinophils Absolute: 0.5 10*3/uL (ref 0.0–0.5)
Eosinophils Relative: 7 %
HCT: 26.7 % — ABNORMAL LOW (ref 39.0–52.0)
Hemoglobin: 8.5 g/dL — ABNORMAL LOW (ref 13.0–17.0)
Immature Granulocytes: 0 %
Lymphocytes Relative: 23 %
Lymphs Abs: 1.8 10*3/uL (ref 0.7–4.0)
MCH: 25.2 pg — ABNORMAL LOW (ref 26.0–34.0)
MCHC: 31.8 g/dL (ref 30.0–36.0)
MCV: 79.2 fL — ABNORMAL LOW (ref 80.0–100.0)
Monocytes Absolute: 0.8 10*3/uL (ref 0.1–1.0)
Monocytes Relative: 10 %
Neutro Abs: 4.5 10*3/uL (ref 1.7–7.7)
Neutrophils Relative %: 60 %
Platelet Count: 345 10*3/uL (ref 150–400)
RBC: 3.37 MIL/uL — ABNORMAL LOW (ref 4.22–5.81)
RDW: 16.5 % — ABNORMAL HIGH (ref 11.5–15.5)
WBC Count: 7.7 10*3/uL (ref 4.0–10.5)
nRBC: 0 % (ref 0.0–0.2)

## 2023-07-18 LAB — CMP (CANCER CENTER ONLY)
ALT: 20 U/L (ref 0–44)
AST: 20 U/L (ref 15–41)
Albumin: 3.3 g/dL — ABNORMAL LOW (ref 3.5–5.0)
Alkaline Phosphatase: 119 U/L (ref 38–126)
Anion gap: 7 (ref 5–15)
BUN: 9 mg/dL (ref 6–20)
CO2: 27 mmol/L (ref 22–32)
Calcium: 8.9 mg/dL (ref 8.9–10.3)
Chloride: 100 mmol/L (ref 98–111)
Creatinine: 0.67 mg/dL (ref 0.61–1.24)
GFR, Estimated: >60 mL/min (ref >60)
Glucose, Bld: 117 mg/dL — ABNORMAL HIGH (ref 70–99)
Potassium: 4 mmol/L (ref 3.5–5.1)
Sodium: 134 mmol/L — ABNORMAL LOW (ref 135–145)
Total Bilirubin: 0.4 mg/dL (ref 0.0–1.2)
Total Protein: 6.4 g/dL — ABNORMAL LOW (ref 6.5–8.1)

## 2023-07-18 LAB — TSH: TSH: 1.957 u[IU]/mL (ref 0.350–4.500)

## 2023-07-18 MED ORDER — METHADONE HCL 5 MG PO TABS: 5.0000 mg | ORAL_TABLET | Freq: Three times a day (TID) | ORAL | Status: DC

## 2023-07-18 NOTE — Progress Notes (Signed)
Complex Care Management Care Guide Note  07/18/2023 Name: [name] MRN: [id number] DOB: 1975-08-23  Jesus Haynes is a 48 y.o. year old male who is a primary care patient of Ivonne Andrew, NP and is actively engaged with the care management team. I reached out to Jesus Haynes by phone today to assist with re-scheduling  with the BSW.  Follow up plan: spoke with Sharyl Nimrod who says she believes pt has medicaid and resources at this time - contact info given if services are needed in the future   Burman Nieves, CMA, Care Guide [facility], [facility] Guide Direct Dial: [phone]  Fax: [phone] Website: [email or web address]

## 2023-07-18 NOTE — Assessment & Plan Note (Signed)
-   Continue aggressive pain management.  He is currently on Dilaudid and methadone.  We greatly appreciate palliative care team's assistance in his management.

## 2023-07-18 NOTE — Assessment & Plan Note (Signed)
-  Continue to monitor CBCD with each visit and transfuse as needed to maintain hemoglobin closer to 8.

## 2023-07-18 NOTE — Progress Notes (Signed)
[facility]  ONCOLOGY CLINIC PROGRESS NOTE   Patient Care Team: Jesus Andrew, NP as PCP - General (Pulmonary Disease) Clemons, Jesus Haynes Care Management Jesus Haynes, Jesus Patten, MD as Consulting Physician (Oncology) Vida Rigger, MD as Consulting Physician (Gastroenterology) Pickenpack-Cousar, Jesus Baumgartner, NP as Nurse Practitioner [facility])  PATIENT NAME: Jesus Haynes   MR#: [id number] DOB: 1975/09/29  Date of visit: 07/18/2023   ASSESSMENT & PLAN:   Jesus Haynes is a 49 y.o. pleasant gentleman with history of asthma, presented to the ED on 05/27/2023 with complaints of epigastric burning abdominal pain, nausea, retching.  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  Adenocarcinoma of gastroesophageal junction (HCC) -Please review HPI/oncology history for additional details and timeline of events.  -Patient was seen by me during recent hospitalization. When he presented to our clinic to establish care with me on 06/14/23, he was noted to be unable to eat for the last several days and has been feeling dizzy, lightheaded.  Had significant weight loss.  Despite IV hydration in clinic, he continued to remain dizzy and hence we admitted him to the hospital for further evaluation and management.  -Reviewed staging PET/CT findings with the patient previously.  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  Discussed treatment options. All treatment options are palliative in nature and not curative intent. Plan is to proceed with palliative systemic treatment using FOLFOX plus nivolumab.   -He was hospitalized until recently and currently he is at home, reliant on J-tube feeds.  His performance status is slowly  improving.  -His hemoglobin is stable at 8.5 today.  -We will plan to start FOLFOX plus nivolumab from 07/23/2023.  He will receive chemo education prior to that on 07/22/2023.  -I will plan to check labs on 07/25/2023 when he comes for 5-FU pump removal.  I will see him again on 08/01/2023 for toxicity evaluation with repeat labs.  Cancer associated pain - Continue aggressive pain management.  He is currently on Dilaudid and methadone.  We greatly appreciate palliative care team's assistance in his management.  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    I reviewed lab results and outside records for this visit and discussed relevant results with the patient. Diagnosis, plan of care and treatment options were also discussed in detail with the patient. Opportunity provided to ask questions and answers provided to his apparent satisfaction. Provided instructions to call our clinic with any problems, questions or concerns prior to return visit. I recommended to continue follow-up with PCP and sub-specialists. He verbalized understanding and agreed with the plan.   NCCN guidelines have been consulted in the planning of this patient's care.  I spent a total of 40 minutes during this encounter with the patient including review of chart and various tests results, discussions about plan of care and coordination of care plan.   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  INTERVAL HISTORY:    Discussed the use of AI scribe software for clinical note transcription with the patient, who gave verbal consent to proceed.   Jesus Haynes is here today for repeat clinical assessment.   He reports uncontrolled pain despite being on Dilaudid. The pain is not well controlled even with increasing the frequency of the medication to every three hours. The patient's Mother-in-law reports that the pain is not well managed in between doses. The patient also reports a leaking feeding tube, which has been a persistent issue even after receiving new equipment to manage it. The patient was recently discharged from the hospital and has been managing at home with the help of a family member. The patient is scheduled to start chemotherapy and immunotherapy treatments soon.  I have reviewed the past medical history, past surgical history, social history and family history with the patient and they are unchanged from previous note.  HISTORY OF PRESENT ILLNESS:   Oncology History  Adenocarcinoma of gastroesophageal junction (HCC)  05/29/2023 Initial Diagnosis   Adenocarcinoma of gastroesophageal junction (HCC)   07/18/2023 Cancer Staging   Staging form: Esophagus - Adenocarcinoma, AJCC 8th Edition - Clinical: Stage IVB (cT4, cN3, cM1, G3) - Signed by Jesus Crutch, MD on 07/18/2023 Histologic grading system: 3 grade system   07/23/2023 -  Chemotherapy   Patient is on Treatment Plan : GASTROESOPHAGEAL FOLFOX + Nivolumab q14d       48 y.o. gentleman with history of asthma, presented to the ED on 05/27/2023 with complaints of epigastric burning abdominal pain, nausea, retching.  He also reported some dark tarry stools for 3 days prior to arrival.  He was previously seen in the ED at [facility] on 05/08/2023 with complaints of chest or right upper quadrant abdominal pain.  Workup was unremarkable at that time and he was sent home with Protonix and Carafate.  Since his  symptoms persisted, he presented to the ED again.   In the ED, his hemoglobin was noted to be down to 10, compared to 13 previously.  With concern for upper GI bleed, was admitted for further evaluation and management.   Dr. Ewing Schlein performed upper GI endoscopy on 05/28/2023.  It showed partially obstructing, likely malignant esophageal tumor in the lower third of the esophagus.  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  Normal duodenum.  Pathology from GE junction mass came back positive for invasive adenocarcinoma, moderately to poorly differentiated.  Immunostains pending.   CT chest abdomen pelvis on 05/28/2023 showed abnormal wall thickening at the GE junction, compatible with patient's known carcinoma.  Prominent lymph nodes in the retrocrural region on the right side, adjacent to the hiatal hernia.  Abnormal low-density soft tissue in the gastrohepatic region, abutting the lesser curvature and proximal stomach, worrisome for metastatic disease.  Periportal, periceliac, gastrosplenic and left retroperitoneal lymphadenopathy, worrisome for metastatic involvement.  Hypodensities in the liver were also noted, worrisome for metastatic disease, largest 1 measuring 13 mm in the inferior right lobe.  Fat stranding surrounding the body and tail of pancreas, suggesting acute pancreatitis.   We were consulted during his hospitalization on 05/29/2023 for additional recommendations given new diagnosis of GE junction adenocarcinoma.   On 06/07/2023, staging PET/CT showed large hypermetabolic mass involving the distal esophagus and proximal stomach consistent with known primary esophageal carcinoma.  Multiple hypermetabolic retroperitoneal, lower mediastinal lymph  nodes consistent with locally advanced disease.  Suspected direct tumor extension into the pancreatic body.  Hypermetabolic node or peritoneal implant within the omentum.  No other evidence of metastatic disease.   Plan was for palliative systemic  treatments with FOLFOX plus nivolumab.  However patient's performance status continued to decline and he had to be hospitalized again on 06/14/2023.  He finally presented to reestablish care in our clinic on 07/18/2023.  His performance status improved with nutrition via J-tube.  Plan to start treatments from 07/23/2023.    REVIEW OF SYSTEMS:   Review of Systems - Oncology  All other pertinent systems were reviewed with the patient and are negative.  ALLERGIES: He has no known allergies.  MEDICATIONS:  Current Outpatient Medications  Medication Sig Dispense Refill   albuterol (PROVENTIL) (5 MG/ML) 0.5% nebulizer solution Take 0.5 mLs (2.5 mg total) by nebulization every 6 (six) hours as needed for wheezing or shortness of breath. 20 mL 3   albuterol (VENTOLIN HFA) 108 (90 Base) MCG/ACT inhaler Inhale 1-2 puffs into the lungs every 4 (four) hours as needed for wheezing or shortness of breath. 18 g 3   cyanocobalamin 1000 MCG tablet Take 1 tablet (1,000 mcg total) by mouth daily. 30 tablet 2   folic acid (FOLVITE) 1 MG tablet Take 1 tablet (1 mg total) by mouth daily. 30 tablet 2   HYDROmorphone (DILAUDID) 2 MG tablet Take 1 tablet (2 mg total) by mouth every 4 (four) hours as needed for severe pain (pain score 7-10). 60 tablet 0   lidocaine-prilocaine (EMLA) cream Apply to affected area once 30 g 3   meclizine (ANTIVERT) 25 MG tablet Take 1 tablet (25 mg total) by mouth 3 (three) times daily as needed for dizziness. 30 tablet 0   metoCLOPramide (REGLAN) 10 MG tablet Take 1 tablet (10 mg total) by mouth 4 (four) times daily -  before meals and at bedtime. 120 tablet 1   Nutritional Supplements (FEEDING SUPPLEMENT, OSMOLITE 1.5 CAL,) LIQD At 60 mL/h over 18 hours on a daily basis. 30000 mL 0   pantoprazole (PROTONIX) 40 MG tablet Take 1 tablet (40 mg total) by mouth 2 (two) times daily. 60 tablet 1   polyethylene glycol powder (GLYCOLAX/MIRALAX) 17 GM/SCOOP powder Dissolve 17 g in 4 oz of liquid  and take by mouth daily. 476 g 0   potassium chloride (KLOR-CON) 20 MEQ packet Take 20 mEq by mouth daily. 30 packet 0   prochlorperazine (COMPAZINE) 10 MG tablet Take 1 tablet (10 mg total) by mouth every 6 (six) hours as needed for nausea or vomiting. 30 tablet 1   senna-docusate (SENOKOT-S) 8.6-50 MG tablet Take 2 tablets by mouth at bedtime. 60 tablet 2   Water For Irrigation, Sterile (FREE WATER) SOLN Place 100 mLs into feeding tube 4 (four) times daily. 12000 mL 1   dexamethasone (DECADRON) 4 MG tablet Take 2 tablets (8 mg total) by mouth daily. Start the day after chemotherapy for 2 days. Take with food. (Patient not taking: Reported on 07/18/2023) 30 tablet 1   methadone (DOLOPHINE) 5 MG tablet Take 1 tablet (5 mg total) by mouth every 8 (eight) hours.     ondansetron (ZOFRAN) 8 MG tablet Take 1 tablet (8 mg total) by mouth every 8 (eight) hours as needed for nausea or vomiting. Start on the third day after chemotherapy. (Patient not taking: Reported on 07/18/2023) 30 tablet 1   No current facility-administered medications for this visit.     VITALS:  Blood pressure 118/79, pulse 95, temperature 98.8 F (37.1 C), temperature source Temporal, resp. rate 16, weight 175 lb 1.6 oz (79.4 kg), SpO2 94%.  Wt Readings from Last 3 Encounters:  07/18/23 175 lb 1.6 oz (79.4 kg)  07/10/23 178 lb 2.1 oz (80.8 kg)  06/13/23 174 lb (78.9 kg)    Body mass index is 28.26 kg/m.  Performance status (ECOG): 2 - Symptomatic, <50% confined to bed  PHYSICAL EXAM:   Physical Exam Constitutional:      General: He is not in acute distress.    Appearance: Normal appearance.  HENT:     Head: Normocephalic and atraumatic.  Eyes:     General: No scleral icterus.    Conjunctiva/sclera: Conjunctivae normal.  Cardiovascular:     Rate and Rhythm: Normal rate and regular rhythm.     Heart sounds: Normal heart sounds.  Pulmonary:     Effort: Pulmonary effort is normal.     Breath sounds: Normal breath  sounds.  Chest:     Comments: Port-A-Cath in place without signs of infection Abdominal:     General: There is no distension.     Comments: J-tube in place  Musculoskeletal:     Right lower leg: No edema.     Left lower leg: No edema.  Lymphadenopathy:     Cervical: No cervical adenopathy.  Neurological:     General: No focal deficit present.     Mental Status: He is oriented to person, place, and time.  Psychiatric:        Mood and Affect: Mood normal.        Behavior: Behavior normal.      LABORATORY DATA:   I have reviewed the data as listed.  Results for orders placed or performed in visit on 07/18/23  TSH  Result Value Ref Range   TSH 1.957 0.350 - 4.500 uIU/mL  CMP (Cancer Center only)  Result Value Ref Range   Sodium 134 (L) 135 - 145 mmol/L   Potassium 4.0 3.5 - 5.1 mmol/L   Chloride 100 98 - 111 mmol/L   CO2 27 22 - 32 mmol/L   Glucose, Bld 117 (H) 70 - 99 mg/dL   BUN 9 6 - 20 mg/dL   Creatinine 4.09 8.11 - 1.24 mg/dL   Calcium 8.9 8.9 - 91.4 mg/dL   Total Protein 6.4 (L) 6.5 - 8.1 g/dL   Albumin 3.3 (L) 3.5 - 5.0 g/dL   AST 20 15 - 41 U/L   ALT 20 0 - 44 U/L   Alkaline Phosphatase 119 38 - 126 U/L   Total Bilirubin 0.4 0.0 - 1.2 mg/dL   GFR, Estimated >78 >29 mL/min   Anion gap 7 5 - 15  CBC with Differential (Cancer Center Only)  Result Value Ref Range   WBC Count 7.7 4.0 - 10.5 K/uL   RBC 3.37 (L) 4.22 - 5.81 MIL/uL   Hemoglobin 8.5 (L) 13.0 - 17.0 g/dL   HCT 56.2 (L) 13.0 - 86.5 %   MCV 79.2 (L) 80.0 - 100.0 fL   MCH 25.2 (L) 26.0 - 34.0 pg   MCHC 31.8 30.0 - 36.0 g/dL   RDW 78.4 (H) 69.6 - 29.5 %   Platelet Count 345 150 - 400 K/uL   nRBC 0.0 0.0 - 0.2 %   Neutrophils Relative % 60 %   Neutro Abs 4.5 1.7 - 7.7 K/uL   Lymphocytes Relative 23 %   Lymphs Abs 1.8 0.7 - 4.0 K/uL  Monocytes Relative 10 %   Monocytes Absolute 0.8 0.1 - 1.0 K/uL   Eosinophils Relative 7 %   Eosinophils Absolute 0.5 0.0 - 0.5 K/uL   Basophils Relative 0 %    Basophils Absolute 0.0 0.0 - 0.1 K/uL   Immature Granulocytes 0 %   Abs Immature Granulocytes 0.03 0.00 - 0.07 K/uL     RADIOGRAPHIC STUDIES:  I have personally reviewed the radiological images as listed and agree with the findings in the report.  CT ABDOMEN PELVIS W CONTRAST Result Date: 07/03/2023 CLINICAL DATA:  Bowel obstruction, colonic ileus on previous x-ray, history of adenocarcinoma at the gastroesophageal junction EXAM: CT ABDOMEN AND PELVIS WITH CONTRAST TECHNIQUE: Multidetector CT imaging of the abdomen and pelvis was performed using the standard protocol following bolus administration of intravenous contrast. RADIATION DOSE REDUCTION: This exam was performed according to the departmental dose-optimization program which includes automated exposure control, adjustment of the mA and/or kV according to patient size and/or use of iterative reconstruction technique. CONTRAST:  OMNIPAQUE IOHEXOL 300 MG/ML  SOLN COMPARISON:  06/26/2023, 06/07/2023 FINDINGS: Lower chest: Trace bilateral pleural effusions with dependent lower lobe atelectasis. Infiltrative soft tissue mass involving the distal esophagus and proximal stomach is again noted. Posterior mediastinal adenopathy measuring up to 12 mm consistent with metastatic disease. Hepatobiliary: Stable hepatic cysts. Otherwise unremarkable appearance of the liver and gallbladder. Pancreas: The infiltrative mass at the gastroesophageal junction involves the superior margin of the pancreatic body, similar to prior exam. This area measures approximately 5.2 x 4.2 cm reference image 32/3. No pancreatic duct dilation. Spleen: Normal in size without focal abnormality. Adrenals/Urinary Tract: Adrenal glands are unremarkable. Kidneys are normal, without renal calculi, focal lesion, or hydronephrosis. Bladder is unremarkable. Stomach/Bowel: Infiltrative mass involving the distal esophagus, gastric cardia, and gastric fundus is again identified, consistent  with known history of malignancy. There is increased surrounding mesenteric stranding, as well is progressive irregular mural thickening of the gastric fundus and body. There is a percutaneous jejunostomy catheter in stable position. No evidence of small-bowel obstruction. Continued diffuse colonic distension with oral contrast and gas fluid levels throughout the colon, consistent with adynamic colonic ileus. Normal appendix right lower quadrant. Vascular/Lymphatic: Lymphadenopathy is seen along the stomach, gastric hepatic ligament, and porta hepatis. Index lymph nodes are as follows: Porta hepatis, image 31/3, 18 mm.  Previously 14 mm. Anterior mesenteric, image 29/3, 19 mm.  Previously 17 mm. Retroperitoneal, image 42/3, 15 mm.  Previously 15 mm. 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 Reproductive: Prostate is unremarkable. Other: Trace right upper quadrant ascites. No free intraperitoneal gas. No abdominal wall hernia. Musculoskeletal: No acute or destructive bony abnormalities. Reconstructed images demonstrate no additional findings. IMPRESSION: 1. Large infiltrative mass centered at the gastric cardia, involving the distal esophagus, gastric fundus/body, and superior margin of the pancreas. Overall increase in surrounding mesenteric stranding as well as irregular mural thickening of the stomach consistent with disease progression. 2. Metastatic lymphadenopathy within the retroperitoneum, upper abdomen, and posterior mediastinum, slightly progressive since prior exam. 3. Trace bilateral pleural effusions. 4. Diffuse colonic distension, with oral contrast throughout the colon as well as diffuse gas fluid levels, consistent with adynamic colonic ileus. No evidence of small-bowel obstruction. 5. Stable percutaneous jejunostomy tube. 6. Trace ascites. Electronically Signed   By: Sharlet Salina M.D.   On: 07/03/2023 15:25   CT HEAD WO CONTRAST  ( ) Result Date: 07/03/2023 CLINICAL DATA:  Syncope/presyncope, cerebrovascular cause suspected. EXAM: CT  HEAD WITHOUT CONTRAST TECHNIQUE: Contiguous axial images were obtained from the base of the skull through the vertex without intravenous contrast. RADIATION DOSE REDUCTION: This exam was performed according to the departmental dose-optimization program which includes automated exposure control, adjustment of the mA and/or kV according to patient size and/or use of iterative reconstruction technique. COMPARISON:  None Available. FINDINGS: Brain: No acute intracranial hemorrhage. Gray-white differentiation is preserved. No hydrocephalus or extra-axial collection. No mass effect or midline shift. Vascular: No hyperdense vessel or unexpected calcification. Skull: No calvarial fracture or suspicious bone lesion. Skull base is unremarkable. Sinuses/Orbits: No acute finding. Other: None. IMPRESSION: No acute intracranial abnormality. Electronically Signed   By: Orvan Falconer M.D.   On: 07/03/2023 15:10   DG Abd 2 Views Result Date: 07/01/2023 CLINICAL DATA:  Ileus. EXAM: ABDOMEN - 2 VIEW COMPARISON:  Abdominal x-ray dated June 29, 2023. FINDINGS: Jejunostomy tube again noted. Unchanged gaseous distention of the colon with air-fluid levels. Interval partial clearing of the colonic oral contrast. No obvious small bowel dilatation. No acute osseous abnormality. IMPRESSION: 1. Unchanged colonic ileus. Electronically Signed   By: Obie Dredge M.D.   On: 07/01/2023 14:08   DG Abd 2 Views Result Date: 06/29/2023 CLINICAL DATA:  49 year old male with history of ileus. EXAM: ABDOMEN - 2 VIEW COMPARISON:  Abdominal radiograph 06/24/2023. FINDINGS: Oral contrast material is noted in the colon and rectum. Multiple air-fluid levels are noted on the upright projection, however, these appear to be colonic. No definite pathologic dilatation of small bowel noted. No pneumoperitoneum. Percutaneous gastrostomy tube  noted projecting over the left side of the abdomen. IMPRESSION: 1. Nonobstructive bowel gas pattern. 2. No pneumoperitoneum. Electronically Signed   By: Trudie Reed M.D.   On: 06/29/2023 08:56   CT ABDOMEN PELVIS W CONTRAST Result Date: 06/26/2023 CLINICAL DATA:  History of gastroesophageal junction tumor with acute abdominal pain status post J-tube placement for failure to thrive. * Tracking Code: BO * EXAM: CT ABDOMEN AND PELVIS WITH CONTRAST TECHNIQUE: Multidetector CT imaging of the abdomen and pelvis was performed using the standard protocol following bolus administration of intravenous contrast. RADIATION DOSE REDUCTION: This exam was performed according to the departmental dose-optimization program which includes automated exposure control, adjustment of the mA and/or kV according to patient size and/or use of iterative reconstruction technique. CONTRAST:  OMNIPAQUE IOHEXOL 300 MG/ML  SOLN COMPARISON:  Nuclear medicine PET dated 06/07/2023, CT abdomen and pelvis dated 05/28/2023 FINDINGS: Lower chest: Bilateral lower lobe subsegmental atelectasis. New trace right pleural effusion. Partially imaged heart size is normal. Hepatobiliary: Scattered hepatic hypodensities measuring up to 12 mm in segment 5/6 (2:28), likely cysts. No intra or extrahepatic biliary ductal dilation. Normal gallbladder. Pancreas: Diffuse thickening of the pancreas is again seen with heterogeneous enhancement, inseparable from hypoenhancing mass extending inferiorly from the gastric cardia into the pancreatic neck/body. In conglomerate, this area measures approximately 5.4 x 4.6 cm (2:26), previously 4.8 by 3.6 cm. Spleen: Normal in size without focal abnormality. Adrenals/Urinary Tract: No adrenal nodules. No suspicious renal mass, calculi or hydronephrosis. No focal bladder wall thickening. Stomach/Bowel: Infiltrative soft tissue mass of the gastroesophageal junction in keeping with known malignancy. With tip terminating  in the right upper quadrant. The small bowel along the jejunostomy catheter is decompressed. Enteric contrast material is present throughout the colon. The ascending colon is diffusely dilated. Normal appendix. 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 Aortic atherosclerosis. Multi station upper abdominal and retroperitoneal lymphadenopathy is again seen. A  few lymph nodes are markedly increased in size, for example 14 mm periportal (2:25), previously 7 mm and 17 mm anterior midline mesenteric (2:22), previously 11 mm, and others similar to minimally increased in size, for example for example 12 mm paraesophageal (2:12), previously 10 mm and 10 mm right retrocrural (2:23), unchanged. Reproductive: Prostate is unremarkable. Other: Trace ascites.  No free air or fluid collection. Musculoskeletal: No acute or abnormal lytic or blastic osseous lesions. Small fat-containing paraumbilical and bilateral inguinal hernias. IMPRESSION: 1. Infiltrative soft tissue mass of the gastroesophageal junction in keeping with known malignancy with interval increase in size of hypoenhancing mass extending inferiorly from the gastric cardia into the pancreatic neck/body. 2. Interval placement of jejunostomy catheter with tip terminating in the right upper quadrant. The small bowel along the jejunostomy catheter is decompressed and not well evaluated. The ascending colon is diffusely dilated, likely ileus. 3. Interval increase in size of multi station upper abdominal and retroperitoneal lymphadenopathy, in keeping with metastatic disease. 4. New trace right pleural effusion. 5. Aortic Atherosclerosis (ICD10-I70.0). Electronically Signed   By: Agustin Cree M.D.   On: 06/26/2023 18:01   DG Abd Portable 1V-Small Bowel Obstruction Protocol-initial, 8 hr delay Result Date: 06/25/2023 CLINICAL DATA:  8 hour delay for small bowel obstruction EXAM: PORTABLE ABDOMEN - 1 VIEW COMPARISON:  06/21/2023  FINDINGS: On the 8 hour image, there is contrast throughout the colon into the rectum. Continued gaseous distention of the colon. No small bowel dilatation. Change in ostomy catheter again noted in the left abdomen. IMPRESSION: Contrast throughout the colon into the rectum. No evidence of small bowel obstruction. Electronically Signed   By: Charlett Nose M.D.   On: 06/25/2023 00:16   DG Abd 1 View Result Date: 06/21/2023 CLINICAL DATA:  Constipation EXAM: ABDOMEN - 1 VIEW COMPARISON:  None Available. FINDINGS: Gas-filled, distended colon, without large burden of stool and gas present to the rectum. Percutaneous enterostomy tube projects over the left hemiabdomen. No free air in the abdomen on supine radiographs. IMPRESSION: 1. Gas-filled, distended colon, without large burden of stool and gas present to the rectum. 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 2. Percutaneous enterostomy tube projects over the left hemiabdomen. Electronically Signed   By: Jearld Lesch M.D.   On: 06/21/2023 17:31    CODE STATUS:  Code Status History     Date Active Date Inactive Code Status Order ID Comments User Context   06/14/2023 1621 07/11/2023 2225 Full Code [id number]  Bobette Mo, MD Inpatient   06/13/2023 1253 06/14/2023 0509 Full Code [id number]  Richarda Overlie, MD HOV   05/27/2023 1516 05/30/2023 1651 Full Code [id number]  Maryln Gottron, MD ED    Questions for Most Recent Historical Code Status (Order [id number])     Question Answer   By: Consent: discussion documented in EHR            Orders Placed This Encounter  Procedures   CBC with Differential (Cancer Center Only)    Standing Status:   Future    Expected Date:   08/21/2023    Expiration Date:   08/20/2024   CMP (Cancer Center only)    Standing Status:   Future    Expected Date:   08/21/2023    Expiration Date:   08/20/2024   T4    Standing Status:   Future    Expected Date:   08/21/2023    Expiration  Date:   08/20/2024   TSH  Standing Status:   Future    Expected Date:   08/21/2023    Expiration Date:   08/20/2024     Future Appointments  Date Time Provider Department Center  07/22/2023 11:00 AM [facility] CHEMO EDU [facility] None  07/23/2023  7:30 AM [facility] INFUSION [facility] None  07/23/2023  9:30 AM [facility] PALLIATIVE CARE [facility] None  07/25/2023 10:30 AM [facility] FLUSH [facility] None  07/25/2023 11:00 AM [facility] FLUSH [facility] None  08/01/2023  2:15 PM [facility] FLUSH [facility] None  08/01/2023  2:40 PM Shellia Hartl, MD [facility] None  08/07/2023  8:30 AM [facility] FLUSH [facility] None  08/07/2023  9:00 AM Lindley Hiney, MD [facility] None  08/07/2023 10:00 AM [facility] INFUSION [facility] None  08/09/2023  1:15 PM [facility] FLUSH [facility] None      This document was completed utilizing speech recognition software. Grammatical errors, random word insertions, pronoun errors, and incomplete sentences are an occasional consequence of this system due to software limitations, ambient noise, and hardware issues. Any formal questions or concerns about the content, text or information contained within the body of this dictation should be directly addressed to the provider for clarification.

## 2023-07-18 NOTE — Assessment & Plan Note (Signed)
-  Please review HPI/oncology history for additional details and timeline of events.  -Patient was seen by me during recent hospitalization. When he presented to our clinic to establish care with me on 06/14/23, he was noted to be unable to eat for the last several days and has been feeling dizzy, lightheaded.  Had significant weight loss.  Despite IV hydration in clinic, he continued to remain dizzy and hence we admitted him to the hospital for further evaluation and management.  -Reviewed staging PET/CT findings with the patient previously.  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  Discussed treatment options. All treatment options are palliative in nature and not curative intent. Plan is to proceed with palliative systemic treatment using FOLFOX plus nivolumab.   -He was hospitalized until recently and currently he is at home, reliant on J-tube feeds.  His performance status is slowly improving.  -His hemoglobin is stable at 8.5 today.  -We will plan to start FOLFOX plus nivolumab from 07/23/2023.  He will receive chemo education prior to that on 07/22/2023.  -I will plan to check labs on 07/25/2023 when he comes for 5-FU pump removal.  I will see him again on 08/01/2023 for toxicity evaluation with repeat labs.

## 2023-07-19 ENCOUNTER — Encounter: Payer: Self-pay | Admitting: Oncology

## 2023-07-19 ENCOUNTER — Other Ambulatory Visit (HOSPITAL_COMMUNITY): Payer: Self-pay

## 2023-07-19 ENCOUNTER — Other Ambulatory Visit: Payer: Self-pay

## 2023-07-19 ENCOUNTER — Other Ambulatory Visit: Payer: Self-pay | Admitting: Nurse Practitioner

## 2023-07-19 DIAGNOSIS — Z515 Encounter for palliative care: Secondary | ICD-10-CM

## 2023-07-19 DIAGNOSIS — G893 Neoplasm related pain (acute) (chronic): Secondary | ICD-10-CM

## 2023-07-19 DIAGNOSIS — C160 Malignant neoplasm of cardia: Secondary | ICD-10-CM

## 2023-07-19 LAB — T4: T4, Total: 7.7 ug/dL (ref 4.5–12.0)

## 2023-07-19 MED ORDER — HYDROMORPHONE HCL 2 MG PO TABS
2.0000 mg | ORAL_TABLET | ORAL | 0 refills | Status: DC | PRN
  Filled 2023-07-19 – 2023-07-22 (×2): qty 90, 15d supply, fill #0

## 2023-07-20 ENCOUNTER — Other Ambulatory Visit: Payer: Self-pay

## 2023-07-20 ENCOUNTER — Encounter (HOSPITAL_COMMUNITY): Payer: Self-pay | Admitting: Emergency Medicine

## 2023-07-20 ENCOUNTER — Emergency Department (HOSPITAL_COMMUNITY)
Admission: EM | Admit: 2023-07-20 | Discharge: 2023-07-20 | Disposition: A | Discharge status: Home / Self Care | Payer: Medicaid Other | Attending: Emergency Medicine | Admitting: Emergency Medicine

## 2023-07-20 DIAGNOSIS — R109 Unspecified abdominal pain: Secondary | ICD-10-CM | POA: Diagnosis present

## 2023-07-20 DIAGNOSIS — R1084 Generalized abdominal pain: Secondary | ICD-10-CM | POA: Diagnosis not present

## 2023-07-20 MED ORDER — HYDROMORPHONE HCL 2 MG/ML IJ SOLN
2.0000 mg | Freq: Once | INTRAMUSCULAR | Status: AC
  Administered 2023-07-20: 2 mg via INTRAMUSCULAR
  Filled 2023-07-20: qty 1

## 2023-07-20 MED ORDER — HYDROMORPHONE HCL 2 MG PO TABS: 2.0000 mg | ORAL_TABLET | ORAL | 0 refills | Status: DC | PRN

## 2023-07-20 NOTE — ED Provider Notes (Signed)
EMERGENCY DEPARTMENT AT [facility] Provider Note   CSN: [id number] Arrival date & time: 07/20/23  2039     History  Chief Complaint  Patient presents with   Abdominal Pain    Jesus Haynes is a 48 y.o. male.  48 year old male with prior medical history as detailed below presents for abdominal pain.  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  Patient reports that within the last several days his baseline methadone 5 mg was changed to 3 times daily dosing.  He is advised to use Dilaudid 2 mg tablets as needed for breakthrough.  Patient reports that he has run out of his Dilaudid tonight.  He reports that his pain is the same as when he has been experiencing for several weeks now.  He denies fever.  He denies chest pain or shortness of breath.  The history is provided by the patient and medical records.       Home Medications Prior to Admission medications   Medication Sig Start Date End Date Taking? Authorizing Provider  albuterol (PROVENTIL) (5 MG/ML) 0.5% nebulizer solution Take 0.5 mLs (2.5 mg total) by nebulization every 6 (six) hours as needed for wheezing or shortness of breath. 04/28/23   Roxy Horseman, PA-C  albuterol (VENTOLIN HFA) 108 (90 Base) MCG/ACT inhaler Inhale 1-2 puffs into the lungs every 4 (four) hours as needed for wheezing or shortness of breath. 06/20/22   Mare Ferrari, PA-C  cyanocobalamin 1000 MCG tablet Take 1 tablet (1,000 mcg total) by mouth daily. 07/10/23   Osvaldo Shipper, MD  dexamethasone (DECADRON) 4 MG tablet Take 2 tablets (8 mg total) by mouth daily. Start the day after chemotherapy for 2 days. Take with food. Patient not taking: Reported on 07/18/2023 07/11/23   Pasam, Archie Patten, MD  folic acid (FOLVITE) 1 MG tablet Take 1 tablet (1 mg total) by mouth daily. 07/10/23   Osvaldo Shipper, MD  HYDROmorphone (DILAUDID) 2 MG tablet Take 1 tablet (2 mg  total) by mouth every 4 (four) hours as needed for severe pain (pain score 7-10). 07/19/23   Pickenpack-Cousar, Arty Baumgartner, NP  lidocaine-prilocaine (EMLA) cream Apply to affected area once 07/11/23   Pasam, Avinash, MD  meclizine (ANTIVERT) 25 MG tablet Take 1 tablet (25 mg total) by mouth 3 (three) times daily as needed for dizziness. 07/10/23   Osvaldo Shipper, MD  methadone (DOLOPHINE) 5 MG tablet Take 1 tablet (5 mg total) by mouth every 8 (eight) hours. 07/18/23   Pickenpack-Cousar, Arty Baumgartner, NP  metoCLOPramide (REGLAN) 10 MG tablet Take 1 tablet (10 mg total) by mouth 4 (four) times daily -  before meals and at bedtime. 07/10/23   Osvaldo Shipper, MD  Nutritional Supplements (FEEDING SUPPLEMENT, OSMOLITE 1.5 CAL,) LIQD At 60 mL/h over 18 hours on a daily basis. 07/10/23   Osvaldo Shipper, MD  ondansetron (ZOFRAN) 8 MG tablet Take 1 tablet (8 mg total) by mouth every 8 (eight) hours as needed for nausea or vomiting. Start on the third day after chemotherapy. Patient not taking: Reported on 07/18/2023 07/11/23   Pasam, Archie Patten, MD  pantoprazole (PROTONIX) 40 MG tablet Take 1 tablet (40 mg total) by mouth 2 (two) times daily. 07/10/23 09/08/23  Osvaldo Shipper, MD  polyethylene glycol powder (GLYCOLAX/MIRALAX) 17 GM/SCOOP powder Dissolve 17 g in 4 oz of liquid and take by mouth daily. 07/10/23   Osvaldo Shipper, MD  potassium chloride (KLOR-CON) 20 MEQ packet Take 20  mEq by mouth daily. 07/10/23   Osvaldo Shipper, MD  prochlorperazine (COMPAZINE) 10 MG tablet Take 1 tablet (10 mg total) by mouth every 6 (six) hours as needed for nausea or vomiting. 07/11/23   Pasam, Archie Patten, MD  senna-docusate (SENOKOT-S) 8.6-50 MG tablet Take 2 tablets by mouth at bedtime. 07/10/23   Osvaldo Shipper, MD  Water For Irrigation, Sterile (FREE WATER) SOLN Place 100 mLs into feeding tube 4 (four) times daily. 07/10/23   Osvaldo Shipper, MD  cetirizine (ZYRTEC) 10 MG tablet Take 1 tablet (10 mg total) by mouth daily. Patient not taking: Reported  on 01/26/2019 02/23/18 07/26/20  Maczis, Elmer Sow, PA-C  fluticasone [facility]) 50 MCG/ACT nasal spray Place 1-2 sprays into both nostrils daily. Patient not taking: Reported on 01/26/2019 02/23/18 07/26/20  Jacinto Halim, PA-C      Allergies    Patient has no known allergies.    Review of Systems   Review of Systems  All other systems reviewed and are negative.   Physical Exam Updated Vital Signs BP 113/74 (BP Location: Left Arm)   Pulse 98   Temp 98.5 F (36.9 C) (Oral)   Resp 16   Ht 5\' 6"  (1.676 m)   Wt 79.4 kg   SpO2 95%   BMI 28.25 kg/m  Physical Exam Vitals and nursing note reviewed.  Constitutional:      General: He is not in acute distress.    Appearance: Normal appearance. He is well-developed.  HENT:     Head: Normocephalic and atraumatic.  Eyes:     Conjunctiva/sclera: Conjunctivae normal.     Pupils: Pupils are equal, round, and reactive to light.  Cardiovascular:     Rate and Rhythm: Normal rate and regular rhythm.     Heart sounds: Normal heart sounds.  Pulmonary:     Effort: Pulmonary effort is normal. No respiratory distress.     Breath sounds: Normal breath sounds.  Abdominal:     General: There is no distension.     Palpations: Abdomen is soft.     Tenderness: There is no abdominal tenderness.     Comments: Feeding tube in place.  Patient localizes his pain to his epigastrium  Musculoskeletal:        General: No deformity. Normal range of motion.     Cervical back: Normal range of motion and neck supple.  Skin:    General: Skin is warm and dry.  Neurological:     General: No focal deficit present.     Mental Status: He is alert and oriented to person, place, and time.     ED Results / Procedures / Treatments   Labs (all labs ordered are listed, but only abnormal results are displayed) Labs Reviewed - No data to display  EKG None  Radiology No results found.  Procedures Procedures    Medications Ordered in ED Medications - No  data to display  ED Course/ Medical Decision Making/ A&P                                 Medical Decision Making Risk Prescription drug management.    Medical Screen Complete  This patient presented to the ED with complaint of abdominal pain.  This complaint involves an extensive number of treatment options. The initial differential diagnosis includes, but is not limited to, breakthrough abdominal pain  This presentation is: Chronic, Self-Limited, Previously Undiagnosed, Uncertain Prognosis, and Complicated  Patient is presenting with acute on chronic abdominal pain.  Patient with known history of adenocarcinoma of the gastroesophageal junction.  Patient with chronic abdominal pain issues related to same.  Patient has been on methadone which was recently increased to 5 mg 3 times daily.  He presents tonight after apparently running out of his Dilaudid tablets which he was using for breakthrough pain.  Patient declined additional workup including IV, labs, CT imaging.  Patient feels much improved after 2 mg of Dilaudid IM.  He desires discharge.  Patient is aware that he has a prescription for Dilaudid tablets at the [facility].  It is unclear when this will be ready for pickup.  Additional small quantity of Dilaudid tablets (2 mg, total of 12 tablets) electronically sent to the [facility] at [facility].  Patient is advised to closely follow-up with his outpatient care providers for additional pain management.   Additional history obtained:  External records from outside sources obtained and reviewed including prior ED visits and prior Inpatient records.    Problem List / ED Course:  Chronic abdominal pain   Reevaluation:  After the interventions noted above, I reevaluated the patient and found that they have: improved  Disposition:  After consideration of the diagnostic results and the patients response to treatment, I feel that the patent would benefit from  close outpatient follow-up..          Final Clinical Impression(s) / ED Diagnoses Final diagnoses:  Generalized abdominal pain    Rx / DC Orders ED Discharge Orders     None         Wynetta Fines, MD 07/20/23 2237

## 2023-07-20 NOTE — ED Triage Notes (Signed)
Pt reports he is a cancer pt and is experiencing severe abd pain, reports he had a change in meds yesterday but instructions say to adjust the dose after chemo which does not start until next week

## 2023-07-20 NOTE — Discharge Instructions (Signed)
Return for any problem.  Appears that you have a prescription for Dilaudid available for pickup at the [facility].  Please check with the [facility] tomorrow to see if you can obtain this medication.

## 2023-07-22 ENCOUNTER — Encounter: Payer: Self-pay | Admitting: Oncology

## 2023-07-22 ENCOUNTER — Other Ambulatory Visit (HOSPITAL_COMMUNITY): Payer: Self-pay

## 2023-07-22 ENCOUNTER — Other Ambulatory Visit: Payer: Self-pay | Admitting: Oncology

## 2023-07-22 ENCOUNTER — Inpatient Hospital Stay: Payer: Medicaid Other

## 2023-07-22 NOTE — Progress Notes (Unsigned)
Palliative [facility]  [phone] [phone]   Name: Jesus Haynes Date: 07/22/2023 MRN: [id number]  DOB: 1975-12-24  Patient Care Team: Ivonne Andrew, NP as PCP - General (Pulmonary Disease) Ripley Fraise Edmund Hilda Care Management Pasam, Archie Patten, MD as Consulting Physician (Oncology) Vida Rigger, MD as Consulting Physician (Gastroenterology) Pickenpack-Cousar, Arty Baumgartner, NP as Nurse Practitioner (Hospice and Palliative Medicine)    INTERVAL HISTORY: Jesus Haynes is a 48 y.o. 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 Palliative ask to see for symptom management and goals of care.   SOCIAL HISTORY:     reports that he quit smoking about 10 years ago. His smoking use included cigarettes. He has never used smokeless tobacco. He reports that he does not drink alcohol and does not use drugs.  ADVANCE DIRECTIVES:  Advanced directives on file naming Jesus Haynes as the primary and Jesus Haynes as the secondary decision makers should Jesus Haynes become unable to make decisions for himself.   CODE STATUS: Full code  PAST MEDICAL HISTORY: Past Medical History:  Diagnosis Date   Asthma     ALLERGIES:  has no known allergies.  MEDICATIONS:  Current Outpatient Medications  Medication Sig Dispense Refill   albuterol (PROVENTIL) (5 MG/ML) 0.5% nebulizer solution Take 0.5 mLs (2.5 mg total) by nebulization every 6 (six) hours as needed for wheezing or shortness of breath. 20 mL 3   albuterol (VENTOLIN HFA) 108 (90 Base) MCG/ACT inhaler Inhale 1-2 puffs into the lungs every 4 (four) hours as needed for wheezing or shortness of breath. 18 g 3   cyanocobalamin 1000 MCG tablet Take 1 tablet (1,000 mcg total) by mouth daily. 30 tablet 2   dexamethasone (DECADRON) 4 MG tablet Take 2 tablets (8 mg total) by  mouth daily. Start the day after chemotherapy for 2 days. Take with food. (Patient not taking: Reported on 07/18/2023) 30 tablet 1   folic acid (FOLVITE) 1 MG tablet Take 1 tablet (1 mg total) by mouth daily. 30 tablet 2   HYDROmorphone (DILAUDID) 2 MG tablet Take 1 tablet (2 mg total) by mouth every 4 (four) hours as needed for severe pain (pain score 7-10). 90 tablet 0   HYDROmorphone (DILAUDID) 2 MG tablet Take 1 tablet (2 mg total) by mouth every 4 (four) hours as needed for severe pain (pain score 7-10). 12 tablet 0   lidocaine-prilocaine (EMLA) cream Apply to affected area once 30 g 3   meclizine (ANTIVERT) 25 MG tablet Take 1 tablet (25 mg total) by mouth 3 (three) times daily as needed for dizziness. 30 tablet 0   methadone (DOLOPHINE) 5 MG tablet Take 1 tablet (5 mg total) by mouth every 8 (eight) hours.     metoCLOPramide (REGLAN) 10 MG tablet Take 1 tablet (10 mg total) by mouth 4 (four) times daily -  before meals and at bedtime. 120 tablet 1   Nutritional Supplements (FEEDING SUPPLEMENT, OSMOLITE 1.5 CAL,) LIQD At 60 mL/h over 18 hours on a daily basis. 30000 mL 0   ondansetron (ZOFRAN) 8 MG tablet Take 1 tablet (8 mg total) by mouth every 8 (eight) hours as needed for nausea or vomiting. Start on the third day after chemotherapy. (Patient not taking: Reported on 07/18/2023) 30 tablet 1   pantoprazole (PROTONIX) 40 MG tablet Take 1 tablet (40 mg total) by mouth 2 (two) times daily.  60 tablet 1   polyethylene glycol powder (GLYCOLAX/MIRALAX) 17 GM/SCOOP powder Dissolve 17 g in 4 oz of liquid and take by mouth daily. 476 g 0   potassium chloride (KLOR-CON) 20 MEQ packet Take 20 mEq by mouth daily. 30 packet 0   prochlorperazine (COMPAZINE) 10 MG tablet Take 1 tablet (10 mg total) by mouth every 6 (six) hours as needed for nausea or vomiting. 30 tablet 1   senna-docusate (SENOKOT-S) 8.6-50 MG tablet Take 2 tablets by mouth at bedtime. 60 tablet 2   Water For Irrigation, Sterile (FREE WATER)  SOLN Place 100 mLs into feeding tube 4 (four) times daily. 12000 mL 1   No current facility-administered medications for this visit.    VITAL SIGNS: There were no vitals taken for this visit. There were no vitals filed for this visit.  Estimated body mass index is 28.25 kg/m as calculated from the following:   Height as of 07/20/23: 5\' 6"  (1.676 m).   Weight as of 07/20/23: 175 lb (79.4 kg).  Drugs of Abuse     Component Value Date/Time   LABOPIA POSITIVE (A) 07/23/2023 1036   COCAINSCRNUR NONE DETECTED 07/23/2023 1036   LABBENZ POSITIVE (A) 07/23/2023 1036   AMPHETMU NONE DETECTED 07/23/2023 1036   THCU NONE DETECTED 07/23/2023 1036   LABBARB NONE DETECTED 07/23/2023 1036     PERFORMANCE STATUS (ECOG) : 1 - Symptomatic but completely ambulatory   Physical Exam General: NAD Cardiovascular: regular rate and rhythm Pulmonary: clear ant fields Abdomen: soft, nontender, + bowel sounds Extremities: no edema, no joint deformities Skin: no rashes Neurological: AAO x3  IMPRESSION:  I saw Jesus Haynes during infusion. No family present. Patient is aware and alert able to engage appropriately in discussions however appears somewhat sedated. His eyes are open during my visit. He appeared restless sitting back in chair then on edge of the chair. I advised patient for safety he should sit back in the chair. He expressed it was uncomfortable. Offered to elevate feet or assist in ways to reposition given he is sitting on edge and leaning forward. Jesus Haynes states "I promise I won't fall, just let me sit like this". He reports feeling "on edge" and unable to find a comfortable position, even when elevating his feet. The patient denies any specific pain but describes a pervasive sense of discomfort.  He is receiving initial treatment today. Denies nausea, vomiting, constipation, or diarrhea. Complains of generalized discomfort and stomach pain which he reports is constant despite pain regimen. Recent  ED visit due to abdominal pain and running out of his hydromorphone. Prescription was sent to pharmacy however was not available for pick-up. We discussed his regimen at length. He is taking methadone, taken three times a day for pain management and dilaudid 2mg  every 4 hours. Given his level of sedation concerned on frequency and amount of medications patient be taking.  He expresses concern about reducing the dosage, fearing an increase in pain levels given pain is challenging on current regimen. The patient also reports difficulty sleeping, often missing doses of his medication due to sleep disruption. He reports he is taking hydromorphone at least 3-4 times daily for breakthrough. The patient's daughter currently manages his medication and pill box.   The patient's medication management and refill process will be discussed further with his daughter. All questions answered and support provided.  I discussed the importance of continued conversation with family and their medical providers regarding overall plan of care and treatment options, ensuring decisions  are within the context of the patients values and GOCs.  Assessment and Plan  Cancer with Chemotherapy   Patient reports discomfort and pain, particularly in the stomach. Currently undergoing chemotherapy.   -Ensure patient's comfort during chemotherapy.   -Patient aware he will need to review home medications with nursing staff before administration. Advised not to take anything prior to notifying the nurse.   Pain Management   Patient reports significant pain and is on Methadone three times a day. Appears groggy although he is awake, alert, and does not close eyes during discussions, still some concern suggesting possible overmedication.   -Consider reviewing and adjusting Methadone dosage to balance pain management and side effects.   -Ensure patient's daughter is correctly managing medication in pill box at home compared to how patient is  reporting taking.  -Decrease methadone to twice daily. May need to consider other forms of pain medication over time if he continues to demonstrate sedation behaviors.  -UDS negative -Hydromorphone 2mg  every 6 hours as needed for pain.   Medication Compliance   Recent ER visit due to running out of medication. Prescription was available at the pharmacy, suggesting a misunderstanding or lack of communication.   -Communicate with patient's daughter regarding medication refills and proper administration.   -Ensure patient's understanding of medication regimen.    Urinary Function   No reported issues with urination.   -Collect urine sample for routine analysis.    Family Support   Patient's daughter is in labor and patient's mother-in-law is with her. Patient lives with another daughter who manages his medication.   -Ensure continuity of care and medication management during this family event.  Patient expressed understanding and was in agreement with this plan. He also understands that He can call the clinic at any time with any questions, concerns, or complaints.   Any controlled substances utilized were prescribed in the context of palliative care. PDMP has been reviewed.   Visit consisted of counseling and education dealing with the complex and emotionally intense issues of symptom management and palliative care in the setting of serious and potentially life-threatening illness.  Willette Alma, AGPCNP-BC  Palliative Medicine Team/[facility]

## 2023-07-23 ENCOUNTER — Inpatient Hospital Stay: Payer: Medicaid Other

## 2023-07-23 ENCOUNTER — Other Ambulatory Visit: Payer: Self-pay

## 2023-07-23 ENCOUNTER — Inpatient Hospital Stay: Payer: Medicaid Other | Admitting: Dietician

## 2023-07-23 ENCOUNTER — Telehealth: Payer: Self-pay

## 2023-07-23 ENCOUNTER — Inpatient Hospital Stay (HOSPITAL_BASED_OUTPATIENT_CLINIC_OR_DEPARTMENT_OTHER): Payer: Medicaid Other | Admitting: Nurse Practitioner

## 2023-07-23 ENCOUNTER — Encounter: Payer: Self-pay | Admitting: Nurse Practitioner

## 2023-07-23 VITALS — BP 122/68 | HR 87 | Temp 98.4°F | Resp 16 | Wt 173.4 lb

## 2023-07-23 DIAGNOSIS — R530 Neoplastic (malignant) related fatigue: Secondary | ICD-10-CM | POA: Diagnosis not present

## 2023-07-23 DIAGNOSIS — Z515 Encounter for palliative care: Secondary | ICD-10-CM | POA: Diagnosis not present

## 2023-07-23 DIAGNOSIS — G893 Neoplasm related pain (acute) (chronic): Secondary | ICD-10-CM | POA: Diagnosis not present

## 2023-07-23 DIAGNOSIS — Z5111 Encounter for antineoplastic chemotherapy: Secondary | ICD-10-CM | POA: Diagnosis not present

## 2023-07-23 DIAGNOSIS — C160 Malignant neoplasm of cardia: Secondary | ICD-10-CM

## 2023-07-23 LAB — RAPID URINE DRUG SCREEN, HOSP PERFORMED
Amphetamines: NOT DETECTED
Barbiturates: NOT DETECTED
Benzodiazepines: POSITIVE — AB
Cocaine: NOT DETECTED
Opiates: POSITIVE — AB
Tetrahydrocannabinol: NOT DETECTED

## 2023-07-23 MED ORDER — DEXAMETHASONE SODIUM PHOSPHATE 10 MG/ML IJ SOLN
10.0000 mg | Freq: Once | INTRAMUSCULAR | Status: AC
  Administered 2023-07-23: 10 mg via INTRAVENOUS
  Filled 2023-07-23: qty 1

## 2023-07-23 MED ORDER — ALBUTEROL SULFATE HFA 108 (90 BASE) MCG/ACT IN AERS
1.0000 | INHALATION_SPRAY | Freq: Four times a day (QID) | RESPIRATORY_TRACT | Status: DC | PRN
  Administered 2023-07-23: 2 via RESPIRATORY_TRACT
  Filled 2023-07-23: qty 6.7

## 2023-07-23 MED ORDER — HEPARIN SOD (PORK) LOCK FLUSH 100 UNIT/ML IV SOLN
500.0000 [IU] | Freq: Once | INTRAVENOUS | Status: AC | PRN
  Administered 2023-07-23: 500 [IU]

## 2023-07-23 MED ORDER — SODIUM CHLORIDE 0.9 % IV SOLN
240.0000 mg | Freq: Once | INTRAVENOUS | Status: AC
  Administered 2023-07-23: 240 mg via INTRAVENOUS
  Filled 2023-07-23: qty 24

## 2023-07-23 MED ORDER — SODIUM CHLORIDE 0.9% FLUSH
10.0000 mL | INTRAVENOUS | Status: DC | PRN
  Administered 2023-07-23: 10 mL

## 2023-07-23 MED ORDER — DEXTROSE 5 % IV SOLN: INTRAVENOUS | Status: DC

## 2023-07-23 MED ORDER — FLUOROURACIL CHEMO INJECTION 5 GM/100ML
2400.0000 mg/m2 | INTRAVENOUS | Status: DC
  Administered 2023-07-23: 5000 mg via INTRAVENOUS
  Filled 2023-07-23: qty 100

## 2023-07-23 MED ORDER — FLUOROURACIL CHEMO INJECTION 2.5 GM/50ML
400.0000 mg/m2 | Freq: Once | INTRAVENOUS | Status: AC
  Administered 2023-07-23: 750 mg via INTRAVENOUS
  Filled 2023-07-23: qty 15

## 2023-07-23 MED ORDER — OXALIPLATIN CHEMO INJECTION 100 MG/20ML
68.0000 mg/m2 | Freq: Once | INTRAVENOUS | Status: AC
  Administered 2023-07-23: 130 mg via INTRAVENOUS
  Filled 2023-07-23: qty 6

## 2023-07-23 MED ORDER — PALONOSETRON HCL INJECTION 0.25 MG/5ML
0.2500 mg | Freq: Once | INTRAVENOUS | Status: AC
  Administered 2023-07-23: 0.25 mg via INTRAVENOUS
  Filled 2023-07-23: qty 5

## 2023-07-23 MED ORDER — DEXTROSE 5 % IV SOLN
400.0000 mg/m2 | Freq: Once | INTRAVENOUS | Status: AC
  Administered 2023-07-23: 768 mg via INTRAVENOUS
  Filled 2023-07-23: qty 38.4

## 2023-07-23 NOTE — Patient Instructions (Signed)
[facility] - A DEPT OF [facility]  Discharge Instructions: Thank you for choosing [facility] to provide your oncology and hematology care.   If you have a lab appointment with the Cancer Center, please go directly to the Cancer Center and check in at the registration area.   Wear comfortable clothing and clothing appropriate for easy access to any Portacath or PICC line.   We strive to give you quality time with your provider. You may need to reschedule your appointment if you arrive late (15 or more minutes).  Arriving late affects you and other patients whose appointments are after yours.  Also, if you miss three or more appointments without notifying the office, you may be dismissed from the clinic at the provider's discretion.      For prescription refill requests, have your pharmacy contact our office and allow 72 hours for refills to be completed.    Today you received the following chemotherapy and/or immunotherapy agents nivolumab, oxaliplatin, leucovorin      To help prevent nausea and vomiting after your treatment, we encourage you to take your nausea medication as directed.  BELOW ARE SYMPTOMS THAT SHOULD BE REPORTED IMMEDIATELY: *FEVER GREATER THAN 100.4 F (38 C) OR HIGHER *CHILLS OR SWEATING *NAUSEA AND VOMITING THAT IS NOT CONTROLLED WITH YOUR NAUSEA MEDICATION *UNUSUAL SHORTNESS OF BREATH *UNUSUAL BRUISING OR BLEEDING *URINARY PROBLEMS (pain or burning when urinating, or frequent urination) *BOWEL PROBLEMS (unusual diarrhea, constipation, pain near the anus) TENDERNESS IN MOUTH AND THROAT WITH OR WITHOUT PRESENCE OF ULCERS (sore throat, sores in mouth, or a toothache) UNUSUAL RASH, SWELLING OR PAIN  UNUSUAL VAGINAL DISCHARGE OR ITCHING   Items with * indicate a potential emergency and should be followed up as soon as possible or go to the Emergency Department if any problems should occur.  Please show the CHEMOTHERAPY  ALERT CARD or IMMUNOTHERAPY ALERT CARD at check-in to the Emergency Department and triage nurse.  Should you have questions after your visit or need to cancel or reschedule your appointment, please contact [facility] - A [facility]  Dept: [phone]  and follow the prompts.  Office hours are 8:00 a.m. to 4:30 p.m. Monday - Friday. Please note that voicemails left after 4:00 p.m. may not be returned until the following business day.  We are closed weekends and major holidays. You have access to a nurse at all times for urgent questions. Please call the main number to the clinic Dept: [phone] and follow the prompts.   For any non-urgent questions, you may also contact your provider using MyChart. We now offer e-Visits for anyone 43 and older to request care online for non-urgent symptoms. For details visit [email or web address].   Also download the MyChart app! Go to the app store, search "MyChart", open the app, select [place], and log in with your MyChart username and password.

## 2023-07-23 NOTE — Telephone Encounter (Signed)
 Marland Kitchen

## 2023-07-23 NOTE — Progress Notes (Signed)
1040 Pt observed to be unsteady on feet, having difficulty ambulating to bathroom. Pt standing by infusion chair. Pt asked to be seated and replied "I'm ok to stand here." Pt eyes closed and swaying while standing. RN asked pt to be seated for his safety and pt refused to sit. Educated patient on safety and when asked if he understood replied "I don't really understand anything you people are telling me." Will continue to monitor.   1103 Pt tugging at port dressing. Encouraged to not touch port or port dressing while it is accessed. Pt aplogized and stated "it is irritating me." Pt states that he just took his 11am dilauded. Pt appears more awake and alert after taking inhaler.

## 2023-07-23 NOTE — Progress Notes (Signed)
Nutrition Follow-up:  Patient with metastatic adenocarcinoma of GE junction. He is receiving palliative Folfox + Nivolumab q14d. Patient is under the care of Dr. Arlana Pouch.  12/13-1/9 hospital admit - FTT -S/p open Jtube 12/17  Met with patient in infusion. He is sleepy at visit and poor historian. Patient says he is trying to do the tube feeds at home. He states pain is constant and feedings make him more uncomfortable sometimes. Other times, it is fine. Patient is unsure of rate or how many hours he is currently doing. His daughter has been helping him the best she can per pt. RD unsuccessful at reaching daughter via telephone. Patient provided number, but states she likely will not answer as they do not have good service. He is eating some by mouth. Patient states he can get food down just fine, but unable to eat enough to sustain weights. Patient request cup of hot tea.   Medications: reviewed   Labs: 1/16 - Na 134, glucose 117, albumin 3.3  Anthropometrics: Wt 173 lb 6 oz today   1/16 - 175 lb 1.6 oz  12/11 - 174 lb 6.4 oz   Estimated Energy Needs  Kcals: 2350-2750 Protein: 110-125 Fluid: >/=2.3 L  NUTRITION DIAGNOSIS: Inadequate oral intake continues - addressing with TF   MALNUTRITION DIAGNOSIS: Moderate malnutrition continues   INTERVENTION:  RD unsuccessful at reaching daughter via telephone to review TF regimen - will plan to meet with daughter when she arrives to pick pt up from treatment (daughter did not pick him up today as planned) Encouraged po as tolerated, recommend soft moist textures that are easy to swallow  Will continue efforts to educate on tube feedings as able   Osmolite 1.5 - 60 ml/hr x 18 hours provides 1598 kcal, 67g protein FWF: 100 ml QID    MONITORING, EVALUATION, GOAL: wt trends, intake, TF   NEXT VISIT: Wednesday February 5 during infusion

## 2023-07-24 ENCOUNTER — Telehealth: Payer: Self-pay

## 2023-07-24 NOTE — Telephone Encounter (Signed)
-----   Message from Nurse Guilford Shi sent at 07/23/2023 12:55 PM EST ----- Regarding: FT chemo Pasam First time opdivo/folfox. Dr. Arlana Pouch. Completed treatment without incident

## 2023-07-24 NOTE — Telephone Encounter (Signed)
 LM for patient that this nurse was calling to see how they were doing after their treatment. Please call back to Dr. Zenda Alpers nurse at [phone] if they have any questions or concerns regarding the treatment.

## 2023-07-25 ENCOUNTER — Other Ambulatory Visit: Payer: Self-pay | Admitting: *Deleted

## 2023-07-25 ENCOUNTER — Inpatient Hospital Stay: Payer: Medicaid Other

## 2023-07-25 ENCOUNTER — Ambulatory Visit: Payer: Self-pay | Admitting: Nurse Practitioner

## 2023-07-25 ENCOUNTER — Other Ambulatory Visit: Payer: Medicaid Other

## 2023-07-25 ENCOUNTER — Encounter: Payer: Self-pay | Admitting: Oncology

## 2023-07-25 VITALS — BP 108/63 | HR 86 | Temp 97.8°F | Resp 18

## 2023-07-25 DIAGNOSIS — Z5111 Encounter for antineoplastic chemotherapy: Secondary | ICD-10-CM | POA: Diagnosis not present

## 2023-07-25 DIAGNOSIS — Z95828 Presence of other vascular implants and grafts: Secondary | ICD-10-CM | POA: Insufficient documentation

## 2023-07-25 DIAGNOSIS — C160 Malignant neoplasm of cardia: Secondary | ICD-10-CM

## 2023-07-25 LAB — CBC WITH DIFFERENTIAL (CANCER CENTER ONLY)
Abs Immature Granulocytes: 0.05 10*3/uL (ref 0.00–0.07)
Basophils Absolute: 0 10*3/uL (ref 0.0–0.1)
Basophils Relative: 0 %
Eosinophils Absolute: 0.2 10*3/uL (ref 0.0–0.5)
Eosinophils Relative: 2 %
HCT: 26.5 % — ABNORMAL LOW (ref 39.0–52.0)
Hemoglobin: 8 g/dL — ABNORMAL LOW (ref 13.0–17.0)
Immature Granulocytes: 1 %
Lymphocytes Relative: 5 %
Lymphs Abs: 0.5 10*3/uL — ABNORMAL LOW (ref 0.7–4.0)
MCH: 24.1 pg — ABNORMAL LOW (ref 26.0–34.0)
MCHC: 30.2 g/dL (ref 30.0–36.0)
MCV: 79.8 fL — ABNORMAL LOW (ref 80.0–100.0)
Monocytes Absolute: 0.1 10*3/uL (ref 0.1–1.0)
Monocytes Relative: 2 %
Neutro Abs: 7.9 10*3/uL — ABNORMAL HIGH (ref 1.7–7.7)
Neutrophils Relative %: 90 %
Platelet Count: 477 10*3/uL — ABNORMAL HIGH (ref 150–400)
RBC: 3.32 MIL/uL — ABNORMAL LOW (ref 4.22–5.81)
RDW: 17.5 % — ABNORMAL HIGH (ref 11.5–15.5)
WBC Count: 8.7 10*3/uL (ref 4.0–10.5)
nRBC: 0 % (ref 0.0–0.2)

## 2023-07-25 MED ORDER — SODIUM CHLORIDE 0.9% FLUSH
10.0000 mL | Freq: Once | INTRAVENOUS | Status: AC
  Administered 2023-07-25: 10 mL

## 2023-07-25 MED ORDER — HEPARIN SOD (PORK) LOCK FLUSH 100 UNIT/ML IV SOLN
500.0000 [IU] | Freq: Once | INTRAVENOUS | Status: AC
  Administered 2023-07-25: 500 [IU]

## 2023-07-25 NOTE — Telephone Encounter (Signed)
Chief Complaint: Ankle Swelling Symptoms: bilateral ankle swelling Frequency: constant onset 1 week ago  Pertinent Negatives: Patient denies fever, redness, chest pain, calf pain  Disposition: [] ED /[] Urgent Care (no appt availability in office) / [x] Appointment(In office/virtual)/ []  [facility]/ [] Home Care/ [] Refused Recommended Disposition /[] [facility]/ []  Follow-up with PCP Additional Notes: Spoke to patient's mother in law who stated the patient has had swollen ankles for 1 week and was unable to schedule an appointment in office due to his chemo treatment for cancer. Patient has not complained of pain. Cancer center recommended patient be evaluated by primary care due to the new onset of ankle swelling. Care advice was given and the patient has been scheduled for evaluation on 07/29/23.  Copied from CRM [id number]. Topic: Appointments - Appointment Scheduling >> Jul 25, 2023  3:29 PM Phill Myron wrote: Swollen ankles, hx of cancer stage 4 Reason for Disposition  MILD or MODERATE ankle swelling (e.g., can't move joint normally, can't do usual activities) (Exceptions: Itchy, localized swelling; swelling is chronic.)  Answer Assessment - Initial Assessment Questions 1. LOCATION: "Which ankle is swollen?" "Where is the swelling?"     Bilateral  2. ONSET: "When did the swelling start?"     1 week ago  3. SWELLING: "How bad is the swelling?" Or, "How large is it?" (e.g., mild, moderate, severe; size of localized swelling)    - NONE: No joint swelling.   - LOCALIZED: Localized; small area of puffy or swollen skin (e.g., insect bite, skin irritation).   - MILD: Joint looks or feels mildly swollen or puffy.   - MODERATE: Swollen; interferes with normal activities (e.g., work or school); decreased range of movement; may be limping.   - SEVERE: Very swollen; can't move swollen joint at all; limping a lot or unable to walk.     Moderate  4. PAIN: "Is there any  pain?" If Yes, ask: "How bad is it?" (Scale 1-10; or mild, moderate, severe)   - NONE (0): no pain.   - MILD (1-3): doesn't interfere with normal activities.    - MODERATE (4-7): interferes with normal activities (e.g., work or school) or awakens from sleep, limping.    - SEVERE (8-10): excruciating pain, unable to do any normal activities, unable to walk.      No  5. CAUSE: "What do you think caused the ankle swelling?"     I'm not sure he had has stage 4 cancer  6. OTHER SYMPTOMS: "Do you have any other symptoms?" (e.g., fever, chest pain, difficulty breathing, calf pain)     None reported  Protocols used: Ankle Swelling-A-AH

## 2023-07-26 NOTE — Telephone Encounter (Signed)
Called pt  no answer. Lvm if he is still feeling bad go to the ER and he can still come to his appt  07/29/23. KH

## 2023-07-29 ENCOUNTER — Other Ambulatory Visit: Payer: Self-pay | Admitting: Nurse Practitioner

## 2023-07-29 ENCOUNTER — Other Ambulatory Visit (HOSPITAL_COMMUNITY): Payer: Self-pay

## 2023-07-29 ENCOUNTER — Ambulatory Visit (INDEPENDENT_AMBULATORY_CARE_PROVIDER_SITE_OTHER): Payer: Medicaid Other | Admitting: Nurse Practitioner

## 2023-07-29 ENCOUNTER — Encounter: Payer: Self-pay | Admitting: Nurse Practitioner

## 2023-07-29 VITALS — BP 108/75 | HR 93 | Temp 97.2°F | Wt 176.4 lb

## 2023-07-29 DIAGNOSIS — Z515 Encounter for palliative care: Secondary | ICD-10-CM

## 2023-07-29 DIAGNOSIS — G893 Neoplasm related pain (acute) (chronic): Secondary | ICD-10-CM

## 2023-07-29 DIAGNOSIS — R600 Localized edema: Secondary | ICD-10-CM

## 2023-07-29 DIAGNOSIS — C160 Malignant neoplasm of cardia: Secondary | ICD-10-CM

## 2023-07-29 MED ORDER — HYDROMORPHONE HCL 2 MG PO TABS
2.0000 mg | ORAL_TABLET | ORAL | 0 refills | Status: DC | PRN
  Filled ????-??-??: fill #0

## 2023-07-29 MED ORDER — FUROSEMIDE 20 MG PO TABS: 20.0000 mg | ORAL_TABLET | Freq: Every day | ORAL | 0 refills | Status: DC

## 2023-07-29 NOTE — Progress Notes (Signed)
Subjective   Patient ID: Jesus Haynes, male    DOB: 10-15-1975, 48 y.o.   MRN: [id number]  Chief Complaint  Patient presents with   Joint Swelling    Both ankles been swelling for a couple of weeks    Hospitalization Follow-up    Referring provider: Ivonne Andrew, NP  Jesus Haynes is a 48 y.o. male with Past Medical History: No date: Asthma  HPI  Patient presents today for peripheral edema and hospital follow-up.  Patient was recently seen in the ED for abdominal pain related to adenocarcinoma of gastroesophageal junction.  He has followed with [facility] with palliative. He does have a visit this week with oncology.  States for the past few weeks he has been having peripheral edema.  We discussed that he does need to wear compression hose and keep legs elevated.  We will trial a few days of Lasix to see if this helps.  I have notified oncology.  Recent labs indicated good kidney function. Denies f/c/s, n/v/d, hemoptysis, PND, leg swelling Denies chest pain or edema    No Known Allergies   There is no immunization history on file for this patient.  Tobacco History: Social History   Tobacco Use  Smoking Status Former   Current packs/day: 0.00   Types: Cigarettes   Quit date: 12/26/2012   Years since quitting: 10.5  Smokeless Tobacco Never   Counseling given: Not Answered   Outpatient Encounter Medications as of 07/29/2023  Medication Sig   albuterol (PROVENTIL) (5 MG/ML) 0.5% nebulizer solution Take 0.5 mLs (2.5 mg total) by nebulization every 6 (six) hours as needed for wheezing or shortness of breath.   albuterol (VENTOLIN HFA) 108 (90 Base) MCG/ACT inhaler Inhale 1-2 puffs into the lungs every 4 (four) hours as needed for wheezing or shortness of breath.   cyanocobalamin 1000 MCG tablet Take 1 tablet (1,000 mcg total) by mouth daily.   dexamethasone (DECADRON) 4 MG tablet Take 2 tablets (8 mg total) by mouth daily. Start the day after  chemotherapy for 2 days. Take with food.   folic acid (FOLVITE) 1 MG tablet Take 1 tablet (1 mg total) by mouth daily.   furosemide (LASIX) 20 MG tablet Take 1 tablet (20 mg total) by mouth daily.   HYDROmorphone (DILAUDID) 2 MG tablet Take 1 tablet (2 mg total) by mouth every 4 (four) hours as needed for severe pain (pain score 7-10).   HYDROmorphone (DILAUDID) 2 MG tablet Take 1 tablet (2 mg total) by mouth every 4 (four) hours as needed for severe pain (pain score 7-10).   lidocaine-prilocaine (EMLA) cream Apply to affected area once   meclizine (ANTIVERT) 25 MG tablet Take 1 tablet (25 mg total) by mouth 3 (three) times daily as needed for dizziness.   methadone (DOLOPHINE) 5 MG tablet Take 1 tablet (5 mg total) by mouth every 8 (eight) hours.   metoCLOPramide (REGLAN) 10 MG tablet Take 1 tablet (10 mg total) by mouth 4 (four) times daily -  before meals and at bedtime.   Nutritional Supplements (FEEDING SUPPLEMENT, OSMOLITE 1.5 CAL,) LIQD At 60 mL/h over 18 hours on a daily basis.   pantoprazole (PROTONIX) 40 MG tablet Take 1 tablet (40 mg total) by mouth 2 (two) times daily.   polyethylene glycol powder (GLYCOLAX/MIRALAX) 17 GM/SCOOP powder Dissolve 17 g in 4 oz of liquid and take by mouth daily.   potassium chloride (KLOR-CON) 20 MEQ packet Take 20 mEq by mouth  daily.   prochlorperazine (COMPAZINE) 10 MG tablet Take 1 tablet (10 mg total) by mouth every 6 (six) hours as needed for nausea or vomiting.   senna-docusate (SENOKOT-S) 8.6-50 MG tablet Take 2 tablets by mouth at bedtime.   Water For Irrigation, Sterile (FREE WATER) SOLN Place 100 mLs into feeding tube 4 (four) times daily.   ondansetron (ZOFRAN) 8 MG tablet Take 1 tablet (8 mg total) by mouth every 8 (eight) hours as needed for nausea or vomiting. Start on the third day after chemotherapy. (Patient not taking: Reported on 07/29/2023)   [DISCONTINUED] cetirizine (ZYRTEC) 10 MG tablet Take 1 tablet (10 mg total) by mouth daily.  (Patient not taking: Reported on 01/26/2019)   [DISCONTINUED] fluticasone (FLONASE) 50 MCG/ACT nasal spray Place 1-2 sprays into both nostrils daily. (Patient not taking: Reported on 01/26/2019)   No facility-administered encounter medications on file as of 07/29/2023.    Review of Systems  Review of Systems  Constitutional: Negative.   Cardiovascular:  Positive for leg swelling.  Gastrointestinal: Negative.   Allergic/Immunologic: Negative.   Neurological: Negative.   Psychiatric/Behavioral: Negative.       Objective:   BP 108/75   Pulse 93   Temp (!) 97.2 F (36.2 C)   Wt 176 lb 6.4 oz (80 kg)   SpO2 98%   BMI 28.47 kg/m   Wt Readings from Last 5 Encounters:  07/29/23 176 lb 6.4 oz (80 kg)  07/23/23 173 lb 6 oz (78.6 kg)  07/20/23 175 lb (79.4 kg)  07/18/23 175 lb 1.6 oz (79.4 kg)  07/10/23 178 lb 2.1 oz (80.8 kg)     Physical Exam Vitals and nursing note reviewed.  Constitutional:      General: He is not in acute distress.    Appearance: He is well-developed.  Cardiovascular:     Rate and Rhythm: Normal rate and regular rhythm.  Pulmonary:     Effort: Pulmonary effort is normal.     Breath sounds: Normal breath sounds.  Musculoskeletal:     Right lower leg: Edema present.     Left lower leg: Edema present.  Skin:    General: Skin is warm and dry.  Neurological:     Mental Status: He is alert and oriented to person, place, and time.       Assessment & Plan:   Peripheral edema -     Furosemide; Take 1 tablet (20 mg total) by mouth daily.  Dispense: 4 tablet; Refill: 0     Return in about 4 weeks (around 08/26/2023) for edema.   Ivonne Andrew, NP 07/29/2023

## 2023-07-29 NOTE — Patient Instructions (Signed)
1. Peripheral edema (Primary)  - furosemide (LASIX) 20 MG tablet; Take 1 tablet (20 mg total) by mouth daily.  Dispense: 4 tablet; Refill: 0  -please keep follow up with oncology this Thursday  - please wear compression hose  -keep legs elevated when possible   Follow up:  Follow up in 3 months

## 2023-07-30 ENCOUNTER — Encounter (HOSPITAL_COMMUNITY): Payer: Self-pay | Admitting: Emergency Medicine

## 2023-07-30 ENCOUNTER — Other Ambulatory Visit: Payer: Self-pay

## 2023-07-30 ENCOUNTER — Inpatient Hospital Stay (HOSPITAL_COMMUNITY)
Admission: EM | Admit: 2023-07-30 | Discharge: 2023-08-02 | DRG: 374 | Disposition: A | Discharge status: Home Health | Payer: Medicaid Other | Attending: Family Medicine | Admitting: Family Medicine

## 2023-07-30 ENCOUNTER — Emergency Department (HOSPITAL_COMMUNITY): Payer: Medicaid Other

## 2023-07-30 DIAGNOSIS — Z8249 Family history of ischemic heart disease and other diseases of the circulatory system: Secondary | ICD-10-CM

## 2023-07-30 DIAGNOSIS — J9811 Atelectasis: Secondary | ICD-10-CM | POA: Diagnosis present

## 2023-07-30 DIAGNOSIS — K921 Melena: Secondary | ICD-10-CM | POA: Diagnosis present

## 2023-07-30 DIAGNOSIS — D62 Acute posthemorrhagic anemia: Secondary | ICD-10-CM | POA: Diagnosis present

## 2023-07-30 DIAGNOSIS — J45909 Unspecified asthma, uncomplicated: Secondary | ICD-10-CM | POA: Diagnosis present

## 2023-07-30 DIAGNOSIS — G893 Neoplasm related pain (acute) (chronic): Secondary | ICD-10-CM

## 2023-07-30 DIAGNOSIS — E43 Unspecified severe protein-calorie malnutrition: Secondary | ICD-10-CM | POA: Diagnosis present

## 2023-07-30 DIAGNOSIS — Z6828 Body mass index (BMI) 28.0-28.9, adult: Secondary | ICD-10-CM

## 2023-07-30 DIAGNOSIS — K922 Gastrointestinal hemorrhage, unspecified: Principal | ICD-10-CM

## 2023-07-30 DIAGNOSIS — Z515 Encounter for palliative care: Secondary | ICD-10-CM

## 2023-07-30 DIAGNOSIS — K5900 Constipation, unspecified: Secondary | ICD-10-CM | POA: Diagnosis present

## 2023-07-30 DIAGNOSIS — Z934 Other artificial openings of gastrointestinal tract status: Secondary | ICD-10-CM

## 2023-07-30 DIAGNOSIS — E871 Hypo-osmolality and hyponatremia: Secondary | ICD-10-CM | POA: Diagnosis present

## 2023-07-30 DIAGNOSIS — Z79899 Other long term (current) drug therapy: Secondary | ICD-10-CM

## 2023-07-30 DIAGNOSIS — C160 Malignant neoplasm of cardia: Principal | ICD-10-CM

## 2023-07-30 DIAGNOSIS — D500 Iron deficiency anemia secondary to blood loss (chronic): Secondary | ICD-10-CM

## 2023-07-30 DIAGNOSIS — Z85028 Personal history of other malignant neoplasm of stomach: Secondary | ICD-10-CM

## 2023-07-30 DIAGNOSIS — Z87891 Personal history of nicotine dependence: Secondary | ICD-10-CM

## 2023-07-30 LAB — BASIC METABOLIC PANEL
Anion gap: 9 (ref 5–15)
BUN: 10 mg/dL (ref 6–20)
CO2: 26 mmol/L (ref 22–32)
Calcium: 8.1 mg/dL — ABNORMAL LOW (ref 8.9–10.3)
Chloride: 97 mmol/L — ABNORMAL LOW (ref 98–111)
Creatinine, Ser: 0.59 mg/dL — ABNORMAL LOW (ref 0.61–1.24)
GFR, Estimated: >60 mL/min (ref >60)
Glucose, Bld: 107 mg/dL — ABNORMAL HIGH (ref 70–99)
Potassium: 3.6 mmol/L (ref 3.5–5.1)
Sodium: 132 mmol/L — ABNORMAL LOW (ref 135–145)

## 2023-07-30 LAB — CBG MONITORING, ED: Glucose-Capillary: 102 mg/dL — ABNORMAL HIGH (ref 70–99)

## 2023-07-30 LAB — CBC
HCT: 22.6 % — ABNORMAL LOW (ref 39.0–52.0)
Hemoglobin: 6.8 g/dL — CL (ref 13.0–17.0)
MCH: 24.3 pg — ABNORMAL LOW (ref 26.0–34.0)
MCHC: 30.1 g/dL (ref 30.0–36.0)
MCV: 80.7 fL (ref 80.0–100.0)
Platelets: 290 10*3/uL (ref 150–400)
RBC: 2.8 MIL/uL — ABNORMAL LOW (ref 4.22–5.81)
RDW: 18.3 % — ABNORMAL HIGH (ref 11.5–15.5)
WBC: 6.5 10*3/uL (ref 4.0–10.5)
nRBC: 0.3 % — ABNORMAL HIGH (ref 0.0–0.2)

## 2023-07-30 LAB — TROPONIN I (HIGH SENSITIVITY)
Troponin I (High Sensitivity): 3 ng/L (ref <18)
Troponin I (High Sensitivity): 4 ng/L (ref <18)

## 2023-07-30 LAB — POC OCCULT BLOOD, ED: Fecal Occult Bld: POSITIVE — AB

## 2023-07-30 LAB — PREPARE RBC (CROSSMATCH)

## 2023-07-30 MED ORDER — METHADONE HCL 10 MG PO TABS
5.0000 mg | ORAL_TABLET | Freq: Three times a day (TID) | ORAL | Status: DC
  Filled 2023-07-30: qty 1

## 2023-07-30 MED ORDER — IPRATROPIUM-ALBUTEROL 0.5-2.5 (3) MG/3ML IN SOLN
3.0000 mL | Freq: Once | RESPIRATORY_TRACT | Status: AC
  Administered 2023-07-30: 3 mL via RESPIRATORY_TRACT
  Filled 2023-07-30: qty 3

## 2023-07-30 MED ORDER — ONDANSETRON HCL 4 MG/2ML IJ SOLN
4.0000 mg | Freq: Once | INTRAMUSCULAR | Status: AC
  Administered 2023-07-30: 4 mg via INTRAVENOUS
  Filled 2023-07-30: qty 2

## 2023-07-30 MED ORDER — POLYETHYLENE GLYCOL 3350 17 G PO PACK
17.0000 g | PACK | Freq: Every day | ORAL | Status: DC
  Administered 2023-07-31 – 2023-08-02 (×3): 17 g
  Filled 2023-07-30 (×3): qty 1

## 2023-07-30 MED ORDER — ACETAMINOPHEN 650 MG RE SUPP: 650.0000 mg | Freq: Four times a day (QID) | RECTAL | Status: DC | PRN

## 2023-07-30 MED ORDER — ONDANSETRON HCL 4 MG/2ML IJ SOLN: 4.0000 mg | Freq: Four times a day (QID) | INTRAMUSCULAR | Status: DC | PRN

## 2023-07-30 MED ORDER — PANTOPRAZOLE SODIUM 40 MG IV SOLR
40.0000 mg | Freq: Once | INTRAVENOUS | Status: AC
  Administered 2023-07-30: 40 mg via INTRAVENOUS
  Filled 2023-07-30: qty 10

## 2023-07-30 MED ORDER — FUROSEMIDE 20 MG PO TABS
20.0000 mg | ORAL_TABLET | Freq: Every day | ORAL | Status: DC
  Administered 2023-07-30 – 2023-08-01 (×3): 20 mg
  Filled 2023-07-30 (×3): qty 1

## 2023-07-30 MED ORDER — ONDANSETRON HCL 4 MG PO TABS
4.0000 mg | ORAL_TABLET | Freq: Four times a day (QID) | ORAL | Status: DC | PRN
Start: 2023-07-30 — End: 2023-08-01

## 2023-07-30 MED ORDER — PANTOPRAZOLE SODIUM 40 MG IV SOLR
40.0000 mg | Freq: Two times a day (BID) | INTRAVENOUS | Status: DC
  Administered 2023-07-30 – 2023-08-02 (×6): 40 mg via INTRAVENOUS
  Filled 2023-07-30 (×7): qty 10

## 2023-07-30 MED ORDER — METHADONE HCL 10 MG PO TABS
5.0000 mg | ORAL_TABLET | Freq: Two times a day (BID) | ORAL | Status: DC
  Administered 2023-07-30 – 2023-08-01 (×4): 5 mg
  Filled 2023-07-30 (×3): qty 1

## 2023-07-30 MED ORDER — OSMOLITE 1.5 CAL PO LIQD
1000.0000 mL | ORAL | Status: DC
  Administered 2023-07-30 – 2023-08-01 (×3): 1000 mL
  Filled 2023-07-30 (×3): qty 1000

## 2023-07-30 MED ORDER — SODIUM CHLORIDE 0.9% IV SOLUTION: Freq: Once | INTRAVENOUS | Status: AC

## 2023-07-30 MED ORDER — ACETAMINOPHEN 325 MG PO TABS
650.0000 mg | ORAL_TABLET | Freq: Four times a day (QID) | ORAL | Status: DC | PRN
  Administered 2023-07-31 – 2023-08-01 (×2): 650 mg via ORAL
  Filled 2023-07-30 (×2): qty 2

## 2023-07-30 MED ORDER — FREE WATER
100.0000 mL | Freq: Four times a day (QID) | Status: DC
  Administered 2023-07-30 – 2023-08-02 (×11): 100 mL

## 2023-07-30 MED ORDER — SUCRALFATE 1 GM/10ML PO SUSP
1.0000 g | Freq: Three times a day (TID) | ORAL | Status: DC
  Administered 2023-07-30 – 2023-08-02 (×11): 1 g via ORAL
  Filled 2023-07-30 (×11): qty 10

## 2023-07-30 MED ORDER — HYDROMORPHONE HCL 2 MG PO TABS
2.0000 mg | ORAL_TABLET | ORAL | Status: DC | PRN
  Administered 2023-07-30 – 2023-08-01 (×10): 2 mg
  Filled 2023-07-30 (×10): qty 1

## 2023-07-30 MED ORDER — IPRATROPIUM-ALBUTEROL 0.5-2.5 (3) MG/3ML IN SOLN
3.0000 mL | RESPIRATORY_TRACT | Status: AC | PRN
  Administered 2023-07-31 – 2023-08-01 (×3): 3 mL via RESPIRATORY_TRACT
  Filled 2023-07-30 (×3): qty 3

## 2023-07-30 MED ORDER — POTASSIUM CHLORIDE 20 MEQ PO PACK
20.0000 meq | PACK | Freq: Every day | ORAL | Status: DC
  Administered 2023-07-30 – 2023-08-02 (×4): 20 meq
  Filled 2023-07-30 (×4): qty 1

## 2023-07-30 NOTE — ED Triage Notes (Signed)
Pt here from home with c/o chest pain center of  his chest non radiating , no son or n/v , pt is currently receiving chemo for CA

## 2023-07-30 NOTE — ED Provider Triage Note (Signed)
Emergency Medicine Provider Triage Evaluation Note  Jesus Haynes , a 48 y.o. male  was evaluated in triage.  Pt complains of CP, sharp and shooting, "whole chest" worse with movement and taking deep breath in, associated with SOB, unsure of when it started ? 3-4 days ago. Does have history of similar. Chemo for adenocarcinoma.   Review of Systems  Positive: CP, SOB Negative: Cough  Physical Exam  BP 123/75 (BP Location: Left Arm)   Pulse 95   Temp 98.7 F (37.1 C) (Oral)   Resp 16   SpO2 100%  Gen:   Awake, no distress   Resp:  Normal effort, mild wheezing, no distress, not hypoxic  MSK:   Moves extremities without difficulty  Other:    Medical Decision Making  Medically screening exam initiated at 9:17 AM.  Appropriate orders placed.  Jesus Haynes was informed that the remainder of the evaluation will be completed by another provider, this initial triage assessment does not replace that evaluation, and the importance of remaining in the ED until their evaluation is complete.     Smitty Knudsen, [place] 07/30/23 [id number]

## 2023-07-30 NOTE — Plan of Care (Signed)
Problem: Clinical Measurements: Goal: Respiratory complications will improve Outcome: Progressing   Problem: Clinical Measurements: Goal: Cardiovascular complication will be avoided Outcome: Progressing   Problem: Elimination: Goal: Will not experience complications related to bowel motility Outcome: Progressing

## 2023-07-30 NOTE — H&P (Signed)
History and Physical    Patient: Jesus Haynes ZOX:[id number] DOB: [birth year] DOA: 07/30/2023 DOS: the patient was seen and examined on 07/30/2023 PCP: Ivonne Andrew, NP  Patient coming from: Home  Chief Complaint:  Chief Complaint  Patient presents with   Chest Pain   HPI: Jesus Haynes is a 48 y.o. 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  He has been having melena and has been more fatigued.   He has had several episodes of melena.  No diarrhea, constipation or hematochezia. He denied fever, chills, rhinorrhea, sore throat, wheezing or hemoptysis.  No chest pain, palpitations, diaphoresis, PND, orthopnea or pitting edema of the lower extremities. No flank pain, dysuria, frequency or hematuria.  No polyuria, polydipsia, polyphagia or blurred vision.   Lab work: Fecal occult blood was positive.  CBCs are white count 6.5, hemoglobin 6.8 g/dL and platelets 811.  First troponin level was normal.  BMP showed a 732, potassium 3.6, chloride 97 CO2 26 mmol/L.  Glucose 97, BUN 10, creatinine 0.59 and calcium 8.1 mg/dL.  Imaging: 2 view chest radiograph showing small bilateral pleural effusions and bibasilar atelectasis.   ED course: Initial vital signs were temperature 98.7 F, pulse 95, respirations 16, BP 123/75 mmHg O2 sat 100% on room air.  The patient received DuoNeb, ondansetron 4 mg IVP, pantoprazole 40 mg IVP and a 2 PRBC unit blood transfusion.  Review of Systems: As mentioned in the history of present illness. All other systems reviewed and are negative.  Past Medical History:  Diagnosis Date   Asthma    Past Surgical History:  Procedure Laterality Date    BIOPSY  05/28/2023   Procedure: BIOPSY;  Surgeon: Vida Rigger, MD;  Location: [facility];  Service: Gastroenterology;;   ESOPHAGOGASTRODUODENOSCOPY (EGD) WITH PROPOFOL N/A 05/28/2023   Procedure: ESOPHAGOGASTRODUODENOSCOPY (EGD) WITH PROPOFOL;  Surgeon: Vida Rigger, MD;  Location: [facility];  Service: Gastroenterology;  Laterality: N/A;   IR IMAGING GUIDED PORT INSERTION  06/13/2023   UMBILICAL HERNIA REPAIR N/A 06/18/2023   Procedure: PLACEMENT OF J TUBE;  Surgeon: Fritzi Mandes, MD;  Location: [facility];  Service: General;  Laterality: N/A;   Social History:  reports that he quit smoking about 10 years ago. His smoking use included cigarettes. He has never used smokeless tobacco. He reports that he does not drink alcohol and does not use drugs.  No Known Allergies  Family History  Problem Relation Age of Onset   Heart failure Father     Prior to Admission medications   Medication Sig Start Date End Date Taking? Authorizing Provider  albuterol (PROVENTIL) (5 MG/ML) 0.5% nebulizer solution Take 0.5 mLs (2.5 mg total) by nebulization every 6 (six) hours as needed for wheezing or shortness of breath. 04/28/23   Roxy Horseman, PA-C  albuterol (VENTOLIN HFA) 108 (90 Base) MCG/ACT inhaler Inhale 1-2 puffs into the lungs every 4 (four) hours as needed for wheezing or shortness of breath. 06/20/22   Mare Ferrari, PA-C  cyanocobalamin 1000 MCG tablet Take 1 tablet (1,000 mcg total) by mouth daily. 07/10/23   Osvaldo Shipper, MD  dexamethasone (DECADRON) 4 MG tablet Take 2 tablets (8 mg total) by mouth daily. Start the day after chemotherapy for  2 days. Take with food. 07/11/23   Pasam, Archie Patten, MD  folic acid (FOLVITE) 1 MG tablet Take 1 tablet (1 mg total) by mouth daily. 07/10/23   Osvaldo Shipper, MD  furosemide (LASIX) 20 MG tablet Take 1 tablet (20 mg total) by mouth daily. 07/29/23   Ivonne Andrew, NP  HYDROmorphone (DILAUDID) 2 MG tablet Take 1 tablet (2 mg total) by mouth every 4  (four) hours as needed for severe pain (pain score 7-10). 08/05/23   Pickenpack-Cousar, Arty Baumgartner, NP  lidocaine-prilocaine (EMLA) cream Apply to affected area once 07/11/23   Pasam, Avinash, MD  meclizine (ANTIVERT) 25 MG tablet Take 1 tablet (25 mg total) by mouth 3 (three) times daily as needed for dizziness. 07/10/23   Osvaldo Shipper, MD  methadone (DOLOPHINE) 5 MG tablet Take 1 tablet (5 mg total) by mouth every 8 (eight) hours. 07/18/23   Pickenpack-Cousar, Arty Baumgartner, NP  metoCLOPramide (REGLAN) 10 MG tablet Take 1 tablet (10 mg total) by mouth 4 (four) times daily -  before meals and at bedtime. 07/10/23   Osvaldo Shipper, MD  Nutritional Supplements (FEEDING SUPPLEMENT, OSMOLITE 1.5 CAL,) LIQD At 60 mL/h over 18 hours on a daily basis. 07/10/23   Osvaldo Shipper, MD  ondansetron (ZOFRAN) 8 MG tablet Take 1 tablet (8 mg total) by mouth every 8 (eight) hours as needed for nausea or vomiting. Start on the third day after chemotherapy. Patient not taking: Reported on 07/29/2023 07/11/23   Pasam, Archie Patten, MD  pantoprazole (PROTONIX) 40 MG tablet Take 1 tablet (40 mg total) by mouth 2 (two) times daily. 07/10/23 09/08/23  Osvaldo Shipper, MD  polyethylene glycol powder (GLYCOLAX/MIRALAX) 17 GM/SCOOP powder Dissolve 17 g in 4 oz of liquid and take by mouth daily. 07/10/23   Osvaldo Shipper, MD  potassium chloride (KLOR-CON) 20 MEQ packet Take 20 mEq by mouth daily. 07/10/23   Osvaldo Shipper, MD  prochlorperazine (COMPAZINE) 10 MG tablet Take 1 tablet (10 mg total) by mouth every 6 (six) hours as needed for nausea or vomiting. 07/11/23   Pasam, Archie Patten, MD  senna-docusate (SENOKOT-S) 8.6-50 MG tablet Take 2 tablets by mouth at bedtime. 07/10/23   Osvaldo Shipper, MD  Water For Irrigation, Sterile (FREE WATER) SOLN Place 100 mLs into feeding tube 4 (four) times daily. 07/10/23   Osvaldo Shipper, MD  cetirizine (ZYRTEC) 10 MG tablet Take 1 tablet (10 mg total) by mouth daily. Patient not taking: Reported on 01/26/2019 02/23/18  07/26/20  Maczis, Elmer Sow, PA-C  fluticasone [facility]) 50 MCG/ACT nasal spray Place 1-2 sprays into both nostrils daily. Patient not taking: Reported on 01/26/2019 02/23/18 07/26/20  Jacinto Halim, PA-C    Physical Exam: Vitals:   07/30/23 0846 07/30/23 1130  BP: 123/75 109/60  Pulse: 95 81  Resp: 16 15  Temp: 98.7 F (37.1 C)   TempSrc: Oral   SpO2: 100% 97%   Physical Exam Vitals reviewed.  Constitutional:      General: He is awake. He is not in acute distress.    Appearance: He is ill-appearing.  HENT:     Head: Normocephalic.     Nose: No rhinorrhea.     Mouth/Throat:     Mouth: Mucous membranes are dry.  Eyes:     General: No scleral icterus.    Pupils: Pupils are equal, round, and reactive to light.  Neck:     Vascular: No JVD.  Cardiovascular:     Heart sounds: S1 normal and S2 normal.  Pulmonary:  Breath sounds: No wheezing, rhonchi or rales.  Abdominal:     General: The ostomy site is clean. Bowel sounds are normal. There is no distension.     Palpations: Abdomen is soft.     Tenderness: There is abdominal tenderness. There is no right CVA tenderness, left CVA tenderness, guarding or rebound.  Musculoskeletal:     Cervical back: Neck supple.     Right lower leg: No edema.     Left lower leg: No edema.  Neurological:     General: No focal deficit present.     Mental Status: He is alert and oriented to person, place, and time.  Psychiatric:        Mood and Affect: Mood normal.        Behavior: Behavior normal. Behavior is cooperative.     Data Reviewed:  Results are pending, will review when available. 06/15/2023 transthoracic echocardiogram. IMPRESSIONS:   1. Left ventricular ejection fraction, by estimation, is 60 to 65%. The  left ventricle has normal function. The left ventricle has no regional  wall motion abnormalities. Left ventricular diastolic parameters were  normal.   2. Right ventricular systolic function is normal. The right  ventricular  size is normal.   3. The mitral valve is normal in structure. No evidence of mitral valve  regurgitation. No evidence of mitral stenosis.   4. The aortic valve is normal in structure. Aortic valve regurgitation is  not visualized. No aortic stenosis is present.   5. The inferior vena cava is normal in size with greater than 50%  respiratory variability, suggesting right atrial pressure of 3 mmHg.   EKG: Vent. rate 88 BPM PR interval 127 ms QRS duration 92 ms QT/QTcB 343/415 ms P-R-T axes 68 65 5 Sinus rhythm Baseline wander in lead(s) V2  Assessment and Plan: Principal Problem:   Melena associated with   Acute on chronic blood loss anemia In the setting of:   Upper GI bleed Secondary to:   Adenocarcinoma of gastroesophageal junction (HCC) Admit to stepdown/inpatient. Resume feeding. Continue pantoprazole 40 mg IVP twice daily. Continue transfusion of PRBC. Monitor H&H. Transfuse further as needed. GI consult  appreciated. -Will follow the recommendations.  Active Problems:   Asthma Continue albuterol MDI as needed.    Constipation Continue MiraLAX 17 g p.o. daily.    Hyponatremia In the setting of volume loss. Follow-up sodium level.    Hypocalcemia Recheck calcium level in AM. Further workup depending on results.    Advance Care Planning:   Code Status: Full Code   Consults: [facility] [name], DO).  Family Communication:   Severity of Illness: The appropriate patient status for this patient is INPATIENT. Inpatient status is judged to be reasonable and necessary in order to provide the required intensity of service to ensure the patient's safety. The patient's presenting symptoms, physical exam findings, and initial radiographic and laboratory data in the context of their chronic comorbidities is felt to place them at high risk for further clinical deterioration. Furthermore, it is not anticipated that the patient will be medically stable  for discharge from the hospital within 2 midnights of admission.   * I certify that at the point of admission it is my clinical judgment that the patient will require inpatient hospital care spanning beyond 2 midnights from the point of admission due to high intensity of service, high risk for further deterioration and high frequency of surveillance required.*  Author: Bobette Mo, MD 07/30/2023 11:50 AM  For  on call review [email or web address].   This document was prepared using Dragon voice recognition software and may contain some unintended transcription errors.

## 2023-07-30 NOTE — ED Provider Notes (Signed)
[place] EMERGENCY DEPARTMENT AT [facility] Provider Note   CSN: [id number] Arrival date & time: 07/30/23  4098     History {Add pertinent medical, surgical, social history, OB history to HPI:1} Chief Complaint  Patient presents with   Chest Pain    Jesus Haynes is a 48 y.o. male.  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  Patient complains of weakness and chest discomfort.   Chest Pain      Home Medications Prior to Admission medications   Medication Sig Start Date End Date Taking? Authorizing Provider  albuterol (PROVENTIL) (5 MG/ML) 0.5% nebulizer solution Take 0.5 mLs (2.5 mg total) by nebulization every 6 (six) hours as needed for wheezing or shortness of breath. 04/28/23   Roxy Horseman, PA-C  albuterol (VENTOLIN HFA) 108 (90 Base) MCG/ACT inhaler Inhale 1-2 puffs into the lungs every 4 (four) hours as needed for wheezing or shortness of breath. 06/20/22   Mare Ferrari, PA-C  cyanocobalamin 1000 MCG tablet Take 1 tablet (1,000 mcg total) by mouth daily. 07/10/23   Osvaldo Shipper, MD  dexamethasone (DECADRON) 4 MG tablet Take 2 tablets (8 mg total) by mouth daily. Start the day after chemotherapy for 2 days. Take with food. 07/11/23   Pasam, Archie Patten, MD  folic acid (FOLVITE) 1 MG tablet Take 1 tablet (1 mg total) by mouth daily. 07/10/23   Osvaldo Shipper, MD  furosemide (LASIX) 20 MG tablet Take 1 tablet (20 mg total) by mouth daily. 07/29/23   Ivonne Andrew, NP  HYDROmorphone (DILAUDID) 2 MG tablet Take 1 tablet (2 mg total) by mouth every 4 (four) hours as needed for severe pain (pain score 7-10). 08/05/23   Pickenpack-Cousar, Arty Baumgartner, NP  lidocaine-prilocaine (EMLA) cream Apply to affected area once 07/11/23   Pasam, Avinash, MD  meclizine (ANTIVERT) 25 MG tablet Take 1 tablet (25 mg total) by mouth 3 (three) times daily as needed for dizziness. 07/10/23   Osvaldo Shipper, MD  methadone (DOLOPHINE) 5 MG tablet Take 1 tablet (5 mg  total) by mouth every 8 (eight) hours. 07/18/23   Pickenpack-Cousar, Arty Baumgartner, NP  metoCLOPramide (REGLAN) 10 MG tablet Take 1 tablet (10 mg total) by mouth 4 (four) times daily -  before meals and at bedtime. 07/10/23   Osvaldo Shipper, MD  Nutritional Supplements (FEEDING SUPPLEMENT, OSMOLITE 1.5 CAL,) LIQD At 60 mL/h over 18 hours on a daily basis. 07/10/23   Osvaldo Shipper, MD  ondansetron (ZOFRAN) 8 MG tablet Take 1 tablet (8 mg total) by mouth every 8 (eight) hours as needed for nausea or vomiting. Start on the third day after chemotherapy. Patient not taking: Reported on 07/29/2023 07/11/23   Pasam, Archie Patten, MD  pantoprazole (PROTONIX) 40 MG tablet Take 1 tablet (40 mg total) by mouth 2 (two) times daily. 07/10/23 09/08/23  Osvaldo Shipper, MD  polyethylene glycol powder (GLYCOLAX/MIRALAX) 17 GM/SCOOP powder Dissolve 17 g in 4 oz of liquid and take by mouth daily. 07/10/23   Osvaldo Shipper, MD  potassium chloride (KLOR-CON) 20 MEQ packet Take 20 mEq by mouth daily. 07/10/23   Osvaldo Shipper, MD  prochlorperazine (COMPAZINE) 10 MG tablet Take 1 tablet (10 mg total) by mouth every 6 (six) hours as needed for nausea or vomiting. 07/11/23   Pasam, Archie Patten, MD  senna-docusate (SENOKOT-S) 8.6-50 MG tablet Take 2 tablets by mouth at bedtime. 07/10/23   Osvaldo Shipper, MD  Water For Irrigation, Sterile (FREE WATER) SOLN Place 100 mLs into  feeding tube 4 (four) times daily. 07/10/23   Osvaldo Shipper, MD  cetirizine (ZYRTEC) 10 MG tablet Take 1 tablet (10 mg total) by mouth daily. Patient not taking: Reported on 01/26/2019 02/23/18 07/26/20  Maczis, Elmer Sow, PA-C  fluticasone [facility]) 50 MCG/ACT nasal spray Place 1-2 sprays into both nostrils daily. Patient not taking: Reported on 01/26/2019 02/23/18 07/26/20  Jacinto Halim, PA-C      Allergies    Patient has no known allergies.    Review of Systems   Review of Systems  Cardiovascular:  Positive for chest pain.    Physical Exam Updated Vital Signs BP 109/60    Pulse 81   Temp 98.7 F (37.1 C) (Oral)   Resp 15   SpO2 97%  Physical Exam  ED Results / Procedures / Treatments   Labs (all labs ordered are listed, but only abnormal results are displayed) Labs Reviewed  BASIC METABOLIC PANEL - Abnormal; Notable for the following components:      Result Value   Sodium 132 (*)    Chloride 97 (*)    Glucose, Bld 107 (*)    Creatinine, Ser 0.59 (*)    Calcium 8.1 (*)    All other components within normal limits  CBC - Abnormal; Notable for the following components:   RBC 2.80 (*)    Hemoglobin 6.8 (*)    HCT 22.6 (*)    MCH 24.3 (*)    RDW 18.3 (*)    nRBC 0.3 (*)    All other components within normal limits  CBG MONITORING, ED - Abnormal; Notable for the following components:   Glucose-Capillary 102 (*)    All other components within normal limits  POC OCCULT BLOOD, ED - Abnormal; Notable for the following components:   Fecal Occult Bld POSITIVE (*)    All other components within normal limits  TYPE AND SCREEN  PREPARE RBC (CROSSMATCH)  TROPONIN I (HIGH SENSITIVITY)  TROPONIN I (HIGH SENSITIVITY)    EKG EKG Interpretation Date/Time:  Tuesday July 30 2023 09:14:10 EST Ventricular Rate:  88 PR Interval:  127 QRS Duration:  92 QT Interval:  343 QTC Calculation: 415 R Axis:   65  Text Interpretation: Sinus rhythm Baseline wander in lead(s) V2 No significant change since prior 12/24 Confirmed by Meridee Score [phone]) on 07/30/2023 9:19:24 AM  Radiology DG Chest 2 View Result Date: 07/30/2023 CLINICAL DATA:  Chest pain.  Lung cancer. EXAM: CHEST - 2 VIEW COMPARISON:  Chest radiograph dated 05/08/2023. FINDINGS: Right-sided Port-A-Cath with tip at the cavoatrial junction. Small bilateral pleural effusions and bibasilar atelectasis. No consolidative changes. No pneumothorax. The cardiac silhouette is within normal limits. No acute osseous pathology. IMPRESSION: Small bilateral pleural effusions and bibasilar atelectasis.  Electronically Signed   By: Elgie Collard M.D.   On: 07/30/2023 10:26    Procedures Procedures  {Document cardiac monitor, telemetry assessment procedure when appropriate:1}  Medications Ordered in ED Medications  0.9 %  sodium chloride infusion (Manually program via Guardrails IV Fluids) (has no administration in time range)  pantoprazole (PROTONIX) injection 40 mg (has no administration in time range)  ipratropium-albuterol (DUONEB) 0.5-2.5 (3) MG/3ML nebulizer solution 3 mL (3 mLs Nebulization Given 07/30/23 1017)  ondansetron (ZOFRAN) injection 4 mg (4 mg Intravenous Given 07/30/23 1017)  pantoprazole (PROTONIX) injection 40 mg (40 mg Intravenous Given 07/30/23 1150)    ED Course/ Medical Decision Making/ A&P   {  CRITICAL CARE Performed by: Bethann Berkshire Total critical care time: 45 minutes  Critical care time was exclusive of separately billable procedures and treating other patients. Critical care was necessary to treat or prevent imminent or life-threatening deterioration. Critical care was time spent personally by me on the following activities: development of treatment plan with patient and/or surrogate as well as nursing, discussions with consultants, evaluation of patient's response to treatment, examination of patient, obtaining history from patient or surrogate, ordering and performing treatments and interventions, ordering and review of laboratory studies, ordering and review of radiographic studies, pulse oximetry and re-evaluation of patient's condition.  Click here for ABCD2, HEART and other calculatorsREFRESH Note before signing :1}                              Medical Decision Making Amount and/or Complexity of Data Reviewed Labs: ordered.  Risk Prescription drug management. Decision regarding hospitalization.   Patient with stage IV GE junction adenoma and upper GI bleed with anemia.  He will be transfused and admitted to medicine with GI and oncology  consulting  {Document critical care time when appropriate:1} {Document review of labs and clinical decision tools ie heart score, Chads2Vasc2 etc:1}  {Document your independent review of radiology images, and any outside records:1} {Document your discussion with family members, caretakers, and with consultants:1} {Document social determinants of health affecting pt's care:1} {Document your decision making why or why not admission, treatments were needed:1} Final Clinical Impression(s) / ED Diagnoses Final diagnoses:  Upper GI bleed  Iron deficiency anemia due to chronic blood loss    Rx / DC Orders ED Discharge Orders     None

## 2023-07-30 NOTE — ED Notes (Signed)
Blood consent signed electronically. ?

## 2023-07-30 NOTE — Consult Note (Signed)
[facility] Gastroenterology Consult  Referring Provider: No ref. provider found Primary Care Physician:  Ivonne Andrew, NP Primary Gastroenterologist: Gentry Fitz  Reason for Consultation: Heme positive stools, anemia  SUBJECTIVE:   HPI: Jesus Haynes is a 48 y.o. 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  His functional status was previously in decline limiting his ability to start chemotherapy, a J-tube was placed for feeding which improved his functional status and chemotherapy was started on 07/23/2023.  Last CT imaging 07/03/2023 showed infiltrative soft tissue mass in the distal esophagus/proximal stomach, posterior mediastinal adenopathy consistent with metastatic disease, GE junction mass involving superior margin of the pancreatic body.  Patient presented to hospital on 07/30/2023 with chief complaint of chest pain and shortness of breath.  Labs showed hemoglobin 6.8 (was 8.0 on 07/25/2023), WBC 6.5, platelet 290, BUN/creatinine 10/0.59.  He noted that he has been experiencing some dark and tarry stools for the past few days.  He has some chest discomfort and epigastric region.  He has been able to tolerate oral intake, mostly snacks.  He is supplementing his diet with J-tube feeds.  No constipation or diarrhea.  In process of receiving blood transfusion during my evaluation of him.  Past Medical History:  Diagnosis Date   Asthma    Past Surgical History:  Procedure Laterality Date   BIOPSY  05/28/2023   Procedure: BIOPSY;  Surgeon: Vida Rigger, MD;  Location: [facility];  Service: Gastroenterology;;   ESOPHAGOGASTRODUODENOSCOPY (EGD) WITH PROPOFOL N/A 05/28/2023   Procedure: ESOPHAGOGASTRODUODENOSCOPY (EGD) WITH PROPOFOL;  Surgeon: Vida Rigger, MD;  Location: [facility];  Service: Gastroenterology;  Laterality: N/A;   IR IMAGING GUIDED PORT INSERTION  06/13/2023    UMBILICAL HERNIA REPAIR N/A 06/18/2023   Procedure: PLACEMENT OF J TUBE;  Surgeon: Fritzi Mandes, MD;  Location: [facility] ORS;  Service: General;  Laterality: N/A;   Prior to Admission medications   Medication Sig Start Date End Date Taking? Authorizing Provider  albuterol (PROVENTIL) (5 MG/ML) 0.5% nebulizer solution Take 0.5 mLs (2.5 mg total) by nebulization every 6 (six) hours as needed for wheezing or shortness of breath. 04/28/23   Roxy Horseman, PA-C  albuterol (VENTOLIN HFA) 108 (90 Base) MCG/ACT inhaler Inhale 1-2 puffs into the lungs every 4 (four) hours as needed for wheezing or shortness of breath. 06/20/22   Mare Ferrari, PA-C  cyanocobalamin 1000 MCG tablet Take 1 tablet (1,000 mcg total) by mouth daily. 07/10/23   Osvaldo Shipper, MD  dexamethasone (DECADRON) 4 MG tablet Take 2 tablets (8 mg total) by mouth daily. Start the day after chemotherapy for 2 days. Take with food. 07/11/23   Pasam, Archie Patten, MD  folic acid (FOLVITE) 1 MG tablet Take 1 tablet (1 mg total) by mouth daily. 07/10/23   Osvaldo Shipper, MD  furosemide (LASIX) 20 MG tablet Take 1 tablet (20 mg total) by mouth daily. 07/29/23   Ivonne Andrew, NP  HYDROmorphone (DILAUDID) 2 MG tablet Take 1 tablet (2 mg total) by mouth every 4 (four) hours as needed for severe pain (pain score 7-10). 08/05/23   Pickenpack-Cousar, Arty Baumgartner, NP  lidocaine-prilocaine (EMLA) cream Apply to affected area once 07/11/23   Pasam, Avinash, MD  meclizine (ANTIVERT) 25 MG tablet Take 1 tablet (25 mg total) by mouth 3 (three) times daily as needed for dizziness. 07/10/23   Osvaldo Shipper, MD  methadone (DOLOPHINE) 5 MG tablet  Take 1 tablet (5 mg total) by mouth every 8 (eight) hours. 07/18/23   Pickenpack-Cousar, Arty Baumgartner, NP  metoCLOPramide (REGLAN) 10 MG tablet Take 1 tablet (10 mg total) by mouth 4 (four) times daily -  before meals and at bedtime. 07/10/23   Osvaldo Shipper, MD  Nutritional Supplements (FEEDING SUPPLEMENT, OSMOLITE 1.5 CAL,) LIQD At  60 mL/h over 18 hours on a daily basis. 07/10/23   Osvaldo Shipper, MD  ondansetron (ZOFRAN) 8 MG tablet Take 1 tablet (8 mg total) by mouth every 8 (eight) hours as needed for nausea or vomiting. Start on the third day after chemotherapy. Patient not taking: Reported on 07/18/2023 07/11/23   Pasam, Archie Patten, MD  pantoprazole (PROTONIX) 40 MG tablet Take 1 tablet (40 mg total) by mouth 2 (two) times daily. 07/10/23 09/08/23  Osvaldo Shipper, MD  polyethylene glycol powder (GLYCOLAX/MIRALAX) 17 GM/SCOOP powder Dissolve 17 g in 4 oz of liquid and take by mouth daily. 07/10/23   Osvaldo Shipper, MD  potassium chloride (KLOR-CON) 20 MEQ packet Take 20 mEq by mouth daily. 07/10/23   Osvaldo Shipper, MD  prochlorperazine (COMPAZINE) 10 MG tablet Take 1 tablet (10 mg total) by mouth every 6 (six) hours as needed for nausea or vomiting. 07/11/23   Pasam, Archie Patten, MD  senna-docusate (SENOKOT-S) 8.6-50 MG tablet Take 2 tablets by mouth at bedtime. 07/10/23   Osvaldo Shipper, MD  Water For Irrigation, Sterile (FREE WATER) SOLN Place 100 mLs into feeding tube 4 (four) times daily. 07/10/23   Osvaldo Shipper, MD  cetirizine (ZYRTEC) 10 MG tablet Take 1 tablet (10 mg total) by mouth daily. Patient not taking: Reported on 01/26/2019 02/23/18 07/26/20  Maczis, Elmer Sow, PA-C  fluticasone [facility]) 50 MCG/ACT nasal spray Place 1-2 sprays into both nostrils daily. Patient not taking: Reported on 01/26/2019 02/23/18 07/26/20  Jacinto Halim, PA-C   Current Facility-Administered Medications  Medication Dose Route Frequency Provider Last Rate Last Admin   acetaminophen (TYLENOL) tablet 650 mg  650 mg Oral Q6H PRN Bobette Mo, MD       Or   acetaminophen (TYLENOL) suppository 650 mg  650 mg Rectal Q6H PRN Bobette Mo, MD       ondansetron [facility]) tablet 4 mg  4 mg Oral Q6H PRN Bobette Mo, MD       Or   ondansetron [facility]) injection 4 mg  4 mg Intravenous Q6H PRN Bobette Mo, MD       pantoprazole  (PROTONIX) injection 40 mg  40 mg Intravenous Q12H Bobette Mo, MD       Current Outpatient Medications  Medication Sig Dispense Refill   albuterol (PROVENTIL) (5 MG/ML) 0.5% nebulizer solution Take 0.5 mLs (2.5 mg total) by nebulization every 6 (six) hours as needed for wheezing or shortness of breath. 20 mL 3   albuterol (VENTOLIN HFA) 108 (90 Base) MCG/ACT inhaler Inhale 1-2 puffs into the lungs every 4 (four) hours as needed for wheezing or shortness of breath. 18 g 3   cyanocobalamin 1000 MCG tablet Take 1 tablet (1,000 mcg total) by mouth daily. 30 tablet 2   dexamethasone (DECADRON) 4 MG tablet Take 2 tablets (8 mg total) by mouth daily. Start the day after chemotherapy for 2 days. Take with food. 30 tablet 1   folic acid (FOLVITE) 1 MG tablet Take 1 tablet (1 mg total) by mouth daily. 30 tablet 2   furosemide (LASIX) 20 MG tablet Take 1 tablet (20 mg total) by mouth daily.  4 tablet 0   [START ON 08/05/2023] HYDROmorphone (DILAUDID) 2 MG tablet Take 1 tablet (2 mg total) by mouth every 4 (four) hours as needed for severe pain (pain score 7-10). 90 tablet 0   lidocaine-prilocaine (EMLA) cream Apply to affected area once 30 g 3   meclizine (ANTIVERT) 25 MG tablet Take 1 tablet (25 mg total) by mouth 3 (three) times daily as needed for dizziness. 30 tablet 0   methadone (DOLOPHINE) 5 MG tablet Take 1 tablet (5 mg total) by mouth every 8 (eight) hours.     metoCLOPramide (REGLAN) 10 MG tablet Take 1 tablet (10 mg total) by mouth 4 (four) times daily -  before meals and at bedtime. 120 tablet 1   Nutritional Supplements (FEEDING SUPPLEMENT, OSMOLITE 1.5 CAL,) LIQD At 60 mL/h over 18 hours on a daily basis. 30000 mL 0   ondansetron (ZOFRAN) 8 MG tablet Take 1 tablet (8 mg total) by mouth every 8 (eight) hours as needed for nausea or vomiting. Start on the third day after chemotherapy. (Patient not taking: Reported on 07/18/2023) 30 tablet 1   pantoprazole (PROTONIX) 40 MG tablet Take 1 tablet  (40 mg total) by mouth 2 (two) times daily. 60 tablet 1   polyethylene glycol powder (GLYCOLAX/MIRALAX) 17 GM/SCOOP powder Dissolve 17 g in 4 oz of liquid and take by mouth daily. 476 g 0   potassium chloride (KLOR-CON) 20 MEQ packet Take 20 mEq by mouth daily. 30 packet 0   prochlorperazine (COMPAZINE) 10 MG tablet Take 1 tablet (10 mg total) by mouth every 6 (six) hours as needed for nausea or vomiting. 30 tablet 1   senna-docusate (SENOKOT-S) 8.6-50 MG tablet Take 2 tablets by mouth at bedtime. 60 tablet 2   Water For Irrigation, Sterile (FREE WATER) SOLN Place 100 mLs into feeding tube 4 (four) times daily. 12000 mL 1   Allergies as of 07/30/2023   (No Known Allergies)   Family History  Problem Relation Age of Onset   Heart failure Father    Social History   Socioeconomic History   Marital status: Single    Spouse name: Not on file   Number of children: Not on file   Years of education: Not on file   Highest education level: Not on file  Occupational History   Not on file  Tobacco Use   Smoking status: Former    Current packs/day: 0.00    Types: Cigarettes    Quit date: 12/26/2012    Years since quitting: 10.5   Smokeless tobacco: Never  Vaping Use   Vaping status: Former   Quit date: 08/21/2018   Substances: Nicotine  Substance and Sexual Activity   Alcohol use: No   Drug use: No   Sexual activity: Not Currently  Other Topics Concern   Not on file  Social History Narrative   Not on file   Social Drivers of Health   Financial Resource Strain: Not on file  Food Insecurity: No Food Insecurity (07/30/2023)   Hunger Vital Sign    Worried About Running Out of Food in the Last Year: Never true    Ran Out of Food in the Last Year: Never true  Transportation Needs: No Transportation Needs (07/30/2023)   PRAPARE - Administrator, Civil Service (Medical): No    Lack of Transportation (Non-Medical): No  Physical Activity: Not on file  Stress: Not on file   Social Connections: Unknown (07/30/2023)   Social Connection and Isolation Panel [  NHANES]    Frequency of Communication with Friends and Family: More than three times a week    Frequency of Social Gatherings with Friends and Family: More than three times a week    Attends Religious Services: Patient declined    Database administrator or Organizations: No    Attends Banker Meetings: Never    Marital Status: Never married  Intimate Partner Violence: Not At Risk (07/30/2023)   Humiliation, Afraid, Rape, and Kick questionnaire    Fear of Current or Ex-Partner: No    Emotionally Abused: No    Physically Abused: No    Sexually Abused: No   Review of Systems:  Review of Systems  Respiratory:  Positive for shortness of breath.   Cardiovascular:  Positive for chest pain.  Gastrointestinal:  Positive for abdominal pain, melena and nausea. Negative for vomiting.    OBJECTIVE:   Temp:  [98.4 F (36.9 C)-98.7 F (37.1 C)] 98.4 F (36.9 C) (01/28 1232) Pulse Rate:  [81-95] 85 (01/28 1232) Resp:  [15-18] 18 (01/28 1232) BP: (106-123)/(59-75) 107/59 (01/28 1232) SpO2:  [97 %-100 %] 98 % (01/28 1232)   Physical Exam Constitutional:      General: He is not in acute distress.    Appearance: He is not ill-appearing, toxic-appearing or diaphoretic.  Cardiovascular:     Rate and Rhythm: Normal rate and regular rhythm.  Pulmonary:     Effort: No respiratory distress.     Breath sounds: Normal breath sounds.     Comments: No supplemental oxygen in place Abdominal:     General: Bowel sounds are normal. There is no distension.     Palpations: Abdomen is soft.     Tenderness: There is abdominal tenderness. There is no guarding.     Comments: J-tube appreciated  Skin:    General: Skin is warm and dry.  Neurological:     Mental Status: He is alert.     Labs: Recent Labs    07/30/23 1010  WBC 6.5  HGB 6.8*  HCT 22.6*  PLT 290   BMET Recent Labs    07/30/23 1010  NA  132*  K 3.6  CL 97*  CO2 26  GLUCOSE 107*  BUN 10  CREATININE 0.59*  CALCIUM 8.1*   LFT No results for input(s): "PROT", "ALBUMIN", "AST", "ALT", "ALKPHOS", "BILITOT", "BILIDIR", "IBILI" in the last 72 hours. PT/INR No results for input(s): "LABPROT", "INR" in the last 72 hours.  Diagnostic imaging: DG Chest 2 View Result Date: 07/30/2023 CLINICAL DATA:  Chest pain.  Lung cancer. EXAM: CHEST - 2 VIEW COMPARISON:  Chest radiograph dated 05/08/2023. FINDINGS: Right-sided Port-A-Cath with tip at the cavoatrial junction. Small bilateral pleural effusions and bibasilar atelectasis. No consolidative changes. No pneumothorax. The cardiac silhouette is within normal limits. No acute osseous pathology. IMPRESSION: Small bilateral pleural effusions and bibasilar atelectasis. Electronically Signed   By: Elgie Collard M.D.   On: 07/30/2023 10:26    IMPRESSION: Melena Blood loss anemia, baseline hemoglobin appears to be around 8.0 History of metastatic, invasive adenocarcinoma of GE junction  -Chemotherapy started roughly 1 week prior on 07/23/2023 History of protein calorie malnutrition requiring J-tube feedings  PLAN: -Melena and blood loss anemia likely from GE junction adenocarcinoma, endoscopic intervention would have limited result in treatment as mass is likely oozing/responding to chemotherapy -Currently undergoing blood product transfusion, trend H/H, transfuse for hemoglobin less than 7 -IV PPI every 12 hours -Add Carafate solution 1 g 4 times daily to  help heal any ulcerations -Okay for diet as tolerated from GI perspective -If melena/anemia were to significantly worsen could consider EGD, though again would have limited utility/benefit -Palliative care following in outpatient setting, hospice has been discussed with patient, appropriate -[facility] will follow   LOS: 0 days   [name], [facility] Gastroenterology

## 2023-07-30 NOTE — Progress Notes (Signed)
Family has requested assistance with home health services. [name] is the preferred agency. Also, there are forms located on patient's chart that the family is requesting to be completed to assist with getting home health aides through [facility].

## 2023-07-31 ENCOUNTER — Other Ambulatory Visit: Payer: Self-pay

## 2023-07-31 ENCOUNTER — Other Ambulatory Visit: Payer: Medicaid Other

## 2023-07-31 ENCOUNTER — Ambulatory Visit: Payer: Medicaid Other

## 2023-07-31 DIAGNOSIS — E871 Hypo-osmolality and hyponatremia: Secondary | ICD-10-CM | POA: Diagnosis present

## 2023-07-31 DIAGNOSIS — J45909 Unspecified asthma, uncomplicated: Secondary | ICD-10-CM | POA: Diagnosis present

## 2023-07-31 DIAGNOSIS — G893 Neoplasm related pain (acute) (chronic): Secondary | ICD-10-CM | POA: Diagnosis not present

## 2023-07-31 DIAGNOSIS — Z87891 Personal history of nicotine dependence: Secondary | ICD-10-CM | POA: Diagnosis not present

## 2023-07-31 DIAGNOSIS — D62 Acute posthemorrhagic anemia: Secondary | ICD-10-CM | POA: Diagnosis present

## 2023-07-31 DIAGNOSIS — R079 Chest pain, unspecified: Secondary | ICD-10-CM | POA: Diagnosis present

## 2023-07-31 DIAGNOSIS — Z6828 Body mass index (BMI) 28.0-28.9, adult: Secondary | ICD-10-CM | POA: Diagnosis not present

## 2023-07-31 DIAGNOSIS — Z934 Other artificial openings of gastrointestinal tract status: Secondary | ICD-10-CM | POA: Diagnosis not present

## 2023-07-31 DIAGNOSIS — J9811 Atelectasis: Secondary | ICD-10-CM | POA: Diagnosis present

## 2023-07-31 DIAGNOSIS — Z8249 Family history of ischemic heart disease and other diseases of the circulatory system: Secondary | ICD-10-CM | POA: Diagnosis not present

## 2023-07-31 DIAGNOSIS — K5900 Constipation, unspecified: Secondary | ICD-10-CM | POA: Diagnosis present

## 2023-07-31 DIAGNOSIS — Z85028 Personal history of other malignant neoplasm of stomach: Secondary | ICD-10-CM | POA: Diagnosis not present

## 2023-07-31 DIAGNOSIS — Z79899 Other long term (current) drug therapy: Secondary | ICD-10-CM | POA: Diagnosis not present

## 2023-07-31 DIAGNOSIS — E43 Unspecified severe protein-calorie malnutrition: Secondary | ICD-10-CM | POA: Diagnosis present

## 2023-07-31 DIAGNOSIS — C160 Malignant neoplasm of cardia: Secondary | ICD-10-CM | POA: Diagnosis present

## 2023-07-31 LAB — BPAM RBC
Blood Product Expiration Date: 202503022359
Blood Product Expiration Date: 202503022359
ISSUE DATE / TIME: 202501281213
ISSUE DATE / TIME: 202501281727
Unit Type and Rh: 5100
Unit Type and Rh: 5100

## 2023-07-31 LAB — CBC
HCT: 29.3 % — ABNORMAL LOW (ref 39.0–52.0)
Hemoglobin: 9.1 g/dL — ABNORMAL LOW (ref 13.0–17.0)
MCH: 24.9 pg — ABNORMAL LOW (ref 26.0–34.0)
MCHC: 31.1 g/dL (ref 30.0–36.0)
MCV: 80.1 fL (ref 80.0–100.0)
Platelets: 273 10*3/uL (ref 150–400)
RBC: 3.66 MIL/uL — ABNORMAL LOW (ref 4.22–5.81)
RDW: 18.7 % — ABNORMAL HIGH (ref 11.5–15.5)
WBC: 9 10*3/uL (ref 4.0–10.5)
nRBC: 0 % (ref 0.0–0.2)

## 2023-07-31 LAB — TYPE AND SCREEN
ABO/RH(D): O POS
Antibody Screen: NEGATIVE
Unit division: 0
Unit division: 0

## 2023-07-31 LAB — COMPREHENSIVE METABOLIC PANEL
ALT: 24 U/L (ref 0–44)
AST: 21 U/L (ref 15–41)
Albumin: 2.4 g/dL — ABNORMAL LOW (ref 3.5–5.0)
Alkaline Phosphatase: 78 U/L (ref 38–126)
Anion gap: 8 (ref 5–15)
BUN: 8 mg/dL (ref 6–20)
CO2: 26 mmol/L (ref 22–32)
Calcium: 8.1 mg/dL — ABNORMAL LOW (ref 8.9–10.3)
Chloride: 101 mmol/L (ref 98–111)
Creatinine, Ser: 0.73 mg/dL (ref 0.61–1.24)
GFR, Estimated: >60 mL/min (ref >60)
Glucose, Bld: 167 mg/dL — ABNORMAL HIGH (ref 70–99)
Potassium: 3.7 mmol/L (ref 3.5–5.1)
Sodium: 135 mmol/L (ref 135–145)
Total Bilirubin: 0.7 mg/dL (ref 0.0–1.2)
Total Protein: 5.3 g/dL — ABNORMAL LOW (ref 6.5–8.1)

## 2023-07-31 MED ORDER — ORAL CARE MOUTH RINSE
15.0000 mL | OROMUCOSAL | Status: DC
  Administered 2023-07-31 – 2023-08-01 (×7): 15 mL via OROMUCOSAL

## 2023-07-31 MED ORDER — ORAL CARE MOUTH RINSE: 15.0000 mL | OROMUCOSAL | Status: DC | PRN

## 2023-07-31 NOTE — Progress Notes (Signed)
[facility] Progress Note  SUBJECTIVE:   Interval history: Jesus Haynes was seen and evaluated today at bedside. Brother in law present at bedside. Vashawn somewhat somnolent, though awakens to spoken name and answers questions appropriately. Noted having melanic stool yesterday, none today. He is on J tube feeds and tolerating. Some nausea, no vomiting. Having abdominal pain. No chest pain or shortness of breath.  Past Medical History:  Diagnosis Date   Asthma    Past Surgical History:  Procedure Laterality Date   BIOPSY  05/28/2023   Procedure: BIOPSY;  Surgeon: Vida Rigger, MD;  [facility]: [facility];  Service: Gastroenterology;;   ESOPHAGOGASTRODUODENOSCOPY (EGD) WITH PROPOFOL N/A 05/28/2023   Procedure: ESOPHAGOGASTRODUODENOSCOPY (EGD) WITH PROPOFOL;  Surgeon: Vida Rigger, MD;  [facility];  Service: Gastroenterology;  Laterality: N/A;   IR IMAGING GUIDED PORT INSERTION  06/13/2023   UMBILICAL HERNIA REPAIR N/A 06/18/2023   Procedure: PLACEMENT OF J TUBE;  Surgeon: Fritzi Mandes, MD;  Location: [facility];  Service: General;  Laterality: N/A;   Current Facility-Administered Medications  Medication Dose Route Frequency Provider Last Rate Last Admin   acetaminophen (TYLENOL) tablet 650 mg  650 mg Oral Q6H PRN Bobette Mo, MD       Or   acetaminophen (TYLENOL) suppository 650 mg  650 mg Rectal Q6H PRN Bobette Mo, MD       feeding supplement (OSMOLITE 1.5 CAL) liquid 1,000 mL  1,000 mL Per Tube Q18H Bobette Mo, MD   1,000 mL at 07/31/23 0857   free water 100 mL  100 mL Per Tube QID Bobette Mo, MD   100 mL at 07/31/23 0857   furosemide (LASIX) tablet 20 mg  20 mg Per Tube Daily Bobette Mo, MD   20 mg at 07/31/23 2841   HYDROmorphone (DILAUDID) tablet 2 mg  2 mg Per Tube Q4H PRN Bobette Mo, MD   2 mg at 07/31/23 1000   ipratropium-albuterol (DUONEB) 0.5-2.5 (3) MG/3ML nebulizer solution 3 mL  3 mL Nebulization Q4H  PRN Luiz Iron, NP   3 mL at 07/31/23 0140   methadone (DOLOPHINE) tablet 5 mg  5 mg Per Tube Q12H Bobette Mo, MD   5 mg at 07/31/23 0855   ondansetron (ZOFRAN) tablet 4 mg  4 mg Oral Q6H PRN Bobette Mo, MD       Or   ondansetron [facility]) injection 4 mg  4 mg Intravenous Q6H PRN Bobette Mo, MD       Oral care mouth rinse  15 mL Mouth Rinse 4 times per day Bobette Mo, MD   15 mL at 07/31/23 3244   Oral care mouth rinse  15 mL Mouth Rinse PRN Bobette Mo, MD       pantoprazole (PROTONIX) injection 40 mg  40 mg Intravenous Q12H Bobette Mo, MD   40 mg at 07/31/23 0855   polyethylene glycol (MIRALAX / GLYCOLAX) packet 17 g  17 g Per Tube Daily Bobette Mo, MD   17 g at 07/31/23 0856   potassium chloride (KLOR-CON) packet 20 mEq  20 mEq Per Tube Daily Bobette Mo, MD   20 mEq at 07/31/23 0856   sucralfate (CARAFATE) 1 GM/10ML suspension 1 g  1 g Oral TID WC & HS [name] H, DO   1 g at 07/31/23 0102   Allergies as of 07/30/2023   (No Known Allergies)   Review of Systems:  Review of Systems  Respiratory:  Negative for shortness of breath.   Cardiovascular:  Negative for chest pain.  Gastrointestinal:  Positive for abdominal pain, melena and nausea. Negative for vomiting.    OBJECTIVE:   Temp:  [98 F (36.7 C)-98.8 F (37.1 C)] 98.8 F (37.1 C) (01/29 0526) Pulse Rate:  [81-93] 93 (01/29 0526) Resp:  [10-24] 20 (01/29 0526) BP: (106-126)/(59-80) 122/80 (01/29 0526) SpO2:  [93 %-100 %] 94 % (01/29 0526) Weight:  [80 kg] 80 kg (01/28 1931) Last BM Date : 07/30/23 Physical Exam Constitutional:      General: He is not in acute distress.    Appearance: He is not ill-appearing, toxic-appearing or diaphoretic.  Cardiovascular:     Rate and Rhythm: Normal rate and regular rhythm.  Pulmonary:     Effort: No respiratory distress.     Breath sounds: Normal breath sounds.  Abdominal:     General: Bowel sounds  are normal. There is no distension.     Palpations: Abdomen is soft.     Tenderness: There is abdominal tenderness (diffuse).  Neurological:     Mental Status: He is lethargic.     Labs: Recent Labs    07/30/23 1010 07/31/23 0425  WBC 6.5 9.0  HGB 6.8* 9.1*  HCT 22.6* 29.3*  PLT 290 273   BMET Recent Labs    07/30/23 1010 07/31/23 0425  NA 132* 135  K 3.6 3.7  CL 97* 101  CO2 26 26  GLUCOSE 107* 167*  BUN 10 8  CREATININE 0.59* 0.73  CALCIUM 8.1* 8.1*   LFT Recent Labs    07/31/23 0425  PROT 5.3*  ALBUMIN 2.4*  AST 21  ALT 24  ALKPHOS 78  BILITOT 0.7   PT/INR No results for input(s): "LABPROT", "INR" in the last 72 hours. Diagnostic imaging: DG Chest 2 View Result Date: 07/30/2023 CLINICAL DATA:  Chest pain.  Lung cancer. EXAM: CHEST - 2 VIEW COMPARISON:  Chest radiograph dated 05/08/2023. FINDINGS: Right-sided Port-A-Cath with tip at the cavoatrial junction. Small bilateral pleural effusions and bibasilar atelectasis. No consolidative changes. No pneumothorax. The cardiac silhouette is within normal limits. No acute osseous pathology. IMPRESSION: Small bilateral pleural effusions and bibasilar atelectasis. Electronically Signed   By: Elgie Collard M.D.   On: 07/30/2023 10:26   IMPRESSION: Melena Blood loss anemia, baseline hemoglobin appears to be around 8.0 History of metastatic, invasive adenocarcinoma of GE junction             -Chemotherapy started roughly 1 week prior on 07/23/2023 History of protein calorie malnutrition requiring J-tube feedings   PLAN: -Hgb stable today, last BM yesterday, tube feeds running, pain control on-board -Likely source of melena is known malignancy after having started chemotherapy, on PPI therapy and sucralfate therapy -Diet as tolerated -Could consider EGD for intractable anemia/melena, though endoscopy would not provide significant benefit for known malignancy -[facility] will sign off and be available as needed should  questions arise   LOS: 0 days   [name], DO [facility] Gastroenterology

## 2023-07-31 NOTE — Progress Notes (Signed)
[facility] ROUNDING   NOTE Jesus Haynes ZOX:[id number]  DOB: 1975/12/26  DOA: 07/30/2023  PCP: Ivonne Andrew, NP  07/31/2023,8:07 AM   LOS: 0 days      Code Status: Full From: Home  current Dispo: Likely   48 year old male known asthma 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 Arlana Pouch 1/28 present [facility] ED central chest pain epigastric in nature SOB-hemoglobin down from 8-6.8 dark tarry stool tolerating some diet--GI consulted-palliative consulted  procedures   Plan  GE junction bleeding stage IV adenocarcinoma with anemia of acute blood loss Transfused X1 1/28 secondary to bleeding GI has seen and do not feel scope would change management-if however continues to bleed we will ask them to see again Mainstay of treatment would be FOLFOX and nivolumab-CC Dr. Arlana Pouch To ensure that he is aware Malnutrition with feeding jejunostomy Mainly would feed him with jejunostomy feeds which would bypass the GE junction Await reaching goal and then can probably discharge home  DVT prophylaxis: Contraindicated SCD  Status is: Observation The patient remains OBS appropriate and will d/c before 2 midnights.   Discussed with the patient's brother at the bedside   Subjective: Quite sleepy moderate pain-he has no distress No nausea no vomiting He is only having fluids by mouth now  Objective + exam Vitals:   07/30/23 1931 07/30/23 2208 07/31/23 0140 07/31/23 0526  BP:  117/76  122/80  Pulse:  91  93  Resp:  (!) 24  20  Temp:  98.5 F (36.9 C)  98.8 F (37.1 C)  TempSrc:  Oral  Oral  SpO2:  94% 93% 94%  Weight: 80 kg     Height:       Filed Weights   07/30/23 1931  Weight: 80 kg    Examination: Awake coherent no distress looks well feels fair no nausea no vomiting Kind of sleepy S1-S2 no murmur Chest clear Abdomen  soft jejunostomy however is not well-approximated to the stomach No lower extremity edema Moving 4 limbs  Data Reviewed: reviewed   CBC    Component Value Date/Time   WBC 9.0 07/31/2023 0425   RBC 3.66 (L) 07/31/2023 0425   HGB 9.1 (L) 07/31/2023 0425   HGB 8.0 (L) 07/25/2023 1120   HGB 10.5 (L) 06/12/2023 1413   HCT 29.3 (L) 07/31/2023 0425   HCT 34.2 (L) 06/12/2023 1413   PLT 273 07/31/2023 0425   PLT 477 (H) 07/25/2023 1120   PLT 543 (H) 06/12/2023 1413   MCV 80.1 07/31/2023 0425   MCV 82 06/12/2023 1413   MCH 24.9 (L) 07/31/2023 0425   MCHC 31.1 07/31/2023 0425   RDW 18.7 (H) 07/31/2023 0425   RDW 13.6 06/12/2023 1413   LYMPHSABS 0.5 (L) 07/25/2023 1120   MONOABS 0.1 07/25/2023 1120   EOSABS 0.2 07/25/2023 1120   BASOSABS 0.0 07/25/2023 1120      Latest Ref Rng & Units 07/31/2023    4:25 AM 07/30/2023   10:10 AM 07/18/2023   10:17 AM  CMP  Glucose 70 - 99 mg/dL 811  914  782   BUN 6 - 20 mg/dL 8  10  9    Creatinine 0.61 - 1.24 mg/dL 9.56  2.13  0.86   Sodium 135 - 145 mmol/L 135  132  134   Potassium 3.5 - 5.1 mmol/L 3.7  3.6  4.0   Chloride  98 - 111 mmol/L 101  97  100   CO2 22 - 32 mmol/L 26  26  27    Calcium 8.9 - 10.3 mg/dL 8.1  8.1  8.9   Total Protein 6.5 - 8.1 g/dL 5.3   6.4   Total Bilirubin 0.0 - 1.2 mg/dL 0.7   0.4   Alkaline Phos 38 - 126 U/L 78   119   AST 15 - 41 U/L 21   20   ALT 0 - 44 U/L 24   20     Scheduled Meds:  feeding supplement (OSMOLITE 1.5 CAL)  1,000 mL Per Tube Q18H   free water  100 mL Per Tube QID   furosemide  20 mg Per Tube Daily   methadone  5 mg Per Tube Q12H   mouth rinse  15 mL Mouth Rinse 4 times per day   pantoprazole (PROTONIX) IV  40 mg Intravenous Q12H   polyethylene glycol  17 g Per Tube Daily   potassium chloride  20 mEq Per Tube Daily   sucralfate  1 g Oral TID WC & HS   Continuous Infusions:  Time  55  Rhetta Mura, MD  [facility]

## 2023-07-31 NOTE — Progress Notes (Signed)
The pt's daughters are voicing concerns over some medications the pt is taking and not disclosing to the medical team. The pt has been dosing himself with Ivermectin and a dog dewormer in an attempt to control his cancer. The pt voices that they have not taken either medication in a couple weeks and not since they have been in the hospital. Pt educated on the importance in adhering to medical advice and not taking prescription drugs that are not prescribed by their doctor. Charge nurse notified, MD notified. Additional education is needed for the pt.

## 2023-08-01 ENCOUNTER — Ambulatory Visit: Payer: Medicaid Other | Admitting: Oncology

## 2023-08-01 ENCOUNTER — Other Ambulatory Visit: Payer: Medicaid Other

## 2023-08-01 DIAGNOSIS — C160 Malignant neoplasm of cardia: Secondary | ICD-10-CM | POA: Diagnosis not present

## 2023-08-01 DIAGNOSIS — G893 Neoplasm related pain (acute) (chronic): Secondary | ICD-10-CM

## 2023-08-01 DIAGNOSIS — D62 Acute posthemorrhagic anemia: Secondary | ICD-10-CM | POA: Diagnosis not present

## 2023-08-01 LAB — CBC WITH DIFFERENTIAL/PLATELET
Abs Immature Granulocytes: 0.03 10*3/uL (ref 0.00–0.07)
Basophils Absolute: 0 10*3/uL (ref 0.0–0.1)
Basophils Relative: 0 %
Eosinophils Absolute: 0.2 10*3/uL (ref 0.0–0.5)
Eosinophils Relative: 3 %
HCT: 29.7 % — ABNORMAL LOW (ref 39.0–52.0)
Hemoglobin: 8.9 g/dL — ABNORMAL LOW (ref 13.0–17.0)
Immature Granulocytes: 1 %
Lymphocytes Relative: 17 %
Lymphs Abs: 1.2 10*3/uL (ref 0.7–4.0)
MCH: 24.5 pg — ABNORMAL LOW (ref 26.0–34.0)
MCHC: 30 g/dL (ref 30.0–36.0)
MCV: 81.8 fL (ref 80.0–100.0)
Monocytes Absolute: 0.7 10*3/uL (ref 0.1–1.0)
Monocytes Relative: 11 %
Neutro Abs: 4.6 10*3/uL (ref 1.7–7.7)
Neutrophils Relative %: 68 %
Platelets: 261 10*3/uL (ref 150–400)
RBC: 3.63 MIL/uL — ABNORMAL LOW (ref 4.22–5.81)
RDW: 18.6 % — ABNORMAL HIGH (ref 11.5–15.5)
WBC: 6.7 10*3/uL (ref 4.0–10.5)
nRBC: 0 % (ref 0.0–0.2)

## 2023-08-01 LAB — BASIC METABOLIC PANEL
Anion gap: 5 (ref 5–15)
BUN: 9 mg/dL (ref 6–20)
CO2: 26 mmol/L (ref 22–32)
Calcium: 7.7 mg/dL — ABNORMAL LOW (ref 8.9–10.3)
Chloride: 103 mmol/L (ref 98–111)
Creatinine, Ser: 0.59 mg/dL — ABNORMAL LOW (ref 0.61–1.24)
GFR, Estimated: >60 mL/min (ref >60)
Glucose, Bld: 141 mg/dL — ABNORMAL HIGH (ref 70–99)
Potassium: 3.9 mmol/L (ref 3.5–5.1)
Sodium: 134 mmol/L — ABNORMAL LOW (ref 135–145)

## 2023-08-01 LAB — GLUCOSE, CAPILLARY
Glucose-Capillary: 120 mg/dL — ABNORMAL HIGH (ref 70–99)
Glucose-Capillary: 120 mg/dL — ABNORMAL HIGH (ref 70–99)

## 2023-08-01 LAB — PHOSPHORUS: Phosphorus: 2.5 mg/dL (ref 2.5–4.6)

## 2023-08-01 LAB — MAGNESIUM: Magnesium: 2.1 mg/dL (ref 1.7–2.4)

## 2023-08-01 MED ORDER — HYDROMORPHONE HCL 1 MG/ML PO LIQD
2.0000 mg | ORAL | Status: DC
  Administered 2023-08-01 – 2023-08-02 (×4): 2 mg
  Filled 2023-08-01 (×4): qty 2

## 2023-08-01 MED ORDER — HYDROMORPHONE HCL 1 MG/ML IJ SOLN
1.0000 mg | Freq: Once | INTRAMUSCULAR | Status: AC
  Administered 2023-08-01: 1 mg via INTRAVENOUS
  Filled 2023-08-01: qty 1

## 2023-08-01 MED ORDER — ACETAMINOPHEN 650 MG RE SUPP: 650.0000 mg | Freq: Four times a day (QID) | RECTAL | Status: DC | PRN

## 2023-08-01 MED ORDER — ACETAMINOPHEN 160 MG/5ML PO SOLN: 650.0000 mg | Freq: Four times a day (QID) | ORAL | Status: DC | PRN

## 2023-08-01 MED ORDER — OSMOLITE 1.5 CAL PO LIQD
1080.0000 mL | ORAL | Status: DC
  Administered 2023-08-01: 1080 mL
  Filled 2023-08-01 (×2): qty 1185

## 2023-08-01 MED ORDER — FUROSEMIDE 10 MG/ML PO SOLN
20.0000 mg | Freq: Every day | ORAL | Status: DC
  Administered 2023-08-02: 20 mg
  Filled 2023-08-01: qty 2

## 2023-08-01 MED ORDER — HYDROMORPHONE HCL 2 MG PO TABS
2.0000 mg | ORAL_TABLET | ORAL | Status: DC
Start: 2023-08-01 — End: 2023-08-01
  Administered 2023-08-01: 2 mg
  Filled 2023-08-01: qty 1

## 2023-08-01 NOTE — TOC Progression Note (Signed)
Transition of Care [facility]) - Inpatient Brief Assessment  Patient Details  Name: Jesus Haynes MRN: [id number] Date of Birth: [birth year]  Transition of Care [facility]) CM/SW Contact:    Ewing Schlein, LCSW Phone Number: 08/01/2023, 10:05 AM  Clinical Narrative: CSW met with patient to discuss PCP needs. Per patient, he already has a PCP but cannot remember her name. Patient is also followed by palliative outpatient. No TOC needs identified at this time.  Transition of Care Asessment: Insurance and Status: Insurance coverage has been reviewed Patient has primary care physician: Yes Home environment has been reviewed: Resides in single family home with daughter Prior level of function:: Independent with ADLs at baseline Prior/Current Home Services: No current home services Social Drivers of Health Review: SDOH reviewed no interventions necessary Readmission risk has been reviewed: Yes Transition of care needs: no transition of care needs at this time  Barriers to Discharge: Continued Medical Work up  Social Determinants of Health (SDOH) Interventions SDOH Screenings   Food Insecurity: No Food Insecurity (07/30/2023)  Housing: High Risk (07/30/2023)  Transportation Needs: No Transportation Needs (07/30/2023)  Utilities: Not At Risk (07/30/2023)  Depression (PHQ2-9): Low Risk  (07/29/2023)  Social Connections: Unknown (07/30/2023)  Tobacco Use: Medium Risk (07/30/2023)   Readmission Risk Interventions    07/31/2023    3:41 PM  Readmission Risk Prevention Plan  Transportation Screening Complete  Medication Review (RN Care Manager) Complete  HRI or Home Care Consult Complete  SW Recovery Care/Counseling Consult Complete  Palliative Care Screening Not Applicable  Skilled Nursing Facility Not Applicable

## 2023-08-01 NOTE — Progress Notes (Signed)
Asked to resuture patient's J-tube as several sutures had pulled through his skin.  The inferior suture remained in place.  2 new 3-0 nylon interrupted sutures were placed through the phalange of the j-tube to resecure this to his skin.  Patient tolerated this well.  Letha Cape 3:03 PM 08/01/2023

## 2023-08-01 NOTE — Consult Note (Signed)
[facility]  [phone]   HEMATOLOGY/ONCOLOGY IN-PATIENT CONSULTATION NOTE   PATIENT NAME: Jesus Haynes   MR#: [id number] DOB: 1976/04/27 CSN#: [id number]   DATE OF SERVICE: 08/01/2023  Requesting Physician: [facility]   Patient Care Team: Ivonne Andrew, NP as PCP - General (Pulmonary Disease) Ripley Fraise Edmund Hilda Care Management Kess Mcilwain, Archie Patten, MD as Consulting Physician (Oncology) Vida Rigger, MD as Consulting Physician (Gastroenterology) Pickenpack-Cousar, Arty Baumgartner, NP as Nurse Practitioner (Hospice and Palliative Medicine)  REASON FOR CONSULTATION:  Management decisions in a patient with GE junction adenocarcinoma, blood loss anemia  ASSESSMENT & PLAN:  Anemia from blood loss:  On arrival, his hemoglobin was 6.8.  Recent baseline has been between 8 and 9.  Patient reported several episodes of melena prior to arrival.  He was transfused 2 units of PRBCs.  Hemoglobin currently stable above 8.  GI has been consulted but no intervention planned, given etiology of known GE junction adenocarcinoma causing blood loss.  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  Started palliative systemic chemoimmunotherapy with FOLFOX plus nivolumab from 07/23/2023.  Patient at this time wants to continue to pursue systemic treatments.  If poor tolerability, he would decide on proceeding with hospice.  We respect his wishes.  He is currently scheduled to see me again in clinic on 08/07/2023 and I will see him as scheduled.  J-tube issues: He has issues with sutures for his J-tube.  General surgery has been consulted for further assistance.  Rest of care as per primary team and other specialties.  Thanks for the opportunity to participate in the care of this patient. Please contact me if there are any questions.   Meryl Crutch, MD Medical Oncology and Hematology 08/01/2023 12:15 PM    HISTORY OF PRESENT ILLNESS:  Jesus Haynes is a 48 y.o.  pleasant gentleman with history of asthma, presented to the ED on 05/27/2023 with complaints of epigastric burning abdominal pain, nausea, retching.  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   He was admitted to the hospital this time on 07/30/2023 after he presented with chest and epigastric pain, episodes of melena.  He was found to have hemoglobin of 6.8.  He was admitted for blood transfusion and further evaluation.  Oncology History  Adenocarcinoma of gastroesophageal junction (HCC)  05/29/2023 Initial Diagnosis   Adenocarcinoma of gastroesophageal junction (HCC)   07/18/2023 Cancer Staging   Staging form: Esophagus - Adenocarcinoma, AJCC 8th Edition - Clinical: Stage IVB (cT4, cN3, cM1, G3) - Signed by Meryl Crutch, MD on 07/18/2023 Histologic grading system: 3 grade system   07/23/2023 -  Chemotherapy   Patient is on Treatment Plan : GASTROESOPHAGEAL FOLFOX + Nivolumab q14d      48 y.o. gentleman with history of asthma, presented to the ED on 05/27/2023 with complaints of epigastric burning abdominal pain, nausea, retching.  He also reported some dark tarry stools for 3 days prior to arrival.  He was previously seen in the ED at [facility] on 05/08/2023 with complaints of chest or right upper quadrant abdominal pain.  Workup was unremarkable at that time and he was sent home with Protonix and Carafate.  Since his symptoms persisted, he presented to the ED again.   In the ED, his hemoglobin was noted to be down to 10, compared to 13  previously.  With concern for upper GI bleed, was admitted for further evaluation and management.   Dr. Ewing Haynes performed upper GI endoscopy on 05/28/2023.  It showed partially obstructing, likely malignant esophageal tumor in the lower third of  the esophagus.  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  Normal duodenum.  Pathology from GE junction mass came back positive for invasive adenocarcinoma, moderately to poorly differentiated.  Immunostains pending.   CT chest abdomen pelvis on 05/28/2023 showed abnormal wall thickening at the GE junction, compatible with patient's known carcinoma.  Prominent lymph nodes in the retrocrural region on the right side, adjacent to the hiatal hernia.  Abnormal low-density soft tissue in the gastrohepatic region, abutting the lesser curvature and proximal stomach, worrisome for metastatic disease.  Periportal, periceliac, gastrosplenic and left retroperitoneal lymphadenopathy, worrisome for metastatic involvement.  Hypodensities in the liver were also noted, worrisome for metastatic disease, largest 1 measuring 13 mm in the inferior right lobe.  Fat stranding surrounding the body and tail of pancreas, suggesting acute pancreatitis.   We were consulted during his hospitalization on 05/29/2023 for additional recommendations given new diagnosis of GE junction adenocarcinoma.   On 06/07/2023, staging PET/CT showed large hypermetabolic mass involving the distal esophagus and proximal stomach consistent with known primary esophageal carcinoma.  Multiple hypermetabolic retroperitoneal, lower mediastinal lymph nodes consistent with locally advanced disease.  Suspected direct tumor extension into the pancreatic body.  Hypermetabolic node or peritoneal implant within the omentum.  No other evidence of metastatic disease.   Plan was for palliative systemic treatments with FOLFOX plus nivolumab.  However patient's performance status continued to decline and he had to be hospitalized again on 06/14/2023.   He finally presented to reestablish care in our clinic on 07/18/2023.  His performance status improved with nutrition via J-tube.    He started systemic treatments with FOLFOX plus nivolumab from  07/23/2023.   MEDICAL HISTORY Past Medical History:  Diagnosis Date   Asthma      SURGICAL HISTORY Past Surgical History:  Procedure Laterality Date   BIOPSY  05/28/2023   Procedure: BIOPSY;  Surgeon: Vida Rigger, MD;  Location: [facility];  Service: Gastroenterology;;   ESOPHAGOGASTRODUODENOSCOPY (EGD) WITH PROPOFOL N/A 05/28/2023   Procedure: ESOPHAGOGASTRODUODENOSCOPY (EGD) WITH PROPOFOL;  Surgeon: Vida Rigger, MD;  Location: [facility];  Service: Gastroenterology;  Laterality: N/A;   IR IMAGING GUIDED PORT INSERTION  06/13/2023   UMBILICAL HERNIA REPAIR N/A 06/18/2023   Procedure: PLACEMENT OF J TUBE;  Surgeon: Fritzi Mandes, MD;  Location: [facility] ORS;  Service: General;  Laterality: N/A;     ALLERGIES  No Known Allergies  FAMILY HISTORY  Family History  Problem Relation Age of Onset   Heart failure Father      SOCIAL HISTORY   Social History   Socioeconomic History   Marital status: Single    Spouse name: Not on file   Number of children: Not on file   Years of education: Not on file   Highest education level: Not on file  Occupational History   Not on file  Tobacco Use   Smoking status: Former    Current packs/day: 0.00    Types: Cigarettes    Quit date: 12/26/2012    Years since quitting: 10.6   Smokeless tobacco: Never  Vaping Use   Vaping status: Former   Quit date: 08/21/2018   Substances: Nicotine  Substance and Sexual Activity   Alcohol use: No   Drug use: No   Sexual activity: Not  Currently  Other Topics Concern   Not on file  Social History Narrative   Not on file   Social Drivers of Health   Financial Resource Strain: Not on file  Food Insecurity: No Food Insecurity (07/30/2023)   Hunger Vital Sign    Worried About Running Out of Food in the Last Year: Never true    Ran Out of Food in the Last Year: Never true  Transportation Needs: No Transportation Needs (07/30/2023)   PRAPARE - Administrator, Civil Service (Medical): No     Lack of Transportation (Non-Medical): No  Physical Activity: Not on file  Stress: Not on file  Social Connections: Unknown (07/30/2023)   Social Connection and Isolation Panel [NHANES]    Frequency of Communication with Friends and Family: More than three times a week    Frequency of Social Gatherings with Friends and Family: More than three times a week    Attends Religious Services: Patient declined    Database administrator or Organizations: No    Attends Banker Meetings: Never    Marital Status: Never married  Intimate Partner Violence: Not At Risk (07/30/2023)   Humiliation, Afraid, Rape, and Kick questionnaire    Fear of Current or Ex-Partner: No    Emotionally Abused: No    Physically Abused: No    Sexually Abused: No    CURRENT MEDICATIONS   Current Outpatient Medications  Medication Instructions   albuterol (PROVENTIL) 2.5 mg, Nebulization, Every 6 hours PRN   albuterol (VENTOLIN HFA) 108 (90 Base) MCG/ACT inhaler 1-2 puffs, Inhalation, Every 4 hours PRN   B-12 1,000 mcg, Oral, Daily   dexamethasone (DECADRON) 8 mg, Oral, Daily, Start the day after chemotherapy for 2 days. Take with food.   folic acid (FOLVITE) 1 mg, Oral, Daily   furosemide (LASIX) 20 mg, Oral, Daily   [START ON 08/05/2023] HYDROmorphone (DILAUDID) 2 mg, Oral, Every 4 hours PRN   lidocaine-prilocaine (EMLA) cream Apply to affected area once   meclizine (ANTIVERT) 25 mg, Oral, 3 times daily PRN   methadone (DOLOPHINE) 5 mg, Oral, Every 8 hours   metoCLOPramide (REGLAN) 10 mg, Oral, 3 times daily before meals & bedtime   Nutritional Supplements (FEEDING SUPPLEMENT, OSMOLITE 1.5 CAL,) LIQD At 60 mL/h over 18 hours on a daily basis.   ondansetron (ZOFRAN) 8 mg, Oral, Every 8 hours PRN, Start on the third day after chemotherapy.   pantoprazole (PROTONIX) 40 mg, Oral, 2 times daily   polyethylene glycol powder (GLYCOLAX/MIRALAX) 17 GM/SCOOP powder Dissolve 17 g in 4 oz of liquid and take by mouth  daily.   potassium chloride (KLOR-CON) 20 MEQ packet 20 mEq, Oral, Daily   prochlorperazine (COMPAZINE) 10 mg, Oral, Every 6 hours PRN   senna-docusate (SENOKOT-S) 8.6-50 MG tablet 2 tablets, Oral, Daily at bedtime   Water For Irrigation, Sterile (FREE WATER) SOLN 100 mLs, Per Tube, 4 times daily     REVIEW OF SYSTEMS   Review of Systems - Oncology  All other pertinent review of systems is negative except as mentioned above in HPI  PHYSICAL EXAMINATION  ECOG PERFORMANCE STATUS: 3 - Symptomatic, >50% confined to bed  Vitals:   08/01/23 0257 08/01/23 1027  BP: 127/81   Pulse: 94   Resp: 20   Temp: 98.6 F (37 C)   SpO2: 96% 96%   Filed Weights   07/30/23 1931  Weight: 176 lb 5.9 oz (80 kg)    Physical Exam Constitutional:  General: He is not in acute distress.    Appearance: Normal appearance.  HENT:     Head: Normocephalic and atraumatic.  Eyes:     General: No scleral icterus.    Conjunctiva/sclera: Conjunctivae normal.  Cardiovascular:     Rate and Rhythm: Normal rate and regular rhythm.     Heart sounds: Normal heart sounds.  Pulmonary:     Effort: Pulmonary effort is normal.     Breath sounds: Normal breath sounds.  Abdominal:     General: There is no distension.     Comments: J-tube in place  Musculoskeletal:     Right lower leg: No edema.     Left lower leg: No edema.  Neurological:     General: No focal deficit present.     Mental Status: He is alert and oriented to person, place, and time.  Psychiatric:        Mood and Affect: Mood normal.        Behavior: Behavior normal.        Thought Content: Thought content normal.     LABORATORY DATA:   I have reviewed the data as listed  Results for orders placed or performed during the hospital encounter of 07/30/23 (from the past 24 hours)  Basic metabolic panel   Collection Time: 08/01/23  4:26 AM  Result Value Ref Range   Sodium 134 (L) 135 - 145 mmol/L   Potassium 3.9 3.5 - 5.1 mmol/L    Chloride 103 98 - 111 mmol/L   CO2 26 22 - 32 mmol/L   Glucose, Bld 141 (H) 70 - 99 mg/dL   BUN 9 6 - 20 mg/dL   Creatinine, Ser 4.09 (L) 0.61 - 1.24 mg/dL   Calcium 7.7 (L) 8.9 - 10.3 mg/dL   GFR, Estimated >81 >19 mL/min   Anion gap 5 5 - 15  CBC with Differential/Platelet   Collection Time: 08/01/23  4:26 AM  Result Value Ref Range   WBC 6.7 4.0 - 10.5 K/uL   RBC 3.63 (L) 4.22 - 5.81 MIL/uL   Hemoglobin 8.9 (L) 13.0 - 17.0 g/dL   HCT 14.7 (L) 82.9 - 56.2 %   MCV 81.8 80.0 - 100.0 fL   MCH 24.5 (L) 26.0 - 34.0 pg   MCHC 30.0 30.0 - 36.0 g/dL   RDW 13.0 (H) 86.5 - 78.4 %   Platelets 261 150 - 400 K/uL   nRBC 0.0 0.0 - 0.2 %   Neutrophils Relative % 68 %   Neutro Abs 4.6 1.7 - 7.7 K/uL   Lymphocytes Relative 17 %   Lymphs Abs 1.2 0.7 - 4.0 K/uL   Monocytes Relative 11 %   Monocytes Absolute 0.7 0.1 - 1.0 K/uL   Eosinophils Relative 3 %   Eosinophils Absolute 0.2 0.0 - 0.5 K/uL   Basophils Relative 0 %   Basophils Absolute 0.0 0.0 - 0.1 K/uL   Immature Granulocytes 1 %   Abs Immature Granulocytes 0.03 0.00 - 0.07 K/uL      RADIOGRAPHIC STUDIES:  I have personally reviewed the radiological images as listed and agree with the findings in the report.  DG Chest 2 View Result Date: 07/30/2023 CLINICAL DATA:  Chest pain.  Lung cancer. EXAM: CHEST - 2 VIEW COMPARISON:  Chest radiograph dated 05/08/2023. FINDINGS: Right-sided Port-A-Cath with tip at the cavoatrial junction. Small bilateral pleural effusions and bibasilar atelectasis. No consolidative changes. No pneumothorax. The cardiac silhouette is within normal limits. No acute osseous pathology. IMPRESSION: Small  bilateral pleural effusions and bibasilar atelectasis. Electronically Signed   By: Elgie Collard M.D.   On: 07/30/2023 10:26   CT ABDOMEN PELVIS W CONTRAST Result Date: 07/03/2023 CLINICAL DATA:  Bowel obstruction, colonic ileus on previous x-ray, history of adenocarcinoma at the gastroesophageal junction EXAM: CT  ABDOMEN AND PELVIS WITH CONTRAST TECHNIQUE: Multidetector CT imaging of the abdomen and pelvis was performed using the standard protocol following bolus administration of intravenous contrast. RADIATION DOSE REDUCTION: This exam was performed according to the departmental dose-optimization program which includes automated exposure control, adjustment of the mA and/or kV according to patient size and/or use of iterative reconstruction technique. CONTRAST:  OMNIPAQUE IOHEXOL 300 MG/ML  SOLN COMPARISON:  06/26/2023, 06/07/2023 FINDINGS: Lower chest: Trace bilateral pleural effusions with dependent lower lobe atelectasis. Infiltrative soft tissue mass involving the distal esophagus and proximal stomach is again noted. Posterior mediastinal adenopathy measuring up to 12 mm consistent with metastatic disease. Hepatobiliary: Stable hepatic cysts. Otherwise unremarkable appearance of the liver and gallbladder. Pancreas: The infiltrative mass at the gastroesophageal junction involves the superior margin of the pancreatic body, similar to prior exam. This area measures approximately 5.2 x 4.2 cm reference image 32/3. No pancreatic duct dilation. Spleen: Normal in size without focal abnormality. Adrenals/Urinary Tract: Adrenal glands are unremarkable. Kidneys are normal, without renal calculi, focal lesion, or hydronephrosis. Bladder is unremarkable. Stomach/Bowel: Infiltrative mass involving the distal esophagus, gastric cardia, and gastric fundus is again identified, consistent with known history of malignancy. There is increased surrounding mesenteric stranding, as well is progressive irregular mural thickening of the gastric fundus and body. There is a percutaneous jejunostomy catheter in stable position. No evidence of small-bowel obstruction. Continued diffuse colonic distension with oral contrast and gas fluid levels throughout the colon, consistent with adynamic colonic ileus. Normal appendix right lower  quadrant. Vascular/Lymphatic: Lymphadenopathy is seen along the stomach, gastric hepatic ligament, and porta hepatis. Index lymph nodes are as follows: Porta hepatis, image 31/3, 18 mm.  Previously 14 mm. Anterior mesenteric, image 29/3, 19 mm.  Previously 17 mm. Retroperitoneal, image 42/3, 15 mm.  Previously 15 mm. 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 Reproductive: Prostate is unremarkable. Other: Trace right upper quadrant ascites. No free intraperitoneal gas. No abdominal wall hernia. Musculoskeletal: No acute or destructive bony abnormalities. Reconstructed images demonstrate no additional findings. IMPRESSION: 1. Large infiltrative mass centered at the gastric cardia, involving the distal esophagus, gastric fundus/body, and superior margin of the pancreas. Overall increase in surrounding mesenteric stranding as well as irregular mural thickening of the stomach consistent with disease progression. 2. Metastatic lymphadenopathy within the retroperitoneum, upper abdomen, and posterior mediastinum, slightly progressive since prior exam. 3. Trace bilateral pleural effusions. 4. Diffuse colonic distension, with oral contrast throughout the colon as well as diffuse gas fluid levels, consistent with adynamic colonic ileus. No evidence of small-bowel obstruction. 5. Stable percutaneous jejunostomy tube. 6. Trace ascites. Electronically Signed   By: Sharlet Salina M.D.   On: 07/03/2023 15:25   CT HEAD WO CONTRAST ( ) Result Date: 07/03/2023 CLINICAL DATA:  Syncope/presyncope, cerebrovascular cause suspected. EXAM: CT HEAD WITHOUT CONTRAST TECHNIQUE: Contiguous axial images were obtained from the base of the skull through the vertex without intravenous contrast. RADIATION DOSE REDUCTION: This exam was performed according to the departmental dose-optimization program which includes automated exposure control, adjustment of the mA and/or kV according to  patient size and/or use of iterative reconstruction technique. COMPARISON:  None Available. FINDINGS: Brain: No  acute intracranial hemorrhage. Gray-white differentiation is preserved. No hydrocephalus or extra-axial collection. No mass effect or midline shift. Vascular: No hyperdense vessel or unexpected calcification. Skull: No calvarial fracture or suspicious bone lesion. Skull base is unremarkable. Sinuses/Orbits: No acute finding. Other: None. IMPRESSION: No acute intracranial abnormality. Electronically Signed   By: Orvan Falconer M.D.   On: 07/03/2023 15:10     This document was completed utilizing speech recognition software. Grammatical errors, random word insertions, pronoun errors, and incomplete sentences are an occasional consequence of this system due to software limitations, ambient noise, and hardware issues. Any formal questions or concerns about the content, text or information contained within the body of this dictation should be directly addressed to the provider for clarification.

## 2023-08-01 NOTE — Progress Notes (Signed)
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 Arlana Pouch 1/28 present [facility] ED central chest pain epigastric in nature SOB-hemoglobin down from 8-6.8 dark tarry stool tolerating some diet--GI consulted-palliative consulted   Plan  GE junction bleeding stage IV adenocarcinoma with anemia of acute blood loss Transfused X1 1/28 secondary to bleeding GI feels scope wouldn't change management- Mainstay of treatment would be FOLFOX and nivolumab-CC  Dr Pasam--he has seen and feels if further bleed or any complications such as perf, would obviate his ability to give chemo--full note to follow Will simplify meds and stop methadone--will give dilaudid 2 q 4  Would stop no-essential meds as best possible Jejunostomy sutures are out, appreicative of general surgery re-affixing the same Malnutrition with feeding jejunostomy Mainly would feed him with jejunostomy feeds which would bypass the GE junction Await reaching goal and then can probably discharge home  DVT prophylaxis: Contraindicated SCD  Status is: Observation The patient remains OBS appropriate and will d/c before 2 midnights.   Discussed with the patient's realtive and HCPOA at bedsdie   Subjective:  Awake coherent in nad No distress no feve rnoc chills No furthe rbleed nor cp vomit  Objective + exam Vitals:   07/31/23 1936 08/01/23 0257 08/01/23 1027 08/01/23 1224  BP: 118/69 127/81  132/72  Pulse: 84 94  93  Resp: 20 20  18   Temp: 98.2 F (36.8 C) 98.6 F (37 C)  98.6 F (37 C)  TempSrc: Oral Oral  Oral  SpO2: 95% 96% 96% 98%   Weight:      Height:       Filed Weights   07/30/23 1931  Weight: 80 kg    Examination:  coherent no distress looks well feels fair no nausea no vomiting S1-S2 no murmur Chest clear Abdomen soft jejunostomy however is not well-approximated to the stomach No lower extremity edema Moving 4 limbs  Data Reviewed: reviewed   CBC    Component Value Date/Time   WBC 6.7 08/01/2023 0426   RBC 3.63 (L) 08/01/2023 0426   HGB 8.9 (L) 08/01/2023 0426   HGB 8.0 (L) 07/25/2023 1120   HGB 10.5 (L) 06/12/2023 1413   HCT 29.7 (L) 08/01/2023 0426   HCT 34.2 (L) 06/12/2023 1413   PLT 261 08/01/2023 0426   PLT 477 (H) 07/25/2023 1120   PLT 543 (H) 06/12/2023 1413   MCV 81.8 08/01/2023 0426   MCV 82 06/12/2023 1413   MCH 24.5 (L) 08/01/2023 0426   MCHC 30.0 08/01/2023 0426   RDW 18.6 (H) 08/01/2023 0426   RDW 13.6 06/12/2023 1413   LYMPHSABS 1.2 08/01/2023 0426   MONOABS 0.7 08/01/2023 0426   EOSABS 0.2 08/01/2023 0426   BASOSABS 0.0 08/01/2023 0426      Latest Ref Rng & Units 08/01/2023    4:26 AM 07/31/2023    4:25 AM 07/30/2023   10:10 AM  CMP  Glucose 70 - 99 mg/dL 811  914  782   BUN 6 - 20 mg/dL 9  8  10    Creatinine 0.61 - 1.24 mg/dL 9.56  2.13  0.86  Sodium 135 - 145 mmol/L 134  135  132   Potassium 3.5 - 5.1 mmol/L 3.9  3.7  3.6   Chloride 98 - 111 mmol/L 103  101  97   CO2 22 - 32 mmol/L 26  26  26    Calcium 8.9 - 10.3 mg/dL 7.7  8.1  8.1   Total Protein 6.5 - 8.1 g/dL  5.3    Total Bilirubin 0.0 - 1.2 mg/dL  0.7    Alkaline Phos 38 - 126 U/L  78    AST 15 - 41 U/L  21    ALT 0 - 44 U/L  24      Scheduled Meds:  feeding supplement (OSMOLITE 1.5 CAL)  1,080 mL Per Tube Q24H   free water  100 mL Per Tube QID   [START ON 08/02/2023] furosemide  20 mg Per Tube Daily   HYDROmorphone HCl  2 mg Per Tube Q4H while awake   mouth rinse  15 mL Mouth Rinse 4 times per day   pantoprazole (PROTONIX) IV  40 mg Intravenous Q12H   polyethylene glycol  17 g Per Tube Daily    potassium chloride  20 mEq Per Tube Daily   sucralfate  1 g Oral TID WC & HS   Continuous Infusions:  Time  25  Rhetta Mura, MD  [facility]

## 2023-08-01 NOTE — Progress Notes (Signed)
Initial Nutrition Assessment  DOCUMENTATION CODES:   Non-severe (moderate) malnutrition in context of chronic illness  INTERVENTION:  - Continue current TF regimen via J-tube: Osmolite 1.5 at 60 ml/h x18 hours a day Prosource TF20 60 ml BID +149mL QID FWF Provides 1780 kcal (~83% of needs), 108 gm protein (100% of needs), 1223 ml free water daily  - MD allowing liquids PO for comfort which will aid in meeting remaining kcals to meet nutrition goals.   - Monitor weight trends.   NUTRITION DIAGNOSIS:   Moderate Malnutrition related to chronic illness, cancer and cancer related treatments as evidenced by mild fat depletion, mild muscle depletion.  GOAL:   Patient will meet greater than or equal to 90% of their needs  MONITOR:   Weight trends, TF tolerance  REASON FOR ASSESSMENT:   New TF    ASSESSMENT:   48 y.o. male with PMH asthma and stage IV adenocarcinoma of GE junction (diagnosed 05/28/2023) who started chemotherapy 1 week ago. Was recently hospitalized 12/13 through 1/9 during which he had a J-tube placed (12/17). Presented with complaints of chest an epigastric pain and admitted for GE junction bleeding with acute on chronic blood loss anemia.  Patient laying in bed at time of visit, receiving meds from RN. Patient known to this RD from recent admission. He reports he is unsure of a recent UBW or if he has had any changes since recent discharge. Per EMR, weight appears stable/without significant changes the past 1.5 months.  He reports being unsure of the formula/rate/hours he was receiving tube feeds at home. Does confirm he was getting tube feeds daily at home.  Also reports eating some PTA. Notes it was mostly liquids but unable to state what liquids he was taking in.   Home regimen recommended on recent discharge was Osmolite 1.5 at 63mL/hr for 18 hours a day + QID FWF. Patient is currently ordered this regimen and TF infusing at goal of 38mL/hr at time of  visit. He Is unsure if he has been at 38mL/hr since admission or just advanced to it recently. Howver, reports tolerating goal rate fairly well. Reports he has some abdominal pain but admits he always has some pain due to cancer.  MD currently allowing patient liquids PO but wants majority of nutrition via J-tube.  Of note, during last admission patient would often request tube feeds to be turned off due to abdominal pain. The highest TF rate he tolerated was 57mL/hr so suspect he will continue to be unable to tolerate anything higher. He did report tolerating cyclic feeds better.  Plan to continue current TF regimen. Will add ProSource TF20 to meet protein needs.    Medications reviewed and include: QID FWF, Miralax, Carafate  Labs reviewed:  Na 134   NUTRITION - FOCUSED PHYSICAL EXAM:  Flowsheet Row Most Recent Value  Orbital Region Mild depletion  Upper Arm Region Mild depletion  Thoracic and Lumbar Region Mild depletion  Buccal Region Mild depletion  Temple Region No depletion  Clavicle Bone Region Mild depletion  Clavicle and Acromion Bone Region Mild depletion  Scapular Bone Region Unable to assess  Dorsal Hand No depletion  Patellar Region No depletion  Anterior Thigh Region No depletion  Posterior Calf Region No depletion  Edema (RD Assessment) None  Hair Reviewed  Eyes Reviewed  Mouth Reviewed  Skin Reviewed  Nails Reviewed       Diet Order:   Diet Order     None  EDUCATION NEEDS:  Education needs have been addressed  Skin:  Skin Assessment: Reviewed RN Assessment  Last BM:  1/28  Height:  Ht Readings from Last 1 Encounters:  07/30/23 5\' 6"  (1.676 m)   Weight:  Wt Readings from Last 1 Encounters:  07/30/23 80 kg    BMI:  Body mass index is 28.47 kg/m.  Estimated Nutritional Needs:  Kcal:  2150-2400 kcals Protein:  105-120 grams Fluid:  >/= 2.2L    Shelle Iron RD, LDN Contact via Secure Chat.

## 2023-08-01 NOTE — Plan of Care (Signed)
Problem: Education: Goal: Knowledge of General Education information will improve Description Including pain rating scale, medication(s)/side effects and non-pharmacologic comfort measures Outcome: Progressing   Problem: Activity: Goal: Risk for activity intolerance will decrease Outcome: Progressing   Problem: Safety: Goal: Ability to remain free from injury will improve Outcome: Progressing

## 2023-08-02 ENCOUNTER — Other Ambulatory Visit (HOSPITAL_COMMUNITY): Payer: Self-pay

## 2023-08-02 ENCOUNTER — Other Ambulatory Visit: Payer: Self-pay | Admitting: Nurse Practitioner

## 2023-08-02 DIAGNOSIS — D62 Acute posthemorrhagic anemia: Secondary | ICD-10-CM | POA: Diagnosis not present

## 2023-08-02 LAB — CBC WITH DIFFERENTIAL/PLATELET
Abs Immature Granulocytes: 0.03 10*3/uL (ref 0.00–0.07)
Basophils Absolute: 0 10*3/uL (ref 0.0–0.1)
Basophils Relative: 0 %
Eosinophils Absolute: 0.2 10*3/uL (ref 0.0–0.5)
Eosinophils Relative: 3 %
HCT: 29.8 % — ABNORMAL LOW (ref 39.0–52.0)
Hemoglobin: 9.4 g/dL — ABNORMAL LOW (ref 13.0–17.0)
Immature Granulocytes: 1 %
Lymphocytes Relative: 25 %
Lymphs Abs: 1.4 10*3/uL (ref 0.7–4.0)
MCH: 24.9 pg — ABNORMAL LOW (ref 26.0–34.0)
MCHC: 31.5 g/dL (ref 30.0–36.0)
MCV: 78.8 fL — ABNORMAL LOW (ref 80.0–100.0)
Monocytes Absolute: 0.8 10*3/uL (ref 0.1–1.0)
Monocytes Relative: 14 %
Neutro Abs: 3.2 10*3/uL (ref 1.7–7.7)
Neutrophils Relative %: 57 %
Platelets: 208 10*3/uL (ref 150–400)
RBC: 3.78 MIL/uL — ABNORMAL LOW (ref 4.22–5.81)
RDW: 18.7 % — ABNORMAL HIGH (ref 11.5–15.5)
WBC: 5.6 10*3/uL (ref 4.0–10.5)
nRBC: 0 % (ref 0.0–0.2)

## 2023-08-02 LAB — GLUCOSE, CAPILLARY
Glucose-Capillary: 100 mg/dL — ABNORMAL HIGH (ref 70–99)
Glucose-Capillary: 120 mg/dL — ABNORMAL HIGH (ref 70–99)
Glucose-Capillary: 158 mg/dL — ABNORMAL HIGH (ref 70–99)

## 2023-08-02 LAB — PHOSPHORUS: Phosphorus: 3.2 mg/dL (ref 2.5–4.6)

## 2023-08-02 LAB — BASIC METABOLIC PANEL
Anion gap: 5 (ref 5–15)
BUN: 10 mg/dL (ref 6–20)
CO2: 24 mmol/L (ref 22–32)
Calcium: 7.6 mg/dL — ABNORMAL LOW (ref 8.9–10.3)
Chloride: 102 mmol/L (ref 98–111)
Creatinine, Ser: 0.47 mg/dL — ABNORMAL LOW (ref 0.61–1.24)
GFR, Estimated: >60 mL/min (ref >60)
Glucose, Bld: 138 mg/dL — ABNORMAL HIGH (ref 70–99)
Potassium: 4 mmol/L (ref 3.5–5.1)
Sodium: 131 mmol/L — ABNORMAL LOW (ref 135–145)

## 2023-08-02 LAB — MAGNESIUM: Magnesium: 2 mg/dL (ref 1.7–2.4)

## 2023-08-02 MED ORDER — PANTOPRAZOLE SODIUM 40 MG PO TBEC
40.0000 mg | DELAYED_RELEASE_TABLET | Freq: Two times a day (BID) | ORAL | 1 refills | Status: DC
  Filled 2023-08-02: qty 60, 30d supply, fill #0
  Filled 2023-08-05 – 2023-09-06 (×5): qty 60, 30d supply, fill #1

## 2023-08-02 MED ORDER — HYDROMORPHONE HCL 2 MG PO TABS
2.0000 mg | ORAL_TABLET | ORAL | 0 refills | Status: DC | PRN
  Filled 2023-08-02: qty 30, 5d supply, fill #0
  Filled ????-??-??: fill #0

## 2023-08-02 MED ORDER — SUCRALFATE 1 GM/10ML PO SUSP
1.0000 g | Freq: Three times a day (TID) | ORAL | 0 refills | Status: DC
  Filled 2023-08-02: qty 420, 11d supply, fill #0

## 2023-08-02 MED ORDER — HYDROMORPHONE HCL 1 MG/ML PO LIQD
2.0000 mg | Freq: Once | ORAL | Status: AC
  Administered 2023-08-02: 2 mg via ORAL
  Filled 2023-08-02: qty 2

## 2023-08-02 NOTE — Progress Notes (Signed)
 Palliative Medicine [facility]  [phone] [phone]   Name: Jesus Haynes Date: 08/02/2023 MRN: [id number]  DOB: 12-15-1975  Patient Care Team: [name], NP as PCP - General (Pulmonary Disease) Carolynn Tillman KATHEE georgann DESIREE Care Management Pasam, Chinita, MD as Consulting Physician (Oncology) Rosalie Kitchens, MD as Consulting Physician (Gastroenterology) Pickenpack-Cousar, Fannie SAILOR, NP as Nurse Practitioner (Hospice and Palliative Medicine)    INTERVAL HISTORY: Jesus Haynes is a 48 y.o. 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 Palliative ask to see for symptom management and goals of care.   SOCIAL HISTORY:     reports that he quit smoking about 10 years ago. His smoking use included cigarettes. He has never used smokeless tobacco. He reports that he does not drink alcohol and does not use drugs.  ADVANCE DIRECTIVES:  Advanced directives on file naming Charleen Range as the primary and Mikhi Athey as the secondary decision makers should Jesus Haynes become unable to make decisions for himself.   CODE STATUS: Full code  PAST MEDICAL HISTORY: Past Medical History:  Diagnosis Date   Asthma     ALLERGIES:  has no known allergies.  MEDICATIONS:  Current Outpatient Medications  Medication Sig Dispense Refill   albuterol  (PROVENTIL ) (5 MG/ML) 0.5% nebulizer solution Take 0.5 mLs (2.5 mg total) by nebulization every 6 (six) hours as needed for wheezing or shortness of breath. 20 mL 3   albuterol  (VENTOLIN  HFA) 108 (90 Base) MCG/ACT inhaler Inhale 1-2 puffs into the lungs every 4 (four) hours as needed for wheezing or shortness of breath. 18 g 3   cyanocobalamin  1000 MCG tablet Take 1 tablet (1,000 mcg total) by mouth daily. 30 tablet 2   folic acid  (FOLVITE ) 1 MG tablet Take 1 tablet (1 mg total) by mouth  daily. 30 tablet 2   furosemide  (LASIX ) 20 MG tablet Take 1 tablet (20 mg total) by mouth daily. 4 tablet 0   HYDROmorphone  (DILAUDID ) 2 MG tablet Take 1 tablet (2 mg total) by mouth every 4 (four) hours as needed for severe pain (pain score 7-10). 30 tablet 0   lidocaine -prilocaine  (EMLA ) cream Apply to affected area once 30 g 3   metoCLOPramide  (REGLAN ) 10 MG tablet Take 1 tablet (10 mg total) by mouth 4 (four) times daily -  before meals and at bedtime. 120 tablet 1   Nutritional Supplements (FEEDING SUPPLEMENT, OSMOLITE 1.5 CAL,) LIQD At 60 mL/h over 18 hours on a daily basis. 30000 mL 0   ondansetron  (ZOFRAN ) 8 MG tablet Take 1 tablet (8 mg total) by mouth every 8 (eight) hours as needed for nausea or vomiting. Start on the third day after chemotherapy. 30 tablet 1   pantoprazole  (PROTONIX ) 40 MG tablet Take 1 tablet (40 mg total) by mouth 2 (two) times daily. 60 tablet 1   polyethylene glycol powder (GLYCOLAX /MIRALAX ) 17 GM/SCOOP powder Dissolve 17 g in 4 oz of liquid and take by mouth daily. 476 g 0   potassium chloride  (KLOR-CON ) 20 MEQ packet Take 20 mEq by mouth daily. 30 packet 0   prochlorperazine  (COMPAZINE ) 10 MG tablet Take 1 tablet (10 mg total) by mouth every 6 (six) hours as needed for nausea or vomiting. 30 tablet 1   senna-docusate (SENOKOT-S) 8.6-50 MG tablet Take 2 tablets by mouth at bedtime. 60 tablet 2   sucralfate  (CARAFATE ) 1 GM/10ML suspension Take 10 mLs (  1 g total) by mouth 4 (four) times daily -  with meals and at bedtime. 420 mL 0   Water  For Irrigation, Sterile (FREE WATER ) SOLN Place 100 mLs into feeding tube 4 (four) times daily. 12000 mL 1   No current facility-administered medications for this visit.   Facility-Administered Medications Ordered in Other Visits  Medication Dose Route Frequency Provider Last Rate Last Admin   acetaminophen  (TYLENOL ) 160 MG/5ML solution 650 mg  650 mg Oral Q6H PRN Samtani, Jai-Gurmukh, MD       Or   acetaminophen  (TYLENOL )  suppository 650 mg  650 mg Rectal Q6H PRN Samtani, Jai-Gurmukh, MD       feeding supplement (OSMOLITE 1.5 CAL) liquid 1,080 mL  1,080 mL Per Tube Q24H Samtani, Jai-Gurmukh, MD   1,080 mL at 08/01/23 1650   free water  100 mL  100 mL Per Tube QID Celinda Alm Lot, MD   100 mL at 08/02/23 0915   furosemide  (LASIX ) 10 MG/ML solution 20 mg  20 mg Per Tube Daily Samtani, Jai-Gurmukh, MD   20 mg at 08/02/23 0900   HYDROmorphone  HCl (DILAUDID ) liquid 2 mg  2 mg Per Tube Q4H while awake Samtani, Jai-Gurmukh, MD   2 mg at 08/02/23 0900   ondansetron  (ZOFRAN ) injection 4 mg  4 mg Intravenous Q6H PRN Celinda Alm Lot, MD       Oral care mouth rinse  15 mL Mouth Rinse 4 times per day Celinda Alm Lot, MD   15 mL at 08/01/23 2136   Oral care mouth rinse  15 mL Mouth Rinse PRN Celinda Alm Lot, MD       pantoprazole  (PROTONIX ) injection 40 mg  40 mg Intravenous Q12H Celinda Alm Lot, MD   40 mg at 08/02/23 0900   polyethylene glycol (MIRALAX  / GLYCOLAX ) packet 17 g  17 g Per Tube Daily Celinda Alm Lot, MD   17 g at 08/02/23 0900   potassium chloride  (KLOR-CON ) packet 20 mEq  20 mEq Per Tube Daily Celinda Alm Lot, MD   20 mEq at 08/02/23 0900   sucralfate  (CARAFATE ) 1 GM/10ML suspension 1 g  1 g Oral TID WC & HS Kriss Stagger H, DO   1 g at 08/02/23 0900    VITAL SIGNS: There were no vitals taken for this visit. There were no vitals filed for this visit.  Estimated body mass index is 27.33 kg/m as calculated from the following:   Height as of 07/30/23: 5' 6 (1.676 m).   Weight as of 08/02/23: 169 lb 5 oz (76.8 kg).  Drugs of Abuse     Component Value Date/Time   LABOPIA POSITIVE (A) 07/23/2023 1036   COCAINSCRNUR NONE DETECTED 07/23/2023 1036   LABBENZ POSITIVE (A) 07/23/2023 1036   AMPHETMU NONE DETECTED 07/23/2023 1036   THCU NONE DETECTED 07/23/2023 1036   LABBARB NONE DETECTED 07/23/2023 1036     PERFORMANCE STATUS (ECOG) : 1 - Symptomatic but completely  ambulatory   Physical Exam General: NAD Cardiovascular: regular rate and rhythm Pulmonary: clear ant fields Abdomen: soft, nontender, + bowel sounds Extremities: no edema, no joint deformities Skin: no rashes Neurological: AAO x3  IMPRESSION:  Jesus Haynes presents to clinic for follow-up. No acute distress. He looks much better physically than previous visits. He has concerns about pain management and medication adjustments. He is accompanied by Hoy, the mother of his children, and Burnard, his mother-in-law via speakerphone. Denies nausea, vomiting, constipation, or diarrhea.   He has ongoing  concerns about pain management following a recent hospital discharge. He is currently taking methadone  twice daily and Dilaudid  every three to four hours for pain control. He is particularly concerned about the potential reduction in Dilaudid , which he takes every three hours around the clock, though attempts have been made to extend it to every four hours. His pain is primarily located in the stomach and abdomen, described as sharp and shooting, with intensity reaching ten out of ten at its worst. After taking pain medication, the pain typically decreases to a five to seven out of ten.  We discussed realistic goals for pain medication including ability to wean down on pain medication use as treatment begins to show some improvement and hopefully decrease levels of pain. Jesus Haynes does acknowledge pain is not as severe as it was a month prior. He understands that we will continue to closely monitor and adjust as appropriately. Education provided on use of as needed medication and when to take. Understands if he can go longer periods without taking medication that would be ideal as medication is not scheduled. He is tolerating current regimen without difficulty. No signs of overt use or drowsiness.   There is a concern regarding Medicaid coverage for methadone , as it currently only covers a seven-day  supply, although he has received a 30-day supply. Notified family we will follow-up to see if medication requires prior authorization for full 30day supply. No adjustments to regimen at this time. He will focus on taking hydromorphone  every 4hours as needed or longer pending levels of pain severity.   He has been using trazodone  for sleep, which was prescribed in November but discontinued in January when he started methadone . He has been taking a 50 mg dose, although it was intended to be 25 mg. He reports difficulty sleeping sometimes due to pain other times insomnia. Also, some concerns with appetite. Marinol  is current only firefighter and unable to find at many local pharmacies. Given insomnia concerns and appetite will consider mirtazapine  at bedtime. Education provided. Patient and family verbalized understanding.   We reviewed all of his medications and administration times at length. His family is heavily involved in maintaining his medications ensuring he is taking as prescribed and on schedule. Education provided on medications and interactions amongst each other. Patient aware not to take Zofran  x3 days post chemo in the setting of Aloxi  during treatment.   All questions answered and support provided.   The patient's medication management and refill process will be discussed further with his daughter. All questions answered and support provided.  I discussed the importance of continued conversation with family and their medical providers regarding overall plan of care and treatment options, ensuring decisions are within the context of the patients values and GOCs.  Assessment and Plan  Cancer-related Pain   Patient reports sharp, shooting pain in the abdomen, rating it as high as 10/10 at its worst. Pain is managed with Methadone  and Dilaudid , but he expresses concern about potential reduction in pain medication.   -Continue Methadone  5mg  every 12 hours  -Dilaudid  2mg  every 4  hours as needed for severe pain.    -Monitor pain levels closely and adjust medication as necessary, with the goal of reducing pain medication as cancer treatment progresses and pain decreases.    Sleep Disturbance   Patient reports difficulty sleeping due to pain. Trazodone  was recently introduced but may be interfering with the effectiveness of Methadone .   -Review the use of Trazodone  and consider alternatives that  are compatible with Methadone .   -Mirtazapine  7.5mg  at bedtime for insomnia and appetite   Nausea Post-Chemotherapy   Patient has been prescribed Ondansetron  for nausea following chemotherapy.   -Continue Ondansetron  as needed post-chemotherapy alternating with Compazine . -Education provided not to take Zofran  for 3 days post treatment in the setting of Aloxi . -Reglan  as prescribed.   Medication Supply and Insurance Coverage   Issues raised regarding the supply of Methadone  and Dilaudid , and insurance coverage for Methadone .   -Investigate insurance coverage for Methadone  and work on getting authorization for more than a 7-day supply.   -Check with pharmacy regarding the supply of Dilaudid  and address any shortages.    Feeding Tube Maintenance   Discussion about the need for additional feeding tube parts for cleaning and replacement.   -Advise patient to contact [employer] for feeding tube parts as he has an open order for supplies.  I will plan to see patient back in 3-4 weeks. Sooner if needed.  Patient expressed understanding and was in agreement with this plan. He also understands that He can call the clinic at any time with any questions, concerns, or complaints.   Any controlled substances utilized were prescribed in the context of palliative care. PDMP has been reviewed.   Visit consisted of counseling and education dealing with the complex and emotionally intense issues of symptom management and palliative care in the setting of serious and potentially life-threatening  illness.  Levon Borer, AGPCNP-BC  Palliative Medicine Team/[facility]

## 2023-08-02 NOTE — Discharge Summary (Signed)
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 Arlana Pouch 1/28 present [facility] ED central chest pain epigastric in nature SOB-hemoglobin down from 8-6.8 dark tarry stool tolerating some diet--GI consulted-palliative consulted     Plan   GE junction bleeding stage IV adenocarcinoma with anemia of acute blood loss Transfused X1 1/28 secondary to bleeding GI feels scope wouldn't change management--they saw the patient and are available for further care but have signed off at this time Mainstay of treatment would be FOLFOX and nivolumab-CC  Dr Pasam--he has seen and feels if further bleed or any complications such as perf, would obviate his ability to give chemo--full note to follow I will CC him at discharge to ensure that follow-up can be obtained Will simplify meds and stop methadone--will give dilaudid 2 q 4-prescriptions have been called in for Protonix twice daily as well as Carafate at time of  discharge and warning signs were emphasized to the patient Jejunostomy sutures are out, appreicative of general surgery who came by and replace the sutures at this hospital stay Malnutrition with feeding jejunostomy Mainly would feed him with jejunostomy feeds which would bypass the GE junction Was able to tolerate feeds hemoglobin came up he has severe malnutrition because of inability to take p.o. Prognosis remains somewhat guarded     Discharge Exam: Vitals:   08/01/23 1947 08/02/23 0417  BP: 118/86 108/63  Pulse: 94 91  Resp: 18 17  Temp: 99.1 F (37.3 C) 98.6 F (37 C)  SpO2: 96% 93%    Subj on day of d/c   Awake coherent no distress some tinged stool but nowhere near as dark as prior No chest pain fever Pain is moderate  General Exam on discharge  EOMI NCAT seems to be in some mild discomfort ROM intact Power 5/5 Jejunostomy tube flange in place and sutured in to stomach No lower extremity edema Power 5/5  Discharge Instructions   Discharge Instructions     Diet - low sodium heart healthy   Complete by: As directed    Discharge instructions   Complete by: As directed    Please use the Dilaudid alone for pain I will call in some more of this to the pharmacy so that you do not run out please contact your oncologist for refills He should take Protonix tablets twice a day to help with risk of bleeding as this will help protect your stomach We have also prescribed a medication called Carafate that can be put down the tube that will coat the area around stomach and also help prevent bleeding  It would be best  that you just take the supplements over 18 hours a day and not take food by mouth as this can cause you to bleed from the ulcer and area in the upper abdomen  Should you experience large amounts of bleeding severe abdominal pain uncontrolled by meds or other issues present back to the emergency room  Your oncologist will be made aware of your admission    Increase activity slowly   Complete by: As directed       Allergies as of 08/02/2023   No Known Allergies      Medication List     STOP taking these medications    dexamethasone 4 MG tablet Commonly known as: DECADRON   meclizine 25 MG tablet Commonly known as: ANTIVERT   methadone 5 MG tablet Commonly known as: DOLOPHINE       TAKE these medications    albuterol 108 (90 Base) MCG/ACT inhaler Commonly known as: VENTOLIN HFA Inhale 1-2 puffs into the lungs every 4 (four) hours as needed for wheezing or shortness of breath.   albuterol (5 MG/ML) 0.5% nebulizer solution Commonly known as: PROVENTIL Take 0.5 mLs (2.5 mg total) by nebulization every 6 (six) hours as needed for wheezing or shortness of breath.   B-12 1000 MCG Tabs Take 1 tablet (1,000 mcg total) by mouth daily.   feeding supplement (OSMOLITE 1.5 CAL) Liqd At 60 mL/h over 18 hours on a daily basis.   folic acid 1 MG tablet Commonly known as: FOLVITE Take 1 tablet (1 mg total) by mouth daily.   free water Soln Place 100 mLs into feeding tube 4 (four) times daily.   furosemide 20 MG tablet Commonly known as: LASIX Take 1 tablet (20 mg total) by mouth daily.   HYDROmorphone 2 MG tablet Commonly known as: DILAUDID Take 1 tablet (2 mg total) by mouth every 4 (four) hours as needed for severe pain (pain score 7-10). Start taking on: August 05, 2023 What changed: These instructions start on August 05, 2023. If you are unsure what to do until then, ask your doctor or other care provider.   lidocaine-prilocaine cream Commonly known as: EMLA Apply to affected area once   metoCLOPramide 10 MG tablet Commonly known as: REGLAN Take 1 tablet (10 mg total) by mouth 4 (four) times daily -  before meals and at bedtime.   ondansetron 8 MG tablet Commonly known as: ZOFRAN Take 1 tablet (8 mg total) by mouth every 8 (eight) hours as needed for nausea or vomiting. Start on the third day after  chemotherapy.   pantoprazole 40 MG tablet Commonly known as: Protonix Take 1 tablet (40 mg total) by mouth 2 (two) times daily.   polyethylene glycol powder 17 GM/SCOOP powder Commonly known as: GLYCOLAX/MIRALAX Dissolve 17 g in 4 oz of liquid and take by mouth daily.   potassium chloride 20 MEQ packet Commonly known as: KLOR-CON Take 20 mEq by mouth daily.   prochlorperazine 10 MG tablet Commonly known as: COMPAZINE Take 1 tablet (10 mg total) by mouth every 6 (six) hours as needed for nausea or vomiting.   Stool Softener/Laxative 50-8.6 MG tablet Generic drug: senna-docusate Take 2 tablets by mouth at bedtime.   sucralfate 1 GM/10ML suspension Commonly known as: CARAFATE Take 10 mLs (1 g total) by mouth 4 (four) times daily -  with meals and at bedtime.       No Known Allergies    The results of significant diagnostics from this hospitalization (including imaging, microbiology, ancillary  and laboratory) are listed below for reference.    Significant Diagnostic Studies: DG Chest 2 View Result Date: 07/30/2023 CLINICAL DATA:  Chest pain.  Lung cancer. EXAM: CHEST - 2 VIEW COMPARISON:  Chest radiograph dated 05/08/2023. FINDINGS: Right-sided Port-A-Cath with tip at the cavoatrial junction. Small bilateral pleural effusions and bibasilar atelectasis. No consolidative changes. No pneumothorax. The cardiac silhouette is within normal limits. No acute osseous pathology. IMPRESSION: Small bilateral pleural effusions and bibasilar atelectasis. Electronically Signed   By: Elgie Collard M.D.   On: 07/30/2023 10:26   CT ABDOMEN PELVIS W CONTRAST Result Date: 07/03/2023 CLINICAL DATA:  Bowel obstruction, colonic ileus on previous x-ray, history of adenocarcinoma at the gastroesophageal junction EXAM: CT ABDOMEN AND PELVIS WITH CONTRAST TECHNIQUE: Multidetector CT imaging of the abdomen and pelvis was performed using the standard protocol following bolus administration of intravenous  contrast. RADIATION DOSE REDUCTION: This exam was performed according to the departmental dose-optimization program which includes automated exposure control, adjustment of the mA and/or kV according to patient size and/or use of iterative reconstruction technique. CONTRAST:  OMNIPAQUE IOHEXOL 300 MG/ML  SOLN COMPARISON:  06/26/2023, 06/07/2023 FINDINGS: Lower chest: Trace bilateral pleural effusions with dependent lower lobe atelectasis. Infiltrative soft tissue mass involving the distal esophagus and proximal stomach is again noted. Posterior mediastinal adenopathy measuring up to 12 mm consistent with metastatic disease. Hepatobiliary: Stable hepatic cysts. Otherwise unremarkable appearance of the liver and gallbladder. Pancreas: The infiltrative mass at the gastroesophageal junction involves the superior margin of the pancreatic body, similar to prior exam. This area measures approximately 5.2 x 4.2 cm reference image 32/3. No pancreatic duct dilation. Spleen: Normal in size without focal abnormality. Adrenals/Urinary Tract: Adrenal glands are unremarkable. Kidneys are normal, without renal calculi, focal lesion, or hydronephrosis. Bladder is unremarkable. Stomach/Bowel: Infiltrative mass involving the distal esophagus, gastric cardia, and gastric fundus is again identified, consistent with known history of malignancy. There is increased surrounding mesenteric stranding, as well is progressive irregular mural thickening of the gastric fundus and body. There is a percutaneous jejunostomy catheter in stable position. No evidence of small-bowel obstruction. Continued diffuse colonic distension with oral contrast and gas fluid levels throughout the colon, consistent with adynamic colonic ileus. Normal appendix right lower quadrant. Vascular/Lymphatic: Lymphadenopathy is seen along the stomach, gastric hepatic ligament, and porta hepatis. Index lymph nodes are as follows: Porta hepatis, image 31/3, 18 mm.   Previously 14 mm. Anterior mesenteric, image 29/3, 19 mm.  Previously 17 mm. Retroperitoneal, image 42/3, 15 mm.  Previously 15 mm. 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 Reproductive: Prostate is unremarkable. Other: Trace right upper quadrant ascites. No free intraperitoneal gas. No abdominal wall hernia. Musculoskeletal: No acute or destructive bony abnormalities. Reconstructed images demonstrate no additional findings. IMPRESSION: 1. Large infiltrative mass centered at the gastric cardia, involving the distal esophagus, gastric fundus/body, and superior margin of the pancreas. Overall increase in surrounding mesenteric stranding as well as irregular mural thickening of the stomach consistent with disease progression. 2. Metastatic lymphadenopathy within the retroperitoneum, upper abdomen, and posterior mediastinum, slightly progressive since prior exam. 3. Trace bilateral pleural effusions. 4. Diffuse colonic distension, with oral contrast throughout the colon as well as diffuse gas fluid levels, consistent with adynamic colonic ileus. No evidence of small-bowel obstruction. 5. Stable percutaneous jejunostomy tube. 6. Trace ascites. Electronically Signed   By: Sharlet Salina M.D.   On: 07/03/2023 15:25   CT HEAD WO CONTRAST ( ) Result Date:  07/03/2023 CLINICAL DATA:  Syncope/presyncope, cerebrovascular cause suspected. EXAM: CT HEAD WITHOUT CONTRAST TECHNIQUE: Contiguous axial images were obtained from the base of the skull through the vertex without intravenous contrast. RADIATION DOSE REDUCTION: This exam was performed according to the departmental dose-optimization program which includes automated exposure control, adjustment of the mA and/or kV according to patient size and/or use of iterative reconstruction technique. COMPARISON:  None Available. FINDINGS: Brain: No acute intracranial hemorrhage. Gray-white differentiation is preserved. No  hydrocephalus or extra-axial collection. No mass effect or midline shift. Vascular: No hyperdense vessel or unexpected calcification. Skull: No calvarial fracture or suspicious bone lesion. Skull base is unremarkable. Sinuses/Orbits: No acute finding. Other: None. IMPRESSION: No acute intracranial abnormality. Electronically Signed   By: Orvan Falconer M.D.   On: 07/03/2023 15:10    Microbiology: No results found for this or any previous visit (from the past 240 hours).   Labs: Basic Metabolic Panel: Recent Labs  Lab 07/30/23 1010 07/31/23 0425 08/01/23 0426 08/01/23 1628 08/02/23 0403  NA 132* 135 134*  --  131*  K 3.6 3.7 3.9  --  4.0  CL 97* 101 103  --  102  CO2 26 26 26   --  24  GLUCOSE 107* 167* 141*  --  138*  BUN 10 8 9   --  10  CREATININE 0.59* 0.73 0.59*  --  0.47*  CALCIUM 8.1* 8.1* 7.7*  --  7.6*  MG  --   --   --  2.1 2.0  PHOS  --   --   --  2.5 3.2   Liver Function Tests: Recent Labs  Lab 07/31/23 0425  AST 21  ALT 24  ALKPHOS 78  BILITOT 0.7  PROT 5.3*  ALBUMIN 2.4*   No results for input(s): "LIPASE", "AMYLASE" in the last 168 hours. No results for input(s): "AMMONIA" in the last 168 hours. CBC: Recent Labs  Lab 07/30/23 1010 07/31/23 0425 08/01/23 0426 08/02/23 0403  WBC 6.5 9.0 6.7 5.6  NEUTROABS  --   --  4.6 3.2  HGB 6.8* 9.1* 8.9* 9.4*  HCT 22.6* 29.3* 29.7* 29.8*  MCV 80.7 80.1 81.8 78.8*  PLT 290 273 261 208   Cardiac Enzymes: No results for input(s): "CKTOTAL", "CKMB", "CKMBINDEX", "TROPONINI" in the last 168 hours. BNP: BNP (last 3 results) No results for input(s): "BNP" in the last 8760 hours.  ProBNP (last 3 results) No results for input(s): "PROBNP" in the last 8760 hours.  CBG: Recent Labs  Lab 08/01/23 1623 08/01/23 1947 08/02/23 0031 08/02/23 0415 08/02/23 0730  GLUCAP 120* 120* 120* 158* 100*    Signed:  Rhetta Mura MD   [facility] 08/02/2023, 9:32 AM

## 2023-08-04 ENCOUNTER — Other Ambulatory Visit: Payer: Self-pay | Admitting: Nurse Practitioner

## 2023-08-04 ENCOUNTER — Encounter: Payer: Self-pay | Admitting: Oncology

## 2023-08-04 DIAGNOSIS — C160 Malignant neoplasm of cardia: Secondary | ICD-10-CM

## 2023-08-04 DIAGNOSIS — G893 Neoplasm related pain (acute) (chronic): Secondary | ICD-10-CM

## 2023-08-04 DIAGNOSIS — Z515 Encounter for palliative care: Secondary | ICD-10-CM

## 2023-08-04 MED ORDER — METHADONE HCL 5 MG PO TABS
5.0000 mg | ORAL_TABLET | Freq: Two times a day (BID) | ORAL | 0 refills | Status: DC
  Filled 2023-08-04 – 2023-08-05 (×3): qty 60, 30d supply, fill #0

## 2023-08-04 MED ORDER — HYDROMORPHONE HCL 2 MG PO TABS: 2.0000 mg | ORAL_TABLET | ORAL | 0 refills | Status: DC | PRN

## 2023-08-05 ENCOUNTER — Other Ambulatory Visit: Payer: Self-pay

## 2023-08-05 ENCOUNTER — Other Ambulatory Visit: Payer: Self-pay | Admitting: Nurse Practitioner

## 2023-08-05 ENCOUNTER — Encounter: Payer: Self-pay | Admitting: Oncology

## 2023-08-05 ENCOUNTER — Other Ambulatory Visit (HOSPITAL_COMMUNITY): Payer: Self-pay

## 2023-08-05 DIAGNOSIS — R600 Localized edema: Secondary | ICD-10-CM

## 2023-08-05 MED ORDER — FUROSEMIDE 20 MG PO TABS
20.0000 mg | ORAL_TABLET | Freq: Every day | ORAL | 0 refills | Status: DC
  Filled 2023-08-05 (×2): qty 4, 4d supply, fill #0

## 2023-08-05 NOTE — Telephone Encounter (Signed)
 Please advise [facility]

## 2023-08-07 ENCOUNTER — Encounter: Payer: Self-pay | Admitting: Oncology

## 2023-08-07 ENCOUNTER — Inpatient Hospital Stay (HOSPITAL_BASED_OUTPATIENT_CLINIC_OR_DEPARTMENT_OTHER): Payer: Medicaid Other | Admitting: Nurse Practitioner

## 2023-08-07 ENCOUNTER — Inpatient Hospital Stay: Payer: Medicaid Other | Admitting: Dietician

## 2023-08-07 ENCOUNTER — Encounter: Payer: Self-pay | Admitting: Nurse Practitioner

## 2023-08-07 ENCOUNTER — Inpatient Hospital Stay: Payer: Medicaid Other | Attending: Internal Medicine

## 2023-08-07 ENCOUNTER — Other Ambulatory Visit (HOSPITAL_COMMUNITY): Payer: Self-pay

## 2023-08-07 ENCOUNTER — Inpatient Hospital Stay (HOSPITAL_BASED_OUTPATIENT_CLINIC_OR_DEPARTMENT_OTHER): Payer: Medicaid Other | Admitting: Oncology

## 2023-08-07 ENCOUNTER — Inpatient Hospital Stay: Payer: Medicaid Other

## 2023-08-07 VITALS — BP 127/79 | HR 80 | Temp 98.1°F | Resp 16 | Wt 168.5 lb

## 2023-08-07 DIAGNOSIS — C160 Malignant neoplasm of cardia: Secondary | ICD-10-CM

## 2023-08-07 DIAGNOSIS — D500 Iron deficiency anemia secondary to blood loss (chronic): Secondary | ICD-10-CM | POA: Diagnosis not present

## 2023-08-07 DIAGNOSIS — Z515 Encounter for palliative care: Secondary | ICD-10-CM | POA: Diagnosis not present

## 2023-08-07 DIAGNOSIS — G893 Neoplasm related pain (acute) (chronic): Secondary | ICD-10-CM

## 2023-08-07 DIAGNOSIS — Z95828 Presence of other vascular implants and grafts: Secondary | ICD-10-CM

## 2023-08-07 DIAGNOSIS — R600 Localized edema: Secondary | ICD-10-CM | POA: Diagnosis not present

## 2023-08-07 DIAGNOSIS — F419 Anxiety disorder, unspecified: Secondary | ICD-10-CM

## 2023-08-07 DIAGNOSIS — Z5111 Encounter for antineoplastic chemotherapy: Secondary | ICD-10-CM | POA: Diagnosis present

## 2023-08-07 DIAGNOSIS — G4700 Insomnia, unspecified: Secondary | ICD-10-CM | POA: Diagnosis not present

## 2023-08-07 DIAGNOSIS — J4520 Mild intermittent asthma, uncomplicated: Secondary | ICD-10-CM

## 2023-08-07 DIAGNOSIS — Z79899 Other long term (current) drug therapy: Secondary | ICD-10-CM | POA: Diagnosis not present

## 2023-08-07 LAB — CMP (CANCER CENTER ONLY)
ALT: 13 U/L (ref 0–44)
AST: 18 U/L (ref 15–41)
Albumin: 3.2 g/dL — ABNORMAL LOW (ref 3.5–5.0)
Alkaline Phosphatase: 97 U/L (ref 38–126)
Anion gap: 4 — ABNORMAL LOW (ref 5–15)
BUN: 8 mg/dL (ref 6–20)
CO2: 29 mmol/L (ref 22–32)
Calcium: 8.8 mg/dL — ABNORMAL LOW (ref 8.9–10.3)
Chloride: 103 mmol/L (ref 98–111)
Creatinine: 0.7 mg/dL (ref 0.61–1.24)
GFR, Estimated: >60 mL/min (ref >60)
Glucose, Bld: 99 mg/dL (ref 70–99)
Potassium: 4.1 mmol/L (ref 3.5–5.1)
Sodium: 136 mmol/L (ref 135–145)
Total Bilirubin: 0.4 mg/dL (ref 0.0–1.2)
Total Protein: 6.4 g/dL — ABNORMAL LOW (ref 6.5–8.1)

## 2023-08-07 LAB — CBC WITH DIFFERENTIAL (CANCER CENTER ONLY)
Abs Immature Granulocytes: 0 10*3/uL (ref 0.00–0.07)
Basophils Absolute: 0 10*3/uL (ref 0.0–0.1)
Basophils Relative: 1 %
Eosinophils Absolute: 0.2 10*3/uL (ref 0.0–0.5)
Eosinophils Relative: 7 %
HCT: 30.9 % — ABNORMAL LOW (ref 39.0–52.0)
Hemoglobin: 9.4 g/dL — ABNORMAL LOW (ref 13.0–17.0)
Immature Granulocytes: 0 %
Lymphocytes Relative: 37 %
Lymphs Abs: 1.2 10*3/uL (ref 0.7–4.0)
MCH: 24 pg — ABNORMAL LOW (ref 26.0–34.0)
MCHC: 30.4 g/dL (ref 30.0–36.0)
MCV: 79 fL — ABNORMAL LOW (ref 80.0–100.0)
Monocytes Absolute: 0.8 10*3/uL (ref 0.1–1.0)
Monocytes Relative: 23 %
Neutro Abs: 1.1 10*3/uL — ABNORMAL LOW (ref 1.7–7.7)
Neutrophils Relative %: 32 %
Platelet Count: 251 10*3/uL (ref 150–400)
RBC: 3.91 MIL/uL — ABNORMAL LOW (ref 4.22–5.81)
RDW: 18.1 % — ABNORMAL HIGH (ref 11.5–15.5)
WBC Count: 3.3 10*3/uL — ABNORMAL LOW (ref 4.0–10.5)
nRBC: 0 % (ref 0.0–0.2)

## 2023-08-07 MED ORDER — SODIUM CHLORIDE 0.9% FLUSH
10.0000 mL | Freq: Once | INTRAVENOUS | Status: AC
  Administered 2023-08-07: 10 mL

## 2023-08-07 MED ORDER — ALBUTEROL SULFATE (5 MG/ML) 0.5% IN NEBU
2.5000 mg | INHALATION_SOLUTION | Freq: Four times a day (QID) | RESPIRATORY_TRACT | 3 refills | Status: DC | PRN
  Filled 2023-08-07: qty 20, 10d supply, fill #0

## 2023-08-07 MED ORDER — HEPARIN SOD (PORK) LOCK FLUSH 100 UNIT/ML IV SOLN: 500.0000 [IU] | Freq: Once | INTRAVENOUS | Status: DC | PRN

## 2023-08-07 MED ORDER — FUROSEMIDE 20 MG PO TABS
20.0000 mg | ORAL_TABLET | Freq: Every day | ORAL | 3 refills | Status: DC | PRN
  Filled 2023-08-07 – 2023-08-10 (×2): qty 30, 30d supply, fill #0
  Filled 2023-08-21 – 2023-09-01 (×2): qty 30, 30d supply, fill #1
  Filled 2023-09-13 – 2023-10-02 (×2): qty 30, 30d supply, fill #2

## 2023-08-07 MED ORDER — ALBUTEROL SULFATE (2.5 MG/3ML) 0.083% IN NEBU
2.5000 mg | INHALATION_SOLUTION | Freq: Four times a day (QID) | RESPIRATORY_TRACT | 12 refills | Status: AC | PRN
  Filled 2023-08-07: qty 75, 7d supply, fill #0
  Filled 2023-08-10 – 2023-09-04 (×5): qty 75, 7d supply, fill #1
  Filled 2023-10-30: qty 75, 7d supply, fill #2
  Filled 2024-02-03: qty 75, 7d supply, fill #3

## 2023-08-07 MED ORDER — DEXTROSE 5 % IV SOLN: INTRAVENOUS | Status: DC

## 2023-08-07 MED ORDER — SUCRALFATE 1 GM/10ML PO SUSP
1.0000 g | Freq: Three times a day (TID) | ORAL | 2 refills | Status: DC
  Filled 2023-08-07 – 2023-08-10 (×2): qty 420, 11d supply, fill #0
  Filled 2023-08-10: qty 473, 12d supply, fill #0
  Filled 2023-08-21 (×3): qty 420, 11d supply, fill #1

## 2023-08-07 MED ORDER — SODIUM CHLORIDE 0.9 % IV SOLN
240.0000 mg | Freq: Once | INTRAVENOUS | Status: AC
  Administered 2023-08-07: 240 mg via INTRAVENOUS
  Filled 2023-08-07: qty 24

## 2023-08-07 MED ORDER — PALONOSETRON HCL INJECTION 0.25 MG/5ML
0.2500 mg | Freq: Once | INTRAVENOUS | Status: AC
  Administered 2023-08-07: 0.25 mg via INTRAVENOUS
  Filled 2023-08-07: qty 5

## 2023-08-07 MED ORDER — HYDROMORPHONE HCL 2 MG PO TABS
2.0000 mg | ORAL_TABLET | ORAL | 0 refills | Status: DC | PRN
  Filled 2023-08-10: qty 90, 15d supply, fill #0

## 2023-08-07 MED ORDER — SODIUM CHLORIDE 0.9% FLUSH: 10.0000 mL | INTRAVENOUS | Status: DC | PRN

## 2023-08-07 MED ORDER — DEXAMETHASONE SODIUM PHOSPHATE 10 MG/ML IJ SOLN
10.0000 mg | Freq: Once | INTRAMUSCULAR | Status: AC
  Administered 2023-08-07: 10 mg via INTRAVENOUS
  Filled 2023-08-07: qty 1

## 2023-08-07 MED ORDER — SODIUM CHLORIDE 0.9 % IV SOLN
2400.0000 mg/m2 | INTRAVENOUS | Status: DC
  Administered 2023-08-07: 5000 mg via INTRAVENOUS
  Filled 2023-08-07: qty 100

## 2023-08-07 MED ORDER — MIRTAZAPINE 7.5 MG PO TABS
7.5000 mg | ORAL_TABLET | Freq: Every day | ORAL | 1 refills | Status: DC
  Filled 2023-08-07: qty 30, 30d supply, fill #0

## 2023-08-07 MED ORDER — ALBUTEROL SULFATE HFA 108 (90 BASE) MCG/ACT IN AERS
1.0000 | INHALATION_SPRAY | RESPIRATORY_TRACT | 3 refills | Status: DC | PRN
  Filled 2023-08-07: qty 18, 30d supply, fill #0
  Filled 2023-08-21 – 2023-09-04 (×6): qty 18, 30d supply, fill #1
  Filled 2023-09-12 – 2023-10-02 (×2): qty 18, 30d supply, fill #2
  Filled 2023-12-11 – 2024-01-02 (×2): qty 18, 30d supply, fill #3
  Filled ????-??-??: fill #1

## 2023-08-07 MED ORDER — LEUCOVORIN CALCIUM INJECTION 350 MG
400.0000 mg/m2 | Freq: Once | INTRAVENOUS | Status: AC
  Administered 2023-08-07: 768 mg via INTRAVENOUS
  Filled 2023-08-07: qty 25

## 2023-08-07 MED ORDER — OXALIPLATIN CHEMO INJECTION 100 MG/20ML
68.0000 mg/m2 | Freq: Once | INTRAVENOUS | Status: AC
  Administered 2023-08-07: 130 mg via INTRAVENOUS
  Filled 2023-08-07: qty 20

## 2023-08-07 MED ORDER — FUROSEMIDE 20 MG PO TABS
20.0000 mg | ORAL_TABLET | Freq: Every day | ORAL | 0 refills | Status: DC
  Filled 2023-08-07: qty 4, 4d supply, fill #0

## 2023-08-07 NOTE — Progress Notes (Signed)
 [facility]  ONCOLOGY CLINIC PROGRESS NOTE   Patient Care Team: [name], NP as PCP - General (Pulmonary Disease) Clemons, Tillman KATHEE georgann DESIREE Care Management Alonza Knisley, Chinita, MD as Consulting Physician (Oncology) Rosalie Kitchens, MD as Consulting Physician (Gastroenterology) Pickenpack-Cousar, Fannie SAILOR, NP as Nurse Practitioner [facility])  PATIENT NAME: Jesus Haynes   MR#: [id number] DOB: 15-Dec-1975  Date of visit: 08/07/2023   ASSESSMENT & PLAN:   Jesus Haynes is a 48 y.o. pleasant gentleman with history of asthma, presented to the ED on 05/27/2023 with complaints of epigastric burning abdominal pain, nausea, retching.  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  Adenocarcinoma of gastroesophageal junction (HCC) -Please review HPI/oncology history for additional details and timeline of events.  -Reviewed staging PET/CT findings with the patient previously.  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  Discussed treatment options. All treatment options are palliative in nature and not curative intent. Plan made to proceed with palliative systemic treatment using FOLFOX plus nivolumab .   -He started palliative systemic treatments with FOLFOX plus nivolumab  from 07/23/2023.  He felt that he tolerated cycle 1 fairly well.  He was admitted to the hospital on 07/30/2023 after he presented with chest and epigastric pain, episodes of melena. He was found to have hemoglobin of 6.8. He was admitted for blood transfusion and further evaluation.  GI deferred additional workup/EGD since the cause of blood loss seemed obvious from malignancy.  -He was discharged home on 08/02/2023.  His performance status is slowly  improving.  He is able to eat better.  Not reliant on tube feeds as much now.  He is walking and taking stairs.  -His hemoglobin is stable at 9.4 today.  However white count is decreased at 3300.  ANC is 1100.  We can proceed with cycle 2 of FOLFOX plus nivolumab  today but we will skip 5-FU bolus today.  He will continue to receive oxaliplatin  at 68 mg/m, 20% dose reduction.  Depending on blood counts, we will adjust dosage and frequency if needed.  For now I will plan to see him back in 2 weeks for labs, follow-up and continuation of chemotherapy.  He was advised to call us  in the interim if any questions or concerns or concerning symptoms.  Cancer associated pain - Continue aggressive pain management.  He is currently on Dilaudid  and methadone .  We greatly appreciate palliative care team's assistance in his management.  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    I reviewed lab results and outside records for this visit and discussed relevant results with the patient. Diagnosis, plan of care and treatment options were also discussed in detail with the patient. Opportunity provided to ask questions and answers provided to his apparent satisfaction. Provided instructions to call our clinic with any problems, questions or concerns prior to return visit. I recommended to continue follow-up with PCP and sub-specialists. He verbalized understanding and agreed with the plan.   NCCN guidelines have been consulted in the planning of this patient's care.  I spent a total of 40 minutes during this encounter with the patient including review of chart and various tests results, discussions about plan of care and coordination  of care plan.   Chinita Patten, MD  08/07/2023 9:58 AM  [facility] - A DEPT OF 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  INTERVAL HISTORY:    Discussed the use of AI scribe software for clinical note transcription with the patient, who gave verbal consent to proceed.   Jesus Haynes is here today for repeat clinical assessment.   He reports that his pain is currently under control with methadone  and Dilaudid . He expresses concern about upcoming changes to his medication regimen. He has been managing his tube feeds at home, spending variable amounts of time on the couch or bed depending on his feeding schedule. He has been increasingly active, even removing himself from tube feeding two days ago and walking around a store. He has been using stairs daily.  The patient's primary concern is swelling in his ankles and legs. He has been prescribed Lasix  by his primary care doctor. He also reports that his albuterol  prescription has expired. He has found that tramadol helps him sleep through the night. He has been taking Dilaudid  every three hours and prefers this schedule. He also prefers three doses of methadone  a day. He has been experiencing leakage from his feeding tube site.   The patient's caregiver inquires about paperwork for food stamps, indicating that the patient is unable to work. The patient is also scheduled to see palliative care.  I have reviewed the past medical history, past surgical history, social history and family history with the patient and they are unchanged from previous note.  HISTORY OF PRESENT ILLNESS:   Oncology History  Adenocarcinoma of gastroesophageal junction (HCC)  05/29/2023 Initial Diagnosis   Adenocarcinoma of gastroesophageal junction (HCC)   07/18/2023 Cancer Staging    Staging form: Esophagus - Adenocarcinoma, AJCC 8th Edition - Clinical: Stage IVB (cT4, cN3, cM1, G3) - Signed by [name], MD on 07/18/2023 Histologic grading system: 3 grade system   07/23/2023 -  Chemotherapy   Patient is on Treatment Plan : GASTROESOPHAGEAL FOLFOX + Nivolumab  q14d       48 y.o. gentleman with history of asthma, presented to the ED on 05/27/2023 with complaints of epigastric burning abdominal pain, nausea, retching.  He also reported some dark tarry stools for 3 days prior to arrival.  He was previously seen in the ED at [facility] on 05/08/2023 with complaints of chest or right upper quadrant abdominal pain.  Workup was unremarkable at that time and he was sent home with Protonix  and Carafate .  Since his symptoms persisted, he presented to the ED again.   In the ED, his hemoglobin was noted to be down to 10, compared to 13 previously.  With concern for upper GI bleed, was admitted for further evaluation and management.   Dr. Magod performed upper GI endoscopy on 05/28/2023.  It showed partially obstructing, likely malignant esophageal tumor in the lower third of the esophagus.  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  Normal duodenum.  Pathology from GE junction mass came back positive for invasive adenocarcinoma, moderately to poorly differentiated.  Immunostains pending.   CT chest abdomen pelvis on 05/28/2023 showed abnormal wall thickening at the  GE junction, compatible with patient's known carcinoma.  Prominent lymph nodes in the retrocrural region on the right side, adjacent to the hiatal hernia.  Abnormal low-density soft tissue in the gastrohepatic region, abutting the lesser curvature and proximal stomach, worrisome for metastatic disease.  Periportal, periceliac, gastrosplenic and left retroperitoneal lymphadenopathy, worrisome for metastatic involvement.  Hypodensities in the liver were also noted, worrisome for metastatic disease, largest 1 measuring 13 mm in  the inferior right lobe.  Fat stranding surrounding the body and tail of pancreas, suggesting acute pancreatitis.   We were consulted during his hospitalization on 05/29/2023 for additional recommendations given new diagnosis of GE junction adenocarcinoma.   On 06/07/2023, staging PET/CT showed large hypermetabolic mass involving the distal esophagus and proximal stomach consistent with known primary esophageal carcinoma.  Multiple hypermetabolic retroperitoneal, lower mediastinal lymph nodes consistent with locally advanced disease.  Suspected direct tumor extension into the pancreatic body.  Hypermetabolic node or peritoneal implant within the omentum.  No other evidence of metastatic disease.   Plan was for palliative systemic treatments with FOLFOX plus nivolumab .  However patient's performance status continued to decline and he had to be hospitalized again on 06/14/2023.  He finally presented to reestablish care in our clinic on 07/18/2023.  His performance status improved with nutrition via J-tube.  Started systemic treatments from 07/23/2023.  We did dose reduce oxaliplatin  by 20% and proceeded with 68 mg/m dose.  When he presented for cycle 2 on 08/07/2023, leukopenia with white count of 3300, ANC 1100.  We started skipping 5-FU bolus and continue with the dose reduced oxaliplatin .    REVIEW OF SYSTEMS:   Review of Systems - Oncology  All other pertinent systems were reviewed with the patient and are negative.  ALLERGIES: He has no known allergies.  MEDICATIONS:  Current Outpatient Medications  Medication Sig Dispense Refill   cyanocobalamin  1000 MCG tablet Take 1 tablet (1,000 mcg total) by mouth daily. 30 tablet 2   folic acid  (FOLVITE ) 1 MG tablet Take 1 tablet (1 mg total) by mouth daily. 30 tablet 2   [START ON 08/12/2023] HYDROmorphone  (DILAUDID ) 2 MG tablet Take 1 tablet (2 mg total) by mouth every 4 (four) hours as needed for severe pain (pain score 7-10). 30 tablet 0    lidocaine -prilocaine  (EMLA ) cream Apply to affected area once 30 g 3   methadone  (DOLOPHINE ) 5 MG tablet Take 1 tablet (5 mg total) by mouth every 12 (twelve) hours. 60 tablet 0   metoCLOPramide  (REGLAN ) 10 MG tablet Take 1 tablet (10 mg total) by mouth 4 (four) times daily -  before meals and at bedtime. 120 tablet 1   Nutritional Supplements (FEEDING SUPPLEMENT, OSMOLITE 1.5 CAL,) LIQD At 60 mL/h over 18 hours on a daily basis. 30000 mL 0   ondansetron  (ZOFRAN ) 8 MG tablet Take 1 tablet (8 mg total) by mouth every 8 (eight) hours as needed for nausea or vomiting. Start on the third day after chemotherapy. 30 tablet 1   pantoprazole  (PROTONIX ) 40 MG tablet Take 1 tablet (40 mg total) by mouth 2 (two) times daily. 60 tablet 1   polyethylene glycol powder (GLYCOLAX /MIRALAX ) 17 GM/SCOOP powder Dissolve 17 g in 4 oz of liquid and take by mouth daily. 476 g 0   potassium chloride  (KLOR-CON ) 20 MEQ packet Take 20 mEq by mouth daily. 30 packet 0   prochlorperazine  (COMPAZINE ) 10 MG tablet Take 1 tablet (10 mg total) by mouth every 6 (six) hours as needed for nausea or vomiting.  30 tablet 1   senna-docusate (SENOKOT-S) 8.6-50 MG tablet Take 2 tablets by mouth at bedtime. 60 tablet 2   sucralfate  (CARAFATE ) 1 GM/10ML suspension Take 10 mLs (1 g total) by mouth 4 (four) times daily -  with meals and at bedtime. 420 mL 0   Water  For Irrigation, Sterile (FREE WATER ) SOLN Place 100 mLs into feeding tube 4 (four) times daily. 12000 mL 1   albuterol  (PROVENTIL ) (5 MG/ML) 0.5% nebulizer solution Take 0.5 mLs (2.5 mg total) by nebulization every 6 (six) hours as needed for wheezing or shortness of breath. 20 mL 3   albuterol  (VENTOLIN  HFA) 108 (90 Base) MCG/ACT inhaler Inhale 1-2 puffs into the lungs every 4 (four) hours as needed for wheezing or shortness of breath. 18 g 3   furosemide  (LASIX ) 20 MG tablet Take 1 tablet (20 mg total) by mouth daily as needed for fluid or edema. 30 tablet 3   No current  facility-administered medications for this visit.     VITALS:   Blood pressure 127/79, pulse 80, temperature 98.1 F (36.7 C), temperature source Temporal, resp. rate 16, weight 168 lb 8 oz (76.4 kg), SpO2 97%.  Wt Readings from Last 3 Encounters:  08/07/23 168 lb 8 oz (76.4 kg)  08/02/23 169 lb 5 oz (76.8 kg)  07/29/23 176 lb 6.4 oz (80 kg)    Body mass index is 27.2 kg/m.   Onc Performance Status - 08/07/23 0931       ECOG Perf Status   ECOG Perf Status Ambulatory and capable of all selfcare but unable to carry out any work activities.  Up and about more than 50% of waking hours      KPS SCALE   KPS % SCORE Normal activity with effort, some s/s of disease             PHYSICAL EXAM:   Physical Exam Constitutional:      General: He is not in acute distress.    Appearance: Normal appearance.  HENT:     Head: Normocephalic and atraumatic.  Eyes:     General: No scleral icterus.    Conjunctiva/sclera: Conjunctivae normal.  Cardiovascular:     Rate and Rhythm: Normal rate and regular rhythm.     Heart sounds: Normal heart sounds.  Pulmonary:     Effort: Pulmonary effort is normal.     Breath sounds: Normal breath sounds.  Chest:     Comments: Port-A-Cath in place without signs of infection Abdominal:     General: There is no distension.     Comments: J-tube in place  Musculoskeletal:     Right lower leg: No edema.     Left lower leg: No edema.  Lymphadenopathy:     Cervical: No cervical adenopathy.  Neurological:     General: No focal deficit present.     Mental Status: He is oriented to person, place, and time.  Psychiatric:        Mood and Affect: Mood normal.        Behavior: Behavior normal.      LABORATORY DATA:   I have reviewed the data as listed.  Results for orders placed or performed in visit on 08/07/23  CBC with Differential (Cancer Center Only)  Result Value Ref Range   WBC Count 3.3 (L) 4.0 - 10.5 K/uL   RBC 3.91 (L) 4.22 - 5.81  MIL/uL   Hemoglobin 9.4 (L) 13.0 - 17.0 g/dL   HCT 69.0 (L) 60.9 - 47.9 %  MCV 79.0 (L) 80.0 - 100.0 fL   MCH 24.0 (L) 26.0 - 34.0 pg   MCHC 30.4 30.0 - 36.0 g/dL   RDW 81.8 (H) 88.4 - 84.4 %   Platelet Count 251 150 - 400 K/uL   nRBC 0.0 0.0 - 0.2 %   Neutrophils Relative % 32 %   Neutro Abs 1.1 (L) 1.7 - 7.7 K/uL   Lymphocytes Relative 37 %   Lymphs Abs 1.2 0.7 - 4.0 K/uL   Monocytes Relative 23 %   Monocytes Absolute 0.8 0.1 - 1.0 K/uL   Eosinophils Relative 7 %   Eosinophils Absolute 0.2 0.0 - 0.5 K/uL   Basophils Relative 1 %   Basophils Absolute 0.0 0.0 - 0.1 K/uL   Immature Granulocytes 0 %   Abs Immature Granulocytes 0.00 0.00 - 0.07 K/uL  CMP (Cancer Center only)  Result Value Ref Range   Sodium 136 135 - 145 mmol/L   Potassium 4.1 3.5 - 5.1 mmol/L   Chloride 103 98 - 111 mmol/L   CO2 29 22 - 32 mmol/L   Glucose, Bld 99 70 - 99 mg/dL   BUN 8 6 - 20 mg/dL   Creatinine 9.29 9.38 - 1.24 mg/dL   Calcium  8.8 (L) 8.9 - 10.3 mg/dL   Total Protein 6.4 (L) 6.5 - 8.1 g/dL   Albumin 3.2 (L) 3.5 - 5.0 g/dL   AST 18 15 - 41 U/L   ALT 13 0 - 44 U/L   Alkaline Phosphatase 97 38 - 126 U/L   Total Bilirubin 0.4 0.0 - 1.2 mg/dL   GFR, Estimated >39 >39 mL/min   Anion gap 4 (L) 5 - 15      RADIOGRAPHIC STUDIES:  I have personally reviewed the radiological images as listed and agree with the findings in the report.  DG Chest 2 View Result Date: 07/30/2023 CLINICAL DATA:  Chest pain.  Lung cancer. EXAM: CHEST - 2 VIEW COMPARISON:  Chest radiograph dated 05/08/2023. FINDINGS: Right-sided Port-A-Cath with tip at the cavoatrial junction. Small bilateral pleural effusions and bibasilar atelectasis. No consolidative changes. No pneumothorax. The cardiac silhouette is within normal limits. No acute osseous pathology. IMPRESSION: Small bilateral pleural effusions and bibasilar atelectasis. Electronically Signed   By: Vanetta Chou M.D.   On: 07/30/2023 10:26    CODE STATUS:  Code  Status History     Date Active Date Inactive Code Status Order ID Comments User Context   06/14/2023 1621 07/11/2023 2225 Full Code [id number]  Celinda Alm Lot, MD Inpatient   06/13/2023 1253 06/14/2023 0509 Full Code [id number]  Philip Cornet, MD [facility]   05/27/2023 1516 05/30/2023 1651 Full Code [id number]  Zella Katha HERO, MD ED    Questions for Most Recent Historical Code Status (Order [id number])     Question Answer   By: Consent: discussion documented in EHR            Orders Placed This Encounter  Procedures   CBC with Differential (Cancer Center Only)    Standing Status:   Future    Expected Date:   09/04/2023    Expiration Date:   09/03/2024   CMP (Cancer Center only)    Standing Status:   Future    Expected Date:   09/04/2023    Expiration Date:   09/03/2024   CBC with Differential (Cancer Center Only)    Standing Status:   Future    Expected Date:   09/18/2023    Expiration Date:   09/17/2024  CMP (Cancer Center only)    Standing Status:   Future    Expected Date:   09/18/2023    Expiration Date:   09/17/2024   CBC with Differential (Cancer Center Only)    Standing Status:   Future    Expected Date:   10/02/2023    Expiration Date:   10/01/2024   CMP (Cancer Center only)    Standing Status:   Future    Expected Date:   10/02/2023    Expiration Date:   10/01/2024   T4    Standing Status:   Future    Expected Date:   10/02/2023    Expiration Date:   10/01/2024   TSH    Standing Status:   Future    Expected Date:   10/02/2023    Expiration Date:   10/01/2024     Future Appointments  Date Time Provider Department Center  08/07/2023 10:00 AM [facility] INFUSION [facility] None  08/07/2023 10:30 AM Ivonne Harlene RAMAN, RD [facility] None  08/09/2023  1:15 PM [facility] FLUSH [facility] None  08/21/2023  8:30 AM [facility] FLUSH [facility] None  08/21/2023  9:00 AM Marquesa Rath, MD [facility] None  08/21/2023 10:00 AM [facility] INFUSION [facility] None  08/21/2023 11:30 AM  [facility] PALLIATIVE CARE [facility] None  08/23/2023  1:15 PM [facility] FLUSH [facility] None  08/28/2023  1:40 PM [name], NP [facility] None     This document was completed utilizing speech recognition software. Grammatical errors, random word insertions, pronoun errors, and incomplete sentences are an occasional consequence of this system due to software limitations, ambient noise, and hardware issues. Any formal questions or concerns about the content, text or information contained within the body of this dictation should be directly addressed to the provider for clarification.

## 2023-08-07 NOTE — Progress Notes (Signed)
 Nutrition Follow-up:  Patient with metastatic adenocarcinoma of GE junction. He is receiving palliative Folfox + Nivolumab  q14d. Patient is under the care of Dr. Autumn.  1/28-1/31 hospital admit - UGI; acute/chronic blood loss anemia  12/13-1/9 hospital admit - FTT -S/p open Jtube 12/17 (DME: [employer])  Met with patient in infusion. Present at visit is the mother of his children. Patient reports recently moving in with her as it was not working out living with daughter. He has been eating more and really likes chicken pot pie. He has not been giving tube feedings as prescribed. Per caregiver, he has not hooked up to pump much in the last few days. She has decreased water  flush due to complaints of feeling full. Currently receiving 30 ml before and 40-50 ml after. Patient reports adaptor leaks and needs a new one. Patient is drinking gatorade, grape, apple juices by mouth. Patient is uncomfortable sitting in chair with frequent adjustments. States it hurts his butt.    Medications: lasix  20 mg (PRN), remeron   Labs: albumin 3.2  Anthropometrics: Wt 168 lb 8 oz today decreased   1/21 - 173 lb 6 oz 1/16 - 175 lb 1.6 oz  12/11 - 174 lb 6.4 oz    Estimated Energy Needs  Kcals: 2350-2750 Protein: 115-130 Fluid: >2.3 L  NUTRITION DIAGNOSIS: Inadequate oral intake continues - supplementing with TF   MALNUTRITION DIAGNOSIS: Moderate malnutrition continues    INTERVENTION:  Encouraged oral intake of soft moist foods high in calories and protein - shake recipes, snack ideas, soft high protein foods, high calorie recipes provided Suggested oral intake within first hour of waking followed by bites/sips q2h  Encouraged ONS as tolerated - samples of Ensure Complete, Boost Breeze, CIB powder provided Encouraged overnight feeds (Osmolite 1.5 @ 60 ml/hr) to promote wt stability/gain Educated to contact [employer] (DME) for [employer] Estimated Needs + Nutrition prescription written down for  caregiver, contact information provided  Encouraged activity as able  Support and encouragement    MONITORING, EVALUATION, GOAL: wt trends, intake, TF   NEXT VISIT: Wednesday February 19 during infusion

## 2023-08-07 NOTE — Patient Instructions (Signed)
 [facility] - A DEPT OF [facility]  Discharge Instructions: Thank you for choosing [facility] to provide your oncology and hematology care.   If you have a lab appointment with the Cancer Center, please go directly to the Cancer Center and check in at the registration area.   Wear comfortable clothing and clothing appropriate for easy access to any Portacath or PICC line.   We strive to give you quality time with your provider. You may need to reschedule your appointment if you arrive late (15 or more minutes).  Arriving late affects you and other patients whose appointments are after yours.  Also, if you miss three or more appointments without notifying the office, you may be dismissed from the clinic at the provider's discretion.      For prescription refill requests, have your pharmacy contact our office and allow 72 hours for refills to be completed.    Today you received the following chemotherapy and/or immunotherapy agents Opdivo , oxaliplatin , leucovorin , fluorouracil .      To help prevent nausea and vomiting after your treatment, we encourage you to take your nausea medication as directed.  BELOW ARE SYMPTOMS THAT SHOULD BE REPORTED IMMEDIATELY: *FEVER GREATER THAN 100.4 F (38 C) OR HIGHER *CHILLS OR SWEATING *NAUSEA AND VOMITING THAT IS NOT CONTROLLED WITH YOUR NAUSEA MEDICATION *UNUSUAL SHORTNESS OF BREATH *UNUSUAL BRUISING OR BLEEDING *URINARY PROBLEMS (pain or burning when urinating, or frequent urination) *BOWEL PROBLEMS (unusual diarrhea, constipation, pain near the anus) TENDERNESS IN MOUTH AND THROAT WITH OR WITHOUT PRESENCE OF ULCERS (sore throat, sores in mouth, or a toothache) UNUSUAL RASH, SWELLING OR PAIN  UNUSUAL VAGINAL DISCHARGE OR ITCHING   Items with * indicate a potential emergency and should be followed up as soon as possible or go to the Emergency Department if any problems should occur.  Please show the  CHEMOTHERAPY ALERT CARD or IMMUNOTHERAPY ALERT CARD at check-in to the Emergency Department and triage nurse.  Should you have questions after your visit or need to cancel or reschedule your appointment, please contact [facility] - [facility]  Dept: [phone]  and follow the prompts.  Office hours are 8:00 a.m. to 4:30 p.m. Monday - Friday. Please note that voicemails left after 4:00 p.m. may not be returned until the following business day.  We are closed weekends and major holidays. You have access to a nurse at all times for urgent questions. Please call the main number to the clinic Dept: [phone] and follow the prompts.   For any non-urgent questions, you may also contact your provider using MyChart. We now offer e-Visits for anyone 15 and older to request care online for non-urgent symptoms. For details visit [email or web address].   Also download the MyChart app! Go to the app store, search MyChart, open the app, select [place], and log in with your MyChart username and password.

## 2023-08-07 NOTE — Assessment & Plan Note (Addendum)
-  Please review HPI/oncology history for additional details and timeline of events.  -Reviewed staging PET/CT findings with the patient previously.  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  Discussed treatment options. All treatment options are palliative in nature and not curative intent. Plan made to proceed with palliative systemic treatment using FOLFOX plus nivolumab .   -He started palliative systemic treatments with FOLFOX plus nivolumab  from 07/23/2023.  He felt that he tolerated cycle 1 fairly well.  He was admitted to the hospital on 07/30/2023 after he presented with chest and epigastric pain, episodes of melena. He was found to have hemoglobin of 6.8. He was admitted for blood transfusion and further evaluation.  GI deferred additional workup/EGD since the cause of blood loss seemed obvious from malignancy.  -He was discharged home on 08/02/2023.  His performance status is slowly improving.  He is able to eat better.  Not reliant on tube feeds as much now.  He is walking and taking stairs.  -His hemoglobin is stable at 9.4 today.  However white count is decreased at 3300.  ANC is 1100.  We can proceed with cycle 2 of FOLFOX plus nivolumab  today but we will skip 5-FU bolus today.  He will continue to receive oxaliplatin  at 68 mg/m, 20% dose reduction.  Depending on blood counts, we will adjust dosage and frequency if needed.  For now I will plan to see him back in 2 weeks for labs, follow-up and continuation of chemotherapy.  He was advised to call us  in the interim if any questions or concerns or concerning symptoms.

## 2023-08-07 NOTE — Assessment & Plan Note (Addendum)
-  Continue to monitor CBCD with each visit and transfuse as needed to maintain hemoglobin closer to 8.  Currently hemoglobin is stable at 9.4.

## 2023-08-07 NOTE — Assessment & Plan Note (Signed)
 -  Continue aggressive pain management.  He is currently on Dilaudid and methadone.  We greatly appreciate palliative care team's assistance in his management.

## 2023-08-08 ENCOUNTER — Other Ambulatory Visit (HOSPITAL_COMMUNITY): Payer: Self-pay

## 2023-08-09 ENCOUNTER — Inpatient Hospital Stay: Payer: Medicaid Other

## 2023-08-09 VITALS — BP 108/79 | HR 80 | Temp 98.9°F | Resp 18

## 2023-08-09 DIAGNOSIS — Z5111 Encounter for antineoplastic chemotherapy: Secondary | ICD-10-CM | POA: Diagnosis not present

## 2023-08-09 DIAGNOSIS — Z95828 Presence of other vascular implants and grafts: Secondary | ICD-10-CM

## 2023-08-09 MED ORDER — SODIUM CHLORIDE 0.9% FLUSH
10.0000 mL | Freq: Once | INTRAVENOUS | Status: AC
  Administered 2023-08-09: 10 mL

## 2023-08-09 MED ORDER — HEPARIN SOD (PORK) LOCK FLUSH 100 UNIT/ML IV SOLN
500.0000 [IU] | Freq: Once | INTRAVENOUS | Status: AC
  Administered 2023-08-09: 500 [IU]

## 2023-08-10 ENCOUNTER — Other Ambulatory Visit (HOSPITAL_COMMUNITY): Payer: Self-pay

## 2023-08-11 ENCOUNTER — Other Ambulatory Visit: Payer: Self-pay

## 2023-08-12 ENCOUNTER — Encounter: Payer: Self-pay | Admitting: Oncology

## 2023-08-12 ENCOUNTER — Other Ambulatory Visit: Payer: Self-pay | Admitting: Oncology

## 2023-08-12 ENCOUNTER — Other Ambulatory Visit (HOSPITAL_COMMUNITY): Payer: Self-pay

## 2023-08-12 ENCOUNTER — Other Ambulatory Visit: Payer: Self-pay

## 2023-08-12 DIAGNOSIS — C160 Malignant neoplasm of cardia: Secondary | ICD-10-CM

## 2023-08-12 NOTE — Telephone Encounter (Signed)
 Hi Dr. Randye Buttner,  Long discussion with patient and family. He would like to continue with treatment for now and scans. It seems he was having a pain episode on yesterday and not feeling well which made him feel that he did not want to continue. He understands we will manage symptoms as best possible to allow him to continue to do as well as he can for as long as he can.   Thank you,  Landa Pine

## 2023-08-13 ENCOUNTER — Telehealth: Payer: Self-pay | Admitting: Oncology

## 2023-08-13 NOTE — Telephone Encounter (Signed)
Jesus Haynes

## 2023-08-15 ENCOUNTER — Inpatient Hospital Stay (HOSPITAL_BASED_OUTPATIENT_CLINIC_OR_DEPARTMENT_OTHER): Payer: Medicaid Other | Admitting: Oncology

## 2023-08-15 ENCOUNTER — Inpatient Hospital Stay: Payer: Medicaid Other

## 2023-08-15 ENCOUNTER — Other Ambulatory Visit: Payer: Self-pay | Admitting: Oncology

## 2023-08-15 ENCOUNTER — Encounter: Payer: Self-pay | Admitting: Oncology

## 2023-08-15 VITALS — BP 120/92 | HR 79 | Temp 98.6°F | Resp 19 | Ht 66.0 in | Wt 170.3 lb

## 2023-08-15 DIAGNOSIS — C160 Malignant neoplasm of cardia: Secondary | ICD-10-CM

## 2023-08-15 DIAGNOSIS — Z5111 Encounter for antineoplastic chemotherapy: Secondary | ICD-10-CM | POA: Diagnosis not present

## 2023-08-15 DIAGNOSIS — D500 Iron deficiency anemia secondary to blood loss (chronic): Secondary | ICD-10-CM

## 2023-08-15 DIAGNOSIS — Z95828 Presence of other vascular implants and grafts: Secondary | ICD-10-CM

## 2023-08-15 DIAGNOSIS — G893 Neoplasm related pain (acute) (chronic): Secondary | ICD-10-CM

## 2023-08-15 LAB — CMP (CANCER CENTER ONLY)
ALT: 9 U/L (ref 0–44)
AST: 13 U/L — ABNORMAL LOW (ref 15–41)
Albumin: 3 g/dL — ABNORMAL LOW (ref 3.5–5.0)
Alkaline Phosphatase: 75 U/L (ref 38–126)
Anion gap: 4 — ABNORMAL LOW (ref 5–15)
BUN: 5 mg/dL — ABNORMAL LOW (ref 6–20)
CO2: 31 mmol/L (ref 22–32)
Calcium: 8.5 mg/dL — ABNORMAL LOW (ref 8.9–10.3)
Chloride: 103 mmol/L (ref 98–111)
Creatinine: 0.64 mg/dL (ref 0.61–1.24)
GFR, Estimated: >60 mL/min (ref >60)
Glucose, Bld: 95 mg/dL (ref 70–99)
Potassium: 3.5 mmol/L (ref 3.5–5.1)
Sodium: 138 mmol/L (ref 135–145)
Total Bilirubin: 0.3 mg/dL (ref 0.0–1.2)
Total Protein: 6 g/dL — ABNORMAL LOW (ref 6.5–8.1)

## 2023-08-15 LAB — CBC WITH DIFFERENTIAL (CANCER CENTER ONLY)
Abs Immature Granulocytes: 0.01 10*3/uL (ref 0.00–0.07)
Basophils Absolute: 0 10*3/uL (ref 0.0–0.1)
Basophils Relative: 1 %
Eosinophils Absolute: 0.2 10*3/uL (ref 0.0–0.5)
Eosinophils Relative: 4 %
HCT: 30.6 % — ABNORMAL LOW (ref 39.0–52.0)
Hemoglobin: 9.4 g/dL — ABNORMAL LOW (ref 13.0–17.0)
Immature Granulocytes: 0 %
Lymphocytes Relative: 31 %
Lymphs Abs: 1.7 10*3/uL (ref 0.7–4.0)
MCH: 24 pg — ABNORMAL LOW (ref 26.0–34.0)
MCHC: 30.7 g/dL (ref 30.0–36.0)
MCV: 78.1 fL — ABNORMAL LOW (ref 80.0–100.0)
Monocytes Absolute: 0.5 10*3/uL (ref 0.1–1.0)
Monocytes Relative: 9 %
Neutro Abs: 3 10*3/uL (ref 1.7–7.7)
Neutrophils Relative %: 55 %
Platelet Count: 265 10*3/uL (ref 150–400)
RBC: 3.92 MIL/uL — ABNORMAL LOW (ref 4.22–5.81)
RDW: 18.7 % — ABNORMAL HIGH (ref 11.5–15.5)
WBC Count: 5.5 10*3/uL (ref 4.0–10.5)
nRBC: 0 % (ref 0.0–0.2)

## 2023-08-15 LAB — MAGNESIUM: Magnesium: 1.8 mg/dL (ref 1.7–2.4)

## 2023-08-15 MED ORDER — SODIUM CHLORIDE 0.9% FLUSH
10.0000 mL | Freq: Once | INTRAVENOUS | Status: AC
  Administered 2023-08-15: 10 mL

## 2023-08-15 MED ORDER — HEPARIN SOD (PORK) LOCK FLUSH 100 UNIT/ML IV SOLN
500.0000 [IU] | Freq: Once | INTRAVENOUS | Status: AC
  Administered 2023-08-15: 500 [IU]

## 2023-08-15 NOTE — Assessment & Plan Note (Signed)
-  Continue to monitor CBCD with each visit and transfuse as needed to maintain hemoglobin closer to 8.  Currently hemoglobin is stable at 9.4.

## 2023-08-15 NOTE — Assessment & Plan Note (Addendum)
-  Please review HPI/oncology history for additional details and timeline of events.  -Reviewed staging PET/CT findings with the patient previously.  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  Discussed treatment options. All treatment options are palliative in nature and not curative intent. Plan made to proceed with palliative systemic treatment using FOLFOX plus nivolumab.   -He started palliative systemic treatments with FOLFOX plus nivolumab from 07/23/2023.  He was admitted to the hospital on 07/30/2023 after he presented with chest and epigastric pain, episodes of melena. He was found to have hemoglobin of 6.8. He was admitted for blood transfusion and further evaluation.  GI deferred additional workup/EGD since the cause of blood loss seemed obvious from malignancy.  -He was discharged home on 08/02/2023.  His performance status is slowly improving.  He is able to eat better.  Not reliant on tube feeds as much now.  He is walking and taking stairs.  -He received cycle 2 of FOLFOX plus nivolumab on 08/07/2023 without 5-FU bolus and a 20% dose reduction of oxaliplatin and tolerated the chemotherapy and immunotherapy reasonably well.  Did not experience major side effects.  Today's appointment was scheduled as per the patient's request and family members request because patient was contemplating on going on hospice last week.  Patient states that it was mainly because of inadequate pain control last week but his outlook and pain control is better today.  He would like to continue with treatments.  Previously we requested PET scan as per their wishes (for decision making) but we will move this out at least a month, since he only had 2 cycles of chemotherapy.  -His hemoglobin is stable at 9.4 today. His WBC count is 5,500 with normal differential.  Platelet count 265,000.  He will be due for cycle 3 of  chemoimmunotherapy next week.  He will continue to receive oxaliplatin at 68 mg/m, 20% dose reduction.  Will consider resuming 5-FU bolus depending on symptoms and blood counts. We will adjust dosage and frequency if needed.  For now I will plan to see him back in 1 week for labs, follow-up and continuation of chemotherapy.  He was advised to call us in the interim if any questions or concerns or concerning symptoms.

## 2023-08-15 NOTE — Progress Notes (Signed)
[facility]  ONCOLOGY CLINIC PROGRESS NOTE   Patient Care Team: Ivonne Andrew, NP as PCP - General (Pulmonary Disease) Clemons, Edmund Hilda Care Management Rand Etchison, Archie Patten, MD as Consulting Physician (Oncology) Vida Rigger, MD as Consulting Physician (Gastroenterology) Pickenpack-Cousar, Arty Baumgartner, NP as Nurse Practitioner [facility])  PATIENT NAME: Jesus Haynes   MR#: [id number] DOB: [birth year]  Date of visit: 08/15/2023   ASSESSMENT & PLAN:   Jesus Haynes is a 48 y.o. pleasant gentleman with history of asthma, presented to the ED on 05/27/2023 with complaints of epigastric burning abdominal pain, nausea, retching.  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  Adenocarcinoma of gastroesophageal junction (HCC) -Please review HPI/oncology history for additional details and timeline of events.  -Reviewed staging PET/CT findings with the patient previously.  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  Discussed treatment options. All treatment options are palliative in nature and not curative intent. Plan made to proceed with palliative systemic treatment using FOLFOX plus nivolumab.   -He started palliative systemic treatments with FOLFOX plus nivolumab from 07/23/2023.  He was admitted to the hospital on 07/30/2023 after he presented with chest and epigastric pain, episodes of melena. He was found to have hemoglobin of 6.8. He was admitted for blood transfusion and further evaluation.  GI deferred additional workup/EGD since the cause of blood loss seemed obvious from malignancy.  -He was discharged home on 08/02/2023.  His performance status is slowly improving.  He is able to eat better.  Not reliant  on tube feeds as much now.  He is walking and taking stairs.  -He received cycle 2 of FOLFOX plus nivolumab on 08/07/2023 without 5-FU bolus and a 20% dose reduction of oxaliplatin and tolerated the chemotherapy and immunotherapy reasonably well.  Did not experience major side effects.  Today's appointment was scheduled as per the patient's request and family members request because patient was contemplating on going on hospice last week.  Patient states that it was mainly because of inadequate pain control last week but his outlook and pain control is better today.  He would like to continue with treatments.  Previously we requested PET scan as per their wishes (for decision making) but we will move this out at least a month, since he only had 2 cycles of chemotherapy.  -His hemoglobin is stable at 9.4 today. His WBC count is 5,500 with normal differential.  Platelet count 265,000.  He will be due for cycle 3 of chemoimmunotherapy next week.  He will continue to receive oxaliplatin at 68 mg/m, 20% dose reduction.  Will consider resuming 5-FU bolus depending on symptoms and blood counts. We will adjust dosage and frequency if needed.  For now I will plan to see him back in 1 week for labs, follow-up and continuation of chemotherapy.  He was advised to call us in the interim if any questions or concerns or concerning symptoms.  Cancer associated pain - Continue aggressive pain management.  He is currently on Dilaudid and methadone.  We greatly appreciate palliative care team's assistance in his management.  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  Currently hemoglobin is stable at 9.4.    I  reviewed lab results and outside records for this visit and discussed relevant results with the patient. Diagnosis, plan of care and treatment options were also discussed in detail with the patient. Opportunity  provided to ask questions and answers provided to his apparent satisfaction. Provided instructions to call our clinic with any problems, questions or concerns prior to return visit. I recommended to continue follow-up with PCP and sub-specialists. He verbalized understanding and agreed with the plan.   NCCN guidelines have been consulted in the planning of this patient's care.  I spent a total of 40 minutes during this encounter with the patient including review of chart and various tests results, discussions about plan of care and coordination of care plan.   Meryl Crutch, MD  08/15/2023 3:06 PM  [facility] - A DEPT OF [facility] 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  INTERVAL HISTORY:    Discussed the use of AI scribe software for clinical note transcription with the patient, who gave verbal consent to proceed.   Jesus Haynes is here today for repeat clinical assessment, as per the request of the patient and his family members.  He was contemplating on going on hospice last week because of persistent abdominal pain.  He has changed his mind now.  He reports ongoing significant pain. The pain is primarily located in the stomach area and has been managed with methadone and Dilaudid. The patient reports that the last round of chemotherapy was "rough," but the pain became more manageable after adjustments to the home pain management regimen. The patient is currently taking methadone twice a day and Dilaudid every three hours. The patient's pain has been a significant concern, but the patient and caregiver express understanding of the  aggressive nature of the disease and the difficulty in completely eliminating the pain. The patient has been eating well, which is a positive sign.  I have reviewed the past medical history, past surgical history, social history and family history with the patient and they are unchanged from previous note.  HISTORY OF PRESENT ILLNESS:   Oncology History  Adenocarcinoma of gastroesophageal junction (HCC)  05/29/2023 Initial Diagnosis   Adenocarcinoma of gastroesophageal junction (HCC)   07/18/2023 Cancer Staging   Staging form: Esophagus - Adenocarcinoma, AJCC 8th Edition - Clinical: Stage IVB (cT4, cN3, cM1, G3) - Signed by Meryl Crutch, MD on 07/18/2023 Histologic grading system: 3 grade system   07/23/2023 -  Chemotherapy   Patient is on Treatment Plan : GASTROESOPHAGEAL FOLFOX + Nivolumab q14d       48 y.o. gentleman with history of asthma, presented to the ED on 05/27/2023 with complaints of epigastric burning abdominal pain, nausea, retching.  He also reported some dark tarry stools for 3 days prior to arrival.  He was previously seen in the ED at [facility] on 05/08/2023 with complaints of chest or right upper quadrant abdominal pain.  Workup was unremarkable at that time and he was sent home with Protonix and Carafate.  Since his symptoms persisted, he presented to the ED again.   In the ED, his hemoglobin was noted to be down to 10, compared to 13 previously.  With concern for  upper GI bleed, was admitted for further evaluation and management.   Dr. Ewing Schlein performed upper GI endoscopy on 05/28/2023.  It showed partially obstructing, likely malignant esophageal tumor in the lower third of the esophagus.  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  Normal duodenum.  Pathology from GE junction mass came back positive for invasive adenocarcinoma, moderately to poorly differentiated.  Immunostains pending.   CT chest abdomen pelvis on 05/28/2023 showed abnormal wall thickening at the GE  junction, compatible with patient's known carcinoma.  Prominent lymph nodes in the retrocrural region on the right side, adjacent to the hiatal hernia.  Abnormal low-density soft tissue in the gastrohepatic region, abutting the lesser curvature and proximal stomach, worrisome for metastatic disease.  Periportal, periceliac, gastrosplenic and left retroperitoneal lymphadenopathy, worrisome for metastatic involvement.  Hypodensities in the liver were also noted, worrisome for metastatic disease, largest 1 measuring 13 mm in the inferior right lobe.  Fat stranding surrounding the body and tail of pancreas, suggesting acute pancreatitis.   We were consulted during his hospitalization on 05/29/2023 for additional recommendations given new diagnosis of GE junction adenocarcinoma.   On 06/07/2023, staging PET/CT showed large hypermetabolic mass involving the distal esophagus and proximal stomach consistent with known primary esophageal carcinoma.  Multiple hypermetabolic retroperitoneal, lower mediastinal lymph nodes consistent with locally advanced disease.  Suspected direct tumor extension into the pancreatic body.  Hypermetabolic node or peritoneal implant within the omentum.  No other evidence of metastatic disease.   Plan was for palliative systemic treatments with FOLFOX plus nivolumab.  However patient's performance status continued to decline and he had to be hospitalized again on 06/14/2023.  He finally presented to reestablish care in our clinic on 07/18/2023.  His performance status improved with nutrition via J-tube.  Started systemic treatments from 07/23/2023.  We did dose reduce oxaliplatin by 20% and proceeded with 68 mg/m dose.  When he presented for cycle 2 on 08/07/2023, leukopenia with white count of 3300, ANC 1100.  We started skipping 5-FU bolus and continue with the dose reduced oxaliplatin.    REVIEW OF SYSTEMS:   Review of Systems - Oncology  All other pertinent systems were reviewed  with the patient and are negative.  ALLERGIES: He has no known allergies.  MEDICATIONS:  Current Outpatient Medications  Medication Sig Dispense Refill   albuterol (PROVENTIL) (2.5 MG/3ML) 0.083% nebulizer solution Take 3 mLs (2.5 mg total) by nebulization every 6 (six) hours as needed for wheezing or shortness of breath. 75 mL 12   albuterol (VENTOLIN HFA) 108 (90 Base) MCG/ACT inhaler Inhale 1-2 puffs into the lungs every 4 (four) hours as needed for wheezing or shortness of breath. 18 g 3   cyanocobalamin 1000 MCG tablet Take 1 tablet (1,000 mcg total) by mouth daily. 30 tablet 2   folic acid (FOLVITE) 1 MG tablet Take 1 tablet (1 mg total) by mouth daily. 30 tablet 2   furosemide (LASIX) 20 MG tablet Take 1 tablet (20 mg total) by mouth daily as needed for fluid or edema. 30 tablet 3   HYDROmorphone (DILAUDID) 2 MG tablet Take 1 tablet (2 mg total) by mouth every 4 (four) hours as needed for severe pain (pain score 7-10). 90 tablet 0   lidocaine-prilocaine (EMLA) cream Apply to affected area once 30 g 3   methadone (DOLOPHINE) 5 MG tablet Take 1 tablet (5 mg total) by mouth every 12 (twelve) hours. 60 tablet 0   metoCLOPramide (REGLAN) 10 MG tablet Take 1 tablet (10  mg total) by mouth 4 (four) times daily -  before meals and at bedtime. 120 tablet 1   mirtazapine (REMERON) 7.5 MG tablet Take 1 tablet (7.5 mg total) by mouth at bedtime. 30 tablet 1   Nutritional Supplements (FEEDING SUPPLEMENT, OSMOLITE 1.5 CAL,) LIQD At 60 mL/h over 18 hours on a daily basis. 30000 mL 0   ondansetron (ZOFRAN) 8 MG tablet Take 1 tablet (8 mg total) by mouth every 8 (eight) hours as needed for nausea or vomiting. Start on the third day after chemotherapy. 30 tablet 1   pantoprazole (PROTONIX) 40 MG tablet Take 1 tablet (40 mg total) by mouth 2 (two) times daily. 60 tablet 1   polyethylene glycol powder (GLYCOLAX/MIRALAX) 17 GM/SCOOP powder Dissolve 17 g in 4 oz of liquid and take by mouth daily. 476 g 0    potassium chloride (KLOR-CON) 20 MEQ packet Take 20 mEq by mouth daily. 30 packet 0   prochlorperazine (COMPAZINE) 10 MG tablet Take 1 tablet (10 mg total) by mouth every 6 (six) hours as needed for nausea or vomiting. 30 tablet 1   senna-docusate (SENOKOT-S) 8.6-50 MG tablet Take 2 tablets by mouth at bedtime. 60 tablet 2   sucralfate (CARAFATE) 1 GM/10ML suspension Take 10 mLs (1 g total) by mouth 4 (four) times daily -  with meals and at bedtime. 420 mL 2   Water For Irrigation, Sterile (FREE WATER) SOLN Place 100 mLs into feeding tube 4 (four) times daily. 12000 mL 1   No current facility-administered medications for this visit.     VITALS:   Blood pressure (!) 120/92, pulse 79, temperature 98.6 F (37 C), temperature source Temporal, resp. rate 19, height 5\' 6"  (1.676 m), weight 170 lb 4.8 oz (77.2 kg), SpO2 98%.  Wt Readings from Last 3 Encounters:  08/15/23 170 lb 4.8 oz (77.2 kg)  08/07/23 168 lb 8 oz (76.4 kg)  08/02/23 169 lb 5 oz (76.8 kg)    Body mass index is 27.49 kg/m.   Onc Performance Status - 08/15/23 1234       ECOG Perf Status   ECOG Perf Status Ambulatory and capable of all selfcare but unable to carry out any work activities.  Up and about more than 50% of waking hours      KPS SCALE   KPS % SCORE Cares for self, unable to carry on normal activity or to do active work              PHYSICAL EXAM:   Physical Exam Constitutional:      General: He is not in acute distress.    Appearance: Normal appearance.  HENT:     Head: Normocephalic and atraumatic.  Eyes:     General: No scleral icterus.    Conjunctiva/sclera: Conjunctivae normal.  Cardiovascular:     Rate and Rhythm: Normal rate and regular rhythm.     Heart sounds: Normal heart sounds.  Pulmonary:     Effort: Pulmonary effort is normal.     Breath sounds: Normal breath sounds.  Chest:     Comments: Port-A-Cath in place without signs of infection Abdominal:     General: There is no  distension.     Comments: J-tube in place  Musculoskeletal:     Right lower leg: No edema.     Left lower leg: No edema.  Lymphadenopathy:     Cervical: No cervical adenopathy.  Neurological:     General: No focal deficit present.  Mental Status: He is oriented to person, place, and time.  Psychiatric:        Mood and Affect: Mood normal.        Behavior: Behavior normal.      LABORATORY DATA:   I have reviewed the data as listed.  Results for orders placed or performed in visit on 08/15/23  Magnesium  Result Value Ref Range   Magnesium 1.8 1.7 - 2.4 mg/dL  CMP (Cancer Center only)  Result Value Ref Range   Sodium 138 135 - 145 mmol/L   Potassium 3.5 3.5 - 5.1 mmol/L   Chloride 103 98 - 111 mmol/L   CO2 31 22 - 32 mmol/L   Glucose, Bld 95 70 - 99 mg/dL   BUN 5 (L) 6 - 20 mg/dL   Creatinine 1.61 0.96 - 1.24 mg/dL   Calcium 8.5 (L) 8.9 - 10.3 mg/dL   Total Protein 6.0 (L) 6.5 - 8.1 g/dL   Albumin 3.0 (L) 3.5 - 5.0 g/dL   AST 13 (L) 15 - 41 U/L   ALT 9 0 - 44 U/L   Alkaline Phosphatase 75 38 - 126 U/L   Total Bilirubin 0.3 0.0 - 1.2 mg/dL   GFR, Estimated >04 >54 mL/min   Anion gap 4 (L) 5 - 15  CBC with Differential (Cancer Center Only)  Result Value Ref Range   WBC Count 5.5 4.0 - 10.5 K/uL   RBC 3.92 (L) 4.22 - 5.81 MIL/uL   Hemoglobin 9.4 (L) 13.0 - 17.0 g/dL   HCT 09.8 (L) 11.9 - 14.7 %   MCV 78.1 (L) 80.0 - 100.0 fL   MCH 24.0 (L) 26.0 - 34.0 pg   MCHC 30.7 30.0 - 36.0 g/dL   RDW 82.9 (H) 56.2 - 13.0 %   Platelet Count 265 150 - 400 K/uL   nRBC 0.0 0.0 - 0.2 %   Neutrophils Relative % 55 %   Neutro Abs 3.0 1.7 - 7.7 K/uL   Lymphocytes Relative 31 %   Lymphs Abs 1.7 0.7 - 4.0 K/uL   Monocytes Relative 9 %   Monocytes Absolute 0.5 0.1 - 1.0 K/uL   Eosinophils Relative 4 %   Eosinophils Absolute 0.2 0.0 - 0.5 K/uL   Basophils Relative 1 %   Basophils Absolute 0.0 0.0 - 0.1 K/uL   Immature Granulocytes 0 %   Abs Immature Granulocytes 0.01 0.00 - 0.07  K/uL       RADIOGRAPHIC STUDIES:  I have personally reviewed the radiological images as listed and agree with the findings in the report.  DG Chest 2 View Result Date: 07/30/2023 CLINICAL DATA:  Chest pain.  Lung cancer. EXAM: CHEST - 2 VIEW COMPARISON:  Chest radiograph dated 05/08/2023. FINDINGS: Right-sided Port-A-Cath with tip at the cavoatrial junction. Small bilateral pleural effusions and bibasilar atelectasis. No consolidative changes. No pneumothorax. The cardiac silhouette is within normal limits. No acute osseous pathology. IMPRESSION: Small bilateral pleural effusions and bibasilar atelectasis. Electronically Signed   By: Elgie Collard M.D.   On: 07/30/2023 10:26    CODE STATUS:  Code Status History     Date Active Date Inactive Code Status Order ID Comments User Context   06/14/2023 1621 07/11/2023 2225 Full Code [id number]  Bobette Mo, MD Inpatient   06/13/2023 1253 06/14/2023 0509 Full Code [id number]  Richarda Overlie, MD [facility]   05/27/2023 1516 05/30/2023 1651 Full Code [id number]  Maryln Gottron, MD ED    Questions for Most Recent Historical Code  Status (Order [id number])     Question Answer   By: Consent: discussion documented in EHR            Orders Placed This Encounter  Procedures   Ambulatory referral to Radiation Oncology    Referral Priority:   Routine    Referral Type:   Consultation    Referral Reason:   Specialty Services Required    Requested Specialty:   Radiation Oncology    Number of Visits Requested:   1     Future Appointments  Date Time Provider Department Center  08/21/2023  8:30 AM [facility] FLUSH [facility] None  08/21/2023  9:00 AM Rhodesia Stanger, MD [facility] None  08/21/2023 10:15 AM [facility] INFUSION [facility] None  08/21/2023  3:00 PM [facility] PET CT 1 [facility] [place]  08/23/2023  2:00 PM [facility] FLUSH [facility] None  08/28/2023  1:40 PM Ivonne Andrew, NP [facility] None     This document was completed  utilizing speech recognition software. Grammatical errors, random word insertions, pronoun errors, and incomplete sentences are an occasional consequence of this system due to software limitations, ambient noise, and hardware issues. Any formal questions or concerns about the content, text or information contained within the body of this dictation should be directly addressed to the provider for clarification.

## 2023-08-15 NOTE — Assessment & Plan Note (Signed)
-  Continue aggressive pain management.  He is currently on Dilaudid and methadone.  We greatly appreciate palliative care team's assistance in his management.

## 2023-08-19 ENCOUNTER — Telehealth: Payer: Self-pay | Admitting: Radiation Oncology

## 2023-08-19 NOTE — Telephone Encounter (Signed)
 Received a call from the patient's power of attorney to schedule consultation w. Dr. Mitzi Hansen. She requested an appointment delay for the patient to be seen 2/27, scheduled as advised.

## 2023-08-19 NOTE — Telephone Encounter (Signed)
 Called patient to schedule a consultation w. Dr. Mitzi Hansen. No answer, LVM for a return call.

## 2023-08-21 ENCOUNTER — Other Ambulatory Visit (HOSPITAL_BASED_OUTPATIENT_CLINIC_OR_DEPARTMENT_OTHER): Payer: Self-pay

## 2023-08-21 ENCOUNTER — Other Ambulatory Visit: Payer: Self-pay

## 2023-08-21 ENCOUNTER — Inpatient Hospital Stay (HOSPITAL_BASED_OUTPATIENT_CLINIC_OR_DEPARTMENT_OTHER): Payer: Medicaid Other | Admitting: Oncology

## 2023-08-21 ENCOUNTER — Ambulatory Visit: Payer: Medicaid Other | Admitting: Dietician

## 2023-08-21 ENCOUNTER — Inpatient Hospital Stay: Payer: Medicaid Other

## 2023-08-21 ENCOUNTER — Encounter: Payer: Self-pay | Admitting: Oncology

## 2023-08-21 ENCOUNTER — Ambulatory Visit: Payer: Medicaid Other | Admitting: Oncology

## 2023-08-21 ENCOUNTER — Other Ambulatory Visit (HOSPITAL_COMMUNITY): Payer: Self-pay

## 2023-08-21 ENCOUNTER — Other Ambulatory Visit: Payer: Self-pay | Admitting: Nurse Practitioner

## 2023-08-21 ENCOUNTER — Encounter: Payer: Medicaid Other | Admitting: Dietician

## 2023-08-21 ENCOUNTER — Other Ambulatory Visit: Payer: Self-pay | Admitting: Oncology

## 2023-08-21 ENCOUNTER — Encounter (HOSPITAL_COMMUNITY): Admission: RE | Admit: 2023-08-21 | Payer: Medicaid Other | Source: Ambulatory Visit

## 2023-08-21 ENCOUNTER — Other Ambulatory Visit: Payer: Medicaid Other

## 2023-08-21 ENCOUNTER — Ambulatory Visit: Payer: Medicaid Other

## 2023-08-21 VITALS — BP 118/77 | HR 74 | Temp 98.0°F | Resp 16 | Ht 66.0 in | Wt 172.6 lb

## 2023-08-21 DIAGNOSIS — C160 Malignant neoplasm of cardia: Secondary | ICD-10-CM

## 2023-08-21 DIAGNOSIS — D500 Iron deficiency anemia secondary to blood loss (chronic): Secondary | ICD-10-CM | POA: Diagnosis not present

## 2023-08-21 DIAGNOSIS — Z5111 Encounter for antineoplastic chemotherapy: Secondary | ICD-10-CM

## 2023-08-21 DIAGNOSIS — Z95828 Presence of other vascular implants and grafts: Secondary | ICD-10-CM

## 2023-08-21 DIAGNOSIS — Z515 Encounter for palliative care: Secondary | ICD-10-CM

## 2023-08-21 DIAGNOSIS — G893 Neoplasm related pain (acute) (chronic): Secondary | ICD-10-CM

## 2023-08-21 DIAGNOSIS — R600 Localized edema: Secondary | ICD-10-CM

## 2023-08-21 LAB — CMP (CANCER CENTER ONLY)
ALT: 7 U/L (ref 0–44)
AST: 12 U/L — ABNORMAL LOW (ref 15–41)
Albumin: 2.9 g/dL — ABNORMAL LOW (ref 3.5–5.0)
Alkaline Phosphatase: 80 U/L (ref 38–126)
Anion gap: 5 (ref 5–15)
BUN: 7 mg/dL (ref 6–20)
CO2: 29 mmol/L (ref 22–32)
Calcium: 8.6 mg/dL — ABNORMAL LOW (ref 8.9–10.3)
Chloride: 103 mmol/L (ref 98–111)
Creatinine: 0.65 mg/dL (ref 0.61–1.24)
GFR, Estimated: >60 mL/min (ref >60)
Glucose, Bld: 104 mg/dL — ABNORMAL HIGH (ref 70–99)
Potassium: 3.7 mmol/L (ref 3.5–5.1)
Sodium: 137 mmol/L (ref 135–145)
Total Bilirubin: 0.5 mg/dL (ref 0.0–1.2)
Total Protein: 5.5 g/dL — ABNORMAL LOW (ref 6.5–8.1)

## 2023-08-21 LAB — CBC WITH DIFFERENTIAL (CANCER CENTER ONLY)
Abs Immature Granulocytes: 0.01 10*3/uL (ref 0.00–0.07)
Basophils Absolute: 0 10*3/uL (ref 0.0–0.1)
Basophils Relative: 1 %
Eosinophils Absolute: 0.2 10*3/uL (ref 0.0–0.5)
Eosinophils Relative: 4 %
HCT: 30.4 % — ABNORMAL LOW (ref 39.0–52.0)
Hemoglobin: 9.3 g/dL — ABNORMAL LOW (ref 13.0–17.0)
Immature Granulocytes: 0 %
Lymphocytes Relative: 35 %
Lymphs Abs: 1.5 10*3/uL (ref 0.7–4.0)
MCH: 23.7 pg — ABNORMAL LOW (ref 26.0–34.0)
MCHC: 30.6 g/dL (ref 30.0–36.0)
MCV: 77.4 fL — ABNORMAL LOW (ref 80.0–100.0)
Monocytes Absolute: 0.6 10*3/uL (ref 0.1–1.0)
Monocytes Relative: 15 %
Neutro Abs: 1.9 10*3/uL (ref 1.7–7.7)
Neutrophils Relative %: 45 %
Platelet Count: 211 10*3/uL (ref 150–400)
RBC: 3.93 MIL/uL — ABNORMAL LOW (ref 4.22–5.81)
RDW: 19 % — ABNORMAL HIGH (ref 11.5–15.5)
WBC Count: 4.2 10*3/uL (ref 4.0–10.5)
nRBC: 0 % (ref 0.0–0.2)

## 2023-08-21 LAB — TSH: TSH: 0.622 u[IU]/mL (ref 0.350–4.500)

## 2023-08-21 MED ORDER — TRAZODONE HCL 50 MG PO TABS
50.0000 mg | ORAL_TABLET | Freq: Every day | ORAL | 0 refills | Status: DC
  Filled 2023-08-23: qty 15, 15d supply, fill #0

## 2023-08-21 MED ORDER — DEXAMETHASONE SODIUM PHOSPHATE 10 MG/ML IJ SOLN
10.0000 mg | Freq: Once | INTRAMUSCULAR | Status: AC
  Administered 2023-08-21: 10 mg via INTRAVENOUS
  Filled 2023-08-21: qty 1

## 2023-08-21 MED ORDER — ONDANSETRON HCL 8 MG PO TABS
8.0000 mg | ORAL_TABLET | Freq: Three times a day (TID) | ORAL | 1 refills | Status: DC | PRN
  Filled 2023-08-21: qty 30, 10d supply, fill #0

## 2023-08-21 MED ORDER — DEXTROSE 5 % IV SOLN
400.0000 mg/m2 | Freq: Once | INTRAVENOUS | Status: AC
  Administered 2023-08-21: 768 mg via INTRAVENOUS
  Filled 2023-08-21: qty 17.5

## 2023-08-21 MED ORDER — DEXTROSE 5 % IV SOLN: INTRAVENOUS | Status: DC

## 2023-08-21 MED ORDER — PROCHLORPERAZINE MALEATE 10 MG PO TABS
10.0000 mg | ORAL_TABLET | Freq: Four times a day (QID) | ORAL | 1 refills | Status: DC | PRN
Start: 2023-08-21 — End: 2023-10-02
  Filled 2023-08-21: qty 30, 8d supply, fill #0
  Filled 2023-09-01 – 2023-09-04 (×3): qty 30, 8d supply, fill #1

## 2023-08-21 MED ORDER — METOCLOPRAMIDE HCL 10 MG PO TABS
10.0000 mg | ORAL_TABLET | Freq: Three times a day (TID) | ORAL | 3 refills | Status: DC
  Filled 2023-08-23: qty 120, 30d supply, fill #0
  Filled 2023-09-01 – 2023-10-02 (×2): qty 120, 30d supply, fill #1
  Filled 2023-10-30: qty 120, 30d supply, fill #2
  Filled 2024-01-02: qty 120, 30d supply, fill #3

## 2023-08-21 MED ORDER — SODIUM CHLORIDE 0.9% FLUSH: 10.0000 mL | INTRAVENOUS | Status: DC | PRN

## 2023-08-21 MED ORDER — SODIUM CHLORIDE 0.9 % IV SOLN
2400.0000 mg/m2 | INTRAVENOUS | Status: DC
  Administered 2023-08-21: 5000 mg via INTRAVENOUS
  Filled 2023-08-21: qty 100

## 2023-08-21 MED ORDER — HYDROMORPHONE HCL 2 MG PO TABS
2.0000 mg | ORAL_TABLET | ORAL | 0 refills | Status: DC | PRN
  Filled 2023-08-21 (×3): qty 90, 12d supply, fill #0

## 2023-08-21 MED ORDER — NIVOLUMAB CHEMO INJECTION 100 MG/10ML
240.0000 mg | Freq: Once | INTRAVENOUS | Status: AC
  Administered 2023-08-21: 240 mg via INTRAVENOUS
  Filled 2023-08-21: qty 24

## 2023-08-21 MED ORDER — HEPARIN SOD (PORK) LOCK FLUSH 100 UNIT/ML IV SOLN
500.0000 [IU] | Freq: Once | INTRAVENOUS | Status: DC | PRN
Start: 2023-08-21 — End: 2023-08-21

## 2023-08-21 MED ORDER — POTASSIUM CHLORIDE 20 MEQ PO PACK
20.0000 meq | PACK | Freq: Every day | ORAL | 3 refills | Status: DC
  Filled 2023-08-23: qty 30, 30d supply, fill #0
  Filled 2023-09-01 – 2023-10-02 (×2): qty 30, 30d supply, fill #1

## 2023-08-21 MED ORDER — SUCRALFATE 1 GM/10ML PO SUSP
1.0000 g | Freq: Three times a day (TID) | ORAL | 2 refills | Status: DC
  Filled 2023-08-21: qty 946, 24d supply, fill #0
  Filled 2023-09-01 – 2023-09-12 (×2): qty 946, 24d supply, fill #1
  Filled 2023-10-02: qty 946, 24d supply, fill #2
  Filled 2023-10-30: qty 762, 19d supply, fill #3

## 2023-08-21 MED ORDER — OXALIPLATIN CHEMO INJECTION 100 MG/20ML
68.0000 mg/m2 | Freq: Once | INTRAVENOUS | Status: AC
  Administered 2023-08-21: 130 mg via INTRAVENOUS
  Filled 2023-08-21: qty 6

## 2023-08-21 MED ORDER — PALONOSETRON HCL INJECTION 0.25 MG/5ML
0.2500 mg | Freq: Once | INTRAVENOUS | Status: AC
  Administered 2023-08-21: 0.25 mg via INTRAVENOUS
  Filled 2023-08-21: qty 5

## 2023-08-21 MED ORDER — SODIUM CHLORIDE 0.9% FLUSH
10.0000 mL | Freq: Once | INTRAVENOUS | Status: AC
  Administered 2023-08-21: 10 mL

## 2023-08-21 NOTE — Progress Notes (Signed)
 [facility]  ONCOLOGY CLINIC PROGRESS NOTE   Patient Care Team: Ivonne Andrew, NP as PCP - General (Pulmonary Disease) Clemons, Edmund Hilda Care Management Cherita Hebel, Archie Patten, MD as Consulting Physician (Oncology) Vida Rigger, MD as Consulting Physician (Gastroenterology) Pickenpack-Cousar, Arty Baumgartner, NP as Nurse Practitioner [facility] and Palliative Medicine)  PATIENT NAME: Jesus Haynes   MR#: [id number] DOB: 11-09-1975  Date of visit: 08/21/2023   ASSESSMENT & PLAN:   TOBECHUKWU EMMICK is a 48 y.o. pleasant gentleman with history of asthma, presented to the ED on 05/27/2023 with complaints of epigastric burning abdominal pain, nausea, retching.  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  Adenocarcinoma of gastroesophageal junction (HCC) -Please review HPI/oncology history for additional details and timeline of events.  -Reviewed staging PET/CT findings with the patient previously.  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  Discussed treatment options. All treatment options are palliative in nature and not curative intent. Plan made to proceed with palliative systemic treatment using FOLFOX plus nivolumab.   -He started palliative systemic treatments with FOLFOX plus nivolumab from 07/23/2023.  He was admitted to the hospital on 07/30/2023 after he presented with chest and epigastric pain, episodes of melena. He was found to have hemoglobin of 6.8. He was admitted for blood transfusion and further evaluation.  GI deferred additional workup/EGD since the cause of blood loss seemed obvious from malignancy.  -He was discharged home on 08/02/2023.  His performance status is slowly improving.  He is able to eat better.  Not reliant  on tube feeds as much now.  He is walking and taking stairs.  -He received cycle 2 of FOLFOX plus nivolumab on 08/07/2023 without 5-FU bolus and a 20% dose reduction of oxaliplatin and tolerated the chemotherapy and immunotherapy reasonably well.  Did not experience major side effects.  -His hemoglobin is stable at 9.3 today. His WBC count is 4,200 with normal differential.  Platelet count 211,000.  He is due for cycle 3 of chemoimmunotherapy today.  He will continue to receive oxaliplatin at 68 mg/m, 20% dose reduction.  We will hold 5-FU bolus today.  Will consider resuming 5-FU bolus depending on symptoms and blood counts. We will adjust dosage and frequency if needed.  RTC in 2 weeks for cycle 4 of chemoimmunotherapy.  We will attempt to increase oxaliplatin dose to 85 mg/m, if no dose-limiting toxicities are noted.  He was advised to call us in the interim if any questions or concerns or concerning symptoms.  Will obtain restaging PET scan in mid March 2025.  Cancer associated pain - Continue aggressive pain management.  He is currently on Dilaudid and methadone.  We greatly appreciate palliative care team's assistance in his management.  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  Currently hemoglobin is stable at 9.3.  Pedal edema Persistent lower extremity edema despite Lasix therapy. Low albumin levels contributing to fluid retention. Utilizing compression socks, massage devices, leg elevation, and increased protein intake. Discussed risks of dehydration and renal damage with increased Lasix dosage. - Continue Lasix at 20 mg daily as needed - Use compression socks - Utilize massage and compression devices - Monitor albumin levels - Increase protein intake  I reviewed lab results and outside records for this visit and discussed relevant results with the patient. Diagnosis, plan of care  and treatment options were also discussed in detail with the patient. Opportunity provided to ask questions and answers provided to his apparent satisfaction. Provided instructions to call our clinic with any problems, questions or concerns prior to return visit. I recommended to continue follow-up with PCP and sub-specialists. He verbalized understanding and agreed with the plan.   NCCN guidelines have been consulted in the planning of this patient's care.  I spent a total of 30 minutes during this encounter with the patient including review of chart and various tests results, discussions about plan of care and coordination of care plan.   Meryl Crutch, MD  08/21/2023 12:48 PM  [facility] - A DEPT 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  INTERVAL HISTORY:    Discussed the use of AI scribe software for clinical note transcription with the patient, who gave verbal consent to proceed.   KAIMEN PEINE is here today for repeat clinical assessment.    He was contemplating on going on hospice 2 weeks ago because of persistent abdominal pain.  He has changed his mind now.  He reports ongoing swelling in the legs. Despite being on Lasix, the swelling has not reduced. The patient's caregiver expresses concern about the effectiveness of Lasix, but acknowledges that the swelling could potentially be worse without it.  In addition to the swelling, the patient has been experiencing bad nightmares due to mirtazapine, a medication prescribed for sleep. As a result, the patient has stopped taking mirtazapine and is seeking an  alternative.  The patient has a J tube for feeding but has been eating orally. The J tube fell out recently but was replaced. Despite having the J tube, the patient has been eating well and has gained two pounds. The patient's caregiver mentions that the patient is not keen on using the J tube for feeding and questions the need for it since the patient is eating orally.  The patient's Dilaudid prescription is due for a refill in two days. The patient's caregiver has sent a message to the nurse practitioner about this.  The patient's caregiver also mentions that the patient has an upcoming appointment with a radiation oncologist to discuss potential radiation therapy for pain management.  I have reviewed the past medical history, past surgical history, social history and family history with the patient and they are unchanged from previous note.  HISTORY OF PRESENT ILLNESS:   Oncology History  Adenocarcinoma of gastroesophageal junction (HCC)  05/29/2023 Initial Diagnosis   Adenocarcinoma of gastroesophageal junction (HCC)   07/18/2023 Cancer Staging   Staging form: Esophagus - Adenocarcinoma, AJCC 8th Edition - Clinical: Stage IVB (cT4, cN3, cM1, G3) - Signed by Meryl Crutch, MD on 07/18/2023 Histologic grading system: 3 grade system   07/23/2023 -  Chemotherapy   Patient is on Treatment Plan : GASTROESOPHAGEAL FOLFOX + Nivolumab q14d       48 y.o. gentleman with history of asthma, presented to the ED on 05/27/2023 with complaints of epigastric burning abdominal pain, nausea, retching.  He also reported  some dark tarry stools for 3 days prior to arrival.  He was previously seen in the ED at [facility] on 05/08/2023 with complaints of chest or right upper quadrant abdominal pain.  Workup was unremarkable at that time and he was sent home with Protonix and Carafate.  Since his symptoms persisted, he presented to the ED again.   In the ED, his hemoglobin was noted to be down to 10, compared to  13 previously.  With concern for upper GI bleed, was admitted for further evaluation and management.   Dr. Ewing Schlein performed upper GI endoscopy on 05/28/2023.  It showed partially obstructing, likely malignant esophageal tumor in the lower third of the esophagus.  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  Normal duodenum.  Pathology from GE junction mass came back positive for invasive adenocarcinoma, moderately to poorly differentiated.  Immunostains pending.   CT chest abdomen pelvis on 05/28/2023 showed abnormal wall thickening at the GE junction, compatible with patient's known carcinoma.  Prominent lymph nodes in the retrocrural region on the right side, adjacent to the hiatal hernia.  Abnormal low-density soft tissue in the gastrohepatic region, abutting the lesser curvature and proximal stomach, worrisome for metastatic disease.  Periportal, periceliac, gastrosplenic and left retroperitoneal lymphadenopathy, worrisome for metastatic involvement.  Hypodensities in the liver were also noted, worrisome for metastatic disease, largest 1 measuring 13 mm in the inferior right lobe.  Fat stranding surrounding the body and tail of pancreas, suggesting acute pancreatitis.   We were consulted during his hospitalization on 05/29/2023 for additional recommendations given new diagnosis of GE junction adenocarcinoma.   On 06/07/2023, staging PET/CT showed large hypermetabolic mass involving the distal esophagus and proximal stomach consistent with known primary esophageal carcinoma.  Multiple hypermetabolic retroperitoneal, lower mediastinal lymph nodes consistent with locally advanced disease.  Suspected direct tumor extension into the pancreatic body.  Hypermetabolic node or peritoneal implant within the omentum.  No other evidence of metastatic disease.   Plan was for palliative systemic treatments with FOLFOX plus nivolumab.  However patient's performance status continued to decline and he had to be  hospitalized again on 06/14/2023.  He finally presented to reestablish care in our clinic on 07/18/2023.  His performance status improved with nutrition via J-tube.  Started systemic treatments from 07/23/2023.  We did dose reduce oxaliplatin by 20% and proceeded with 68 mg/m dose.  When he presented for cycle 2 on 08/07/2023, leukopenia with white count of 3300, ANC 1100.  We started skipping 5-FU bolus and continue with the dose reduced oxaliplatin.    REVIEW OF SYSTEMS:   Review of Systems - Oncology  All other pertinent systems were reviewed with the patient and are negative.  ALLERGIES: He has no known allergies.  MEDICATIONS:  Current Outpatient Medications  Medication Sig Dispense Refill   albuterol (PROVENTIL) (2.5 MG/3ML) 0.083% nebulizer solution Take 3 mLs (2.5 mg total) by nebulization every 6 (six) hours as needed for wheezing or shortness of breath. 75 mL 12   albuterol (VENTOLIN HFA) 108 (90 Base) MCG/ACT inhaler Inhale 1-2 puffs into the lungs every 4 (four) hours as needed for wheezing or shortness of breath. 18 g 3   cyanocobalamin 1000 MCG tablet Take 1 tablet (1,000 mcg total) by mouth daily. 30 tablet 2   folic acid (FOLVITE) 1 MG tablet Take 1 tablet (1 mg total) by mouth daily. 30 tablet 2   furosemide (LASIX) 20 MG tablet Take 1 tablet (20 mg total) by mouth daily as needed for  fluid or edema. 30 tablet 3   HYDROmorphone (DILAUDID) 2 MG tablet Take 1 tablet (2 mg total) by mouth every 3 (three) hours as needed for severe pain (pain score 7-10). 90 tablet 0   lidocaine-prilocaine (EMLA) cream Apply to affected area once 30 g 3   methadone (DOLOPHINE) 5 MG tablet Take 1 tablet (5 mg total) by mouth every 12 (twelve) hours. 60 tablet 0   Nutritional Supplements (FEEDING SUPPLEMENT, OSMOLITE 1.5 CAL,) LIQD At 60 mL/h over 18 hours on a daily basis. 30000 mL 0   pantoprazole (PROTONIX) 40 MG tablet Take 1 tablet (40 mg total) by mouth 2 (two) times daily. 60 tablet 1    polyethylene glycol powder (GLYCOLAX/MIRALAX) 17 GM/SCOOP powder Dissolve 17 g in 4 oz of liquid and take by mouth daily. 476 g 0   senna-docusate (SENOKOT-S) 8.6-50 MG tablet Take 2 tablets by mouth at bedtime. 60 tablet 2   sucralfate (CARAFATE) 1 GM/10ML suspension Take 10 mLs (1 g total) by mouth 4 (four) times daily -  with meals and at bedtime. 420 mL 2   Water For Irrigation, Sterile (FREE WATER) SOLN Place 100 mLs into feeding tube 4 (four) times daily. 12000 mL 1   metoCLOPramide (REGLAN) 10 MG tablet Take 1 tablet (10 mg total) by mouth 4 (four) times daily -  before meals and at bedtime. 120 tablet 3   ondansetron (ZOFRAN) 8 MG tablet Take 1 tablet (8 mg total) by mouth every 8 (eight) hours as needed for nausea or vomiting. Start on the third day after chemotherapy. 30 tablet 1   potassium chloride (KLOR-CON) 20 MEQ packet Take 20 mEq by mouth daily. 30 packet 3   prochlorperazine (COMPAZINE) 10 MG tablet Take 1 tablet (10 mg total) by mouth every 6 (six) hours as needed for nausea or vomiting. 30 tablet 1   traZODone (DESYREL) 50 MG tablet Take 1 tablet (50 mg total) by mouth at bedtime. 15 tablet 0   No current facility-administered medications for this visit.   Facility-Administered Medications Ordered in Other Visits  Medication Dose Route Frequency Provider Last Rate Last Admin   dextrose 5 % solution   Intravenous Continuous Arizbeth Cawthorn, MD 10 mL/hr at 08/21/23 1041 New Bag at 08/21/23 1041   fluorouracil (ADRUCIL) 5,000 mg in sodium chloride 0.9 % 150 mL chemo infusion  2,400 mg/m2 (Treatment Plan Recorded) Intravenous 1 day or 1 dose Ettore Trebilcock, MD       heparin lock flush 100 unit/mL  500 Units Intracatheter Once PRN Donnabelle Blanchard, MD       leucovorin 768 mg in dextrose 5 % 250 mL infusion  400 mg/m2 (Treatment Plan Recorded) Intravenous Once Lael Wetherbee, MD 144 mL/hr at 08/21/23 1208 768 mg at 08/21/23 1208   oxaliplatin (ELOXATIN) 130 mg in dextrose 5 % 500 mL  chemo infusion  68 mg/m2 (Treatment Plan Recorded) Intravenous Once Sierrah Luevano, MD 263 mL/hr at 08/21/23 1213 130 mg at 08/21/23 1213   sodium chloride flush (NS) 0.9 % injection 10 mL  10 mL Intracatheter PRN Sylvi Rybolt, MD         VITALS:   Blood pressure 118/77, pulse 74, temperature 98 F (36.7 C), temperature source Temporal, resp. rate 16, height 5\' 6"  (1.676 m), weight 172 lb 9.6 oz (78.3 kg), SpO2 97%.  Wt Readings from Last 3 Encounters:  08/21/23 172 lb 9.6 oz (78.3 kg)  08/15/23 170 lb 4.8 oz (77.2 kg)  08/07/23 168 lb 8  oz (76.4 kg)    Body mass index is 27.86 kg/m.   Onc Performance Status - 08/21/23 0935       ECOG Perf Status   ECOG Perf Status Ambulatory and capable of all selfcare but unable to carry out any work activities.  Up and about more than 50% of waking hours      KPS SCALE   KPS % SCORE Cares for self, unable to carry on normal activity or to do active work               PHYSICAL EXAM:   Physical Exam Constitutional:      General: He is not in acute distress.    Appearance: Normal appearance.  HENT:     Head: Normocephalic and atraumatic.  Eyes:     General: No scleral icterus.    Conjunctiva/sclera: Conjunctivae normal.  Cardiovascular:     Rate and Rhythm: Normal rate and regular rhythm.     Heart sounds: Normal heart sounds.  Pulmonary:     Effort: Pulmonary effort is normal.     Breath sounds: Normal breath sounds.  Chest:     Comments: Port-A-Cath in place without signs of infection Abdominal:     General: There is no distension.     Comments: J-tube in place  Musculoskeletal:     Right lower leg: Edema present.     Left lower leg: Edema present.  Lymphadenopathy:     Cervical: No cervical adenopathy.  Neurological:     General: No focal deficit present.     Mental Status: He is oriented to person, place, and time.  Psychiatric:        Mood and Affect: Mood normal.        Behavior: Behavior normal.       LABORATORY DATA:   I have reviewed the data as listed.  Results for orders placed or performed in visit on 08/21/23  CBC with Differential (Cancer Center Only)  Result Value Ref Range   WBC Count 4.2 4.0 - 10.5 K/uL   RBC 3.93 (L) 4.22 - 5.81 MIL/uL   Hemoglobin 9.3 (L) 13.0 - 17.0 g/dL   HCT 40.9 (L) 81.1 - 91.4 %   MCV 77.4 (L) 80.0 - 100.0 fL   MCH 23.7 (L) 26.0 - 34.0 pg   MCHC 30.6 30.0 - 36.0 g/dL   RDW 78.2 (H) 95.6 - 21.3 %   Platelet Count 211 150 - 400 K/uL   nRBC 0.0 0.0 - 0.2 %   Neutrophils Relative % 45 %   Neutro Abs 1.9 1.7 - 7.7 K/uL   Lymphocytes Relative 35 %   Lymphs Abs 1.5 0.7 - 4.0 K/uL   Monocytes Relative 15 %   Monocytes Absolute 0.6 0.1 - 1.0 K/uL   Eosinophils Relative 4 %   Eosinophils Absolute 0.2 0.0 - 0.5 K/uL   Basophils Relative 1 %   Basophils Absolute 0.0 0.0 - 0.1 K/uL   Immature Granulocytes 0 %   Abs Immature Granulocytes 0.01 0.00 - 0.07 K/uL  CMP (Cancer Center only)  Result Value Ref Range   Sodium 137 135 - 145 mmol/L   Potassium 3.7 3.5 - 5.1 mmol/L   Chloride 103 98 - 111 mmol/L   CO2 29 22 - 32 mmol/L   Glucose, Bld 104 (H) 70 - 99 mg/dL   BUN 7 6 - 20 mg/dL   Creatinine 0.86 5.78 - 1.24 mg/dL   Calcium 8.6 (L) 8.9 - 10.3 mg/dL  Total Protein 5.5 (L) 6.5 - 8.1 g/dL   Albumin 2.9 (L) 3.5 - 5.0 g/dL   AST 12 (L) 15 - 41 U/L   ALT 7 0 - 44 U/L   Alkaline Phosphatase 80 38 - 126 U/L   Total Bilirubin 0.5 0.0 - 1.2 mg/dL   GFR, Estimated >16 >10 mL/min   Anion gap 5 5 - 15        RADIOGRAPHIC STUDIES:  I have personally reviewed the radiological images as listed and agree with the findings in the report.  DG Chest 2 View Result Date: 07/30/2023 CLINICAL DATA:  Chest pain.  Lung cancer. EXAM: CHEST - 2 VIEW COMPARISON:  Chest radiograph dated 05/08/2023. FINDINGS: Right-sided Port-A-Cath with tip at the cavoatrial junction. Small bilateral pleural effusions and bibasilar atelectasis. No consolidative changes. No  pneumothorax. The cardiac silhouette is within normal limits. No acute osseous pathology. IMPRESSION: Small bilateral pleural effusions and bibasilar atelectasis. Electronically Signed   By: Elgie Collard M.D.   On: 07/30/2023 10:26    CODE STATUS:  Code Status History     Date Active Date Inactive Code Status Order ID Comments User Context   06/14/2023 1621 07/11/2023 2225 Full Code [id number]  Bobette Mo, MD Inpatient   06/13/2023 1253 06/14/2023 0509 Full Code [id number]  Richarda Overlie, MD [facility]   05/27/2023 1516 05/30/2023 1651 Full Code [id number]  Maryln Gottron, MD ED    Questions for Most Recent Historical Code Status (Order [id number])     Question Answer   By: Consent: discussion documented in EHR            No orders of the defined types were placed in this encounter.    Future Appointments  Date Time Provider Department Center  08/23/2023  2:00 PM [facility] FLUSH [facility] None  08/28/2023  1:40 PM Ivonne Andrew, NP [facility] None  08/29/2023 12:30 PM [facility] NURSE [facility] None  08/29/2023  1:00 PM Dorothy Puffer, MD [facility] None  09/03/2023 10:00 AM [facility] FLUSH [facility] None  09/03/2023 10:30 AM Pollyann Samples, NP [facility] None  09/03/2023 11:00 AM [facility] PALLIATIVE CARE [facility] None  09/04/2023 10:00 AM [facility] INFUSION [facility] None  09/06/2023  1:15 PM [facility] FLUSH [facility] None  09/18/2023 11:00 AM [facility] FLUSH [facility] None  09/18/2023 11:30 AM Victorino Fatzinger, MD [facility] None  09/18/2023 12:30 PM [facility] INFUSION [facility] None  09/20/2023  3:00 PM [facility] FLUSH [facility] None  09/20/2023  3:30 PM [facility] PET CT 1 [facility] [place]  10/02/2023 10:45 AM [facility] MED[facility] FLUSH [facility] None  10/02/2023 11:30 AM Abdurrahman Petersheim, MD [facility] None  10/02/2023 12:30 PM [facility] INFUSION [facility] None  10/04/2023  2:30 PM [facility] FLUSH [facility] None     This document was completed utilizing  speech recognition software. Grammatical errors, random word insertions, pronoun errors, and incomplete sentences are an occasional consequence of this system due to software limitations, ambient noise, and hardware issues. Any formal questions or concerns about the content, text or information contained within the body of this dictation should be directly addressed to the provider for clarification.

## 2023-08-21 NOTE — Assessment & Plan Note (Signed)
 Persistent lower extremity edema despite Lasix therapy. Low albumin levels contributing to fluid retention. Utilizing compression socks, massage devices, leg elevation, and increased protein intake. Discussed risks of dehydration and renal damage with increased Lasix dosage. - Continue Lasix at 20 mg daily as needed - Use compression socks - Utilize massage and compression devices - Monitor albumin levels - Increase protein intake

## 2023-08-21 NOTE — Assessment & Plan Note (Addendum)
-  Continue to monitor CBCD with each visit and transfuse as needed to maintain hemoglobin closer to 8.  Currently hemoglobin is stable at 9.3.

## 2023-08-21 NOTE — Patient Instructions (Signed)
 [facility] - A DEPT OF [facility]  Discharge Instructions: Thank you for choosing [facility] to provide your oncology and hematology care.   If you have a lab appointment with the Cancer Center, please go directly to the Cancer Center and check in at the registration area.   Wear comfortable clothing and clothing appropriate for easy access to any Portacath or PICC line.   We strive to give you quality time with your provider. You may need to reschedule your appointment if you arrive late (15 or more minutes).  Arriving late affects you and other patients whose appointments are after yours.  Also, if you miss three or more appointments without notifying the office, you may be dismissed from the clinic at the provider's discretion.      For prescription refill requests, have your pharmacy contact our office and allow 72 hours for refills to be completed.    Today you received the following chemotherapy and/or immunotherapy agents:  Opdivo, Oxaliplatin, Leucovorin, Fluorouracil      To help prevent nausea and vomiting after your treatment, we encourage you to take your nausea medication as directed.  BELOW ARE SYMPTOMS THAT SHOULD BE REPORTED IMMEDIATELY: *FEVER GREATER THAN 100.4 F (38 C) OR HIGHER *CHILLS OR SWEATING *NAUSEA AND VOMITING THAT IS NOT CONTROLLED WITH YOUR NAUSEA MEDICATION *UNUSUAL SHORTNESS OF BREATH *UNUSUAL BRUISING OR BLEEDING *URINARY PROBLEMS (pain or burning when urinating, or frequent urination) *BOWEL PROBLEMS (unusual diarrhea, constipation, pain near the anus) TENDERNESS IN MOUTH AND THROAT WITH OR WITHOUT PRESENCE OF ULCERS (sore throat, sores in mouth, or a toothache) UNUSUAL RASH, SWELLING OR PAIN  UNUSUAL VAGINAL DISCHARGE OR ITCHING   Items with * indicate a potential emergency and should be followed up as soon as possible or go to the Emergency Department if any problems should occur.  Please show the  CHEMOTHERAPY ALERT CARD or IMMUNOTHERAPY ALERT CARD at check-in to the Emergency Department and triage nurse.  Should you have questions after your visit or need to cancel or reschedule your appointment, please contact [facility]  Dept: [phone]  and follow the prompts.  Office hours are 8:00 a.m. to 4:30 p.m. Monday - Friday. Please note that voicemails left after 4:00 p.m. may not be returned until the following business day.  We are closed weekends and major holidays. You have access to a nurse at all times for urgent questions. Please call the main number to the clinic Dept: [phone] and follow the prompts.   For any non-urgent questions, you may also contact your provider using MyChart. We now offer e-Visits for anyone 76 and older to request care online for non-urgent symptoms. For details visit [email or web address].   Also download the MyChart app! Go to the app store, search "MyChart", open the app, select [place], and log in with your MyChart username and password.

## 2023-08-21 NOTE — Assessment & Plan Note (Addendum)
-  Please review HPI/oncology history for additional details and timeline of events.  -Reviewed staging PET/CT findings with the patient previously.  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  Discussed treatment options. All treatment options are palliative in nature and not curative intent. Plan made to proceed with palliative systemic treatment using FOLFOX plus nivolumab.   -He started palliative systemic treatments with FOLFOX plus nivolumab from 07/23/2023.  He was admitted to the hospital on 07/30/2023 after he presented with chest and epigastric pain, episodes of melena. He was found to have hemoglobin of 6.8. He was admitted for blood transfusion and further evaluation.  GI deferred additional workup/EGD since the cause of blood loss seemed obvious from malignancy.  -He was discharged home on 08/02/2023.  His performance status is slowly improving.  He is able to eat better.  Not reliant on tube feeds as much now.  He is walking and taking stairs.  -He received cycle 2 of FOLFOX plus nivolumab on 08/07/2023 without 5-FU bolus and a 20% dose reduction of oxaliplatin and tolerated the chemotherapy and immunotherapy reasonably well.  Did not experience major side effects.  -His hemoglobin is stable at 9.3 today. His WBC count is 4,200 with normal differential.  Platelet count 211,000.  He is due for cycle 3 of chemoimmunotherapy today.  He will continue to receive oxaliplatin at 68 mg/m, 20% dose reduction.  We will hold 5-FU bolus today.  Will consider resuming 5-FU bolus depending on symptoms and blood counts. We will adjust dosage and frequency if needed.  RTC in 2 weeks for cycle 4 of chemoimmunotherapy.  We will attempt to increase oxaliplatin dose to 85 mg/m, if no dose-limiting toxicities are noted.  He was advised to call us in the interim if any questions or concerns or concerning symptoms.  Will  obtain restaging PET scan in mid March 2025.

## 2023-08-21 NOTE — Progress Notes (Signed)
 Nutrition Follow-up:  Patient with metastatic adenocarcinoma of GE junction. He is receiving palliative Folfox + Nivolumab q14d. Patient is under the care of Dr. Arlana Pouch.   1/28-1/31 hospital admit - UGI; acute/chronic blood loss anemia  12/13-1/9 hospital admit - FTT -S/p open Jtube 12/17 (DME: [facility])  Met with patient in infusion. No family present at this time. Patient awakens easily with name call, however continues to nod off during visit. Limited history obtained today. Patient reports appetite has been pretty good. He is eating more by mouth and tolerating regular diet. Patient said he tried a few of the shake recipes. Patient reports using Jtube for nutrition sometimes, but not much. He continues to use tube for medications. He is providing daily FWF.    Medications: reglan, klor-con, compazine, trazodone (new as of 2/19)  Labs: glucose 104, albumin 2.9  Anthropometrics: Wt 172 lb 9.6 oz today increased from 170 lb 4.8 oz on 2/13 and 168 lb 8 oz on 2/5   Estimated Energy Needs  Kcals: 2350-2750 Protein: 115-130 Fluid: >2.3 L  NUTRITION DIAGNOSIS: Inadequate oral intake improving    MALNUTRITION DIAGNOSIS: Moderate malnutrition continues, however improving   INTERVENTION:  Encourage high calorie high protein foods for wt maintenance/gain Recommend protein source at every meal/snack Encourage overnight feeds as tolerated (Osmolite 1.5 @ 60 ml/hr)    MONITORING, EVALUATION, GOAL: wt trends, intake   NEXT VISIT: Wednesday March 19 during infusion

## 2023-08-21 NOTE — Assessment & Plan Note (Signed)
 -  Continue aggressive pain management.  He is currently on Dilaudid and methadone.  We greatly appreciate palliative care team's assistance in his management.

## 2023-08-21 NOTE — Progress Notes (Signed)
 [facility] CSW Progress Note  Visual merchandiser met with patient and caregiver on this date. Patient's caregiver has finished process of applying for food stamps and was interested in assistance with disability. CSW sent over referral to Patient Film/video editor for [employer].   Marguerita Merles, LCSW Clinical Social Worker [facility]

## 2023-08-22 LAB — T4: T4, Total: 8.6 ug/dL (ref 4.5–12.0)

## 2023-08-23 ENCOUNTER — Inpatient Hospital Stay: Payer: Medicaid Other

## 2023-08-23 ENCOUNTER — Other Ambulatory Visit (HOSPITAL_COMMUNITY): Payer: Self-pay

## 2023-08-23 VITALS — BP 96/63 | HR 69 | Temp 98.6°F | Resp 18

## 2023-08-23 DIAGNOSIS — Z5111 Encounter for antineoplastic chemotherapy: Secondary | ICD-10-CM | POA: Diagnosis not present

## 2023-08-23 DIAGNOSIS — Z95828 Presence of other vascular implants and grafts: Secondary | ICD-10-CM

## 2023-08-23 MED ORDER — ALBUTEROL SULFATE 2.5 MG/0.5ML IN NEBU
INHALATION_SOLUTION | RESPIRATORY_TRACT | 3 refills | Status: DC
  Filled 2023-09-01: qty 30, 15d supply, fill #0
  Filled 2023-09-03: qty 30, 8d supply, fill #0
  Filled 2023-09-04: qty 30, 30d supply, fill #0
  Filled 2023-10-30: qty 30, fill #0
  Filled 2024-02-03: qty 30, 8d supply, fill #0

## 2023-08-23 MED ORDER — HEPARIN SOD (PORK) LOCK FLUSH 100 UNIT/ML IV SOLN
500.0000 [IU] | Freq: Once | INTRAVENOUS | Status: AC
  Administered 2023-08-23: 500 [IU]

## 2023-08-23 MED ORDER — FUROSEMIDE 20 MG PO TABS
20.0000 mg | ORAL_TABLET | Freq: Every day | ORAL | 0 refills | Status: DC
  Filled 2023-08-23 – 2023-09-12 (×3): qty 4, 4d supply, fill #0

## 2023-08-23 MED ORDER — ENSURE ENLIVE PO LIQD: ORAL | 12 refills | Status: DC

## 2023-08-23 MED ORDER — SODIUM CHLORIDE 0.9% FLUSH
10.0000 mL | Freq: Once | INTRAVENOUS | Status: AC
  Administered 2023-08-23: 10 mL

## 2023-08-23 NOTE — Progress Notes (Signed)
 [facility] CSW Progress Note  Clinical Child psychotherapist submitted patient's referral to the [facility] at the [facility].    Marguerita Merles, LCSW Clinical Social Worker [facility]

## 2023-08-26 ENCOUNTER — Other Ambulatory Visit (HOSPITAL_COMMUNITY): Payer: Self-pay

## 2023-08-27 ENCOUNTER — Other Ambulatory Visit (HOSPITAL_COMMUNITY): Payer: Self-pay

## 2023-08-28 ENCOUNTER — Ambulatory Visit: Payer: Self-pay | Admitting: Nurse Practitioner

## 2023-08-29 ENCOUNTER — Encounter: Payer: Self-pay | Admitting: Radiation Oncology

## 2023-08-29 ENCOUNTER — Ambulatory Visit
Admission: RE | Admit: 2023-08-29 | Discharge: 2023-08-29 | Disposition: A | Discharge status: Home / Self Care | Payer: Medicaid Other | Source: Ambulatory Visit | Attending: Radiation Oncology | Admitting: Radiation Oncology

## 2023-08-29 VITALS — BP 101/78 | HR 67 | Temp 97.5°F | Resp 22 | Ht 66.0 in | Wt 161.4 lb

## 2023-08-29 DIAGNOSIS — C160 Malignant neoplasm of cardia: Secondary | ICD-10-CM | POA: Diagnosis present

## 2023-08-29 DIAGNOSIS — Z87891 Personal history of nicotine dependence: Secondary | ICD-10-CM | POA: Diagnosis not present

## 2023-08-29 DIAGNOSIS — E440 Moderate protein-calorie malnutrition: Secondary | ICD-10-CM | POA: Diagnosis not present

## 2023-08-29 DIAGNOSIS — R627 Adult failure to thrive: Secondary | ICD-10-CM | POA: Insufficient documentation

## 2023-08-29 DIAGNOSIS — Z79899 Other long term (current) drug therapy: Secondary | ICD-10-CM | POA: Diagnosis not present

## 2023-08-29 DIAGNOSIS — R590 Localized enlarged lymph nodes: Secondary | ICD-10-CM | POA: Insufficient documentation

## 2023-08-29 DIAGNOSIS — R109 Unspecified abdominal pain: Secondary | ICD-10-CM | POA: Insufficient documentation

## 2023-08-29 DIAGNOSIS — K921 Melena: Secondary | ICD-10-CM | POA: Diagnosis not present

## 2023-08-29 DIAGNOSIS — R112 Nausea with vomiting, unspecified: Secondary | ICD-10-CM | POA: Diagnosis not present

## 2023-08-29 DIAGNOSIS — J45909 Unspecified asthma, uncomplicated: Secondary | ICD-10-CM | POA: Insufficient documentation

## 2023-08-29 NOTE — Progress Notes (Signed)
 Radiation Oncology         [phone] ________________________________  Name: [name]        MRN: [id number]  Date of Service: 08/29/2023 DOB: Nov 02, 1975  YQ:MVHQION, Gildardo Pounds, NP  Pasam, Archie Patten, MD     REFERRING PHYSICIAN: Meryl Crutch, MD   DIAGNOSIS: The encounter diagnosis was Adenocarcinoma of gastroesophageal junction (HCC).   HISTORY OF PRESENT ILLNESS: Jesus Haynes is a 48 y.o. male seen at the request of Dr. 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 The patient was originally diagnosed in November 2024 after a few ED visits for wheezing and ultimately abdominal pain, nausea, vomiting, and dark stools. He was admitted on 05/27/23 and found to have a hgb of 10.1, previously 13 earlier in the month. He underwent EGD on 05/27/23 which showed a large fungating and ulcerating mass in the lower third of the esophagus also overlapping the gastric cardia and portion of the fundus. Biopsies showed adenocarcinoma, and CT imaging of the CAP on 05/28/23 showed abnormal wall thickening of the GE junction, prominent nodes in the retrocrural region, soft tissue changes of the gastrohepatic region abutting the lesser curvature and proximal stomach concerning for metastatic disease. Periportal, paraceliac, gastrosplenic,a nd left retroperitoneal adenopathy, hypodensities in the liver, and changes along the body and tail of the pancreas was noted. An outpatient PET on 06/07/23 showed a large hypermetabolic mass involving the distal esophagus and proximal stomach consistent with known carcinoma with concerns for multiple hypermetabolic retroperitoneal and lower mediastinal adenopathy concerning for locally advanced disease with suspected direct tumor extension into the pancreatic body, and hypermetabolic node/peritoneal implant within the omentum.   He was readmitted with failure to thrive, pain and anemia  in December 2024 and ultimately a Jejunostomy tube was placed  for enteral feeding. He began systemic therapy with palliative chemoimmunotherapy with Dr. Arlana Pouch on 07/23/23. He again had progressive anemia with a hgb of 6.8 and heme positive stools. He received blood transfusion and was able to resume treatment on 08/21/23. He continues to have abdominal pain and is seen to consider palliative radiation.   PREVIOUS RADIATION THERAPY: No   PAST MEDICAL HISTORY:  Past Medical History:  Diagnosis Date   Asthma        PAST SURGICAL HISTORY: Past Surgical History:  Procedure Laterality Date   BIOPSY  05/28/2023   Procedure: BIOPSY;  Surgeon: Vida Rigger, MD;  Location: [facility];  Service: Gastroenterology;;   ESOPHAGOGASTRODUODENOSCOPY (EGD) WITH PROPOFOL N/A 05/28/2023   Procedure: ESOPHAGOGASTRODUODENOSCOPY (EGD) WITH PROPOFOL;  Surgeon: Vida Rigger, MD;  Location: [facility];  Service: Gastroenterology;  Laterality: N/A;   IR IMAGING GUIDED PORT INSERTION  06/13/2023   UMBILICAL HERNIA REPAIR N/A 06/18/2023   Procedure: PLACEMENT OF J TUBE;  Surgeon: Fritzi Mandes, MD;  Location: [facility];  Service: General;  Laterality: N/A;     FAMILY HISTORY:  Family History  Problem Relation Age of Onset   Heart failure Father      SOCIAL HISTORY:  reports that he quit smoking about 10 years ago. His smoking use included cigarettes. He has never used smokeless tobacco. He reports that he does not drink alcohol and does not use drugs. The patient is in a relationship with Jesus Haynes who accompanies him. He has 7 children, and was working for himself prior to his diagnosis.  They live in [place].    ALLERGIES: Patient has no known allergies.   MEDICATIONS:  Current Outpatient Medications  Medication Sig Dispense Refill   albuterol (PROVENTIL) (2.5 MG/3ML) 0.083% nebulizer solution Take 3 mLs (2.5 mg total) by nebulization every 6 (six) hours as needed for wheezing or shortness of breath. 75 mL 12   albuterol (VENTOLIN HFA) 108 (90 Base) MCG/ACT  inhaler Inhale 1-2 puffs into the lungs every 4 (four) hours as needed for wheezing or shortness of breath. 18 g 3   Albuterol Sulfate 2.5 MG/0.5ML NEBU Use 1 vial in nebulizer as directed every 6 hours as needed for wheezing or shortness of breath 30 mL 3   cyanocobalamin 1000 MCG tablet Take 1 tablet (1,000 mcg total) by mouth daily. 30 tablet 2   feeding supplement (ENSURE ENLIVE / ENSURE PLUS) LIQD Take as directed 948 mL 12   folic acid (FOLVITE) 1 MG tablet Take 1 tablet (1 mg total) by mouth daily. 30 tablet 2   furosemide (LASIX) 20 MG tablet Take 1 tablet (20 mg total) by mouth daily as needed for fluid or edema. 30 tablet 3   furosemide (LASIX) 20 MG tablet Take 1 tablet (20 mg total) by mouth daily. 4 tablet 0   HYDROmorphone (DILAUDID) 2 MG tablet Take 1 tablet (2 mg total) by mouth every 3 (three) hours as needed for severe pain (pain score 7-10). 90 tablet 0   lidocaine-prilocaine (EMLA) cream Apply to affected area once 30 g 3   methadone (DOLOPHINE) 5 MG tablet Take 1 tablet (5 mg total) by mouth every 12 (twelve) hours. 60 tablet 0   metoCLOPramide (REGLAN) 10 MG tablet Take 1 tablet (10 mg total) by mouth 4 (four) times daily  before meals and at bedtime. 120 tablet 3   Nutritional Supplements (FEEDING SUPPLEMENT, OSMOLITE 1.5 CAL,) LIQD At 60 mL/h over 18 hours on a daily basis. 30000 mL 0   ondansetron (ZOFRAN) 8 MG tablet Take 1 tablet (8 mg total) by mouth every 8 (eight) hours as needed for nausea or vomiting. Start on the third day after chemotherapy. 30 tablet 1   pantoprazole (PROTONIX) 40 MG tablet Take 1 tablet (40 mg total) by mouth 2 (two) times daily. 60 tablet 1   polyethylene glycol powder (GLYCOLAX/MIRALAX) 17 GM/SCOOP powder Dissolve 17 g in 4 oz of liquid and take by mouth daily. 476 g 0   potassium chloride (KLOR-CON) 20 MEQ packet MIx and drink 20 mEq (1 packet) by mouth daily. 30 packet 3   prochlorperazine (COMPAZINE) 10 MG tablet Take 1 tablet (10 mg total) by  mouth every 6 (six) hours as needed for nausea or vomiting. 30 tablet 1   senna-docusate (SENOKOT-S) 8.6-50 MG tablet Take 2 tablets by mouth at bedtime. 60 tablet 2   sucralfate (CARAFATE) 1 GM/10ML suspension Take 10 mLs (1 g total) by mouth 4 (four) times daily -  with meals and at bedtime. 1200 mL 2   traZODone (DESYREL) 50 MG tablet Take 1 tablet (50 mg total) by mouth at bedtime. 15 tablet 0   Water For Irrigation, Sterile (FREE WATER) SOLN Place 100 mLs into feeding tube 4 (four) times daily. 12000 mL 1   No current facility-administered medications for this encounter.     REVIEW OF SYSTEMS: On review of systems, the patient reports that he is having trouble with his bowel movements with constipation, poor tolerance to his tube feeds due to nausea and bloating. He hasn't had a full feeding in at least a week and a half. He denies any dark or maroon stools. He is very sleepy  and at times can sleep most of the day. He describes gnawing pain, and this is located in the upper epigastric region. He is taking pain medication regularly which keeps him from being as awake. He is drinking mostly powerade. He reports bright yellow urine. No other complaints are verbalized.   PHYSICAL EXAM:  Wt Readings from Last 3 Encounters:  08/29/23 161 lb 6 oz (73.2 kg)  08/21/23 172 lb 9.6 oz (78.3 kg)  08/15/23 170 lb 4.8 oz (77.2 kg)   Temp Readings from Last 3 Encounters:  08/29/23 (!) 97.5 F (36.4 C) (Temporal)  08/23/23 98.6 F (37 C) (Oral)  08/21/23 98 F (36.7 C) (Temporal)   BP Readings from Last 3 Encounters:  08/29/23 101/78  08/23/23 96/63  08/21/23 118/77   Pulse Readings from Last 3 Encounters:  08/29/23 67  08/23/23 69  08/21/23 74   Pain Assessment Pain Score: 0-No pain/10  In general this is a tired  appearing caucasian male in no acute distress. He's alert and oriented x4 and appropriate throughout the examination. Cardiopulmonary assessment is negative for acute distress  and he exhibits normal effort. He has edema of bilateral hands and feet.     ECOG = 1  0 - Asymptomatic (Fully active, able to carry on all predisease activities without restriction)  1 - Symptomatic but completely ambulatory (Restricted in physically strenuous activity but ambulatory and able to carry out work of a light or sedentary nature. For example, light housework, office work)  2 - Symptomatic, <50% in bed during the day (Ambulatory and capable of all self care but unable to carry out any work activities. Up and about more than 50% of waking hours)  3 - Symptomatic, >50% in bed, but not bedbound (Capable of only limited self-care, confined to bed or chair 50% or more of waking hours)  4 - Bedbound (Completely disabled. Cannot carry on any self-care. Totally confined to bed or chair)  5 - Death   Santiago Glad MM, Creech RH, Tormey DC, et al. [phone]). "Toxicity and response criteria of the [employer] [facility] [employer]". Am. Evlyn Clines. Oncol. 5 (6): 649-55    LABORATORY DATA:  Lab Results  Component Value Date   WBC 4.2 08/21/2023   HGB 9.3 (L) 08/21/2023   HCT 30.4 (L) 08/21/2023   MCV 77.4 (L) 08/21/2023   PLT 211 08/21/2023   Lab Results  Component Value Date   NA 137 08/21/2023   K 3.7 08/21/2023   CL 103 08/21/2023   CO2 29 08/21/2023   Lab Results  Component Value Date   ALT 7 08/21/2023   AST 12 (L) 08/21/2023   ALKPHOS 80 08/21/2023   BILITOT 0.5 08/21/2023      RADIOGRAPHY: No results found.     IMPRESSION/PLAN: 1. Stage IV, adenocarcinoma of the GE junction. Dr. Mitzi Hansen discusses the pathology findings and reviews the nature of locally advanced esophageal cancer. Dr. Mitzi Hansen recommends a palliative course of radiation for pain relief, to improve his ability to eat, and for reduction in risks of continued blood loss anemia. We discussed the risks, benefits, short, and long term effects of radiotherapy, as well as the palliative intent, and the patient is  interested in proceeding. Dr. Mitzi Hansen discusses the delivery and logistics of radiotherapy and anticipates a course of 3 weeks of radiotherapy to the esophagus tumor. Written consent is obtained and placed in the chart, a copy was provided to the patient. He will simulate today and start treatment early next  week. He will also proceed with chemotherapy as scheduled. 2. Moderate protein calorie malnutrition. The patient's edema is felt to be from this and since he has not been able to tolerate his tube feeds we will reach out to nutrition to see how they can support him.  In a visit lasting 60 minutes, greater than 50% of the time was spent face to face discussing the patient's condition, in preparation for the discussion, and coordinating the patient's care.   The above documentation reflects my direct findings during this shared patient visit. Please see the separate note by Dr. Mitzi Hansen on this date for the remainder of the patient's plan of care.    Osker Mason, [facility]   **Disclaimer: This note was dictated with voice recognition software. Similar sounding words can inadvertently be transcribed and this note may contain transcription errors which may not have been corrected upon publication of note.**

## 2023-08-29 NOTE — Progress Notes (Signed)
 GI Location of Tumor / Histology: Adenocarcinoma of gastroesophageal junction  Jesus Haynes presented to the ED 05/27/2023 with complaints of epigastric burning, abdominal pain, nausea, and retching.  Workup in the ED showed evidence of a mass at the [employer].  PET 09/20/2023:  PET 06/07/2023: Large hypermetabolic mass involving the distal esophagus and proximal stomach consistent with known primary esophageal carcinoma.  Multiple hypermetabolic retroperitoneal and lower mediastinal lymph nodes as described, consistent with locally advanced disease.  Suspected direct tumor extension into the pancreatic body.  Hypermetabolic node or peritoneal implant within the omentum.  No evidence of metastatic disease within the neck, chest or pelvis. No osseous abnormalities.  EGD 05/27/2023: large fungating and ulcerating mass in the lower third of the esophagus also overlapping the gastric cardia and portion of the fundus.   Biopsies of GE Junction Mass 05/28/2023    Past/Anticipated interventions by surgeon, if any:    Past/Anticipated interventions by medical oncology, if any:  Dr. Arlana Pouch -He started palliative systemic treatments with FOLFOX plus nivolumab from 07/23/2023.    Weight changes, if any: Stable.  Bowel/Bladder complaints, if any: He reports some constipation.  Nausea / Vomiting, if any: No  Pain issues, if any:  He reports abdominal pain, more so in the middle.    Appetite: J tube in place, he is still able to eat some PO.  He states he has not been using his J tube as often due to feeling full quickly.  He has been doing softer foods and more liquids.   SAFETY ISSUES: Prior radiation? No Pacemaker/ICD? No Possible current pregnancy? N/a Is the patient on methotrexate? No  Current Complaints/Details: Port Insertion 06/13/2023

## 2023-08-30 ENCOUNTER — Other Ambulatory Visit (HOSPITAL_COMMUNITY): Payer: Self-pay

## 2023-09-02 ENCOUNTER — Other Ambulatory Visit (HOSPITAL_COMMUNITY): Payer: Self-pay

## 2023-09-02 NOTE — Progress Notes (Unsigned)
 Palliative Medicine [facility]  Telephone:([phone]) [phone] Fax:(336) [phone]   Name: Jesus Haynes Date: 09/02/2023 MRN: [id number]  DOB: 10-31-1975  Patient Care Team: Ivonne Andrew, NP as PCP - General (Pulmonary Disease) Ripley Fraise Edmund Hilda Care Management Pasam, Archie Patten, MD as Consulting Physician (Oncology) Vida Rigger, MD as Consulting Physician (Gastroenterology) Pickenpack-Cousar, Arty Baumgartner, NP as Nurse Practitioner (Hospice and Palliative Medicine)    INTERVAL HISTORY: Jesus Haynes is a 48 y.o. 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 Palliative ask to see for symptom management and goals of care.   SOCIAL HISTORY:     reports that he quit smoking about 10 years ago. His smoking use included cigarettes. He has never used smokeless tobacco. He reports that he does not drink alcohol and does not use drugs.  ADVANCE DIRECTIVES:  Advanced directives on file naming Jesus Haynes as the primary and Jesus Haynes as the secondary decision makers should Jesus Haynes become unable to make decisions for himself.   CODE STATUS: Full code  PAST MEDICAL HISTORY: Past Medical History:  Diagnosis Date  . Asthma     ALLERGIES:  has no known allergies.  MEDICATIONS:  Current Outpatient Medications  Medication Sig Dispense Refill  . albuterol (PROVENTIL) (2.5 MG/3ML) 0.083% nebulizer solution Take 3 mLs (2.5 mg total) by nebulization every 6 (six) hours as needed for wheezing or shortness of breath. 75 mL 12  . albuterol (VENTOLIN HFA) 108 (90 Base) MCG/ACT inhaler Inhale 1-2 puffs into the lungs every 4 (four) hours as needed for wheezing or shortness of breath. 18 g 3  . Albuterol Sulfate 2.5 MG/0.5ML NEBU Use 1 vial in nebulizer as directed every 6 hours as needed for wheezing or shortness of breath 30 mL 3  .  cyanocobalamin 1000 MCG tablet Take 1 tablet (1,000 mcg total) by mouth daily. 30 tablet 2  . feeding supplement (ENSURE ENLIVE / ENSURE PLUS) LIQD Take as directed 948 mL 12  . folic acid (FOLVITE) 1 MG tablet Take 1 tablet (1 mg total) by mouth daily. 30 tablet 2  . furosemide (LASIX) 20 MG tablet Take 1 tablet (20 mg total) by mouth daily as needed for fluid or edema. 30 tablet 3  . furosemide (LASIX) 20 MG tablet Take 1 tablet (20 mg total) by mouth daily. 4 tablet 0  . HYDROmorphone (DILAUDID) 2 MG tablet Take 1 tablet (2 mg total) by mouth every 3 (three) hours as needed for severe pain (pain score 7-10). 90 tablet 0  . lidocaine-prilocaine (EMLA) cream Apply to affected area once 30 g 3  . methadone (DOLOPHINE) 5 MG tablet Take 1 tablet (5 mg total) by mouth every 12 (twelve) hours. 60 tablet 0  . metoCLOPramide (REGLAN) 10 MG tablet Take 1 tablet (10 mg total) by mouth 4 (four) times daily  before meals and at bedtime. 120 tablet 3  . Nutritional Supplements (FEEDING SUPPLEMENT, OSMOLITE 1.5 CAL,) LIQD At 60 mL/h over 18 hours on a daily basis. 30000 mL 0  . ondansetron (ZOFRAN) 8 MG tablet Take 1 tablet (8 mg total) by mouth every 8 (eight) hours as needed for nausea or vomiting. Start on the third day after chemotherapy. 30 tablet 1  . pantoprazole (PROTONIX) 40 MG tablet Take 1 tablet (40 mg total) by mouth 2 (two) times daily. 60 tablet 1  . polyethylene glycol powder (GLYCOLAX/MIRALAX) 17  GM/SCOOP powder Dissolve 17 g in 4 oz of liquid and take by mouth daily. 476 g 0  . potassium chloride (KLOR-CON) 20 MEQ packet MIx and drink 20 mEq (1 packet) by mouth daily. 30 packet 3  . prochlorperazine (COMPAZINE) 10 MG tablet Take 1 tablet (10 mg total) by mouth every 6 (six) hours as needed for nausea or vomiting. 30 tablet 1  . senna-docusate (SENOKOT-S) 8.6-50 MG tablet Take 2 tablets by mouth at bedtime. 60 tablet 2  . sucralfate (CARAFATE) 1 GM/10ML suspension Take 10 mLs (1 g total) by  mouth 4 (four) times daily -  with meals and at bedtime. 1200 mL 2  . traZODone (DESYREL) 50 MG tablet Take 1 tablet (50 mg total) by mouth at bedtime. 15 tablet 0  . Water For Irrigation, Sterile (FREE WATER) SOLN Place 100 mLs into feeding tube 4 (four) times daily. 12000 mL 1   No current facility-administered medications for this visit.    VITAL SIGNS: There were no vitals taken for this visit. There were no vitals filed for this visit.  Estimated body mass index is 26.05 kg/m as calculated from the following:   Height as of 08/29/23: 5\' 6"  (1.676 m).   Weight as of 08/29/23: 161 lb 6 oz (73.2 kg).  Drugs of Abuse     Component Value Date/Time   LABOPIA POSITIVE (A) 07/23/2023 1036   COCAINSCRNUR NONE DETECTED 07/23/2023 1036   LABBENZ POSITIVE (A) 07/23/2023 1036   AMPHETMU NONE DETECTED 07/23/2023 1036   THCU NONE DETECTED 07/23/2023 1036   LABBARB NONE DETECTED 07/23/2023 1036     PERFORMANCE STATUS (ECOG) : 1 - Symptomatic but completely ambulatory   Physical Exam General: NAD Cardiovascular: regular rate and rhythm Pulmonary: clear ant fields Abdomen: soft, nontender, + bowel sounds Extremities: no edema, no joint deformities Skin: no rashes Neurological: AAO x3  IMPRESSION:  Mr. Jesus Haynes presents to clinic for follow-up. No acute distress. He looks much better physically than previous visits. He has concerns about pain management and medication adjustments. He is accompanied by Jesus Haynes, the mother of his children, and Jesus Haynes, his mother-in-law via speakerphone. Denies nausea, vomiting, constipation, or diarrhea.   He has ongoing concerns about pain management following a recent hospital discharge. He is currently taking methadone twice daily and Dilaudid every three to four hours for pain control. He is particularly concerned about the potential reduction in Dilaudid, which he takes every three hours around the clock, though attempts have been made to extend it  to every four hours. His pain is primarily located in the stomach and abdomen, described as sharp and shooting, with intensity reaching ten out of ten at its worst. After taking pain medication, the pain typically decreases to a five to seven out of ten.  We discussed realistic goals for pain medication including ability to wean down on pain medication use as treatment begins to show some improvement and hopefully decrease levels of pain. Partick does acknowledge pain is not as severe as it was a month prior. He understands that we will continue to closely monitor and adjust as appropriately. Education provided on use of as needed medication and when to take. Understands if he can go longer periods without taking medication that would be ideal as medication is not scheduled. He is tolerating current regimen without difficulty. No signs of overt use or drowsiness.   There is a concern regarding Medicaid coverage for methadone, as it currently only covers a seven-day supply,  although he has received a 30-day supply. Notified family we will follow-up to see if medication requires prior authorization for full 30day supply. No adjustments to regimen at this time. He will focus on taking hydromorphone every 4hours as needed or longer pending levels of pain severity.   He has been using trazodone for sleep, which was prescribed in November but discontinued in January when he started methadone. He has been taking a 50 mg dose, although it was intended to be 25 mg. He reports difficulty sleeping sometimes due to pain other times insomnia. Also, some concerns with appetite. Marinol is current only Fire[employer] and unable to find at [employer]. Given insomnia concerns and appetite will consider mirtazapine at bedtime. Education provided. Patient and family verbalized understanding.   We reviewed all of his medications and administration times at length. His family is heavily involved in maintaining his  medications ensuring he is taking as prescribed and on schedule. Education provided on medications and interactions amongst each other. Patient aware not to take Zofran x3 days post chemo in the setting of Aloxi during treatment.   All questions answered and support provided.   The patient's medication management and refill process will be discussed further with his daughter. All questions answered and support provided.  I discussed the importance of continued conversation with family and their medical providers regarding overall plan of care and treatment options, ensuring decisions are within the context of the patients values and GOCs.  Assessment and Plan  Cancer-related Pain   Patient reports sharp, shooting pain in the abdomen, rating it as high as 10/10 at its worst. Pain is managed with Methadone and Dilaudid, but he expresses concern about potential reduction in pain medication.   -Continue Methadone 5mg  every 12 hours  -Dilaudid 2mg  every 4 hours as needed for severe pain.    -Monitor pain levels closely and adjust medication as necessary, with the goal of reducing pain medication as cancer treatment progresses and pain decreases.    Sleep Disturbance   Patient reports difficulty sleeping due to pain. Trazodone was recently introduced but may be interfering with the effectiveness of Methadone.   -Review the use of Trazodone and consider alternatives that are compatible with Methadone.   -Mirtazapine 7.5mg  at bedtime for insomnia and appetite   Nausea Post-Chemotherapy   Patient has been prescribed Ondansetron for nausea following chemotherapy.   -Continue Ondansetron as needed post-chemotherapy alternating with Compazine. -Education provided not to take Zofran for 3 days post treatment in the setting of Aloxi. -Reglan as prescribed.   Medication Supply and Insurance Coverage   Issues raised regarding the supply of Methadone and Dilaudid, and insurance coverage for Methadone.    -Investigate insurance coverage for Methadone and work on getting authorization for more than a 7-day supply.   -Check with pharmacy regarding the supply of Dilaudid and address any shortages.    Feeding Tube Maintenance   Discussion about the need for additional feeding tube parts for cleaning and replacement.   -Advise patient to contact [employer] for feeding tube parts as he has an open order for supplies.  I will plan to see patient back in 3-4 weeks. Sooner if needed.  Patient expressed understanding and was in agreement with this plan. He also understands that He can call the clinic at any time with any questions, concerns, or complaints.   Any controlled substances utilized were prescribed in the context of palliative care. PDMP has been reviewed.   Visit consisted of  counseling and education dealing with the complex and emotionally intense issues of symptom management and palliative care in the setting of serious and potentially life-threatening illness.  Willette Alma, AGPCNP-BC  Palliative Medicine Team/[facility]

## 2023-09-03 ENCOUNTER — Inpatient Hospital Stay (HOSPITAL_BASED_OUTPATIENT_CLINIC_OR_DEPARTMENT_OTHER): Payer: Medicaid Other | Admitting: Nurse Practitioner

## 2023-09-03 ENCOUNTER — Other Ambulatory Visit: Payer: Self-pay | Admitting: Nurse Practitioner

## 2023-09-03 ENCOUNTER — Other Ambulatory Visit (HOSPITAL_COMMUNITY): Payer: Self-pay

## 2023-09-03 ENCOUNTER — Other Ambulatory Visit: Payer: Self-pay

## 2023-09-03 ENCOUNTER — Ambulatory Visit
Admission: RE | Admit: 2023-09-03 | Discharge: 2023-09-03 | Disposition: A | Discharge status: Home / Self Care | Payer: Medicaid Other | Source: Ambulatory Visit | Attending: Radiation Oncology | Admitting: Radiation Oncology

## 2023-09-03 ENCOUNTER — Encounter: Payer: Self-pay | Admitting: Nurse Practitioner

## 2023-09-03 ENCOUNTER — Telehealth: Payer: Self-pay | Admitting: *Deleted

## 2023-09-03 ENCOUNTER — Inpatient Hospital Stay: Payer: Medicaid Other

## 2023-09-03 ENCOUNTER — Ambulatory Visit (HOSPITAL_COMMUNITY): Admission: RE | Admit: 2023-09-03 | Source: Ambulatory Visit

## 2023-09-03 VITALS — BP 113/73 | HR 67 | Temp 98.1°F | Resp 16 | Wt 165.8 lb

## 2023-09-03 DIAGNOSIS — D500 Iron deficiency anemia secondary to blood loss (chronic): Secondary | ICD-10-CM | POA: Insufficient documentation

## 2023-09-03 DIAGNOSIS — R630 Anorexia: Secondary | ICD-10-CM | POA: Insufficient documentation

## 2023-09-03 DIAGNOSIS — G893 Neoplasm related pain (acute) (chronic): Secondary | ICD-10-CM

## 2023-09-03 DIAGNOSIS — Z79899 Other long term (current) drug therapy: Secondary | ICD-10-CM | POA: Insufficient documentation

## 2023-09-03 DIAGNOSIS — D701 Agranulocytosis secondary to cancer chemotherapy: Secondary | ICD-10-CM | POA: Diagnosis not present

## 2023-09-03 DIAGNOSIS — G4709 Other insomnia: Secondary | ICD-10-CM

## 2023-09-03 DIAGNOSIS — Z95828 Presence of other vascular implants and grafts: Secondary | ICD-10-CM | POA: Insufficient documentation

## 2023-09-03 DIAGNOSIS — R530 Neoplastic (malignant) related fatigue: Secondary | ICD-10-CM

## 2023-09-03 DIAGNOSIS — T451X5A Adverse effect of antineoplastic and immunosuppressive drugs, initial encounter: Secondary | ICD-10-CM | POA: Insufficient documentation

## 2023-09-03 DIAGNOSIS — R600 Localized edema: Secondary | ICD-10-CM

## 2023-09-03 DIAGNOSIS — Z5112 Encounter for antineoplastic immunotherapy: Secondary | ICD-10-CM | POA: Diagnosis not present

## 2023-09-03 DIAGNOSIS — Z5111 Encounter for antineoplastic chemotherapy: Secondary | ICD-10-CM | POA: Insufficient documentation

## 2023-09-03 DIAGNOSIS — Z515 Encounter for palliative care: Secondary | ICD-10-CM

## 2023-09-03 DIAGNOSIS — Z87891 Personal history of nicotine dependence: Secondary | ICD-10-CM | POA: Diagnosis not present

## 2023-09-03 DIAGNOSIS — C160 Malignant neoplasm of cardia: Secondary | ICD-10-CM

## 2023-09-03 DIAGNOSIS — Z510 Encounter for antineoplastic radiation therapy: Secondary | ICD-10-CM | POA: Diagnosis not present

## 2023-09-03 DIAGNOSIS — R634 Abnormal weight loss: Secondary | ICD-10-CM | POA: Insufficient documentation

## 2023-09-03 DIAGNOSIS — D649 Anemia, unspecified: Secondary | ICD-10-CM | POA: Insufficient documentation

## 2023-09-03 LAB — CBC WITH DIFFERENTIAL (CANCER CENTER ONLY)
Abs Immature Granulocytes: 0.01 10*3/uL (ref 0.00–0.07)
Basophils Absolute: 0 10*3/uL (ref 0.0–0.1)
Basophils Relative: 1 %
Eosinophils Absolute: 0.3 10*3/uL (ref 0.0–0.5)
Eosinophils Relative: 5 %
HCT: 30.2 % — ABNORMAL LOW (ref 39.0–52.0)
Hemoglobin: 9.3 g/dL — ABNORMAL LOW (ref 13.0–17.0)
Immature Granulocytes: 0 %
Lymphocytes Relative: 27 %
Lymphs Abs: 1.3 10*3/uL (ref 0.7–4.0)
MCH: 23.1 pg — ABNORMAL LOW (ref 26.0–34.0)
MCHC: 30.8 g/dL (ref 30.0–36.0)
MCV: 75.1 fL — ABNORMAL LOW (ref 80.0–100.0)
Monocytes Absolute: 0.7 10*3/uL (ref 0.1–1.0)
Monocytes Relative: 15 %
Neutro Abs: 2.5 10*3/uL (ref 1.7–7.7)
Neutrophils Relative %: 52 %
Platelet Count: 197 10*3/uL (ref 150–400)
RBC: 4.02 MIL/uL — ABNORMAL LOW (ref 4.22–5.81)
RDW: 19.7 % — ABNORMAL HIGH (ref 11.5–15.5)
WBC Count: 4.9 10*3/uL (ref 4.0–10.5)
nRBC: 0 % (ref 0.0–0.2)

## 2023-09-03 LAB — RAD ONC ARIA SESSION SUMMARY
Course Elapsed Days: 0
Plan Fractions Treated to Date: 1
Plan Prescribed Dose Per Fraction: 2.5 Gy
Plan Total Fractions Prescribed: 15
Plan Total Prescribed Dose: 37.5 Gy
Reference Point Dosage Given to Date: 2.5 Gy
Reference Point Session Dosage Given: 2.5 Gy
Session Number: 1

## 2023-09-03 LAB — CMP (CANCER CENTER ONLY)
ALT: 10 U/L (ref 0–44)
AST: 13 U/L — ABNORMAL LOW (ref 15–41)
Albumin: 3.2 g/dL — ABNORMAL LOW (ref 3.5–5.0)
Alkaline Phosphatase: 56 U/L (ref 38–126)
Anion gap: 4 — ABNORMAL LOW (ref 5–15)
BUN: 7 mg/dL (ref 6–20)
CO2: 31 mmol/L (ref 22–32)
Calcium: 8.8 mg/dL — ABNORMAL LOW (ref 8.9–10.3)
Chloride: 102 mmol/L (ref 98–111)
Creatinine: 0.62 mg/dL (ref 0.61–1.24)
GFR, Estimated: >60 mL/min (ref >60)
Glucose, Bld: 131 mg/dL — ABNORMAL HIGH (ref 70–99)
Potassium: 3.5 mmol/L (ref 3.5–5.1)
Sodium: 137 mmol/L (ref 135–145)
Total Bilirubin: 0.4 mg/dL (ref 0.0–1.2)
Total Protein: 5.8 g/dL — ABNORMAL LOW (ref 6.5–8.1)

## 2023-09-03 MED ORDER — SODIUM CHLORIDE 0.9% FLUSH
10.0000 mL | Freq: Once | INTRAVENOUS | Status: AC
  Administered 2023-09-03: 10 mL

## 2023-09-03 MED ORDER — HYDROMORPHONE HCL 2 MG PO TABS
2.0000 mg | ORAL_TABLET | ORAL | 0 refills | Status: DC | PRN
Start: 2023-09-03 — End: 2023-10-02
  Filled 2023-09-03: qty 90, 12d supply, fill #0

## 2023-09-03 MED ORDER — HEPARIN SOD (PORK) LOCK FLUSH 100 UNIT/ML IV SOLN
500.0000 [IU] | Freq: Once | INTRAVENOUS | Status: AC
  Administered 2023-09-03: 500 [IU]

## 2023-09-03 MED ORDER — METHADONE HCL 5 MG PO TABS
5.0000 mg | ORAL_TABLET | Freq: Two times a day (BID) | ORAL | 0 refills | Status: DC
  Filled 2023-09-03: qty 60, 30d supply, fill #0

## 2023-09-03 NOTE — Progress Notes (Signed)
 Second request sent requesting contact to MIL Nicholaus Bloom regarding applying for [employer]. Reached out to registrar whom registered patient for ROI to be completed and my card given for them to contact me at earliest convenience. Received message from registrar that this has been completed.

## 2023-09-03 NOTE — Progress Notes (Signed)
 [facility] CSW Progress Note  Merchant navy officer and re-sent grant referral  to Engineer, manufacturing systems, at the request of family per Palliative Team.    Marguerita Merles, LCSW Clinical Social Worker [facility]

## 2023-09-03 NOTE — Telephone Encounter (Signed)
 Scheduled for US Doppler today at [facility] at 2 pm per order from Santiago Glad, NP. Patient notified of test time today while in radiation from radiation technologists.  Following radiation appt, patient decided he was too tired to have doppler today. Provider was notified by CC scheduling that patient asked to have it rescheduled.  Contacted patient - spoke to family member Tresa Endo) at his number and provided info regarding necessity of rescheduling test. Tresa Endo stated she would speak with patient and assist w/rescheduling test.  Contacted CV services to cancel today's appt and requested US Doppler contact patient to reschedule. Nicole Cella states she will contact him.  CV notified this office that patient has been rescheduled for Friday 09/06/23 at 3 pm. Provider updated.

## 2023-09-03 NOTE — Progress Notes (Signed)
 Patient Care Team: Ivonne Andrew, NP as PCP - General (Pulmonary Disease) Clemons, Edmund Hilda Care Management Pasam, Archie Patten, MD as Consulting Physician (Oncology) Vida Rigger, MD as Consulting Physician (Gastroenterology) Pickenpack-Cousar, Arty Baumgartner, NP as Nurse Practitioner (Hospice and Palliative Medicine)   CHIEF COMPLAINT: Follow up GEJ carcinoma   Oncology History  Adenocarcinoma of gastroesophageal junction (HCC)  05/29/2023 Initial Diagnosis   Adenocarcinoma of gastroesophageal junction (HCC)   07/18/2023 Cancer Staging   Staging form: Esophagus - Adenocarcinoma, AJCC 8th Edition - Clinical: Stage IVB (cT4, cN3, cM1, G3) - Signed by Meryl Crutch, MD on 07/18/2023 Histologic grading system: 3 grade system   07/23/2023 -  Chemotherapy   Patient is on Treatment Plan : GASTROESOPHAGEAL FOLFOX + Nivolumab q14d        CURRENT THERAPY: FOLFOX/Nivo q14 days, starting 07/23/23  INTERVAL HISTORY Mr. Fore returns with Sharyl Nimrod his support person, and mother on the phone, for follow up and treatment as scheduled. Last seen by Dr. Arlana Pouch 08/21/23 with cycle 3 Nivo and dose-reduced FOLFOX.  The 4 days after treatment of the worst, he falls into a fog "clams up and spaces out."  It is like he is aware but slower response times.  Patient describes this as "the lights are on but no one's home." He doesn't get up out of bed/chair or eat/drink much and is 4 days.  He can eat or drink pretty much anything he wants, does not use the tube.  He has cold sensitivity without neuropathy in the absence of cold.  He comes out of the fog after 7 days. Using in haler more frequently but this is stable, has asthma. Leg swelling remains a problem, legs are tight and uncomfortable, using compression boots. He has constipation after treatment then bowels normalized, denies nausea/vomiting.  Manages abdominal pain with methadone every 12 and Dilaudid every 3 which helps.  He will be starting a course  of palliative radiation later today.   ROS  All other systems reviewed and negative   Past Medical History:  Diagnosis Date   Asthma      Past Surgical History:  Procedure Laterality Date   BIOPSY  05/28/2023   Procedure: BIOPSY;  Surgeon: Vida Rigger, MD;  Location: [facility];  Service: Gastroenterology;;   ESOPHAGOGASTRODUODENOSCOPY (EGD) WITH PROPOFOL N/A 05/28/2023   Procedure: ESOPHAGOGASTRODUODENOSCOPY (EGD) WITH PROPOFOL;  Surgeon: Vida Rigger, MD;  Location: [facility];  Service: Gastroenterology;  Laterality: N/A;   IR IMAGING GUIDED PORT INSERTION  06/13/2023   UMBILICAL HERNIA REPAIR N/A 06/18/2023   Procedure: PLACEMENT OF J TUBE;  Surgeon: Fritzi Mandes, MD;  Location: [facility] ORS;  Service: General;  Laterality: N/A;     Outpatient Encounter Medications as of 09/03/2023  Medication Sig   albuterol (PROVENTIL) (2.5 MG/3ML) 0.083% nebulizer solution Take 3 mLs (2.5 mg total) by nebulization every 6 (six) hours as needed for wheezing or shortness of breath.   albuterol (VENTOLIN HFA) 108 (90 Base) MCG/ACT inhaler Inhale 1-2 puffs into the lungs every 4 (four) hours as needed for wheezing or shortness of breath.   Albuterol Sulfate 2.5 MG/0.5ML NEBU Use 1 vial in nebulizer as directed every 6 hours as needed for wheezing or shortness of breath   cyanocobalamin 1000 MCG tablet Take 1 tablet (1,000 mcg total) by mouth daily.   feeding supplement (ENSURE ENLIVE / ENSURE PLUS) LIQD Take as directed   folic acid (FOLVITE) 1 MG tablet Take 1 tablet (1 mg total) by mouth  daily.   furosemide (LASIX) 20 MG tablet Take 1 tablet (20 mg total) by mouth daily as needed for fluid or edema.   furosemide (LASIX) 20 MG tablet Take 1 tablet (20 mg total) by mouth daily.   HYDROmorphone (DILAUDID) 2 MG tablet Take 1 tablet (2 mg total) by mouth every 3 (three) hours as needed for severe pain (pain score 7-10).   lidocaine-prilocaine (EMLA) cream Apply to affected area once   methadone  (DOLOPHINE) 5 MG tablet Take 1 tablet (5 mg total) by mouth every 12 (twelve) hours.   metoCLOPramide (REGLAN) 10 MG tablet Take 1 tablet (10 mg total) by mouth 4 (four) times daily  before meals and at bedtime.   Nutritional Supplements (FEEDING SUPPLEMENT, OSMOLITE 1.5 CAL,) LIQD At 60 mL/h over 18 hours on a daily basis.   ondansetron (ZOFRAN) 8 MG tablet Take 1 tablet (8 mg total) by mouth every 8 (eight) hours as needed for nausea or vomiting. Start on the third day after chemotherapy.   pantoprazole (PROTONIX) 40 MG tablet Take 1 tablet (40 mg total) by mouth 2 (two) times daily.   polyethylene glycol powder (GLYCOLAX/MIRALAX) 17 GM/SCOOP powder Dissolve 17 g in 4 oz of liquid and take by mouth daily.   potassium chloride (KLOR-CON) 20 MEQ packet MIx and drink 20 mEq (1 packet) by mouth daily.   prochlorperazine (COMPAZINE) 10 MG tablet Take 1 tablet (10 mg total) by mouth every 6 (six) hours as needed for nausea or vomiting.   senna-docusate (SENOKOT-S) 8.6-50 MG tablet Take 2 tablets by mouth at bedtime.   sucralfate (CARAFATE) 1 GM/10ML suspension Take 10 mLs (1 g total) by mouth 4 (four) times daily -  with meals and at bedtime.   traZODone (DESYREL) 50 MG tablet Take 1 tablet (50 mg total) by mouth at bedtime.   Water For Irrigation, Sterile (FREE WATER) SOLN Place 100 mLs into feeding tube 4 (four) times daily.   [DISCONTINUED] cetirizine (ZYRTEC) 10 MG tablet Take 1 tablet (10 mg total) by mouth daily. (Patient not taking: Reported on 01/26/2019)   [DISCONTINUED] fluticasone (FLONASE) 50 MCG/ACT nasal spray Place 1-2 sprays into both nostrils daily. (Patient not taking: Reported on 01/26/2019)   No facility-administered encounter medications on file as of 09/03/2023.     Today's Vitals   09/03/23 1033  BP: 113/73  Pulse: 67  Resp: 16  Temp: 98.1 F (36.7 C)  TempSrc: Temporal  SpO2: 98%  Weight: 165 lb 12.8 oz (75.2 kg)   Body mass index is 26.76 kg/m.   PHYSICAL  EXAM GENERAL:Awake, drowsy, no distress and comfortable SKIN: dry skin, no rash  EYES: sclera clear NECK: without mass LYMPH:  no palpable cervical or supraclavicular lymphadenopathy  LUNGS: clear with normal breathing effort HEART: regular rate & rhythm, marked bilateral pitting lower extremity edema ABDOMEN: abdomen soft, non-tender and normal bowel sounds. J tube present NEURO: awake, drowsy, flat affect but oriented x 3 with fluent speech, no focal motor/sensory deficits PAC without erythema    CBC    Component Value Date/Time   WBC 4.9 09/03/2023 1021   WBC 5.6 08/02/2023 0403   RBC 4.02 (L) 09/03/2023 1021   HGB 9.3 (L) 09/03/2023 1021   HGB 10.5 (L) 06/12/2023 1413   HCT 30.2 (L) 09/03/2023 1021   HCT 34.2 (L) 06/12/2023 1413   PLT 197 09/03/2023 1021   PLT 543 (H) 06/12/2023 1413   MCV 75.1 (L) 09/03/2023 1021   MCV 82 06/12/2023 1413  MCH 23.1 (L) 09/03/2023 1021   MCHC 30.8 09/03/2023 1021   RDW 19.7 (H) 09/03/2023 1021   RDW 13.6 06/12/2023 1413   LYMPHSABS 1.3 09/03/2023 1021   MONOABS 0.7 09/03/2023 1021   EOSABS 0.3 09/03/2023 1021   BASOSABS 0.0 09/03/2023 1021     CMP     Component Value Date/Time   NA 137 09/03/2023 1021   NA 139 06/12/2023 1413   K 3.5 09/03/2023 1021   CL 102 09/03/2023 1021   CO2 31 09/03/2023 1021   GLUCOSE 131 (H) 09/03/2023 1021   BUN 7 09/03/2023 1021   BUN 14 06/12/2023 1413   CREATININE 0.62 09/03/2023 1021   CALCIUM 8.8 (L) 09/03/2023 1021   PROT 5.8 (L) 09/03/2023 1021   PROT 7.0 06/12/2023 1413   ALBUMIN 3.2 (L) 09/03/2023 1021   ALBUMIN 3.9 (L) 06/12/2023 1413   AST 13 (L) 09/03/2023 1021   ALT 10 09/03/2023 1021   ALKPHOS 56 09/03/2023 1021   BILITOT 0.4 09/03/2023 1021   GFRNONAA >60 09/03/2023 1021   GFRAA >60 08/24/2018 0750     ASSESSMENT & PLAN: 48 year old male with   Adenocarcinoma of gastroesophageal junction (GEJ), 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 Rundle appears stable. S/p C3 FOLFOX/Nivo, tolerating fairly well with neurotoxicity and weight loss. He is able to recover between 7-10 days. PS remains adequate for treatment  -We discussed symptom management for SEs, nutrition/hydration, activity, pain management. He is not using the feeding tube, agrees to increase oral nutrition/hydration and activity to see if this improves brain fog. Pain management per palliative care -Labs reviewed, adequate for C4 FOLFOX/Nivo on 3/5 as scheduled.  -Due to starting a 3 week course of palliative RT to the primary tumor today, will keep current dose reduction and omit 5FU bolus with this cycle -F/up and C5 in 2 weeks -Restage in March  Leg edema  -Onset with diagnosis and treatment, stable per pt -Not improving much with lasix 20 mg and compression stockings -Increase protein. Scr is normal so he can increase lasix to 20 mg BID -I recommend b/l leg doppler to r/o DVT, will arrange this week  Anemia -Secondary to tumor bleeding and chemotherapy  -Required admission and blood transfusion after C1 chemo for Hgb 6.8 -Improved and stable   PLAN: -Labs reviewed -Symptom management for neurotoxicity, weight loss, activity intolerance, pain, leg edema (increase lasix to 20 mg BID) -Palliative care f/up today as scheduled -Begin 3 week course of palliative RT today as scheduled -Proceed with C4 FOLFOX/Nivo at current dose reduction and omit 5FU bolus on 3/5 -F/up and C5 in 2 weeks  -Reviewed the plan with Dr. Arlana Pouch and pharmacy   All questions were answered. The patient knows to call the clinic with any problems, questions or concerns. No barriers to learning were detected. I spent 30 minutes counseling the patient face to face. The total time spent in the  appointment was 40 minutes and more than 50% was on counseling, review of test results, and coordination of care.   Santiago Glad, NP-C 09/03/2023

## 2023-09-04 ENCOUNTER — Other Ambulatory Visit (HOSPITAL_COMMUNITY): Payer: Self-pay

## 2023-09-04 ENCOUNTER — Encounter: Payer: Self-pay | Admitting: Nurse Practitioner

## 2023-09-04 ENCOUNTER — Ambulatory Visit: Payer: Medicaid Other | Admitting: Oncology

## 2023-09-04 ENCOUNTER — Other Ambulatory Visit: Payer: Medicaid Other

## 2023-09-04 ENCOUNTER — Other Ambulatory Visit: Payer: Self-pay

## 2023-09-04 ENCOUNTER — Ambulatory Visit: Payer: Medicaid Other

## 2023-09-04 ENCOUNTER — Inpatient Hospital Stay: Payer: Medicaid Other

## 2023-09-04 ENCOUNTER — Ambulatory Visit
Admission: RE | Admit: 2023-09-04 | Discharge: 2023-09-04 | Disposition: A | Discharge status: Home / Self Care | Payer: Medicaid Other | Source: Ambulatory Visit | Attending: Radiation Oncology | Admitting: Radiation Oncology

## 2023-09-04 ENCOUNTER — Encounter: Payer: Self-pay | Admitting: Oncology

## 2023-09-04 ENCOUNTER — Inpatient Hospital Stay: Payer: Medicaid Other | Admitting: Dietician

## 2023-09-04 VITALS — BP 122/78 | HR 70 | Temp 97.6°F | Resp 18

## 2023-09-04 DIAGNOSIS — Z510 Encounter for antineoplastic radiation therapy: Secondary | ICD-10-CM | POA: Diagnosis not present

## 2023-09-04 DIAGNOSIS — C160 Malignant neoplasm of cardia: Secondary | ICD-10-CM

## 2023-09-04 LAB — RAD ONC ARIA SESSION SUMMARY
Course Elapsed Days: 1
Plan Fractions Treated to Date: 2
Plan Prescribed Dose Per Fraction: 2.5 Gy
Plan Total Fractions Prescribed: 15
Plan Total Prescribed Dose: 37.5 Gy
Reference Point Dosage Given to Date: 5 Gy
Reference Point Session Dosage Given: 2.5 Gy
Session Number: 2

## 2023-09-04 MED ORDER — DEXAMETHASONE SODIUM PHOSPHATE 10 MG/ML IJ SOLN
10.0000 mg | Freq: Once | INTRAMUSCULAR | Status: AC
  Administered 2023-09-04: 10 mg via INTRAVENOUS
  Filled 2023-09-04: qty 1

## 2023-09-04 MED ORDER — DEXTROSE 5 % IV SOLN: INTRAVENOUS | Status: DC

## 2023-09-04 MED ORDER — OXALIPLATIN CHEMO INJECTION 100 MG/20ML
68.0000 mg/m2 | Freq: Once | INTRAVENOUS | Status: AC
  Administered 2023-09-04: 130 mg via INTRAVENOUS
  Filled 2023-09-04: qty 26

## 2023-09-04 MED ORDER — SODIUM CHLORIDE 0.9 % IV SOLN
2400.0000 mg/m2 | INTRAVENOUS | Status: DC
  Administered 2023-09-04: 5000 mg via INTRAVENOUS
  Filled 2023-09-04: qty 100

## 2023-09-04 MED ORDER — PALONOSETRON HCL INJECTION 0.25 MG/5ML
0.2500 mg | Freq: Once | INTRAVENOUS | Status: AC
Start: 2023-09-04 — End: 2023-09-04
  Administered 2023-09-04: 0.25 mg via INTRAVENOUS
  Filled 2023-09-04: qty 5

## 2023-09-04 MED ORDER — LEUCOVORIN CALCIUM INJECTION 350 MG
400.0000 mg/m2 | Freq: Once | INTRAVENOUS | Status: AC
  Administered 2023-09-04: 768 mg via INTRAVENOUS
  Filled 2023-09-04: qty 25

## 2023-09-04 MED ORDER — SODIUM CHLORIDE 0.9 % IV SOLN
240.0000 mg | Freq: Once | INTRAVENOUS | Status: AC
  Administered 2023-09-04: 240 mg via INTRAVENOUS
  Filled 2023-09-04: qty 24

## 2023-09-04 NOTE — Progress Notes (Signed)
 Nutrition Follow-up:  Patient with metastatic adenocarcinoma of GE junction. He is receiving palliative Folfox + Nivolumab q14d. Patient is under the care of Dr. Arlana Pouch.   1/28-1/31 hospital admit - UGI; acute/chronic blood loss anemia  12/13-1/9 hospital admit - FTT -S/p open Jtube 12/17 (DME: [facility])   Met with patient in infusion. He is alert and sitting up in chair eating peanut butter crackers this morning. Patient reports good appetite and eating well. Had a frozen pepperoni pizza for dinner which he ate all of. He is unable to recall what else he had. States he is eating whenever someone offers him food. He is drinking mostly gatorade. He denies nausea, vomiting, diarrhea. He has intermittent constipation. Patient has not been using feeding tube other than for medications. He tolerates formula, however he does not care for the pump.    Medications: reglan, zofran, klor-con, compazine, carafate, trazodone  Labs: glucose 131, albumin 3.2 (trending up)  Anthropometrics: Wt 165 lb 12.8 oz on 3/4 decreased 4% in 2 weeks - severe for time frame (pt reports incorrect weights - does not feel he has had recent loss)  2/19 - 172 lb 9.6 oz  2/5 - 168 lb 8 oz  Estimated Energy Needs  Kcals: 2350-2750 Protein: 115-130 Fluid: >2.3 L  NUTRITION DIAGNOSIS: Inadequate oral intake improving    MALNUTRITION DIAGNOSIS: Moderate malnutrition ongoing   INTERVENTION:  Encourage high calorie high protein foods for wt maintenance  Continue FWF    MONITORING, EVALUATION, GOAL: wt trends, intake   NEXT VISIT: Wednesday April 2 during infusion

## 2023-09-05 ENCOUNTER — Ambulatory Visit
Admission: RE | Admit: 2023-09-05 | Discharge: 2023-09-05 | Disposition: A | Discharge status: Home / Self Care | Payer: Medicaid Other | Source: Ambulatory Visit | Attending: Radiation Oncology | Admitting: Radiation Oncology

## 2023-09-05 ENCOUNTER — Ambulatory Visit: Payer: Medicaid Other

## 2023-09-05 ENCOUNTER — Other Ambulatory Visit: Payer: Self-pay

## 2023-09-05 DIAGNOSIS — Z510 Encounter for antineoplastic radiation therapy: Secondary | ICD-10-CM | POA: Diagnosis not present

## 2023-09-05 LAB — RAD ONC ARIA SESSION SUMMARY
Course Elapsed Days: 2
Plan Fractions Treated to Date: 3
Plan Prescribed Dose Per Fraction: 2.5 Gy
Plan Total Fractions Prescribed: 15
Plan Total Prescribed Dose: 37.5 Gy
Reference Point Dosage Given to Date: 7.5 Gy
Reference Point Session Dosage Given: 2.5 Gy
Session Number: 3

## 2023-09-06 ENCOUNTER — Other Ambulatory Visit: Payer: Self-pay

## 2023-09-06 ENCOUNTER — Other Ambulatory Visit: Payer: Self-pay | Admitting: Nurse Practitioner

## 2023-09-06 ENCOUNTER — Ambulatory Visit
Admission: RE | Admit: 2023-09-06 | Discharge: 2023-09-06 | Disposition: A | Discharge status: Home / Self Care | Source: Ambulatory Visit | Attending: Radiation Oncology | Admitting: Radiation Oncology

## 2023-09-06 ENCOUNTER — Encounter: Payer: Self-pay | Admitting: Oncology

## 2023-09-06 ENCOUNTER — Other Ambulatory Visit (HOSPITAL_COMMUNITY): Payer: Self-pay

## 2023-09-06 ENCOUNTER — Ambulatory Visit
Admission: RE | Admit: 2023-09-06 | Discharge: 2023-09-06 | Disposition: A | Discharge status: Home / Self Care | Payer: Medicaid Other | Source: Ambulatory Visit | Attending: Radiation Oncology | Admitting: Radiation Oncology

## 2023-09-06 ENCOUNTER — Ambulatory Visit (HOSPITAL_BASED_OUTPATIENT_CLINIC_OR_DEPARTMENT_OTHER)
Admission: RE | Admit: 2023-09-06 | Discharge: 2023-09-06 | Disposition: A | Discharge status: Home / Self Care | Source: Ambulatory Visit | Attending: Nurse Practitioner | Admitting: Nurse Practitioner

## 2023-09-06 ENCOUNTER — Inpatient Hospital Stay: Payer: Medicaid Other

## 2023-09-06 VITALS — BP 106/67 | HR 70 | Temp 98.0°F | Resp 20

## 2023-09-06 DIAGNOSIS — Z510 Encounter for antineoplastic radiation therapy: Secondary | ICD-10-CM | POA: Diagnosis not present

## 2023-09-06 DIAGNOSIS — Z95828 Presence of other vascular implants and grafts: Secondary | ICD-10-CM

## 2023-09-06 DIAGNOSIS — R600 Localized edema: Secondary | ICD-10-CM | POA: Insufficient documentation

## 2023-09-06 DIAGNOSIS — Z515 Encounter for palliative care: Secondary | ICD-10-CM

## 2023-09-06 DIAGNOSIS — C160 Malignant neoplasm of cardia: Secondary | ICD-10-CM

## 2023-09-06 LAB — RAD ONC ARIA SESSION SUMMARY
Course Elapsed Days: 3
Plan Fractions Treated to Date: 4
Plan Prescribed Dose Per Fraction: 2.5 Gy
Plan Total Fractions Prescribed: 15
Plan Total Prescribed Dose: 37.5 Gy
Reference Point Dosage Given to Date: 10 Gy
Reference Point Session Dosage Given: 2.5 Gy
Session Number: 4

## 2023-09-06 MED ORDER — SODIUM CHLORIDE 0.9% FLUSH
10.0000 mL | Freq: Once | INTRAVENOUS | Status: AC
  Administered 2023-09-06: 10 mL

## 2023-09-06 MED ORDER — TRAZODONE HCL 50 MG PO TABS
50.0000 mg | ORAL_TABLET | Freq: Every day | ORAL | 0 refills | Status: DC
  Filled 2023-09-06: qty 15, 15d supply, fill #0

## 2023-09-06 MED ORDER — HEPARIN SOD (PORK) LOCK FLUSH 100 UNIT/ML IV SOLN
500.0000 [IU] | Freq: Once | INTRAVENOUS | Status: AC
  Administered 2023-09-06: 500 [IU]

## 2023-09-09 ENCOUNTER — Other Ambulatory Visit: Payer: Self-pay

## 2023-09-09 ENCOUNTER — Ambulatory Visit
Admission: RE | Admit: 2023-09-09 | Discharge: 2023-09-09 | Disposition: A | Discharge status: Home / Self Care | Payer: Medicaid Other | Source: Ambulatory Visit | Attending: Radiation Oncology

## 2023-09-09 ENCOUNTER — Encounter: Payer: Self-pay | Admitting: Nurse Practitioner

## 2023-09-09 DIAGNOSIS — Z510 Encounter for antineoplastic radiation therapy: Secondary | ICD-10-CM | POA: Diagnosis not present

## 2023-09-09 LAB — RAD ONC ARIA SESSION SUMMARY
Course Elapsed Days: 6
Plan Fractions Treated to Date: 5
Plan Prescribed Dose Per Fraction: 2.5 Gy
Plan Total Fractions Prescribed: 15
Plan Total Prescribed Dose: 37.5 Gy
Reference Point Dosage Given to Date: 12.5 Gy
Reference Point Session Dosage Given: 2.5 Gy
Session Number: 5

## 2023-09-10 ENCOUNTER — Ambulatory Visit
Admission: RE | Admit: 2023-09-10 | Discharge: 2023-09-10 | Disposition: A | Discharge status: Home / Self Care | Payer: Medicaid Other | Source: Ambulatory Visit | Attending: Radiation Oncology | Admitting: Radiation Oncology

## 2023-09-10 ENCOUNTER — Ambulatory Visit (HOSPITAL_COMMUNITY)
Admission: RE | Admit: 2023-09-10 | Discharge: 2023-09-10 | Disposition: A | Discharge status: Home / Self Care | Source: Ambulatory Visit | Attending: Oncology | Admitting: Oncology

## 2023-09-10 ENCOUNTER — Other Ambulatory Visit: Payer: Self-pay

## 2023-09-10 DIAGNOSIS — C160 Malignant neoplasm of cardia: Secondary | ICD-10-CM | POA: Insufficient documentation

## 2023-09-10 DIAGNOSIS — Z510 Encounter for antineoplastic radiation therapy: Secondary | ICD-10-CM | POA: Diagnosis not present

## 2023-09-10 LAB — RAD ONC ARIA SESSION SUMMARY
Course Elapsed Days: 7
Plan Fractions Treated to Date: 6
Plan Prescribed Dose Per Fraction: 2.5 Gy
Plan Total Fractions Prescribed: 15
Plan Total Prescribed Dose: 37.5 Gy
Reference Point Dosage Given to Date: 15 Gy
Reference Point Session Dosage Given: 2.5 Gy
Session Number: 6

## 2023-09-10 LAB — GLUCOSE, CAPILLARY: Glucose-Capillary: 100 mg/dL — ABNORMAL HIGH (ref 70–99)

## 2023-09-10 MED ORDER — FLUDEOXYGLUCOSE F - 18 (FDG) INJECTION
7.9700 | Freq: Once | INTRAVENOUS | Status: AC
  Administered 2023-09-10: 7.97 via INTRAVENOUS

## 2023-09-11 ENCOUNTER — Other Ambulatory Visit: Payer: Self-pay

## 2023-09-11 ENCOUNTER — Other Ambulatory Visit: Payer: Self-pay | Admitting: Oncology

## 2023-09-11 ENCOUNTER — Ambulatory Visit
Admission: RE | Admit: 2023-09-11 | Discharge: 2023-09-11 | Disposition: A | Discharge status: Home / Self Care | Payer: Medicaid Other | Source: Ambulatory Visit | Attending: Radiation Oncology | Admitting: Radiation Oncology

## 2023-09-11 DIAGNOSIS — Z510 Encounter for antineoplastic radiation therapy: Secondary | ICD-10-CM | POA: Diagnosis not present

## 2023-09-11 LAB — RAD ONC ARIA SESSION SUMMARY
Course Elapsed Days: 8
Plan Fractions Treated to Date: 7
Plan Prescribed Dose Per Fraction: 2.5 Gy
Plan Total Fractions Prescribed: 15
Plan Total Prescribed Dose: 37.5 Gy
Reference Point Dosage Given to Date: 17.5 Gy
Reference Point Session Dosage Given: 2.5 Gy
Session Number: 7

## 2023-09-12 ENCOUNTER — Ambulatory Visit
Admission: RE | Admit: 2023-09-12 | Discharge: 2023-09-12 | Disposition: A | Discharge status: Home / Self Care | Payer: Medicaid Other | Source: Ambulatory Visit | Attending: Radiation Oncology | Admitting: Radiation Oncology

## 2023-09-12 ENCOUNTER — Other Ambulatory Visit (HOSPITAL_COMMUNITY): Payer: Self-pay

## 2023-09-12 ENCOUNTER — Other Ambulatory Visit: Payer: Self-pay

## 2023-09-12 DIAGNOSIS — Z510 Encounter for antineoplastic radiation therapy: Secondary | ICD-10-CM | POA: Diagnosis not present

## 2023-09-12 LAB — RAD ONC ARIA SESSION SUMMARY
Course Elapsed Days: 9
Plan Fractions Treated to Date: 8
Plan Prescribed Dose Per Fraction: 2.5 Gy
Plan Total Fractions Prescribed: 15
Plan Total Prescribed Dose: 37.5 Gy
Reference Point Dosage Given to Date: 20 Gy
Reference Point Session Dosage Given: 2.5 Gy
Session Number: 8

## 2023-09-13 ENCOUNTER — Ambulatory Visit
Admission: RE | Admit: 2023-09-13 | Discharge: 2023-09-13 | Disposition: A | Discharge status: Home / Self Care | Payer: Medicaid Other | Source: Ambulatory Visit | Attending: Radiation Oncology | Admitting: Radiation Oncology

## 2023-09-13 ENCOUNTER — Other Ambulatory Visit: Payer: Self-pay

## 2023-09-13 ENCOUNTER — Ambulatory Visit
Admission: RE | Admit: 2023-09-13 | Discharge: 2023-09-13 | Disposition: A | Discharge status: Home / Self Care | Source: Ambulatory Visit | Attending: Radiation Oncology | Admitting: Radiation Oncology

## 2023-09-13 ENCOUNTER — Other Ambulatory Visit (HOSPITAL_COMMUNITY): Payer: Self-pay

## 2023-09-13 DIAGNOSIS — Z510 Encounter for antineoplastic radiation therapy: Secondary | ICD-10-CM | POA: Diagnosis not present

## 2023-09-13 LAB — RAD ONC ARIA SESSION SUMMARY
Course Elapsed Days: 10
Plan Fractions Treated to Date: 9
Plan Prescribed Dose Per Fraction: 2.5 Gy
Plan Total Fractions Prescribed: 15
Plan Total Prescribed Dose: 37.5 Gy
Reference Point Dosage Given to Date: 22.5 Gy
Reference Point Session Dosage Given: 2.5 Gy
Session Number: 9

## 2023-09-16 ENCOUNTER — Other Ambulatory Visit: Payer: Self-pay

## 2023-09-16 ENCOUNTER — Ambulatory Visit
Admission: RE | Admit: 2023-09-16 | Discharge: 2023-09-16 | Disposition: A | Discharge status: Home / Self Care | Payer: Medicaid Other | Source: Ambulatory Visit | Attending: Radiation Oncology

## 2023-09-16 ENCOUNTER — Ambulatory Visit: Payer: Medicaid Other

## 2023-09-16 DIAGNOSIS — Z510 Encounter for antineoplastic radiation therapy: Secondary | ICD-10-CM | POA: Diagnosis not present

## 2023-09-16 LAB — RAD ONC ARIA SESSION SUMMARY
Course Elapsed Days: 13
Plan Fractions Treated to Date: 10
Plan Prescribed Dose Per Fraction: 2.5 Gy
Plan Total Fractions Prescribed: 15
Plan Total Prescribed Dose: 37.5 Gy
Reference Point Dosage Given to Date: 25 Gy
Reference Point Session Dosage Given: 2.5 Gy
Session Number: 10

## 2023-09-17 ENCOUNTER — Ambulatory Visit: Payer: Medicaid Other

## 2023-09-17 ENCOUNTER — Other Ambulatory Visit (HOSPITAL_COMMUNITY): Payer: Self-pay

## 2023-09-17 ENCOUNTER — Ambulatory Visit
Admission: RE | Admit: 2023-09-17 | Discharge: 2023-09-17 | Disposition: A | Discharge status: Home / Self Care | Payer: Medicaid Other | Source: Ambulatory Visit | Attending: Radiation Oncology | Admitting: Radiation Oncology

## 2023-09-17 ENCOUNTER — Other Ambulatory Visit: Payer: Self-pay

## 2023-09-17 DIAGNOSIS — Z510 Encounter for antineoplastic radiation therapy: Secondary | ICD-10-CM | POA: Diagnosis not present

## 2023-09-17 LAB — RAD ONC ARIA SESSION SUMMARY
Course Elapsed Days: 14
Plan Fractions Treated to Date: 11
Plan Prescribed Dose Per Fraction: 2.5 Gy
Plan Total Fractions Prescribed: 15
Plan Total Prescribed Dose: 37.5 Gy
Reference Point Dosage Given to Date: 27.5 Gy
Reference Point Session Dosage Given: 2.5 Gy
Session Number: 11

## 2023-09-18 ENCOUNTER — Inpatient Hospital Stay: Payer: Medicaid Other

## 2023-09-18 ENCOUNTER — Other Ambulatory Visit: Payer: Self-pay

## 2023-09-18 ENCOUNTER — Ambulatory Visit: Payer: Medicaid Other

## 2023-09-18 ENCOUNTER — Inpatient Hospital Stay (HOSPITAL_BASED_OUTPATIENT_CLINIC_OR_DEPARTMENT_OTHER): Admitting: Nurse Practitioner

## 2023-09-18 ENCOUNTER — Ambulatory Visit
Admission: RE | Admit: 2023-09-18 | Discharge: 2023-09-18 | Disposition: A | Discharge status: Home / Self Care | Payer: Medicaid Other | Source: Ambulatory Visit | Attending: Radiation Oncology

## 2023-09-18 ENCOUNTER — Other Ambulatory Visit: Payer: Medicaid Other

## 2023-09-18 ENCOUNTER — Other Ambulatory Visit: Payer: Self-pay | Admitting: Physician Assistant

## 2023-09-18 ENCOUNTER — Inpatient Hospital Stay: Payer: Medicaid Other | Admitting: Dietician

## 2023-09-18 ENCOUNTER — Inpatient Hospital Stay (HOSPITAL_BASED_OUTPATIENT_CLINIC_OR_DEPARTMENT_OTHER): Payer: Medicaid Other | Admitting: Oncology

## 2023-09-18 ENCOUNTER — Encounter: Payer: Self-pay | Admitting: Oncology

## 2023-09-18 ENCOUNTER — Ambulatory Visit: Payer: Medicaid Other | Admitting: Oncology

## 2023-09-18 VITALS — BP 119/88 | HR 63 | Temp 98.3°F | Resp 16 | Wt 151.7 lb

## 2023-09-18 DIAGNOSIS — T451X5A Adverse effect of antineoplastic and immunosuppressive drugs, initial encounter: Secondary | ICD-10-CM | POA: Insufficient documentation

## 2023-09-18 DIAGNOSIS — D701 Agranulocytosis secondary to cancer chemotherapy: Secondary | ICD-10-CM | POA: Diagnosis not present

## 2023-09-18 DIAGNOSIS — G893 Neoplasm related pain (acute) (chronic): Secondary | ICD-10-CM

## 2023-09-18 DIAGNOSIS — C160 Malignant neoplasm of cardia: Secondary | ICD-10-CM

## 2023-09-18 DIAGNOSIS — R530 Neoplastic (malignant) related fatigue: Secondary | ICD-10-CM | POA: Diagnosis not present

## 2023-09-18 DIAGNOSIS — R630 Anorexia: Secondary | ICD-10-CM

## 2023-09-18 DIAGNOSIS — D500 Iron deficiency anemia secondary to blood loss (chronic): Secondary | ICD-10-CM | POA: Diagnosis not present

## 2023-09-18 DIAGNOSIS — R634 Abnormal weight loss: Secondary | ICD-10-CM

## 2023-09-18 DIAGNOSIS — Z515 Encounter for palliative care: Secondary | ICD-10-CM

## 2023-09-18 DIAGNOSIS — Z95828 Presence of other vascular implants and grafts: Secondary | ICD-10-CM

## 2023-09-18 DIAGNOSIS — Z510 Encounter for antineoplastic radiation therapy: Secondary | ICD-10-CM | POA: Diagnosis not present

## 2023-09-18 LAB — CMP (CANCER CENTER ONLY)
ALT: 10 U/L (ref 0–44)
AST: 15 U/L (ref 15–41)
Albumin: 3.5 g/dL (ref 3.5–5.0)
Alkaline Phosphatase: 50 U/L (ref 38–126)
Anion gap: 4 — ABNORMAL LOW (ref 5–15)
BUN: 6 mg/dL (ref 6–20)
CO2: 30 mmol/L (ref 22–32)
Calcium: 8.8 mg/dL — ABNORMAL LOW (ref 8.9–10.3)
Chloride: 100 mmol/L (ref 98–111)
Creatinine: 0.5 mg/dL — ABNORMAL LOW (ref 0.61–1.24)
GFR, Estimated: >60 mL/min (ref >60)
Glucose, Bld: 101 mg/dL — ABNORMAL HIGH (ref 70–99)
Potassium: 3.8 mmol/L (ref 3.5–5.1)
Sodium: 134 mmol/L — ABNORMAL LOW (ref 135–145)
Total Bilirubin: 0.6 mg/dL (ref 0.0–1.2)
Total Protein: 5.9 g/dL — ABNORMAL LOW (ref 6.5–8.1)

## 2023-09-18 LAB — RAD ONC ARIA SESSION SUMMARY
Course Elapsed Days: 15
Plan Fractions Treated to Date: 12
Plan Prescribed Dose Per Fraction: 2.5 Gy
Plan Total Fractions Prescribed: 15
Plan Total Prescribed Dose: 37.5 Gy
Reference Point Dosage Given to Date: 30 Gy
Reference Point Session Dosage Given: 2.5 Gy
Session Number: 12

## 2023-09-18 LAB — CBC WITH DIFFERENTIAL (CANCER CENTER ONLY)
Abs Immature Granulocytes: 0 10*3/uL (ref 0.00–0.07)
Basophils Absolute: 0 10*3/uL (ref 0.0–0.1)
Basophils Relative: 1 %
Eosinophils Absolute: 0.2 10*3/uL (ref 0.0–0.5)
Eosinophils Relative: 9 %
HCT: 31.3 % — ABNORMAL LOW (ref 39.0–52.0)
Hemoglobin: 9.9 g/dL — ABNORMAL LOW (ref 13.0–17.0)
Immature Granulocytes: 0 %
Lymphocytes Relative: 7 %
Lymphs Abs: 0.2 10*3/uL — ABNORMAL LOW (ref 0.7–4.0)
MCH: 23.5 pg — ABNORMAL LOW (ref 26.0–34.0)
MCHC: 31.6 g/dL (ref 30.0–36.0)
MCV: 74.3 fL — ABNORMAL LOW (ref 80.0–100.0)
Monocytes Absolute: 0.5 10*3/uL (ref 0.1–1.0)
Monocytes Relative: 19 %
Neutro Abs: 1.6 10*3/uL — ABNORMAL LOW (ref 1.7–7.7)
Neutrophils Relative %: 64 %
Platelet Count: 119 10*3/uL — ABNORMAL LOW (ref 150–400)
RBC: 4.21 MIL/uL — ABNORMAL LOW (ref 4.22–5.81)
RDW: 20.8 % — ABNORMAL HIGH (ref 11.5–15.5)
WBC Count: 2.5 10*3/uL — ABNORMAL LOW (ref 4.0–10.5)
nRBC: 0 % (ref 0.0–0.2)

## 2023-09-18 MED ORDER — SODIUM CHLORIDE 0.9 % IV SOLN
4300.0000 mg | INTRAVENOUS | Status: DC
  Filled 2023-09-18: qty 86

## 2023-09-18 MED ORDER — DEXTROSE 5 % IV SOLN: INTRAVENOUS | Status: DC

## 2023-09-18 MED ORDER — PALONOSETRON HCL INJECTION 0.25 MG/5ML
0.2500 mg | Freq: Once | INTRAVENOUS | Status: AC
  Administered 2023-09-18: 0.25 mg via INTRAVENOUS
  Filled 2023-09-18: qty 5

## 2023-09-18 MED ORDER — OXALIPLATIN CHEMO INJECTION 100 MG/20ML
68.0000 mg/m2 | Freq: Once | INTRAVENOUS | Status: AC
Start: 2023-09-18 — End: 2023-09-18
  Administered 2023-09-18: 130 mg via INTRAVENOUS
  Filled 2023-09-18: qty 6

## 2023-09-18 MED ORDER — TRAZODONE HCL 50 MG PO TABS: 50.0000 mg | ORAL_TABLET | Freq: Every day | ORAL | 0 refills | Status: DC

## 2023-09-18 MED ORDER — LEUCOVORIN CALCIUM INJECTION 350 MG
400.0000 mg/m2 | Freq: Once | INTRAVENOUS | Status: AC
  Administered 2023-09-18: 768 mg via INTRAVENOUS
  Filled 2023-09-18: qty 17.5

## 2023-09-18 MED ORDER — SODIUM CHLORIDE 0.9 % IV SOLN
240.0000 mg | Freq: Once | INTRAVENOUS | Status: AC
  Administered 2023-09-18: 240 mg via INTRAVENOUS
  Filled 2023-09-18: qty 24

## 2023-09-18 MED ORDER — SODIUM CHLORIDE 0.9% FLUSH: 10.0000 mL | INTRAVENOUS | Status: DC | PRN

## 2023-09-18 MED ORDER — DEXAMETHASONE SODIUM PHOSPHATE 10 MG/ML IJ SOLN
10.0000 mg | Freq: Once | INTRAMUSCULAR | Status: AC
  Administered 2023-09-18: 10 mg via INTRAVENOUS
  Filled 2023-09-18: qty 1

## 2023-09-18 MED ORDER — HEPARIN SOD (PORK) LOCK FLUSH 100 UNIT/ML IV SOLN: 500.0000 [IU] | Freq: Once | INTRAVENOUS | Status: DC | PRN

## 2023-09-18 MED ORDER — SODIUM CHLORIDE 0.9% FLUSH
10.0000 mL | Freq: Once | INTRAVENOUS | Status: AC
Start: 2023-09-18 — End: 2023-09-18
  Administered 2023-09-18: 10 mL

## 2023-09-18 NOTE — Assessment & Plan Note (Addendum)
-  Please review HPI/oncology history for additional details and timeline of events.  -Reviewed staging PET/CT findings with the patient previously.  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  Discussed treatment options. All treatment options are palliative in nature and not curative intent. Plan made to proceed with palliative systemic treatment using FOLFOX plus nivolumab.   -He started palliative systemic treatments with FOLFOX plus nivolumab from 07/23/2023.  He was admitted to the hospital on 07/30/2023 after he presented with chest and epigastric pain, episodes of melena. He was found to have hemoglobin of 6.8. He was admitted for blood transfusion and further evaluation.  GI deferred additional workup/EGD since the cause of blood loss seemed obvious from malignancy. He was discharged home on 08/02/2023.   -He resumed systemic treatments with cycle 2 of FOLFOX plus nivolumab on 08/07/2023 without 5-FU bolus and a 20% dose reduction of oxaliplatin and tolerated the chemotherapy and immunotherapy reasonably well.  Did not experience major side effects.  Recent lower extremity ultrasound to rule out DVT showed possible inguinal lymphadenopathy.  Hence PET/CT was obtained for further evaluation on 09/10/2023.  Results are pending.  I reviewed the images.  Overall FDG activity has significantly improved in the esophagus, pancreas, peritoneal implant and abdominal lymphadenopathy.  I did not see evidence of FDG activity in the inguinal lymph nodes.  Will wait for the final read.  I reviewed images with the patient and his family member today.  Overall PET scan showing encouraging results.  Hence plan is to continue current management with dose reduced FOLFOX and nivolumab.  -His hemoglobin is stable at 9.9 today.  He does have leukopenia with white count of 2500, ANC 1600.  Platelet count 119,000.  Progressive cytopenias  are likely from chemotherapy.  We can still proceed with chemotherapy since ANC is more than 1000.  He is due for cycle 5 of chemoimmunotherapy today.  He will continue to receive oxaliplatin at 68 mg/m, 20% dose reduction.  We will hold 5-FU bolus today.    RTC in 2 weeks for cycle 6 of chemoimmunotherapy.  We will attempt to increase oxaliplatin dose to 85 mg/m, if no dose-limiting toxicities are noted.  Following cycle 6, plan is to transition him to maintenance treatments with 5-FU and nivolumab.  This will be continued every 2 weeks.  Only if intolerable side effects are noted to chemotherapy, then we will transition him to maintenance treatment with immunotherapy alone.

## 2023-09-18 NOTE — Progress Notes (Signed)
 Ok to reduce 5-FU dose to 4300mg  today based on pt's current BSA=1.79 per Dr. Arlana Pouch.  Drusilla Kanner, PharmD, [facility]

## 2023-09-18 NOTE — Assessment & Plan Note (Addendum)
-  Continue to monitor CBCD with each visit and transfuse as needed to maintain hemoglobin closer to 8.  Currently hemoglobin is stable at 9.9.

## 2023-09-18 NOTE — Assessment & Plan Note (Addendum)
-   Continue aggressive pain management.  He is currently on Dilaudid and methadone.  We greatly appreciate palliative care team's assistance in his management.  -He is also getting palliative radiation to help with abdominal pain.  He is scheduled to complete radiation on 09/23/2023.

## 2023-09-18 NOTE — Progress Notes (Signed)
 Nutrition Follow-up:  Patient with metastatic adenocarcinoma of GE junction. He is receiving palliative Folfox + Nivolumab q14d. Patient is under the care of Dr. Arlana Pouch.   1/28-1/31 hospital admit - UGI; acute/chronic blood loss anemia  12/13-1/9 hospital admit - FTT -S/p open Jtube 12/17 (DME: [facility])  Met with pt in infusion. He is sleeping at visit. Pt agreeable to completing follow-up, however pt continued to nod off and is a poor historian today. Pt reports decreased appetite lately. Not sure why. He denies taste changes, dysphagia, nausea although reports feeling a little nauseas last night. He did not vomit or require antiemetics. Pt does not recall eating anything yesterday. Pt has not been using Jtube for overnight feeds as ability/desire to eat orally improved. Pt reports constipation. Last BM was a couple days ago.    Medications: reviewed   Labs: Na 134, glucose 101, Cr 0.50  Anthropometrics: Wt 151 lb 11.2 oz today decreased 8% in 2 weeks - this is severe  3/4 - 165 lb 12.8 oz  2/19 - 172 lb 9.6 oz   Estimated Energy Needs  Kcals: 2350-2750 Protein: 115-130 Fluid: >2.3 L  NUTRITION DIAGNOSIS: Inadequate oral intake continues    MALNUTRITION DIAGNOSIS: Moderate malnutrition continues. Pt is at increased risk for worsening nutritional status given decreased appetite and wt loss   INTERVENTION:  Reviewed importance of meeting nutrition/hydration needs to maintain weights/strength Encourage small frequent meals/snacks high in calorie and protein Encourage resuming overnight jejunal feeds as tolerated - he will think about it    MONITORING, EVALUATION, GOAL: wt trends, intake, TF   NEXT VISIT: Wednesday April 2 during infusion

## 2023-09-18 NOTE — Patient Instructions (Signed)
 [facility] - A DEPT OF [facility]  Discharge Instructions: Thank you for choosing [facility] to provide your oncology and hematology care.   If you have a lab appointment with the Cancer Center, please go directly to the Cancer Center and check in at the registration area.   Wear comfortable clothing and clothing appropriate for easy access to any Portacath or PICC line.   We strive to give you quality time with your provider. You may need to reschedule your appointment if you arrive late (15 or more minutes).  Arriving late affects you and other patients whose appointments are after yours.  Also, if you miss three or more appointments without notifying the office, you may be dismissed from the clinic at the provider's discretion.      For prescription refill requests, have your pharmacy contact our office and allow 72 hours for refills to be completed.    Today you received the following chemotherapy and/or immunotherapy agents:  Opdivo, Oxaliplatin, Leucovorin, Fluorouracil      To help prevent nausea and vomiting after your treatment, we encourage you to take your nausea medication as directed.  BELOW ARE SYMPTOMS THAT SHOULD BE REPORTED IMMEDIATELY: *FEVER GREATER THAN 100.4 F (38 C) OR HIGHER *CHILLS OR SWEATING *NAUSEA AND VOMITING THAT IS NOT CONTROLLED WITH YOUR NAUSEA MEDICATION *UNUSUAL SHORTNESS OF BREATH *UNUSUAL BRUISING OR BLEEDING *URINARY PROBLEMS (pain or burning when urinating, or frequent urination) *BOWEL PROBLEMS (unusual diarrhea, constipation, pain near the anus) TENDERNESS IN MOUTH AND THROAT WITH OR WITHOUT PRESENCE OF ULCERS (sore throat, sores in mouth, or a toothache) UNUSUAL RASH, SWELLING OR PAIN  UNUSUAL VAGINAL DISCHARGE OR ITCHING   Items with * indicate a potential emergency and should be followed up as soon as possible or go to the Emergency Department if any problems should occur.  Please show the  CHEMOTHERAPY ALERT CARD or IMMUNOTHERAPY ALERT CARD at check-in to the Emergency Department and triage nurse.  Should you have questions after your visit or need to cancel or reschedule your appointment, please contact [facility]  Dept: [phone]  and follow the prompts.  Office hours are 8:00 a.m. to 4:30 p.m. Monday - Friday. Please note that voicemails left after 4:00 p.m. may not be returned until the following business day.  We are closed weekends and major holidays. You have access to a nurse at all times for urgent questions. Please call the main number to the clinic Dept: [phone] and follow the prompts.   For any non-urgent questions, you may also contact your provider using MyChart. We now offer e-Visits for anyone 76 and older to request care online for non-urgent symptoms. For details visit [email or web address].   Also download the MyChart app! Go to the app store, search "MyChart", open the app, select [place], and log in with your MyChart username and password.

## 2023-09-18 NOTE — Progress Notes (Signed)
 Patient seen by Dr. Archie Patten Pasam today  Vitals are within treatment parameters:Yes   Labs are within treatment parameters: No (Please specify and give further instructions.)   Has low WBC but okay to proceed with tx.  Treatment plan has been signed: Yes   Per physician team, Patient is ready for treatment and there are NO modifications to the treatment plan.

## 2023-09-18 NOTE — Progress Notes (Signed)
 Palliative Medicine [facility]  [phone] [phone]   Name: Jesus Haynes Date: 09/18/2023 MRN: [id number]  DOB: October 09, 1975  Patient Care Team: Ivonne Andrew, NP as PCP - General (Pulmonary Disease) Meryl Crutch, MD as Consulting Physician (Oncology) Vida Rigger, MD as Consulting Physician (Gastroenterology) Pickenpack-Cousar, Arty Baumgartner, NP as Nurse Practitioner (Hospice and Palliative Medicine)    INTERVAL HISTORY: Jesus Haynes is a 48 y.o. 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 Palliative ask to see for symptom management and goals of care.   SOCIAL HISTORY:     reports that he quit smoking about 10 years ago. His smoking use included cigarettes. He has never used smokeless tobacco. He reports that he does not drink alcohol and does not use drugs.  ADVANCE DIRECTIVES:  Advanced directives on file naming Jesus Haynes as the primary and Jesus Haynes as the secondary decision makers should Jesus Haynes become unable to make decisions for himself.   CODE STATUS: Full code  PAST MEDICAL HISTORY: Past Medical History:  Diagnosis Date   Asthma     ALLERGIES:  has no known allergies.  MEDICATIONS:  Current Outpatient Medications  Medication Sig Dispense Refill   albuterol (PROVENTIL) (2.5 MG/3ML) 0.083% nebulizer solution Take 3 mLs (2.5 mg total) by nebulization every 6 (six) hours as needed for wheezing or shortness of breath. 75 mL 12   albuterol (VENTOLIN HFA) 108 (90 Base) MCG/ACT inhaler Inhale 1-2 puffs into the lungs every 4 (four) hours as needed for wheezing or shortness of breath. 18 g 3   Albuterol Sulfate 2.5 MG/0.5ML NEBU Use 1 vial in nebulizer as directed every 6 hours as needed for wheezing or shortness of breath 30 mL 3   cyanocobalamin 1000 MCG tablet Take 1 tablet (1,000 mcg  total) by mouth daily. 30 tablet 2   feeding supplement (ENSURE ENLIVE / ENSURE PLUS) LIQD Take as directed (Patient not taking: Reported on 09/18/2023) 948 mL 12   folic acid (FOLVITE) 1 MG tablet Take 1 tablet (1 mg total) by mouth daily. 30 tablet 2   furosemide (LASIX) 20 MG tablet Take 1 tablet (20 mg total) by mouth daily as needed for fluid or edema. 30 tablet 3   HYDROmorphone (DILAUDID) 2 MG tablet Take 1 tablet (2 mg total) by mouth every 3 (three) hours as needed for severe pain (pain score 7-10). 90 tablet 0   lidocaine-prilocaine (EMLA) cream Apply to affected area once 30 g 3   methadone (DOLOPHINE) 5 MG tablet Take 1 tablet (5 mg total) by mouth every 12 (twelve) hours. 60 tablet 0   metoCLOPramide (REGLAN) 10 MG tablet Take 1 tablet (10 mg total) by mouth 4 (four) times daily  before meals and at bedtime. 120 tablet 3   Nutritional Supplements (FEEDING SUPPLEMENT, OSMOLITE 1.5 CAL,) LIQD At 60 mL/h over 18 hours on a daily basis. 30000 mL 0   ondansetron (ZOFRAN) 8 MG tablet Take 1 tablet (8 mg total) by mouth every 8 (eight) hours as needed for nausea or vomiting. Start on the third day after chemotherapy. (Patient not taking: Reported on 09/18/2023) 30 tablet 1   pantoprazole (PROTONIX) 40 MG tablet Take 1 tablet (40 mg total) by mouth 2 (two) times daily. 60 tablet 1   polyethylene glycol powder (GLYCOLAX/MIRALAX) 17 GM/SCOOP powder Dissolve 17 g in 4 oz of liquid and take by mouth  daily. (Patient not taking: Reported on 09/18/2023) 476 g 0   potassium chloride (KLOR-CON) 20 MEQ packet MIx and drink 20 mEq (1 packet) by mouth daily. 30 packet 3   prochlorperazine (COMPAZINE) 10 MG tablet Take 1 tablet (10 mg total) by mouth every 6 (six) hours as needed for nausea or vomiting. (Patient not taking: Reported on 09/18/2023) 30 tablet 1   senna-docusate (SENOKOT-S) 8.6-50 MG tablet Take 2 tablets by mouth at bedtime. 60 tablet 2   sucralfate (CARAFATE) 1 GM/10ML suspension Take 10 mLs (1 g  total) by mouth 4 (four) times daily -  with meals and at bedtime. 1200 mL 2   traZODone (DESYREL) 50 MG tablet Take 1 tablet (50 mg total) by mouth at bedtime. 15 tablet 0   Water For Irrigation, Sterile (FREE WATER) SOLN Place 100 mLs into feeding tube 4 (four) times daily. 12000 mL 1   No current facility-administered medications for this visit.   Facility-Administered Medications Ordered in Other Visits  Medication Dose Route Frequency Provider Last Rate Last Admin   dextrose 5 % solution   Intravenous Continuous Pasam, Avinash, MD 10 mL/hr at 09/18/23 1233 New Bag at 09/18/23 1233   fluorouracil (ADRUCIL) 4,300 mg in sodium chloride 0.9 % 64 mL chemo infusion  4,300 mg Intravenous 1 day or 1 dose Pasam, Avinash, MD       heparin lock flush 100 unit/mL  500 Units Intracatheter Once PRN Pasam, Avinash, MD       oxaliplatin (ELOXATIN) 130 mg in dextrose 5 % 500 mL chemo infusion  68 mg/m2 (Treatment Plan Recorded) Intravenous Once Pasam, Avinash, MD 263 mL/hr at 09/18/23 1345 130 mg at 09/18/23 1345   sodium chloride flush (NS) 0.9 % injection 10 mL  10 mL Intracatheter PRN Pasam, Avinash, MD        VITAL SIGNS: There were no vitals taken for this visit. There were no vitals filed for this visit.  Estimated body mass index is 24.49 kg/m as calculated from the following:   Height as of 08/29/23: 5\' 6"  (1.676 m).   Weight as of an earlier encounter on 09/18/23: 151 lb 11.2 oz (68.8 kg).  Drugs of Abuse     Component Value Date/Time   LABOPIA POSITIVE (A) 07/23/2023 1036   COCAINSCRNUR NONE DETECTED 07/23/2023 1036   LABBENZ POSITIVE (A) 07/23/2023 1036   AMPHETMU NONE DETECTED 07/23/2023 1036   THCU NONE DETECTED 07/23/2023 1036   LABBARB NONE DETECTED 07/23/2023 1036     PERFORMANCE STATUS (ECOG) : 1 - Symptomatic but completely ambulatory   Physical Exam General: NAD Cardiovascular: regular rate and rhythm Pulmonary: normal breathing pattern Extremities: no edema, no joint  deformities Skin: no rashes Neurological: AAO x3  IMPRESSION: I saw Jesus Haynes during his infusion. No acute distress noted. States he is doing well overall. Taking things one day at a time. Denies concerns with nausea, vomiting, constipation, or diarrhea. Constipation controlled with stool softeners as needed. Ongoing fatigue however tries to be active as possible. Feels he is tolerating radiation. Education provided on radiation induced fatigue.    He reports sleeping continues to be a challenge however feels some improvement when taking Trazadone. Encouraged to continue use. Can consider dose adjustment as needed.   Harm reports his pain is well managed on current regimen. He is currently using Dilaudid as needed. Methadone three times daily. No adjustments to regimen at this time. We will continue to closely monitor.   He has noticed some decrease in  his appetite. States he needs to do better with diet. Current weight is down to 151lbs from 160lbs. He is actively being followed by Dietician.   All questions answered and support provided.  Assessment and Plan  Fatigue Fatigue is likely related to the underlying illness and radiation. Discussed ways to cope.   Decreased appetite Loss of appetite and reduced food intake may contribute to weight loss. States awareness with plans to adjust nutritional intake. He is followed by Dietician.  - Encourage increased intake of protein drinks to maintain nutritional status.  Cancer Related Pain Reports pain is well controlled with regimen. No adjustments at this time.  -Hydromorphone as needed for breakthrough pain.  -Methadone three times daily  Follow-up I will plan to see patient back in 3-4weeks. Sooner if needed.  Patient expressed understanding and was in agreement with this plan. He also understands that He can call the clinic at any time with any questions, concerns, or complaints.   Any controlled substances utilized were prescribed in  the context of palliative care. PDMP has been reviewed.   Visit consisted of counseling and education dealing with the complex and emotionally intense issues of symptom management and palliative care in the setting of serious and potentially life-threatening illness.  Willette Alma, AGPCNP-BC  Palliative Medicine Team/[facility]

## 2023-09-18 NOTE — Progress Notes (Signed)
 [facility]  ONCOLOGY CLINIC PROGRESS NOTE   Patient Care Team: Ivonne Andrew, NP as PCP - General (Pulmonary Disease) Meryl Crutch, MD as Consulting Physician (Oncology) Vida Rigger, MD as Consulting Physician (Gastroenterology) Pickenpack-Cousar, Arty Baumgartner, NP as Nurse Practitioner [facility] and Palliative Medicine)  PATIENT NAME: Jesus Haynes   MR#: [id number] DOB: [birth year]  Date of visit: 09/18/2023   ASSESSMENT & PLAN:   Jesus Haynes is a 48 y.o. pleasant gentleman with history of asthma, presented to the ED on 05/27/2023 with complaints of epigastric burning abdominal pain, nausea, retching.  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  Adenocarcinoma of gastroesophageal junction (HCC) -Please review HPI/oncology history for additional details and timeline of events.  -Reviewed staging PET/CT findings with the patient previously.  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  Discussed treatment options. All treatment options are palliative in nature and not curative intent. Plan made to proceed with palliative systemic treatment using FOLFOX plus nivolumab.   -He started palliative systemic treatments with FOLFOX plus nivolumab from 07/23/2023.  He was admitted to the hospital on 07/30/2023 after he presented with chest and epigastric pain, episodes of melena. He was found to have hemoglobin of 6.8. He was admitted for blood transfusion and further evaluation.  GI deferred additional workup/EGD since the cause of blood loss seemed obvious from malignancy. He was discharged home on 08/02/2023.   -He resumed systemic treatments with cycle 2 of FOLFOX plus nivolumab on 08/07/2023 without 5-FU bolus and a 20% dose reduction of  oxaliplatin and tolerated the chemotherapy and immunotherapy reasonably well.  Did not experience major side effects.  Recent lower extremity ultrasound to rule out DVT showed possible inguinal lymphadenopathy.  Hence PET/CT was obtained for further evaluation on 09/10/2023.  Results are pending.  I reviewed the images.  Overall FDG activity has significantly improved in the esophagus, pancreas, peritoneal implant and abdominal lymphadenopathy.  I did not see evidence of FDG activity in the inguinal lymph nodes.  Will wait for the final read.  I reviewed images with the patient and his family member today.  Overall PET scan showing encouraging results.  Hence plan is to continue current management with dose reduced FOLFOX and nivolumab.  -His hemoglobin is stable at 9.9 today.  He does have leukopenia with white count of 2500, ANC 1600.  Platelet count 119,000.  Progressive cytopenias are likely from chemotherapy.  We can still proceed with chemotherapy since ANC is more than 1000.  He is due for cycle 5 of chemoimmunotherapy today.  He will continue to receive oxaliplatin at 68 mg/m, 20% dose reduction.  We will hold 5-FU bolus today.    RTC in 2 weeks for cycle 6 of chemoimmunotherapy.  We will attempt to increase oxaliplatin dose to 85 mg/m, if no dose-limiting toxicities are noted.  Following cycle 6, plan is to transition him to maintenance treatments with 5-FU and nivolumab.  This will be continued every 2 weeks.  Only if intolerable side effects are noted to chemotherapy, then we will transition him to maintenance treatment with immunotherapy alone.   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  Currently hemoglobin is stable at 9.9.   Cancer associated  pain - Continue aggressive pain management.  He is currently on Dilaudid and methadone.  We greatly appreciate palliative care team's assistance in  his management.  -He is also getting palliative radiation to help with abdominal pain.  He is scheduled to complete radiation on 09/23/2023.  Leukopenia due to antineoplastic chemotherapy [facility]) Though he has leukopenia, ANC is above one thousand and hence we will proceed with systemic treatments as planned.  He is getting dose reduced treatment.     I reviewed lab results and outside records for this visit and discussed relevant results with the patient. Diagnosis, plan of care and treatment options were also discussed in detail with the patient. Opportunity provided to ask questions and answers provided to his apparent satisfaction. Provided instructions to call our clinic with any problems, questions or concerns prior to return visit. I recommended to continue follow-up with PCP and sub-specialists. He verbalized understanding and agreed with the plan.   NCCN guidelines have been consulted in the planning of this patient's care.  I spent a total of 40 minutes during this encounter with the patient including review of chart and various tests results, discussions about plan of care and coordination of care plan.   Meryl Crutch, MD  09/18/2023 12:50 PM  [facility] - A DEPT OF [facility] 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  INTERVAL HISTORY:    Discussed the use of AI scribe software for clinical note transcription with the patient, who gave verbal consent to proceed.   LONZIE Haynes is here today for repeat clinical assessment.    He reports significant fatigue, which is particularly pronounced in the days following  chemotherapy. The patient has also experienced substantial weight loss, which he attributes to a decreased appetite rather than difficulty swallowing or keeping food down. Despite having a feeding tube, the patient does not use it regularly, preferring to eat normally as much as possible. The patient's weight loss is also potentially influenced by the use of Lasix for swelling in the legs, which has improved. The patient's family member notes that the patient had been gaining weight in the years prior to the cancer diagnosis, suggesting that the patient may have been unwell for some time before the cancer was identified.  I have reviewed the past medical history, past surgical history, social history and family history with the patient and they are unchanged from previous note.  HISTORY OF PRESENT ILLNESS:   Oncology History  Adenocarcinoma of gastroesophageal junction (HCC)  05/29/2023 Initial Diagnosis   Adenocarcinoma of gastroesophageal junction (HCC)   07/18/2023 Cancer Staging   Staging form: Esophagus - Adenocarcinoma, AJCC 8th Edition - Clinical: Stage IVB (cT4, cN3, cM1, G3) - Signed by Meryl Crutch, MD on 07/18/2023 Histologic grading system: 3 grade system   07/23/2023 -  Chemotherapy   Patient is on Treatment Plan : GASTROESOPHAGEAL FOLFOX + Nivolumab q14d       48 y.o. gentleman with history of asthma, presented to the ED on 05/27/2023 with complaints of epigastric burning abdominal pain, nausea, retching.  He also reported some dark tarry stools for 3 days prior to arrival.  He was previously seen in the ED  at [facility] on 05/08/2023 with complaints of chest or right upper quadrant abdominal pain.  Workup was unremarkable at that time and he was sent home with Protonix and Carafate.  Since his symptoms persisted, he presented to the ED again.   In the ED, his hemoglobin was noted to be down to 10, compared to 13 previously.  With concern for upper GI bleed, was admitted for  further evaluation and management.   Dr. Ewing Schlein performed upper GI endoscopy on 05/28/2023.  It showed partially obstructing, likely malignant esophageal tumor in the lower third of the esophagus.  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  Normal duodenum.  Pathology from GE junction mass came back positive for invasive adenocarcinoma, moderately to poorly differentiated.  Immunostains pending.   CT chest abdomen pelvis on 05/28/2023 showed abnormal wall thickening at the GE junction, compatible with patient's known carcinoma.  Prominent lymph nodes in the retrocrural region on the right side, adjacent to the hiatal hernia.  Abnormal low-density soft tissue in the gastrohepatic region, abutting the lesser curvature and proximal stomach, worrisome for metastatic disease.  Periportal, periceliac, gastrosplenic and left retroperitoneal lymphadenopathy, worrisome for metastatic involvement.  Hypodensities in the liver were also noted, worrisome for metastatic disease, largest 1 measuring 13 mm in the inferior right lobe.  Fat stranding surrounding the body and tail of pancreas, suggesting acute pancreatitis.   We were consulted during his hospitalization on 05/29/2023 for additional recommendations given new diagnosis of GE junction adenocarcinoma.   On 06/07/2023, staging PET/CT showed large hypermetabolic mass involving the distal esophagus and proximal stomach consistent with known primary esophageal carcinoma.  Multiple hypermetabolic retroperitoneal, lower mediastinal lymph nodes consistent with locally advanced disease.  Suspected direct tumor extension into the pancreatic body.  Hypermetabolic node or peritoneal implant within the omentum.  No other evidence of metastatic disease.   Plan was for palliative systemic treatments with FOLFOX plus nivolumab.  However patient's performance status continued to decline and he had to be hospitalized again on 06/14/2023.  He finally presented to reestablish  care in our clinic on 07/18/2023.  His performance status improved with nutrition via J-tube.  Started systemic treatments from 07/23/2023.  We did dose reduce oxaliplatin by 20% and proceeded with 68 mg/m dose.  When he presented for cycle 2 on 08/07/2023, leukopenia with white count of 3300, ANC 1100.  We started skipping 5-FU bolus and continue with the dose reduced oxaliplatin.    REVIEW OF SYSTEMS:   Review of Systems - Oncology  All other pertinent systems were reviewed with the patient and are negative.  ALLERGIES: He has no known allergies.  MEDICATIONS:  Current Outpatient Medications  Medication Sig Dispense Refill   albuterol (PROVENTIL) (2.5 MG/3ML) 0.083% nebulizer solution Take 3 mLs (2.5 mg total) by nebulization every 6 (six) hours as needed for wheezing or shortness of breath. 75 mL 12   albuterol (VENTOLIN HFA) 108 (90 Base) MCG/ACT inhaler Inhale 1-2 puffs into the lungs every 4 (four) hours as needed for wheezing or shortness of breath. 18 g 3   Albuterol Sulfate 2.5 MG/0.5ML NEBU Use 1 vial in nebulizer as directed every 6 hours as needed for wheezing or shortness of breath 30 mL 3   cyanocobalamin 1000 MCG tablet Take 1 tablet (1,000 mcg total) by mouth daily. 30 tablet 2   folic acid (FOLVITE) 1 MG tablet Take 1 tablet (1 mg total) by mouth daily. 30 tablet 2   furosemide (LASIX) 20 MG tablet Take 1  tablet (20 mg total) by mouth daily as needed for fluid or edema. 30 tablet 3   HYDROmorphone (DILAUDID) 2 MG tablet Take 1 tablet (2 mg total) by mouth every 3 (three) hours as needed for severe pain (pain score 7-10). 90 tablet 0   lidocaine-prilocaine (EMLA) cream Apply to affected area once 30 g 3   methadone (DOLOPHINE) 5 MG tablet Take 1 tablet (5 mg total) by mouth every 12 (twelve) hours. 60 tablet 0   metoCLOPramide (REGLAN) 10 MG tablet Take 1 tablet (10 mg total) by mouth 4 (four) times daily  before meals and at bedtime. 120 tablet 3   Nutritional Supplements  (FEEDING SUPPLEMENT, OSMOLITE 1.5 CAL,) LIQD At 60 mL/h over 18 hours on a daily basis. 30000 mL 0   pantoprazole (PROTONIX) 40 MG tablet Take 1 tablet (40 mg total) by mouth 2 (two) times daily. 60 tablet 1   potassium chloride (KLOR-CON) 20 MEQ packet MIx and drink 20 mEq (1 packet) by mouth daily. 30 packet 3   senna-docusate (SENOKOT-S) 8.6-50 MG tablet Take 2 tablets by mouth at bedtime. 60 tablet 2   sucralfate (CARAFATE) 1 GM/10ML suspension Take 10 mLs (1 g total) by mouth 4 (four) times daily -  with meals and at bedtime. 1200 mL 2   traZODone (DESYREL) 50 MG tablet Take 1 tablet (50 mg total) by mouth at bedtime. 15 tablet 0   Water For Irrigation, Sterile (FREE WATER) SOLN Place 100 mLs into feeding tube 4 (four) times daily. 12000 mL 1   feeding supplement (ENSURE ENLIVE / ENSURE PLUS) LIQD Take as directed (Patient not taking: Reported on 09/18/2023) 948 mL 12   ondansetron (ZOFRAN) 8 MG tablet Take 1 tablet (8 mg total) by mouth every 8 (eight) hours as needed for nausea or vomiting. Start on the third day after chemotherapy. (Patient not taking: Reported on 09/18/2023) 30 tablet 1   polyethylene glycol powder (GLYCOLAX/MIRALAX) 17 GM/SCOOP powder Dissolve 17 g in 4 oz of liquid and take by mouth daily. (Patient not taking: Reported on 09/18/2023) 476 g 0   prochlorperazine (COMPAZINE) 10 MG tablet Take 1 tablet (10 mg total) by mouth every 6 (six) hours as needed for nausea or vomiting. (Patient not taking: Reported on 09/18/2023) 30 tablet 1   No current facility-administered medications for this visit.   Facility-Administered Medications Ordered in Other Visits  Medication Dose Route Frequency Provider Last Rate Last Admin   dextrose 5 % solution   Intravenous Continuous Eyla Tallon, MD 10 mL/hr at 09/18/23 1233 New Bag at 09/18/23 1233   fluorouracil (ADRUCIL) 4,300 mg in sodium chloride 0.9 % 64 mL chemo infusion  4,300 mg Intravenous 1 day or 1 dose Zaiyden Strozier, MD        heparin lock flush 100 unit/mL  500 Units Intracatheter Once PRN Clifton Kovacic, MD       leucovorin 768 mg in dextrose 5 % 250 mL infusion  400 mg/m2 (Treatment Plan Recorded) Intravenous Once Corley Maffeo, MD       nivolumab (OPDIVO) 240 mg in sodium chloride 0.9 % 100 mL chemo infusion  240 mg Intravenous Once Laticia Vannostrand, MD       oxaliplatin (ELOXATIN) 130 mg in dextrose 5 % 500 mL chemo infusion  68 mg/m2 (Treatment Plan Recorded) Intravenous Once Johnte Portnoy, MD       sodium chloride flush (NS) 0.9 % injection 10 mL  10 mL Intracatheter PRN Everlena Mackley, MD  VITALS:   Blood pressure 119/88, pulse 63, temperature 98.3 F (36.8 C), temperature source Temporal, resp. rate 16, weight 151 lb 11.2 oz (68.8 kg), SpO2 97%.  Wt Readings from Last 3 Encounters:  09/18/23 151 lb 11.2 oz (68.8 kg)  09/10/23 160 lb (72.6 kg)  09/03/23 165 lb 12.8 oz (75.2 kg)    Body mass index is 24.49 kg/m.   Onc Performance Status - 09/18/23 1155       ECOG Perf Status   ECOG Perf Status Ambulatory and capable of all selfcare but unable to carry out any work activities.  Up and about more than 50% of waking hours      KPS SCALE   KPS % SCORE Cares for self, unable to carry on normal activity or to do active work               PHYSICAL EXAM:   Physical Exam Constitutional:      General: He is not in acute distress.    Appearance: Normal appearance.  HENT:     Head: Normocephalic and atraumatic.  Eyes:     General: No scleral icterus.    Conjunctiva/sclera: Conjunctivae normal.  Cardiovascular:     Rate and Rhythm: Normal rate and regular rhythm.     Heart sounds: Normal heart sounds.  Pulmonary:     Effort: Pulmonary effort is normal.     Breath sounds: Normal breath sounds.  Chest:     Comments: Port-A-Cath in place without signs of infection Abdominal:     General: There is no distension.     Comments: J-tube in place  Musculoskeletal:     Right lower leg:  Edema present.     Left lower leg: Edema present.  Lymphadenopathy:     Cervical: No cervical adenopathy.  Neurological:     General: No focal deficit present.     Mental Status: He is oriented to person, place, and time.  Psychiatric:        Mood and Affect: Mood normal.        Behavior: Behavior normal.      LABORATORY DATA:   I have reviewed the data as listed.  Results for orders placed or performed in visit on 09/18/23  CBC with Differential (Cancer Center Only)  Result Value Ref Range   WBC Count 2.5 (L) 4.0 - 10.5 K/uL   RBC 4.21 (L) 4.22 - 5.81 MIL/uL   Hemoglobin 9.9 (L) 13.0 - 17.0 g/dL   HCT 11.9 (L) 14.7 - 82.9 %   MCV 74.3 (L) 80.0 - 100.0 fL   MCH 23.5 (L) 26.0 - 34.0 pg   MCHC 31.6 30.0 - 36.0 g/dL   RDW 56.2 (H) 13.0 - 86.5 %   Platelet Count 119 (L) 150 - 400 K/uL   nRBC 0.0 0.0 - 0.2 %   Neutrophils Relative % 64 %   Neutro Abs 1.6 (L) 1.7 - 7.7 K/uL   Lymphocytes Relative 7 %   Lymphs Abs 0.2 (L) 0.7 - 4.0 K/uL   Monocytes Relative 19 %   Monocytes Absolute 0.5 0.1 - 1.0 K/uL   Eosinophils Relative 9 %   Eosinophils Absolute 0.2 0.0 - 0.5 K/uL   Basophils Relative 1 %   Basophils Absolute 0.0 0.0 - 0.1 K/uL   Immature Granulocytes 0 %   Abs Immature Granulocytes 0.00 0.00 - 0.07 K/uL  CMP (Cancer Center only)  Result Value Ref Range   Sodium 134 (L) 135 - 145 mmol/L  Potassium 3.8 3.5 - 5.1 mmol/L   Chloride 100 98 - 111 mmol/L   CO2 30 22 - 32 mmol/L   Glucose, Bld 101 (H) 70 - 99 mg/dL   BUN 6 6 - 20 mg/dL   Creatinine 0.45 (L) 4.09 - 1.24 mg/dL   Calcium 8.8 (L) 8.9 - 10.3 mg/dL   Total Protein 5.9 (L) 6.5 - 8.1 g/dL   Albumin 3.5 3.5 - 5.0 g/dL   AST 15 15 - 41 U/L   ALT 10 0 - 44 U/L   Alkaline Phosphatase 50 38 - 126 U/L   Total Bilirubin 0.6 0.0 - 1.2 mg/dL   GFR, Estimated >81 >19 mL/min   Anion gap 4 (L) 5 - 15  Results for orders placed or performed in visit on 09/18/23  Rad Onc Aria Session Summary  Result Value Ref Range    Course ID [id number]    Course Start Date 08/29/2023    Session Number 12    Course First Treatment Date 09/03/2023 12:37 PM    Course Last Treatment Date 09/18/2023 10:16 AM    Course Elapsed Days 15    Reference Point ID Esoph dp    Reference Point Dosage Given to Date 29.99999988 Gy   Reference Point Session Dosage Given 2.49999999 Gy   Plan ID Esoph    Plan Name simesophagus    Plan Fractions Treated to Date 12    Plan Total Fractions Prescribed 15    Plan Prescribed Dose Per Fraction 2.5 Gy   Plan Total Prescribed Dose 37.500000 Gy   Plan Primary Reference Point Esoph dp         RADIOGRAPHIC STUDIES:  I have personally reviewed the radiological images as listed and agree with the findings in the report.  VAS [place] LOWER EXTREMITY VENOUS (DVT) Result Date: 09/06/2023  Lower Venous DVT Study Patient Name:  LAVONTE PALOS  Date of Exam:   09/06/2023 Medical Rec #: [id number]       Accession #:    [id number] Date of Birth: 09-13-1975      Patient Gender: M Patient Age:   48 years Exam Location:  [facility] Procedure:      VAS [place] LOWER EXTREMITY VENOUS (DVT) Referring Phys: Santiago Glad --------------------------------------------------------------------------------  Indications: Edema.  Limitations: Poor ultrasound/tissue interface. Comparison Study: No previous exams Performing Technologist: Jody Hill RVT, RDMS  Examination Guidelines: A complete evaluation includes B-mode imaging, spectral Doppler, color Doppler, and power Doppler as needed of all accessible portions of each vessel. Bilateral testing is considered an integral part of a complete examination. Limited examinations for reoccurring indications may be performed as noted. The reflux portion of the exam is performed with the patient in reverse Trendelenburg.  +---------+---------------+---------+-----------+----------+-------------------+ RIGHT    CompressibilityPhasicitySpontaneityPropertiesThrombus Aging       +---------+---------------+---------+-----------+----------+-------------------+ CFV      Full           Yes      Yes                                      +---------+---------------+---------+-----------+----------+-------------------+ SFJ      Full                                                             +---------+---------------+---------+-----------+----------+-------------------+  FV Prox  Full           Yes      Yes                                      +---------+---------------+---------+-----------+----------+-------------------+ FV Mid   Full           Yes      Yes                                      +---------+---------------+---------+-----------+----------+-------------------+ FV DistalFull           Yes      Yes                                      +---------+---------------+---------+-----------+----------+-------------------+ PFV      Full                                                             +---------+---------------+---------+-----------+----------+-------------------+ POP      Full           Yes      Yes                                      +---------+---------------+---------+-----------+----------+-------------------+ PTV      Full                                         Not well visualized +---------+---------------+---------+-----------+----------+-------------------+ PERO     Full                                         Not well visualized +---------+---------------+---------+-----------+----------+-------------------+   +---------+---------------+---------+-----------+----------+-------------------+ LEFT     CompressibilityPhasicitySpontaneityPropertiesThrombus Aging      +---------+---------------+---------+-----------+----------+-------------------+ CFV      Full           Yes      Yes                                      +---------+---------------+---------+-----------+----------+-------------------+  SFJ      Full                                                             +---------+---------------+---------+-----------+----------+-------------------+ FV Prox  Full           Yes      Yes                                      +---------+---------------+---------+-----------+----------+-------------------+  FV Mid   Full           Yes      Yes                                      +---------+---------------+---------+-----------+----------+-------------------+ FV DistalFull           Yes      Yes                                      +---------+---------------+---------+-----------+----------+-------------------+ PFV      Full                                                             +---------+---------------+---------+-----------+----------+-------------------+ POP      Full           Yes      Yes                                      +---------+---------------+---------+-----------+----------+-------------------+ PTV      Full                                         Not well visualized +---------+---------------+---------+-----------+----------+-------------------+ PERO     Full                                         Not well visualized +---------+---------------+---------+-----------+----------+-------------------+     Summary: BILATERAL: - No evidence of deep vein thrombosis seen in the lower extremities, bilaterally. -No evidence of popliteal cyst, bilaterally. - Ultrasound characteristics of enlarged lymph nodes are noted in both groin groins. -Diffuse subcutaneous, bilaterally.   *See table(s) above for measurements and observations. Electronically signed by Gerarda Fraction on 09/06/2023 at 4:07:05 PM.    Final     CODE STATUS:  Code Status History     Date Active Date Inactive Code Status Order ID Comments User Context   06/14/2023 1621 07/11/2023 2225 Full Code [id number]  Bobette Mo, MD Inpatient   06/13/2023 1253 06/14/2023 0509 Full Code  [id number]  Richarda Overlie, MD HOV   05/27/2023 1516 05/30/2023 1651 Full Code [id number]  Maryln Gottron, MD ED    Questions for Most Recent Historical Code Status (Order [id number])     Question Answer   By: Consent: discussion documented in EHR            No orders of the defined types were placed in this encounter.    Future Appointments  Date Time Provider Department Center  09/18/2023  1:45 PM Noreene Larsson, RD [facility] None  09/19/2023 10:15 AM [facility] LINAC 4 [facility] None  09/19/2023 10:30 AM [facility] PALLIATIVE CARE [facility] None  09/20/2023  2:00 PM [facility] LINAC 4 [facility] None  09/20/2023  2:15 PM LINAC-MOODY [facility] None  09/20/2023  3:00 PM [facility] FLUSH [facility] None  09/23/2023  10:15 AM [facility] LINAC 4 [facility] None  10/02/2023 10:45 AM [facility] FLUSH [facility] None  10/02/2023 11:30 AM Eligh Rybacki, MD [facility] None  10/02/2023 12:30 PM [facility] INFUSION [facility] None  10/02/2023  1:45 PM Noreene Larsson, RD [facility] None  10/04/2023  2:30 PM [facility] FLUSH [facility] None     This document was completed utilizing speech recognition software. Grammatical errors, random word insertions, pronoun errors, and incomplete sentences are an occasional consequence of this system due to software limitations, ambient noise, and hardware issues. Any formal questions or concerns about the content, text or information contained within the body of this dictation should be directly addressed to the provider for clarification.

## 2023-09-18 NOTE — Assessment & Plan Note (Signed)
 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  He is getting dose reduced treatment.

## 2023-09-18 NOTE — Progress Notes (Signed)
 Orders placed for UA per [facility]. Orders later cancelled when pt revealed that his lasix dose had recently been increased. Increased urinary frequency attributed to increased lasix dose. Pt reports no pain or hematuria.

## 2023-09-19 ENCOUNTER — Ambulatory Visit: Payer: Medicaid Other

## 2023-09-19 ENCOUNTER — Encounter (HOSPITAL_COMMUNITY): Payer: Medicaid Other

## 2023-09-19 ENCOUNTER — Ambulatory Visit
Admission: RE | Admit: 2023-09-19 | Discharge: 2023-09-19 | Disposition: A | Discharge status: Home / Self Care | Payer: Medicaid Other | Source: Ambulatory Visit | Attending: Radiation Oncology | Admitting: Radiation Oncology

## 2023-09-19 ENCOUNTER — Other Ambulatory Visit: Payer: Self-pay

## 2023-09-19 ENCOUNTER — Inpatient Hospital Stay

## 2023-09-19 DIAGNOSIS — Z510 Encounter for antineoplastic radiation therapy: Secondary | ICD-10-CM | POA: Diagnosis not present

## 2023-09-19 LAB — RAD ONC ARIA SESSION SUMMARY
Course Elapsed Days: 16
Plan Fractions Treated to Date: 13
Plan Prescribed Dose Per Fraction: 2.5 Gy
Plan Total Fractions Prescribed: 15
Plan Total Prescribed Dose: 37.5 Gy
Reference Point Dosage Given to Date: 32.5 Gy
Reference Point Session Dosage Given: 2.5 Gy
Session Number: 13

## 2023-09-20 ENCOUNTER — Ambulatory Visit: Payer: Medicaid Other

## 2023-09-20 ENCOUNTER — Other Ambulatory Visit: Payer: Self-pay

## 2023-09-20 ENCOUNTER — Ambulatory Visit
Admission: RE | Admit: 2023-09-20 | Discharge: 2023-09-20 | Disposition: A | Discharge status: Home / Self Care | Source: Ambulatory Visit | Attending: Radiation Oncology | Admitting: Radiation Oncology

## 2023-09-20 ENCOUNTER — Inpatient Hospital Stay: Payer: Medicaid Other

## 2023-09-20 ENCOUNTER — Other Ambulatory Visit (HOSPITAL_COMMUNITY): Payer: Medicaid Other

## 2023-09-20 ENCOUNTER — Ambulatory Visit
Admission: RE | Admit: 2023-09-20 | Discharge: 2023-09-20 | Disposition: A | Discharge status: Home / Self Care | Payer: Medicaid Other | Source: Ambulatory Visit | Attending: Radiation Oncology | Admitting: Radiation Oncology

## 2023-09-20 VITALS — BP 100/68 | HR 64 | Temp 98.5°F | Resp 17

## 2023-09-20 DIAGNOSIS — Z510 Encounter for antineoplastic radiation therapy: Secondary | ICD-10-CM | POA: Diagnosis not present

## 2023-09-20 DIAGNOSIS — Z95828 Presence of other vascular implants and grafts: Secondary | ICD-10-CM

## 2023-09-20 LAB — RAD ONC ARIA SESSION SUMMARY
Course Elapsed Days: 17
Plan Fractions Treated to Date: 14
Plan Prescribed Dose Per Fraction: 2.5 Gy
Plan Total Fractions Prescribed: 15
Plan Total Prescribed Dose: 37.5 Gy
Reference Point Dosage Given to Date: 35 Gy
Reference Point Session Dosage Given: 2.5 Gy
Session Number: 14

## 2023-09-20 MED ORDER — HEPARIN SOD (PORK) LOCK FLUSH 100 UNIT/ML IV SOLN
500.0000 [IU] | Freq: Once | INTRAVENOUS | Status: AC
Start: 2023-09-20 — End: 2023-09-20
  Administered 2023-09-20: 500 [IU]

## 2023-09-20 MED ORDER — SODIUM CHLORIDE 0.9% FLUSH
10.0000 mL | Freq: Once | INTRAVENOUS | Status: AC
  Administered 2023-09-20: 10 mL

## 2023-09-23 ENCOUNTER — Ambulatory Visit
Admission: RE | Admit: 2023-09-23 | Discharge: 2023-09-23 | Disposition: A | Discharge status: Home / Self Care | Payer: Medicaid Other | Source: Ambulatory Visit | Attending: Radiation Oncology

## 2023-09-23 ENCOUNTER — Other Ambulatory Visit: Payer: Self-pay

## 2023-09-23 ENCOUNTER — Ambulatory Visit: Payer: Medicaid Other

## 2023-09-23 DIAGNOSIS — Z510 Encounter for antineoplastic radiation therapy: Secondary | ICD-10-CM | POA: Diagnosis not present

## 2023-09-23 LAB — RAD ONC ARIA SESSION SUMMARY
Course Elapsed Days: 20
Plan Fractions Treated to Date: 15
Plan Prescribed Dose Per Fraction: 2.5 Gy
Plan Total Fractions Prescribed: 15
Plan Total Prescribed Dose: 37.5 Gy
Reference Point Dosage Given to Date: 37.5 Gy
Reference Point Session Dosage Given: 2.5 Gy
Session Number: 15

## 2023-09-24 NOTE — Radiation Completion Notes (Addendum)
  Radiation Oncology         [phone] ________________________________  Name: Jesus Haynes MRN: [id number]  Date of Service: 09/23/2023  DOB: Oct 08, 1975  End of Treatment Note   Diagnosis: Stage IV, adenocarcinoma of the GE junction   Intent: Palliative     ==========DELIVERED PLANS==========  First Treatment Date: 2023-09-03 Last Treatment Date: 2023-09-23   Plan Name: Esoph Site: Esophagus Technique: 3D Mode: Photon Dose Per Fraction: 2.5 Gy Prescribed Dose (Delivered / Prescribed): 37.5 Gy / 37.5 Gy Prescribed Fxs (Delivered / Prescribed): 15 / 15     ==========ON TREATMENT VISIT DATES========== 2023-09-06, 2023-09-13, 2023-09-20   See weekly On Treatment Notes in Epic for details in the Media tab (listed as Progress notes on the On Treatment Visit Dates listed above). The patient tolerated radiation without esophagitis or fatigue.   The patient will receive a call in about one month from the radiation oncology department. He will continue follow up with Dr. Arlana Pouch as well.      Osker Mason, PAC

## 2023-10-02 ENCOUNTER — Other Ambulatory Visit: Payer: Self-pay

## 2023-10-02 ENCOUNTER — Inpatient Hospital Stay: Admitting: Dietician

## 2023-10-02 ENCOUNTER — Encounter: Payer: Self-pay | Admitting: Oncology

## 2023-10-02 ENCOUNTER — Inpatient Hospital Stay: Payer: Medicaid Other | Attending: Internal Medicine

## 2023-10-02 ENCOUNTER — Other Ambulatory Visit: Payer: Medicaid Other

## 2023-10-02 ENCOUNTER — Ambulatory Visit: Payer: Medicaid Other | Admitting: Oncology

## 2023-10-02 ENCOUNTER — Telehealth: Payer: Self-pay | Admitting: Oncology

## 2023-10-02 ENCOUNTER — Encounter: Payer: Self-pay | Admitting: Nurse Practitioner

## 2023-10-02 ENCOUNTER — Other Ambulatory Visit (HOSPITAL_COMMUNITY): Payer: Self-pay

## 2023-10-02 ENCOUNTER — Ambulatory Visit: Payer: Medicaid Other

## 2023-10-02 ENCOUNTER — Inpatient Hospital Stay: Payer: Medicaid Other

## 2023-10-02 ENCOUNTER — Other Ambulatory Visit: Payer: Self-pay | Admitting: Oncology

## 2023-10-02 ENCOUNTER — Inpatient Hospital Stay (HOSPITAL_BASED_OUTPATIENT_CLINIC_OR_DEPARTMENT_OTHER): Admitting: Nurse Practitioner

## 2023-10-02 ENCOUNTER — Inpatient Hospital Stay (HOSPITAL_BASED_OUTPATIENT_CLINIC_OR_DEPARTMENT_OTHER): Payer: Medicaid Other | Admitting: Oncology

## 2023-10-02 VITALS — BP 89/59 | HR 67 | Temp 98.2°F | Resp 16 | Ht 66.0 in | Wt 138.9 lb

## 2023-10-02 DIAGNOSIS — J45909 Unspecified asthma, uncomplicated: Secondary | ICD-10-CM | POA: Diagnosis not present

## 2023-10-02 DIAGNOSIS — C160 Malignant neoplasm of cardia: Secondary | ICD-10-CM

## 2023-10-02 DIAGNOSIS — G893 Neoplasm related pain (acute) (chronic): Secondary | ICD-10-CM | POA: Diagnosis not present

## 2023-10-02 DIAGNOSIS — R530 Neoplastic (malignant) related fatigue: Secondary | ICD-10-CM

## 2023-10-02 DIAGNOSIS — R630 Anorexia: Secondary | ICD-10-CM | POA: Diagnosis not present

## 2023-10-02 DIAGNOSIS — Z515 Encounter for palliative care: Secondary | ICD-10-CM | POA: Diagnosis not present

## 2023-10-02 DIAGNOSIS — Z79899 Other long term (current) drug therapy: Secondary | ICD-10-CM | POA: Diagnosis not present

## 2023-10-02 DIAGNOSIS — D500 Iron deficiency anemia secondary to blood loss (chronic): Secondary | ICD-10-CM | POA: Diagnosis not present

## 2023-10-02 DIAGNOSIS — T451X5A Adverse effect of antineoplastic and immunosuppressive drugs, initial encounter: Secondary | ICD-10-CM

## 2023-10-02 DIAGNOSIS — R634 Abnormal weight loss: Secondary | ICD-10-CM

## 2023-10-02 DIAGNOSIS — Z95828 Presence of other vascular implants and grafts: Secondary | ICD-10-CM

## 2023-10-02 DIAGNOSIS — D701 Agranulocytosis secondary to cancer chemotherapy: Secondary | ICD-10-CM

## 2023-10-02 LAB — CMP (CANCER CENTER ONLY)
ALT: 12 U/L (ref 0–44)
AST: 14 U/L — ABNORMAL LOW (ref 15–41)
Albumin: 3.4 g/dL — ABNORMAL LOW (ref 3.5–5.0)
Alkaline Phosphatase: 50 U/L (ref 38–126)
Anion gap: 6 (ref 5–15)
BUN: 6 mg/dL (ref 6–20)
CO2: 28 mmol/L (ref 22–32)
Calcium: 8.7 mg/dL — ABNORMAL LOW (ref 8.9–10.3)
Chloride: 96 mmol/L — ABNORMAL LOW (ref 98–111)
Creatinine: 0.49 mg/dL — ABNORMAL LOW (ref 0.61–1.24)
GFR, Estimated: >60 mL/min (ref >60)
Glucose, Bld: 121 mg/dL — ABNORMAL HIGH (ref 70–99)
Potassium: 3.8 mmol/L (ref 3.5–5.1)
Sodium: 130 mmol/L — ABNORMAL LOW (ref 135–145)
Total Bilirubin: 0.6 mg/dL (ref 0.0–1.2)
Total Protein: 6 g/dL — ABNORMAL LOW (ref 6.5–8.1)

## 2023-10-02 LAB — CBC WITH DIFFERENTIAL (CANCER CENTER ONLY)
Abs Immature Granulocytes: 0 10*3/uL (ref 0.00–0.07)
Basophils Absolute: 0 10*3/uL (ref 0.0–0.1)
Basophils Relative: 1 %
Eosinophils Absolute: 0.1 10*3/uL (ref 0.0–0.5)
Eosinophils Relative: 5 %
HCT: 34.8 % — ABNORMAL LOW (ref 39.0–52.0)
Hemoglobin: 11.1 g/dL — ABNORMAL LOW (ref 13.0–17.0)
Immature Granulocytes: 0 %
Lymphocytes Relative: 20 %
Lymphs Abs: 0.4 10*3/uL — ABNORMAL LOW (ref 0.7–4.0)
MCH: 23.5 pg — ABNORMAL LOW (ref 26.0–34.0)
MCHC: 31.9 g/dL (ref 30.0–36.0)
MCV: 73.7 fL — ABNORMAL LOW (ref 80.0–100.0)
Monocytes Absolute: 0.6 10*3/uL (ref 0.1–1.0)
Monocytes Relative: 31 %
Neutro Abs: 0.9 10*3/uL — ABNORMAL LOW (ref 1.7–7.7)
Neutrophils Relative %: 43 %
Platelet Count: 155 10*3/uL (ref 150–400)
RBC: 4.72 MIL/uL (ref 4.22–5.81)
RDW: 21.3 % — ABNORMAL HIGH (ref 11.5–15.5)
WBC Count: 1.9 10*3/uL — ABNORMAL LOW (ref 4.0–10.5)
nRBC: 0 % (ref 0.0–0.2)

## 2023-10-02 LAB — TSH: TSH: 1.347 u[IU]/mL (ref 0.350–4.500)

## 2023-10-02 MED ORDER — PROCHLORPERAZINE MALEATE 10 MG PO TABS
10.0000 mg | ORAL_TABLET | Freq: Four times a day (QID) | ORAL | 1 refills | Status: DC | PRN
Start: 2023-10-02 — End: 2024-01-02
  Filled 2023-10-02: qty 30, 8d supply, fill #0
  Filled 2023-10-30: qty 30, 8d supply, fill #1

## 2023-10-02 MED ORDER — HYDROMORPHONE HCL 2 MG PO TABS
2.0000 mg | ORAL_TABLET | Freq: Four times a day (QID) | ORAL | 0 refills | Status: DC | PRN
Start: 2023-10-02 — End: 2024-01-15
  Filled 2023-10-02: qty 60, 15d supply, fill #0

## 2023-10-02 MED ORDER — SODIUM CHLORIDE 0.9 % IV SOLN: Freq: Once | INTRAVENOUS | Status: AC

## 2023-10-02 MED ORDER — DRONABINOL 5 MG PO CAPS
5.0000 mg | ORAL_CAPSULE | Freq: Two times a day (BID) | ORAL | 1 refills | Status: DC
  Filled 2023-10-02: qty 60, 30d supply, fill #0
  Filled 2023-10-30: qty 60, 30d supply, fill #1

## 2023-10-02 MED ORDER — METHADONE HCL 5 MG PO TABS
5.0000 mg | ORAL_TABLET | Freq: Every day | ORAL | 0 refills | Status: DC
Start: 2023-10-02 — End: 2023-10-30
  Filled 2023-10-02: qty 30, 30d supply, fill #0

## 2023-10-02 MED ORDER — SODIUM CHLORIDE 0.9% FLUSH
10.0000 mL | Freq: Once | INTRAVENOUS | Status: AC
  Administered 2023-10-02: 10 mL

## 2023-10-02 MED ORDER — ONDANSETRON 8 MG PO TBDP
8.0000 mg | ORAL_TABLET | Freq: Three times a day (TID) | ORAL | 3 refills | Status: DC | PRN
  Filled 2023-10-02: qty 30, 10d supply, fill #0
  Filled 2023-10-30: qty 30, 10d supply, fill #1
  Filled 2023-12-04: qty 30, 10d supply, fill #2
  Filled 2024-02-03: qty 30, 10d supply, fill #3

## 2023-10-02 MED ORDER — HEPARIN SOD (PORK) LOCK FLUSH 100 UNIT/ML IV SOLN
500.0000 [IU] | Freq: Once | INTRAVENOUS | Status: AC
  Administered 2023-10-02: 500 [IU]

## 2023-10-02 MED ORDER — TRAZODONE HCL 50 MG PO TABS
50.0000 mg | ORAL_TABLET | Freq: Every day | ORAL | 3 refills | Status: DC
  Filled 2023-10-02: qty 30, 30d supply, fill #0
  Filled 2023-10-30: qty 30, 30d supply, fill #1
  Filled 2023-11-20 – 2023-12-04 (×2): qty 30, 30d supply, fill #2

## 2023-10-02 NOTE — Assessment & Plan Note (Addendum)
 Currently on methadone and Dilaudid for pain management. Radiation therapy has helped with pain, and there is no significant disease causing pain currently. Pain medications help with sleep, but concern exists that methadone may affect appetite and mobility. - Consider reducing methadone dosage to improve appetite and mobility. - Continue Dilaudid for breakthrough pain management. - Discuss pain management plan with Lowella Bandy, who is seeing him today.

## 2023-10-02 NOTE — Progress Notes (Signed)
 Nutrition Follow-up:  Patient with metastatic adenocarcinoma of GE junction. He is receiving palliative Folfox + Nivolumab q14d. Patient is under the care of Dr. Arlana Pouch.   1/28-1/31 hospital admit - UGI; acute/chronic blood loss anemia  12/13-1/9 hospital admit - FTT -S/p open Jtube 12/17 (DME: [facility])  Met with pt in infusion. Caregiver Tresa Endo) present at visit. Pt drowsy at visit. Reports increased fatigue and no appetite. Says he has eaten very little in the last 2 weeks. Patient has been hesitant to use feeding tube. Pt reports frequent urination secondary to lasix requiring him to unhook to go to bathroom. Pt also reports sometimes feeling pressure in the stomach when feedings are running. He denies nausea, vomiting, diarrhea, constipation. Tresa Endo reports a couple episodes of bowl incontinence. Possibly related to radiation therapy completed 3/24.    Medications: reviewed   Labs: Na 130, glucose 121, Cr 0.49, albumin 3.4  Anthropometrics: Wt 138 lb 14.4 oz today decreased 9% in 2 weeks - severe  3/19 - 151 lb 11.2 oz 3/4 - 165 lb 12.8 oz 2/19 - 172 lb 9.6 oz   Weights have decreased 20% (34 lbs) in the last 6 weeks - severe  Revised Estimated Energy Needs (wt loss)  Kcals: 2195-2500 Protein: 103-113 Fluid: >/= 2L  NUTRITION DIAGNOSIS: Inadequate oral intake continues    MALNUTRITION DIAGNOSIS: Pt with worsening nutritional status. At this time, pt meeting criteria for severe malnutrition    INTERVENTION:  Pt agreeable to initiate jejunal feedings. Educated to start at 40 ml/hr x 18 hours. Increase by 10 ml/day to goal  Pt to be provided bedside urinal today  Consider appetite stimulant     MONITORING, EVALUATION, GOAL: wt trends, intake, TF   NEXT VISIT: To be scheduled with treatment

## 2023-10-02 NOTE — Assessment & Plan Note (Addendum)
 We are holding chemotherapy today for leukopenia but also for fatigue and other issues as mentioned above.

## 2023-10-02 NOTE — Telephone Encounter (Signed)
 Scheduled patient's appts. Advised patient to contact us if rescheduling is needed. Provided my direct line.

## 2023-10-02 NOTE — Progress Notes (Signed)
 Palliative Medicine [facility]  [phone] [phone]   Name: Jesus Haynes Date: 10/02/2023 MRN: [id number]  DOB: [birth year]  Patient Care Team: Ivonne Andrew, NP as PCP - General (Pulmonary Disease) Meryl Crutch, MD as Consulting Physician (Oncology) Vida Rigger, MD as Consulting Physician (Gastroenterology) Pickenpack-Cousar, Arty Baumgartner, NP as Nurse Practitioner (Hospice and Palliative Medicine)    INTERVAL HISTORY: Jesus Haynes is a 48 y.o. 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 Palliative ask to see for symptom management and goals of care.   SOCIAL HISTORY:     reports that he quit smoking about 10 years ago. His smoking use included cigarettes. He has never used smokeless tobacco. He reports that he does not drink alcohol and does not use drugs.  ADVANCE DIRECTIVES:  None on file   CODE STATUS: Full code  PAST MEDICAL HISTORY: Past Medical History:  Diagnosis Date   Asthma     ALLERGIES:  has no known allergies.  MEDICATIONS:  Current Outpatient Medications  Medication Sig Dispense Refill   albuterol (PROVENTIL) (2.5 MG/3ML) 0.083% nebulizer solution Take 3 mLs (2.5 mg total) by nebulization every 6 (six) hours as needed for wheezing or shortness of breath. 75 mL 12   albuterol (VENTOLIN HFA) 108 (90 Base) MCG/ACT inhaler Inhale 1-2 puffs into the lungs every 4 (four) hours as needed for wheezing or shortness of breath. 18 g 3   Albuterol Sulfate 2.5 MG/0.5ML NEBU Use 1 vial in nebulizer as directed every 6 hours as needed for wheezing or shortness of breath 30 mL 3   cyanocobalamin 1000 MCG tablet Take 1 tablet (1,000 mcg total) by mouth daily. 30 tablet 2   feeding supplement (ENSURE ENLIVE / ENSURE PLUS) LIQD Take as directed (Patient not taking: Reported on 10/02/2023) 948 mL 12    folic acid (FOLVITE) 1 MG tablet Take 1 tablet (1 mg total) by mouth daily. 30 tablet 2   furosemide (LASIX) 20 MG tablet Take 1 tablet (20 mg total) by mouth daily as needed for fluid or edema. 30 tablet 3   HYDROmorphone (DILAUDID) 2 MG tablet Take 1 tablet (2 mg total) by mouth every 6 (six) hours as needed for severe pain (pain score 7-10). 60 tablet 0   lidocaine-prilocaine (EMLA) cream Apply to affected area once 30 g 3   methadone (DOLOPHINE) 5 MG tablet Take 1 tablet (5 mg total) by mouth daily. 30 tablet 0   metoCLOPramide (REGLAN) 10 MG tablet Take 1 tablet (10 mg total) by mouth 4 (four) times daily  before meals and at bedtime. 120 tablet 3   Nutritional Supplements (FEEDING SUPPLEMENT, OSMOLITE 1.5 CAL,) LIQD At 60 mL/h over 18 hours on a daily basis. 30000 mL 0   ondansetron (ZOFRAN-ODT) 8 MG disintegrating tablet Dissolve 1 tablet (8 mg total) by mouth every 8 (eight) hours as needed for nausea or vomiting. 30 tablet 3   pantoprazole (PROTONIX) 40 MG tablet Take 1 tablet (40 mg total) by mouth 2 (two) times daily. 60 tablet 1   polyethylene glycol powder (GLYCOLAX/MIRALAX) 17 GM/SCOOP powder Dissolve 17 g in 4 oz of liquid and take by mouth daily. 476 g 0   potassium chloride (KLOR-CON) 20 MEQ packet MIx and drink 20 mEq (1 packet) by mouth daily. 30 packet 3   prochlorperazine (COMPAZINE) 10 MG tablet Take 1 tablet (10 mg total) by  mouth every 6 (six) hours as needed for nausea or vomiting. 30 tablet 1   senna-docusate (SENOKOT-S) 8.6-50 MG tablet Take 2 tablets by mouth at bedtime. 60 tablet 2   sucralfate (CARAFATE) 1 GM/10ML suspension Take 10 mLs (1 g total) by mouth 4 (four) times daily -  with meals and at bedtime. 1200 mL 2   traZODone (DESYREL) 50 MG tablet Take 1 tablet (50 mg total) by mouth at bedtime. 30 tablet 3   Water For Irrigation, Sterile (FREE WATER) SOLN Place 100 mLs into feeding tube 4 (four) times daily. 12000 mL 1   No current facility-administered medications  for this visit.    VITAL SIGNS: There were no vitals taken for this visit. There were no vitals filed for this visit.  Estimated body mass index is 22.42 kg/m as calculated from the following:   Height as of an earlier encounter on 10/02/23: 5\' 6"  (1.676 m).   Weight as of an earlier encounter on 10/02/23: 138 lb 14.4 oz (63 kg).  PERFORMANCE STATUS (ECOG) : 1 - Symptomatic but completely ambulatory  Physical Exam General: NAD Cardiovascular: regular rate and rhythm Pulmonary: normal breathing pattern Extremities: no edema, no joint deformities Skin: no rashes, PEG tube in place  Neurological: AAO x3  IMPRESSION: Discussed the use of AI scribe software for clinical note transcription with the patient, who gave verbal consent to proceed. History of Present Illness Jesus Haynes is a 48 year old male who was seen during his infusion with decrease appetite, fatigue, and pain management. Denies concerns with nausea, vomiting, constipation, or diarrhea. His family expresses concern for increase in fatigue and weakness which they attribute to his decreased appetite.  Reports he is sleeping much better at night with the use of trazodone.  There is a concern about his mobility and the need for a wheelchair to help him get outside more often. His family has been involved in arranging transportation and discussing the need for a wheelchair to improve his quality of life.  He expresses desire to go to the park and be outside with his family however this has been limited due to his fatigue and inability to ambulate long distances.  We will order a mobility wheelchair for support.   He is experiencing a decrease in his appetite. He does not like drinking protein shakes. Some days will eat only one meal or take several bites and then have no desire to eat. He is being closely followed by the Dietitian. Jesus Haynes's current weight is 138lb, down from 151 pounds on March 19, 165 pounds on March 4.  He is  currently not using a feeding tube and despite recommendations for use he expresses disinterest.  Education provided on the use of dronabinol for appetite stimulation.  I discussed efficacy, administration, and potential side effects.  Patient and family wish to try with hopes of increasing his weight along with his appetite.  Pain management has been a focus, with an indication that pain has improved since radiation treatment. He is on methadone, which he takes twice a day (decreased from 3 times daily on March 19), and Dilaudid every 4 hours as needed.  Given improvement in his pain we discussed adjusting his regimen to allow minimal use.  Education provided on new regimen which will include methadone once daily, patient expresses wishes to take at night.  He will also continue with hydromorphone extending out to every 6 hours as needed.  He has not required around-the-clock use of  his hydromorphone over the past month with last refill on March 4 lasting him almost a month.  We will continue to closely monitor and adjust regimen as tolerated.  I discussed the importance of continued conversation with family and their medical providers regarding overall plan of care and treatment options, ensuring decisions are within the context of the patients values and GOCs.  Assessment & Plan Anorexia Lack of appetite causing weakness and reduced mobility. Dietitian involved. Marinol considered to stimulate appetite. - Initiate Marinol (dronabinol) 5 mg, one capsule twice day. - Monitor for effectiveness and adjust dosage if necessary.  Mobility issues Weakness and lack of mobility exacerbated by anorexia. Wheelchair considered to improve mobility and quality of life. - Order a wheelchair through insurance. - Provide contact information for delivery confirmation.  Pain management Pain improved post-radiation. Currently on methadone and Dilaudid. Adjustments considered for optimal comfort. - Continue  methadone at bedtime only.  Recently tolerated decrease on 3/19 from every 8 hours to every 12 hours. - Adjust Dilaudid to every six hours as needed.  Not requiring around-the-clock.  Last prescription written was on March 4.   Follow-up Reassessment in two weeks to evaluate appetite stimulant effectiveness and pain management adjustments. - Schedule follow-up appointment in two weeks. - Ensure prescriptions are sent to pharmacy and approved.  Patient expressed understanding and was in agreement with this plan. He also understands that He can call the clinic at any time with any questions, concerns, or complaints.   Any controlled substances utilized were prescribed in the context of palliative care. PDMP has been reviewed.   Visit consisted of counseling and education dealing with the complex and emotionally intense issues of symptom management and palliative care in the setting of serious and potentially life-threatening illness.  Willette Alma, AGPCNP-BC  Palliative Medicine Team/[facility]

## 2023-10-02 NOTE — Progress Notes (Signed)
 [facility]  ONCOLOGY CLINIC PROGRESS NOTE   Patient Care Team: Ivonne Andrew, NP as PCP - General (Pulmonary Disease) Meryl Crutch, MD as Consulting Physician (Oncology) Vida Rigger, MD as Consulting Physician (Gastroenterology) Pickenpack-Cousar, Arty Baumgartner, NP as Nurse Practitioner [facility])  PATIENT NAME: Jesus Haynes   MR#: [id number] DOB: 1975/12/12  Date of visit: 10/02/2023   ASSESSMENT & PLAN:   Jesus Haynes is a 48 y.o. pleasant gentleman with history of asthma, presented to the ED on 05/27/2023 with complaints of epigastric burning abdominal pain, nausea, retching.  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  Adenocarcinoma of gastroesophageal junction (HCC) -Please review HPI/oncology history for additional details and timeline of events.  -Reviewed staging PET/CT findings with the patient previously.  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  Discussed treatment options. All treatment options are palliative in nature and not curative intent. Plan made to proceed with palliative systemic treatment using FOLFOX plus nivolumab.   -He started palliative systemic treatments with FOLFOX plus nivolumab from 07/23/2023.  He was admitted to the hospital on 07/30/2023 after he presented with chest and epigastric pain, episodes of melena. He was found to have hemoglobin of 6.8. He was admitted for blood transfusion and further evaluation.  GI deferred additional workup/EGD since the cause of blood loss seemed obvious from malignancy. He was discharged home on 08/02/2023.   -He resumed systemic treatments with cycle 2 of FOLFOX plus nivolumab on 08/07/2023 without 5-FU bolus and a 20% dose reduction of  oxaliplatin and tolerated the chemotherapy and immunotherapy reasonably well.  Did not experience major side effects.  Recent lower extremity ultrasound to rule out DVT showed possible inguinal lymphadenopathy.  Hence PET/CT was obtained for further evaluation on 09/10/2023.  It showed significant improvement with decreased size and metabolic some of the gastric mass.  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  Overall PET scan showing encouraging results.  Hence plan made to continue current management with dose reduced FOLFOX and nivolumab.  Last chemotherapy with FOLFOX and nivolumab was on 09/18/2023.  He presented to clinic today for possible cycle 6 of chemoimmunotherapy.  However over the last 2 weeks, he has not been eating very well and has had progressive decline in his performance status and ECOG is 3 today.  Hence we will have to hold chemotherapy today.  We will reassess in approximately 2 weeks.  He also has progressive leukopenia with white count of 1900 today, ANC 900.  Hopefully blood counts will improve with a break in treatment.  Hemoglobin is much better at 11.1.  Platelet count is normal at 155,000.  Creatinine 0.49, normal LFTs.  He was given 1 L of NS bolus today.  He was advised to continue adequate hydration and nutrition at home and focus on being mobile.  RTC in 2 weeks for possible resumption of chemoimmunotherapy.  He was made aware of my absence in clinic around that time and will be seen by one of our other providers.  If performance status and blood counts are low, plan to resume treatment at same dose.  Otherwise we can skip oxaliplatin and continue with 5-FU and nivolumab treatments.  Following cycle 6, plan is to transition him to maintenance treatments with 5-FU and nivolumab.  This will  be continued every 2 weeks.  Only if intolerable side effects are noted to chemotherapy, then we will transition him to maintenance treatment with immunotherapy  alone.   Leukopenia due to antineoplastic chemotherapy [facility]) We are holding chemotherapy today for leukopenia but also for fatigue and other issues as mentioned above.  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 Radiation therapy has helped with pain, and there is no significant disease causing pain currently. Pain medications help with sleep, but concern exists that methadone may affect appetite and mobility. - Consider reducing methadone dosage to improve appetite and mobility. - Continue Dilaudid for breakthrough pain management. - Discuss pain management plan with Lowella Bandy, who is seeing him today.      I reviewed lab results and outside records for this visit and discussed relevant results with the patient. Diagnosis, plan of care and treatment options were also discussed in detail with the patient. Opportunity provided to ask questions and answers provided to his apparent satisfaction. Provided instructions to call our clinic with any problems, questions or concerns prior to return visit. I recommended to continue follow-up with PCP and sub-specialists. He verbalized understanding and agreed with the plan.   NCCN guidelines have been consulted in the planning of this patient's care.  I spent a total of 40 minutes during this encounter with the patient including review of chart and various tests results, discussions about plan of care and coordination of care plan.   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  INTERVAL HISTORY:    Discussed the use of AI scribe software for clinical note transcription with the patient, who gave verbal consent to proceed.   Jesus Haynes is here today for repeat clinical assessment.    He has experienced a significant decrease in appetite and energy following his last round of chemotherapy two weeks ago. He feels 'ravaged' with hunger but is unable to eat more than a few bites before feeling full. This has led to a marked decrease in his activity level, as he is now mostly bedbound and unable to engage in activities he previously enjoyed, such as moving around the house and going up and down stairs.  He experiences nausea, particularly when eating, and has been taking nausea medication as needed. He is currently on tube feeds, which he refers to as his 'favorite tube feeds,' but he wants to eat normally. Despite these challenges, he has not been experiencing pain, which he attributes to the effectiveness of his radiation treatment.  Recent blood work shows a low white blood cell count of 1900, which has not rebounded since his last chemotherapy session. His hemoglobin is at 11.1, which is an improvement, and there is no indication of dehydration from his lab results, although his sodium and chloride levels are low. He has been drinking Gatorade but admits to not consuming large amounts.  He is currently  taking methadone and Dilaudid for pain management, with methadone potentially being reduced to improve his appetite and mobility. He notes that the pain medications help him sleep, which is important to him.  I have reviewed the past medical history, past surgical history, social history and family history with the patient and they are unchanged from previous note.  HISTORY OF PRESENT ILLNESS:   Oncology  History  Adenocarcinoma of gastroesophageal junction (HCC)  05/29/2023 Initial Diagnosis   Adenocarcinoma of gastroesophageal junction (HCC)   07/18/2023 Cancer Staging   Staging form: Esophagus - Adenocarcinoma, AJCC 8th Edition - Clinical: Stage IVB (cT4, cN3, cM1, G3) - Signed by Meryl Crutch, MD on 07/18/2023 Histologic grading system: 3 grade system   07/23/2023 -  Chemotherapy   Patient is on Treatment Plan : GASTROESOPHAGEAL FOLFOX + Nivolumab q14d       48 y.o. gentleman with history of asthma, presented to the ED on 05/27/2023 with complaints of epigastric burning abdominal pain, nausea, retching.  He also reported some dark tarry stools for 3 days prior to arrival.  He was previously seen in the ED at [facility] on 05/08/2023 with complaints of chest or right upper quadrant abdominal pain.  Workup was unremarkable at that time and he was sent home with Protonix and Carafate.  Since his symptoms persisted, he presented to the ED again.   In the ED, his hemoglobin was noted to be down to 10, compared to 13 previously.  With concern for upper GI bleed, was admitted for further evaluation and management.   Dr. Ewing Schlein performed upper GI endoscopy on 05/28/2023.  It showed partially obstructing, likely malignant esophageal tumor in the lower third of the esophagus.  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  Normal duodenum.  Pathology from GE junction mass came back positive for invasive adenocarcinoma, moderately to poorly differentiated.  Immunostains pending.   CT chest abdomen pelvis on 05/28/2023 showed abnormal wall thickening at the GE junction, compatible with patient's known carcinoma.  Prominent lymph nodes in the retrocrural region on the right side, adjacent to the hiatal hernia.  Abnormal low-density soft tissue in the gastrohepatic region, abutting the lesser curvature and proximal stomach, worrisome for metastatic disease.  Periportal, periceliac, gastrosplenic and left  retroperitoneal lymphadenopathy, worrisome for metastatic involvement.  Hypodensities in the liver were also noted, worrisome for metastatic disease, largest 1 measuring 13 mm in the inferior right lobe.  Fat stranding surrounding the body and tail of pancreas, suggesting acute pancreatitis.   We were consulted during his hospitalization on 05/29/2023 for additional recommendations given new diagnosis of GE junction adenocarcinoma.   On 06/07/2023, staging PET/CT showed large hypermetabolic mass involving the distal esophagus and proximal stomach consistent with known primary esophageal carcinoma.  Multiple hypermetabolic retroperitoneal, lower mediastinal lymph nodes consistent with locally advanced disease.  Suspected direct tumor extension into the pancreatic body.  Hypermetabolic node or peritoneal implant within the omentum.  No other evidence of metastatic disease.   Plan was for palliative systemic treatments with FOLFOX plus nivolumab.  However patient's performance status continued to decline and he had to be hospitalized again on 06/14/2023.  He finally presented to reestablish care in our clinic on 07/18/2023.  His performance status improved with nutrition via J-tube.  Started systemic treatments from 07/23/2023.  We did dose reduce oxaliplatin by 20% and proceeded with 68 mg/m dose.  When he presented for cycle 2 on 08/07/2023, leukopenia with white count of 3300, ANC 1100.  We started skipping 5-FU  bolus and continue with the dose reduced oxaliplatin.  Restaging PET scan on 09/10/2023 showed excellent response with significant reduction in size and metabolism in the gastric mass.  Resolution of peritoneal metastatic disease.    REVIEW OF SYSTEMS:   Review of Systems - Oncology  All other pertinent systems were reviewed with the patient and are negative.  ALLERGIES: He has no known allergies.  MEDICATIONS:  Current Outpatient Medications  Medication Sig Dispense Refill   albuterol  (PROVENTIL) (2.5 MG/3ML) 0.083% nebulizer solution Take 3 mLs (2.5 mg total) by nebulization every 6 (six) hours as needed for wheezing or shortness of breath. 75 mL 12   albuterol (VENTOLIN HFA) 108 (90 Base) MCG/ACT inhaler Inhale 1-2 puffs into the lungs every 4 (four) hours as needed for wheezing or shortness of breath. 18 g 3   Albuterol Sulfate 2.5 MG/0.5ML NEBU Use 1 vial in nebulizer as directed every 6 hours as needed for wheezing or shortness of breath 30 mL 3   cyanocobalamin 1000 MCG tablet Take 1 tablet (1,000 mcg total) by mouth daily. 30 tablet 2   folic acid (FOLVITE) 1 MG tablet Take 1 tablet (1 mg total) by mouth daily. 30 tablet 2   furosemide (LASIX) 20 MG tablet Take 1 tablet (20 mg total) by mouth daily as needed for fluid or edema. 30 tablet 3   HYDROmorphone (DILAUDID) 2 MG tablet Take 1 tablet (2 mg total) by mouth every 3 (three) hours as needed for severe pain (pain score 7-10). 90 tablet 0   lidocaine-prilocaine (EMLA) cream Apply to affected area once 30 g 3   methadone (DOLOPHINE) 5 MG tablet Take 1 tablet (5 mg total) by mouth every 12 (twelve) hours. 60 tablet 0   metoCLOPramide (REGLAN) 10 MG tablet Take 1 tablet (10 mg total) by mouth 4 (four) times daily  before meals and at bedtime. 120 tablet 3   Nutritional Supplements (FEEDING SUPPLEMENT, OSMOLITE 1.5 CAL,) LIQD At 60 mL/h over 18 hours on a daily basis. 30000 mL 0   ondansetron (ZOFRAN-ODT) 8 MG disintegrating tablet Take 1 tablet (8 mg total) by mouth every 8 (eight) hours as needed for nausea or vomiting. 30 tablet 3   pantoprazole (PROTONIX) 40 MG tablet Take 1 tablet (40 mg total) by mouth 2 (two) times daily. 60 tablet 1   polyethylene glycol powder (GLYCOLAX/MIRALAX) 17 GM/SCOOP powder Dissolve 17 g in 4 oz of liquid and take by mouth daily. 476 g 0   potassium chloride (KLOR-CON) 20 MEQ packet MIx and drink 20 mEq (1 packet) by mouth daily. 30 packet 3   prochlorperazine (COMPAZINE) 10 MG tablet Take 1  tablet (10 mg total) by mouth every 6 (six) hours as needed for nausea or vomiting. 30 tablet 1   senna-docusate (SENOKOT-S) 8.6-50 MG tablet Take 2 tablets by mouth at bedtime. 60 tablet 2   sucralfate (CARAFATE) 1 GM/10ML suspension Take 10 mLs (1 g total) by mouth 4 (four) times daily -  with meals and at bedtime. 1200 mL 2   traZODone (DESYREL) 50 MG tablet Take 1 tablet (50 mg total) by mouth at bedtime. 30 tablet 0   Water For Irrigation, Sterile (FREE WATER) SOLN Place 100 mLs into feeding tube 4 (four) times daily. 12000 mL 1   feeding supplement (ENSURE ENLIVE / ENSURE PLUS) LIQD Take as directed (Patient not taking: Reported on 10/02/2023) 948 mL 12   No current facility-administered medications for this visit.     VITALS:  Blood pressure (!) 89/59, pulse 67, temperature 98.2 F (36.8 C), temperature source Temporal, resp. rate 16, height 5\' 6"  (1.676 m), weight 138 lb 14.4 oz (63 kg), SpO2 98%.  Wt Readings from Last 3 Encounters:  10/02/23 138 lb 14.4 oz (63 kg)  09/18/23 151 lb 11.2 oz (68.8 kg)  09/10/23 160 lb (72.6 kg)    Body mass index is 22.42 kg/m.   Onc Performance Status - 10/02/23 1156       ECOG Perf Status   ECOG Perf Status Capable of only limited selfcare, confined to bed or chair more than 50% of waking hours      KPS SCALE   KPS % SCORE Requires considerable assistance, and frequent medical care                PHYSICAL EXAM:   Physical Exam Constitutional:      General: He is not in acute distress.    Appearance: Normal appearance.  HENT:     Head: Normocephalic and atraumatic.  Eyes:     General: No scleral icterus.    Conjunctiva/sclera: Conjunctivae normal.  Cardiovascular:     Rate and Rhythm: Normal rate and regular rhythm.     Heart sounds: Normal heart sounds.  Pulmonary:     Effort: Pulmonary effort is normal.     Breath sounds: Normal breath sounds.  Chest:     Comments: Port-A-Cath in place without signs of  infection Abdominal:     General: There is no distension.     Comments: J-tube in place  Musculoskeletal:     Right lower leg: Edema present.     Left lower leg: Edema present.  Lymphadenopathy:     Cervical: No cervical adenopathy.  Neurological:     General: No focal deficit present.     Mental Status: He is oriented to person, place, and time.  Psychiatric:        Mood and Affect: Mood normal.        Behavior: Behavior normal.      LABORATORY DATA:   I have reviewed the data as listed.  Results for orders placed or performed in visit on 10/02/23  CMP (Cancer Center only)  Result Value Ref Range   Sodium 130 (L) 135 - 145 mmol/L   Potassium 3.8 3.5 - 5.1 mmol/L   Chloride 96 (L) 98 - 111 mmol/L   CO2 28 22 - 32 mmol/L   Glucose, Bld 121 (H) 70 - 99 mg/dL   BUN 6 6 - 20 mg/dL   Creatinine 9.60 (L) 4.54 - 1.24 mg/dL   Calcium 8.7 (L) 8.9 - 10.3 mg/dL   Total Protein 6.0 (L) 6.5 - 8.1 g/dL   Albumin 3.4 (L) 3.5 - 5.0 g/dL   AST 14 (L) 15 - 41 U/L   ALT 12 0 - 44 U/L   Alkaline Phosphatase 50 38 - 126 U/L   Total Bilirubin 0.6 0.0 - 1.2 mg/dL   GFR, Estimated >09 >81 mL/min   Anion gap 6 5 - 15  CBC with Differential (Cancer Center Only)  Result Value Ref Range   WBC Count 1.9 (L) 4.0 - 10.5 K/uL   RBC 4.72 4.22 - 5.81 MIL/uL   Hemoglobin 11.1 (L) 13.0 - 17.0 g/dL   HCT 19.1 (L) 47.8 - 29.5 %   MCV 73.7 (L) 80.0 - 100.0 fL   MCH 23.5 (L) 26.0 - 34.0 pg   MCHC 31.9 30.0 - 36.0 g/dL   RDW  21.3 (H) 11.5 - 15.5 %   Platelet Count 155 150 - 400 K/uL   nRBC 0.0 0.0 - 0.2 %   Neutrophils Relative % 43 %   Neutro Abs 0.9 (L) 1.7 - 7.7 K/uL   Lymphocytes Relative 20 %   Lymphs Abs 0.4 (L) 0.7 - 4.0 K/uL   Monocytes Relative 31 %   Monocytes Absolute 0.6 0.1 - 1.0 K/uL   Eosinophils Relative 5 %   Eosinophils Absolute 0.1 0.0 - 0.5 K/uL   Basophils Relative 1 %   Basophils Absolute 0.0 0.0 - 0.1 K/uL   Immature Granulocytes 0 %   Abs Immature Granulocytes 0.00 0.00  - 0.07 K/uL      RADIOGRAPHIC STUDIES:  I have personally reviewed the radiological images as listed and agree with the findings in the report.  NM PET Image Restag (PS) Skull Base To Thigh Result Date: 10/02/2023 CLINICAL DATA:  Subsequent treatment strategy for gastric cancer. EXAM: NUCLEAR MEDICINE PET SKULL BASE TO THIGH TECHNIQUE: 8.0 mCi F-18 FDG was injected intravenously. Full-ring PET imaging was performed from the skull base to thigh after the radiotracer. CT data was obtained and used for attenuation correction and anatomic localization. Fasting blood glucose: 100 mg/dl COMPARISON:  62/95/2841. FINDINGS: Mediastinal blood pool activity: SUV max 1.8 Liver activity: SUV max NA NECK: Posterior nasopharyngeal uptake without CT correlate. No abnormal hypermetabolism. Incidental CT findings: None. CHEST: New bihilar hypermetabolism, SUV max 4.6 on the left. No additional abnormal hypermetabolism. Incidental CT findings: Right IJ Port-A-Cath terminates in the right atrium. Heart is enlarged. No pericardial or pleural effusion. ABDOMEN/PELVIS: Marked decrease in size of a proximal gastric mass with trace residual uptake, SUV max 4.6, previously 27.7. Marked improvement in associated hypermetabolic adenopathy with a residual left periaortic lymph node measuring 9 mm (4/117), SUV max 6.3. Apparent hypermetabolism in the inferior right hepatic lobe is likely due to volume averaging with the adjacent colon. No residual hypermetabolic peritoneal nodules. No additional abnormal hypermetabolism. Incidental CT findings: Small right hepatic lobe cyst.  Percutaneous jejunostomy. SKELETON: No abnormal hypermetabolism. Incidental CT findings: None. IMPRESSION: 1. Marked interval response to therapy as evidenced by marked decrease in size and hypermetabolism of the primary gastric mass with a single residual hypermetabolic left periaortic lymph node. No residual peritoneal metastatic disease. 2. New bihilar  hypermetabolism is nonspecific. Recommend attention on follow-up. Electronically Signed   By: Leanna Battles M.D.   On: 10/02/2023 09:30   VAS [place] LOWER EXTREMITY VENOUS (DVT) Result Date: 09/06/2023  Lower Venous DVT Study Patient Name:  GIUSEPPE DUCHEMIN  Date of Exam:   09/06/2023 Medical Rec #: [id number]       Accession #:    [id number] Date of Birth: 08/24/1975      Patient Gender: M Patient Age:   61 years Exam Location:  [facility] Procedure:      VAS [place] LOWER EXTREMITY VENOUS (DVT) Referring Phys: Santiago Glad --------------------------------------------------------------------------------  Indications: Edema.  Limitations: Poor ultrasound/tissue interface. Comparison Study: No previous exams Performing Technologist: Jody Hill RVT, RDMS  Examination Guidelines: A complete evaluation includes B-mode imaging, spectral Doppler, color Doppler, and power Doppler as needed of all accessible portions of each vessel. Bilateral testing is considered an integral part of a complete examination. Limited examinations for reoccurring indications may be performed as noted. The reflux portion of the exam is performed with the patient in reverse Trendelenburg.  +---------+---------------+---------+-----------+----------+-------------------+ RIGHT    CompressibilityPhasicitySpontaneityPropertiesThrombus Aging      +---------+---------------+---------+-----------+----------+-------------------+ CFV  Full           Yes      Yes                                      +---------+---------------+---------+-----------+----------+-------------------+ SFJ      Full                                                             +---------+---------------+---------+-----------+----------+-------------------+ FV Prox  Full           Yes      Yes                                      +---------+---------------+---------+-----------+----------+-------------------+ FV Mid   Full           Yes      Yes                                       +---------+---------------+---------+-----------+----------+-------------------+ FV DistalFull           Yes      Yes                                      +---------+---------------+---------+-----------+----------+-------------------+ PFV      Full                                                             +---------+---------------+---------+-----------+----------+-------------------+ POP      Full           Yes      Yes                                      +---------+---------------+---------+-----------+----------+-------------------+ PTV      Full                                         Not well visualized +---------+---------------+---------+-----------+----------+-------------------+ PERO     Full                                         Not well visualized +---------+---------------+---------+-----------+----------+-------------------+   +---------+---------------+---------+-----------+----------+-------------------+ LEFT     CompressibilityPhasicitySpontaneityPropertiesThrombus Aging      +---------+---------------+---------+-----------+----------+-------------------+ CFV      Full           Yes      Yes                                      +---------+---------------+---------+-----------+----------+-------------------+  SFJ      Full                                                             +---------+---------------+---------+-----------+----------+-------------------+ FV Prox  Full           Yes      Yes                                      +---------+---------------+---------+-----------+----------+-------------------+ FV Mid   Full           Yes      Yes                                      +---------+---------------+---------+-----------+----------+-------------------+ FV DistalFull           Yes      Yes                                       +---------+---------------+---------+-----------+----------+-------------------+ PFV      Full                                                             +---------+---------------+---------+-----------+----------+-------------------+ POP      Full           Yes      Yes                                      +---------+---------------+---------+-----------+----------+-------------------+ PTV      Full                                         Not well visualized +---------+---------------+---------+-----------+----------+-------------------+ PERO     Full                                         Not well visualized +---------+---------------+---------+-----------+----------+-------------------+     Summary: BILATERAL: - No evidence of deep vein thrombosis seen in the lower extremities, bilaterally. -No evidence of popliteal cyst, bilaterally. - Ultrasound characteristics of enlarged lymph nodes are noted in both groin groins. -Diffuse subcutaneous, bilaterally.   *See table(s) above for measurements and observations. Electronically signed by Gerarda Fraction on 09/06/2023 at 4:07:05 PM.    Final     CODE STATUS:  Code Status History     Date Active Date Inactive Code Status Order ID Comments User Context   06/14/2023 1621 07/11/2023 2225 Full Code [id number]  Bobette Mo, MD Inpatient   06/13/2023 1253 06/14/2023 0509 Full Code [id number]  Richarda Overlie, MD [facility]   05/27/2023 1516 05/30/2023 1651  Full Code [id number]  Maryln Gottron, MD ED    Questions for Most Recent Historical Code Status (Order [id number])     Question Answer   By: Consent: discussion documented in EHR            Orders Placed This Encounter  Procedures   CBC with Differential (Cancer Center Only)    Standing Status:   Future    Expected Date:   10/16/2023    Expiration Date:   10/15/2024   CMP (Cancer Center only)    Standing Status:   Future    Expected Date:   10/16/2023    Expiration Date:    10/15/2024   T4    Standing Status:   Future    Expected Date:   10/16/2023    Expiration Date:   10/15/2024   TSH    Standing Status:   Future    Expected Date:   10/16/2023    Expiration Date:   10/15/2024     Future Appointments  Date Time Provider Department Center  10/02/2023  1:45 PM Noreene Larsson, RD [facility] None  10/02/2023  2:00 PM Pickenpack-Cousar, Arty Baumgartner, NP [facility] None  10/04/2023  2:30 PM [facility] FLUSH [facility] None  10/21/2023  9:15 AM [facility] None     This document was completed utilizing speech recognition software. Grammatical errors, random word insertions, pronoun errors, and incomplete sentences are an occasional consequence of this system due to software limitations, ambient noise, and hardware issues. Any formal questions or concerns about the content, text or information contained within the body of this dictation should be directly addressed to the provider for clarification.

## 2023-10-02 NOTE — Assessment & Plan Note (Addendum)
-  Continue to monitor CBCD with each visit and transfuse as needed to maintain hemoglobin closer to 8.  Currently hemoglobin is stable at 11.1

## 2023-10-02 NOTE — Assessment & Plan Note (Addendum)
-  Please review HPI/oncology history for additional details and timeline of events.  -Reviewed staging PET/CT findings with the patient previously.  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  Discussed treatment options. All treatment options are palliative in nature and not curative intent. Plan made to proceed with palliative systemic treatment using FOLFOX plus nivolumab.   -He started palliative systemic treatments with FOLFOX plus nivolumab from 07/23/2023.  He was admitted to the hospital on 07/30/2023 after he presented with chest and epigastric pain, episodes of melena. He was found to have hemoglobin of 6.8. He was admitted for blood transfusion and further evaluation.  GI deferred additional workup/EGD since the cause of blood loss seemed obvious from malignancy. He was discharged home on 08/02/2023.   -He resumed systemic treatments with cycle 2 of FOLFOX plus nivolumab on 08/07/2023 without 5-FU bolus and a 20% dose reduction of oxaliplatin and tolerated the chemotherapy and immunotherapy reasonably well.  Did not experience major side effects.  Recent lower extremity ultrasound to rule out DVT showed possible inguinal lymphadenopathy.  Hence PET/CT was obtained for further evaluation on 09/10/2023.  It showed significant improvement with decreased size and metabolic some of the gastric mass.  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  Overall PET scan showing encouraging results.  Hence plan made to continue current management with dose reduced FOLFOX and nivolumab.  Last chemotherapy with FOLFOX and nivolumab was on 09/18/2023.  He presented to clinic today for possible cycle 6 of chemoimmunotherapy.  However over the last 2 weeks, he has not been eating very well and has had progressive decline in his performance status and ECOG is 3 today.  Hence we will have to hold chemotherapy today.  We  will reassess in approximately 2 weeks.  He also has progressive leukopenia with white count of 1900 today, ANC 900.  Hopefully blood counts will improve with a break in treatment.  Hemoglobin is much better at 11.1.  Platelet count is normal at 155,000.  Creatinine 0.49, normal LFTs.  He was given 1 L of NS bolus today.  He was advised to continue adequate hydration and nutrition at home and focus on being mobile.  RTC in 2 weeks for possible resumption of chemoimmunotherapy.  He was made aware of my absence in clinic around that time and will be seen by one of our other providers.  If performance status and blood counts are low, plan to resume treatment at same dose.  Otherwise we can skip oxaliplatin and continue with 5-FU and nivolumab treatments.  Following cycle 6, plan is to transition him to maintenance treatments with 5-FU and nivolumab.  This will be continued every 2 weeks.  Only if intolerable side effects are noted to chemotherapy, then we will transition him to maintenance treatment with immunotherapy alone.

## 2023-10-03 ENCOUNTER — Emergency Department (HOSPITAL_COMMUNITY)

## 2023-10-03 ENCOUNTER — Emergency Department (HOSPITAL_COMMUNITY)
Admission: EM | Admit: 2023-10-03 | Discharge: 2023-10-03 | Disposition: A | Discharge status: Home / Self Care | Attending: Emergency Medicine | Admitting: Emergency Medicine

## 2023-10-03 ENCOUNTER — Encounter (HOSPITAL_COMMUNITY): Payer: Self-pay | Admitting: Emergency Medicine

## 2023-10-03 ENCOUNTER — Other Ambulatory Visit: Payer: Self-pay

## 2023-10-03 DIAGNOSIS — R627 Adult failure to thrive: Secondary | ICD-10-CM | POA: Diagnosis not present

## 2023-10-03 DIAGNOSIS — E8809 Other disorders of plasma-protein metabolism, not elsewhere classified: Secondary | ICD-10-CM | POA: Diagnosis not present

## 2023-10-03 DIAGNOSIS — C155 Malignant neoplasm of lower third of esophagus: Secondary | ICD-10-CM | POA: Insufficient documentation

## 2023-10-03 DIAGNOSIS — R531 Weakness: Secondary | ICD-10-CM | POA: Diagnosis present

## 2023-10-03 LAB — CBC WITH DIFFERENTIAL/PLATELET
Abs Immature Granulocytes: 0.02 10*3/uL (ref 0.00–0.07)
Basophils Absolute: 0 10*3/uL (ref 0.0–0.1)
Basophils Relative: 1 %
Eosinophils Absolute: 0 10*3/uL (ref 0.0–0.5)
Eosinophils Relative: 1 %
HCT: 35.5 % — ABNORMAL LOW (ref 39.0–52.0)
Hemoglobin: 10.9 g/dL — ABNORMAL LOW (ref 13.0–17.0)
Immature Granulocytes: 1 %
Lymphocytes Relative: 32 %
Lymphs Abs: 0.9 10*3/uL (ref 0.7–4.0)
MCH: 23.3 pg — ABNORMAL LOW (ref 26.0–34.0)
MCHC: 30.7 g/dL (ref 30.0–36.0)
MCV: 76 fL — ABNORMAL LOW (ref 80.0–100.0)
Monocytes Absolute: 0.9 10*3/uL (ref 0.1–1.0)
Monocytes Relative: 32 %
Neutro Abs: 0.9 10*3/uL — ABNORMAL LOW (ref 1.7–7.7)
Neutrophils Relative %: 33 %
Platelets: 216 10*3/uL (ref 150–400)
RBC: 4.67 MIL/uL (ref 4.22–5.81)
RDW: 22 % — ABNORMAL HIGH (ref 11.5–15.5)
WBC: 2.7 10*3/uL — ABNORMAL LOW (ref 4.0–10.5)
nRBC: 0 % (ref 0.0–0.2)

## 2023-10-03 LAB — COMPREHENSIVE METABOLIC PANEL WITH GFR
ALT: 14 U/L (ref 0–44)
AST: 18 U/L (ref 15–41)
Albumin: 2.9 g/dL — ABNORMAL LOW (ref 3.5–5.0)
Alkaline Phosphatase: 46 U/L (ref 38–126)
Anion gap: 7 (ref 5–15)
BUN: 9 mg/dL (ref 6–20)
CO2: 24 mmol/L (ref 22–32)
Calcium: 8.4 mg/dL — ABNORMAL LOW (ref 8.9–10.3)
Chloride: 103 mmol/L (ref 98–111)
Creatinine, Ser: 0.44 mg/dL — ABNORMAL LOW (ref 0.61–1.24)
GFR, Estimated: >60 mL/min (ref >60)
Glucose, Bld: 105 mg/dL — ABNORMAL HIGH (ref 70–99)
Potassium: 3.7 mmol/L (ref 3.5–5.1)
Sodium: 134 mmol/L — ABNORMAL LOW (ref 135–145)
Total Bilirubin: 0.7 mg/dL (ref 0.0–1.2)
Total Protein: 5.9 g/dL — ABNORMAL LOW (ref 6.5–8.1)

## 2023-10-03 LAB — URINALYSIS, ROUTINE W REFLEX MICROSCOPIC
Bilirubin Urine: NEGATIVE
Glucose, UA: NEGATIVE mg/dL
Hgb urine dipstick: NEGATIVE
Ketones, ur: NEGATIVE mg/dL
Leukocytes,Ua: NEGATIVE
Nitrite: NEGATIVE
Protein, ur: NEGATIVE mg/dL
Specific Gravity, Urine: 1.003 — ABNORMAL LOW (ref 1.005–1.030)
pH: 6 (ref 5.0–8.0)

## 2023-10-03 LAB — T4: T4, Total: 10.6 ug/dL (ref 4.5–12.0)

## 2023-10-03 MED ORDER — IOHEXOL 300 MG/ML  SOLN
75.0000 mL | Freq: Once | INTRAMUSCULAR | Status: AC | PRN
  Administered 2023-10-03: 75 mL via INTRAVENOUS

## 2023-10-03 MED ORDER — LACTATED RINGERS IV BOLUS
1000.0000 mL | Freq: Once | INTRAVENOUS | Status: AC
  Administered 2023-10-03: 1000 mL via INTRAVENOUS

## 2023-10-03 NOTE — ED Provider Notes (Signed)
 [place] [facility] AT [facility] Provider Note   CSN: [id number] Arrival date & time: 10/03/23  1422     History  Chief Complaint  Patient presents with   Weakness   Altered Mental Status    Jesus Haynes is a 48 y.o. male.  Pt with hx metastatic esophageal cancer, last chemo ~ 2-3 weeks ago, recent radiation treatments, with general weakness, progressive weight loss, decreased appetite/decreased po intake, in the past couple weeks. Has been increasingly lethargic in past couple days. Pt limited historian - level 5 caveat. No trauma/fall. No fever/chills. No focal/new pain. No headache. No chest pain. No sob. No abd pain or vomiting/diarrhea. No dysuria. No focal extremity pain or swelling. On chronic pain meds, but taking a bit less than normal.  EMS notes initial bp low, now improved w ivf. Cbg 187.   The history is provided by the patient, medical records, a relative and the EMS personnel. The history is limited by the condition of the patient.       Home Medications Prior to Admission medications   Medication Sig Start Date End Date Taking? Authorizing Provider  albuterol (PROVENTIL) (2.5 MG/3ML) 0.083% nebulizer solution Take 3 mLs (2.5 mg total) by nebulization every 6 (six) hours as needed for wheezing or shortness of breath. 08/07/23   Pickenpack-Cousar, Arty Baumgartner, NP  albuterol (VENTOLIN HFA) 108 (90 Base) MCG/ACT inhaler Inhale 1-2 puffs into the lungs every 4 (four) hours as needed for wheezing or shortness of breath. 08/07/23   Pasam, Archie Patten, MD  Albuterol Sulfate 2.5 MG/0.5ML NEBU Use 1 vial in nebulizer as directed every 6 hours as needed for wheezing or shortness of breath 04/28/23   Roxy Horseman, PA-C  cyanocobalamin 1000 MCG tablet Take 1 tablet (1,000 mcg total) by mouth daily. 07/10/23   Osvaldo Shipper, MD  dronabinol (MARINOL) 5 MG capsule Take 1 capsule (5 mg total) by mouth 2 (two) times daily before lunch and supper. 10/02/23    Pickenpack-Cousar, Arty Baumgartner, NP  feeding supplement (ENSURE ENLIVE / ENSURE PLUS) LIQD Take as directed Patient not taking: Reported on 10/02/2023 05/30/23   Lanae Boast, MD  folic acid (FOLVITE) 1 MG tablet Take 1 tablet (1 mg total) by mouth daily. 07/10/23   Osvaldo Shipper, MD  furosemide (LASIX) 20 MG tablet Take 1 tablet (20 mg total) by mouth daily as needed for fluid or edema. 08/07/23   Pasam, Archie Patten, MD  HYDROmorphone (DILAUDID) 2 MG tablet Take 1 tablet (2 mg total) by mouth every 6 (six) hours as needed for severe pain (pain score 7-10). 10/02/23   Pickenpack-Cousar, Arty Baumgartner, NP  lidocaine-prilocaine (EMLA) cream Apply to affected area once 07/11/23   Pasam, Avinash, MD  methadone (DOLOPHINE) 5 MG tablet Take 1 tablet (5 mg total) by mouth daily. 10/02/23   Pickenpack-Cousar, Arty Baumgartner, NP  metoCLOPramide (REGLAN) 10 MG tablet Take 1 tablet (10 mg total) by mouth 4 (four) times daily  before meals and at bedtime. 08/21/23   Pickenpack-Cousar, Arty Baumgartner, NP  Nutritional Supplements (FEEDING SUPPLEMENT, OSMOLITE 1.5 CAL,) LIQD At 60 mL/h over 18 hours on a daily basis. 07/10/23   Osvaldo Shipper, MD  ondansetron (ZOFRAN-ODT) 8 MG disintegrating tablet Dissolve 1 tablet (8 mg total) by mouth every 8 (eight) hours as needed for nausea or vomiting. 10/02/23   Pasam, Archie Patten, MD  pantoprazole (PROTONIX) 40 MG tablet Take 1 tablet (40 mg total) by mouth 2 (two) times daily. 08/02/23 10/10/23  Rhetta Mura, MD  polyethylene glycol powder (GLYCOLAX/MIRALAX) 17 GM/SCOOP powder Dissolve 17 g in 4 oz of liquid and take by mouth daily. 07/10/23   Osvaldo Shipper, MD  potassium chloride (KLOR-CON) 20 MEQ packet MIx and drink 20 mEq (1 packet) by mouth daily. 08/21/23   Pickenpack-Cousar, Arty Baumgartner, NP  prochlorperazine (COMPAZINE) 10 MG tablet Take 1 tablet (10 mg total) by mouth every 6 (six) hours as needed for nausea or vomiting. 10/02/23   Pasam, Archie Patten, MD  senna-docusate (SENOKOT-S) 8.6-50 MG tablet Take 2 tablets  by mouth at bedtime. 07/10/23   Osvaldo Shipper, MD  sucralfate (CARAFATE) 1 GM/10ML suspension Take 10 mLs (1 g total) by mouth 4 (four) times daily -  with meals and at bedtime. 08/21/23   Pickenpack-Cousar, Arty Baumgartner, NP  traZODone (DESYREL) 50 MG tablet Take 1 tablet (50 mg total) by mouth at bedtime. 10/02/23   Pickenpack-Cousar, Arty Baumgartner, NP  Water For Irrigation, Sterile (FREE WATER) SOLN Place 100 mLs into feeding tube 4 (four) times daily. 07/10/23   Osvaldo Shipper, MD  cetirizine (ZYRTEC) 10 MG tablet Take 1 tablet (10 mg total) by mouth daily. Patient not taking: Reported on 01/26/2019 02/23/18 07/26/20  Maczis, Elmer Sow, PA-C  fluticasone [facility]) 50 MCG/ACT nasal spray Place 1-2 sprays into both nostrils daily. Patient not taking: Reported on 01/26/2019 02/23/18 07/26/20  Jacinto Halim, PA-C      Allergies    Patient has no known allergies.    Review of Systems   Review of Systems  Constitutional:  Negative for chills and fever.  HENT:  Negative for sore throat.   Eyes:  Negative for visual disturbance.  Respiratory:  Negative for cough and shortness of breath.   Cardiovascular:  Negative for chest pain.  Gastrointestinal:  Negative for abdominal pain, diarrhea and vomiting.  Genitourinary:  Negative for dysuria and flank pain.  Musculoskeletal:  Negative for back pain, neck pain and neck stiffness.  Skin:  Negative for rash.  Neurological:  Negative for headaches.  Psychiatric/Behavioral:  Negative for confusion.     Physical Exam Updated Vital Signs BP 99/63   Pulse 80   Temp (!) 97.5 F (36.4 C) (Axillary)   Resp 18   Ht 1.676 m (5\' 6" )   Wt 63 kg   SpO2 100%   BMI 22.42 kg/m  Physical Exam Vitals and nursing note reviewed.  Constitutional:      Appearance: Normal appearance. He is well-developed.  HENT:     Head: Atraumatic.     Nose: Nose normal.     Mouth/Throat:     Mouth: Mucous membranes are moist.     Pharynx: Oropharynx is clear. No oropharyngeal  exudate or posterior oropharyngeal erythema.  Eyes:     General: No scleral icterus.    Conjunctiva/sclera: Conjunctivae normal.     Pupils: Pupils are equal, round, and reactive to light.  Neck:     Trachea: No tracheal deviation.     Comments: Trachea midline. Thyroid not grossly enlarged or tender. No stiffness or rigidity.  Cardiovascular:     Rate and Rhythm: Normal rate and regular rhythm.     Pulses: Normal pulses.     Heart sounds: Normal heart sounds. No murmur heard.    No friction rub. No gallop.  Pulmonary:     Effort: Pulmonary effort is normal. No accessory muscle usage or respiratory distress.     Breath sounds: Normal breath sounds.  Abdominal:     General: Bowel sounds are normal. There  is no distension.     Palpations: Abdomen is soft.     Tenderness: There is no abdominal tenderness. There is no guarding.     Comments: Feeding tube, site intact, without sign of infection  Genitourinary:    Comments: No cva tenderness. Musculoskeletal:        General: No swelling or tenderness.     Cervical back: Normal range of motion and neck supple. No rigidity.     Right lower leg: No edema.     Left lower leg: No edema.     Comments: CTLS spine, non tender, aligned, no step off. No focal extremity pain/swelling.   Lymphadenopathy:     Cervical: No cervical adenopathy.  Skin:    General: Skin is warm and dry.     Findings: No rash.  Neurological:     Mental Status: He is alert.     Comments: Alert, mildly slow to respond. Motor/sens grossly intact bil.      ED Results / Procedures / Treatments   Labs (all labs ordered are listed, but only abnormal results are displayed) Results for orders placed or performed during the hospital encounter of 10/03/23  Comprehensive metabolic panel with GFR   Collection Time: 10/03/23  5:10 PM  Result Value Ref Range   Sodium 134 (L) 135 - 145 mmol/L   Potassium 3.7 3.5 - 5.1 mmol/L   Chloride 103 98 - 111 mmol/L   CO2 24 22 - 32  mmol/L   Glucose, Bld 105 (H) 70 - 99 mg/dL   BUN 9 6 - 20 mg/dL   Creatinine, Ser 0.45 (L) 0.61 - 1.24 mg/dL   Calcium 8.4 (L) 8.9 - 10.3 mg/dL   Total Protein 5.9 (L) 6.5 - 8.1 g/dL   Albumin 2.9 (L) 3.5 - 5.0 g/dL   AST 18 15 - 41 U/L   ALT 14 0 - 44 U/L   Alkaline Phosphatase 46 38 - 126 U/L   Total Bilirubin 0.7 0.0 - 1.2 mg/dL   GFR, Estimated >40 >98 mL/min   Anion gap 7 5 - 15  Urinalysis, Routine w reflex microscopic -Urine, Clean Catch   Collection Time: 10/03/23  5:10 PM  Result Value Ref Range   Color, Urine STRAW (A) YELLOW   APPearance CLEAR CLEAR   Specific Gravity, Urine 1.003 (L) 1.005 - 1.030   pH 6.0 5.0 - 8.0   Glucose, UA NEGATIVE NEGATIVE mg/dL   Hgb urine dipstick NEGATIVE NEGATIVE   Bilirubin Urine NEGATIVE NEGATIVE   Ketones, ur NEGATIVE NEGATIVE mg/dL   Protein, ur NEGATIVE NEGATIVE mg/dL   Nitrite NEGATIVE NEGATIVE   Leukocytes,Ua NEGATIVE NEGATIVE  CBC with Differential   Collection Time: 10/03/23  5:10 PM  Result Value Ref Range   WBC 2.7 (L) 4.0 - 10.5 K/uL   RBC 4.67 4.22 - 5.81 MIL/uL   Hemoglobin 10.9 (L) 13.0 - 17.0 g/dL   HCT 11.9 (L) 14.7 - 82.9 %   MCV 76.0 (L) 80.0 - 100.0 fL   MCH 23.3 (L) 26.0 - 34.0 pg   MCHC 30.7 30.0 - 36.0 g/dL   RDW 56.2 (H) 13.0 - 86.5 %   Platelets 216 150 - 400 K/uL   nRBC 0.0 0.0 - 0.2 %   Neutrophils Relative % 33 %   Neutro Abs 0.9 (L) 1.7 - 7.7 K/uL   Lymphocytes Relative 32 %   Lymphs Abs 0.9 0.7 - 4.0 K/uL   Monocytes Relative 32 %   Monocytes Absolute 0.9  0.1 - 1.0 K/uL   Eosinophils Relative 1 %   Eosinophils Absolute 0.0 0.0 - 0.5 K/uL   Basophils Relative 1 %   Basophils Absolute 0.0 0.0 - 0.1 K/uL   Immature Granulocytes 1 %   Abs Immature Granulocytes 0.02 0.00 - 0.07 K/uL   Reactive, Benign Lymphocytes PRESENT    Ovalocytes PRESENT    CT HEAD W & WO CONTRAST ( ) Result Date: 10/03/2023 CLINICAL DATA:  Altered mental status and suspected brain metastases EXAM: CT HEAD WITHOUT AND WITH  CONTRAST TECHNIQUE: Contiguous axial images were obtained from the base of the skull through the vertex without and with intravenous contrast. RADIATION DOSE REDUCTION: This exam was performed according to the departmental dose-optimization program which includes automated exposure control, adjustment of the mA and/or kV according to patient size and/or use of iterative reconstruction technique. CONTRAST:  75mL OMNIPAQUE IOHEXOL 300 MG/ML  SOLN COMPARISON:  07/03/2023 FINDINGS: Brain: No mass,hemorrhage or extra-axial collection. Normal appearance of the parenchyma and CSF spaces. No abnormal enhancement. Vascular: No hyperdense vessel or unexpected vascular calcification. Skull: The visualized skull base, calvarium and extracranial soft tissues are normal. Sinuses/Orbits: No fluid levels or advanced mucosal thickening of the visualized paranasal sinuses. No mastoid or middle ear effusion. Normal orbits. Other: None. IMPRESSION: Normal head CT. Electronically Signed   By: Deatra Robinson M.D.   On: 10/03/2023 20:23   DG Chest Port 1 View Result Date: 10/03/2023 CLINICAL DATA:  Weakness. EXAM: PORTABLE CHEST 1 VIEW COMPARISON:  Chest radiograph dated 07/30/2023. FINDINGS: Right-sided Port-A-Cath with tip at cavoatrial junction. No focal consolidation, pleural effusion, pneumothorax. The cardiac silhouette is within normal limits. No acute osseous pathology. IMPRESSION: No active disease. Electronically Signed   By: Elgie Collard M.D.   On: 10/03/2023 17:37   NM PET Image Restag (PS) Skull Base To Thigh Result Date: 10/02/2023 CLINICAL DATA:  Subsequent treatment strategy for gastric cancer. EXAM: NUCLEAR MEDICINE PET SKULL BASE TO THIGH TECHNIQUE: 8.0 mCi F-18 FDG was injected intravenously. Full-ring PET imaging was performed from the skull base to thigh after the radiotracer. CT data was obtained and used for attenuation correction and anatomic localization. Fasting blood glucose: 100 mg/dl COMPARISON:   82/95/6213. FINDINGS: Mediastinal blood pool activity: SUV max 1.8 Liver activity: SUV max NA NECK: Posterior nasopharyngeal uptake without CT correlate. No abnormal hypermetabolism. Incidental CT findings: None. CHEST: New bihilar hypermetabolism, SUV max 4.6 on the left. No additional abnormal hypermetabolism. Incidental CT findings: Right IJ Port-A-Cath terminates in the right atrium. Heart is enlarged. No pericardial or pleural effusion. ABDOMEN/PELVIS: Marked decrease in size of a proximal gastric mass with trace residual uptake, SUV max 4.6, previously 27.7. Marked improvement in associated hypermetabolic adenopathy with a residual left periaortic lymph node measuring 9 mm (4/117), SUV max 6.3. Apparent hypermetabolism in the inferior right hepatic lobe is likely due to volume averaging with the adjacent colon. No residual hypermetabolic peritoneal nodules. No additional abnormal hypermetabolism. Incidental CT findings: Small right hepatic lobe cyst.  Percutaneous jejunostomy. SKELETON: No abnormal hypermetabolism. Incidental CT findings: None. IMPRESSION: 1. Marked interval response to therapy as evidenced by marked decrease in size and hypermetabolism of the primary gastric mass with a single residual hypermetabolic left periaortic lymph node. No residual peritoneal metastatic disease. 2. New bihilar hypermetabolism is nonspecific. Recommend attention on follow-up. Electronically Signed   By: Leanna Battles M.D.   On: 10/02/2023 09:30   VAS [place] LOWER EXTREMITY VENOUS (DVT) Result Date: 09/06/2023  Lower Venous DVT Study Patient  Name:  Jesus Haynes  Date of Exam:   09/06/2023 Medical Rec #: [id number]       Accession #:    [id number] Date of Birth: 21-Jul-1975      Patient Gender: M Patient Age:   36 years Exam Location:  [facility] Procedure:      VAS [place] LOWER EXTREMITY VENOUS (DVT) Referring Phys: Santiago Glad --------------------------------------------------------------------------------   Indications: Edema.  Limitations: Poor ultrasound/tissue interface. Comparison Study: No previous exams Performing Technologist: Jody Hill RVT, RDMS  Examination Guidelines: A complete evaluation includes B-mode imaging, spectral Doppler, color Doppler, and power Doppler as needed of all accessible portions of each vessel. Bilateral testing is considered an integral part of a complete examination. Limited examinations for reoccurring indications may be performed as noted. The reflux portion of the exam is performed with the patient in reverse Trendelenburg.  +---------+---------------+---------+-----------+----------+-------------------+ RIGHT    CompressibilityPhasicitySpontaneityPropertiesThrombus Aging      +---------+---------------+---------+-----------+----------+-------------------+ CFV      Full           Yes      Yes                                      +---------+---------------+---------+-----------+----------+-------------------+ SFJ      Full                                                             +---------+---------------+---------+-----------+----------+-------------------+ FV Prox  Full           Yes      Yes                                      +---------+---------------+---------+-----------+----------+-------------------+ FV Mid   Full           Yes      Yes                                      +---------+---------------+---------+-----------+----------+-------------------+ FV DistalFull           Yes      Yes                                      +---------+---------------+---------+-----------+----------+-------------------+ PFV      Full                                                             +---------+---------------+---------+-----------+----------+-------------------+ POP      Full           Yes      Yes                                      +---------+---------------+---------+-----------+----------+-------------------+ PTV  Full                                         Not well visualized +---------+---------------+---------+-----------+----------+-------------------+ PERO     Full                                         Not well visualized +---------+---------------+---------+-----------+----------+-------------------+   +---------+---------------+---------+-----------+----------+-------------------+ LEFT     CompressibilityPhasicitySpontaneityPropertiesThrombus Aging      +---------+---------------+---------+-----------+----------+-------------------+ CFV      Full           Yes      Yes                                      +---------+---------------+---------+-----------+----------+-------------------+ SFJ      Full                                                             +---------+---------------+---------+-----------+----------+-------------------+ FV Prox  Full           Yes      Yes                                      +---------+---------------+---------+-----------+----------+-------------------+ FV Mid   Full           Yes      Yes                                      +---------+---------------+---------+-----------+----------+-------------------+ FV DistalFull           Yes      Yes                                      +---------+---------------+---------+-----------+----------+-------------------+ PFV      Full                                                             +---------+---------------+---------+-----------+----------+-------------------+ POP      Full           Yes      Yes                                      +---------+---------------+---------+-----------+----------+-------------------+ PTV      Full                                         Not well visualized +---------+---------------+---------+-----------+----------+-------------------+ PERO  Full                                         Not well visualized  +---------+---------------+---------+-----------+----------+-------------------+     Summary: BILATERAL: - No evidence of deep vein thrombosis seen in the lower extremities, bilaterally. -No evidence of popliteal cyst, bilaterally. - Ultrasound characteristics of enlarged lymph nodes are noted in both groin groins. -Diffuse subcutaneous, bilaterally.   *See table(s) above for measurements and observations. Electronically signed by Gerarda Fraction on 09/06/2023 at 4:07:05 PM.    Final      EKG None  Radiology CT HEAD W & WO CONTRAST ( ) Result Date: 10/03/2023 CLINICAL DATA:  Altered mental status and suspected brain metastases EXAM: CT HEAD WITHOUT AND WITH CONTRAST TECHNIQUE: Contiguous axial images were obtained from the base of the skull through the vertex without and with intravenous contrast. RADIATION DOSE REDUCTION: This exam was performed according to the departmental dose-optimization program which includes automated exposure control, adjustment of the mA and/or kV according to patient size and/or use of iterative reconstruction technique. CONTRAST:  75mL OMNIPAQUE IOHEXOL 300 MG/ML  SOLN COMPARISON:  07/03/2023 FINDINGS: Brain: No mass,hemorrhage or extra-axial collection. Normal appearance of the parenchyma and CSF spaces. No abnormal enhancement. Vascular: No hyperdense vessel or unexpected vascular calcification. Skull: The visualized skull base, calvarium and extracranial soft tissues are normal. Sinuses/Orbits: No fluid levels or advanced mucosal thickening of the visualized paranasal sinuses. No mastoid or middle ear effusion. Normal orbits. Other: None. IMPRESSION: Normal head CT. Electronically Signed   By: Deatra Robinson M.D.   On: 10/03/2023 20:23   DG Chest Port 1 View Result Date: 10/03/2023 CLINICAL DATA:  Weakness. EXAM: PORTABLE CHEST 1 VIEW COMPARISON:  Chest radiograph dated 07/30/2023. FINDINGS: Right-sided Port-A-Cath with tip at cavoatrial junction. No focal consolidation,  pleural effusion, pneumothorax. The cardiac silhouette is within normal limits. No acute osseous pathology. IMPRESSION: No active disease. Electronically Signed   By: Elgie Collard M.D.   On: 10/03/2023 17:37    Procedures Procedures    Medications Ordered in ED Medications  lactated ringers bolus 1,000 mL (1,000 mLs Intravenous New Bag/Given 10/03/23 1721)  iohexol (OMNIPAQUE) 300 MG/ML solution 75 mL (75 mLs Intravenous Contrast Given 10/03/23 1912)    ED Course/ Medical Decision Making/ A&P                                 Medical Decision Making Problems Addressed: Failure to thrive in adult: chronic illness or injury with exacerbation, progression, or side effects of treatment that poses a threat to life or bodily functions Generalized weakness: acute illness or injury with systemic symptoms that poses a threat to life or bodily functions Hypoalbuminemia: chronic illness or injury Malignant neoplasm of lower third of esophagus (HCC): chronic illness or injury that poses a threat to life or bodily functions  Amount and/or Complexity of Data Reviewed Independent Historian:     Details: Ems, family External Data Reviewed: notes. Labs: ordered. Decision-making details documented in ED Course. Radiology: ordered and independent interpretation performed. Decision-making details documented in ED Course.  Risk Prescription drug management. Decision regarding hospitalization.   Iv ns. Continuous pulse ox and cardiac monitoring. Labs ordered/sent. Imaging ordered.   Differential diagnosis includes dehydration, aki, brain mets, progressive cancer, etc. Dispo decision including potential need for admission considered -  will get labs and imaging and reassess.   Reviewed nursing notes and prior charts for additional history. External reports reviewed. Additional history from: family, ems.   Cardiac monitor: sinus rhythm, rate 70.  LR bolus.   Labs reviewed/interpreted by me - wbc  not elev, similar to prior. Hct 35. Chem largely unremarkable. Ua neg for uti.    CT reviewed/interpreted by me - no mets/acute process.   Xray reviewed/interpreted by me - no pna.  Recheck, fully awake, alert. No distress. Pt/fam request d/c.   Pt currently appears stable for d/c.  Rec close pcp/onc f/u.  Return precautions provided.           Final Clinical Impression(s) / ED Diagnoses Final diagnoses:  Malignant neoplasm of lower third of esophagus (HCC)  Generalized weakness    Rx / DC Orders ED Discharge Orders     None         Cathren Laine, MD 10/03/23 2034

## 2023-10-03 NOTE — ED Triage Notes (Signed)
 Pt hx of cancer, last treatment 2 weeks ago- little to no PO intake, pt usually able to care for self at home, starting yesterday pt has not been able to walk, and lethargic. Cbg 187 Initial pressure 80/40 LR from EMS next pressure 99/66

## 2023-10-03 NOTE — Discharge Instructions (Addendum)
 It was our pleasure to provide your ER care today - we hope that you feel better.  Drink plenty of fluids/stay well hydrated. Minimize use of sedating medications.   Follow up with your primary care doctor and/or oncologist, as well as with palliative care in the coming week.    Return to ER if worse, new symptoms, fevers, new/severe pain, persistent vomiting, new or worsening or severe abdominal pain, weak/fainting, trouble breathing, or other concern.

## 2023-10-04 ENCOUNTER — Telehealth: Payer: Self-pay

## 2023-10-04 ENCOUNTER — Inpatient Hospital Stay: Payer: Medicaid Other

## 2023-10-04 NOTE — Transitions of Care (Post Inpatient/ED Visit) (Signed)
   10/04/2023  Name: Jesus Haynes MRN: [id number] DOB: February 10, 1976  Today's TOC FU Call Status: Today's TOC FU Call Status:: Unsuccessful Call (1st Attempt) Unsuccessful Call (1st Attempt) Date: 10/04/23  Attempted to reach the patient regarding the most recent Inpatient/ED visit.  Follow Up Plan: Additional outreach attempts will be made to reach the patient to complete the Transitions of Care (Post Inpatient/ED visit) call.   Signature Renelda Loma RMA

## 2023-10-07 ENCOUNTER — Other Ambulatory Visit: Payer: Self-pay | Admitting: Oncology

## 2023-10-07 DIAGNOSIS — C160 Malignant neoplasm of cardia: Secondary | ICD-10-CM

## 2023-10-07 DIAGNOSIS — R627 Adult failure to thrive: Secondary | ICD-10-CM

## 2023-10-08 ENCOUNTER — Telehealth: Payer: Self-pay | Admitting: Oncology

## 2023-10-08 NOTE — Progress Notes (Signed)
 [facility] CSW Progress Note  Clinical Child psychotherapist was contacted by patients palliative nurse for assistance with obtaining insurance card for DME.  Cancer Center Scheduling staff unable to locate copy in chart. CSW contacted [employer] contact on this date via email for assistance. CSW contact Medicaid DSS contact on this date via email for assistance.  CSW attempted to reach patient's listed Managed Medicaid company [employer] for further assistance, unsuccessful.   CSW referred patient to [employer] to contact on their own and/or visit DSS for further assistance. CSW will updated patient and medical team once information is gained.   Marguerita Merles, LCSW Clinical Social Worker [facility]  Patient is participating in a Managed Medicaid Plan:  Yes

## 2023-10-08 NOTE — Telephone Encounter (Signed)
 Patient's relative Nicholaus Bloom) scheduled appointments. Patient is aware of all appointment details.

## 2023-10-09 ENCOUNTER — Inpatient Hospital Stay (HOSPITAL_BASED_OUTPATIENT_CLINIC_OR_DEPARTMENT_OTHER): Admitting: Oncology

## 2023-10-09 ENCOUNTER — Other Ambulatory Visit: Payer: Self-pay

## 2023-10-09 ENCOUNTER — Telehealth: Payer: Self-pay | Admitting: Oncology

## 2023-10-09 ENCOUNTER — Other Ambulatory Visit (HOSPITAL_COMMUNITY): Payer: Self-pay

## 2023-10-09 ENCOUNTER — Inpatient Hospital Stay

## 2023-10-09 VITALS — BP 90/65 | HR 57 | Temp 97.8°F | Resp 15 | Wt 136.7 lb

## 2023-10-09 DIAGNOSIS — Z515 Encounter for palliative care: Secondary | ICD-10-CM

## 2023-10-09 DIAGNOSIS — C160 Malignant neoplasm of cardia: Secondary | ICD-10-CM

## 2023-10-09 DIAGNOSIS — R530 Neoplastic (malignant) related fatigue: Secondary | ICD-10-CM

## 2023-10-09 DIAGNOSIS — R627 Adult failure to thrive: Secondary | ICD-10-CM

## 2023-10-09 DIAGNOSIS — Z95828 Presence of other vascular implants and grafts: Secondary | ICD-10-CM

## 2023-10-09 LAB — CMP (CANCER CENTER ONLY)
ALT: 23 U/L (ref 0–44)
AST: 26 U/L (ref 15–41)
Albumin: 3 g/dL — ABNORMAL LOW (ref 3.5–5.0)
Alkaline Phosphatase: 55 U/L (ref 38–126)
Anion gap: 5 (ref 5–15)
BUN: 6 mg/dL (ref 6–20)
CO2: 31 mmol/L (ref 22–32)
Calcium: 8.2 mg/dL — ABNORMAL LOW (ref 8.9–10.3)
Chloride: 99 mmol/L (ref 98–111)
Creatinine: 0.46 mg/dL — ABNORMAL LOW (ref 0.61–1.24)
GFR, Estimated: >60 mL/min (ref >60)
Glucose, Bld: 96 mg/dL (ref 70–99)
Potassium: 3 mmol/L — ABNORMAL LOW (ref 3.5–5.1)
Sodium: 135 mmol/L (ref 135–145)
Total Bilirubin: 0.6 mg/dL (ref 0.0–1.2)
Total Protein: 5.6 g/dL — ABNORMAL LOW (ref 6.5–8.1)

## 2023-10-09 LAB — CBC WITH DIFFERENTIAL (CANCER CENTER ONLY)
Abs Immature Granulocytes: 0.03 10*3/uL (ref 0.00–0.07)
Basophils Absolute: 0 10*3/uL (ref 0.0–0.1)
Basophils Relative: 1 %
Eosinophils Absolute: 0.1 10*3/uL (ref 0.0–0.5)
Eosinophils Relative: 2 %
HCT: 33.7 % — ABNORMAL LOW (ref 39.0–52.0)
Hemoglobin: 10.8 g/dL — ABNORMAL LOW (ref 13.0–17.0)
Immature Granulocytes: 1 %
Lymphocytes Relative: 35 %
Lymphs Abs: 1.4 10*3/uL (ref 0.7–4.0)
MCH: 23.6 pg — ABNORMAL LOW (ref 26.0–34.0)
MCHC: 32 g/dL (ref 30.0–36.0)
MCV: 73.7 fL — ABNORMAL LOW (ref 80.0–100.0)
Monocytes Absolute: 0.5 10*3/uL (ref 0.1–1.0)
Monocytes Relative: 12 %
Neutro Abs: 2 10*3/uL (ref 1.7–7.7)
Neutrophils Relative %: 49 %
Platelet Count: 215 10*3/uL (ref 150–400)
RBC: 4.57 MIL/uL (ref 4.22–5.81)
RDW: 22.3 % — ABNORMAL HIGH (ref 11.5–15.5)
WBC Count: 4 10*3/uL (ref 4.0–10.5)
nRBC: 0 % (ref 0.0–0.2)

## 2023-10-09 LAB — CORTISOL: Cortisol, Plasma: 10.3 ug/dL

## 2023-10-09 LAB — MAGNESIUM: Magnesium: 1.9 mg/dL (ref 1.7–2.4)

## 2023-10-09 LAB — TSH: TSH: 1.157 u[IU]/mL (ref 0.350–4.500)

## 2023-10-09 MED ORDER — SODIUM CHLORIDE 0.9% FLUSH
10.0000 mL | Freq: Once | INTRAVENOUS | Status: AC
  Administered 2023-10-09: 10 mL

## 2023-10-09 MED ORDER — POTASSIUM CHLORIDE 20 MEQ PO PACK
20.0000 meq | PACK | Freq: Two times a day (BID) | ORAL | 3 refills | Status: DC
  Filled 2023-10-09: qty 60, 30d supply, fill #0
  Filled 2023-10-30: qty 41, 21d supply, fill #0

## 2023-10-09 MED ORDER — HEPARIN SOD (PORK) LOCK FLUSH 100 UNIT/ML IV SOLN
500.0000 [IU] | Freq: Once | INTRAVENOUS | Status: AC
Start: 2023-10-09 — End: 2023-10-09
  Administered 2023-10-09: 500 [IU]

## 2023-10-09 MED ORDER — HYDROCORTISONE 10 MG PO TABS
ORAL_TABLET | ORAL | 1 refills | Status: DC
  Filled 2023-10-09: qty 50, 23d supply, fill #0

## 2023-10-09 NOTE — Progress Notes (Signed)
 Patient here for port flush/labs.  [facility] lab is to be drawn between 7-10 A per lab.  Secure chatted Dr. Arlana Pouch and Eileen/RN.  Dr. Arlana Pouch states will reorder test in the future.  Lab informed to disregard order.

## 2023-10-09 NOTE — Telephone Encounter (Signed)
 I informed Nicholaus Bloom that we were able to accommodate appointments for 4/24 as Denny Peon is able to do Tionne's port flush in infusion instead of flush.

## 2023-10-10 LAB — PROLACTIN: Prolactin: 101 ng/mL — ABNORMAL HIGH (ref 3.9–22.7)

## 2023-10-10 LAB — FOLLICLE STIMULATING HORMONE: FSH: 15.9 m[IU]/mL — ABNORMAL HIGH (ref 1.5–12.4)

## 2023-10-10 LAB — T4: T4, Total: 8.5 ug/dL (ref 4.5–12.0)

## 2023-10-10 LAB — LUTEINIZING HORMONE: LH: 4.1 m[IU]/mL (ref 1.7–8.6)

## 2023-10-11 ENCOUNTER — Telehealth: Payer: Self-pay

## 2023-10-11 ENCOUNTER — Encounter: Payer: Self-pay | Admitting: Oncology

## 2023-10-11 ENCOUNTER — Other Ambulatory Visit (HOSPITAL_COMMUNITY): Payer: Self-pay

## 2023-10-11 DIAGNOSIS — R5383 Other fatigue: Secondary | ICD-10-CM | POA: Insufficient documentation

## 2023-10-11 NOTE — Progress Notes (Signed)
 [facility]  ONCOLOGY CLINIC PROGRESS NOTE   Patient Care Team: Ivonne Andrew, NP as PCP - General (Pulmonary Disease) Meryl Crutch, MD as Consulting Physician (Oncology) Vida Rigger, MD as Consulting Physician (Gastroenterology) Pickenpack-Cousar, Arty Baumgartner, NP as Nurse Practitioner [facility] and Palliative Medicine)  PATIENT NAME: Jesus Haynes   MR#: [id number] DOB: 1976/03/28  Date of visit: 10/09/2023   ASSESSMENT & PLAN:   Jesus Haynes is a 48 y.o. pleasant gentleman with history of asthma, presented to the ED on 05/27/2023 with complaints of epigastric burning abdominal pain, nausea, retching.  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  Adenocarcinoma of gastroesophageal junction (HCC) -Please review HPI/oncology history for additional details and timeline of events.  -Reviewed staging PET/CT findings with the patient previously.  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  Discussed treatment options. All treatment options are palliative in nature and not curative intent. Plan made to proceed with palliative systemic treatment using FOLFOX plus nivolumab.   -He started palliative systemic treatments with FOLFOX plus nivolumab from 07/23/2023.  He was admitted to the hospital on 07/30/2023 after he presented with chest and epigastric pain, episodes of melena. He was found to have hemoglobin of 6.8. He was admitted for blood transfusion and further evaluation.  GI deferred additional workup/EGD since the cause of blood loss seemed obvious from malignancy. He was discharged home on 08/02/2023.   -He resumed systemic treatments with cycle 2 of FOLFOX plus nivolumab on 08/07/2023 without 5-FU bolus and a 20% dose reduction of  oxaliplatin and tolerated the chemotherapy and immunotherapy reasonably well.  Did not experience major side effects.  Recent lower extremity ultrasound to rule out DVT showed possible inguinal lymphadenopathy.  Hence PET/CT was obtained for further evaluation on 09/10/2023.  It showed significant improvement with decreased size and metabolic some of the gastric mass.  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  Overall PET scan showing encouraging results.  Hence plan made to continue current management with dose reduced FOLFOX and nivolumab.  Last chemotherapy with FOLFOX and nivolumab was on 09/18/2023.  He presented to clinic today for reevaluation as he has not been eating very well and has had progressive decline in his performance status and ECOG is 3 today.   We will reassess in approximately 2 weeks.  Labs otherwise reveal no dose-limiting toxicities.  Potassium is decreased at 3.  This will be replaced.  RTC in 2 weeks for possible resumption of chemoimmunotherapy. If performance status and blood counts are normalized, plan to resume treatment at same dose.  Otherwise we can skip oxaliplatin and continue with 5-FU and nivolumab treatments.  Following cycle 6, plan is to transition him to maintenance treatments with 5-FU and nivolumab.  This will be continued every 2 weeks.  Only if intolerable side effects are noted to chemotherapy, then we will transition him to maintenance treatment with immunotherapy alone.   Fatigue Experiencing worsening weakness and fatigue, possibly due to a pituitary gland issue related to immunotherapy, which can cause endocrine and hormonal disturbances. Blood tests are pending. Blood pressure and heart rate are low. A trial of steroids is planned to assess improvement in energy levels. If pituitary or hormonal issues are confirmed, immunotherapy may be discontinued, and chemotherapy continued alone. - Prescribe hydrocortisone 10 mg tablets:  take two in  the morning and one at night for one week, then one tablet twice daily until further instructions. - Evaluate response to steroids in two weeks. - Consider discontinuing immunotherapy if pituitary or hormonal issues are confirmed.     I reviewed lab results and outside records for this visit and discussed relevant results with the patient. Diagnosis, plan of care and treatment options were also discussed in detail with the patient. Opportunity provided to ask questions and answers provided to his apparent satisfaction. Provided instructions to call our clinic with any problems, questions or concerns prior to return visit. I recommended to continue follow-up with PCP and sub-specialists. He verbalized understanding and agreed with the plan.   NCCN guidelines have been consulted in the planning of this patient's care.  I spent a total of 40 minutes during this encounter with the patient including review of chart and various tests results, discussions about plan of care and coordination of care plan.   Meryl Crutch, MD   [facility] - A DEPT OF [facility] 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  INTERVAL HISTORY:    Discussed the use of AI scribe software for clinical note transcription with the patient, who gave verbal consent to proceed.   Jesus Haynes is here today for repeat clinical assessment.    He has been experiencing worsening weakness and fatigue over the past few days, leading to increased time spent in bed, although he has been more active in the past two days.  He has low blood pressure, recorded at 90/65, and a heart  rate of 57.  He is experiencing confusion, evidenced by delusions such as believing there is an elevator in his house and another family living there.  Recent blood tests show normalized white blood cell count after being low last week, hemoglobin at 10.8, normal platelets, normal magnesium, but low potassium at 3. Kidney and liver function tests are within normal limits. A CT scan of the brain was performed to rule out metastasis to the brain, which was normal.  He is currently on hydrocortisone, taking 10 mg tablets: two in the morning and one at night for a week, then reducing to one tablet twice daily. He was previously on Lasix but has been discontinued over the weekend. There is mention of Prudoxin, but it is unclear if he is currently taking it.  I have reviewed the past medical history, past surgical history, social history and family history with the patient and they are unchanged from previous note.  HISTORY OF PRESENT ILLNESS:   Oncology History  Adenocarcinoma of gastroesophageal junction (HCC)  05/29/2023 Initial Diagnosis   Adenocarcinoma of gastroesophageal junction (HCC)   07/18/2023 Cancer Staging   Staging form: Esophagus - Adenocarcinoma, AJCC 8th Edition - Clinical: Stage IVB (cT4, cN3, cM1, G3) - Signed by Meryl Crutch, MD on 07/18/2023 Histologic grading system: 3 grade system   07/23/2023 -  Chemotherapy   Patient is on Treatment Plan : GASTROESOPHAGEAL FOLFOX + Nivolumab q14d       48 y.o. gentleman with history of asthma, presented to the ED on 05/27/2023 with complaints of epigastric burning abdominal pain, nausea, retching.  He also reported some dark tarry stools for 3 days prior to arrival.  He was previously seen in the ED at [facility] on [facility]/08/2023 with complaints of chest or right upper quadrant abdominal pain.  Workup was unremarkable at that time and he was sent home with Protonix and Carafate.  Since his symptoms persisted, he presented to the ED again.    In the ED, his hemoglobin was noted to be down to 10, compared to 13 previously.  With concern for upper GI bleed, was admitted for further evaluation and management.   Dr. Ewing Schlein performed upper GI endoscopy on 05/28/2023.  It showed partially obstructing, likely malignant esophageal tumor in the lower third of the esophagus.  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  Normal duodenum.  Pathology from GE junction mass came back positive for invasive adenocarcinoma, moderately to poorly differentiated.  Immunostains pending.   CT chest abdomen pelvis on 05/28/2023 showed abnormal wall thickening at the GE junction, compatible with patient's known carcinoma.  Prominent lymph nodes in the retrocrural region on the right side, adjacent to the hiatal hernia.  Abnormal low-density soft tissue in the gastrohepatic region, abutting the lesser curvature and proximal stomach, worrisome for metastatic disease.  Periportal, periceliac, gastrosplenic and left retroperitoneal lymphadenopathy, worrisome for metastatic involvement.  Hypodensities in the liver were also noted, worrisome for metastatic disease, largest 1 measuring 13 mm in the inferior right lobe.  Fat stranding surrounding the body and tail of pancreas, suggesting acute pancreatitis.   We were consulted during his hospitalization on 05/29/2023 for additional recommendations given new diagnosis of GE junction adenocarcinoma.   On 06/07/2023, staging PET/CT showed large hypermetabolic mass involving the distal esophagus and proximal stomach consistent with known primary esophageal carcinoma.  Multiple hypermetabolic retroperitoneal, lower mediastinal lymph nodes consistent with locally advanced disease.  Suspected direct tumor extension into the pancreatic body.  Hypermetabolic node or peritoneal implant within the omentum.  No other evidence of metastatic disease.   Plan was for palliative systemic treatments with FOLFOX plus nivolumab.  However  patient's performance status continued to decline and he had to be hospitalized again on 06/14/2023.  He finally presented to reestablish care in our clinic on 07/18/2023.  His performance status improved with nutrition via J-tube.  Started systemic treatments from 07/23/2023.  We did dose reduce oxaliplatin by 20% and proceeded with 68 mg/m dose.  When he presented for cycle 2 on 08/07/2023, leukopenia with white count of 3300, ANC 1100.  We started skipping 5-FU bolus and continue with the dose reduced oxaliplatin.  Restaging PET scan on 09/10/2023 showed excellent response with significant reduction in size and metabolism in the gastric mass.  Resolution of peritoneal metastatic disease.    REVIEW OF SYSTEMS:   Review of Systems - Oncology  All other pertinent systems were reviewed with the patient and are negative.  ALLERGIES: He has no known allergies.  MEDICATIONS:  Current Outpatient Medications  Medication Sig Dispense Refill   cyanocobalamin 1000 MCG tablet Take 1 tablet (1,000 mcg total) by mouth daily. 30 tablet 2   folic acid (FOLVITE) 1 MG tablet Take 1 tablet (1 mg total) by mouth daily. 30 tablet 2   furosemide (LASIX) 20 MG tablet Take 1 tablet (20 mg total) by mouth daily as needed for fluid or edema. 30 tablet 3   hydrocortisone (CORTEF) 10 MG tablet Take 2 tablets by mouth every morning and 1 tablet every evening for one week, then one tablet twice daily 50  tablet 1   HYDROmorphone (DILAUDID) 2 MG tablet Take 1 tablet (2 mg total) by mouth every 6 (six) hours as needed for severe pain (pain score 7-10). 60 tablet 0   methadone (DOLOPHINE) 5 MG tablet Take 1 tablet (5 mg total) by mouth daily. 30 tablet 0   metoCLOPramide (REGLAN) 10 MG tablet Take 1 tablet (10 mg total) by mouth 4 (four) times daily  before meals and at bedtime. 120 tablet 3   ondansetron (ZOFRAN-ODT) 8 MG disintegrating tablet Dissolve 1 tablet (8 mg total) by mouth every 8 (eight) hours as needed for  nausea or vomiting. 30 tablet 3   pantoprazole (PROTONIX) 40 MG tablet Take 1 tablet (40 mg total) by mouth 2 (two) times daily. 60 tablet 1   prochlorperazine (COMPAZINE) 10 MG tablet Take 1 tablet (10 mg total) by mouth every 6 (six) hours as needed for nausea or vomiting. 30 tablet 1   sucralfate (CARAFATE) 1 GM/10ML suspension Take 10 mLs (1 g total) by mouth 4 (four) times daily -  with meals and at bedtime. 1200 mL 2   traZODone (DESYREL) 50 MG tablet Take 1 tablet (50 mg total) by mouth at bedtime. 30 tablet 3   Water For Irrigation, Sterile (FREE WATER) SOLN Place 100 mLs into feeding tube 4 (four) times daily. 12000 mL 1   albuterol (PROVENTIL) (2.5 MG/3ML) 0.083% nebulizer solution Take 3 mLs (2.5 mg total) by nebulization every 6 (six) hours as needed for wheezing or shortness of breath. (Patient not taking: Reported on 10/09/2023) 75 mL 12   albuterol (VENTOLIN HFA) 108 (90 Base) MCG/ACT inhaler Inhale 1-2 puffs into the lungs every 4 (four) hours as needed for wheezing or shortness of breath. (Patient not taking: Reported on 10/09/2023) 18 g 3   Albuterol Sulfate 2.5 MG/0.5ML NEBU Use 1 vial in nebulizer as directed every 6 hours as needed for wheezing or shortness of breath (Patient not taking: Reported on 10/09/2023) 30 mL 3   dronabinol (MARINOL) 5 MG capsule Take 1 capsule (5 mg total) by mouth 2 (two) times daily before lunch and supper. (Patient not taking: Reported on 10/09/2023) 60 capsule 1   feeding supplement (ENSURE ENLIVE / ENSURE PLUS) LIQD Take as directed (Patient not taking: Reported on 10/09/2023) 948 mL 12   lidocaine-prilocaine (EMLA) cream Apply to affected area once (Patient not taking: Reported on 10/09/2023) 30 g 3   Nutritional Supplements (FEEDING SUPPLEMENT, OSMOLITE 1.5 CAL,) LIQD At 60 mL/h over 18 hours on a daily basis. (Patient not taking: Reported on 10/09/2023) 30000 mL 0   polyethylene glycol powder (GLYCOLAX/MIRALAX) 17 GM/SCOOP powder Dissolve 17 g in 4 oz of liquid  and take by mouth daily. (Patient not taking: Reported on 10/09/2023) 476 g 0   potassium chloride (KLOR-CON) 20 MEQ packet Take 20 mEq by mouth 2 (two) times daily. 60 packet 3   senna-docusate (SENOKOT-S) 8.6-50 MG tablet Take 2 tablets by mouth at bedtime. (Patient not taking: Reported on 10/09/2023) 60 tablet 2   No current facility-administered medications for this visit.     VITALS:   Blood pressure 90/65, pulse (!) 57, temperature 97.8 F (36.6 C), temperature source Temporal, resp. rate 15, weight 136 lb 11.2 oz (62 kg), SpO2 99%.  Wt Readings from Last 3 Encounters:  10/09/23 136 lb 11.2 oz (62 kg)  10/03/23 138 lb 14.2 oz (63 kg)  10/02/23 138 lb 14.4 oz (63 kg)    Body mass index is 22.06 kg/m.  PHYSICAL EXAM:   Physical Exam Constitutional:      General: He is not in acute distress.    Appearance: Normal appearance.  HENT:     Head: Normocephalic and atraumatic.  Eyes:     General: No scleral icterus.    Conjunctiva/sclera: Conjunctivae normal.  Cardiovascular:     Rate and Rhythm: Normal rate and regular rhythm.     Heart sounds: Normal heart sounds.  Pulmonary:     Effort: Pulmonary effort is normal.     Breath sounds: Normal breath sounds.  Chest:     Comments: Port-A-Cath in place without signs of infection Abdominal:     General: There is no distension.     Comments: J-tube in place  Musculoskeletal:     Right lower leg: Edema present.     Left lower leg: Edema present.  Lymphadenopathy:     Cervical: No cervical adenopathy.  Neurological:     General: No focal deficit present.     Mental Status: He is oriented to person, place, and time.  Psychiatric:        Mood and Affect: Mood normal.        Behavior: Behavior normal.      LABORATORY DATA:   I have reviewed the data as listed.  Results for orders placed or performed in visit on 10/09/23  Magnesium  Result Value Ref Range   Magnesium 1.9 1.7 - 2.4 mg/dL  CMP (Cancer Center  only)  Result Value Ref Range   Sodium 135 135 - 145 mmol/L   Potassium 3.0 (L) 3.5 - 5.1 mmol/L   Chloride 99 98 - 111 mmol/L   CO2 31 22 - 32 mmol/L   Glucose, Bld 96 70 - 99 mg/dL   BUN 6 6 - 20 mg/dL   Creatinine 1.61 (L) 0.96 - 1.24 mg/dL   Calcium 8.2 (L) 8.9 - 10.3 mg/dL   Total Protein 5.6 (L) 6.5 - 8.1 g/dL   Albumin 3.0 (L) 3.5 - 5.0 g/dL   AST 26 15 - 41 U/L   ALT 23 0 - 44 U/L   Alkaline Phosphatase 55 38 - 126 U/L   Total Bilirubin 0.6 0.0 - 1.2 mg/dL   GFR, Estimated >04 >54 mL/min   Anion gap 5 5 - 15  CBC with Differential (Cancer Center Only)  Result Value Ref Range   WBC Count 4.0 4.0 - 10.5 K/uL   RBC 4.57 4.22 - 5.81 MIL/uL   Hemoglobin 10.8 (L) 13.0 - 17.0 g/dL   HCT 09.8 (L) 11.9 - 14.7 %   MCV 73.7 (L) 80.0 - 100.0 fL   MCH 23.6 (L) 26.0 - 34.0 pg   MCHC 32.0 30.0 - 36.0 g/dL   RDW 82.9 (H) 56.2 - 13.0 %   Platelet Count 215 150 - 400 K/uL   nRBC 0.0 0.0 - 0.2 %   Neutrophils Relative % 49 %   Neutro Abs 2.0 1.7 - 7.7 K/uL   Lymphocytes Relative 35 %   Lymphs Abs 1.4 0.7 - 4.0 K/uL   Monocytes Relative 12 %   Monocytes Absolute 0.5 0.1 - 1.0 K/uL   Eosinophils Relative 2 %   Eosinophils Absolute 0.1 0.0 - 0.5 K/uL   Basophils Relative 1 %   Basophils Absolute 0.0 0.0 - 0.1 K/uL   Immature Granulocytes 1 %   Abs Immature Granulocytes 0.03 0.00 - 0.07 K/uL  T4  Result Value Ref Range   T4, Total 8.5 4.5 - 12.0  ug/dL  TSH  Result Value Ref Range   TSH 1.157 0.350 - 4.500 uIU/mL  Cortisol  Result Value Ref Range   Cortisol, Plasma 10.3 ug/dL  Prolactin  Result Value Ref Range   Prolactin 101.0 (H) 3.9 - 22.7 ng/mL  LH-Luteinizing hormone  Result Value Ref Range   LH 4.1 1.7 - 8.6 mIU/mL  FSH-Follicle stimulating hormone  Result Value Ref Range   FSH 15.9 (H) 1.5 - 12.4 mIU/mL      RADIOGRAPHIC STUDIES:  I have personally reviewed the radiological images as listed and agree with the findings in the report.  CT HEAD W & WO CONTRAST  ( ) Result Date: 10/03/2023 CLINICAL DATA:  Altered mental status and suspected brain metastases EXAM: CT HEAD WITHOUT AND WITH CONTRAST TECHNIQUE: Contiguous axial images were obtained from the base of the skull through the vertex without and with intravenous contrast. RADIATION DOSE REDUCTION: This exam was performed according to the departmental dose-optimization program which includes automated exposure control, adjustment of the mA and/or kV according to patient size and/or use of iterative reconstruction technique. CONTRAST:  75mL OMNIPAQUE IOHEXOL 300 MG/ML  SOLN COMPARISON:  07/03/2023 FINDINGS: Brain: No mass,hemorrhage or extra-axial collection. Normal appearance of the parenchyma and CSF spaces. No abnormal enhancement. Vascular: No hyperdense vessel or unexpected vascular calcification. Skull: The visualized skull base, calvarium and extracranial soft tissues are normal. Sinuses/Orbits: No fluid levels or advanced mucosal thickening of the visualized paranasal sinuses. No mastoid or middle ear effusion. Normal orbits. Other: None. IMPRESSION: Normal head CT. Electronically Signed   By: Deatra Robinson M.D.   On: 10/03/2023 20:23   DG Chest Port 1 View Result Date: 10/03/2023 CLINICAL DATA:  Weakness. EXAM: PORTABLE CHEST 1 VIEW COMPARISON:  Chest radiograph dated 07/30/2023. FINDINGS: Right-sided Port-A-Cath with tip at cavoatrial junction. No focal consolidation, pleural effusion, pneumothorax. The cardiac silhouette is within normal limits. No acute osseous pathology. IMPRESSION: No active disease. Electronically Signed   By: Elgie Collard M.D.   On: 10/03/2023 17:37    CODE STATUS:  Code Status History     Date Active Date Inactive Code Status Order ID Comments User Context   06/14/2023 1621 07/11/2023 2225 Full Code [id number]  Bobette Mo, MD Inpatient   06/13/2023 1253 06/14/2023 0509 Full Code [id number]  Richarda Overlie, MD [facility]   05/27/2023 1516 05/30/2023 1651 Full Code [id number]   Maryln Gottron, MD ED    Questions for Most Recent Historical Code Status (Order [id number])     Question Answer   By: Consent: discussion documented in EHR            No orders of the defined types were placed in this encounter.    Future Appointments  Date Time Provider Department Center  10/17/2023  1:45 PM Anabel Bene, RD [facility] None  10/21/2023  9:15 AM [facility] None  10/24/2023  8:45 AM [facility] INFUSION [facility] None  10/24/2023  9:30 AM Javiana Anwar, MD [facility] None  10/24/2023 10:00 AM [facility] INFUSION [facility] None  10/26/2023 11:30 AM [facility] FLUSH [facility] None     This document was completed utilizing speech recognition software. Grammatical errors, random word insertions, pronoun errors, and incomplete sentences are an occasional consequence of this system due to software limitations, ambient noise, and hardware issues. Any formal questions or concerns about the content, text or information contained within the body of this dictation should be directly addressed to the provider for clarification.

## 2023-10-11 NOTE — Assessment & Plan Note (Signed)
 Experiencing worsening weakness and fatigue, possibly due to a pituitary gland issue related to immunotherapy, which can cause endocrine and hormonal disturbances. Blood tests are pending. Blood pressure and heart rate are low. A trial of steroids is planned to assess improvement in energy levels. If pituitary or hormonal issues are confirmed, immunotherapy may be discontinued, and chemotherapy continued alone. - Prescribe hydrocortisone 10 mg tablets: take two in the morning and one at night for one week, then one tablet twice daily until further instructions. - Evaluate response to steroids in two weeks. - Consider discontinuing immunotherapy if pituitary or hormonal issues are confirmed.

## 2023-10-11 NOTE — Transitions of Care (Post Inpatient/ED Visit) (Signed)
   10/11/2023  Name: Jesus Haynes MRN: [id number] DOB: 1975/10/02  Today's TOC FU Call Status: Today's TOC FU Call Status:: Unsuccessful Call (2nd Attempt) Unsuccessful Call (2nd Attempt) Date: 10/11/23  Attempted to reach the patient regarding the most recent Inpatient/ED visit.  Follow Up Plan: Additional outreach attempts will be made to reach the patient to complete the Transitions of Care (Post Inpatient/ED visit) call.   Signature Renelda Loma RMA

## 2023-10-11 NOTE — Assessment & Plan Note (Addendum)
-  Please review HPI/oncology history for additional details and timeline of events.  -Reviewed staging PET/CT findings with the patient previously.  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  Discussed treatment options. All treatment options are palliative in nature and not curative intent. Plan made to proceed with palliative systemic treatment using FOLFOX plus nivolumab.   -He started palliative systemic treatments with FOLFOX plus nivolumab from 07/23/2023.  He was admitted to the hospital on 07/30/2023 after he presented with chest and epigastric pain, episodes of melena. He was found to have hemoglobin of 6.8. He was admitted for blood transfusion and further evaluation.  GI deferred additional workup/EGD since the cause of blood loss seemed obvious from malignancy. He was discharged home on 08/02/2023.   -He resumed systemic treatments with cycle 2 of FOLFOX plus nivolumab on 08/07/2023 without 5-FU bolus and a 20% dose reduction of oxaliplatin and tolerated the chemotherapy and immunotherapy reasonably well.  Did not experience major side effects.  Recent lower extremity ultrasound to rule out DVT showed possible inguinal lymphadenopathy.  Hence PET/CT was obtained for further evaluation on 09/10/2023.  It showed significant improvement with decreased size and metabolic some of the gastric mass.  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  Overall PET scan showing encouraging results.  Hence plan made to continue current management with dose reduced FOLFOX and nivolumab.  Last chemotherapy with FOLFOX and nivolumab was on 09/18/2023.  He presented to clinic today for reevaluation as he has not been eating very well and has had progressive decline in his performance status and ECOG is 3 today.   We will reassess in approximately 2 weeks.  Labs otherwise reveal no dose-limiting toxicities.  Potassium  is decreased at 3.  This will be replaced.  RTC in 2 weeks for possible resumption of chemoimmunotherapy. If performance status and blood counts are normalized, plan to resume treatment at same dose.  Otherwise we can skip oxaliplatin and continue with 5-FU and nivolumab treatments.  Following cycle 6, plan is to transition him to maintenance treatments with 5-FU and nivolumab.  This will be continued every 2 weeks.  Only if intolerable side effects are noted to chemotherapy, then we will transition him to maintenance treatment with immunotherapy alone.

## 2023-10-14 ENCOUNTER — Other Ambulatory Visit (HOSPITAL_COMMUNITY): Payer: Self-pay

## 2023-10-14 ENCOUNTER — Other Ambulatory Visit: Payer: Self-pay | Admitting: Nurse Practitioner

## 2023-10-14 ENCOUNTER — Encounter: Payer: Self-pay | Admitting: Oncology

## 2023-10-14 ENCOUNTER — Telehealth: Payer: Self-pay

## 2023-10-14 MED ORDER — FOLIC ACID 1 MG PO TABS
1.0000 mg | ORAL_TABLET | Freq: Every day | ORAL | 6 refills | Status: AC
  Filled 2023-10-14: qty 30, 30d supply, fill #0
  Filled 2023-10-30 – 2023-11-07 (×2): qty 30, 30d supply, fill #1
  Filled 2023-12-04: qty 30, 30d supply, fill #2
  Filled 2024-01-02: qty 30, 30d supply, fill #3
  Filled 2024-02-03: qty 30, 30d supply, fill #4
  Filled 2024-03-03 (×2): qty 30, 30d supply, fill #5

## 2023-10-14 MED ORDER — CYANOCOBALAMIN 1000 MCG PO TABS
1000.0000 ug | ORAL_TABLET | Freq: Every day | ORAL | 6 refills | Status: DC
  Filled 2023-10-14: qty 30, 30d supply, fill #0
  Filled 2023-10-30 – 2023-11-07 (×2): qty 30, 30d supply, fill #1
  Filled 2023-12-04: qty 30, 30d supply, fill #2
  Filled 2024-01-02: qty 30, 30d supply, fill #3
  Filled 2024-02-03: qty 30, 30d supply, fill #4
  Filled 2024-03-03: qty 30, 30d supply, fill #5

## 2023-10-14 NOTE — Transitions of Care (Post Inpatient/ED Visit) (Cosign Needed)
   10/14/2023  Name: DELAINE CANTER MRN: [id number] DOB: 09/08/1975  Today's TOC FU Call Status: Today's TOC FU Call Status:: Unsuccessful Call (3rd Attempt) Unsuccessful Call (3rd Attempt) Date: 10/14/23  Attempted to reach the patient regarding the most recent Inpatient/ED visit.  Follow Up Plan: No further outreach attempts will be made at this time. We have been unable to contact the patient.  Signature Julian Obey RMA

## 2023-10-14 NOTE — Progress Notes (Signed)
 Nutrition  Message received from [name], relative requesting nutrition appointment scheduled for 4/17 be moved to 4/24 with other appointments.  Ms Fredna Jasper said on her message that she works at a call center and is not able to take messages unless during lunch break.    Called Ms Fredna Jasper back and left message saying that nutrition would not be able to see patient on 4/24.  Left a message asking if a phone visit would be an option (keeping in mind the lunch hour break) or if nutrition appointment needed to be moved to a different day.  Provided call back number.  Apologized for not being able to see patient on 4/24.    Shia Delaine B. Zollie Hipp, CSO, LDN Registered Dietitian [phone]

## 2023-10-16 ENCOUNTER — Other Ambulatory Visit (HOSPITAL_COMMUNITY): Payer: Self-pay

## 2023-10-17 ENCOUNTER — Ambulatory Visit

## 2023-10-17 ENCOUNTER — Encounter: Admitting: Nutrition

## 2023-10-17 ENCOUNTER — Ambulatory Visit: Admitting: Nurse Practitioner

## 2023-10-17 ENCOUNTER — Ambulatory Visit: Admitting: Oncology

## 2023-10-17 ENCOUNTER — Other Ambulatory Visit

## 2023-10-18 NOTE — Progress Notes (Signed)
  Radiation Oncology         [phone] ________________________________  Name: Jesus Haynes MRN: [id number]  Date of Service: 10/18/2023  DOB: [birth year]  Post Treatment Telephone Note  Diagnosis:  Stage IV, adenocarcinoma of the GE junction  (as documented in provider EOT note)  The patient was not available for call today. Voicemail left. Call marked complete.  The patient has scheduled follow up with his medical oncologist Dr. Randye Buttner for ongoing surveillance, and with Dr. Lurena Sally. The patient was encouraged to call if he develops concerns or questions regarding radiation.    Avery Bodo, LPN

## 2023-10-19 ENCOUNTER — Encounter

## 2023-10-21 ENCOUNTER — Inpatient Hospital Stay
Admission: RE | Admit: 2023-10-21 | Discharge: 2023-10-21 | Disposition: A | Discharge status: Home / Self Care | Source: Ambulatory Visit

## 2023-10-23 ENCOUNTER — Other Ambulatory Visit

## 2023-10-23 ENCOUNTER — Telehealth: Payer: Self-pay | Admitting: Oncology

## 2023-10-23 NOTE — Telephone Encounter (Signed)
 Jesus Haynes called in and left a VM to reschedule Stephanos's appointments for next Wednesday. I will return her call.

## 2023-10-24 ENCOUNTER — Telehealth: Payer: Self-pay

## 2023-10-24 ENCOUNTER — Other Ambulatory Visit (HOSPITAL_COMMUNITY): Payer: Self-pay

## 2023-10-24 ENCOUNTER — Ambulatory Visit: Admitting: Oncology

## 2023-10-24 ENCOUNTER — Other Ambulatory Visit

## 2023-10-24 ENCOUNTER — Inpatient Hospital Stay

## 2023-10-24 ENCOUNTER — Inpatient Hospital Stay: Admitting: Oncology

## 2023-10-24 ENCOUNTER — Inpatient Hospital Stay: Admitting: Dietician

## 2023-10-24 NOTE — Telephone Encounter (Signed)
 Spoke with [name] via telephone and requested that Jesus Haynes get tested for Covid and Flu today in order to help the care team make plans for future appointments.   Patient currently has a cough, runny nose, congestion, and generalized body aches.

## 2023-10-26 ENCOUNTER — Encounter

## 2023-10-28 ENCOUNTER — Other Ambulatory Visit: Payer: Self-pay | Admitting: Nurse Practitioner

## 2023-10-28 ENCOUNTER — Other Ambulatory Visit (HOSPITAL_COMMUNITY): Payer: Self-pay

## 2023-10-28 MED ORDER — PANTOPRAZOLE SODIUM 40 MG PO TBEC
40.0000 mg | DELAYED_RELEASE_TABLET | Freq: Two times a day (BID) | ORAL | 1 refills | Status: DC
  Filled 2023-10-28: qty 60, 30d supply, fill #0
  Filled 2023-11-20 – 2023-12-04 (×2): qty 60, 30d supply, fill #1

## 2023-10-30 ENCOUNTER — Inpatient Hospital Stay (HOSPITAL_BASED_OUTPATIENT_CLINIC_OR_DEPARTMENT_OTHER): Admitting: Oncology

## 2023-10-30 ENCOUNTER — Other Ambulatory Visit: Payer: Self-pay

## 2023-10-30 ENCOUNTER — Other Ambulatory Visit

## 2023-10-30 ENCOUNTER — Ambulatory Visit: Admitting: Oncology

## 2023-10-30 ENCOUNTER — Other Ambulatory Visit (HOSPITAL_COMMUNITY): Payer: Self-pay

## 2023-10-30 ENCOUNTER — Inpatient Hospital Stay

## 2023-10-30 VITALS — BP 112/85 | HR 60 | Temp 98.0°F | Resp 16 | Wt 127.2 lb

## 2023-10-30 DIAGNOSIS — Z95828 Presence of other vascular implants and grafts: Secondary | ICD-10-CM

## 2023-10-30 DIAGNOSIS — Z515 Encounter for palliative care: Secondary | ICD-10-CM

## 2023-10-30 DIAGNOSIS — R634 Abnormal weight loss: Secondary | ICD-10-CM

## 2023-10-30 DIAGNOSIS — C160 Malignant neoplasm of cardia: Secondary | ICD-10-CM

## 2023-10-30 DIAGNOSIS — T85528A Displacement of other gastrointestinal prosthetic devices, implants and grafts, initial encounter: Secondary | ICD-10-CM

## 2023-10-30 DIAGNOSIS — G893 Neoplasm related pain (acute) (chronic): Secondary | ICD-10-CM

## 2023-10-30 LAB — CBC WITH DIFFERENTIAL (CANCER CENTER ONLY)
Abs Immature Granulocytes: 0.01 10*3/uL (ref 0.00–0.07)
Basophils Absolute: 0 10*3/uL (ref 0.0–0.1)
Basophils Relative: 1 %
Eosinophils Absolute: 0.2 10*3/uL (ref 0.0–0.5)
Eosinophils Relative: 5 %
HCT: 34.4 % — ABNORMAL LOW (ref 39.0–52.0)
Hemoglobin: 11.1 g/dL — ABNORMAL LOW (ref 13.0–17.0)
Immature Granulocytes: 0 %
Lymphocytes Relative: 46 %
Lymphs Abs: 1.8 10*3/uL (ref 0.7–4.0)
MCH: 24.7 pg — ABNORMAL LOW (ref 26.0–34.0)
MCHC: 32.3 g/dL (ref 30.0–36.0)
MCV: 76.6 fL — ABNORMAL LOW (ref 80.0–100.0)
Monocytes Absolute: 0.4 10*3/uL (ref 0.1–1.0)
Monocytes Relative: 11 %
Neutro Abs: 1.5 10*3/uL — ABNORMAL LOW (ref 1.7–7.7)
Neutrophils Relative %: 37 %
Platelet Count: 235 10*3/uL (ref 150–400)
RBC: 4.49 MIL/uL (ref 4.22–5.81)
RDW: 22.4 % — ABNORMAL HIGH (ref 11.5–15.5)
WBC Count: 3.9 10*3/uL — ABNORMAL LOW (ref 4.0–10.5)
nRBC: 0 % (ref 0.0–0.2)

## 2023-10-30 LAB — CMP (CANCER CENTER ONLY)
ALT: 13 U/L (ref 0–44)
AST: 15 U/L (ref 15–41)
Albumin: 3.4 g/dL — ABNORMAL LOW (ref 3.5–5.0)
Alkaline Phosphatase: 62 U/L (ref 38–126)
Anion gap: 5 (ref 5–15)
BUN: 7 mg/dL (ref 6–20)
CO2: 28 mmol/L (ref 22–32)
Calcium: 8.7 mg/dL — ABNORMAL LOW (ref 8.9–10.3)
Chloride: 104 mmol/L (ref 98–111)
Creatinine: 0.55 mg/dL — ABNORMAL LOW (ref 0.61–1.24)
GFR, Estimated: >60 mL/min (ref >60)
Glucose, Bld: 123 mg/dL — ABNORMAL HIGH (ref 70–99)
Potassium: 3.4 mmol/L — ABNORMAL LOW (ref 3.5–5.1)
Sodium: 137 mmol/L (ref 135–145)
Total Bilirubin: 0.4 mg/dL (ref 0.0–1.2)
Total Protein: 6.2 g/dL — ABNORMAL LOW (ref 6.5–8.1)

## 2023-10-30 MED ORDER — SODIUM CHLORIDE 0.9% FLUSH
10.0000 mL | Freq: Once | INTRAVENOUS | Status: AC
  Administered 2023-10-30: 10 mL

## 2023-10-30 MED ORDER — METHADONE HCL 5 MG PO TABS
5.0000 mg | ORAL_TABLET | Freq: Every day | ORAL | 0 refills | Status: DC
Start: 2023-10-31 — End: 2023-12-04
  Filled 2023-10-31: qty 30, 30d supply, fill #0

## 2023-10-30 MED ORDER — HEPARIN SOD (PORK) LOCK FLUSH 100 UNIT/ML IV SOLN
500.0000 [IU] | Freq: Once | INTRAVENOUS | Status: AC
  Administered 2023-10-30: 500 [IU]

## 2023-10-30 NOTE — Progress Notes (Signed)
 [facility]  ONCOLOGY CLINIC PROGRESS NOTE   Patient Care Team: Jerrlyn Morel, NP as PCP - General (Pulmonary Disease) Arlo Berber, MD as Consulting Physician (Oncology) Ozell Blunt, MD as Consulting Physician (Gastroenterology) Pickenpack-Cousar, Giles Labrum, NP as Nurse Practitioner [facility] and Palliative Medicine)  PATIENT NAME: Jesus Haynes   MR#: [id number] DOB: [birth year]  Date of visit: 10/30/2023   ASSESSMENT & PLAN:   PANTELIS CASTRILLO is a 48 y.o. pleasant gentleman with history of asthma, presented to the ED on 05/27/2023 with complaints of epigastric burning abdominal pain, nausea, retching.  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  Adenocarcinoma of gastroesophageal junction (HCC) -Please review HPI/oncology history for additional details and timeline of events.  -Reviewed staging PET/CT findings with the patient previously.  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  Discussed treatment options. All treatment options are palliative in nature and not curative intent. Plan made to proceed with palliative systemic treatment using FOLFOX plus nivolumab .   -He started palliative systemic treatments with FOLFOX plus nivolumab  from 07/23/2023.  He was admitted to the hospital on 07/30/2023 after he presented with chest and epigastric pain, episodes of melena. He was found to have hemoglobin of 6.8. He was admitted for blood transfusion and further evaluation.  GI deferred additional workup/EGD since the cause of blood loss seemed obvious from malignancy. He was discharged home on 08/02/2023.   -He resumed systemic treatments with cycle 2 of FOLFOX plus nivolumab  on 08/07/2023 without 5-FU bolus and a 20% dose reduction of  oxaliplatin  and tolerated the chemotherapy and immunotherapy reasonably well.  Did not experience major side effects.  Recent lower extremity ultrasound to rule out DVT showed possible inguinal lymphadenopathy.  Hence PET/CT was obtained for further evaluation on 09/10/2023.  It showed significant improvement with decreased size and metabolic some of the gastric mass.  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  Overall PET scan showing encouraging results.  Hence plan made to continue current management with dose reduced FOLFOX and nivolumab .  Last chemotherapy with FOLFOX and nivolumab  was on 09/18/2023.  He has not received treatment in the last five to six weeks due to concerns about immunotherapy causing adverse effects, including altered mental status. Previous PET scan showed remarkable response to treatment. Hormone tests were normal, indicating no pituitary gland involvement. Plan to resume treatment at a lower dosage to prevent fatigue and other side effects, while continuing immunotherapy and the pump. The goal is to maintain progress seen in the PET scan.  - Resume treatment at a lower dosage next week - Coordinate with the infusion room for scheduling  Following cycle 6, plan is to transition him to maintenance treatments with 5-FU and nivolumab .  This will be continued every 2 weeks.  Only if intolerable side effects are noted to chemotherapy, then we will transition him to maintenance treatment with immunotherapy alone.   Dislodged jejunostomy tube The J-tube fell out twice before Easter, and the hole started to close, necessitating a surgical procedure for replacement. However, the decision was made not to replace it as it was only used for medication administration, and he was refusing tube feeds.  Weight loss Significant weight loss noted, likely due to decreased appetite and refusal of tube feeds. Appetite remains low, and he loses appetite quickly.  No issues with  swallowing or bowel movements. Blood work shows improvement in hemoglobin and potassium levels, but weight remains a concern. Encouragement to increase caloric intake and activity to improve strength. - Arrange appointments with a nutritionist - Encourage increased caloric and protein intake - Encourage activity to improve strength   Follow-up He will be scheduled for treatment next week, preferably on Wednesday. Coordination with the infusion room and [facility] is necessary for scheduling and medication management. He will bring all medications to the next appointment for review. - Schedule treatment for next Wednesday - Coordinate with the infusion room for treatment scheduling - Coordinate with [facility] for medication management - Bring all medications to the next appointment for review     I reviewed lab results and outside records for this visit and discussed relevant results with the patient. Diagnosis, plan of care and treatment options were also discussed in detail with the patient. Opportunity provided to ask questions and answers provided to his apparent satisfaction. Provided instructions to call our clinic with any problems, questions or concerns prior to return visit. I recommended to continue follow-up with PCP and sub-specialists. He verbalized understanding and agreed with the plan.   NCCN guidelines have been consulted in the planning of this patient's care.  I spent a total of 40 minutes during this encounter with the patient including review of chart and various tests results, discussions about plan of care and coordination of care plan.   Joniel Graumann, MD   [facility] - A DEPT OF [facility] 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  INTERVAL HISTORY:   Discussed the use of AI scribe software for clinical note transcription with the patient, who gave verbal consent to proceed.  History of Present Illness CONN BUNDICK is a 48 year old male who presents for follow-up after recent illness and hospitalization.  He has experienced significant weight loss recently, attributed to a recent illness contracted from his children, characterized by coughing and sneezing but no fever. During this period, he was immobile and not eating. Currently, his appetite remains poor, and he reports eating very little, losing his appetite quickly.  Prior to Easter, his J-tube fell out twice. Initially, his wife was able to replace it, but it fell out again before a scheduled appointment to have it stitched back in. The surgical team decided not to replace it as he was only using it for medications and was refusing tube feeds.  He has been out of methadone , which he was taking once daily, and his caregiver is concerned about the detox process. He is also taking Dilaudid , primarily at night, and hydrocortisone , although there is uncertainty about his current medication regimen as his caregiver is not always present to manage it.  He was recently taken to the emergency department due to altered mental status. Since then, he has improved significantly in terms of alertness and engagement.  His caregiver is experiencing difficulties with obtaining a grant for medication copays, which has delayed picking up some medications. He has been in contact with social workers to resolve this issue.  He is more active now, walking with the aid of a walker, and  he is working on increasing his physical activity to improve his strength.  No current fever, constipation, nausea, vomiting, or swallowing difficulties. His bowels are moving normally. He  experienced nausea and vomiting during his recent illness but not currently. Pain is not significant, and he has recently stopped methadone .    I have reviewed the past medical history, past surgical history, social history and family history with the patient and they are unchanged from previous note.  HISTORY OF PRESENT ILLNESS:   Oncology History  Adenocarcinoma of gastroesophageal junction (HCC)  05/29/2023 Initial Diagnosis   Adenocarcinoma of gastroesophageal junction (HCC)   07/18/2023 Cancer Staging   Staging form: Esophagus - Adenocarcinoma, AJCC 8th Edition - Clinical: Stage IVB (cT4, cN3, cM1, G3) - Signed by Arlo Berber, MD on 07/18/2023 Histologic grading system: 3 grade system   07/23/2023 -  Chemotherapy   Patient is on Treatment Plan : GASTROESOPHAGEAL FOLFOX + Nivolumab  q14d       48 y.o. gentleman with history of asthma, presented to the ED on 05/27/2023 with complaints of epigastric burning abdominal pain, nausea, retching.  He also reported some dark tarry stools for 3 days prior to arrival.  He was previously seen in the ED at [facility] on 05/08/2023 with complaints of chest or right upper quadrant abdominal pain.  Workup was unremarkable at that time and he was sent home with Protonix  and Carafate .  Since his symptoms persisted, he presented to the ED again.   In the ED, his hemoglobin was noted to be down to 10, compared to 13 previously.  With concern for upper GI bleed, was admitted for further evaluation and management.   Dr. Lavaughn Portland performed upper GI endoscopy on 05/28/2023.  It showed partially obstructing, likely malignant esophageal tumor in the lower third of the esophagus.  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  Normal duodenum.  Pathology from GE junction mass came back positive for invasive adenocarcinoma, moderately to poorly differentiated.  Immunostains pending.   CT chest abdomen pelvis on 05/28/2023 showed abnormal wall thickening at the GE  junction, compatible with patient's known carcinoma.  Prominent lymph nodes in the retrocrural region on the right side, adjacent to the hiatal hernia.  Abnormal low-density soft tissue in the gastrohepatic region, abutting the lesser curvature and proximal stomach, worrisome for metastatic disease.  Periportal, periceliac, gastrosplenic and left retroperitoneal lymphadenopathy, worrisome for metastatic involvement.  Hypodensities in the liver were also noted, worrisome for metastatic disease, largest 1 measuring 13 mm in the inferior right lobe.  Fat stranding surrounding the body and tail of pancreas, suggesting acute pancreatitis.   We were consulted during his hospitalization on 05/29/2023 for additional recommendations given new diagnosis of GE junction adenocarcinoma.   On 06/07/2023, staging PET/CT showed large hypermetabolic mass involving the distal esophagus and proximal stomach consistent with known primary esophageal carcinoma.  Multiple hypermetabolic retroperitoneal, lower mediastinal lymph nodes consistent with locally advanced disease.  Suspected direct tumor extension into the pancreatic body.  Hypermetabolic node or peritoneal implant within the omentum.  No other evidence of metastatic disease.   Plan was for palliative systemic treatments with FOLFOX plus nivolumab .  However patient's performance status continued to decline and he had to be hospitalized again on 06/14/2023.  He finally presented to reestablish care in our clinic on 07/18/2023.  His performance status improved with nutrition via J-tube.  Started systemic treatments from 07/23/2023.  We did dose reduce oxaliplatin  by 20% and proceeded with 68 mg/m dose.  When he  presented for cycle 2 on 08/07/2023, leukopenia with white count of 3300, ANC 1100.  We started skipping 5-FU bolus and continue with the dose reduced oxaliplatin .  Restaging PET scan on 09/10/2023 showed excellent response with significant reduction in size and  metabolism in the gastric mass.  Resolution of peritoneal metastatic disease.    REVIEW OF SYSTEMS:   Review of Systems - Oncology  All other pertinent systems were reviewed with the patient and are negative.  ALLERGIES: He has no known allergies.  MEDICATIONS:  Current Outpatient Medications  Medication Sig Dispense Refill   albuterol  (PROVENTIL ) (2.5 MG/3ML) 0.083% nebulizer solution Take 3 mLs (2.5 mg total) by nebulization every 6 (six) hours as needed for wheezing or shortness of breath. 75 mL 12   albuterol  (VENTOLIN  HFA) 108 (90 Base) MCG/ACT inhaler Inhale 1-2 puffs into the lungs every 4 (four) hours as needed for wheezing or shortness of breath. 18 g 3   Albuterol  Sulfate 2.5 MG/0.5ML NEBU Use 1 vial in nebulizer as directed every 6 hours as needed for wheezing or shortness of breath 30 mL 3   cyanocobalamin  1000 MCG tablet Take 1 tablet (1,000 mcg total) by mouth daily. 30 tablet 6   HYDROmorphone  (DILAUDID ) 2 MG tablet Take 1 tablet (2 mg total) by mouth every 6 (six) hours as needed for severe pain (pain score 7-10). 60 tablet 0   ondansetron  (ZOFRAN -ODT) 8 MG disintegrating tablet Dissolve 1 tablet (8 mg total) by mouth every 8 (eight) hours as needed for nausea or vomiting. 30 tablet 3   dronabinol  (MARINOL ) 5 MG capsule Take 1 capsule (5 mg total) by mouth 2 (two) times daily before lunch and supper. (Patient not taking: Reported on 10/30/2023) 60 capsule 1   feeding supplement (ENSURE ENLIVE / ENSURE PLUS) LIQD Take as directed (Patient not taking: Reported on 10/02/2023) 948 mL 12   folic acid  (FOLVITE ) 1 MG tablet Take 1 tablet (1 mg total) by mouth daily. (Patient not taking: Reported on 10/30/2023) 30 tablet 6   furosemide  (LASIX ) 20 MG tablet Take 1 tablet (20 mg total) by mouth daily as needed for fluid or edema. (Patient not taking: Reported on 10/30/2023) 30 tablet 3   hydrocortisone  (CORTEF ) 10 MG tablet Take 2 tablets by mouth every morning and 1 tablet every evening for  one week, then one tablet twice daily 50 tablet 1   lidocaine -prilocaine  (EMLA ) cream Apply to affected area once (Patient not taking: Reported on 10/09/2023) 30 g 3   methadone  (DOLOPHINE ) 5 MG tablet Take 1 tablet (5 mg total) by mouth daily. (Patient not taking: Reported on 10/30/2023) 30 tablet 0   metoCLOPramide  (REGLAN ) 10 MG tablet Take 1 tablet (10 mg total) by mouth 4 (four) times daily  before meals and at bedtime. 120 tablet 3   Nutritional Supplements (FEEDING SUPPLEMENT, OSMOLITE 1.5 CAL,) LIQD At 60 mL/h over 18 hours on a daily basis. (Patient not taking: Reported on 10/09/2023) 30000 mL 0   pantoprazole  (PROTONIX ) 40 MG tablet Take 1 tablet (40 mg total) by mouth 2 (two) times daily. 60 tablet 1   polyethylene glycol powder (GLYCOLAX /MIRALAX ) 17 GM/SCOOP powder Dissolve 17 g in 4 oz of liquid and take by mouth daily. (Patient not taking: Reported on 10/09/2023) 476 g 0   potassium chloride  (KLOR-CON ) 20 MEQ packet Take 20 mEq by mouth 2 (two) times daily. 60 packet 3   prochlorperazine  (COMPAZINE ) 10 MG tablet Take 1 tablet (10 mg total) by mouth every 6 (six)  hours as needed for nausea or vomiting. 30 tablet 1   senna-docusate (SENOKOT-S) 8.6-50 MG tablet Take 2 tablets by mouth at bedtime. (Patient not taking: Reported on 10/09/2023) 60 tablet 2   sucralfate  (CARAFATE ) 1 GM/10ML suspension Take 10 mLs (1 g total) by mouth 4 (four) times daily -  with meals and at bedtime. 1200 mL 2   traZODone  (DESYREL ) 50 MG tablet Take 1 tablet (50 mg total) by mouth at bedtime. 30 tablet 3   Water  For Irrigation, Sterile (FREE WATER ) SOLN Place 100 mLs into feeding tube 4 (four) times daily. 12000 mL 1   No current facility-administered medications for this visit.     VITALS:   Blood pressure 112/85, pulse 60, temperature 98 F (36.7 C), temperature source Temporal, resp. rate 16, weight 127 lb 3.2 oz (57.7 kg), SpO2 97%.  Wt Readings from Last 3 Encounters:  10/30/23 127 lb 3.2 oz (57.7 kg)   10/09/23 136 lb 11.2 oz (62 kg)  10/03/23 138 lb 14.2 oz (63 kg)    Body mass index is 20.53 kg/m.   Onc Performance Status - 10/30/23 1059       ECOG Perf Status   ECOG Perf Status Capable of only limited selfcare, confined to bed or chair more than 50% of waking hours      KPS SCALE   KPS % SCORE Disabled, requires special care and assistance              PHYSICAL EXAM:   Physical Exam Constitutional:      General: He is not in acute distress.    Appearance: Normal appearance.  HENT:     Head: Normocephalic and atraumatic.  Eyes:     General: No scleral icterus.    Conjunctiva/sclera: Conjunctivae normal.  Cardiovascular:     Rate and Rhythm: Normal rate and regular rhythm.     Heart sounds: Normal heart sounds.  Pulmonary:     Effort: Pulmonary effort is normal.     Breath sounds: Normal breath sounds.  Chest:     Comments: Port-A-Cath in place without signs of infection Abdominal:     General: There is no distension.     Comments: J-tube in place  Musculoskeletal:     Right lower leg: Edema present.     Left lower leg: Edema present.  Lymphadenopathy:     Cervical: No cervical adenopathy.  Neurological:     General: No focal deficit present.     Mental Status: He is oriented to person, place, and time.  Psychiatric:        Mood and Affect: Mood normal.        Behavior: Behavior normal.      LABORATORY DATA:   I have reviewed the data as listed.  Results for orders placed or performed in visit on 10/30/23  CMP (Cancer Center only)  Result Value Ref Range   Sodium 137 135 - 145 mmol/L   Potassium 3.4 (L) 3.5 - 5.1 mmol/L   Chloride 104 98 - 111 mmol/L   CO2 28 22 - 32 mmol/L   Glucose, Bld 123 (H) 70 - 99 mg/dL   BUN 7 6 - 20 mg/dL   Creatinine 4.09 (L) 8.11 - 1.24 mg/dL   Calcium  8.7 (L) 8.9 - 10.3 mg/dL   Total Protein 6.2 (L) 6.5 - 8.1 g/dL   Albumin 3.4 (L) 3.5 - 5.0 g/dL   AST 15 15 - 41 U/L   ALT 13 0 - 44  U/L   Alkaline  Phosphatase 62 38 - 126 U/L   Total Bilirubin 0.4 0.0 - 1.2 mg/dL   GFR, Estimated >09 >81 mL/min   Anion gap 5 5 - 15  CBC with Differential (Cancer Center Only)  Result Value Ref Range   WBC Count 3.9 (L) 4.0 - 10.5 K/uL   RBC 4.49 4.22 - 5.81 MIL/uL   Hemoglobin 11.1 (L) 13.0 - 17.0 g/dL   HCT 19.1 (L) 47.8 - 29.5 %   MCV 76.6 (L) 80.0 - 100.0 fL   MCH 24.7 (L) 26.0 - 34.0 pg   MCHC 32.3 30.0 - 36.0 g/dL   RDW 62.1 (H) 30.8 - 65.7 %   Platelet Count 235 150 - 400 K/uL   nRBC 0.0 0.0 - 0.2 %   Neutrophils Relative % 37 %   Neutro Abs 1.5 (L) 1.7 - 7.7 K/uL   Lymphocytes Relative 46 %   Lymphs Abs 1.8 0.7 - 4.0 K/uL   Monocytes Relative 11 %   Monocytes Absolute 0.4 0.1 - 1.0 K/uL   Eosinophils Relative 5 %   Eosinophils Absolute 0.2 0.0 - 0.5 K/uL   Basophils Relative 1 %   Basophils Absolute 0.0 0.0 - 0.1 K/uL   Immature Granulocytes 0 %   Abs Immature Granulocytes 0.01 0.00 - 0.07 K/uL      RADIOGRAPHIC STUDIES:  I have personally reviewed the radiological images as listed and agree with the findings in the report.  CT HEAD W & WO CONTRAST ( ) Result Date: 10/03/2023 CLINICAL DATA:  Altered mental status and suspected brain metastases EXAM: CT HEAD WITHOUT AND WITH CONTRAST TECHNIQUE: Contiguous axial images were obtained from the base of the skull through the vertex without and with intravenous contrast. RADIATION DOSE REDUCTION: This exam was performed according to the departmental dose-optimization program which includes automated exposure control, adjustment of the mA and/or kV according to patient size and/or use of iterative reconstruction technique. CONTRAST:  75mL OMNIPAQUE  IOHEXOL  300 MG/ML  SOLN COMPARISON:  07/03/2023 FINDINGS: Brain: No mass,hemorrhage or extra-axial collection. Normal appearance of the parenchyma and CSF spaces. No abnormal enhancement. Vascular: No hyperdense vessel or unexpected vascular calcification. Skull: The visualized skull base,  calvarium and extracranial soft tissues are normal. Sinuses/Orbits: No fluid levels or advanced mucosal thickening of the visualized paranasal sinuses. No mastoid or middle ear effusion. Normal orbits. Other: None. IMPRESSION: Normal head CT. Electronically Signed   By: Juanetta Nordmann M.D.   On: 10/03/2023 20:23   DG Chest Port 1 View Result Date: 10/03/2023 CLINICAL DATA:  Weakness. EXAM: PORTABLE CHEST 1 VIEW COMPARISON:  Chest radiograph dated 07/30/2023. FINDINGS: Right-sided Port-A-Cath with tip at cavoatrial junction. No focal consolidation, pleural effusion, pneumothorax. The cardiac silhouette is within normal limits. No acute osseous pathology. IMPRESSION: No active disease. Electronically Signed   By: Angus Bark M.D.   On: 10/03/2023 17:37    CODE STATUS:  Code Status History     Date Active Date Inactive Code Status Order ID Comments User Context   06/14/2023 1621 07/11/2023 2225 Full Code [id number]  Danice Dural, MD Inpatient   06/13/2023 1253 06/14/2023 0509 Full Code [id number]  Elene Griffes, MD [facility]   05/27/2023 1516 05/30/2023 1651 Full Code [id number]  Gaylin Ke, MD ED    Questions for Most Recent Historical Code Status (Order [id number])     Question Answer   By: Consent: discussion documented in EHR  No orders of the defined types were placed in this encounter.    No future appointments.    This document was completed utilizing speech recognition software. Grammatical errors, random word insertions, pronoun errors, and incomplete sentences are an occasional consequence of this system due to software limitations, ambient noise, and hardware issues. Any formal questions or concerns about the content, text or information contained within the body of this dictation should be directly addressed to the provider for clarification.

## 2023-10-31 ENCOUNTER — Encounter: Payer: Self-pay | Admitting: Oncology

## 2023-10-31 ENCOUNTER — Other Ambulatory Visit: Payer: Self-pay

## 2023-10-31 ENCOUNTER — Other Ambulatory Visit: Payer: Self-pay | Admitting: Nurse Practitioner

## 2023-10-31 ENCOUNTER — Encounter (HOSPITAL_COMMUNITY): Payer: Self-pay

## 2023-10-31 ENCOUNTER — Other Ambulatory Visit (HOSPITAL_COMMUNITY): Payer: Self-pay

## 2023-10-31 DIAGNOSIS — C160 Malignant neoplasm of cardia: Secondary | ICD-10-CM

## 2023-10-31 DIAGNOSIS — Z515 Encounter for palliative care: Secondary | ICD-10-CM

## 2023-10-31 LAB — T4: T4, Total: 9.4 ug/dL (ref 4.5–12.0)

## 2023-10-31 MED ORDER — POTASSIUM CHLORIDE CRYS ER 20 MEQ PO TBCR
20.0000 meq | EXTENDED_RELEASE_TABLET | Freq: Two times a day (BID) | ORAL | 3 refills | Status: DC
  Filled 2023-10-31: qty 60, 30d supply, fill #0
  Filled 2023-12-04: qty 60, 30d supply, fill #1
  Filled 2024-01-02: qty 60, 30d supply, fill #2
  Filled 2024-02-03: qty 60, 30d supply, fill #3

## 2023-11-01 ENCOUNTER — Encounter: Payer: Self-pay | Admitting: Oncology

## 2023-11-01 ENCOUNTER — Other Ambulatory Visit (HOSPITAL_COMMUNITY): Payer: Self-pay

## 2023-11-01 DIAGNOSIS — T85528A Displacement of other gastrointestinal prosthetic devices, implants and grafts, initial encounter: Secondary | ICD-10-CM | POA: Insufficient documentation

## 2023-11-01 DIAGNOSIS — R634 Abnormal weight loss: Secondary | ICD-10-CM | POA: Insufficient documentation

## 2023-11-01 NOTE — Assessment & Plan Note (Signed)
 Significant weight loss noted, likely due to decreased appetite and refusal of tube feeds. Appetite remains low, and he loses appetite quickly. No issues with swallowing or bowel movements. Blood work shows improvement in hemoglobin and potassium levels, but weight remains a concern. Encouragement to increase caloric intake and activity to improve strength. - Arrange appointments with a nutritionist - Encourage increased caloric and protein intake - Encourage activity to improve strength

## 2023-11-01 NOTE — Assessment & Plan Note (Signed)
 The J-tube fell out twice before Easter, and the hole started to close, necessitating a surgical procedure for replacement. However, the decision was made not to replace it as it was only used for medication administration, and he was refusing tube feeds.

## 2023-11-01 NOTE — Assessment & Plan Note (Addendum)
-  Please review HPI/oncology history for additional details and timeline of events.  -Reviewed staging PET/CT findings with the patient previously.  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  Discussed treatment options. All treatment options are palliative in nature and not curative intent. Plan made to proceed with palliative systemic treatment using FOLFOX plus nivolumab .   -He started palliative systemic treatments with FOLFOX plus nivolumab  from 07/23/2023.  He was admitted to the hospital on 07/30/2023 after he presented with chest and epigastric pain, episodes of melena. He was found to have hemoglobin of 6.8. He was admitted for blood transfusion and further evaluation.  GI deferred additional workup/EGD since the cause of blood loss seemed obvious from malignancy. He was discharged home on 08/02/2023.   -He resumed systemic treatments with cycle 2 of FOLFOX plus nivolumab  on 08/07/2023 without 5-FU bolus and a 20% dose reduction of oxaliplatin  and tolerated the chemotherapy and immunotherapy reasonably well.  Did not experience major side effects.  Recent lower extremity ultrasound to rule out DVT showed possible inguinal lymphadenopathy.  Hence PET/CT was obtained for further evaluation on 09/10/2023.  It showed significant improvement with decreased size and metabolic some of the gastric mass.  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  Overall PET scan showing encouraging results.  Hence plan made to continue current management with dose reduced FOLFOX and nivolumab .  Last chemotherapy with FOLFOX and nivolumab  was on 09/18/2023.  He has not received treatment in the last five to six weeks due to concerns about immunotherapy causing adverse effects, including altered mental status. Previous PET scan showed remarkable response to treatment. Hormone tests were normal, indicating no pituitary gland  involvement. Plan to resume treatment at a lower dosage to prevent fatigue and other side effects, while continuing immunotherapy and the pump. The goal is to maintain progress seen in the PET scan.  - Resume treatment at a lower dosage next week - Coordinate with the infusion room for scheduling  Following cycle 6, plan is to transition him to maintenance treatments with 5-FU and nivolumab .  This will be continued every 2 weeks.  Only if intolerable side effects are noted to chemotherapy, then we will transition him to maintenance treatment with immunotherapy alone.

## 2023-11-05 NOTE — Progress Notes (Unsigned)
 [facility]     Telephone:([phone] Fax:(336) [phone]    Patient Care Team: Jerrlyn Morel, NP as PCP - General (Pulmonary Disease) Arlo Berber, MD as Consulting Physician (Oncology) Ozell Blunt, MD as Consulting Physician (Gastroenterology) Pickenpack-Cousar, Giles Labrum, NP as Nurse Practitioner (Hospice and Palliative Medicine)   CHIEF COMPLAINT: Follow-up cancer of GEJ  Oncology History  Adenocarcinoma of gastroesophageal junction (HCC)  05/29/2023 Initial Diagnosis   Adenocarcinoma of gastroesophageal junction (HCC)   07/18/2023 Cancer Staging   Staging form: Esophagus - Adenocarcinoma, AJCC 8th Edition - Clinical: Stage IVB (cT4, cN3, cM1, G3) - Signed by Arlo Berber, MD on 07/18/2023 Histologic grading system: 3 grade system   07/23/2023 -  Chemotherapy   Patient is on Treatment Plan : GASTROESOPHAGEAL FOLFOX + Nivolumab  q14d        CURRENT THERAPY: FOLFOX/Nivo, started 07/23/2023, s/p cycle 5 on 09/18/23  INTERVAL HISTORY Jesus Haynes returns for follow-up and treatment, last seen by Dr. Randye Buttner 10/30/2023.  He tolerated last cycle mostly well, nausea much improved with scheduled Reglan  and Zofran , he gained 6 pounds.  Denies vomiting, dysphagia, pain, fever, chills, chest pain, dyspnea or significant edema.  Relative reports a mild cough she attributes to his partaking in recreational marijuana use.  ROS  All other systems reviewed and negative  Past Medical History:  Diagnosis Date   Asthma      Past Surgical History:  Procedure Laterality Date   BIOPSY  05/28/2023   Procedure: BIOPSY;  Surgeon: Ozell Blunt, MD;  Location: [facility];  Service: Gastroenterology;;   ESOPHAGOGASTRODUODENOSCOPY (EGD) WITH PROPOFOL  N/A 05/28/2023   Procedure: ESOPHAGOGASTRODUODENOSCOPY (EGD) WITH PROPOFOL ;  Surgeon: Ozell Blunt, MD;  Location: [facility];  Service: Gastroenterology;  Laterality: N/A;   IR IMAGING GUIDED PORT INSERTION  06/13/2023    UMBILICAL HERNIA REPAIR N/A 06/18/2023   Procedure: PLACEMENT OF J TUBE;  Surgeon: Lujean Sake, MD;  Location: [facility];  Service: General;  Laterality: N/A;     Outpatient Encounter Medications as of 11/06/2023  Medication Sig Note   albuterol  (PROVENTIL ) (2.5 MG/3ML) 0.083% nebulizer solution Take 3 mLs (2.5 mg total) by nebulization every 6 (six) hours as needed for wheezing or shortness of breath.    albuterol  (VENTOLIN  HFA) 108 (90 Base) MCG/ACT inhaler Inhale 1-2 puffs into the lungs every 4 (four) hours as needed for wheezing or shortness of breath.    Albuterol  Sulfate 2.5 MG/0.5ML NEBU Use 1 vial in nebulizer as directed every 6 hours as needed for wheezing or shortness of breath    cyanocobalamin  1000 MCG tablet Take 1 tablet (1,000 mcg total) by mouth daily.    dronabinol  (MARINOL ) 5 MG capsule Take 1 capsule (5 mg total) by mouth 2 (two) times daily before lunch and supper. (Patient not taking: Reported on 10/30/2023) 10/30/2023: Will pick up today per caregiver   feeding supplement (ENSURE ENLIVE / ENSURE PLUS) LIQD Take as directed (Patient not taking: Reported on 10/02/2023) 09/18/2023: Patient won't drink this   folic acid  (FOLVITE ) 1 MG tablet Take 1 tablet (1 mg total) by mouth daily. (Patient not taking: Reported on 10/30/2023)    furosemide  (LASIX ) 20 MG tablet Take 1 tablet (20 mg total) by mouth daily as needed for fluid or edema. (Patient not taking: Reported on 10/30/2023)    hydrocortisone  (CORTEF ) 10 MG tablet Take 2 tablets by mouth every morning and 1 tablet every evening for one week, then one tablet twice daily  HYDROmorphone  (DILAUDID ) 2 MG tablet Take 1 tablet (2 mg total) by mouth every 6 (six) hours as needed for severe pain (pain score 7-10).    lidocaine -prilocaine  (EMLA ) cream Apply to affected area once (Patient not taking: Reported on 10/09/2023) 10/09/2023: Have never used   methadone  (DOLOPHINE ) 5 MG tablet Take 1 tablet (5 mg total) by mouth daily.    metoCLOPramide   (REGLAN ) 10 MG tablet Take 1 tablet (10 mg total) by mouth 4 (four) times daily  before meals and at bedtime.    Nutritional Supplements (FEEDING SUPPLEMENT, OSMOLITE 1.5 CAL,) LIQD At 60 mL/h over 18 hours on a daily basis. (Patient not taking: Reported on 10/09/2023)    ondansetron  (ZOFRAN -ODT) 8 MG disintegrating tablet Dissolve 1 tablet (8 mg total) by mouth every 8 (eight) hours as needed for nausea or vomiting.    pantoprazole  (PROTONIX ) 40 MG tablet Take 1 tablet (40 mg total) by mouth 2 (two) times daily.    polyethylene glycol powder (GLYCOLAX /MIRALAX ) 17 GM/SCOOP powder Dissolve 17 g in 4 oz of liquid and take by mouth daily. (Patient not taking: Reported on 10/09/2023)    potassium chloride  SA (KLOR-CON  M) 20 MEQ tablet Take 1 tablet (20 mEq total) by mouth 2 (two) times daily.    prochlorperazine  (COMPAZINE ) 10 MG tablet Take 1 tablet (10 mg total) by mouth every 6 (six) hours as needed for nausea or vomiting.    senna-docusate (SENOKOT-S) 8.6-50 MG tablet Take 2 tablets by mouth at bedtime. (Patient not taking: Reported on 10/09/2023)    sucralfate  (CARAFATE ) 1 GM/10ML suspension Take 10 mLs (1 g total) by mouth 4 (four) times daily -  with meals and at bedtime.    traZODone  (DESYREL ) 50 MG tablet Take 1 tablet (50 mg total) by mouth at bedtime.    Water  For Irrigation, Sterile (FREE WATER ) SOLN Place 100 mLs into feeding tube 4 (four) times daily.    [DISCONTINUED] cetirizine  (ZYRTEC ) 10 MG tablet Take 1 tablet (10 mg total) by mouth daily. (Patient not taking: Reported on 01/26/2019)    [DISCONTINUED] fluticasone  (FLONASE ) 50 MCG/ACT nasal spray Place 1-2 sprays into both nostrils daily. (Patient not taking: Reported on 01/26/2019)    No facility-administered encounter medications on file as of 11/06/2023.     Today's Vitals   11/06/23 1158  BP: 101/74  Pulse: 60  Resp: 18  Temp: 98.1 F (36.7 C)  SpO2: 96%  Weight: 133 lb 4.8 oz (60.5 kg)  Height: 5\' 6"  (1.676 m)   Body mass index  is 21.52 kg/m.   ECOG PERFORMANCE STATUS: 1-2  PHYSICAL EXAM GENERAL:alert, no distress and comfortable SKIN: no rash  EYES: sclera clear NECK: without mass LYMPH:  no palpable cervical or supraclavicular lymphadenopathy  LUNGS: clear with normal breathing effort HEART: regular rate & rhythm, no lower extremity edema ABDOMEN: abdomen soft, non-tender and normal bowel sounds NEURO: alert & oriented x 3 with fluent speech, flat affect, no focal motor/sensory deficits PAC without erythema    CBC    Latest Ref Rng & Units 11/06/2023   11:13 AM 10/30/2023   10:22 AM 10/09/2023   11:45 AM  CBC  WBC 4.0 - 10.5 K/uL 4.1  3.9  4.0   Hemoglobin 13.0 - 17.0 g/dL 16.1  09.6  04.5   Hematocrit 39.0 - 52.0 % 31.7  34.4  33.7   Platelets 150 - 400 K/uL 194  235  215       CMP     Latest Ref  Rng & Units 11/06/2023   11:13 AM 10/30/2023   10:22 AM 10/09/2023   11:45 AM  CMP  Glucose 70 - 99 mg/dL 83  161  96   BUN 6 - 20 mg/dL 10  7  6    Creatinine 0.61 - 1.24 mg/dL 0.96  0.45  4.09   Sodium 135 - 145 mmol/L 139  137  135   Potassium 3.5 - 5.1 mmol/L 4.1  3.4  3.0   Chloride 98 - 111 mmol/L 103  104  99   CO2 22 - 32 mmol/L 31  28  31    Calcium  8.9 - 10.3 mg/dL 8.4  8.7  8.2   Total Protein 6.5 - 8.1 g/dL 5.7  6.2  5.6   Total Bilirubin 0.0 - 1.2 mg/dL 0.4  0.4  0.6   Alkaline Phos 38 - 126 U/L 53  62  55   AST 15 - 41 U/L 15  15  26    ALT 0 - 44 U/L 11  13  23        ASSESSMENT & PLAN:48 year old male with    Adenocarcinoma of gastroesophageal junction (GEJ), 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 S/p cycle 5 dose reduced FOLFOX/nivo tolerating better with scheduled anti-emetics. We reviewed symptom management. Weight improved. He has recovered well -Labs reviewed, adequate to proceed with cycle 6 FOLFOX/nivo today as scheduled, same dose -Follow-up in 2 weeks with next cycle   Leg edema  -Onset with diagnosis and treatment, stable per pt - Doppler negative for DVT but showed some concern for inguinal lymphadenopathy -Improved, Lasix  stopped   Anemia -Secondary to tumor bleeding and chemotherapy  -Required admission and blood transfusion after C1 chemo for Hgb 6.8 -Improved and stable    PLAN: -Labs reviewed -Proceed with cycle 6 FOLFOX/Nivo today, same dose -Reviewed meds and symptom management -F/up and treatment in 2 weeks   All questions were answered. The patient knows to call the clinic with any problems, questions or concerns. No barriers to learning were detected.  Ules Marsala K Rhianna Raulerson, NP 11/06/2023

## 2023-11-06 ENCOUNTER — Inpatient Hospital Stay

## 2023-11-06 ENCOUNTER — Other Ambulatory Visit: Payer: Self-pay | Admitting: Oncology

## 2023-11-06 ENCOUNTER — Inpatient Hospital Stay: Admitting: Dietician

## 2023-11-06 ENCOUNTER — Inpatient Hospital Stay (HOSPITAL_BASED_OUTPATIENT_CLINIC_OR_DEPARTMENT_OTHER): Admitting: Nurse Practitioner

## 2023-11-06 ENCOUNTER — Encounter: Payer: Self-pay | Admitting: Nurse Practitioner

## 2023-11-06 ENCOUNTER — Other Ambulatory Visit (HOSPITAL_COMMUNITY): Payer: Self-pay

## 2023-11-06 ENCOUNTER — Inpatient Hospital Stay: Attending: Internal Medicine

## 2023-11-06 ENCOUNTER — Other Ambulatory Visit: Payer: Self-pay

## 2023-11-06 VITALS — BP 101/74 | HR 60 | Temp 98.1°F | Resp 18 | Ht 66.0 in | Wt 133.3 lb

## 2023-11-06 DIAGNOSIS — Z515 Encounter for palliative care: Secondary | ICD-10-CM

## 2023-11-06 DIAGNOSIS — C160 Malignant neoplasm of cardia: Secondary | ICD-10-CM

## 2023-11-06 DIAGNOSIS — D701 Agranulocytosis secondary to cancer chemotherapy: Secondary | ICD-10-CM | POA: Insufficient documentation

## 2023-11-06 DIAGNOSIS — G893 Neoplasm related pain (acute) (chronic): Secondary | ICD-10-CM

## 2023-11-06 DIAGNOSIS — Z5111 Encounter for antineoplastic chemotherapy: Secondary | ICD-10-CM | POA: Diagnosis present

## 2023-11-06 DIAGNOSIS — Z79899 Other long term (current) drug therapy: Secondary | ICD-10-CM | POA: Insufficient documentation

## 2023-11-06 DIAGNOSIS — Z95828 Presence of other vascular implants and grafts: Secondary | ICD-10-CM

## 2023-11-06 DIAGNOSIS — R530 Neoplastic (malignant) related fatigue: Secondary | ICD-10-CM | POA: Diagnosis not present

## 2023-11-06 LAB — CMP (CANCER CENTER ONLY)
ALT: 11 U/L (ref 0–44)
AST: 15 U/L (ref 15–41)
Albumin: 3.1 g/dL — ABNORMAL LOW (ref 3.5–5.0)
Alkaline Phosphatase: 53 U/L (ref 38–126)
Anion gap: 5 (ref 5–15)
BUN: 10 mg/dL (ref 6–20)
CO2: 31 mmol/L (ref 22–32)
Calcium: 8.4 mg/dL — ABNORMAL LOW (ref 8.9–10.3)
Chloride: 103 mmol/L (ref 98–111)
Creatinine: 0.64 mg/dL (ref 0.61–1.24)
GFR, Estimated: >60 mL/min (ref >60)
Glucose, Bld: 83 mg/dL (ref 70–99)
Potassium: 4.1 mmol/L (ref 3.5–5.1)
Sodium: 139 mmol/L (ref 135–145)
Total Bilirubin: 0.4 mg/dL (ref 0.0–1.2)
Total Protein: 5.7 g/dL — ABNORMAL LOW (ref 6.5–8.1)

## 2023-11-06 LAB — CBC WITH DIFFERENTIAL (CANCER CENTER ONLY)
Abs Immature Granulocytes: 0 10*3/uL (ref 0.00–0.07)
Basophils Absolute: 0 10*3/uL (ref 0.0–0.1)
Basophils Relative: 1 %
Eosinophils Absolute: 0.1 10*3/uL (ref 0.0–0.5)
Eosinophils Relative: 3 %
HCT: 31.7 % — ABNORMAL LOW (ref 39.0–52.0)
Hemoglobin: 10.1 g/dL — ABNORMAL LOW (ref 13.0–17.0)
Immature Granulocytes: 0 %
Lymphocytes Relative: 38 %
Lymphs Abs: 1.5 10*3/uL (ref 0.7–4.0)
MCH: 25.4 pg — ABNORMAL LOW (ref 26.0–34.0)
MCHC: 31.9 g/dL (ref 30.0–36.0)
MCV: 79.6 fL — ABNORMAL LOW (ref 80.0–100.0)
Monocytes Absolute: 0.5 10*3/uL (ref 0.1–1.0)
Monocytes Relative: 11 %
Neutro Abs: 1.9 10*3/uL (ref 1.7–7.7)
Neutrophils Relative %: 47 %
Platelet Count: 194 10*3/uL (ref 150–400)
RBC: 3.98 MIL/uL — ABNORMAL LOW (ref 4.22–5.81)
RDW: 22.1 % — ABNORMAL HIGH (ref 11.5–15.5)
WBC Count: 4.1 10*3/uL (ref 4.0–10.5)
nRBC: 0 % (ref 0.0–0.2)

## 2023-11-06 LAB — TSH: TSH: 0.273 u[IU]/mL — ABNORMAL LOW (ref 0.350–4.500)

## 2023-11-06 MED ORDER — OXALIPLATIN CHEMO INJECTION 100 MG/20ML
68.0000 mg/m2 | Freq: Once | INTRAVENOUS | Status: AC
  Administered 2023-11-06: 115 mg via INTRAVENOUS
  Filled 2023-11-06: qty 23

## 2023-11-06 MED ORDER — PALONOSETRON HCL INJECTION 0.25 MG/5ML
0.2500 mg | Freq: Once | INTRAVENOUS | Status: AC
  Administered 2023-11-06: 0.25 mg via INTRAVENOUS
  Filled 2023-11-06: qty 5

## 2023-11-06 MED ORDER — DEXTROSE 5 % IV SOLN: INTRAVENOUS | Status: DC

## 2023-11-06 MED ORDER — SODIUM CHLORIDE 0.9 % IV SOLN
240.0000 mg | Freq: Once | INTRAVENOUS | Status: AC
  Administered 2023-11-06: 240 mg via INTRAVENOUS
  Filled 2023-11-06: qty 24

## 2023-11-06 MED ORDER — SODIUM CHLORIDE 0.9% FLUSH
10.0000 mL | INTRAVENOUS | Status: DC | PRN
  Administered 2023-11-06: 10 mL

## 2023-11-06 MED ORDER — SODIUM CHLORIDE 0.9% FLUSH
10.0000 mL | Freq: Once | INTRAVENOUS | Status: AC
  Administered 2023-11-06: 10 mL

## 2023-11-06 MED ORDER — DEXAMETHASONE SODIUM PHOSPHATE 10 MG/ML IJ SOLN
10.0000 mg | Freq: Once | INTRAMUSCULAR | Status: AC
  Administered 2023-11-06: 10 mg via INTRAVENOUS
  Filled 2023-11-06: qty 1

## 2023-11-06 MED ORDER — LEUCOVORIN CALCIUM INJECTION 350 MG
400.0000 mg/m2 | Freq: Once | INTRAVENOUS | Status: AC
  Administered 2023-11-06: 688 mg via INTRAVENOUS
  Filled 2023-11-06: qty 34.4

## 2023-11-06 MED ORDER — SODIUM CHLORIDE 0.9 % IV SOLN
2400.0000 mg/m2 | INTRAVENOUS | Status: DC
  Administered 2023-11-06: 4150 mg via INTRAVENOUS
  Filled 2023-11-06: qty 83

## 2023-11-06 NOTE — Progress Notes (Signed)
 Palliative Medicine [facility]  Telephone:(336) [phone] Fax:(336) [phone]   Name: Jesus Haynes Date: 11/06/2023 MRN: [id number]  DOB: 05-13-1976  Patient Care Team: Jerrlyn Morel, NP as PCP - General (Pulmonary Disease) Arlo Berber, MD as Consulting Physician (Oncology) Ozell Blunt, MD as Consulting Physician (Gastroenterology) Pickenpack-Cousar, Giles Labrum, NP as Nurse Practitioner (Hospice and Palliative Medicine)    INTERVAL HISTORY: Jesus Haynes is a 48 y.o. 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 Palliative ask to see for symptom management and goals of care.   SOCIAL HISTORY:     reports that he quit smoking about 10 years ago. His smoking use included cigarettes. He has never used smokeless tobacco. He reports that he does not drink alcohol and does not use drugs.  ADVANCE DIRECTIVES:  None on file   CODE STATUS: Full code  PAST MEDICAL HISTORY: Past Medical History:  Diagnosis Date   Asthma     ALLERGIES:  has no known allergies.  MEDICATIONS:  Current Outpatient Medications  Medication Sig Dispense Refill   albuterol  (PROVENTIL ) (2.5 MG/3ML) 0.083% nebulizer solution Take 3 mLs (2.5 mg total) by nebulization every 6 (six) hours as needed for wheezing or shortness of breath. 75 mL 12   albuterol  (VENTOLIN  HFA) 108 (90 Base) MCG/ACT inhaler Inhale 1-2 puffs into the lungs every 4 (four) hours as needed for wheezing or shortness of breath. 18 g 3   Albuterol  Sulfate 2.5 MG/0.5ML NEBU Use 1 vial in nebulizer as directed every 6 hours as needed for wheezing or shortness of breath 30 mL 3   cyanocobalamin  1000 MCG tablet Take 1 tablet (1,000 mcg total) by mouth daily. 30 tablet 6   dronabinol  (MARINOL ) 5 MG capsule Take 1 capsule (5 mg total) by mouth 2 (two) times daily before lunch and supper. (Patient not  taking: Reported on 10/30/2023) 60 capsule 1   feeding supplement (ENSURE ENLIVE / ENSURE PLUS) LIQD Take as directed (Patient not taking: Reported on 10/02/2023) 948 mL 12   folic acid  (FOLVITE ) 1 MG tablet Take 1 tablet (1 mg total) by mouth daily. (Patient not taking: Reported on 10/30/2023) 30 tablet 6   furosemide  (LASIX ) 20 MG tablet Take 1 tablet (20 mg total) by mouth daily as needed for fluid or edema. (Patient not taking: Reported on 10/30/2023) 30 tablet 3   hydrocortisone  (CORTEF ) 10 MG tablet Take 2 tablets by mouth every morning and 1 tablet every evening for one week, then one tablet twice daily 50 tablet 1   HYDROmorphone  (DILAUDID ) 2 MG tablet Take 1 tablet (2 mg total) by mouth every 6 (six) hours as needed for severe pain (pain score 7-10). 60 tablet 0   lidocaine -prilocaine  (EMLA ) cream Apply to affected area once (Patient not taking: Reported on 10/09/2023) 30 g 3   methadone  (DOLOPHINE ) 5 MG tablet Take 1 tablet (5 mg total) by mouth daily. 30 tablet 0   metoCLOPramide  (REGLAN ) 10 MG tablet Take 1 tablet (10 mg total) by mouth 4 (four) times daily  before meals and at bedtime. 120 tablet 3   Nutritional Supplements (FEEDING SUPPLEMENT, OSMOLITE 1.5 CAL,) LIQD At 60 mL/h over 18 hours on a daily basis. (Patient not taking: Reported on 10/09/2023) 30000 mL 0   ondansetron  (ZOFRAN -ODT) 8 MG disintegrating tablet Dissolve 1 tablet (8 mg total) by mouth every 8 (eight) hours as needed for nausea or  vomiting. 30 tablet 3   pantoprazole  (PROTONIX ) 40 MG tablet Take 1 tablet (40 mg total) by mouth 2 (two) times daily. 60 tablet 1   polyethylene glycol powder (GLYCOLAX /MIRALAX ) 17 GM/SCOOP powder Dissolve 17 g in 4 oz of liquid and take by mouth daily. (Patient not taking: Reported on 10/09/2023) 476 g 0   potassium chloride  SA (KLOR-CON  M) 20 MEQ tablet Take 1 tablet (20 mEq total) by mouth 2 (two) times daily. 60 tablet 3   prochlorperazine  (COMPAZINE ) 10 MG tablet Take 1 tablet (10 mg total) by  mouth every 6 (six) hours as needed for nausea or vomiting. 30 tablet 1   senna-docusate (SENOKOT-S) 8.6-50 MG tablet Take 2 tablets by mouth at bedtime. (Patient not taking: Reported on 10/09/2023) 60 tablet 2   sucralfate  (CARAFATE ) 1 GM/10ML suspension Take 10 mLs (1 g total) by mouth 4 (four) times daily -  with meals and at bedtime. 1200 mL 2   traZODone  (DESYREL ) 50 MG tablet Take 1 tablet (50 mg total) by mouth at bedtime. 30 tablet 3   Water  For Irrigation, Sterile (FREE WATER ) SOLN Place 100 mLs into feeding tube 4 (four) times daily. 12000 mL 1   No current facility-administered medications for this visit.   Facility-Administered Medications Ordered in Other Visits  Medication Dose Route Frequency Provider Last Rate Last Admin   dextrose  5 % solution   Intravenous Continuous Pasam, Avinash, MD 10 mL/hr at 11/06/23 1237 New Bag at 11/06/23 1237   fluorouracil  (ADRUCIL ) 4,150 mg in sodium chloride  0.9 % 67 mL chemo infusion  2,400 mg/m2 (Treatment Plan Ideal) Intravenous 1 day or 1 dose Pasam, Avinash, MD       leucovorin  688 mg in dextrose  5 % 250 mL infusion  400 mg/m2 (Treatment Plan Ideal) Intravenous Once Pasam, Avinash, MD 142 mL/hr at 11/06/23 1359 688 mg at 11/06/23 1359   oxaliplatin  (ELOXATIN ) 115 mg in dextrose  5 % 500 mL chemo infusion  68 mg/m2 (Treatment Plan Ideal) Intravenous Once Pasam, Avinash, MD 262 mL/hr at 11/06/23 1358 115 mg at 11/06/23 1358   sodium chloride  flush (NS) 0.9 % injection 10 mL  10 mL Intracatheter PRN Pasam, Avinash, MD        VITAL SIGNS: There were no vitals taken for this visit. There were no vitals filed for this visit.  Estimated body mass index is 21.52 kg/m as calculated from the following:   Height as of an earlier encounter on 11/06/23: 5\' 6"  (1.676 m).   Weight as of an earlier encounter on 11/06/23: 133 lb 4.8 oz (60.5 kg).  PERFORMANCE STATUS (ECOG) : 1 - Symptomatic but completely ambulatory  Physical Exam General: NAD Cardiovascular:  regular rate and rhythm Pulmonary: normal breathing pattern Extremities: no edema, no joint deformities Skin: no rashes, PEG tube in place  Neurological: AAO x3  IMPRESSION: Discussed the use of AI scribe software for clinical note transcription with the patient, who gave verbal consent to proceed. History of Present Illness  Jesus Haynes is a 48 year old male who was seen during his infusion for symptom management follow-up. He is accompanied by his mother-in-law Jesus Haynes.  No acute distress noted.  Patient tolerating treatment without difficulty.  States he is doing much better than previous visits.  He is smiling and engaging in discussions during my visit.  He is experiencing financial difficulties with his medication costs, particularly with potassium chloride  pills, which are as expensive as all his other medications combined. States he has  not yet spoken to anyone about assistance  and is seeking help in this regard. Patient has been connected with Nadiyah, LCSW.   Jesus Haynes states she has organized his medications at home, ensuring he has three weeks' supply at a time. Also allowing for monitoring of compliance. Jesus Haynes states he is doing well with his medications.   His weight has increased by six pounds, which is seen as positive given the previous concern about his low weight. He attributes this to improved diet. He is much appreciative of this.   Mr. Mormon is experiencing sleep disturbances, getting only a couple of good hours of sleep at night. He is currently taking trazodone  50 mg or sleep but states he only gets about 3-4 hours of good sleep before waking up and finding difficulty returning back to sleep.  We discussed increasing dose to 100 mg to see if it improves his sleep quality.  Patient and family verbalized understanding and appreciation.  We discussed his pain regimen at length.  Thalmus is doing much better reporting his pain has decreased.  He is tolerating methadone  5 mg  daily.  He has Dilaudid  2 mg every 6 hours as needed however finds that given improvement in his pain he does not require around-the-clock.  He is able to go some days without taking.  Patient's last refill was on April 2.  We will continue with current regimen with no adjustments at this time.  All questions answered and support provided.  I discussed the importance of continued conversation with family and their medical providers regarding overall plan of care and treatment options, ensuring decisions are within the context of the patients values and GOCs. Assessment & Plan  Pain management Pain much improved.  Managed with methadone  and as needed hydromorphone .  Does not require breakthrough medication daily.  Last refill of hydromorphone  was on 10/02/2023.  No adjustments to regimen at this time.  - Continue methadone  once daily.   - Adjust Dilaudid  to every six hours as needed.  Not requiring around-the-clock.   - Will continue to closely monitor and adjust regimen as needed.  Weight gain Gained six pounds, positive given previous low weight. Attributed to dietary changes. - Continue current dietary regimen.  Follow-up Reassessment in two weeks to evaluate appetite stimulant effectiveness and pain management adjustments. - Schedule follow-up appointment in two weeks. - Ensure prescriptions are sent to pharmacy and approved.  I will plan to see patient back in 4-6 weeks.  Sooner if needed.  Patient expressed understanding and was in agreement with this plan. He also understands that He can call the clinic at any time with any questions, concerns, or complaints.   Any controlled substances utilized were prescribed in the context of palliative care. PDMP has been reviewed.   Visit consisted of counseling and education dealing with the complex and emotionally intense issues of symptom management and palliative care in the setting of serious and potentially life-threatening  illness.  Dellia Ferguson, AGPCNP-BC  Palliative Medicine Team/[facility]

## 2023-11-06 NOTE — Progress Notes (Signed)
 Nutrition Follow-up:  Patient with metastatic adenocarcinoma of GE junction. He is receiving palliative Folfox + Nivolumab  q14d. Patient is under the care of Dr. Randye Buttner.   1/28-1/31 hospital admit - UGI; acute/chronic blood loss anemia  12/13-1/9 hospital admit - FTT S/p open Jtube 12/17 (DME: [facility]) 4/15 - Jtube fell out - not replaced   Met with patient in infusion. He is drowsy at visit. Limited history obtained. Patient says his appetite is better. Feels he is eating more at meal times. Noted ~50% of large starbucks frappe on side table. Patient denies dysphagia with solids or medications. He denies nausea, vomiting, diarrhea, constipation.    Medications: marinol  (4/2)  Labs: albumin 3.1  Anthropometrics: Wt 133 lb 4.8 oz today - increased   4/30 - 127 lb 3.2 oz 4/2 - 138 lb 14.4 oz   Estimated Energy Needs  Kcals: 2195-2500 Protein: 103-113 Fluid: >/= 2L  NUTRITION DIAGNOSIS: Inadequate oral intake - improving   INTERVENTION:  Encourage high calorie high protein foods to promote wt gain Continue marinol  for appetite    MONITORING, EVALUATION, GOAL: wt trends, intake   NEXT VISIT: To be scheduled

## 2023-11-06 NOTE — Progress Notes (Signed)
 Dose chemo based on today's BSA per Dr Randye Buttner

## 2023-11-07 ENCOUNTER — Encounter: Payer: Self-pay | Admitting: Oncology

## 2023-11-07 ENCOUNTER — Other Ambulatory Visit (HOSPITAL_COMMUNITY): Payer: Self-pay

## 2023-11-07 LAB — T4: T4, Total: 7.9 ug/dL (ref 4.5–12.0)

## 2023-11-08 ENCOUNTER — Inpatient Hospital Stay

## 2023-11-08 VITALS — BP 118/74 | HR 62 | Temp 98.2°F | Resp 18

## 2023-11-08 DIAGNOSIS — C160 Malignant neoplasm of cardia: Secondary | ICD-10-CM

## 2023-11-08 DIAGNOSIS — Z5111 Encounter for antineoplastic chemotherapy: Secondary | ICD-10-CM | POA: Diagnosis not present

## 2023-11-08 MED ORDER — SODIUM CHLORIDE 0.9% FLUSH
10.0000 mL | INTRAVENOUS | Status: DC | PRN
  Administered 2023-11-08: 10 mL

## 2023-11-08 MED ORDER — HEPARIN SOD (PORK) LOCK FLUSH 100 UNIT/ML IV SOLN
500.0000 [IU] | Freq: Once | INTRAVENOUS | Status: AC | PRN
  Administered 2023-11-08: 500 [IU]

## 2023-11-12 ENCOUNTER — Other Ambulatory Visit (HOSPITAL_COMMUNITY): Payer: Self-pay

## 2023-11-15 ENCOUNTER — Telehealth: Payer: Self-pay

## 2023-11-15 NOTE — Telephone Encounter (Signed)
 [facility] CSW Progress Note  Clinical Child psychotherapist contacted caregiver by phone as requested by Palliative team regarding SNAP renewal. CSW was unable to reach patient, left vm with direct contact.    Maudie Sorrow, LCSW Clinical Social Worker [facility]

## 2023-11-18 ENCOUNTER — Other Ambulatory Visit: Payer: Self-pay | Admitting: Nurse Practitioner

## 2023-11-18 ENCOUNTER — Other Ambulatory Visit (HOSPITAL_COMMUNITY): Payer: Self-pay

## 2023-11-18 ENCOUNTER — Other Ambulatory Visit: Payer: Self-pay

## 2023-11-18 MED ORDER — SUCRALFATE 1 GM/10ML PO SUSP: 1.0000 g | Freq: Three times a day (TID) | ORAL | 2 refills | Status: DC

## 2023-11-20 ENCOUNTER — Encounter: Payer: Self-pay | Admitting: Oncology

## 2023-11-20 ENCOUNTER — Other Ambulatory Visit (HOSPITAL_COMMUNITY): Payer: Self-pay

## 2023-11-20 ENCOUNTER — Inpatient Hospital Stay (HOSPITAL_BASED_OUTPATIENT_CLINIC_OR_DEPARTMENT_OTHER): Admitting: Oncology

## 2023-11-20 ENCOUNTER — Inpatient Hospital Stay

## 2023-11-20 ENCOUNTER — Other Ambulatory Visit: Payer: Self-pay

## 2023-11-20 ENCOUNTER — Inpatient Hospital Stay: Admitting: Dietician

## 2023-11-20 VITALS — BP 119/86 | HR 64 | Temp 98.2°F | Resp 17 | Wt 138.6 lb

## 2023-11-20 DIAGNOSIS — C160 Malignant neoplasm of cardia: Secondary | ICD-10-CM

## 2023-11-20 DIAGNOSIS — G893 Neoplasm related pain (acute) (chronic): Secondary | ICD-10-CM

## 2023-11-20 DIAGNOSIS — T451X5A Adverse effect of antineoplastic and immunosuppressive drugs, initial encounter: Secondary | ICD-10-CM | POA: Diagnosis not present

## 2023-11-20 DIAGNOSIS — Z5111 Encounter for antineoplastic chemotherapy: Secondary | ICD-10-CM | POA: Diagnosis not present

## 2023-11-20 DIAGNOSIS — D701 Agranulocytosis secondary to cancer chemotherapy: Secondary | ICD-10-CM

## 2023-11-20 DIAGNOSIS — Z95828 Presence of other vascular implants and grafts: Secondary | ICD-10-CM

## 2023-11-20 LAB — CBC WITH DIFFERENTIAL (CANCER CENTER ONLY)
Abs Immature Granulocytes: 0 10*3/uL (ref 0.00–0.07)
Basophils Absolute: 0 10*3/uL (ref 0.0–0.1)
Basophils Relative: 0 %
Eosinophils Absolute: 0.2 10*3/uL (ref 0.0–0.5)
Eosinophils Relative: 6 %
HCT: 29.7 % — ABNORMAL LOW (ref 39.0–52.0)
Hemoglobin: 9.6 g/dL — ABNORMAL LOW (ref 13.0–17.0)
Immature Granulocytes: 0 %
Lymphocytes Relative: 35 %
Lymphs Abs: 1 10*3/uL (ref 0.7–4.0)
MCH: 26.4 pg (ref 26.0–34.0)
MCHC: 32.3 g/dL (ref 30.0–36.0)
MCV: 81.6 fL (ref 80.0–100.0)
Monocytes Absolute: 0.4 10*3/uL (ref 0.1–1.0)
Monocytes Relative: 13 %
Neutro Abs: 1.3 10*3/uL — ABNORMAL LOW (ref 1.7–7.7)
Neutrophils Relative %: 46 %
Platelet Count: 158 10*3/uL (ref 150–400)
RBC: 3.64 MIL/uL — ABNORMAL LOW (ref 4.22–5.81)
RDW: 19.5 % — ABNORMAL HIGH (ref 11.5–15.5)
WBC Count: 2.9 10*3/uL — ABNORMAL LOW (ref 4.0–10.5)
nRBC: 0 % (ref 0.0–0.2)

## 2023-11-20 LAB — CMP (CANCER CENTER ONLY)
ALT: 11 U/L (ref 0–44)
AST: 15 U/L (ref 15–41)
Albumin: 3.3 g/dL — ABNORMAL LOW (ref 3.5–5.0)
Alkaline Phosphatase: 60 U/L (ref 38–126)
Anion gap: 3 — ABNORMAL LOW (ref 5–15)
BUN: 8 mg/dL (ref 6–20)
CO2: 31 mmol/L (ref 22–32)
Calcium: 8.5 mg/dL — ABNORMAL LOW (ref 8.9–10.3)
Chloride: 104 mmol/L (ref 98–111)
Creatinine: 0.71 mg/dL (ref 0.61–1.24)
GFR, Estimated: >60 mL/min (ref >60)
Glucose, Bld: 87 mg/dL (ref 70–99)
Potassium: 4 mmol/L (ref 3.5–5.1)
Sodium: 138 mmol/L (ref 135–145)
Total Bilirubin: 0.4 mg/dL (ref 0.0–1.2)
Total Protein: 5.8 g/dL — ABNORMAL LOW (ref 6.5–8.1)

## 2023-11-20 MED ORDER — SODIUM CHLORIDE 0.9 % IV SOLN
240.0000 mg | Freq: Once | INTRAVENOUS | Status: AC
  Administered 2023-11-20: 240 mg via INTRAVENOUS
  Filled 2023-11-20: qty 24

## 2023-11-20 MED ORDER — SODIUM CHLORIDE 0.9% FLUSH
10.0000 mL | INTRAVENOUS | Status: DC | PRN
  Administered 2023-11-20: 10 mL

## 2023-11-20 MED ORDER — SODIUM CHLORIDE 0.9 % IV SOLN
Freq: Once | INTRAVENOUS | Status: AC
Start: 2023-11-20 — End: 2023-11-20

## 2023-11-20 MED ORDER — PALONOSETRON HCL INJECTION 0.25 MG/5ML
0.2500 mg | Freq: Once | INTRAVENOUS | Status: AC
  Administered 2023-11-20: 0.25 mg via INTRAVENOUS

## 2023-11-20 MED ORDER — HEPARIN SOD (PORK) LOCK FLUSH 100 UNIT/ML IV SOLN
500.0000 [IU] | Freq: Once | INTRAVENOUS | Status: AC | PRN
  Administered 2023-11-20: 500 [IU]

## 2023-11-20 MED ORDER — SODIUM CHLORIDE 0.9% FLUSH
10.0000 mL | Freq: Once | INTRAVENOUS | Status: AC
  Administered 2023-11-20: 10 mL

## 2023-11-20 NOTE — Progress Notes (Signed)
 Patient seen by Dr. Gale Jude Pasam today  Vitals are within treatment parameters:Yes   Labs are within treatment parameters: No (Please specify and give further instructions.)   WBC: 2.9  Treatment plan has been signed: Yes   Per physician team, Patient is ready for treatment. Please note the following modifications:   Per Dr. Randye Buttner: "We will hold FOLFOX today and proceed with Opdivo  (nivolumab ) only today. Holding chemotherapy because of leukopenia, but he will get immunotherapy today."

## 2023-11-20 NOTE — Assessment & Plan Note (Signed)
 We are holding 5-FU today for chemotherapy induced leukopenia.  Proceed with nivolumab  alone.  Expect white count to slowly improve over the next few weeks.

## 2023-11-20 NOTE — Assessment & Plan Note (Addendum)
-  Please review HPI/oncology history for additional details and timeline of events.  -Reviewed staging PET/CT findings with the patient previously.  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  Discussed treatment options. All treatment options are palliative in nature and not curative intent. Plan made to proceed with palliative systemic treatment using FOLFOX plus nivolumab .   -He started palliative systemic treatments with FOLFOX plus nivolumab  from 07/23/2023.  He was admitted to the hospital on 07/30/2023 after he presented with chest and epigastric pain, episodes of melena. He was found to have hemoglobin of 6.8. He was admitted for blood transfusion and further evaluation.  GI deferred additional workup/EGD since the cause of blood loss seemed obvious from malignancy. He was discharged home on 08/02/2023.   -He resumed systemic treatments with cycle 2 of FOLFOX plus nivolumab  on 08/07/2023 without 5-FU bolus and a 20% dose reduction of oxaliplatin  and tolerated the chemotherapy and immunotherapy reasonably well.  Did not experience major side effects.  Recent lower extremity ultrasound to rule out DVT showed possible inguinal lymphadenopathy.  Hence PET/CT was obtained for further evaluation on 09/10/2023.  It showed significant improvement with decreased size and metabolic some of the gastric mass.  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  Overall PET scan showing encouraging results.  Hence plan made to continue current management with dose reduced FOLFOX and nivolumab  for up to 6 cycles followed by maintenance treatments.  He received cycle 6 of FOLFOX plus nivolumab  on 11/06/2023. Following cycle 6, plan was to transition him to maintenance treatments with 5-FU and nivolumab , to be continued every 2 weeks.  Only if intolerable side effects are noted to chemotherapy, then we will transition him to  maintenance treatment with immunotherapy alone.  He presented today for maintenance treatment.  Clinically he is doing very well with improved appetite and he gained back 11 pounds.  Energy levels are better.  Overall encouraging outcome.  However his white count was 2900 today with ANC of 1300.  Hence we will skip 5-FU and proceed with nivolumab  treatment only today.  RTC in 2 weeks for reevaluation and possible resumption of 5-FU pump along with nivolumab  treatment.  Restaging PET scan will be obtained approximately 2 months from now.  To be ordered on future visits.

## 2023-11-20 NOTE — Progress Notes (Signed)
 Nutrition Follow-up:  Patient with metastatic adenocarcinoma of GE junction. He is receiving palliative Folfox + Nivolumab  q14d. Patient is under the care of Dr. Randye Buttner.   1/28-1/31 hospital admit - UGI; acute/chronic blood loss anemia  12/13-1/9 hospital admit - FTT S/p open Jtube 12/17 (DME: [facility]) 4/15 - Jtube fell out - not replaced   Chemo held today. Patient receiving immunotherapy. Met with patient and caregiver (Kelli) in infusion. Patient awake and alert today. Reports improving appetite and eating more. He gets full quickly and sometimes feels nauseas with intake. Nausea is well managed with antiemetics as well as marijuana. Yesterday recalls spaghetti and corndog with mashed potatoes. Patient is unable to recall what else he had. Patient is not drinking much water . Says he drinks mostly powerade or gatorade. He denies constipation or diarrhea.    Medications: carafate  (5/19)  Labs: albumin 3.3 - trending up   Anthropometrics: Wt 138 lb 9.6 oz today - trending up   5/7 - 133 lb 4.8 oz 4/30 - 127 lb 3.2 oz 4/2 - 138 lb 14.4 oz    Estimated Energy Needs  Kcals: 2195-2500 Protein: 103-113 Fluid: >/= 2L  NUTRITION DIAGNOSIS: Inadequate oral intake - improving     INTERVENTION:  Continue strategies for increasing calories and protein with small frequent meals/snacks Encourage high calorie high protein foods to promote wt gain    MONITORING, EVALUATION, GOAL: wt trends, intake   NEXT VISIT: Wednesday June 18 during infusion

## 2023-11-20 NOTE — Progress Notes (Signed)
 Opdivo  only for treatment today per Dr. Bearl Botts to treat with anc of 1.3

## 2023-11-20 NOTE — Patient Instructions (Signed)
 [facility] - A DEPT OF [facility]  Discharge Instructions: Thank you for choosing [facility] to provide your oncology and hematology care.   If you have a lab appointment with the Cancer Center, please go directly to the Cancer Center and check in at the registration area.   Wear comfortable clothing and clothing appropriate for easy access to any Portacath or PICC line.   We strive to give you quality time with your provider. You may need to reschedule your appointment if you arrive late (15 or more minutes).  Arriving late affects you and other patients whose appointments are after yours.  Also, if you miss three or more appointments without notifying the office, you may be dismissed from the clinic at the provider's discretion.      For prescription refill requests, have your pharmacy contact our office and allow 72 hours for refills to be completed.    Today you received the following chemotherapy and/or immunotherapy agents: Opdivo      To help prevent nausea and vomiting after your treatment, we encourage you to take your nausea medication as directed.  BELOW ARE SYMPTOMS THAT SHOULD BE REPORTED IMMEDIATELY: *FEVER GREATER THAN 100.4 F (38 C) OR HIGHER *CHILLS OR SWEATING *NAUSEA AND VOMITING THAT IS NOT CONTROLLED WITH YOUR NAUSEA MEDICATION *UNUSUAL SHORTNESS OF BREATH *UNUSUAL BRUISING OR BLEEDING *URINARY PROBLEMS (pain or burning when urinating, or frequent urination) *BOWEL PROBLEMS (unusual diarrhea, constipation, pain near the anus) TENDERNESS IN MOUTH AND THROAT WITH OR WITHOUT PRESENCE OF ULCERS (sore throat, sores in mouth, or a toothache) UNUSUAL RASH, SWELLING OR PAIN  UNUSUAL VAGINAL DISCHARGE OR ITCHING   Items with * indicate a potential emergency and should be followed up as soon as possible or go to the Emergency Department if any problems should occur.  Please show the CHEMOTHERAPY ALERT CARD or IMMUNOTHERAPY  ALERT CARD at check-in to the Emergency Department and triage nurse.  Should you have questions after your visit or need to cancel or reschedule your appointment, please contact [facility] - A DEPT OF [facility]  Dept: [phone]  and follow the prompts.  Office hours are 8:00 a.m. to 4:30 p.m. Monday - Friday. Please note that voicemails left after 4:00 p.m. may not be returned until the following business day.  We are closed weekends and major holidays. You have access to a nurse at all times for urgent questions. Please call the main number to the clinic Dept: [phone] and follow the prompts.   For any non-urgent questions, you may also contact your provider using MyChart. We now offer e-Visits for anyone 57 and older to request care online for non-urgent symptoms. For details visit [email or web address].   Also download the MyChart app! Go to the app store, search "MyChart", open the app, select , and log in with your MyChart username and password.

## 2023-11-20 NOTE — Assessment & Plan Note (Signed)
 Currently on methadone  and Dilaudid  for pain management. Radiation therapy has helped with pain, and there is no significant disease causing pain currently. Pain medications help with sleep, but concern exists that methadone  may affect appetite and mobility. - He is taking methadone  once daily now. - Continue Dilaudid  for breakthrough pain management. He will follow-up with Landa Pine, palliative care, for medication adjustments.

## 2023-11-20 NOTE — Progress Notes (Signed)
 [name]  ONCOLOGY CLINIC PROGRESS NOTE   Patient Care Team: Jerrlyn Morel, NP as PCP - General (Pulmonary Disease) Arlo Berber, MD as Consulting Physician (Oncology) Ozell Blunt, MD as Consulting Physician (Gastroenterology) Pickenpack-Cousar, Giles Labrum, NP as Nurse Practitioner [facility] and Palliative Medicine)  PATIENT NAME: Jesus Haynes   MR#: [id number] DOB: 09/16/1975  Date of visit: 11/20/2023   ASSESSMENT & PLAN:   Jesus Haynes is a 48 y.o. pleasant gentleman with history of asthma, presented to the ED on 05/27/2023 with complaints of epigastric burning abdominal pain, nausea, retching.  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  Adenocarcinoma of gastroesophageal junction (HCC) -Please review HPI/oncology history for additional details and timeline of events.  -Reviewed staging PET/CT findings with the patient previously.  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  Discussed treatment options. All treatment options are palliative in nature and not curative intent. Plan made to proceed with palliative systemic treatment using FOLFOX plus nivolumab .   -He started palliative systemic treatments with FOLFOX plus nivolumab  from 07/23/2023.  He was admitted to the hospital on 07/30/2023 after he presented with chest and epigastric pain, episodes of melena. He was found to have hemoglobin of 6.8. He was admitted for blood transfusion and further evaluation.  GI deferred additional workup/EGD since the cause of blood loss seemed obvious from malignancy. He was discharged home on 08/02/2023.   -He resumed systemic treatments with cycle 2 of FOLFOX plus nivolumab  on 08/07/2023 without 5-FU bolus and a 20% dose reduction of  oxaliplatin  and tolerated the chemotherapy and immunotherapy reasonably well.  Did not experience major side effects.  Recent lower extremity ultrasound to rule out DVT showed possible inguinal lymphadenopathy.  Hence PET/CT was obtained for further evaluation on 09/10/2023.  It showed significant improvement with decreased size and metabolic some of the gastric mass.  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  Overall PET scan showing encouraging results.  Hence plan made to continue current management with dose reduced FOLFOX and nivolumab  for up to 6 cycles followed by maintenance treatments.  He received cycle 6 of FOLFOX plus nivolumab  on 11/06/2023. Following cycle 6, plan was to transition him to maintenance treatments with 5-FU and nivolumab , to be continued every 2 weeks.  Only if intolerable side effects are noted to chemotherapy, then we will transition him to maintenance treatment with immunotherapy alone.  He presented today for maintenance treatment.  Clinically he is doing very well with improved appetite and he gained back 11 pounds.  Energy levels are better.  Overall encouraging outcome.  However his white count was 2900 today with ANC of 1300.  Hence we will skip 5-FU and proceed with nivolumab  treatment only today.  RTC in 2 weeks for reevaluation and possible resumption of 5-FU pump along with nivolumab  treatment.  Restaging PET scan will be obtained approximately 2 months from now.  To be ordered on future visits.  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 Radiation therapy has helped with pain, and there is no significant disease causing pain currently. Pain medications help with sleep, but concern exists that methadone  may affect appetite and mobility. - He is taking methadone  once daily now. - Continue Dilaudid  for breakthrough pain management. He will follow-up with Landa Pine, palliative care,  for medication  adjustments.  Leukopenia due to antineoplastic chemotherapy (HCC) We are holding 5-FU today for chemotherapy induced leukopenia.  Proceed with nivolumab  alone.  Expect white count to slowly improve over the next few weeks.    I reviewed lab results and outside records for this visit and discussed relevant results with the patient. Diagnosis, plan of care and treatment options were also discussed in detail with the patient. Opportunity provided to ask questions and answers provided to his apparent satisfaction. Provided instructions to call our clinic with any problems, questions or concerns prior to return visit. I recommended to continue follow-up with PCP and sub-specialists. He verbalized understanding and agreed with the plan.   NCCN guidelines have been consulted in the planning of this patient's care.  I spent a total of 30 minutes during this encounter with the patient including review of chart and various tests results, discussions about plan of care and coordination of care plan.   Maximino Cozzolino, MD   [facility] - A DEPT OF 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  INTERVAL HISTORY:   Discussed the use of AI scribe software for clinical note transcription with the patient, who gave verbal consent to proceed.  History of Present Illness Jesus Haynes is a 48 year old male with advanced cancer who presents for follow-up regarding his treatment regimen.  He has experienced a recent weight gain of 11 pounds, which is a positive change following a period of significant weight loss. He uses marijuana, which he feels has  helped stimulate his appetite. However, he is unable to obtain Mirasol legally, and Medna is currently back-ordered.  His current treatment regimen includes immunotherapy with nivolumab  (Opdivo ). His chemotherapy regimen, which includes oxaliplatin  and the Fyofu pump, has been temporarily halted due to a low white blood cell count, currently at 2,800. This count has fluctuated over the past month, previously reaching the lower limit of normal.  He is currently on methadone  once a day for pain management and uses Dilaudid  sparingly, only when necessary. Radiation therapy has helped reduce his need for pain medication.  His caregiver reports that he has been more actively participating in conversations, a notable improvement since before Christmas. His food stamps have been cut off, and there is a need for a note stating that he is unable to work due to his advanced cancer and treatment side effects.    I have reviewed the past medical history, past surgical history, social history and family history with the patient and they are unchanged from previous note.  HISTORY OF PRESENT ILLNESS:   Oncology History  Adenocarcinoma of gastroesophageal junction (HCC)  05/29/2023 Initial Diagnosis   Adenocarcinoma of gastroesophageal junction (HCC)   07/18/2023 Cancer Staging   Staging form: Esophagus - Adenocarcinoma, AJCC 8th Edition - Clinical: Stage IVB (cT4, cN3, cM1, G3) - Signed by Arlo Berber, MD on 07/18/2023 Histologic grading system: 3 grade system   07/23/2023 -  Chemotherapy   Patient is on Treatment Plan : GASTROESOPHAGEAL FOLFOX + Nivolumab  q14d       48 y.o. gentleman with history of asthma, presented to the ED on 05/27/2023 with complaints of epigastric  burning abdominal pain, nausea, retching.  He also reported some dark tarry stools for 3 days prior to arrival.  He was previously seen in the ED at [facility] on 05/08/2023 with complaints of chest or right upper quadrant abdominal  pain.  Workup was unremarkable at that time and he was sent home with Protonix  and Carafate .  Since his symptoms persisted, he presented to the ED again.   In the ED, his hemoglobin was noted to be down to 10, compared to 13 previously.  With concern for upper GI bleed, was admitted for further evaluation and management.   Dr. Magod performed upper GI endoscopy on 05/28/2023.  It showed partially obstructing, likely malignant esophageal tumor in the lower third of the esophagus.  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  Normal duodenum.  Pathology from GE junction mass came back positive for invasive adenocarcinoma, moderately to poorly differentiated.  Immunostains pending.   CT chest abdomen pelvis on 05/28/2023 showed abnormal wall thickening at the GE junction, compatible with patient's known carcinoma.  Prominent lymph nodes in the retrocrural region on the right side, adjacent to the hiatal hernia.  Abnormal low-density soft tissue in the gastrohepatic region, abutting the lesser curvature and proximal stomach, worrisome for metastatic disease.  Periportal, periceliac, gastrosplenic and left retroperitoneal lymphadenopathy, worrisome for metastatic involvement.  Hypodensities in the liver were also noted, worrisome for metastatic disease, largest 1 measuring 13 mm in the inferior right lobe.  Fat stranding surrounding the body and tail of pancreas, suggesting acute pancreatitis.   We were consulted during his hospitalization on 05/29/2023 for additional recommendations given new diagnosis of GE junction adenocarcinoma.   On 06/07/2023, staging PET/CT showed large hypermetabolic mass involving the distal esophagus and proximal stomach consistent with known primary esophageal carcinoma.  Multiple hypermetabolic retroperitoneal, lower mediastinal lymph nodes consistent with locally advanced disease.  Suspected direct tumor extension into the pancreatic body.  Hypermetabolic node or peritoneal  implant within the omentum.  No other evidence of metastatic disease.   Plan was for palliative systemic treatments with FOLFOX plus nivolumab .  However patient's performance status continued to decline and he had to be hospitalized again on 06/14/2023.  He finally presented to reestablish care in our clinic on 07/18/2023.  His performance status improved with nutrition via J-tube.  Started systemic treatments from 07/23/2023.  We did dose reduce oxaliplatin  by 20% and proceeded with 68 mg/m dose.  When he presented for cycle 2 on 08/07/2023, leukopenia with white count of 3300, ANC 1100.  We started skipping 5-FU bolus and continue with the dose reduced oxaliplatin .  Restaging PET scan on 09/10/2023 showed excellent response with significant reduction in size and metabolism in the gastric mass.  Resolution of peritoneal metastatic disease.   Completed 6 cycles of FOLFOX plus nivolumab  as of 11/06/2023.  Transitioned to maintenance treatments with 5-FU plus nivolumab  from 11/20/2023.  REVIEW OF SYSTEMS:   Review of Systems - Oncology  All other pertinent systems were reviewed with the patient and are negative.  ALLERGIES: He has no known allergies.  MEDICATIONS:  Current Outpatient Medications  Medication Sig Dispense Refill   albuterol  (PROVENTIL ) (2.5 MG/3ML) 0.083% nebulizer solution Take 3 mLs (2.5 mg total) by nebulization every 6 (six) hours as needed for wheezing or shortness of breath. 75 mL 12   albuterol  (VENTOLIN  HFA) 108 (90 Base) MCG/ACT inhaler Inhale 1-2 puffs into the lungs every 4 (four) hours as needed for wheezing or shortness of breath. 18 g 3  Albuterol  Sulfate 2.5 MG/0.5ML NEBU Use 1 vial in nebulizer as directed every 6 hours as needed for wheezing or shortness of breath 30 mL 3   cyanocobalamin  1000 MCG tablet Take 1 tablet (1,000 mcg total) by mouth daily. 30 tablet 6   folic acid  (FOLVITE ) 1 MG tablet Take 1 tablet (1 mg total) by mouth daily. 30 tablet 6   methadone   (DOLOPHINE ) 5 MG tablet Take 1 tablet (5 mg total) by mouth daily. 30 tablet 0   metoCLOPramide  (REGLAN ) 10 MG tablet Take 1 tablet (10 mg total) by mouth 4 (four) times daily  before meals and at bedtime. 120 tablet 3   ondansetron  (ZOFRAN -ODT) 8 MG disintegrating tablet Dissolve 1 tablet (8 mg total) by mouth every 8 (eight) hours as needed for nausea or vomiting. 30 tablet 3   pantoprazole  (PROTONIX ) 40 MG tablet Take 1 tablet (40 mg total) by mouth 2 (two) times daily. 60 tablet 1   potassium chloride  SA (KLOR-CON  M) 20 MEQ tablet Take 1 tablet (20 mEq total) by mouth 2 (two) times daily. 60 tablet 3   prochlorperazine  (COMPAZINE ) 10 MG tablet Take 1 tablet (10 mg total) by mouth every 6 (six) hours as needed for nausea or vomiting. 30 tablet 1   sucralfate  (CARAFATE ) 1 GM/10ML suspension Take 10 mLs (1 g total) by mouth 4 (four) times daily -  with meals and at bedtime. 1200 mL 2   traZODone  (DESYREL ) 50 MG tablet Take 1 tablet (50 mg total) by mouth at bedtime. 30 tablet 3   Water  For Irrigation, Sterile (FREE WATER ) SOLN Place 100 mLs into feeding tube 4 (four) times daily. 12000 mL 1   dronabinol  (MARINOL ) 5 MG capsule Take 1 capsule (5 mg total) by mouth 2 (two) times daily before lunch and supper. (Patient not taking: Reported on 10/09/2023) 60 capsule 1   feeding supplement (ENSURE ENLIVE / ENSURE PLUS) LIQD Take as directed (Patient not taking: Reported on 11/20/2023) 948 mL 12   furosemide  (LASIX ) 20 MG tablet Take 1 tablet (20 mg total) by mouth daily as needed for fluid or edema. (Patient not taking: Reported on 11/20/2023) 30 tablet 3   hydrocortisone  (CORTEF ) 10 MG tablet Take 2 tablets by mouth every morning and 1 tablet every evening for one week, then one tablet twice daily (Patient not taking: Reported on 11/20/2023) 50 tablet 1   HYDROmorphone  (DILAUDID ) 2 MG tablet Take 1 tablet (2 mg total) by mouth every 6 (six) hours as needed for severe pain (pain score 7-10). (Patient not taking:  Reported on 11/20/2023) 60 tablet 0   lidocaine -prilocaine  (EMLA ) cream Apply to affected area once (Patient not taking: Reported on 10/09/2023) 30 g 3   Nutritional Supplements (FEEDING SUPPLEMENT, OSMOLITE 1.5 CAL,) LIQD At 60 mL/h over 18 hours on a daily basis. (Patient not taking: Reported on 10/09/2023) 30000 mL 0   polyethylene glycol powder (GLYCOLAX /MIRALAX ) 17 GM/SCOOP powder Dissolve 17 g in 4 oz of liquid and take by mouth daily. (Patient not taking: Reported on 10/09/2023) 476 g 0   senna-docusate (SENOKOT-S) 8.6-50 MG tablet Take 2 tablets by mouth at bedtime. (Patient not taking: Reported on 10/09/2023) 60 tablet 2   No current facility-administered medications for this visit.   Facility-Administered Medications Ordered in Other Visits  Medication Dose Route Frequency Provider Last Rate Last Admin   sodium chloride  flush (NS) 0.9 % injection 10 mL  10 mL Intracatheter PRN Sabian Kuba, MD   10 mL at 11/20/23 1336  VITALS:   Blood pressure 119/86, pulse 64, temperature 98.2 F (36.8 C), temperature source Temporal, resp. rate 17, weight 138 lb 9.6 oz (62.9 kg), SpO2 95%.  Wt Readings from Last 3 Encounters:  11/20/23 138 lb 9.6 oz (62.9 kg)  11/06/23 133 lb 4.8 oz (60.5 kg)  10/30/23 127 lb 3.2 oz (57.7 kg)    Body mass index is 22.37 kg/m.   Onc Performance Status - 11/20/23 1121       ECOG Perf Status   ECOG Perf Status Capable of only limited selfcare, confined to bed or chair more than 50% of waking hours      KPS SCALE   KPS % SCORE Requires considerable assistance, and frequent medical care              PHYSICAL EXAM:   Physical Exam Constitutional:      General: He is not in acute distress.    Appearance: Normal appearance.  HENT:     Head: Normocephalic and atraumatic.  Eyes:     General: No scleral icterus.    Conjunctiva/sclera: Conjunctivae normal.  Cardiovascular:     Rate and Rhythm: Normal rate and regular rhythm.     Heart sounds: Normal  heart sounds.  Pulmonary:     Effort: Pulmonary effort is normal.     Breath sounds: Normal breath sounds.  Chest:     Comments: Port-A-Cath in place without signs of infection Abdominal:     General: There is no distension.  Lymphadenopathy:     Cervical: No cervical adenopathy.  Neurological:     General: No focal deficit present.     Mental Status: He is oriented to person, place, and time.  Psychiatric:        Mood and Affect: Mood normal.        Behavior: Behavior normal.      LABORATORY DATA:   I have reviewed the data as listed.  Results for orders placed or performed in visit on 11/20/23  CBC with Differential (Cancer Center Only)  Result Value Ref Range   WBC Count 2.9 (L) 4.0 - 10.5 K/uL   RBC 3.64 (L) 4.22 - 5.81 MIL/uL   Hemoglobin 9.6 (L) 13.0 - 17.0 g/dL   HCT 40.9 (L) 81.1 - 91.4 %   MCV 81.6 80.0 - 100.0 fL   MCH 26.4 26.0 - 34.0 pg   MCHC 32.3 30.0 - 36.0 g/dL   RDW 78.2 (H) 95.6 - 21.3 %   Platelet Count 158 150 - 400 K/uL   nRBC 0.0 0.0 - 0.2 %   Neutrophils Relative % 46 %   Neutro Abs 1.3 (L) 1.7 - 7.7 K/uL   Lymphocytes Relative 35 %   Lymphs Abs 1.0 0.7 - 4.0 K/uL   Monocytes Relative 13 %   Monocytes Absolute 0.4 0.1 - 1.0 K/uL   Eosinophils Relative 6 %   Eosinophils Absolute 0.2 0.0 - 0.5 K/uL   Basophils Relative 0 %   Basophils Absolute 0.0 0.0 - 0.1 K/uL   Immature Granulocytes 0 %   Abs Immature Granulocytes 0.00 0.00 - 0.07 K/uL  CMP (Cancer Center only)  Result Value Ref Range   Sodium 138 135 - 145 mmol/L   Potassium 4.0 3.5 - 5.1 mmol/L   Chloride 104 98 - 111 mmol/L   CO2 31 22 - 32 mmol/L   Glucose, Bld 87 70 - 99 mg/dL   BUN 8 6 - 20 mg/dL   Creatinine 0.86 5.78 -  1.24 mg/dL   Calcium  8.5 (L) 8.9 - 10.3 mg/dL   Total Protein 5.8 (L) 6.5 - 8.1 g/dL   Albumin 3.3 (L) 3.5 - 5.0 g/dL   AST 15 15 - 41 U/L   ALT 11 0 - 44 U/L   Alkaline Phosphatase 60 38 - 126 U/L   Total Bilirubin 0.4 0.0 - 1.2 mg/dL   GFR, Estimated >47  >82 mL/min   Anion gap 3 (L) 5 - 15      RADIOGRAPHIC STUDIES:  No recent pertinent imaging available to review.  CODE STATUS:  Code Status History     Date Active Date Inactive Code Status Order ID Comments User Context   06/14/2023 1621 07/11/2023 2225 Full Code [id number]  Danice Dural, MD Inpatient   06/13/2023 1253 06/14/2023 0509 Full Code [id number]  Elene Griffes, MD [facility]   05/27/2023 1516 05/30/2023 1651 Full Code [id number]  Gaylin Ke, MD ED    Questions for Most Recent Historical Code Status (Order [id number])     Question Answer   By: Consent: discussion documented in EHR            No orders of the defined types were placed in this encounter.    Future Appointments  Date Time Provider Department Center  11/22/2023  2:15 PM [facility] FLUSH [facility] None  12/04/2023 11:15 AM [facility] FLUSH [facility] None  12/04/2023 11:45 AM Martyna Thorns, MD [facility] None  12/04/2023 12:30 PM [facility] INFUSION [facility] None  12/04/2023  2:00 PM [facility] PALLIATIVE CARE [facility] None  12/06/2023  2:30 PM [facility] FLUSH [facility] None  12/18/2023 11:15 AM [facility] FLUSH [facility] None  12/18/2023 11:45 AM Ifeanyi Mickelson, MD [facility] None  12/18/2023 12:30 PM [facility] INFUSION [facility] None  12/18/2023  1:45 PM Woody Heading, RD [facility] None  12/20/2023  2:15 PM [facility] FLUSH [facility] None      This document was completed utilizing speech recognition software. Grammatical errors, random word insertions, pronoun errors, and incomplete sentences are an occasional consequence of this system due to software limitations, ambient noise, and hardware issues. Any formal questions or concerns about the content, text or information contained within the body of this dictation should be directly addressed to the provider for clarification.

## 2023-11-22 ENCOUNTER — Inpatient Hospital Stay

## 2023-11-27 ENCOUNTER — Inpatient Hospital Stay

## 2023-11-27 ENCOUNTER — Encounter: Payer: Self-pay | Admitting: Oncology

## 2023-11-27 NOTE — Progress Notes (Signed)
 Received referral from Nadiyah in social work regarding follow up with [employer].  Called Aleen Ammons whom assists patient with his personal matters. Advised what is needed to apply and she verbalized understanding.  She has my card for any additional financial questions or concerns.

## 2023-11-27 NOTE — Progress Notes (Signed)
 [facility] CSW Progress Note  Visual merchandiser spoke with patient and then caregiver, per patient's request. Patient's mother-in-law serves as caregiver per patient. Caregiver reported daughter is assisting with SNAP re certification, no additional needs or questions regarding SNAP.    Patient's caregiver expressed concerned for co-pays at [facility], questions about approval for [employer]. Per chart, patient needed to complete for Patient Financial Specialist to complete application with Caregiver. Message sent to Patient financial Specialist for guidance. Family is aware that ROI should be completed at next appointment.   Maudie Sorrow, LCSW Clinical Social Worker [facility]

## 2023-12-02 ENCOUNTER — Other Ambulatory Visit (HOSPITAL_COMMUNITY): Payer: Self-pay

## 2023-12-04 ENCOUNTER — Inpatient Hospital Stay: Attending: Internal Medicine | Admitting: Oncology

## 2023-12-04 ENCOUNTER — Other Ambulatory Visit (HOSPITAL_COMMUNITY): Payer: Self-pay

## 2023-12-04 ENCOUNTER — Ambulatory Visit

## 2023-12-04 ENCOUNTER — Other Ambulatory Visit: Payer: Self-pay | Admitting: Nurse Practitioner

## 2023-12-04 ENCOUNTER — Encounter: Payer: Self-pay | Admitting: Oncology

## 2023-12-04 ENCOUNTER — Encounter: Payer: Self-pay | Admitting: Nurse Practitioner

## 2023-12-04 ENCOUNTER — Inpatient Hospital Stay

## 2023-12-04 ENCOUNTER — Inpatient Hospital Stay (HOSPITAL_BASED_OUTPATIENT_CLINIC_OR_DEPARTMENT_OTHER): Admitting: Nurse Practitioner

## 2023-12-04 ENCOUNTER — Other Ambulatory Visit: Payer: Self-pay

## 2023-12-04 VITALS — BP 106/70 | HR 62 | Temp 98.0°F | Resp 16 | Ht 66.0 in | Wt 132.0 lb

## 2023-12-04 DIAGNOSIS — R630 Anorexia: Secondary | ICD-10-CM

## 2023-12-04 DIAGNOSIS — Z515 Encounter for palliative care: Secondary | ICD-10-CM

## 2023-12-04 DIAGNOSIS — G893 Neoplasm related pain (acute) (chronic): Secondary | ICD-10-CM

## 2023-12-04 DIAGNOSIS — C160 Malignant neoplasm of cardia: Secondary | ICD-10-CM | POA: Diagnosis present

## 2023-12-04 DIAGNOSIS — Z95828 Presence of other vascular implants and grafts: Secondary | ICD-10-CM

## 2023-12-04 DIAGNOSIS — Z79899 Other long term (current) drug therapy: Secondary | ICD-10-CM | POA: Diagnosis not present

## 2023-12-04 DIAGNOSIS — R530 Neoplastic (malignant) related fatigue: Secondary | ICD-10-CM

## 2023-12-04 DIAGNOSIS — R627 Adult failure to thrive: Secondary | ICD-10-CM | POA: Diagnosis not present

## 2023-12-04 DIAGNOSIS — Z5111 Encounter for antineoplastic chemotherapy: Secondary | ICD-10-CM | POA: Insufficient documentation

## 2023-12-04 DIAGNOSIS — R634 Abnormal weight loss: Secondary | ICD-10-CM

## 2023-12-04 LAB — CBC WITH DIFFERENTIAL (CANCER CENTER ONLY)
Abs Immature Granulocytes: 0.02 10*3/uL (ref 0.00–0.07)
Basophils Absolute: 0 10*3/uL (ref 0.0–0.1)
Basophils Relative: 1 %
Eosinophils Absolute: 0.1 10*3/uL (ref 0.0–0.5)
Eosinophils Relative: 1 %
HCT: 33 % — ABNORMAL LOW (ref 39.0–52.0)
Hemoglobin: 10.5 g/dL — ABNORMAL LOW (ref 13.0–17.0)
Immature Granulocytes: 0 %
Lymphocytes Relative: 21 %
Lymphs Abs: 1.1 10*3/uL (ref 0.7–4.0)
MCH: 26.1 pg (ref 26.0–34.0)
MCHC: 31.8 g/dL (ref 30.0–36.0)
MCV: 82.1 fL (ref 80.0–100.0)
Monocytes Absolute: 0.7 10*3/uL (ref 0.1–1.0)
Monocytes Relative: 12 %
Neutro Abs: 3.5 10*3/uL (ref 1.7–7.7)
Neutrophils Relative %: 65 %
Platelet Count: 185 10*3/uL (ref 150–400)
RBC: 4.02 MIL/uL — ABNORMAL LOW (ref 4.22–5.81)
RDW: 17.3 % — ABNORMAL HIGH (ref 11.5–15.5)
WBC Count: 5.4 10*3/uL (ref 4.0–10.5)
nRBC: 0 % (ref 0.0–0.2)

## 2023-12-04 LAB — CMP (CANCER CENTER ONLY)
ALT: 11 U/L (ref 0–44)
AST: 14 U/L — ABNORMAL LOW (ref 15–41)
Albumin: 3.4 g/dL — ABNORMAL LOW (ref 3.5–5.0)
Alkaline Phosphatase: 64 U/L (ref 38–126)
Anion gap: 4 — ABNORMAL LOW (ref 5–15)
BUN: 6 mg/dL (ref 6–20)
CO2: 28 mmol/L (ref 22–32)
Calcium: 8.5 mg/dL — ABNORMAL LOW (ref 8.9–10.3)
Chloride: 105 mmol/L (ref 98–111)
Creatinine: 0.47 mg/dL — ABNORMAL LOW (ref 0.61–1.24)
GFR, Estimated: >60 mL/min (ref >60)
Glucose, Bld: 91 mg/dL (ref 70–99)
Potassium: 3.8 mmol/L (ref 3.5–5.1)
Sodium: 137 mmol/L (ref 135–145)
Total Bilirubin: 0.4 mg/dL (ref 0.0–1.2)
Total Protein: 6.3 g/dL — ABNORMAL LOW (ref 6.5–8.1)

## 2023-12-04 MED ORDER — SODIUM CHLORIDE 0.9% FLUSH
10.0000 mL | INTRAVENOUS | Status: DC | PRN
  Administered 2023-12-04: 10 mL

## 2023-12-04 MED ORDER — PALONOSETRON HCL INJECTION 0.25 MG/5ML
0.2500 mg | Freq: Once | INTRAVENOUS | Status: AC
  Administered 2023-12-04: 0.25 mg via INTRAVENOUS

## 2023-12-04 MED ORDER — SODIUM CHLORIDE 0.9 % IV SOLN
240.0000 mg | Freq: Once | INTRAVENOUS | Status: AC
  Administered 2023-12-04: 240 mg via INTRAVENOUS
  Filled 2023-12-04: qty 24

## 2023-12-04 MED ORDER — DEXTROSE 5 % IV SOLN: INTRAVENOUS | Status: DC

## 2023-12-04 MED ORDER — TRAZODONE HCL 100 MG PO TABS
50.0000 mg | ORAL_TABLET | Freq: Every day | ORAL | 3 refills | Status: DC
Start: 2023-12-04 — End: 2024-01-29
  Filled 2023-12-04: qty 30, 60d supply, fill #0
  Filled 2024-01-02 – 2024-01-08 (×4): qty 30, 60d supply, fill #1

## 2023-12-04 MED ORDER — DEXAMETHASONE SODIUM PHOSPHATE 10 MG/ML IJ SOLN
10.0000 mg | Freq: Once | INTRAMUSCULAR | Status: DC
Start: 2023-12-04 — End: 2023-12-04

## 2023-12-04 MED ORDER — SODIUM CHLORIDE 0.9 % IV SOLN: INTRAVENOUS | Status: DC

## 2023-12-04 MED ORDER — SODIUM CHLORIDE 0.9% FLUSH
10.0000 mL | Freq: Once | INTRAVENOUS | Status: AC
  Administered 2023-12-04: 10 mL

## 2023-12-04 MED ORDER — SODIUM CHLORIDE 0.9 % IV SOLN
4000.0000 mg | INTRAVENOUS | Status: DC
  Administered 2023-12-04: 4000 mg via INTRAVENOUS
  Filled 2023-12-04: qty 80

## 2023-12-04 MED ORDER — METHADONE HCL 5 MG PO TABS
5.0000 mg | ORAL_TABLET | Freq: Every day | ORAL | 0 refills | Status: DC
Start: 2023-12-04 — End: 2024-01-02
  Filled 2023-12-04: qty 30, 30d supply, fill #0

## 2023-12-04 NOTE — Assessment & Plan Note (Signed)
 Currently on methadone  and Dilaudid  for pain management. Radiation therapy has helped with pain, and there is no significant disease causing pain currently. Pain medications help with sleep, but concern exists that methadone  may affect appetite and mobility. - He is taking methadone  once daily now. - Continue Dilaudid  for breakthrough pain management. He will follow-up with Landa Pine, palliative care, for medication adjustments.

## 2023-12-04 NOTE — Patient Instructions (Signed)
 [facility] - A DEPT OF [facility]  Discharge Instructions: Thank you for choosing [facility] to provide your oncology and hematology care.   If you have a lab appointment with the Cancer Center, please go directly to the Cancer Center and check in at the registration area.   Wear comfortable clothing and clothing appropriate for easy access to any Portacath or PICC line.   We strive to give you quality time with your provider. You may need to reschedule your appointment if you arrive late (15 or more minutes).  Arriving late affects you and other patients whose appointments are after yours.  Also, if you miss three or more appointments without notifying the office, you may be dismissed from the clinic at the provider's discretion.      For prescription refill requests, have your pharmacy contact our office and allow 72 hours for refills to be completed.    Today you received the following chemotherapy and/or immunotherapy agents: Opdivo , 5-FU   To help prevent nausea and vomiting after your treatment, we encourage you to take your nausea medication as directed.  BELOW ARE SYMPTOMS THAT SHOULD BE REPORTED IMMEDIATELY: *FEVER GREATER THAN 100.4 F (38 C) OR HIGHER *CHILLS OR SWEATING *NAUSEA AND VOMITING THAT IS NOT CONTROLLED WITH YOUR NAUSEA MEDICATION *UNUSUAL SHORTNESS OF BREATH *UNUSUAL BRUISING OR BLEEDING *URINARY PROBLEMS (pain or burning when urinating, or frequent urination) *BOWEL PROBLEMS (unusual diarrhea, constipation, pain near the anus) TENDERNESS IN MOUTH AND THROAT WITH OR WITHOUT PRESENCE OF ULCERS (sore throat, sores in mouth, or a toothache) UNUSUAL RASH, SWELLING OR PAIN  UNUSUAL VAGINAL DISCHARGE OR ITCHING   Items with * indicate a potential emergency and should be followed up as soon as possible or go to the Emergency Department if any problems should occur.  Please show the CHEMOTHERAPY ALERT CARD or IMMUNOTHERAPY  ALERT CARD at check-in to the Emergency Department and triage nurse.  Should you have questions after your visit or need to cancel or reschedule your appointment, please contact [facility]  Dept: [phone]  and follow the prompts.  Office hours are 8:00 a.m. to 4:30 p.m. Monday - Friday. Please note that voicemails left after 4:00 p.m. may not be returned until the following business day.  We are closed weekends and major holidays. You have access to a nurse at all times for urgent questions. Please call the main number to the clinic Dept: [phone] and follow the prompts.   For any non-urgent questions, you may also contact your provider using MyChart. We now offer e-Visits for anyone 14 and older to request care online for non-urgent symptoms. For details visit [email or web address].   Also download the MyChart app! Go to the app store, search "MyChart", open the app, select [place], and log in with your MyChart username and password.

## 2023-12-04 NOTE — Progress Notes (Signed)
 Patient seen by Dr. Archie Patten Pasam today  Vitals are within treatment parameters:Yes   Labs are within treatment parameters: Yes   Treatment plan has been signed: Yes   Per physician team, Patient is ready for treatment and there are NO modifications to the treatment plan.

## 2023-12-04 NOTE — Progress Notes (Signed)
 Ok to delete dex from premeds as pt is now only on 5-FU pump and Opdivo . Also ok to resume 5-FU today.  Jesus Haynes, PharmD, MBA

## 2023-12-04 NOTE — Progress Notes (Signed)
 Palliative Medicine [facility]  [phone] [phone]   Name: Jesus Haynes Date: 12/04/2023 MRN: [id number]  DOB: 15-Nov-1975  Patient Care Team: Jerrlyn Morel, NP as PCP - General (Pulmonary Disease) Arlo Berber, MD as Consulting Physician (Oncology) Ozell Blunt, MD as Consulting Physician (Gastroenterology) Pickenpack-Cousar, Giles Labrum, NP as Nurse Practitioner (Hospice and Palliative Medicine)    INTERVAL HISTORY: Jesus Haynes is a 48 y.o. 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 Palliative ask to see for symptom management and goals of care.   SOCIAL HISTORY:     reports that he quit smoking about 10 years ago. His smoking use included cigarettes. He has never used smokeless tobacco. He reports that he does not drink alcohol and does not use drugs.  ADVANCE DIRECTIVES:  None on file   CODE STATUS: Full code  PAST MEDICAL HISTORY: Past Medical History:  Diagnosis Date   Asthma     ALLERGIES:  has no known allergies.  MEDICATIONS:  Current Outpatient Medications  Medication Sig Dispense Refill   albuterol  (PROVENTIL ) (2.5 MG/3ML) 0.083% nebulizer solution Take 3 mLs (2.5 mg total) by nebulization every 6 (six) hours as needed for wheezing or shortness of breath. 75 mL 12   albuterol  (VENTOLIN  HFA) 108 (90 Base) MCG/ACT inhaler Inhale 1-2 puffs into the lungs every 4 (four) hours as needed for wheezing or shortness of breath. 18 g 3   Albuterol  Sulfate 2.5 MG/0.5ML NEBU Use 1 vial in nebulizer as directed every 6 hours as needed for wheezing or shortness of breath 30 mL 3   cyanocobalamin  1000 MCG tablet Take 1 tablet (1,000 mcg total) by mouth daily. 30 tablet 6   dronabinol  (MARINOL ) 5 MG capsule Take 1 capsule (5 mg total) by mouth 2 (two) times daily before lunch and supper. 60 capsule 1    feeding supplement (ENSURE ENLIVE / ENSURE PLUS) LIQD Take as directed (Patient not taking: Reported on 10/02/2023) 948 mL 12   folic acid  (FOLVITE ) 1 MG tablet Take 1 tablet (1 mg total) by mouth daily. 30 tablet 6   furosemide  (LASIX ) 20 MG tablet Take 1 tablet (20 mg total) by mouth daily as needed for fluid or edema. 30 tablet 3   hydrocortisone  (CORTEF ) 10 MG tablet Take 2 tablets by mouth every morning and 1 tablet every evening for one week, then one tablet twice daily 50 tablet 1   HYDROmorphone  (DILAUDID ) 2 MG tablet Take 1 tablet (2 mg total) by mouth every 6 (six) hours as needed for severe pain (pain score 7-10). (Patient not taking: Reported on 11/20/2023) 60 tablet 0   lidocaine -prilocaine  (EMLA ) cream Apply to affected area once (Patient not taking: Reported on 10/09/2023) 30 g 3   methadone  (DOLOPHINE ) 5 MG tablet Take 1 tablet (5 mg total) by mouth daily. 30 tablet 0   metoCLOPramide  (REGLAN ) 10 MG tablet Take 1 tablet (10 mg total) by mouth 4 (four) times daily  before meals and at bedtime. 120 tablet 3   Nutritional Supplements (FEEDING SUPPLEMENT, OSMOLITE 1.5 CAL,) LIQD At 60 mL/h over 18 hours on a daily basis. (Patient not taking: Reported on 10/09/2023) 30000 mL 0   ondansetron  (ZOFRAN -ODT) 8 MG disintegrating tablet Dissolve 1 tablet (8 mg total) by mouth every 8 (eight) hours as needed for nausea or vomiting. 30 tablet 3   pantoprazole  (PROTONIX ) 40 MG tablet Take  1 tablet (40 mg total) by mouth 2 (two) times daily. 60 tablet 1   polyethylene glycol powder (GLYCOLAX /MIRALAX ) 17 GM/SCOOP powder Dissolve 17 g in 4 oz of liquid and take by mouth daily. (Patient not taking: Reported on 10/09/2023) 476 g 0   potassium chloride  SA (KLOR-CON  M) 20 MEQ tablet Take 1 tablet (20 mEq total) by mouth 2 (two) times daily. 60 tablet 3   prochlorperazine  (COMPAZINE ) 10 MG tablet Take 1 tablet (10 mg total) by mouth every 6 (six) hours as needed for nausea or vomiting. 30 tablet 1   senna-docusate  (SENOKOT-S) 8.6-50 MG tablet Take 2 tablets by mouth at bedtime. (Patient not taking: Reported on 10/09/2023) 60 tablet 2   sucralfate  (CARAFATE ) 1 GM/10ML suspension Take 10 mLs (1 g total) by mouth 4 (four) times daily -  with meals and at bedtime. 1200 mL 2   traZODone  (DESYREL ) 100 MG tablet Take 0.5 tablets (50 mg total) by mouth at bedtime. 30 tablet 3   Water  For Irrigation, Sterile (FREE WATER ) SOLN Place 100 mLs into feeding tube 4 (four) times daily. (Patient not taking: Reported on 12/04/2023) 12000 mL 1   No current facility-administered medications for this visit.   Facility-Administered Medications Ordered in Other Visits  Medication Dose Route Frequency Provider Last Rate Last Admin   0.9 %  sodium chloride  infusion   Intravenous Continuous Pasam, Avinash, MD   Stopped at 12/04/23 1411   dextrose  5 % solution   Intravenous Continuous Pasam, Avinash, MD       fluorouracil  (ADRUCIL ) 4,000 mg in sodium chloride  0.9 % 70 mL chemo infusion  4,000 mg Intravenous 1 day or 1 dose Pasam, Avinash, MD   Infusion Verify at 12/04/23 1426   sodium chloride  flush (NS) 0.9 % injection 10 mL  10 mL Intracatheter PRN Pasam, Avinash, MD   10 mL at 12/04/23 1411    VITAL SIGNS: There were no vitals taken for this visit. There were no vitals filed for this visit.  Estimated body mass index is 21.31 kg/m as calculated from the following:   Height as of an earlier encounter on 12/04/23: 5\' 6"  (1.676 m).   Weight as of an earlier encounter on 12/04/23: 132 lb (59.9 kg).  PERFORMANCE STATUS (ECOG) : 1 - Symptomatic but completely ambulatory  Physical Exam General: NAD Cardiovascular: regular rate and rhythm Pulmonary: normal breathing pattern Extremities: no edema, no joint deformities Skin: no rashes Neurological: AAO x3  IMPRESSION: Discussed the use of AI scribe software for clinical note transcription with the patient, who gave verbal consent to proceed. History of Present Illness A 48 year old  male with cancer who was seen during his infusion for symptom management follow-up. He is doing well overall. Much better than previous visits. He denies concerns for nausea, vomiting, constipation, or diarrhea.   He notes a decrease in weight and mentions that his appetite has been fairly good, although he has lost weight recently. He has been consuming a little bit of protein drinks like Ensure or Boost, but not much, as they sit heavy on his stomach and cause discomfort. His weight today is 132 pounds will down from 138 pounds on 5/21.  We will continue to closely monitor.  Reports his insomnia is much improved with trazodone  100 mg.  Requesting refill for increased dose.  Yader reports his pain is well-managed on current regimen which includes methadone  5 mg once daily.  We have been able to significantly wean down his  pain medication use.  No longer taking hydromorphone .  No adjustments to current regimen at this time.  We will continue to closely monitor and support.  I discussed the importance of continued conversation with family and their medical providers regarding overall plan of care and treatment options, ensuring decisions are within the context of the patients values and GOCs. Assessment & Plan Weight loss Weight decreased from 138 lbs to 132 lbs. Gastrointestinal discomfort limits protein drink intake. Weight loss concerning, requires monitoring. - Encourage dietary intake to prevent further weight loss.  Insomnia Insomnia managed with trazodone . Transitioning to single 100 mg tablet for convenience and adherence. - Prescribe trazodone  100 mg tablet, instruct to take one tablet instead of two.  I will plan to see patient back in 4-6 weeks.  Sooner if needed.  Patient expressed understanding and was in agreement with this plan. He also understands that He can call the clinic at any time with any questions, concerns, or complaints.   Any controlled substances utilized were  prescribed in the context of palliative care. PDMP has been reviewed.   Visit consisted of counseling and education dealing with the complex and emotionally intense issues of symptom management and palliative care in the setting of serious and potentially life-threatening illness.  Dellia Ferguson, AGPCNP-BC  Palliative Medicine Team/[facility]

## 2023-12-04 NOTE — Assessment & Plan Note (Addendum)
-  Please review HPI/oncology history for additional details and timeline of events.  -Reviewed staging PET/CT findings with the patient previously.  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  Discussed treatment options. All treatment options are palliative in nature and not curative intent. Plan made to proceed with palliative systemic treatment using FOLFOX plus nivolumab .   -He started palliative systemic treatments with FOLFOX plus nivolumab  from 07/23/2023.  He was admitted to the hospital on 07/30/2023 after he presented with chest and epigastric pain, episodes of melena. He was found to have hemoglobin of 6.8. He was admitted for blood transfusion and further evaluation.  GI deferred additional workup/EGD since the cause of blood loss seemed obvious from malignancy. He was discharged home on 08/02/2023.   -He resumed systemic treatments with cycle 2 of FOLFOX plus nivolumab  on 08/07/2023 without 5-FU bolus and a 20% dose reduction of oxaliplatin  and tolerated the chemotherapy and immunotherapy reasonably well.  Did not experience major side effects.  Recent lower extremity ultrasound to rule out DVT showed possible inguinal lymphadenopathy.  Hence PET/CT was obtained for further evaluation on 09/10/2023.  It showed significant improvement with decreased size and metabolic some of the gastric mass.  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  Overall PET scan showing encouraging results.  Hence plan made to continue current management with dose reduced FOLFOX and nivolumab  for up to 6 cycles followed by maintenance treatments.  He received cycle 6 of FOLFOX plus nivolumab  on 11/06/2023. Following cycle 6, plan was to transition him to maintenance treatments with 5-FU and nivolumab , to be continued every 2 weeks.  Only if intolerable side effects are noted to chemotherapy, then we will transition him to  maintenance treatment with immunotherapy alone.  Clinically he is doing well overall.  Labs today showed normal white count and stable hemoglobin.  We will proceed with maintenance treatment using 5-FU infusion via pump and nivolumab  treatment today.  Restaging PET scan will be obtained approximately in mid July.  To be ordered on future visits.  RTC in 2 weeks for labs, office visit and continuation of treatments.

## 2023-12-04 NOTE — Progress Notes (Signed)
 Patient provided documents for [employer] and completed paperwork. Patient approved for one-time $1000 [employer] grant to assist with personal expenses while going through treatment. He submitted an expense today and received a gas card from his grant.  My card was given for any additional financial questions or concerns.

## 2023-12-04 NOTE — Progress Notes (Signed)
 [facility]  ONCOLOGY CLINIC PROGRESS NOTE   Patient Care Team: Jerrlyn Morel, NP as PCP - General (Pulmonary Disease) Arlo Berber, MD as Consulting Physician (Oncology) Ozell Blunt, MD as Consulting Physician (Gastroenterology) Pickenpack-Cousar, Giles Labrum, NP as Nurse Practitioner [facility])  PATIENT NAME: Jesus Haynes   MR#: [id number] DOB: Jan 02, 1976  Date of visit: 12/04/2023   ASSESSMENT & PLAN:   Jesus Haynes is a 48 y.o. pleasant gentleman with history of asthma, presented to the ED on 05/27/2023 with complaints of epigastric burning abdominal pain, nausea, retching.  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  Adenocarcinoma of gastroesophageal junction (HCC) -Please review HPI/oncology history for additional details and timeline of events.  -Reviewed staging PET/CT findings with the patient previously.  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  Discussed treatment options. All treatment options are palliative in nature and not curative intent. Plan made to proceed with palliative systemic treatment using FOLFOX plus nivolumab .   -He started palliative systemic treatments with FOLFOX plus nivolumab  from 07/23/2023.  He was admitted to the hospital on 07/30/2023 after he presented with chest and epigastric pain, episodes of melena. He was found to have hemoglobin of 6.8. He was admitted for blood transfusion and further evaluation.  GI deferred additional workup/EGD since the cause of blood loss seemed obvious from malignancy. He was discharged home on 08/02/2023.   -He resumed systemic treatments with cycle 2 of FOLFOX plus nivolumab  on 08/07/2023 without 5-FU bolus and a 20% dose reduction of  oxaliplatin  and tolerated the chemotherapy and immunotherapy reasonably well.  Did not experience major side effects.  Recent lower extremity ultrasound to rule out DVT showed possible inguinal lymphadenopathy.  Hence PET/CT was obtained for further evaluation on 09/10/2023.  It showed significant improvement with decreased size and metabolic some of the gastric mass.  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  Overall PET scan showing encouraging results.  Hence plan made to continue current management with dose reduced FOLFOX and nivolumab  for up to 6 cycles followed by maintenance treatments.  He received cycle 6 of FOLFOX plus nivolumab  on 11/06/2023. Following cycle 6, plan was to transition him to maintenance treatments with 5-FU and nivolumab , to be continued every 2 weeks.  Only if intolerable side effects are noted to chemotherapy, then we will transition him to maintenance treatment with immunotherapy alone.  Clinically he is doing well overall.  Labs today showed normal white count and stable hemoglobin.  We will proceed with maintenance treatment using 5-FU infusion via pump and nivolumab  treatment today.  Restaging PET scan will be obtained approximately in mid July.  To be ordered on future visits.  RTC in 2 weeks for labs, office visit and continuation of treatments.  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 Radiation therapy has helped with pain, and there is no significant disease causing pain currently. Pain medications help with sleep, but concern exists that methadone  may affect appetite and mobility. - He is taking methadone  once daily now. - Continue Dilaudid  for breakthrough pain management. He will follow-up with Landa Pine, palliative care, for medication adjustments.     I reviewed lab results and outside records for this visit and discussed relevant results with the patient. Diagnosis, plan of care and  treatment options were  also discussed in detail with the patient. Opportunity provided to ask questions and answers provided to his apparent satisfaction. Provided instructions to call our clinic with any problems, questions or concerns prior to return visit. I recommended to continue follow-up with PCP and sub-specialists. He verbalized understanding and agreed with the plan.   NCCN guidelines have been consulted in the planning of this patient's care.  I spent a total of 30 minutes during this encounter with the patient including review of chart and various tests results, discussions about plan of care and coordination of care plan.   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  INTERVAL HISTORY:   Discussed the use of AI scribe software for clinical note transcription with the patient, who gave verbal consent to proceed.  History of Present Illness  Jesus Haynes is a 48 year old male who presents for chemotherapy treatment.  He has experienced weight loss, which he attributes partly to the cessation of his food stamps. He is working with a Child psychotherapist to resolve this issue. Despite these challenges, he is focusing on staying active, eating well, and maintaining hydration.  He has experienced some episodes of dry heaving but no significant nausea or vomiting. He is currently taking nausea medications four times a day, including Reglan , ondansetron  as needed, and Compazine  as needed. He no longer requires a specific medication that was used for three days post-chemotherapy when he was  receiving full chemotherapy.  His recent blood work shows improvement with a white blood cell count of 5,400, up from 2,900, and a hemoglobin level of 10.5. Platelets are normal, and other labs, including kidney and liver function tests, are within normal limits. Albumin is noted to be improving at 3.4.  He completed a higher intensity chemotherapy regimen on May 9th after a break in April due to the effects of radiation, which left him feeling extremely weak. He recalls being sick around [facility], which was approximately two weeks after his last radiation session.  He is currently taking methadone  once a day for pain management, which he reports is under control.    I have reviewed the past medical history, past surgical history, social history and family history with the patient and they are unchanged from previous note.  HISTORY OF PRESENT ILLNESS:   Oncology History  Adenocarcinoma of gastroesophageal junction (HCC)  05/29/2023 Initial Diagnosis   Adenocarcinoma of gastroesophageal junction (HCC)   07/18/2023 Cancer Staging   Staging form: Esophagus - Adenocarcinoma, AJCC 8th Edition - Clinical: Stage IVB (cT4, cN3, cM1, G3) - Signed by Arlo Berber, MD on 07/18/2023 Histologic grading system: 3 grade system   07/23/2023 -  Chemotherapy   Patient is on Treatment Plan : GASTROESOPHAGEAL FOLFOX + Nivolumab  q14d       48 y.o. gentleman with history of asthma, presented to the ED on 05/27/2023 with complaints of epigastric burning abdominal pain, nausea, retching.  He also reported some dark tarry stools for 3 days prior to arrival.  He was previously seen in the ED at [facility] on 05/08/2023 with  complaints of chest or right upper quadrant abdominal pain.  Workup was unremarkable at that time and he was sent home with Protonix  and Carafate .  Since his symptoms persisted, he presented to the ED again.   In the ED, his hemoglobin was noted to be down to 10, compared to 13 previously.  With  concern for upper GI bleed, was admitted for further evaluation and management.   Dr. Magod performed upper GI endoscopy on 05/28/2023.  It showed partially obstructing, likely malignant esophageal tumor in the lower third of the esophagus.  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  Normal duodenum.  Pathology from GE junction mass came back positive for invasive adenocarcinoma, moderately to poorly differentiated.  Immunostains pending.   CT chest abdomen pelvis on 05/28/2023 showed abnormal wall thickening at the GE junction, compatible with patient's known carcinoma.  Prominent lymph nodes in the retrocrural region on the right side, adjacent to the hiatal hernia.  Abnormal low-density soft tissue in the gastrohepatic region, abutting the lesser curvature and proximal stomach, worrisome for metastatic disease.  Periportal, periceliac, gastrosplenic and left retroperitoneal lymphadenopathy, worrisome for metastatic involvement.  Hypodensities in the liver were also noted, worrisome for metastatic disease, largest 1 measuring 13 mm in the inferior right lobe.  Fat stranding surrounding the body and tail of pancreas, suggesting acute pancreatitis.   We were consulted during his hospitalization on 05/29/2023 for additional recommendations given new diagnosis of GE junction adenocarcinoma.   On 06/07/2023, staging PET/CT showed large hypermetabolic mass involving the distal esophagus and proximal stomach consistent with known primary esophageal carcinoma.  Multiple hypermetabolic retroperitoneal, lower mediastinal lymph nodes consistent with locally advanced disease.  Suspected direct tumor extension into the pancreatic body.  Hypermetabolic node or peritoneal implant within the omentum.  No other evidence of metastatic disease.   Plan was for palliative systemic treatments with FOLFOX plus nivolumab .  However patient's performance status continued to decline and he had to be hospitalized again on  06/14/2023.  He finally presented to reestablish care in our clinic on 07/18/2023.  His performance status improved with nutrition via J-tube.  Started systemic treatments from 07/23/2023.  We did dose reduce oxaliplatin  by 20% and proceeded with 68 mg/m dose.  When he presented for cycle 2 on 08/07/2023, leukopenia with white count of 3300, ANC 1100.  We started skipping 5-FU bolus and continue with the dose reduced oxaliplatin .  Restaging PET scan on 09/10/2023 showed excellent response with significant reduction in size and metabolism in the gastric mass.  Resolution of peritoneal metastatic disease.   Completed 6 cycles of FOLFOX plus nivolumab  as of 11/06/2023.  Transitioned to maintenance treatments with 5-FU plus nivolumab  from 11/20/2023.  REVIEW OF SYSTEMS:   Review of Systems - Oncology  All other pertinent systems were reviewed with the patient and are negative.  ALLERGIES: He has no known allergies.  MEDICATIONS:  Current Outpatient Medications  Medication Sig Dispense Refill   albuterol  (PROVENTIL ) (2.5 MG/3ML) 0.083% nebulizer solution Take 3 mLs (2.5 mg total) by nebulization every 6 (six) hours as needed for wheezing or shortness of breath. 75 mL 12   albuterol  (VENTOLIN  HFA) 108 (90 Base) MCG/ACT inhaler Inhale 1-2 puffs into the lungs every 4 (four) hours as needed for wheezing or shortness of breath. 18 g 3   Albuterol  Sulfate 2.5 MG/0.5ML NEBU Use 1 vial in nebulizer as directed every 6 hours as needed for wheezing or shortness of breath 30 mL 3   cyanocobalamin  1000 MCG tablet  Take 1 tablet (1,000 mcg total) by mouth daily. 30 tablet 6   dronabinol  (MARINOL ) 5 MG capsule Take 1 capsule (5 mg total) by mouth 2 (two) times daily before lunch and supper. 60 capsule 1   folic acid  (FOLVITE ) 1 MG tablet Take 1 tablet (1 mg total) by mouth daily. 30 tablet 6   furosemide  (LASIX ) 20 MG tablet Take 1 tablet (20 mg total) by mouth daily as needed for fluid or edema. 30 tablet 3    hydrocortisone  (CORTEF ) 10 MG tablet Take 2 tablets by mouth every morning and 1 tablet every evening for one week, then one tablet twice daily 50 tablet 1   metoCLOPramide  (REGLAN ) 10 MG tablet Take 1 tablet (10 mg total) by mouth 4 (four) times daily  before meals and at bedtime. 120 tablet 3   ondansetron  (ZOFRAN -ODT) 8 MG disintegrating tablet Dissolve 1 tablet (8 mg total) by mouth every 8 (eight) hours as needed for nausea or vomiting. 30 tablet 3   pantoprazole  (PROTONIX ) 40 MG tablet Take 1 tablet (40 mg total) by mouth 2 (two) times daily. 60 tablet 1   potassium chloride  SA (KLOR-CON  M) 20 MEQ tablet Take 1 tablet (20 mEq total) by mouth 2 (two) times daily. 60 tablet 3   prochlorperazine  (COMPAZINE ) 10 MG tablet Take 1 tablet (10 mg total) by mouth every 6 (six) hours as needed for nausea or vomiting. 30 tablet 1   sucralfate  (CARAFATE ) 1 GM/10ML suspension Take 10 mLs (1 g total) by mouth 4 (four) times daily -  with meals and at bedtime. 1200 mL 2   feeding supplement (ENSURE ENLIVE / ENSURE PLUS) LIQD Take as directed (Patient not taking: Reported on 10/02/2023) 948 mL 12   HYDROmorphone  (DILAUDID ) 2 MG tablet Take 1 tablet (2 mg total) by mouth every 6 (six) hours as needed for severe pain (pain score 7-10). (Patient not taking: Reported on 11/20/2023) 60 tablet 0   lidocaine -prilocaine  (EMLA ) cream Apply to affected area once (Patient not taking: Reported on 10/09/2023) 30 g 3   methadone  (DOLOPHINE ) 5 MG tablet Take 1 tablet (5 mg total) by mouth daily. 30 tablet 0   Nutritional Supplements (FEEDING SUPPLEMENT, OSMOLITE 1.5 CAL,) LIQD At 60 mL/h over 18 hours on a daily basis. (Patient not taking: Reported on 10/09/2023) 30000 mL 0   polyethylene glycol powder (GLYCOLAX /MIRALAX ) 17 GM/SCOOP powder Dissolve 17 g in 4 oz of liquid and take by mouth daily. (Patient not taking: Reported on 10/09/2023) 476 g 0   senna-docusate (SENOKOT-S) 8.6-50 MG tablet Take 2 tablets by mouth at bedtime. (Patient  not taking: Reported on 10/09/2023) 60 tablet 2   traZODone  (DESYREL ) 100 MG tablet Take 0.5 tablets (50 mg total) by mouth at bedtime. 30 tablet 3   Water  For Irrigation, Sterile (FREE WATER ) SOLN Place 100 mLs into feeding tube 4 (four) times daily. (Patient not taking: Reported on 12/04/2023) 12000 mL 1   No current facility-administered medications for this visit.   Facility-Administered Medications Ordered in Other Visits  Medication Dose Route Frequency Provider Last Rate Last Admin   0.9 %  sodium chloride  infusion   Intravenous Continuous Ia Leeb, MD   Stopped at 12/04/23 1411   dextrose  5 % solution   Intravenous Continuous Cahterine Heinzel, MD       fluorouracil  (ADRUCIL ) 4,000 mg in sodium chloride  0.9 % 70 mL chemo infusion  4,000 mg Intravenous 1 day or 1 dose Alesi Zachery, MD   Infusion Verify at  12/04/23 1426   sodium chloride  flush (NS) 0.9 % injection 10 mL  10 mL Intracatheter PRN Fani Rotondo, MD   10 mL at 12/04/23 1411     VITALS:   Blood pressure 106/70, pulse 62, temperature 98 F (36.7 C), temperature source Temporal, resp. rate 16, height 5\' 6"  (1.676 m), weight 132 lb (59.9 kg), SpO2 98%.  Wt Readings from Last 3 Encounters:  12/04/23 132 lb (59.9 kg)  11/20/23 138 lb 9.6 oz (62.9 kg)  11/06/23 133 lb 4.8 oz (60.5 kg)    Body mass index is 21.31 kg/m.   Onc Performance Status - 12/04/23 1202       ECOG Perf Status   ECOG Perf Status Capable of only limited selfcare, confined to bed or chair more than 50% of waking hours      KPS SCALE   KPS % SCORE Requires occasional assistance but is able to care for most needs               PHYSICAL EXAM:   Physical Exam Constitutional:      General: He is not in acute distress.    Appearance: Normal appearance.  HENT:     Head: Normocephalic and atraumatic.  Eyes:     General: No scleral icterus.    Conjunctiva/sclera: Conjunctivae normal.  Cardiovascular:     Rate and Rhythm: Normal rate and  regular rhythm.     Heart sounds: Normal heart sounds.  Pulmonary:     Effort: Pulmonary effort is normal.     Breath sounds: Normal breath sounds.  Chest:     Comments: Port-A-Cath in place without signs of infection Abdominal:     General: There is no distension.  Lymphadenopathy:     Cervical: No cervical adenopathy.  Neurological:     General: No focal deficit present.     Mental Status: He is oriented to person, place, and time.  Psychiatric:        Mood and Affect: Mood normal.        Behavior: Behavior normal.      LABORATORY DATA:   I have reviewed the data as listed.  Results for orders placed or performed in visit on 12/04/23  CBC with Differential (Cancer Center Only)  Result Value Ref Range   WBC Count 5.4 4.0 - 10.5 K/uL   RBC 4.02 (L) 4.22 - 5.81 MIL/uL   Hemoglobin 10.5 (L) 13.0 - 17.0 g/dL   HCT 21.3 (L) 08.6 - 57.8 %   MCV 82.1 80.0 - 100.0 fL   MCH 26.1 26.0 - 34.0 pg   MCHC 31.8 30.0 - 36.0 g/dL   RDW 46.9 (H) 62.9 - 52.8 %   Platelet Count 185 150 - 400 K/uL   nRBC 0.0 0.0 - 0.2 %   Neutrophils Relative % 65 %   Neutro Abs 3.5 1.7 - 7.7 K/uL   Lymphocytes Relative 21 %   Lymphs Abs 1.1 0.7 - 4.0 K/uL   Monocytes Relative 12 %   Monocytes Absolute 0.7 0.1 - 1.0 K/uL   Eosinophils Relative 1 %   Eosinophils Absolute 0.1 0.0 - 0.5 K/uL   Basophils Relative 1 %   Basophils Absolute 0.0 0.0 - 0.1 K/uL   Immature Granulocytes 0 %   Abs Immature Granulocytes 0.02 0.00 - 0.07 K/uL  CMP (Cancer Center only)  Result Value Ref Range   Sodium 137 135 - 145 mmol/L   Potassium 3.8 3.5 - 5.1 mmol/L   Chloride  105 98 - 111 mmol/L   CO2 28 22 - 32 mmol/L   Glucose, Bld 91 70 - 99 mg/dL   BUN 6 6 - 20 mg/dL   Creatinine 9.14 (L) 7.82 - 1.24 mg/dL   Calcium  8.5 (L) 8.9 - 10.3 mg/dL   Total Protein 6.3 (L) 6.5 - 8.1 g/dL   Albumin 3.4 (L) 3.5 - 5.0 g/dL   AST 14 (L) 15 - 41 U/L   ALT 11 0 - 44 U/L   Alkaline Phosphatase 64 38 - 126 U/L   Total Bilirubin  0.4 0.0 - 1.2 mg/dL   GFR, Estimated >95 >62 mL/min   Anion gap 4 (L) 5 - 15       RADIOGRAPHIC STUDIES:  No recent pertinent imaging available to review.  CODE STATUS:  Code Status History     Date Active Date Inactive Code Status Order ID Comments User Context   06/14/2023 1621 07/11/2023 2225 Full Code [id number]  Danice Dural, MD Inpatient   06/13/2023 1253 06/14/2023 0509 Full Code [id number]  Elene Griffes, MD [facility]   05/27/2023 1516 05/30/2023 1651 Full Code [id number]  Gaylin Ke, MD ED    Questions for Most Recent Historical Code Status (Order [id number])     Question Answer   By: Consent: discussion documented in EHR            No orders of the defined types were placed in this encounter.    Future Appointments  Date Time Provider Department Center  12/06/2023 12:15 PM [facility] FLUSH [facility] None  12/18/2023 11:15 AM [facility] FLUSH [facility] None  12/18/2023 11:45 AM Danzell Birky, MD [facility] None  12/18/2023 12:30 PM [facility] INFUSION [facility] None  12/18/2023  1:45 PM Woody Heading, RD [facility] None  12/20/2023  2:15 PM [facility] FLUSH [facility] None     This document was completed utilizing speech recognition software. Grammatical errors, random word insertions, pronoun errors, and incomplete sentences are an occasional consequence of this system due to software limitations, ambient noise, and hardware issues. Any formal questions or concerns about the content, text or information contained within the body of this dictation should be directly addressed to the provider for clarification.

## 2023-12-05 ENCOUNTER — Encounter: Payer: Self-pay | Admitting: Oncology

## 2023-12-05 ENCOUNTER — Other Ambulatory Visit (HOSPITAL_COMMUNITY): Payer: Self-pay

## 2023-12-05 ENCOUNTER — Other Ambulatory Visit: Payer: Self-pay

## 2023-12-06 ENCOUNTER — Inpatient Hospital Stay

## 2023-12-06 DIAGNOSIS — C160 Malignant neoplasm of cardia: Secondary | ICD-10-CM

## 2023-12-06 DIAGNOSIS — Z5111 Encounter for antineoplastic chemotherapy: Secondary | ICD-10-CM | POA: Diagnosis not present

## 2023-12-06 MED ORDER — HEPARIN SOD (PORK) LOCK FLUSH 100 UNIT/ML IV SOLN
500.0000 [IU] | Freq: Once | INTRAVENOUS | Status: AC | PRN
  Administered 2023-12-06: 500 [IU]

## 2023-12-06 MED ORDER — SODIUM CHLORIDE 0.9% FLUSH
10.0000 mL | INTRAVENOUS | Status: DC | PRN
  Administered 2023-12-06: 10 mL

## 2023-12-10 ENCOUNTER — Other Ambulatory Visit (HOSPITAL_COMMUNITY): Payer: Self-pay

## 2023-12-18 ENCOUNTER — Other Ambulatory Visit: Payer: Self-pay

## 2023-12-18 ENCOUNTER — Inpatient Hospital Stay: Admitting: Dietician

## 2023-12-18 ENCOUNTER — Encounter: Payer: Self-pay | Admitting: Oncology

## 2023-12-18 ENCOUNTER — Inpatient Hospital Stay

## 2023-12-18 ENCOUNTER — Inpatient Hospital Stay (HOSPITAL_BASED_OUTPATIENT_CLINIC_OR_DEPARTMENT_OTHER): Admitting: Oncology

## 2023-12-18 VITALS — BP 128/92 | HR 93 | Temp 98.5°F | Resp 18 | Ht 66.0 in | Wt 120.0 lb

## 2023-12-18 DIAGNOSIS — Z95828 Presence of other vascular implants and grafts: Secondary | ICD-10-CM

## 2023-12-18 DIAGNOSIS — Z5111 Encounter for antineoplastic chemotherapy: Secondary | ICD-10-CM | POA: Diagnosis not present

## 2023-12-18 DIAGNOSIS — R627 Adult failure to thrive: Secondary | ICD-10-CM

## 2023-12-18 DIAGNOSIS — C160 Malignant neoplasm of cardia: Secondary | ICD-10-CM

## 2023-12-18 DIAGNOSIS — G893 Neoplasm related pain (acute) (chronic): Secondary | ICD-10-CM | POA: Diagnosis not present

## 2023-12-18 LAB — CMP (CANCER CENTER ONLY)
ALT: 7 U/L (ref 0–44)
AST: 13 U/L — ABNORMAL LOW (ref 15–41)
Albumin: 3.7 g/dL (ref 3.5–5.0)
Alkaline Phosphatase: 70 U/L (ref 38–126)
Anion gap: 5 (ref 5–15)
BUN: 9 mg/dL (ref 6–20)
CO2: 26 mmol/L (ref 22–32)
Calcium: 9.1 mg/dL (ref 8.9–10.3)
Chloride: 105 mmol/L (ref 98–111)
Creatinine: 0.65 mg/dL (ref 0.61–1.24)
GFR, Estimated: >60 mL/min (ref >60)
Glucose, Bld: 180 mg/dL — ABNORMAL HIGH (ref 70–99)
Potassium: 4.3 mmol/L (ref 3.5–5.1)
Sodium: 136 mmol/L (ref 135–145)
Total Bilirubin: 0.4 mg/dL (ref 0.0–1.2)
Total Protein: 6.7 g/dL (ref 6.5–8.1)

## 2023-12-18 LAB — CBC WITH DIFFERENTIAL (CANCER CENTER ONLY)
Abs Immature Granulocytes: 0.01 10*3/uL (ref 0.00–0.07)
Basophils Absolute: 0 10*3/uL (ref 0.0–0.1)
Basophils Relative: 1 %
Eosinophils Absolute: 0.3 10*3/uL (ref 0.0–0.5)
Eosinophils Relative: 6 %
HCT: 35.5 % — ABNORMAL LOW (ref 39.0–52.0)
Hemoglobin: 11.5 g/dL — ABNORMAL LOW (ref 13.0–17.0)
Immature Granulocytes: 0 %
Lymphocytes Relative: 27 %
Lymphs Abs: 1.4 10*3/uL (ref 0.7–4.0)
MCH: 27 pg (ref 26.0–34.0)
MCHC: 32.4 g/dL (ref 30.0–36.0)
MCV: 83.3 fL (ref 80.0–100.0)
Monocytes Absolute: 0.5 10*3/uL (ref 0.1–1.0)
Monocytes Relative: 10 %
Neutro Abs: 3 10*3/uL (ref 1.7–7.7)
Neutrophils Relative %: 56 %
Platelet Count: 209 10*3/uL (ref 150–400)
RBC: 4.26 MIL/uL (ref 4.22–5.81)
RDW: 16.6 % — ABNORMAL HIGH (ref 11.5–15.5)
WBC Count: 5.3 10*3/uL (ref 4.0–10.5)
nRBC: 0 % (ref 0.0–0.2)

## 2023-12-18 MED ORDER — SODIUM CHLORIDE 0.9% FLUSH
10.0000 mL | Freq: Once | INTRAVENOUS | Status: AC
  Administered 2023-12-18: 10 mL

## 2023-12-18 MED ORDER — SODIUM CHLORIDE 0.9% FLUSH
10.0000 mL | INTRAVENOUS | Status: DC | PRN
  Administered 2023-12-18: 10 mL

## 2023-12-18 MED ORDER — HEPARIN SOD (PORK) LOCK FLUSH 100 UNIT/ML IV SOLN
500.0000 [IU] | Freq: Once | INTRAVENOUS | Status: AC | PRN
  Administered 2023-12-18: 500 [IU]

## 2023-12-18 MED ORDER — SODIUM CHLORIDE 0.9 % IV SOLN
240.0000 mg | Freq: Once | INTRAVENOUS | Status: AC
  Administered 2023-12-18: 240 mg via INTRAVENOUS
  Filled 2023-12-18: qty 24

## 2023-12-18 MED ORDER — PALONOSETRON HCL INJECTION 0.25 MG/5ML
0.2500 mg | Freq: Once | INTRAVENOUS | Status: AC
  Administered 2023-12-18: 0.25 mg via INTRAVENOUS
  Filled 2023-12-18: qty 5

## 2023-12-18 MED ORDER — DEXTROSE 5 % IV SOLN: INTRAVENOUS | Status: DC

## 2023-12-18 NOTE — Assessment & Plan Note (Addendum)
-  Please review HPI/oncology history for additional details and timeline of events.  -Reviewed staging PET/CT findings with the patient previously.  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  Discussed treatment options. All treatment options are palliative in nature and not curative intent. Plan made to proceed with palliative systemic treatment using FOLFOX plus nivolumab .   -He started palliative systemic treatments with FOLFOX plus nivolumab  from 07/23/2023.  He was admitted to the hospital on 07/30/2023 after he presented with chest and epigastric pain, episodes of melena. He was found to have hemoglobin of 6.8. He was admitted for blood transfusion and further evaluation.  GI deferred additional workup/EGD since the cause of blood loss seemed obvious from malignancy. He was discharged home on 08/02/2023.   -He resumed systemic treatments with cycle 2 of FOLFOX plus nivolumab  on 08/07/2023 without 5-FU bolus and a 20% dose reduction of oxaliplatin  and tolerated the chemotherapy and immunotherapy reasonably well.  Did not experience major side effects.  Recent lower extremity ultrasound to rule out DVT showed possible inguinal lymphadenopathy.  Hence PET/CT was obtained for further evaluation on 09/10/2023.  It showed significant improvement with decreased size and metabolic some of the gastric mass.  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  Overall PET scan showing encouraging results.  Hence plan made to continue current management with dose reduced FOLFOX and nivolumab  for up to 6 cycles followed by maintenance treatments.  He received cycle 6 of FOLFOX plus nivolumab  on 11/06/2023. Following cycle 6, plan was to transition him to maintenance treatments with 5-FU and nivolumab , to be continued every 2 weeks.  Only if intolerable side effects are noted to chemotherapy, then we will transition him to  maintenance treatment with immunotherapy alone.  2 weeks ago, he received 5-FU plus nivolumab  treatment.  He has been having nausea, decreased appetite and progressive weight loss of 12 pounds in the last 2 weeks.  Overall, he lost almost 50 pounds compared to February 2025.  This degree of weight loss is concerning.  Hence we will skip 5-FU with current cycle and proceed with nivolumab  treatment alone.  Previously we will plan to get restaging PET scan in mid July.  Given progressive weight loss, we will obtain PET scan sooner.  Request placed for it to be done next week.  RTC in 2 weeks for labs, office visit and continuation of treatments.

## 2023-12-18 NOTE — Progress Notes (Signed)
 Nutrition Follow-up:  Patient with metastatic adenocarcinoma of GE junction. He is receiving palliative Folfox + Nivolumab  q14d. Patient is under the care of Dr. Randye Buttner.   1/28-1/31 hospital admit - UGI; acute/chronic blood loss anemia  12/13-1/9 hospital admit - FTT S/p open Jtube 12/17 (DME: [facility]) 4/15 - Jtube fell out - not replaced   Met with patient as he was existing infusion area. Chemo held today to allow additional recovery time per pt. He reports last treatment hit him hard with N/V and fatigue. Pt feeling tired. Reports appetite is improving.    Medications: reviewed   Labs: glucose 180  Anthropometrics: Wt 120 lb today   6/4 - 132 lb  5/21 - 138 lb 9.6 oz  5/7 - 133 lb 4.8 oz   Estimated Energy Needs  Kcals: 2195-2500 Protein: 103-113 Fluid: >/= 2L  NUTRITION DIAGNOSIS: Inadequate oral intake - ongoing    MALNUTRITION DIAGNOSIS: Severe malnutrition - continues    INTERVENTION:  Reviewed foods typically better tolerated when nauseous (cold/cool foods). He does like and tolerates ice cream well Encourage small frequent meals/snacks of nutrient dense foods He does not like ONS    MONITORING, EVALUATION, GOAL: wt trends, intake    NEXT VISIT: To be scheduled with treatment

## 2023-12-18 NOTE — Progress Notes (Signed)
 [facility]  ONCOLOGY CLINIC PROGRESS NOTE   Patient Care Team: Jerrlyn Morel, NP as PCP - General (Pulmonary Disease) Arlo Berber, MD as Consulting Physician (Oncology) Ozell Blunt, MD as Consulting Physician (Gastroenterology) Pickenpack-Cousar, Giles Labrum, NP as Nurse Practitioner [facility] and Palliative Medicine)  PATIENT NAME: Jesus Haynes   MR#: [id number] DOB: 10-11-1975  Date of visit: 12/18/2023   ASSESSMENT & PLAN:   Jesus Haynes is a 48 y.o. pleasant gentleman with history of asthma, presented to the ED on 05/27/2023 with complaints of epigastric burning abdominal pain, nausea, retching.  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  Adenocarcinoma of gastroesophageal junction (HCC) -Please review HPI/oncology history for additional details and timeline of events.  -Reviewed staging PET/CT findings with the patient previously.  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  Discussed treatment options. All treatment options are palliative in nature and not curative intent. Plan made to proceed with palliative systemic treatment using FOLFOX plus nivolumab .   -He started palliative systemic treatments with FOLFOX plus nivolumab  from 07/23/2023.  He was admitted to the hospital on 07/30/2023 after he presented with chest and epigastric pain, episodes of melena. He was found to have hemoglobin of 6.8. He was admitted for blood transfusion and further evaluation.  GI deferred additional workup/EGD since the cause of blood loss seemed obvious from malignancy. He was discharged home on 08/02/2023.   -He resumed systemic treatments with cycle 2 of FOLFOX plus nivolumab  on 08/07/2023 without 5-FU bolus and a 20% dose reduction of  oxaliplatin  and tolerated the chemotherapy and immunotherapy reasonably well.  Did not experience major side effects.  Recent lower extremity ultrasound to rule out DVT showed possible inguinal lymphadenopathy.  Hence PET/CT was obtained for further evaluation on 09/10/2023.  It showed significant improvement with decreased size and metabolic some of the gastric mass.  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  Overall PET scan showing encouraging results.  Hence plan made to continue current management with dose reduced FOLFOX and nivolumab  for up to 6 cycles followed by maintenance treatments.  He received cycle 6 of FOLFOX plus nivolumab  on 11/06/2023. Following cycle 6, plan was to transition him to maintenance treatments with 5-FU and nivolumab , to be continued every 2 weeks.  Only if intolerable side effects are noted to chemotherapy, then we will transition him to maintenance treatment with immunotherapy alone.  2 weeks ago, he received 5-FU plus nivolumab  treatment.  He has been having nausea, decreased appetite and progressive weight loss of 12 pounds in the last 2 weeks.  Overall, he lost almost 50 pounds compared to February 2025.  This degree of weight loss is concerning.  Hence we will skip 5-FU with current cycle and proceed with nivolumab  treatment alone.  Previously we will plan to get restaging PET scan in mid July.  Given progressive weight loss, we will obtain PET scan sooner.  Request placed for it to be done next week.  RTC in 2 weeks for labs, office visit and continuation of treatments.  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 Currently taking methadone  5 mg once daily. No need for additional pain medication such as Dilaudid  at this time. - Continue methadone  5 mg once daily for pain management.  Failure to thrive in adult Significant weight  loss of approximately 50 pounds over the last four months, with current weight at 120 pounds. Nausea and  vomiting primarily occur in the first few days post-chemotherapy, with vomiting occurring about three times. No diarrhea reported. Appetite is reduced, and there is difficulty in consuming food due to stomach discomfort. Concerns about the impact of chemotherapy on weight and nausea, leading to a decision to pause chemotherapy and focus on immunotherapy. Marijuana is being used to help with appetite. - Pause chemotherapy and proceed with immunotherapy (Opdivo ) today. - Consult with nutritionist to focus on adequate calorie and protein intake. - Use Zofran  (Ondansetron ) as needed for nausea, especially in the days following treatment. - Request PET scan to be done before the next visit.   I reviewed lab results and outside records for this visit and discussed relevant results with the patient. Diagnosis, plan of care and treatment options were also discussed in detail with the patient. Opportunity provided to ask questions and answers provided to his apparent satisfaction. Provided instructions to call our clinic with any problems, questions or concerns prior to return visit. I recommended to continue follow-up with PCP and sub-specialists. He verbalized understanding and agreed with the plan.   NCCN guidelines have been consulted in the planning of this patient's care.  I spent a total of 30 minutes during this encounter with the patient including review of chart and various tests results, discussions about plan of care and coordination of care plan.   Arlo Berber, MD  12/18/2023 2:43 PM  [facility] - A DEPT OF 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  After completing 6 cycles of FOLFOX + nivolumab , transitioned to maintenance treatments with 5-FU plus nivolumab  from 11/20/2023.  INTERVAL HISTORY:   Discussed the use of AI scribe software for clinical note transcription with the patient, who gave verbal consent to proceed.  History of Present Illness Jesus Haynes is a 48 year old male undergoing chemotherapy who presents with significant weight loss and nausea.  He has experienced a significant weight loss of approximately 50 pounds over the past four months, with his weight decreasing from 170 pounds in February to 120 pounds currently. This weight loss is associated with a lack of appetite and difficulty eating, particularly after chemotherapy sessions.  He experiences severe nausea and vomiting, especially in the first few days following chemotherapy. He vomited approximately three times after the last chemotherapy session, which has contributed to his reluctance to eat due to fear of vomiting. No diarrhea or loose stools.  For pain management, he is currently taking methadone  5 mg once daily and has not needed to use Dilaudid  recently. He is unsure if he is taking Zofran  for nausea but mentions using marijuana to help maintain his appetite, stating 'that's the only way I can keep anything.'    I have reviewed the past medical history, past surgical history, social history and family history with the patient and they are unchanged from previous note.  HISTORY OF PRESENT ILLNESS:   Oncology History  Adenocarcinoma of gastroesophageal junction (HCC)  05/29/2023 Initial Diagnosis   Adenocarcinoma of gastroesophageal junction (HCC)   07/18/2023 Cancer Staging   Staging  form: Esophagus - Adenocarcinoma, AJCC 8th Edition - Clinical: Stage IVB (cT4, cN3, cM1, G3) - Signed by Arlo Berber, MD on 07/18/2023 Histologic grading system: 3 grade system   07/23/2023 -  Chemotherapy   Patient is on Treatment Plan :  GASTROESOPHAGEAL FOLFOX + Nivolumab  q14d       48 y.o. gentleman with history of asthma, presented to the ED on 05/27/2023 with complaints of epigastric burning abdominal pain, nausea, retching.  He also reported some dark tarry stools for 3 days prior to arrival.  He was previously seen in the ED [facility] on [facility]23 with complaints of chest or right upper quadrant abdominal pain.  Workup was unremarkable at that time and he was sent home with Protonix  and Carafate .  Since his symptoms persisted, he presented to the ED again.   In the ED, his hemoglobin was noted to be down to 10, compared to 13 previously.  With concern for upper GI bleed, was admitted for further evaluation and management.   Dr. Lavaughn Portland performed upper GI endoscopy on 05/28/2023.  It showed partially obstructing, likely malignant esophageal tumor in the lower third of the esophagus.  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  Normal duodenum.  Pathology from GE junction mass came back positive for invasive adenocarcinoma, moderately to poorly differentiated.  Immunostains pending.   CT chest abdomen pelvis on 05/28/2023 showed abnormal wall thickening at the GE junction, compatible with patient's known carcinoma.  Prominent lymph nodes in the retrocrural region on the right side, adjacent to the hiatal hernia.  Abnormal low-density soft tissue in the gastrohepatic region, abutting the lesser curvature and proximal stomach, worrisome for metastatic disease.  Periportal, periceliac, gastrosplenic and left retroperitoneal lymphadenopathy, worrisome for metastatic involvement.  Hypodensities in the liver were also noted, worrisome for metastatic disease, largest 1 measuring 13 mm in the inferior right lobe.  Fat stranding surrounding the body and tail of pancreas, suggesting acute pancreatitis.   We were consulted during his hospitalization on 05/29/2023 for additional recommendations given new diagnosis of GE junction  adenocarcinoma.   On 06/07/2023, staging PET/CT showed large hypermetabolic mass involving the distal esophagus and proximal stomach consistent with known primary esophageal carcinoma.  Multiple hypermetabolic retroperitoneal, lower mediastinal lymph nodes consistent with locally advanced disease.  Suspected direct tumor extension into the pancreatic body.  Hypermetabolic node or peritoneal implant within the omentum.  No other evidence of metastatic disease.   Plan was for palliative systemic treatments with FOLFOX plus nivolumab .  However patient's performance status continued to decline and he had to be hospitalized again on 06/14/2023.  He finally presented to reestablish care in our clinic on 07/18/2023.  His performance status improved with nutrition via J-tube.  Started systemic treatments from 07/23/2023.  We did dose reduce oxaliplatin  by 20% and proceeded with 68 mg/m dose.  When he presented for cycle 2 on 08/07/2023, leukopenia with white count of 3300, ANC 1100.  We started skipping 5-FU bolus and continue with the dose reduced oxaliplatin .  Restaging PET scan on 09/10/2023 showed excellent response with significant reduction in size and metabolism in the gastric mass.  Resolution of peritoneal metastatic disease.   Completed 6 cycles of FOLFOX plus nivolumab  as of 11/06/2023.  Transitioned to maintenance treatments with 5-FU plus nivolumab  from 11/20/2023.  REVIEW OF SYSTEMS:   Review of Systems - Oncology  All other pertinent systems were reviewed with the patient and are negative.  ALLERGIES: He has no known allergies.  MEDICATIONS:  Current  Outpatient Medications  Medication Sig Dispense Refill   albuterol  (PROVENTIL ) (2.5 MG/3ML) 0.083% nebulizer solution Take 3 mLs (2.5 mg total) by nebulization every 6 (six) hours as needed for wheezing or shortness of breath. 75 mL 12   albuterol  (VENTOLIN  HFA) 108 (90 Base) MCG/ACT inhaler Inhale 1-2 puffs into the lungs every 4 (four) hours  as needed for wheezing or shortness of breath. 18 g 3   Albuterol  Sulfate 2.5 MG/0.5ML NEBU Use 1 vial in nebulizer as directed every 6 hours as needed for wheezing or shortness of breath 30 mL 3   cyanocobalamin  1000 MCG tablet Take 1 tablet (1,000 mcg total) by mouth daily. 30 tablet 6   dronabinol  (MARINOL ) 5 MG capsule Take 1 capsule (5 mg total) by mouth 2 (two) times daily before lunch and supper. 60 capsule 1   folic acid  (FOLVITE ) 1 MG tablet Take 1 tablet (1 mg total) by mouth daily. 30 tablet 6   furosemide  (LASIX ) 20 MG tablet Take 1 tablet (20 mg total) by mouth daily as needed for fluid or edema. 30 tablet 3   hydrocortisone  (CORTEF ) 10 MG tablet Take 2 tablets by mouth every morning and 1 tablet every evening for one week, then one tablet twice daily 50 tablet 1   methadone  (DOLOPHINE ) 5 MG tablet Take 1 tablet (5 mg total) by mouth daily. 30 tablet 0   metoCLOPramide  (REGLAN ) 10 MG tablet Take 1 tablet (10 mg total) by mouth 4 (four) times daily  before meals and at bedtime. 120 tablet 3   ondansetron  (ZOFRAN -ODT) 8 MG disintegrating tablet Dissolve 1 tablet (8 mg total) by mouth every 8 (eight) hours as needed for nausea or vomiting. 30 tablet 3   pantoprazole  (PROTONIX ) 40 MG tablet Take 1 tablet (40 mg total) by mouth 2 (two) times daily. 60 tablet 1   potassium chloride  SA (KLOR-CON  M) 20 MEQ tablet Take 1 tablet (20 mEq total) by mouth 2 (two) times daily. 60 tablet 3   prochlorperazine  (COMPAZINE ) 10 MG tablet Take 1 tablet (10 mg total) by mouth every 6 (six) hours as needed for nausea or vomiting. 30 tablet 1   sucralfate  (CARAFATE ) 1 GM/10ML suspension Take 10 mLs (1 g total) by mouth 4 (four) times daily -  with meals and at bedtime. 1200 mL 2   traZODone  (DESYREL ) 100 MG tablet Take 0.5 tablets (50 mg total) by mouth at bedtime. 30 tablet 3   feeding supplement (ENSURE ENLIVE / ENSURE PLUS) LIQD Take as directed (Patient not taking: Reported on 12/18/2023) 948 mL 12    HYDROmorphone  (DILAUDID ) 2 MG tablet Take 1 tablet (2 mg total) by mouth every 6 (six) hours as needed for severe pain (pain score 7-10). (Patient not taking: Reported on 12/18/2023) 60 tablet 0   lidocaine -prilocaine  (EMLA ) cream Apply to affected area once (Patient not taking: Reported on 12/18/2023) 30 g 3   Nutritional Supplements (FEEDING SUPPLEMENT, OSMOLITE 1.5 CAL,) LIQD At 60 mL/h over 18 hours on a daily basis. (Patient not taking: Reported on 12/18/2023) 30000 mL 0   polyethylene glycol powder (GLYCOLAX /MIRALAX ) 17 GM/SCOOP powder Dissolve 17 g in 4 oz of liquid and take by mouth daily. (Patient not taking: Reported on 12/18/2023) 476 g 0   senna-docusate (SENOKOT-S) 8.6-50 MG tablet Take 2 tablets by mouth at bedtime. (Patient not taking: Reported on 12/18/2023) 60 tablet 2   Water  For Irrigation, Sterile (FREE WATER ) SOLN Place 100 mLs into feeding tube 4 (four) times daily. (Patient not  taking: Reported on 12/04/2023) 12000 mL 1   No current facility-administered medications for this visit.   Facility-Administered Medications Ordered in Other Visits  Medication Dose Route Frequency Provider Last Rate Last Admin   dextrose  5 % solution   Intravenous Continuous Judy Pollman, MD   Stopped at 12/18/23 1420   sodium chloride  flush (NS) 0.9 % injection 10 mL  10 mL Intracatheter PRN Anne-Marie Genson, MD   10 mL at 12/18/23 1422     VITALS:   Blood pressure (!) 128/92, pulse 93, temperature 98.5 F (36.9 C), temperature source Temporal, resp. rate 18, height 5' 6 (1.676 m), weight 120 lb (54.4 kg), SpO2 97%.  Wt Readings from Last 3 Encounters:  12/18/23 120 lb (54.4 kg)  12/04/23 132 lb (59.9 kg)  11/20/23 138 lb 9.6 oz (62.9 kg)    Body mass index is 19.37 kg/m.   Onc Performance Status - 12/18/23 1159       ECOG Perf Status   ECOG Perf Status Capable of only limited selfcare, confined to bed or chair more than 50% of waking hours      KPS SCALE   KPS % SCORE Requires occasional  assistance but is able to care for most needs             PHYSICAL EXAM:   Physical Exam Constitutional:      General: He is not in acute distress.    Appearance: Normal appearance.  HENT:     Head: Normocephalic and atraumatic.   Eyes:     General: No scleral icterus.    Conjunctiva/sclera: Conjunctivae normal.    Cardiovascular:     Rate and Rhythm: Normal rate and regular rhythm.     Heart sounds: Normal heart sounds.  Pulmonary:     Effort: Pulmonary effort is normal.     Breath sounds: Normal breath sounds.  Chest:     Comments: Port-A-Cath in place without signs of infection Abdominal:     General: There is no distension.  Lymphadenopathy:     Cervical: No cervical adenopathy.   Neurological:     General: No focal deficit present.     Mental Status: He is oriented to person, place, and time.   Psychiatric:        Mood and Affect: Mood normal.        Behavior: Behavior normal.      LABORATORY DATA:   I have reviewed the data as listed.  Results for orders placed or performed in visit on 12/18/23  CMP (Cancer Center only)  Result Value Ref Range   Sodium 136 135 - 145 mmol/L   Potassium 4.3 3.5 - 5.1 mmol/L   Chloride 105 98 - 111 mmol/L   CO2 26 22 - 32 mmol/L   Glucose, Bld 180 (H) 70 - 99 mg/dL   BUN 9 6 - 20 mg/dL   Creatinine 6.57 8.46 - 1.24 mg/dL   Calcium  9.1 8.9 - 10.3 mg/dL   Total Protein 6.7 6.5 - 8.1 g/dL   Albumin 3.7 3.5 - 5.0 g/dL   AST 13 (L) 15 - 41 U/L   ALT 7 0 - 44 U/L   Alkaline Phosphatase 70 38 - 126 U/L   Total Bilirubin 0.4 0.0 - 1.2 mg/dL   GFR, Estimated >96 >29 mL/min   Anion gap 5 5 - 15  CBC with Differential (Cancer Center Only)  Result Value Ref Range   WBC Count 5.3 4.0 - 10.5 K/uL  RBC 4.26 4.22 - 5.81 MIL/uL   Hemoglobin 11.5 (L) 13.0 - 17.0 g/dL   HCT 16.1 (L) 09.6 - 04.5 %   MCV 83.3 80.0 - 100.0 fL   MCH 27.0 26.0 - 34.0 pg   MCHC 32.4 30.0 - 36.0 g/dL   RDW 40.9 (H) 81.1 - 91.4 %   Platelet  Count 209 150 - 400 K/uL   nRBC 0.0 0.0 - 0.2 %   Neutrophils Relative % 56 %   Neutro Abs 3.0 1.7 - 7.7 K/uL   Lymphocytes Relative 27 %   Lymphs Abs 1.4 0.7 - 4.0 K/uL   Monocytes Relative 10 %   Monocytes Absolute 0.5 0.1 - 1.0 K/uL   Eosinophils Relative 6 %   Eosinophils Absolute 0.3 0.0 - 0.5 K/uL   Basophils Relative 1 %   Basophils Absolute 0.0 0.0 - 0.1 K/uL   Immature Granulocytes 0 %   Abs Immature Granulocytes 0.01 0.00 - 0.07 K/uL     RADIOGRAPHIC STUDIES:  No recent pertinent imaging available to review.  CODE STATUS:  Code Status History     Date Active Date Inactive Code Status Order ID Comments User Context   06/14/2023 1621 07/11/2023 2225 Full Code [id number]  Danice Dural, MD Inpatient   06/13/2023 1253 06/14/2023 0509 Full Code [id number]  Elene Griffes, MD HOV   05/27/2023 1516 05/30/2023 1651 Full Code [id number]  Gaylin Ke, MD ED    Questions for Most Recent Historical Code Status (Order [id number])     Question Answer   By: Consent: discussion documented in EHR            Orders Placed This Encounter  Procedures   NM PET Image Restag (PS) Skull Base To Thigh    Standing Status:   Future    Expected Date:   12/26/2023    Expiration Date:   12/17/2024    If indicated for the ordered procedure, I authorize the administration of a radiopharmaceutical per Radiology protocol:   Yes    Preferred imaging location?:      CBC with Differential (Cancer Center Only)    Standing Status:   Future    Expected Date:   01/01/2024    Expiration Date:   12/31/2024   CMP (Cancer Center only)    Standing Status:   Future    Expected Date:   01/01/2024    Expiration Date:   12/31/2024   CBC with Differential (Cancer Center Only)    Standing Status:   Future    Expected Date:   01/15/2024    Expiration Date:   01/14/2025   CMP (Cancer Center only)    Standing Status:   Future    Expected Date:   01/15/2024    Expiration Date:   01/14/2025   CBC  with Differential (Cancer Center Only)    Standing Status:   Future    Expected Date:   01/29/2024    Expiration Date:   01/28/2025   CMP (Cancer Center only)    Standing Status:   Future    Expected Date:   01/29/2024    Expiration Date:   01/28/2025     This document was completed utilizing speech recognition software. Grammatical errors, random word insertions, pronoun errors, and incomplete sentences are an occasional consequence of this system due to software limitations, ambient noise, and hardware issues. Any formal questions or concerns about the content, text or information contained within the body of this dictation  should be directly addressed to the provider for clarification.

## 2023-12-18 NOTE — Progress Notes (Signed)
 Patient seen by Dr. Gale Jude Pasam today  Vitals are within treatment parameters:Yes   Labs are within treatment parameters: Yes   Treatment plan has been signed: Yes   Per physician team, Patient is ready for treatment. Please note the following modifications:  Patient will only get Immunotherapy today.

## 2023-12-18 NOTE — Assessment & Plan Note (Addendum)
 Pain is well-controlled with methadone . Currently taking methadone  5 mg once daily. No need for additional pain medication such as Dilaudid  at this time. - Continue methadone  5 mg once daily for pain management.

## 2023-12-18 NOTE — Assessment & Plan Note (Signed)
 Significant weight loss of approximately 50 pounds over the last four months, with current weight at 120 pounds. Nausea and vomiting primarily occur in the first few days post-chemotherapy, with vomiting occurring about three times. No diarrhea reported. Appetite is reduced, and there is difficulty in consuming food due to stomach discomfort. Concerns about the impact of chemotherapy on weight and nausea, leading to a decision to pause chemotherapy and focus on immunotherapy. Marijuana is being used to help with appetite. - Pause chemotherapy and proceed with immunotherapy (Opdivo ) today. - Consult with nutritionist to focus on adequate calorie and protein intake. - Use Zofran  (Ondansetron ) as needed for nausea, especially in the days following treatment. - Request PET scan to be done before the next visit.

## 2023-12-20 ENCOUNTER — Encounter

## 2023-12-21 ENCOUNTER — Other Ambulatory Visit (HOSPITAL_COMMUNITY): Payer: Self-pay

## 2023-12-30 ENCOUNTER — Encounter (HOSPITAL_COMMUNITY)
Admission: RE | Admit: 2023-12-30 | Discharge: 2023-12-30 | Disposition: A | Discharge status: Home / Self Care | Source: Ambulatory Visit | Attending: Oncology | Admitting: Oncology

## 2023-12-30 DIAGNOSIS — C160 Malignant neoplasm of cardia: Secondary | ICD-10-CM | POA: Diagnosis present

## 2023-12-30 LAB — GLUCOSE, CAPILLARY: Glucose-Capillary: 110 mg/dL — ABNORMAL HIGH (ref 70–99)

## 2023-12-30 MED ORDER — FLUDEOXYGLUCOSE F - 18 (FDG) INJECTION
5.9030 | Freq: Once | INTRAVENOUS | Status: AC
Start: 2023-12-30 — End: 2023-12-30
  Administered 2023-12-30: 5.903 via INTRAVENOUS

## 2024-01-02 ENCOUNTER — Encounter: Payer: Self-pay | Admitting: Oncology

## 2024-01-02 ENCOUNTER — Encounter: Payer: Self-pay | Admitting: Nurse Practitioner

## 2024-01-02 ENCOUNTER — Inpatient Hospital Stay (HOSPITAL_BASED_OUTPATIENT_CLINIC_OR_DEPARTMENT_OTHER): Admitting: Oncology

## 2024-01-02 ENCOUNTER — Inpatient Hospital Stay: Attending: Internal Medicine

## 2024-01-02 ENCOUNTER — Other Ambulatory Visit (HOSPITAL_COMMUNITY): Payer: Self-pay

## 2024-01-02 ENCOUNTER — Other Ambulatory Visit: Payer: Self-pay | Admitting: Nurse Practitioner

## 2024-01-02 ENCOUNTER — Other Ambulatory Visit: Payer: Self-pay

## 2024-01-02 ENCOUNTER — Inpatient Hospital Stay (HOSPITAL_BASED_OUTPATIENT_CLINIC_OR_DEPARTMENT_OTHER): Admitting: Nurse Practitioner

## 2024-01-02 ENCOUNTER — Inpatient Hospital Stay

## 2024-01-02 ENCOUNTER — Inpatient Hospital Stay: Admitting: Dietician

## 2024-01-02 ENCOUNTER — Other Ambulatory Visit: Payer: Self-pay | Admitting: Oncology

## 2024-01-02 VITALS — BP 97/65 | HR 72 | Temp 98.1°F | Resp 18 | Wt 127.8 lb

## 2024-01-02 VITALS — BP 100/66 | HR 70 | Temp 97.7°F | Resp 16

## 2024-01-02 DIAGNOSIS — D649 Anemia, unspecified: Secondary | ICD-10-CM | POA: Insufficient documentation

## 2024-01-02 DIAGNOSIS — R590 Localized enlarged lymph nodes: Secondary | ICD-10-CM | POA: Diagnosis not present

## 2024-01-02 DIAGNOSIS — C160 Malignant neoplasm of cardia: Secondary | ICD-10-CM

## 2024-01-02 DIAGNOSIS — R627 Adult failure to thrive: Secondary | ICD-10-CM

## 2024-01-02 DIAGNOSIS — G893 Neoplasm related pain (acute) (chronic): Secondary | ICD-10-CM

## 2024-01-02 DIAGNOSIS — Z5111 Encounter for antineoplastic chemotherapy: Secondary | ICD-10-CM | POA: Insufficient documentation

## 2024-01-02 DIAGNOSIS — R630 Anorexia: Secondary | ICD-10-CM | POA: Diagnosis not present

## 2024-01-02 DIAGNOSIS — Z79899 Other long term (current) drug therapy: Secondary | ICD-10-CM | POA: Insufficient documentation

## 2024-01-02 DIAGNOSIS — R600 Localized edema: Secondary | ICD-10-CM | POA: Diagnosis not present

## 2024-01-02 DIAGNOSIS — Z515 Encounter for palliative care: Secondary | ICD-10-CM

## 2024-01-02 DIAGNOSIS — R634 Abnormal weight loss: Secondary | ICD-10-CM | POA: Diagnosis not present

## 2024-01-02 DIAGNOSIS — Z95828 Presence of other vascular implants and grafts: Secondary | ICD-10-CM

## 2024-01-02 LAB — CMP (CANCER CENTER ONLY)
ALT: 8 U/L (ref 0–44)
AST: 12 U/L — ABNORMAL LOW (ref 15–41)
Albumin: 3.3 g/dL — ABNORMAL LOW (ref 3.5–5.0)
Alkaline Phosphatase: 63 U/L (ref 38–126)
Anion gap: 4 — ABNORMAL LOW (ref 5–15)
BUN: 9 mg/dL (ref 6–20)
CO2: 29 mmol/L (ref 22–32)
Calcium: 8.7 mg/dL — ABNORMAL LOW (ref 8.9–10.3)
Chloride: 104 mmol/L (ref 98–111)
Creatinine: 0.69 mg/dL (ref 0.61–1.24)
GFR, Estimated: >60 mL/min (ref >60)
Glucose, Bld: 137 mg/dL — ABNORMAL HIGH (ref 70–99)
Potassium: 4 mmol/L (ref 3.5–5.1)
Sodium: 137 mmol/L (ref 135–145)
Total Bilirubin: 0.3 mg/dL (ref 0.0–1.2)
Total Protein: 5.9 g/dL — ABNORMAL LOW (ref 6.5–8.1)

## 2024-01-02 LAB — CBC WITH DIFFERENTIAL (CANCER CENTER ONLY)
Abs Immature Granulocytes: 0.01 10*3/uL (ref 0.00–0.07)
Basophils Absolute: 0 10*3/uL (ref 0.0–0.1)
Basophils Relative: 1 %
Eosinophils Absolute: 0.1 10*3/uL (ref 0.0–0.5)
Eosinophils Relative: 3 %
HCT: 32.8 % — ABNORMAL LOW (ref 39.0–52.0)
Hemoglobin: 10.7 g/dL — ABNORMAL LOW (ref 13.0–17.0)
Immature Granulocytes: 0 %
Lymphocytes Relative: 16 %
Lymphs Abs: 0.8 10*3/uL (ref 0.7–4.0)
MCH: 27.4 pg (ref 26.0–34.0)
MCHC: 32.6 g/dL (ref 30.0–36.0)
MCV: 83.9 fL (ref 80.0–100.0)
Monocytes Absolute: 0.6 10*3/uL (ref 0.1–1.0)
Monocytes Relative: 12 %
Neutro Abs: 3.2 10*3/uL (ref 1.7–7.7)
Neutrophils Relative %: 68 %
Platelet Count: 163 10*3/uL (ref 150–400)
RBC: 3.91 MIL/uL — ABNORMAL LOW (ref 4.22–5.81)
RDW: 15.3 % (ref 11.5–15.5)
WBC Count: 4.7 10*3/uL (ref 4.0–10.5)
nRBC: 0 % (ref 0.0–0.2)

## 2024-01-02 MED ORDER — SODIUM CHLORIDE 0.9% FLUSH
10.0000 mL | INTRAVENOUS | Status: DC | PRN
  Administered 2024-01-02: 10 mL

## 2024-01-02 MED ORDER — PROCHLORPERAZINE MALEATE 10 MG PO TABS
10.0000 mg | ORAL_TABLET | Freq: Four times a day (QID) | ORAL | 1 refills | Status: DC | PRN
  Filled 2024-01-02: qty 30, 7d supply, fill #0
  Filled 2024-02-03: qty 30, 7d supply, fill #1

## 2024-01-02 MED ORDER — METHADONE HCL 5 MG PO TABS
5.0000 mg | ORAL_TABLET | Freq: Every day | ORAL | 0 refills | Status: DC
  Filled 2024-01-02: qty 30, 30d supply, fill #0

## 2024-01-02 MED ORDER — SODIUM CHLORIDE 0.9% FLUSH
10.0000 mL | Freq: Once | INTRAVENOUS | Status: AC
  Administered 2024-01-02: 10 mL

## 2024-01-02 MED ORDER — PANTOPRAZOLE SODIUM 40 MG PO TBEC
40.0000 mg | DELAYED_RELEASE_TABLET | Freq: Two times a day (BID) | ORAL | 1 refills | Status: DC
  Filled 2024-01-02: qty 60, 30d supply, fill #0
  Filled 2024-02-03: qty 60, 30d supply, fill #1

## 2024-01-02 MED ORDER — PALONOSETRON HCL INJECTION 0.25 MG/5ML: 0.2500 mg | Freq: Once | INTRAVENOUS | Status: DC

## 2024-01-02 MED ORDER — SODIUM CHLORIDE 0.9 % IV SOLN: INTRAVENOUS | Status: DC

## 2024-01-02 MED ORDER — SODIUM CHLORIDE 0.9 % IV SOLN
240.0000 mg | Freq: Once | INTRAVENOUS | Status: AC
  Administered 2024-01-02: 240 mg via INTRAVENOUS
  Filled 2024-01-02: qty 24

## 2024-01-02 MED ORDER — HEPARIN SOD (PORK) LOCK FLUSH 100 UNIT/ML IV SOLN
500.0000 [IU] | Freq: Once | INTRAVENOUS | Status: AC | PRN
  Administered 2024-01-02: 500 [IU]

## 2024-01-02 NOTE — Progress Notes (Signed)
 DISCONTINUE ON PATHWAY REGIMEN - Gastroesophageal     A cycle is every 14 days:     Nivolumab       Oxaliplatin       Leucovorin       Fluorouracil       Fluorouracil    **Always confirm dose/schedule in your pharmacy ordering system**  PRIOR TREATMENT: GEOS36: Nivolumab  240 mg + mFOLFOX6 q14 Days Until Progression, Unacceptable Toxicity, or up to 24 Months  START ON PATHWAY REGIMEN - Gastroesophageal     A cycle is every 28 days:     Ramucirumab      Paclitaxel   **Always confirm dose/schedule in your pharmacy ordering system**  Patient Characteristics: Distant Metastases (cM1/pM1) / Locally Recurrent Disease, Adenocarcinoma - Esophageal, GE Junction, and Gastric, Second Line, MSS/pMMR or MSI Unknown Therapeutic Status: Distant Metastases (No Additional Staging) Histology: Adenocarcinoma Disease Classification: GE [employer] of Therapy: Second Restaurant manager, fast food Status: MSS/pMMR Intent of Therapy: Non-Curative / Palliative Intent, Discussed with Patient

## 2024-01-02 NOTE — Assessment & Plan Note (Signed)
 Pain is well-controlled with methadone . Currently taking methadone  5 mg once daily. No need for additional pain medication such as Dilaudid  at this time. - Continue methadone  5 mg once daily for pain management.

## 2024-01-02 NOTE — Patient Instructions (Signed)
 [facility] - A DEPT OF [facility]  Discharge Instructions: Thank you for choosing [facility] to provide your oncology and hematology care.   If you have a lab appointment with the Cancer Center, please go directly to the Cancer Center and check in at the registration area.   Wear comfortable clothing and clothing appropriate for easy access to any Portacath or PICC line.   We strive to give you quality time with your provider. You may need to reschedule your appointment if you arrive late (15 or more minutes).  Arriving late affects you and other patients whose appointments are after yours.  Also, if you miss three or more appointments without notifying the office, you may be dismissed from the clinic at the provider's discretion.      For prescription refill requests, have your pharmacy contact our office and allow 72 hours for refills to be completed.    Today you received the following chemotherapy and/or immunotherapy agent: Nivolumab (Opdivo)   To help prevent nausea and vomiting after your treatment, we encourage you to take your nausea medication as directed.  BELOW ARE SYMPTOMS THAT SHOULD BE REPORTED IMMEDIATELY: *FEVER GREATER THAN 100.4 F (38 C) OR HIGHER *CHILLS OR SWEATING *NAUSEA AND VOMITING THAT IS NOT CONTROLLED WITH YOUR NAUSEA MEDICATION *UNUSUAL SHORTNESS OF BREATH *UNUSUAL BRUISING OR BLEEDING *URINARY PROBLEMS (pain or burning when urinating, or frequent urination) *BOWEL PROBLEMS (unusual diarrhea, constipation, pain near the anus) TENDERNESS IN MOUTH AND THROAT WITH OR WITHOUT PRESENCE OF ULCERS (sore throat, sores in mouth, or a toothache) UNUSUAL RASH, SWELLING OR PAIN  UNUSUAL VAGINAL DISCHARGE OR ITCHING   Items with * indicate a potential emergency and should be followed up as soon as possible or go to the Emergency Department if any problems should occur.  Please show the CHEMOTHERAPY ALERT CARD or  IMMUNOTHERAPY ALERT CARD at check-in to the Emergency Department and triage nurse.  Should you have questions after your visit or need to cancel or reschedule your appointment, please contact [facility] - A DEPT OF [facility]  Dept: [phone]  and follow the prompts.  Office hours are 8:00 a.m. to 4:30 p.m. Monday - Friday. Please note that voicemails left after 4:00 p.m. may not be returned until the following business day.  We are closed weekends and major holidays. You have access to a nurse at all times for urgent questions. Please call the main number to the clinic Dept: [phone] and follow the prompts.   For any non-urgent questions, you may also contact your provider using MyChart. We now offer e-Visits for anyone 40 and older to request care online for non-urgent symptoms. For details visit [email or web address].   Also download the MyChart app! Go to the app store, search "MyChart", open the app, select [place], and log in with your MyChart username and password.  Nivolumab Injection What is this medication? NIVOLUMAB (nye VOL ue mab) treats some types of cancer. It works by helping your immune system slow or stop the spread of cancer cells. It is a monoclonal antibody. This medicine may be used for other purposes; ask your health care provider or pharmacist if you have questions. COMMON BRAND NAME(S): Opdivo What should I tell my care team before I take this medication? They need to know if you have any of these conditions: Allogeneic stem cell transplant (uses someone else's stem cells) Autoimmune diseases, such  as Crohn disease, ulcerative colitis, lupus History of chest radiation Nervous system problems, such as Guillain-Barre syndrome or myasthenia gravis Organ transplant An unusual or allergic reaction to nivolumab, other medications, foods, dyes, or preservatives Pregnant or trying to get pregnant Breast-feeding How should I use  this medication? This medication is infused into a vein. It is given in a hospital or clinic setting. A special MedGuide will be given to you before each treatment. Be sure to read this information carefully each time. Talk to your care team about the use of this medication in children. While it may be prescribed for children as young as 12 years for selected conditions, precautions do apply. Overdosage: If you think you have taken too much of this medicine contact a poison control center or emergency room at once. NOTE: This medicine is only for you. Do not share this medicine with others. What if I miss a dose? Keep appointments for follow-up doses. It is important not to miss your dose. Call your care team if you are unable to keep an appointment. What may interact with this medication? Interactions have not been studied. This list may not describe all possible interactions. Give your health care provider a list of all the medicines, herbs, non-prescription drugs, or dietary supplements you use. Also tell them if you smoke, drink alcohol, or use illegal drugs. Some items may interact with your medicine. What should I watch for while using this medication? Your condition will be monitored carefully while you are receiving this medication. You may need blood work while taking this medication. This medication may cause serious skin reactions. They can happen weeks to months after starting the medication. Contact your care team right away if you notice fevers or flu-like symptoms with a rash. The rash may be red or purple and then turn into blisters or peeling of the skin. You may also notice a red rash with swelling of the face, lips, or lymph nodes in your neck or under your arms. Tell your care team right away if you have any change in your eyesight. Talk to your care team if you are pregnant or think you might be pregnant. A negative pregnancy test is required before starting this medication. A  reliable form of contraception is recommended while taking this medication and for 5 months after the last dose. Talk to your care team about effective forms of contraception. Do not breast-feed while taking this medication and for 5 months after the last dose. What side effects may I notice from receiving this medication? Side effects that you should report to your care team as soon as possible: Allergic reactions--skin rash, itching, hives, swelling of the face, lips, tongue, or throat Dry cough, shortness of breath or trouble breathing Eye pain, redness, irritation, or discharge with blurry or decreased vision Heart muscle inflammation--unusual weakness or fatigue, shortness of breath, chest pain, fast or irregular heartbeat, dizziness, swelling of the ankles, feet, or hands Hormone gland problems--headache, sensitivity to light, unusual weakness or fatigue, dizziness, fast or irregular heartbeat, increased sensitivity to cold or heat, excessive sweating, constipation, hair loss, increased thirst or amount of urine, tremors or shaking, irritability Infusion reactions--chest pain, shortness of breath or trouble breathing, feeling faint or lightheaded Kidney injury (glomerulonephritis)--decrease in the amount of urine, red or dark brown urine, foamy or bubbly urine, swelling of the ankles, hands, or feet Liver injury--right upper belly pain, loss of appetite, nausea, light-colored stool, dark yellow or brown urine, yellowing skin or eyes, unusual  weakness or fatigue Pain, tingling, or numbness in the hands or feet, muscle weakness, change in vision, confusion or trouble speaking, loss of balance or coordination, trouble walking, seizures Rash, fever, and swollen lymph nodes Redness, blistering, peeling, or loosening of the skin, including inside the mouth Sudden or severe stomach pain, bloody diarrhea, fever, nausea, vomiting Side effects that usually do not require medical attention (report these  to your care team if they continue or are bothersome): Bone, joint, or muscle pain Diarrhea Fatigue Loss of appetite Nausea Skin rash This list may not describe all possible side effects. Call your doctor for medical advice about side effects. You may report side effects to FDA at 1-800-FDA-1088. Where should I keep my medication? This medication is given in a hospital or clinic. It will not be stored at home. NOTE: This sheet is a summary. It may not cover all possible information. If you have questions about this medicine, talk to your doctor, pharmacist, or health care provider.  2024 Elsevier/Gold Standard (2021-10-16 00:00:00)

## 2024-01-02 NOTE — Assessment & Plan Note (Addendum)
-  Please review HPI/oncology history for additional details and timeline of events.  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  Discussed treatment options. All treatment options are palliative in nature and not curative intent. Plan made to proceed with palliative systemic treatment using FOLFOX plus nivolumab .   -He started palliative systemic treatments with FOLFOX plus nivolumab  from 07/23/2023.  He was admitted to the hospital on 07/30/2023 after he presented with chest and epigastric pain, episodes of melena. He was found to have hemoglobin of 6.8. He was admitted for blood transfusion and further evaluation.  GI deferred additional workup/EGD since the cause of blood loss seemed obvious from malignancy. He was discharged home on 08/02/2023.   -He resumed systemic treatments with cycle 2 of FOLFOX plus nivolumab  on 08/07/2023 without 5-FU bolus and a 20% dose reduction of oxaliplatin  and tolerated the chemotherapy and immunotherapy reasonably well.  Did not experience major side effects.  Recent lower extremity ultrasound to rule out DVT showed possible inguinal lymphadenopathy.  Hence PET/CT was obtained for further evaluation on 09/10/2023.  It showed significant improvement with decreased size and metabolic some of the gastric mass.  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 Hence plan made to continue current management with dose reduced FOLFOX and nivolumab  for up to 6 cycles followed by maintenance treatments.  He received cycle 6 of FOLFOX plus nivolumab  on 11/06/2023. Following cycle 6, plan was to transition him to maintenance treatments with 5-FU and nivolumab , to be continued every 2 weeks.  However because of fatigue, progressive weight loss, we could only use nivolumab  treatments.  Because of progressive weight loss, we obtained restaging scan sooner.  On 12/30/2023, restaging PET scan showed  evidence of mild disease progression with uptake in size and number of pathologic lymph nodes in the bilateral lung hilum, mediastinum and retroperitoneum.  Subtle peritoneal focus of carcinomatosis was noted.  Groundglass hazy opacity in the lungs in the right lower lobe and left upper lobe with some low-level uptake.  Since patient could not tolerate FOLFOX plus nivolumab  because of side effects, plan made to change regimen to ramucirumab plus paclitaxel on days 1, 8, 15 of 28-day cycle.  In the future, we could reconsider FOLFOX as needed.  Proceed with nivolumab  alone today.  Once we obtain prior authorization for ramucirumab plus paclitaxel, we will begin treatments with the new regimen from 01/15/2024.  Discussed side effect profile including risk of thrombosis, wound healing issues, bleeding issues, hypertension, neuropathy, nausea, vomiting, fatigue, altered taste, infusion reactions etc.  Patient verbalized understanding and is willing to proceed with this regimen.  RTC in 2 weeks for labs, office visit and initiation of ramucirumab plus paclitaxel.

## 2024-01-02 NOTE — Progress Notes (Signed)
 Palliative Medicine [facility]  Telephone:([phone]) [phone] [phone]) [phone]   Name: Jesus Haynes Date: 01/02/2024 MRN: [id number]  DOB: [birth year]  Patient Care Team: [name], NP as PCP - General (Pulmonary Disease) Autumn Millman, MD as Consulting Physician (Oncology) Rosalie Kitchens, MD as Consulting Physician (Gastroenterology) Pickenpack-Cousar, Fannie SAILOR, NP as Nurse Practitioner (Hospice and Palliative Medicine)    INTERVAL HISTORY: Jesus Haynes is a 48 y.o. 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 Palliative ask to see for symptom management and goals of care.   SOCIAL HISTORY:     reports that he quit smoking about 11 years ago. His smoking use included cigarettes. He has never used smokeless tobacco. He reports that he does not drink alcohol and does not use drugs.  ADVANCE DIRECTIVES:  None on file   CODE STATUS: Full code  PAST MEDICAL HISTORY: Past Medical History:  Diagnosis Date   Asthma     ALLERGIES:  has no known allergies.  MEDICATIONS:  Current Outpatient Medications  Medication Sig Dispense Refill   albuterol  (PROVENTIL ) (2.5 MG/3ML) 0.083% nebulizer solution Take 3 mLs (2.5 mg total) by nebulization every 6 (six) hours as needed for wheezing or shortness of breath. 75 mL 12   albuterol  (VENTOLIN  HFA) 108 (90 Base) MCG/ACT inhaler Inhale 1-2 puffs into the lungs every 4 (four) hours as needed for wheezing or shortness of breath. 18 g 3   Albuterol  Sulfate 2.5 MG/0.5ML NEBU Use 1 vial in nebulizer as directed every 6 hours as needed for wheezing or shortness of breath 30 mL 3   cyanocobalamin  1000 MCG tablet Take 1 tablet (1,000 mcg total) by mouth daily. 30 tablet 6   dronabinol  (MARINOL ) 5 MG capsule Take 1 capsule (5 mg total) by mouth 2 (two) times daily before lunch and supper. 60 capsule 1    feeding supplement (ENSURE ENLIVE / ENSURE PLUS) LIQD Take as directed (Patient not taking: Reported on 01/02/2024) 948 mL 12   folic acid  (FOLVITE ) 1 MG tablet Take 1 tablet (1 mg total) by mouth daily. 30 tablet 6   furosemide  (LASIX ) 20 MG tablet Take 1 tablet (20 mg total) by mouth daily as needed for fluid or edema. 30 tablet 3   hydrocortisone  (CORTEF ) 10 MG tablet Take 2 tablets by mouth every morning and 1 tablet every evening for one week, then one tablet twice daily 50 tablet 1   HYDROmorphone  (DILAUDID ) 2 MG tablet Take 1 tablet (2 mg total) by mouth every 6 (six) hours as needed for severe pain (pain score 7-10). (Patient not taking: Reported on 01/02/2024) 60 tablet 0   lidocaine -prilocaine  (EMLA ) cream Apply to affected area once (Patient not taking: Reported on 01/02/2024) 30 g 3   methadone  (DOLOPHINE ) 5 MG tablet Take 1 tablet (5 mg total) by mouth daily. 30 tablet 0   metoCLOPramide  (REGLAN ) 10 MG tablet Take 1 tablet (10 mg total) by mouth 4 (four) times daily  before meals and at bedtime. 120 tablet 3   Nutritional Supplements (FEEDING SUPPLEMENT, OSMOLITE 1.5 CAL,) LIQD At 60 mL/h over 18 hours on a daily basis. (Patient not taking: Reported on 01/02/2024) 30000 mL 0   ondansetron  (ZOFRAN -ODT) 8 MG disintegrating tablet Dissolve 1 tablet (8 mg total) by mouth every 8 (eight) hours as needed for nausea or vomiting. 30 tablet 3   pantoprazole  (PROTONIX ) 40 MG tablet Take  1 tablet (40 mg total) by mouth 2 (two) times daily. 60 tablet 1   polyethylene glycol powder (GLYCOLAX /MIRALAX ) 17 GM/SCOOP powder Dissolve 17 g in 4 oz of liquid and take by mouth daily. (Patient not taking: Reported on 01/02/2024) 476 g 0   potassium chloride  SA (KLOR-CON  M) 20 MEQ tablet Take 1 tablet (20 mEq total) by mouth 2 (two) times daily. 60 tablet 3   prochlorperazine  (COMPAZINE ) 10 MG tablet Take 1 tablet (10 mg total) by mouth every 6 (six) hours as needed for nausea or vomiting. 30 tablet 1   senna-docusate  (SENOKOT-S) 8.6-50 MG tablet Take 2 tablets by mouth at bedtime. (Patient not taking: Reported on 01/02/2024) 60 tablet 2   sucralfate  (CARAFATE ) 1 GM/10ML suspension Take 10 mLs (1 g total) by mouth 4 (four) times daily -  with meals and at bedtime. 1200 mL 2   traZODone  (DESYREL ) 100 MG tablet Take 0.5 tablets (50 mg total) by mouth at bedtime. 30 tablet 3   Water  For Irrigation, Sterile (FREE WATER ) SOLN Place 100 mLs into feeding tube 4 (four) times daily. (Patient not taking: Reported on 01/02/2024) 12000 mL 1   No current facility-administered medications for this visit.    VITAL SIGNS: There were no vitals taken for this visit. There were no vitals filed for this visit.  Estimated body mass index is 20.63 kg/m as calculated from the following:   Height as of 12/18/23: 5' 6 (1.676 m).   Weight as of an earlier encounter on 01/02/24: 127 lb 12.8 oz (58 kg).  PERFORMANCE STATUS (ECOG) : 1 - Symptomatic but completely ambulatory  Physical Exam General: NAD Cardiovascular: regular rate and rhythm Pulmonary: normal breathing pattern Extremities: no edema, no joint deformities Skin: no rashes Neurological: AAO x3  IMPRESSION: Discussed the use of AI scribe software for clinical note transcription with the patient, who gave verbal consent to proceed. History of Present Illness A 48 year old male with cancer who was seen during his infusion for symptom management follow-up. He is doing well overall. No family present.   His energy levels have been stable, and he has managed to gain some weight recently. No issues with constipation or diarrhea. Sleep has been challenging, but trazodone  100 mg has been effective in improving sleep quality. Appetite is minimal at times. Some days better than others. Weight down to 127lbs  He experienced nausea a couple of weeks ago following his last chemotherapy session, but currently has no nausea or vomiting. His treatment regimen was recently changed after  a PET scan revealed additional spots of concern, with the new treatment scheduled to start at his next visit.  He experiences ongoing pain at the site of his J-tube, describing it as 'very rough' with increased tension, especially when he tightens up. Feels more of spasm in nature. Pain management is ongoing, and he has requested a refill for his medication.  Jesus Haynes reports his pain is well-managed on current regimen which includes methadone  5 mg once daily.  We have been able to significantly wean down his pain medication use.  No longer taking hydromorphone .  No adjustments to current regimen at this time.  We will continue to closely monitor and support.  I discussed the importance of continued conversation with family and their medical providers regarding overall plan of care and treatment options, ensuring decisions are within the context of the patients values and GOCs. Assessment & Plan Cancer with metastasis Recent PET scan shows additional concerning spots. Chemotherapy  regimen was changed due to the severity of the last tumor. Awaiting initiation of new treatment regimen after the next visit. - Coordinate with oncology for new chemotherapy regimen. -Patient is still remaining hopeful for stability taking things one day at a time.   Pain related to J-tube Pain at the J-tube site is well-controlled with medication. - Continue current pain management regimen.  Insomnia Insomnia persists, but trazodone  100 mg is helping to some extent. Sleep quality is manageable with current medication. - Continue trazodone  100 mg for insomnia.  I will plan to see patient back in 4-6 weeks.  Sooner if needed.  Patient expressed understanding and was in agreement with this plan. He also understands that He can call the clinic at any time with any questions, concerns, or complaints.   Any controlled substances utilized were prescribed in the context of palliative care. PDMP has been reviewed.   Visit  consisted of counseling and education dealing with the complex and emotionally intense issues of symptom management and palliative care in the setting of serious and potentially life-threatening illness.  Levon Borer, AGPCNP-BC  Palliative Medicine Team/[facility]

## 2024-01-02 NOTE — Progress Notes (Signed)
 [facility]  ONCOLOGY CLINIC PROGRESS NOTE   Patient Care Team: [name], NP as PCP - General (Pulmonary Disease) Autumn Millman, MD as Consulting Physician (Oncology) Rosalie Kitchens, MD as Consulting Physician (Gastroenterology) Pickenpack-Cousar, Fannie SAILOR, NP as Nurse Practitioner [facility] and Palliative Medicine)  PATIENT NAME: Jesus Haynes   MR#: [id number] DOB: 1975-08-13  Date of visit: 01/02/2024   ASSESSMENT & PLAN:   Jesus Haynes is a 48 y.o. pleasant gentleman with history of asthma, presented to the ED on 05/27/2023 with complaints of epigastric burning abdominal pain, nausea, retching.  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  HER2/neu negative.  MMR intact.  NGS testing showed no actionable mutations.  Adenocarcinoma of gastroesophageal junction (HCC) -Please review HPI/oncology history for additional details and timeline of events.  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  Discussed treatment options. All treatment options are palliative in nature and not curative intent. Plan made to proceed with palliative systemic treatment using FOLFOX plus nivolumab .   -He started palliative systemic treatments with FOLFOX plus nivolumab  from 07/23/2023.  He was admitted to the hospital on 07/30/2023 after he presented with chest and epigastric pain, episodes of melena. He was found to have hemoglobin of 6.8. He was admitted for blood transfusion and further evaluation.  GI deferred additional workup/EGD since the cause of blood loss seemed obvious from malignancy. He was discharged home on 08/02/2023.   -He resumed systemic treatments with cycle 2 of FOLFOX plus nivolumab  on 08/07/2023 without 5-FU bolus and a 20% dose  reduction of oxaliplatin  and tolerated the chemotherapy and immunotherapy reasonably well.  Did not experience major side effects.  Recent lower extremity ultrasound to rule out DVT showed possible inguinal lymphadenopathy.  Hence PET/CT was obtained for further evaluation on 09/10/2023.  It showed significant improvement with decreased size and metabolic some of the gastric mass.  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 Hence plan made to continue current management with dose reduced FOLFOX and nivolumab  for up to 6 cycles followed by maintenance treatments.  He received cycle 6 of FOLFOX plus nivolumab  on 11/06/2023. Following cycle 6, plan was to transition him to maintenance treatments with 5-FU and nivolumab , to be continued every 2 weeks.  However because of fatigue, progressive weight loss, we could only use nivolumab  treatments.  Because of progressive weight loss, we obtained restaging scan sooner.  On 12/30/2023, restaging PET scan showed evidence of mild disease progression with uptake in size and number of pathologic lymph nodes in the bilateral lung hilum, mediastinum and retroperitoneum.  Subtle peritoneal focus of carcinomatosis was noted.  Groundglass hazy opacity in the lungs in the right lower lobe and left upper lobe with some low-level uptake.  Since patient could not tolerate FOLFOX plus nivolumab  because of side effects, plan made to change regimen to ramucirumab plus paclitaxel on days 1, 8, 15 of 28-day cycle.  In the future, we could reconsider FOLFOX as needed.  Proceed with nivolumab  alone today.  Once we obtain prior authorization for ramucirumab plus paclitaxel, we will begin treatments with the new regimen from 01/15/2024.  Discussed side effect profile including risk of thrombosis, wound healing issues, bleeding issues, hypertension, neuropathy, nausea, vomiting, fatigue, altered taste, infusion reactions etc.  Patient verbalized understanding and is willing to  proceed with this regimen.  RTC in 2 weeks for labs, office visit and initiation of ramucirumab plus paclitaxel.  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 Currently taking methadone  5 mg once daily. No need for additional pain medication such as Dilaudid  at this time. - Continue methadone  5 mg once daily for pain management.  Failure to thrive in adult Significant weight loss of approximately 50 pounds over the last four months, with current weight at 127 pounds.   He has gained 7 pounds in the last 2 weeks which is encouraging.  Treatment plan is being changed as mentioned above.  - Consult with nutritionist to focus on adequate calorie and protein intake. - Use Zofran  (Ondansetron ) as needed for nausea, especially in the days following treatment.  Nausea and vomiting Intermittent nausea and vomiting, particularly when consuming large meals. Nausea is a persistent issue, but not exacerbated by current treatment. - Continue current antiemetic regimen with Zofran  or Compazine  as needed.   I reviewed lab results and outside records for this visit and discussed relevant results with the patient. Diagnosis, plan of care and treatment options were also discussed in detail with the patient. Opportunity provided to ask questions and answers provided to his apparent satisfaction. Provided instructions to call our clinic with any problems, questions or concerns prior to return visit. I recommended to continue follow-up with PCP and sub-specialists. He verbalized understanding and agreed with the plan.   NCCN guidelines have been consulted in the planning of this patient's care.  I spent a total of 30 minutes during this encounter with the patient including review of chart and various tests results, discussions about plan of care and coordination of care plan.   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  NGS testing showed no actionable mutations.  Current Treatment: Palliative systemic treatments with FOLFOX plus nivolumab , started from 07/23/2023.  After completing 6 cycles of FOLFOX + nivolumab , transitioned to maintenance treatments with 5-FU plus nivolumab  from 11/20/2023.  INTERVAL HISTORY:   Discussed the use of AI scribe software for clinical note transcription with the patient, who gave verbal consent to proceed.  History of Present Illness OSMIN WELZ is a 48 year old male with adenocarcinoma who presents with weight loss and disease progression.  He has experienced significant weight loss, although there has been a recent gain of seven pounds. He experiences occasional nausea and vomiting, particularly when he eats too much, and uses Zofran  or Compazine  as needed for nausea.  Recent PET scans indicate disease progression with increased brightness in lymph nodes in the chest and new spots in the lung. Previously, the main areas involved were the stomach and pancreas, with peritoneal disease still present. He has undergone chemotherapy and immunotherapy, with chemotherapy initially effective but discontinued due to side effects. Immunotherapy has been the recent treatment, but disease progression has been noted.  He has a history of asthma and frequently uses inhalers. There is a concern about potential overuse of inhalers. His hemoglobin A1c is stable at 5.7, and recent blood work, including kidney and liver function tests, are within normal limits. No blood in stools or black stools.  I have reviewed the past medical history, past surgical history, social history and family history with the patient and they are unchanged from  previous note.  HISTORY OF PRESENT ILLNESS:   Oncology History  Adenocarcinoma of gastroesophageal junction (HCC)  05/29/2023 Initial Diagnosis   Adenocarcinoma of gastroesophageal junction (HCC)   07/18/2023 Cancer Staging   Staging form: Esophagus - Adenocarcinoma, AJCC 8th Edition - Clinical: Stage IVB (cT4, cN3, cM1, G3) - Signed by Autumn Millman, MD on 07/18/2023 Histologic grading system: 3 grade system   07/23/2023 -  Chemotherapy   Patient is on Treatment Plan : GASTROESOPHAGEAL FOLFOX + Nivolumab  q14d     01/17/2024 -  Chemotherapy   Patient is on Treatment Plan : GASTROESOPHAGEAL Ramucirumab D1, 15 + Paclitaxel D1,8,15 q28d       48 y.o. gentleman with history of asthma, presented to the ED on 05/27/2023 with complaints of epigastric burning abdominal pain, nausea, retching.  He also reported some dark tarry stools for 3 days prior to arrival.  He was previously seen in the ED at [facility] on 05/08/2023 with complaints of chest or right upper quadrant abdominal pain.  Workup was unremarkable at that time and he was sent home with Protonix  and Carafate .  Since his symptoms persisted, he presented to the ED again.   In the ED, his hemoglobin was noted to be down to 10, compared to 13 previously.  With concern for upper GI bleed, was admitted for further evaluation and management.   Dr. Magod performed upper GI endoscopy on 05/28/2023.  It showed partially obstructing, likely malignant esophageal tumor in the lower third of the esophagus.  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  Normal duodenum.  Pathology from GE junction mass came back positive for invasive adenocarcinoma, moderately to poorly differentiated.  MMR intact.  HER2/neu negative.  Guardant 360 NGS testing showed TP53 [id number] mutation.  No evidence of MSI-high status.  Low TMB (4.75 mut/Mb).  Overall no actionable mutations.   CT chest abdomen pelvis on 05/28/2023 showed abnormal wall thickening at the GE junction,  compatible with patient's known carcinoma.  Prominent lymph nodes in the retrocrural region on the right side, adjacent to the hiatal hernia.  Abnormal low-density soft tissue in the gastrohepatic region, abutting the lesser curvature and proximal stomach, worrisome for metastatic disease.  Periportal, periceliac, gastrosplenic and left retroperitoneal lymphadenopathy, worrisome for metastatic involvement.  Hypodensities in the liver were also noted, worrisome for metastatic disease, largest 1 measuring 13 mm in the inferior right lobe.  Fat stranding surrounding the body and tail of pancreas, suggesting acute pancreatitis.   We were consulted during his hospitalization on 05/29/2023 for additional recommendations given new diagnosis of GE junction adenocarcinoma.   On 06/07/2023, staging PET/CT showed large hypermetabolic mass involving the distal esophagus and proximal stomach consistent with known primary esophageal carcinoma.  Multiple hypermetabolic retroperitoneal, lower mediastinal lymph nodes consistent with locally advanced disease.  Suspected direct tumor extension into the pancreatic body.  Hypermetabolic node or peritoneal implant within the omentum.  No other evidence of metastatic disease.   Plan was for palliative systemic treatments with FOLFOX plus nivolumab .  However patient's performance status continued to decline and he had to be hospitalized again on 06/14/2023.  He finally presented to reestablish care in our clinic on 07/18/2023.  His performance status improved with nutrition via J-tube.  Started systemic treatments from 07/23/2023.  We did dose reduce oxaliplatin  by 20% and proceeded with 68 mg/m dose.  When he  presented for cycle 2 on 08/07/2023, leukopenia with white count of 3300, ANC 1100.  We started skipping 5-FU bolus and continue with the dose reduced oxaliplatin .  Restaging PET scan on 09/10/2023 showed excellent response with significant reduction in size and metabolism in  the gastric mass.  Resolution of peritoneal metastatic disease.   Completed 6 cycles of FOLFOX plus nivolumab  as of 11/06/2023.  Transitioned to maintenance treatments with 5-FU plus nivolumab  from 11/20/2023.  Because of progressive weight loss, we obtained restaging scan sooner.  On 12/30/2023, restaging PET scan showed evidence of mild disease progression with uptake in size and number of pathologic lymph nodes in the bilateral lung hilum, mediastinum and retroperitoneum.  Subtle peritoneal focus of carcinomatosis was noted.  Groundglass hazy opacity in the lungs in the right lower lobe and left upper lobe with some low-level uptake.  Since patient could not tolerate FOLFOX plus nivolumab  because of side effects, plan made to change regimen to ramucirumab plus paclitaxel on days 1, 8, 15 of 28-day cycle.  In the future, we could reconsider FOLFOX as needed.  REVIEW OF SYSTEMS:   Review of Systems - Oncology  All other pertinent systems were reviewed with the patient and are negative.  ALLERGIES: He has no known allergies.  MEDICATIONS:  Current Outpatient Medications  Medication Sig Dispense Refill   albuterol  (PROVENTIL ) (2.5 MG/3ML) 0.083% nebulizer solution Take 3 mLs (2.5 mg total) by nebulization every 6 (six) hours as needed for wheezing or shortness of breath. 75 mL 12   albuterol  (VENTOLIN  HFA) 108 (90 Base) MCG/ACT inhaler Inhale 1-2 puffs into the lungs every 4 (four) hours as needed for wheezing or shortness of breath. 18 g 3   Albuterol  Sulfate 2.5 MG/0.5ML NEBU Use 1 vial in nebulizer as directed every 6 hours as needed for wheezing or shortness of breath 30 mL 3   cyanocobalamin  1000 MCG tablet Take 1 tablet (1,000 mcg total) by mouth daily. 30 tablet 6   dronabinol  (MARINOL ) 5 MG capsule Take 1 capsule (5 mg total) by mouth 2 (two) times daily before lunch and supper. 60 capsule 1   folic acid  (FOLVITE ) 1 MG tablet Take 1 tablet (1 mg total) by mouth daily. 30 tablet 6    furosemide  (LASIX ) 20 MG tablet Take 1 tablet (20 mg total) by mouth daily as needed for fluid or edema. 30 tablet 3   hydrocortisone  (CORTEF ) 10 MG tablet Take 2 tablets by mouth every morning and 1 tablet every evening for one week, then one tablet twice daily 50 tablet 1   metoCLOPramide  (REGLAN ) 10 MG tablet Take 1 tablet (10 mg total) by mouth 4 (four) times daily  before meals and at bedtime. 120 tablet 3   ondansetron  (ZOFRAN -ODT) 8 MG disintegrating tablet Dissolve 1 tablet (8 mg total) by mouth every 8 (eight) hours as needed for nausea or vomiting. 30 tablet 3   pantoprazole  (PROTONIX ) 40 MG tablet Take 1 tablet (40 mg total) by mouth 2 (two) times daily. 60 tablet 1   potassium chloride  SA (KLOR-CON  M) 20 MEQ tablet Take 1 tablet (20 mEq total) by mouth 2 (two) times daily. 60 tablet 3   sucralfate  (CARAFATE ) 1 GM/10ML suspension Take 10 mLs (1 g total) by mouth 4 (four) times daily -  with meals and at bedtime. 1200 mL 2   traZODone  (DESYREL ) 100 MG tablet Take 0.5 tablets (50 mg total) by mouth at bedtime. 30 tablet 3   feeding supplement (ENSURE ENLIVE / ENSURE  PLUS) LIQD Take as directed (Patient not taking: Reported on 01/02/2024) 948 mL 12   HYDROmorphone  (DILAUDID ) 2 MG tablet Take 1 tablet (2 mg total) by mouth every 6 (six) hours as needed for severe pain (pain score 7-10). (Patient not taking: Reported on 01/02/2024) 60 tablet 0   lidocaine -prilocaine  (EMLA ) cream Apply to affected area once (Patient not taking: Reported on 01/02/2024) 30 g 3   methadone  (DOLOPHINE ) 5 MG tablet Take 1 tablet (5 mg total) by mouth daily. 30 tablet 0   Nutritional Supplements (FEEDING SUPPLEMENT, OSMOLITE 1.5 CAL,) LIQD At 60 mL/h over 18 hours on a daily basis. (Patient not taking: Reported on 01/02/2024) 30000 mL 0   polyethylene glycol powder (GLYCOLAX /MIRALAX ) 17 GM/SCOOP powder Dissolve 17 g in 4 oz of liquid and take by mouth daily. (Patient not taking: Reported on 01/02/2024) 476 g 0   prochlorperazine   (COMPAZINE ) 10 MG tablet Take 1 tablet (10 mg total) by mouth every 6 (six) hours as needed for nausea or vomiting. 30 tablet 1   senna-docusate (SENOKOT-S) 8.6-50 MG tablet Take 2 tablets by mouth at bedtime. (Patient not taking: Reported on 01/02/2024) 60 tablet 2   Water  For Irrigation, Sterile (FREE WATER ) SOLN Place 100 mLs into feeding tube 4 (four) times daily. (Patient not taking: Reported on 01/02/2024) 12000 mL 1   No current facility-administered medications for this visit.   Facility-Administered Medications Ordered in Other Visits  Medication Dose Route Frequency Provider Last Rate Last Admin   0.9 %  sodium chloride  infusion   Intravenous Continuous Shallon Yaklin, MD   Stopped at 01/02/24 1210   sodium chloride  flush (NS) 0.9 % injection 10 mL  10 mL Intracatheter PRN Abou Sterkel, MD   10 mL at 01/02/24 1214     VITALS:   Blood pressure 97/65, pulse 72, temperature 98.1 F (36.7 C), temperature source Temporal, resp. rate 18, weight 127 lb 12.8 oz (58 kg), SpO2 94%.  Wt Readings from Last 3 Encounters:  01/02/24 127 lb 12.8 oz (58 kg)  12/18/23 120 lb (54.4 kg)  12/04/23 132 lb (59.9 kg)    Body mass index is 20.63 kg/m.   Onc Performance Status - 01/02/24 0932       ECOG Perf Status   ECOG Perf Status Capable of only limited selfcare, confined to bed or chair more than 50% of waking hours      KPS SCALE   KPS % SCORE Requires occasional assistance but is able to care for most needs             PHYSICAL EXAM:   Physical Exam Constitutional:      General: He is not in acute distress.    Appearance: Normal appearance.  HENT:     Head: Normocephalic and atraumatic.  Eyes:     General: No scleral icterus.    Conjunctiva/sclera: Conjunctivae normal.  Cardiovascular:     Rate and Rhythm: Normal rate and regular rhythm.     Heart sounds: Normal heart sounds.  Pulmonary:     Effort: Pulmonary effort is normal.     Breath sounds: Normal breath sounds.   Chest:     Comments: Port-A-Cath in place without signs of infection Abdominal:     General: There is no distension.  Lymphadenopathy:     Cervical: No cervical adenopathy.  Neurological:     General: No focal deficit present.     Mental Status: He is oriented to person, place, and time.  Psychiatric:  Mood and Affect: Mood normal.        Behavior: Behavior normal.      LABORATORY DATA:   I have reviewed the data as listed.  Results for orders placed or performed in visit on 01/02/24  CMP (Cancer Center only)  Result Value Ref Range   Sodium 137 135 - 145 mmol/L   Potassium 4.0 3.5 - 5.1 mmol/L   Chloride 104 98 - 111 mmol/L   CO2 29 22 - 32 mmol/L   Glucose, Bld 137 (H) 70 - 99 mg/dL   BUN 9 6 - 20 mg/dL   Creatinine 9.30 9.38 - 1.24 mg/dL   Calcium  8.7 (L) 8.9 - 10.3 mg/dL   Total Protein 5.9 (L) 6.5 - 8.1 g/dL   Albumin 3.3 (L) 3.5 - 5.0 g/dL   AST 12 (L) 15 - 41 U/L   ALT 8 0 - 44 U/L   Alkaline Phosphatase 63 38 - 126 U/L   Total Bilirubin 0.3 0.0 - 1.2 mg/dL   GFR, Estimated >39 >39 mL/min   Anion gap 4 (L) 5 - 15  CBC with Differential (Cancer Center Only)  Result Value Ref Range   WBC Count 4.7 4.0 - 10.5 K/uL   RBC 3.91 (L) 4.22 - 5.81 MIL/uL   Hemoglobin 10.7 (L) 13.0 - 17.0 g/dL   HCT 67.1 (L) 60.9 - 47.9 %   MCV 83.9 80.0 - 100.0 fL   MCH 27.4 26.0 - 34.0 pg   MCHC 32.6 30.0 - 36.0 g/dL   RDW 84.6 88.4 - 84.4 %   Platelet Count 163 150 - 400 K/uL   nRBC 0.0 0.0 - 0.2 %   Neutrophils Relative % 68 %   Neutro Abs 3.2 1.7 - 7.7 K/uL   Lymphocytes Relative 16 %   Lymphs Abs 0.8 0.7 - 4.0 K/uL   Monocytes Relative 12 %   Monocytes Absolute 0.6 0.1 - 1.0 K/uL   Eosinophils Relative 3 %   Eosinophils Absolute 0.1 0.0 - 0.5 K/uL   Basophils Relative 1 %   Basophils Absolute 0.0 0.0 - 0.1 K/uL   Immature Granulocytes 0 %   Abs Immature Granulocytes 0.01 0.00 - 0.07 K/uL     RADIOGRAPHIC STUDIES:  NM PET Image Restag (PS) Skull Base To  Thigh CLINICAL DATA:  Subsequent treatment strategy for adenocarcinoma of the GE junction.  EXAM: NUCLEAR MEDICINE PET SKULL BASE TO THIGH  TECHNIQUE: 5.903 mCi F-18 FDG was injected intravenously. Full-ring PET imaging was performed from the skull base to thigh after the radiotracer. CT data was obtained and used for attenuation correction and anatomic localization.  Fasting blood glucose: 110 mg/dl  COMPARISON:  PET-CT 96/88/7974.  Older exams as well  FINDINGS: Mediastinal blood pool activity: SUV max 1.4  Liver activity: SUV max 1.6  NECK: No specific abnormal uptake identified in the neck including along lymph node change of the submandibular, posterior triangle or internal jugular region. Near symmetric uptake of the visualized intracranial compartment.  Incidental CT findings: Visualized paranasal sinuses and mastoid air cells are clear. Small right-sided maxillary sinus mucous retention cyst or polyp. Nasal septal deviation. Left middle turbinate concha bullosa.  CHEST: On the prior there are bilateral hilar hypermetabolic nodes. These are increased in size and number. The dominant focus on the left which had maximum SUV value of 4.6, today has maximum SUV value of 15.1. In addition the size of the uptake is increased. New right-sided hilar lesion identified on image 55 with  maximum SUV 15.7. In addition there are several new mediastinal hypermetabolic nodes. These areas include AP window, subcarinal and adjacent to the descending thoracic aorta. The subcarinal lesion for example has maximum SUV of 18.2 a and diameter in short axis on image 8315 mm. Lesion next of the descending thoracic aorta is likely conglomeration of multiple nodes has maximum SUV value of 14.6. Tissue in this location measures 2.2 by 0.9 cm on image 87.  There also areas of uptake along the lung parenchyma corresponding to some ill-defined opacities. The most confluent in larger areas  in the superior segment of the right lower lobe with uptake of maximum SUV of 4.2. This area is seen on image 87. There also 2 areas in the left upper lobe which are also somewhat ground-glass and associated with some bronchial wall thickening and opacity. Anterior focus has maximum SUV of 4.0. these have a differential including infectious or inflammatory. Neoplasm would also be in the differential. Would recommend short follow-up.  Incidental CT findings: Some increase in small left pleural effusion. Breathing motion. Right IJ chest port with tip of the catheter extending to the SVC right atrial junction. Trace pericardial fluid. Slightly patulous thoracic esophagus with some wall thickening distally.  ABDOMEN/PELVIS: Previously there is nodular thickening along the upper stomach with some uptake of maximum SUV value of 4.6. The level of thickening is slightly decreased but the stomach is more distended with debris and fluid. Uptake in this similar location has maximum SUV of 2.5. Otherwise there is physiologic distribution radiotracer along the parenchymal organs, bowel and renal collecting systems. However there are new hypermetabolic upper abdominal retroperitoneal nodes. Areas include aortocaval, retrocrural, left periaortic. Examples left periaortic have maximum SUV value of 14.3. Node in this location on image 132 measures 2.5 by 1.5 cm on the CT scan. Previously node in this location would have measured 16 by 11 mm on the prior. Uptake of nodes in this location were only maximum SUV of 6.3.  There is also a focus of uptake with maximum SUV value of 6.3 corresponding to an area of nodular thickening in the left upper quadrant abutting the peritoneum at the level of the splenic flexure of the colon. This focus on image 117 measures 9 by 4 mm and is new from previous. This is worrisome for peritoneal area of carcinomatosis. There is some stranding in the mesentery which  is increased from previous particularly in the upper abdomen.  Incidental CT findings: On this limited CT scan for attenuation correction, once again there is some a Paddock cystic foci. Gallbladder is present. The spleen, adrenal glands are unremarkable. No abnormal calcifications seen within the kidneys or along the course of the ureters. The bladder is underdistended. There is a small amount of free fluid dependently in the pelvis. Normal caliber aorta and IVC with some scattered vascular calcifications. The previous J tube is no longer identified. Motion.  SKELETON: No abnormal uptake identified along the visualized osseous structures.  Incidental CT findings: Scattered degenerative changes.  IMPRESSION: Overall progression of disease.  Uptake, size and number of pathologic nodes has increased including bilateral lung hilum, mediastinum and retroperitoneum.  There is also a subtle nodule with uptake in the left upper quadrant the abdomen abutting the peritoneal lining at the level of the splenic flexure. A subtle peritoneal focus of carcinomatosis is possible. There is increasing stranding in the mesentery and some free fluid now in the dependent pelvis.  Increased small left pleural effusion.  There is  some ground-glass hazy opacity in the lungs in the right lower lobe and left upper lobe with some low-level uptake. Although neoplasm would be in the differential these could be more infectious or inflammatory. Please correlate with clinical presentation and short term CT follow-up is recommended.  Electronically Signed   By: Ranell Bring M.D.   On: 12/31/2023 14:15    CODE STATUS:  Code Status History     Date Active Date Inactive Code Status Order ID Comments User Context   06/14/2023 1621 07/11/2023 2225 Full Code [id number]  Celinda Alm Lot, MD Inpatient   06/13/2023 1253 06/14/2023 0509 Full Code [id number]  Philip Cornet, MD H[facility]   05/27/2023 1516 05/30/2023 1651  Full Code [id number]  Zella Katha HERO, MD ED    Questions for Most Recent Historical Code Status (Order [id number])     Question Answer   By: Consent: discussion documented in EHR            Orders Placed This Encounter  Procedures   CBC with Differential (Cancer Center Only)    Standing Status:   Future    Expected Date:   01/17/2024    Expiration Date:   01/16/2025   CMP (Cancer Center only)    Standing Status:   Future    Expected Date:   01/17/2024    Expiration Date:   01/16/2025   T4    Standing Status:   Future    Expected Date:   01/17/2024    Expiration Date:   01/16/2025   TSH    Standing Status:   Future    Expected Date:   01/17/2024    Expiration Date:   01/16/2025   Total Protein, Urine dipstick    Standing Status:   Future    Expected Date:   01/17/2024    Expiration Date:   01/16/2025   CBC with Differential (Cancer Center Only)    Standing Status:   Future    Expected Date:   01/24/2024    Expiration Date:   01/23/2025   CMP (Cancer Center only)    Standing Status:   Future    Expected Date:   01/24/2024    Expiration Date:   01/23/2025   CBC with Differential (Cancer Center Only)    Standing Status:   Future    Expected Date:   01/31/2024    Expiration Date:   01/30/2025   CMP (Cancer Center only)    Standing Status:   Future    Expected Date:   01/31/2024    Expiration Date:   01/30/2025   CBC with Differential (Cancer Center Only)    Standing Status:   Future    Expected Date:   02/14/2024    Expiration Date:   02/13/2025   CMP (Cancer Center only)    Standing Status:   Future    Expected Date:   02/14/2024    Expiration Date:   02/13/2025   CBC with Differential (Cancer Center Only)    Standing Status:   Future    Expected Date:   02/21/2024    Expiration Date:   02/20/2025   CMP (Cancer Center only)    Standing Status:   Future    Expected Date:   02/21/2024    Expiration Date:   02/20/2025   CBC with Differential (Cancer Center Only)    Standing Status:    Future    Expected Date:   02/28/2024    Expiration Date:   02/27/2025  CMP (Cancer Center only)    Standing Status:   Future    Expected Date:   02/28/2024    Expiration Date:   02/27/2025     This document was completed utilizing speech recognition software. Grammatical errors, random word insertions, pronoun errors, and incomplete sentences are an occasional consequence of this system due to software limitations, ambient noise, and hardware issues. Any formal questions or concerns about the content, text or information contained within the body of this dictation should be directly addressed to the provider for clarification.

## 2024-01-02 NOTE — Progress Notes (Signed)
 Nutrition Follow-up:  Patient with metastatic adenocarcinoma of GE junction. He is receiving palliative Folfox + Nivolumab  q14d. Patient is under the care of Dr. Autumn.   1/28-1/31 hospital admit - UGI; acute/chronic blood loss anemia  12/13-1/9 hospital admit - FTT S/p open Jtube 12/17 (DME: [facility]) 4/15 - Jtube fell out - not replaced   Patient receiving opdivo  today. Plans to change therapy regimen to taxol/cyramza dicussed at MD visit (to start 7/16)  Met with pt in infusion. Reports holding last treatment was helpful. Appetite has improved and he has gained 7 lbs the last 2 weeks. Pt tolerating small portions. Nausea and vomiting occasionally if he eats too much. He denies nausea, vomiting, diarrhea, constipation.   Medications: protonix  (7/3)  Labs: glucose 137, albumin 3.3  Anthropometrics: Wt 127 lb 12.8 oz today - increased   Estimated Energy Needs  Kcals: 2195-2500 Protein: 103-113 Fluid: >2L  NUTRITION DIAGNOSIS: Inadequate oral intake improving    MALNUTRITION DIAGNOSIS: Severe malnutrition improving    INTERVENTION:  Encourage small frequent meals/snacks  Pt will try having bedtime snack  Encourage high calorie high protein foods to promote wt gain     MONITORING, EVALUATION, GOAL: wt trends, intake   NEXT VISIT: To be scheduled

## 2024-01-02 NOTE — Assessment & Plan Note (Signed)
 Significant weight loss of approximately 50 pounds over the last four months, with current weight at 127 pounds.   He has gained 7 pounds in the last 2 weeks which is encouraging.  Treatment plan is being changed as mentioned above.  - Consult with nutritionist to focus on adequate calorie and protein intake. - Use Zofran  (Ondansetron ) as needed for nausea, especially in the days following treatment.

## 2024-01-04 ENCOUNTER — Inpatient Hospital Stay: Attending: Internal Medicine

## 2024-01-07 NOTE — Progress Notes (Signed)
 Pharmacist Chemotherapy Monitoring - Initial Assessment    Anticipated start date: 01/15/24   The following has been reviewed per standard work regarding the patient's treatment regimen: The patient's diagnosis, treatment plan and drug doses, and organ/hematologic function Lab orders and baseline tests specific to treatment regimen  The treatment plan start date, drug sequencing, and pre-medications Prior authorization status  Patient's documented medication list, including drug-drug interaction screen and prescriptions for anti-emetics and supportive care specific to the treatment regimen The drug concentrations, fluid compatibility, administration routes, and timing of the medications to be used The patient's access for treatment and lifetime cumulative dose history, if applicable  The patient's medication allergies and previous infusion related reactions, if applicable   Changes made to treatment plan:  N/A  Follow up needed:  N/A   Makale Pindell, Pharm.D., CPP 01/07/2024@4 :36 PM

## 2024-01-08 ENCOUNTER — Other Ambulatory Visit (HOSPITAL_COMMUNITY): Payer: Self-pay

## 2024-01-08 ENCOUNTER — Encounter: Payer: Self-pay | Admitting: Oncology

## 2024-01-10 ENCOUNTER — Other Ambulatory Visit: Payer: Self-pay | Admitting: Oncology

## 2024-01-10 DIAGNOSIS — C160 Malignant neoplasm of cardia: Secondary | ICD-10-CM

## 2024-01-15 ENCOUNTER — Ambulatory Visit (HOSPITAL_BASED_OUTPATIENT_CLINIC_OR_DEPARTMENT_OTHER): Admitting: Oncology

## 2024-01-15 ENCOUNTER — Ambulatory Visit

## 2024-01-15 ENCOUNTER — Other Ambulatory Visit: Payer: Self-pay

## 2024-01-15 ENCOUNTER — Encounter: Payer: Self-pay | Admitting: Oncology

## 2024-01-15 VITALS — BP 128/78 | HR 62 | Resp 18

## 2024-01-15 VITALS — BP 117/82 | HR 72 | Temp 98.5°F | Resp 17 | Ht 66.0 in | Wt 126.0 lb

## 2024-01-15 DIAGNOSIS — R112 Nausea with vomiting, unspecified: Secondary | ICD-10-CM

## 2024-01-15 DIAGNOSIS — C160 Malignant neoplasm of cardia: Secondary | ICD-10-CM | POA: Diagnosis not present

## 2024-01-15 DIAGNOSIS — Z5111 Encounter for antineoplastic chemotherapy: Secondary | ICD-10-CM | POA: Diagnosis not present

## 2024-01-15 DIAGNOSIS — Z95828 Presence of other vascular implants and grafts: Secondary | ICD-10-CM

## 2024-01-15 LAB — TSH: TSH: 1.24 u[IU]/mL (ref 0.350–4.500)

## 2024-01-15 LAB — CMP (CANCER CENTER ONLY)
ALT: 7 U/L (ref 0–44)
AST: 12 U/L — ABNORMAL LOW (ref 15–41)
Albumin: 3.3 g/dL — ABNORMAL LOW (ref 3.5–5.0)
Alkaline Phosphatase: 63 U/L (ref 38–126)
Anion gap: 4 — ABNORMAL LOW (ref 5–15)
BUN: 8 mg/dL (ref 6–20)
CO2: 30 mmol/L (ref 22–32)
Calcium: 8.7 mg/dL — ABNORMAL LOW (ref 8.9–10.3)
Chloride: 103 mmol/L (ref 98–111)
Creatinine: 0.65 mg/dL (ref 0.61–1.24)
GFR, Estimated: >60 mL/min (ref >60)
Glucose, Bld: 102 mg/dL — ABNORMAL HIGH (ref 70–99)
Potassium: 4 mmol/L (ref 3.5–5.1)
Sodium: 137 mmol/L (ref 135–145)
Total Bilirubin: 0.4 mg/dL (ref 0.0–1.2)
Total Protein: 6.1 g/dL — ABNORMAL LOW (ref 6.5–8.1)

## 2024-01-15 LAB — CBC WITH DIFFERENTIAL (CANCER CENTER ONLY)
Abs Immature Granulocytes: 0.02 K/uL (ref 0.00–0.07)
Basophils Absolute: 0 K/uL (ref 0.0–0.1)
Basophils Relative: 1 %
Eosinophils Absolute: 0.2 K/uL (ref 0.0–0.5)
Eosinophils Relative: 5 %
HCT: 35.2 % — ABNORMAL LOW (ref 39.0–52.0)
Hemoglobin: 11.4 g/dL — ABNORMAL LOW (ref 13.0–17.0)
Immature Granulocytes: 1 %
Lymphocytes Relative: 14 %
Lymphs Abs: 0.6 K/uL — ABNORMAL LOW (ref 0.7–4.0)
MCH: 27.1 pg (ref 26.0–34.0)
MCHC: 32.4 g/dL (ref 30.0–36.0)
MCV: 83.6 fL (ref 80.0–100.0)
Monocytes Absolute: 0.4 K/uL (ref 0.1–1.0)
Monocytes Relative: 11 %
Neutro Abs: 2.9 K/uL (ref 1.7–7.7)
Neutrophils Relative %: 68 %
Platelet Count: 174 K/uL (ref 150–400)
RBC: 4.21 MIL/uL — ABNORMAL LOW (ref 4.22–5.81)
RDW: 14.8 % (ref 11.5–15.5)
WBC Count: 4.1 K/uL (ref 4.0–10.5)
nRBC: 0 % (ref 0.0–0.2)

## 2024-01-15 LAB — TOTAL PROTEIN, URINE DIPSTICK: Protein, ur: NEGATIVE mg/dL

## 2024-01-15 MED ORDER — SODIUM CHLORIDE 0.9% FLUSH
10.0000 mL | Freq: Once | INTRAVENOUS | Status: AC
  Administered 2024-01-15: 10 mL

## 2024-01-15 MED ORDER — ONDANSETRON HCL 4 MG/2ML IJ SOLN
8.0000 mg | Freq: Once | INTRAMUSCULAR | Status: AC
  Administered 2024-01-15: 8 mg via INTRAVENOUS

## 2024-01-15 MED ORDER — SODIUM CHLORIDE 0.9 % IV SOLN
8.0000 mg/kg | Freq: Once | INTRAVENOUS | Status: AC
  Administered 2024-01-15: 500 mg via INTRAVENOUS
  Filled 2024-01-15: qty 50

## 2024-01-15 MED ORDER — FAMOTIDINE IN NACL 20-0.9 MG/50ML-% IV SOLN
20.0000 mg | Freq: Once | INTRAVENOUS | Status: AC
  Administered 2024-01-15: 20 mg via INTRAVENOUS
  Filled 2024-01-15: qty 50

## 2024-01-15 MED ORDER — SODIUM CHLORIDE 0.9 % IV SOLN
80.0000 mg/m2 | Freq: Once | INTRAVENOUS | Status: AC
  Administered 2024-01-15: 132 mg via INTRAVENOUS
  Filled 2024-01-15: qty 22

## 2024-01-15 MED ORDER — SODIUM CHLORIDE 0.9 % IV SOLN: INTRAVENOUS | Status: DC

## 2024-01-15 MED ORDER — DEXAMETHASONE SODIUM PHOSPHATE 10 MG/ML IJ SOLN
10.0000 mg | Freq: Once | INTRAMUSCULAR | Status: AC
  Administered 2024-01-15: 10 mg via INTRAVENOUS
  Filled 2024-01-15: qty 1

## 2024-01-15 MED ORDER — DIPHENHYDRAMINE HCL 50 MG/ML IJ SOLN
50.0000 mg | Freq: Once | INTRAMUSCULAR | Status: AC
  Administered 2024-01-15: 25 mg via INTRAVENOUS
  Filled 2024-01-15: qty 1

## 2024-01-15 NOTE — Progress Notes (Signed)
 [facility]  ONCOLOGY CLINIC PROGRESS NOTE   Patient Care Team: [name], NP as PCP - General (Pulmonary Disease) Autumn Millman, MD as Consulting Physician (Oncology) Rosalie Kitchens, MD as Consulting Physician (Gastroenterology) Pickenpack-Cousar, Fannie SAILOR, NP as Nurse Practitioner [facility] and Palliative Medicine)  PATIENT NAME: Jesus Haynes   MR#: [id number] DOB: 1975-09-08  Date of visit: 01/15/2024   ASSESSMENT & PLAN:   Jesus Haynes is a 47 y.o. pleasant gentleman with history of asthma, presented to the ED on 05/27/2023 with complaints of epigastric burning abdominal pain, nausea, retching.  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  HER2/neu negative.  MMR intact.  NGS testing showed no actionable mutations.  Adenocarcinoma of gastroesophageal junction (HCC) -Please review HPI/oncology history for additional details and timeline of events.  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  Discussed treatment options. All treatment options are palliative in nature and not curative intent. Plan made to proceed with palliative systemic treatment using FOLFOX plus nivolumab .   -He started palliative systemic treatments with FOLFOX plus nivolumab  from 07/23/2023.  He was admitted to the hospital on 07/30/2023 after he presented with chest and epigastric pain, episodes of melena. He was found to have hemoglobin of 6.8. He was admitted for blood transfusion and further evaluation.  GI deferred additional workup/EGD since the cause of blood loss seemed obvious from malignancy. He was discharged home on 08/02/2023.   -He resumed systemic treatments with cycle 2 of FOLFOX plus nivolumab  on 08/07/2023 without 5-FU bolus and a 20% dose  reduction of oxaliplatin  and tolerated the chemotherapy and immunotherapy reasonably well.  Did not experience major side effects.  Recent lower extremity ultrasound to rule out DVT showed possible inguinal lymphadenopathy.  Hence PET/CT was obtained for further evaluation on 09/10/2023.  It showed significant improvement with decreased size and metabolic some of the gastric mass.  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 Hence plan made to continue current management with dose reduced FOLFOX and nivolumab  for up to 6 cycles followed by maintenance treatments.  He received cycle 6 of FOLFOX plus nivolumab  on 11/06/2023. Following cycle 6, plan was to transition him to maintenance treatments with 5-FU and nivolumab , to be continued every 2 weeks.  However because of fatigue, progressive weight loss, we could only use nivolumab  treatments.  Because of progressive weight loss, we obtained restaging scan sooner.  On 12/30/2023, restaging PET scan showed evidence of mild disease progression with uptake in size and number of pathologic lymph nodes in the bilateral lung hilum, mediastinum and retroperitoneum.  Subtle peritoneal focus of carcinomatosis was noted.  Groundglass hazy opacity in the lungs in the right lower lobe and left upper lobe with some low-level uptake.  Since patient could not tolerate FOLFOX plus nivolumab  because of side effects, plan made to change regimen to ramucirumab  plus paclitaxel  on days 1, 8, 15 of 28-day cycle. In the future, we could reconsider FOLFOX as needed.  Discussed side effect profile including risk of thrombosis, wound healing issues, bleeding issues, hypertension, neuropathy, nausea, vomiting, fatigue, altered taste, infusion reactions etc.  Patient verbalized understanding and is willing to proceed with this regimen.  Labs today reveal no dose-limiting toxicities.  Will proceed with cycle 1 day 1 of ramucirumab  plus paclitaxel .  RTC in 1 week  for labs, office  visit and continuation of chemotherapy.   Nausea and vomiting Intermittent nausea and vomiting, particularly when consuming large meals. Nausea is a persistent issue, but not exacerbated by current treatment. - Continue current antiemetic regimen with Zofran  or Compazine  as needed.   I reviewed lab results and outside records for this visit and discussed relevant results with the patient. Diagnosis, plan of care and treatment options were also discussed in detail with the patient. Opportunity provided to ask questions and answers provided to his apparent satisfaction. Provided instructions to call our clinic with any problems, questions or concerns prior to return visit. I recommended to continue follow-up with PCP and sub-specialists. He verbalized understanding and agreed with the plan.   NCCN guidelines have been consulted in the planning of this patient's care.  I spent a total of 30 minutes during this encounter with the patient including review of chart and various tests results, discussions about plan of care and coordination of care plan.   Chinita Patten, MD  01/15/2024 5:47 PM  [facility] - [facility] 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  NGS testing showed no actionable mutations.  Current Treatment: Palliative systemic treatments with FOLFOX plus nivolumab , started from 07/23/2023.  After completing 6 cycles of FOLFOX + nivolumab , transitioned to maintenance treatments with 5-FU plus nivolumab  from 11/20/2023.  Because of disease progression, treatment switched to ramucirumab  plus paclitaxel  from 01/15/2024.  INTERVAL HISTORY:   Discussed the use of AI scribe software for clinical note transcription with the  patient, who gave verbal consent to proceed.  History of Present Illness  Jesus Haynes is a 48 year old male with esophageal cancer who presents for chemotherapy treatment.  He is undergoing treatment for esophageal cancer, with recent imaging showing improvement in the esophagus but new spots in the lungs and activity in the intestinal lining. The esophageal tumor has reduced significantly with less thickening and activity compared to previous scans.  He is currently on a chemotherapy regimen including Taxol  and Ramisumab. He has not experienced significant neuropathy with previous treatments. His blood counts, liver function, and kidney function have been within normal limits on recent labs.  His weight is stable at 126, with no recent nausea or vomiting. He experiences pain around the J tube site, which is controlled with methadone  5 mg taken once daily at night.  No nausea or vomiting. Pain around the J tube site is controlled with methadone .    I have reviewed the past medical history, past surgical history, social history and family history with the patient and they are unchanged from previous note.  HISTORY OF PRESENT ILLNESS:   Oncology History  Adenocarcinoma of gastroesophageal junction (HCC)  05/29/2023 Initial Diagnosis   Adenocarcinoma of gastroesophageal junction (HCC)   07/18/2023 Cancer Staging   Staging form: Esophagus - Adenocarcinoma, AJCC 8th Edition - Clinical: Stage IVB (cT4, cN3, cM1, G3) - Signed by [name], MD on 07/18/2023 Histologic grading system: 3 grade system   07/23/2023 - 01/02/2024 Chemotherapy   Patient is on Treatment Plan : GASTROESOPHAGEAL FOLFOX + Nivolumab  q14d     01/15/2024 -  Chemotherapy   Patient is on Treatment Plan : GASTROESOPHAGEAL Ramucirumab  D1, 15 + Paclitaxel  D1,8,15 q28d  48 y.o. gentleman with history of asthma, presented to the ED on 05/27/2023 with complaints of epigastric burning abdominal pain, nausea, retching.   He also reported some dark tarry stools for 3 days prior to arrival.  He was previously seen in the ED at [facility] on 05/08/2023 with complaints of chest or right upper quadrant abdominal pain.  Workup was unremarkable at that time and he was sent home with Protonix  and Carafate .  Since his symptoms persisted, he presented to the ED again.   In the ED, his hemoglobin was noted to be down to 10, compared to 13 previously.  With concern for upper GI bleed, was admitted for further evaluation and management.   Dr. Magod performed upper GI endoscopy on 05/28/2023.  It showed partially obstructing, likely malignant esophageal tumor in the lower third of the esophagus.  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  Normal duodenum.  Pathology from GE junction mass came back positive for invasive adenocarcinoma, moderately to poorly differentiated.  MMR intact.  HER2/neu negative.  Guardant 360 NGS testing showed TP53 C176F mutation.  No evidence of MSI-high status.  Low TMB (4.75 mut/Mb).  Overall no actionable mutations.   CT chest abdomen pelvis on 05/28/2023 showed abnormal wall thickening at the GE junction, compatible with patient's known carcinoma.  Prominent lymph nodes in the retrocrural region on the right side, adjacent to the hiatal hernia.  Abnormal low-density soft tissue in the gastrohepatic region, abutting the lesser curvature and proximal stomach, worrisome for metastatic disease.  Periportal, periceliac, gastrosplenic and left retroperitoneal lymphadenopathy, worrisome for metastatic involvement.  Hypodensities in the liver were also noted, worrisome for metastatic disease, largest 1 measuring 13 mm in the inferior right lobe.  Fat stranding surrounding the body and tail of pancreas, suggesting acute pancreatitis.   We were consulted during his hospitalization on 05/29/2023 for additional recommendations given new diagnosis of GE junction adenocarcinoma.   On 06/07/2023, staging PET/CT  showed large hypermetabolic mass involving the distal esophagus and proximal stomach consistent with known primary esophageal carcinoma.  Multiple hypermetabolic retroperitoneal, lower mediastinal lymph nodes consistent with locally advanced disease.  Suspected direct tumor extension into the pancreatic body.  Hypermetabolic node or peritoneal implant within the omentum.  No other evidence of metastatic disease.   Plan was for palliative systemic treatments with FOLFOX plus nivolumab .  However patient's performance status continued to decline and he had to be hospitalized again on 06/14/2023.  He finally presented to reestablish care in our clinic on 07/18/2023.  His performance status improved with nutrition via J-tube.  Started systemic treatments from 07/23/2023.  We did dose reduce oxaliplatin  by 20% and proceeded with 68 mg/m dose.  When he presented for cycle 2 on 08/07/2023, leukopenia with white count of 3300, ANC 1100.  We started skipping 5-FU bolus and continue with the dose reduced oxaliplatin .  Restaging PET scan on 09/10/2023 showed excellent response with significant reduction in size and metabolism in the gastric mass.  Resolution of peritoneal metastatic disease.   Completed 6 cycles of FOLFOX plus nivolumab  as of 11/06/2023.  Transitioned to maintenance treatments with 5-FU plus nivolumab  from 11/20/2023.  Because of progressive weight loss, we obtained restaging scan sooner.  On 12/30/2023, restaging PET scan showed evidence of mild disease progression with uptake in size and number of pathologic lymph nodes in the bilateral lung hilum, mediastinum and retroperitoneum.  Subtle peritoneal focus of carcinomatosis was noted.  Groundglass hazy opacity in the lungs in the right lower lobe  and left upper lobe with some low-level uptake.  Since patient could not tolerate FOLFOX plus nivolumab  because of side effects, plan made to change regimen to ramucirumab  plus paclitaxel  on days 1, 8, 15 of  28-day cycle.  In the future, we could reconsider FOLFOX as needed.  REVIEW OF SYSTEMS:   Review of Systems - Oncology  All other pertinent systems were reviewed with the patient and are negative.  ALLERGIES: He has no known allergies.  MEDICATIONS:  Current Outpatient Medications  Medication Sig Dispense Refill   albuterol  (PROVENTIL ) (2.5 MG/3ML) 0.083% nebulizer solution Take 3 mLs (2.5 mg total) by nebulization every 6 (six) hours as needed for wheezing or shortness of breath. 75 mL 12   albuterol  (VENTOLIN  HFA) 108 (90 Base) MCG/ACT inhaler Inhale 1-2 puffs into the lungs every 4 (four) hours as needed for wheezing or shortness of breath. 18 g 3   Albuterol  Sulfate 2.5 MG/0.5ML NEBU Use 1 vial in nebulizer as directed every 6 hours as needed for wheezing or shortness of breath 30 mL 3   cyanocobalamin  1000 MCG tablet Take 1 tablet (1,000 mcg total) by mouth daily. 30 tablet 6   dronabinol  (MARINOL ) 5 MG capsule Take 1 capsule (5 mg total) by mouth 2 (two) times daily before lunch and supper. 60 capsule 1   folic acid  (FOLVITE ) 1 MG tablet Take 1 tablet (1 mg total) by mouth daily. 30 tablet 6   furosemide  (LASIX ) 20 MG tablet Take 1 tablet (20 mg total) by mouth daily as needed for fluid or edema. 30 tablet 3   methadone  (DOLOPHINE ) 5 MG tablet Take 1 tablet (5 mg total) by mouth daily. 30 tablet 0   metoCLOPramide  (REGLAN ) 10 MG tablet Take 1 tablet (10 mg total) by mouth 4 (four) times daily  before meals and at bedtime. 120 tablet 3   ondansetron  (ZOFRAN -ODT) 8 MG disintegrating tablet Dissolve 1 tablet (8 mg total) by mouth every 8 (eight) hours as needed for nausea or vomiting. 30 tablet 3   pantoprazole  (PROTONIX ) 40 MG tablet Take 1 tablet (40 mg total) by mouth 2 (two) times daily. 60 tablet 1   polyethylene glycol powder (GLYCOLAX /MIRALAX ) 17 GM/SCOOP powder Dissolve 17 g in 4 oz of liquid and take by mouth daily. 476 g 0   potassium chloride  SA (KLOR-CON  M) 20 MEQ tablet  Take 1 tablet (20 mEq total) by mouth 2 (two) times daily. 60 tablet 3   prochlorperazine  (COMPAZINE ) 10 MG tablet Take 1 tablet (10 mg total) by mouth every 6 (six) hours as needed for nausea or vomiting. 30 tablet 1   traZODone  (DESYREL ) 100 MG tablet Take 0.5 tablets (50 mg total) by mouth at bedtime. 30 tablet 3   feeding supplement (ENSURE ENLIVE / ENSURE PLUS) LIQD Take as directed (Patient not taking: Reported on 01/02/2024) 948 mL 12   No current facility-administered medications for this visit.   Facility-Administered Medications Ordered in Other Visits  Medication Dose Route Frequency Provider Last Rate Last Admin   0.9 %  sodium chloride  infusion   Intravenous Continuous Kimela Malstrom, MD   Stopped at 01/15/24 1048     VITALS:   Blood pressure 117/82, pulse 72, temperature 98.5 F (36.9 C), temperature source Temporal, resp. rate 17, height 5' 6 (1.676 m), weight 126 lb (57.2 kg), SpO2 96%.  Wt Readings from Last 3 Encounters:  01/15/24 126 lb (57.2 kg)  01/02/24 127 lb 12.8 oz (58 kg)  12/18/23 120 lb (54.4 kg)  Body mass index is 20.34 kg/m.   Onc Performance Status - 01/15/24 0914       ECOG Perf Status   ECOG Perf Status Capable of only limited selfcare, confined to bed or chair more than 50% of waking hours      KPS SCALE   KPS % SCORE Requires occasional assistance but is able to care for most needs              PHYSICAL EXAM:   Physical Exam Constitutional:      General: He is not in acute distress.    Appearance: Normal appearance.  HENT:     Head: Normocephalic and atraumatic.  Eyes:     General: No scleral icterus.    Conjunctiva/sclera: Conjunctivae normal.  Cardiovascular:     Rate and Rhythm: Normal rate and regular rhythm.     Heart sounds: Normal heart sounds.  Pulmonary:     Effort: Pulmonary effort is normal.     Breath sounds: Normal breath sounds.  Chest:     Comments: Port-A-Cath in place without signs of  infection Abdominal:     General: There is no distension.  Lymphadenopathy:     Cervical: No cervical adenopathy.  Neurological:     General: No focal deficit present.     Mental Status: He is oriented to person, place, and time.  Psychiatric:        Mood and Affect: Mood normal.        Behavior: Behavior normal.      LABORATORY DATA:   I have reviewed the data as listed.  Results for orders placed or performed in visit on 01/15/24  Total Protein, Urine dipstick  Result Value Ref Range   Protein, ur NEGATIVE NEGATIVE mg/dL  TSH  Result Value Ref Range   TSH 1.240 0.350 - 4.500 uIU/mL  CMP (Cancer Center only)  Result Value Ref Range   Sodium 137 135 - 145 mmol/L   Potassium 4.0 3.5 - 5.1 mmol/L   Chloride 103 98 - 111 mmol/L   CO2 30 22 - 32 mmol/L   Glucose, Bld 102 (H) 70 - 99 mg/dL   BUN 8 6 - 20 mg/dL   Creatinine 9.34 9.38 - 1.24 mg/dL   Calcium  8.7 (L) 8.9 - 10.3 mg/dL   Total Protein 6.1 (L) 6.5 - 8.1 g/dL   Albumin 3.3 (L) 3.5 - 5.0 g/dL   AST 12 (L) 15 - 41 U/L   ALT 7 0 - 44 U/L   Alkaline Phosphatase 63 38 - 126 U/L   Total Bilirubin 0.4 0.0 - 1.2 mg/dL   GFR, Estimated >39 >39 mL/min   Anion gap 4 (L) 5 - 15  CBC with Differential (Cancer Center Only)  Result Value Ref Range   WBC Count 4.1 4.0 - 10.5 K/uL   RBC 4.21 (L) 4.22 - 5.81 MIL/uL   Hemoglobin 11.4 (L) 13.0 - 17.0 g/dL   HCT 64.7 (L) 60.9 - 47.9 %   MCV 83.6 80.0 - 100.0 fL   MCH 27.1 26.0 - 34.0 pg   MCHC 32.4 30.0 - 36.0 g/dL   RDW 85.1 88.4 - 84.4 %   Platelet Count 174 150 - 400 K/uL   nRBC 0.0 0.0 - 0.2 %   Neutrophils Relative % 68 %   Neutro Abs 2.9 1.7 - 7.7 K/uL   Lymphocytes Relative 14 %   Lymphs Abs 0.6 (L) 0.7 - 4.0 K/uL   Monocytes Relative 11 %  Monocytes Absolute 0.4 0.1 - 1.0 K/uL   Eosinophils Relative 5 %   Eosinophils Absolute 0.2 0.0 - 0.5 K/uL   Basophils Relative 1 %   Basophils Absolute 0.0 0.0 - 0.1 K/uL   Immature Granulocytes 1 %   Abs Immature  Granulocytes 0.02 0.00 - 0.07 K/uL     RADIOGRAPHIC STUDIES:  NM PET Image Restag (PS) Skull Base To Thigh CLINICAL DATA:  Subsequent treatment strategy for adenocarcinoma of the GE junction.  EXAM: NUCLEAR MEDICINE PET SKULL BASE TO THIGH  TECHNIQUE: 5.903 mCi F-18 FDG was injected intravenously. Full-ring PET imaging was performed from the skull base to thigh after the radiotracer. CT data was obtained and used for attenuation correction and anatomic localization.  Fasting blood glucose: 110 mg/dl  COMPARISON:  PET-CT 96/88/7974.  Older exams as well  FINDINGS: Mediastinal blood pool activity: SUV max 1.4  Liver activity: SUV max 1.6  NECK: No specific abnormal uptake identified in the neck including along lymph node change of the submandibular, posterior triangle or internal jugular region. Near symmetric uptake of the visualized intracranial compartment.  Incidental CT findings: Visualized paranasal sinuses and mastoid air cells are clear. Small right-sided maxillary sinus mucous retention cyst or polyp. Nasal septal deviation. Left middle turbinate concha bullosa.  CHEST: On the prior there are bilateral hilar hypermetabolic nodes. These are increased in size and number. The dominant focus on the left which had maximum SUV value of 4.6, today has maximum SUV value of 15.1. In addition the size of the uptake is increased. New right-sided hilar lesion identified on image 77 with maximum SUV 15.7. In addition there are several new mediastinal hypermetabolic nodes. These areas include AP window, subcarinal and adjacent to the descending thoracic aorta. The subcarinal lesion for example has maximum SUV of 18.2 a and diameter in short axis on image 8315 mm. Lesion next of the descending thoracic aorta is likely conglomeration of multiple nodes has maximum SUV value of 14.6. Tissue in this location measures 2.2 by 0.9 cm on image 87.  There also areas of uptake along  the lung parenchyma corresponding to some ill-defined opacities. The most confluent in larger areas in the superior segment of the right lower lobe with uptake of maximum SUV of 4.2. This area is seen on image 87. There also 2 areas in the left upper lobe which are also somewhat ground-glass and associated with some bronchial wall thickening and opacity. Anterior focus has maximum SUV of 4.0. these have a differential including infectious or inflammatory. Neoplasm would also be in the differential. Would recommend short follow-up.  Incidental CT findings: Some increase in small left pleural effusion. Breathing motion. Right IJ chest port with tip of the catheter extending to the SVC right atrial junction. Trace pericardial fluid. Slightly patulous thoracic esophagus with some wall thickening distally.  ABDOMEN/PELVIS: Previously there is nodular thickening along the upper stomach with some uptake of maximum SUV value of 4.6. The level of thickening is slightly decreased but the stomach is more distended with debris and fluid. Uptake in this similar location has maximum SUV of 2.5. Otherwise there is physiologic distribution radiotracer along the parenchymal organs, bowel and renal collecting systems. However there are new hypermetabolic upper abdominal retroperitoneal nodes. Areas include aortocaval, retrocrural, left periaortic. Examples left periaortic have maximum SUV value of 14.3. Node in this location on image 132 measures 2.5 by 1.5 cm on the CT scan. Previously node in this location would have measured 16 by 11 mm  on the prior. Uptake of nodes in this location were only maximum SUV of 6.3.  There is also a focus of uptake with maximum SUV value of 6.3 corresponding to an area of nodular thickening in the left upper quadrant abutting the peritoneum at the level of the splenic flexure of the colon. This focus on image 117 measures 9 by 4 mm and is new from previous. This is  worrisome for peritoneal area of carcinomatosis. There is some stranding in the mesentery which is increased from previous particularly in the upper abdomen.  Incidental CT findings: On this limited CT scan for attenuation correction, once again there is some a Paddock cystic foci. Gallbladder is present. The spleen, adrenal glands are unremarkable. No abnormal calcifications seen within the kidneys or along the course of the ureters. The bladder is underdistended. There is a small amount of free fluid dependently in the pelvis. Normal caliber aorta and IVC with some scattered vascular calcifications. The previous J tube is no longer identified. Motion.  SKELETON: No abnormal uptake identified along the visualized osseous structures.  Incidental CT findings: Scattered degenerative changes.  IMPRESSION: Overall progression of disease.  Uptake, size and number of pathologic nodes has increased including bilateral lung hilum, mediastinum and retroperitoneum.  There is also a subtle nodule with uptake in the left upper quadrant the abdomen abutting the peritoneal lining at the level of the splenic flexure. A subtle peritoneal focus of carcinomatosis is possible. There is increasing stranding in the mesentery and some free fluid now in the dependent pelvis.  Increased small left pleural effusion.  There is some ground-glass hazy opacity in the lungs in the right lower lobe and left upper lobe with some low-level uptake. Although neoplasm would be in the differential these could be more infectious or inflammatory. Please correlate with clinical presentation and short term CT follow-up is recommended.  Electronically Signed   By: Ranell Bring M.D.   On: 12/31/2023 14:15    CODE STATUS:  Code Status History     Date Active Date Inactive Code Status Order ID Comments User Context   06/14/2023 1621 07/11/2023 2225 Full Code [id number]  Celinda Alm Lot, MD Inpatient    06/13/2023 1253 06/14/2023 0509 Full Code [id number]  Philip Cornet, MD [facility]   05/27/2023 1516 05/30/2023 1651 Full Code [id number]  Zella Katha HERO, MD ED    Questions for Most Recent Historical Code Status (Order [id number])     Question Answer   By: Consent: discussion documented in EHR            No orders of the defined types were placed in this encounter.   Future Appointments  Date Time Provider Department Center  01/22/2024  8:30 AM [facility] FLUSH [facility] None  01/22/2024  9:00 AM Hanford Powell BRAVO, NP [facility] None  01/22/2024  9:45 AM [facility] INFUSION [facility] None  01/30/2024  9:15 AM [facility] FLUSH [facility] None  01/30/2024 10:00 AM Ann Hedger K, NP [facility] None  01/30/2024 10:30 AM [facility] PALLIATIVE CARE [facility] None  01/30/2024 11:00 AM [facility] INFUSION [facility] None  02/12/2024 10:45 AM [facility] FLUSH [facility] None  02/12/2024 11:15 AM Huldah Marin, MD [facility] None  02/12/2024 12:15 PM [facility] INFUSION [facility] None  02/19/2024  8:15 AM [facility] FLUSH [facility] None  02/19/2024  8:45 AM Brenin Heidelberger, MD [facility] None  02/19/2024  9:30 AM [facility] INFUSION [facility] None     This document was completed utilizing speech recognition software. Grammatical errors, random  word insertions, pronoun errors, and incomplete sentences are an occasional consequence of this system due to software limitations, ambient noise, and hardware issues. Any formal questions or concerns about the content, text or information contained within the body of this dictation should be directly addressed to the provider for clarification.

## 2024-01-15 NOTE — Assessment & Plan Note (Signed)
-  Please review HPI/oncology history for additional details and timeline of events.  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  Discussed treatment options. All treatment options are palliative in nature and not curative intent. Plan made to proceed with palliative systemic treatment using FOLFOX plus nivolumab .   -He started palliative systemic treatments with FOLFOX plus nivolumab  from 07/23/2023.  He was admitted to the hospital on 07/30/2023 after he presented with chest and epigastric pain, episodes of melena. He was found to have hemoglobin of 6.8. He was admitted for blood transfusion and further evaluation.  GI deferred additional workup/EGD since the cause of blood loss seemed obvious from malignancy. He was discharged home on 08/02/2023.   -He resumed systemic treatments with cycle 2 of FOLFOX plus nivolumab  on 08/07/2023 without 5-FU bolus and a 20% dose reduction of oxaliplatin  and tolerated the chemotherapy and immunotherapy reasonably well.  Did not experience major side effects.  Recent lower extremity ultrasound to rule out DVT showed possible inguinal lymphadenopathy.  Hence PET/CT was obtained for further evaluation on 09/10/2023.  It showed significant improvement with decreased size and metabolic some of the gastric mass.  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 Hence plan made to continue current management with dose reduced FOLFOX and nivolumab  for up to 6 cycles followed by maintenance treatments.  He received cycle 6 of FOLFOX plus nivolumab  on 11/06/2023. Following cycle 6, plan was to transition him to maintenance treatments with 5-FU and nivolumab , to be continued every 2 weeks.  However because of fatigue, progressive weight loss, we could only use nivolumab  treatments.  Because of progressive weight loss, we obtained restaging scan sooner.  On 12/30/2023, restaging PET scan showed  evidence of mild disease progression with uptake in size and number of pathologic lymph nodes in the bilateral lung hilum, mediastinum and retroperitoneum.  Subtle peritoneal focus of carcinomatosis was noted.  Groundglass hazy opacity in the lungs in the right lower lobe and left upper lobe with some low-level uptake.  Since patient could not tolerate FOLFOX plus nivolumab  because of side effects, plan made to change regimen to ramucirumab  plus paclitaxel  on days 1, 8, 15 of 28-day cycle. In the future, we could reconsider FOLFOX as needed.  Discussed side effect profile including risk of thrombosis, wound healing issues, bleeding issues, hypertension, neuropathy, nausea, vomiting, fatigue, altered taste, infusion reactions etc.  Patient verbalized understanding and is willing to proceed with this regimen.  Labs today reveal no dose-limiting toxicities.  Will proceed with cycle 1 day 1 of ramucirumab  plus paclitaxel .  RTC in 1 week for labs, office visit and continuation of chemotherapy.

## 2024-01-15 NOTE — Progress Notes (Signed)
 Patient seen by Dr. Archie Patten Pasam today  Vitals are within treatment parameters:Yes   Labs are within treatment parameters: Yes   Treatment plan has been signed: Yes   Per physician team, Patient is ready for treatment and there are NO modifications to the treatment plan.

## 2024-01-15 NOTE — Patient Instructions (Signed)
 [facility] - A DEPT OF [facility]  Discharge Instructions: Thank you for choosing [facility] to provide your oncology and hematology care.   If you have a lab appointment with the Cancer Center, please go directly to the Cancer Center and check in at the registration area.   Wear comfortable clothing and clothing appropriate for easy access to any Portacath or PICC line.   We strive to give you quality time with your provider. You may need to reschedule your appointment if you arrive late (15 or more minutes).  Arriving late affects you and other patients whose appointments are after yours.  Also, if you miss three or more appointments without notifying the office, you may be dismissed from the clinic at the provider's discretion.      For prescription refill requests, have your pharmacy contact our office and allow 72 hours for refills to be completed.    Today you received the following chemotherapy and/or immunotherapy agents Taxol , cyramza       To help prevent nausea and vomiting after your treatment, we encourage you to take your nausea medication as directed.  BELOW ARE SYMPTOMS THAT SHOULD BE REPORTED IMMEDIATELY: *FEVER GREATER THAN 100.4 F (38 C) OR HIGHER *CHILLS OR SWEATING *NAUSEA AND VOMITING THAT IS NOT CONTROLLED WITH YOUR NAUSEA MEDICATION *UNUSUAL SHORTNESS OF BREATH *UNUSUAL BRUISING OR BLEEDING *URINARY PROBLEMS (pain or burning when urinating, or frequent urination) *BOWEL PROBLEMS (unusual diarrhea, constipation, pain near the anus) TENDERNESS IN MOUTH AND THROAT WITH OR WITHOUT PRESENCE OF ULCERS (sore throat, sores in mouth, or a toothache) UNUSUAL RASH, SWELLING OR PAIN  UNUSUAL VAGINAL DISCHARGE OR ITCHING   Items with * indicate a potential emergency and should be followed up as soon as possible or go to the Emergency Department if any problems should occur.  Please show the CHEMOTHERAPY ALERT CARD or  IMMUNOTHERAPY ALERT CARD at check-in to the Emergency Department and triage nurse.  Should you have questions after your visit or need to cancel or reschedule your appointment, please contact [facility] - A DEPT OF [facility]  Dept: [phone]  and follow the prompts.  Office hours are 8:00 a.m. to 4:30 p.m. Monday - Friday. Please note that voicemails left after 4:00 p.m. may not be returned until the following business day.  We are closed weekends and major holidays. You have access to a nurse at all times for urgent questions. Please call the main number to the clinic Dept: [phone] and follow the prompts.   For any non-urgent questions, you may also contact your provider using MyChart. We now offer e-Visits for anyone 60 and older to request care online for non-urgent symptoms. For details visit [email or web address].   Also download the MyChart app! Go to the app store, search MyChart, open the app, select [facility], and log in with your MyChart username and password.

## 2024-01-16 ENCOUNTER — Telehealth: Payer: Self-pay

## 2024-01-16 LAB — T4: T4, Total: 8.3 ug/dL (ref 4.5–12.0)

## 2024-01-16 NOTE — Telephone Encounter (Signed)
-----   Message from Nurse Selinda E sent at 01/15/2024  1:46 PM EDT ----- Regarding: first time treatment-Taxol /Cyramza -[place] Patient received treatment today for the first time. He tolerated it well with no issues

## 2024-01-16 NOTE — Telephone Encounter (Signed)
 LM for patient that this nurse was calling to see how they were doing after their treatment. Please call back to Dr. Zenda Alpers nurse at [phone] if they have any questions or concerns regarding the treatment.

## 2024-01-17 ENCOUNTER — Encounter

## 2024-01-21 NOTE — Assessment & Plan Note (Addendum)
-  Please review HPI/oncology history for additional details and timeline of events. 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  Discussed treatment options. All treatment options are palliative in nature and not curative intent. Plan made to proceed with palliative systemic treatment using FOLFOX plus nivolumab .  -He started palliative systemic treatments with FOLFOX plus nivolumab  from 07/23/2023.  He was admitted to the hospital on 07/30/2023 after he presented with chest and epigastric pain, episodes of melena. He was found to have hemoglobin of 6.8. He was admitted for blood transfusion and further evaluation.  GI deferred additional workup/EGD since the cause of blood loss seemed obvious from malignancy. He was discharged home on 08/02/2023.  -He resumed systemic treatments with cycle 2 of FOLFOX plus nivolumab  on 08/07/2023 without 5-FU bolus and a 20% dose reduction of oxaliplatin  and tolerated the chemotherapy and immunotherapy reasonably well.  Did not experience major side effects. Recent lower extremity ultrasound to rule out DVT showed possible inguinal lymphadenopathy.  Hence PET/CT was obtained for further evaluation on 09/10/2023.  It showed significant improvement with decreased size and metabolic some of the gastric mass.  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 Hence plan made to continue current management with dose reduced FOLFOX and nivolumab  for up to 6 cycles followed by maintenance treatments. He received cycle 6 of FOLFOX plus nivolumab  on 11/06/2023. Following cycle 6, plan was to transition him to maintenance treatments with 5-FU and nivolumab , to be continued every 2 weeks.  However because of fatigue, progressive weight loss, we could only use nivolumab  treatments.Because of progressive weight loss, we obtained restaging scan sooner.  On 12/30/2023, restaging PET scan showed evidence of mild  disease progression with uptake in size and number of pathologic lymph nodes in the bilateral lung hilum, mediastinum and retroperitoneum.  Subtle peritoneal focus of carcinomatosis was noted.  Groundglass hazy opacity in the lungs in the right lower lobe and left upper lobe with some low-level uptake. Since patient could not tolerate FOLFOX plus nivolumab  because of side effects, plan made to change regimen to ramucirumab  plus paclitaxel  on days 1, 8, 15 of 28-day cycle. In the future, we could reconsider FOLFOX as needed. Discussed side effect profile including risk of thrombosis, wound healing issues, bleeding issues, hypertension, neuropathy, nausea, vomiting, fatigue, altered taste, infusion reactions etc.  Patient verbalized understanding and is willing to proceed with this regimen. - Overall, patient tolerated initial dose of paclitaxel  with ramucirumab  fairly well.  Continues to have nausea with decreased appetite.  Did have 5 pound weight loss over the past week. Labs today reveal no dose-limiting toxicities.  Proceed with cycle 1 day 8 of paclitaxel . RTC in 1 week for labs, office visit and continuation of chemotherapy.

## 2024-01-21 NOTE — Progress Notes (Signed)
 Patient Care Team: [name], NP as PCP - General (Pulmonary Disease) Autumn Millman, MD as Consulting Physician (Oncology) Rosalie Kitchens, MD as Consulting Physician (Gastroenterology) Pickenpack-Cousar, Fannie SAILOR, NP as Nurse Practitioner [facility] and Palliative Medicine)  Clinic Day:  01/22/2024  Referring physician: Autumn Millman, MD  ASSESSMENT & PLAN:   Assessment & Plan: Adenocarcinoma of gastroesophageal junction (HCC) -Please review HPI/oncology history for additional details and timeline of events. 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  Discussed treatment options. All treatment options are palliative in nature and not curative intent. Plan made to proceed with palliative systemic treatment using FOLFOX plus nivolumab .  -He started palliative systemic treatments with FOLFOX plus nivolumab  from 07/23/2023.  He was admitted to the hospital on 07/30/2023 after he presented with chest and epigastric pain, episodes of melena. He was found to have hemoglobin of 6.8. He was admitted for blood transfusion and further evaluation.  GI deferred additional workup/EGD since the cause of blood loss seemed obvious from malignancy. He was discharged home on 08/02/2023.  -He resumed systemic treatments with cycle 2 of FOLFOX plus nivolumab  on 08/07/2023 without 5-FU bolus and a 20% dose reduction of oxaliplatin  and tolerated the chemotherapy and immunotherapy reasonably well.  Did not experience major side effects. Recent lower extremity ultrasound to rule out DVT showed possible inguinal lymphadenopathy.  Hence PET/CT was obtained for further evaluation on 09/10/2023.  It showed significant improvement with decreased size and metabolic some of the gastric mass.  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 Hence plan made to continue current management with dose reduced FOLFOX and nivolumab  for up to 6  cycles followed by maintenance treatments. He received cycle 6 of FOLFOX plus nivolumab  on 11/06/2023. Following cycle 6, plan was to transition him to maintenance treatments with 5-FU and nivolumab , to be continued every 2 weeks.  However because of fatigue, progressive weight loss, we could only use nivolumab  treatments.Because of progressive weight loss, we obtained restaging scan sooner.  On 12/30/2023, restaging PET scan showed evidence of mild disease progression with uptake in size and number of pathologic lymph nodes in the bilateral lung hilum, mediastinum and retroperitoneum.  Subtle peritoneal focus of carcinomatosis was noted.  Groundglass hazy opacity in the lungs in the right lower lobe and left upper lobe with some low-level uptake. Since patient could not tolerate FOLFOX plus nivolumab  because of side effects, plan made to change regimen to ramucirumab  plus paclitaxel  on days 1, 8, 15 of 28-day cycle. In the future, we could reconsider FOLFOX as needed. Discussed side effect profile including risk of thrombosis, wound healing issues, bleeding issues, hypertension, neuropathy, nausea, vomiting, fatigue, altered taste, infusion reactions etc.  Patient verbalized understanding and is willing to proceed with this regimen. - Overall, patient tolerated initial dose of paclitaxel  with ramucirumab  fairly well.  Continues to have nausea with decreased appetite.  Did have 5 pound weight loss over the past week. Labs today reveal no dose-limiting toxicities.  Proceed with cycle 1 day 8 of paclitaxel . RTC in 1 week for labs, office visit and continuation of chemotherapy.   Abnormal weight loss Patient has had a 5 pound weight loss over the past 1 week.  Has had increased nausea and decreased appetite.  He has had several episodes of dry heaves.  Reviewed regimen for taking antiemetics postchemotherapy.  Advised he try to stay ahead of symptoms, treating with oral medications at first evidence of nausea.  Will refill antiemetics as indicated.  Encouraged him to aggressively hydrate with water , Gatorade, liquid IV, or other electrolyte substitutions.  He does meet with dietitian to help maintain high-protein diet along with increased calories if tolerated.  Plan Labs reviewed. - Mild anemia and neutropenia. - CMP unremarkable. Patient labs and presentation are appropriate for treatment. Proceed with cycle 1 day 8 chemotherapy Taxol . Labs/flush, follow-up, and chemotherapy treatment in 1 week.  The patient understands the plans discussed today and is in agreement with them.  He knows to contact our office if he develops concerns prior to his next appointment.  I provided 25 minutes of face-to-face time during this encounter and > 50% was spent counseling as documented under my assessment and plan.    Powell FORBES Lessen, NP  [facility] - A DEPT OF [facility] [address] [place] [address] Dept: [phone] Dept Fax: [phone]   No orders of the defined types were placed in this encounter.     CHIEF COMPLAINT:  CC: GE junction adenocarcinoma  Current Treatment: Ramucirumab  and paclitaxel  on days 1, 8, and 15 of 28-day cycle  INTERVAL HISTORY:  Jesus Haynes is here today for repeat clinical assessment.  He last saw Dr. Autumn on 01/15/2024.  Due to negative side effects of FOLFOX and nivolumab , mainly fatigue and progressive weight loss, he started paclitaxel  and ramucirumab  on 01/15/2024.  Today is cycle 1 day 8 of paclitaxel .  He does continue to have nausea and decreased appetite.  Reports a few episodes of dry heaves.  Is not sure yet if this is improved from treatment with FOLFOX and nivolumab .  He denies chest pain, chest pressure, or shortness of breath. He denies headaches or visual disturbances. He denies abdominal pain or changes in bowel or bladder habits.    He denies fevers or chills. His weight has decreased 5  pounds over last week.  I have reviewed the past medical history, past surgical history, social history and family history with the patient and they are unchanged from previous note.  ALLERGIES:  has no known allergies.  MEDICATIONS:  Current Outpatient Medications  Medication Sig Dispense Refill   albuterol  (PROVENTIL ) (2.5 MG/3ML) 0.083% nebulizer solution Take 3 mLs (2.5 mg total) by nebulization every 6 (six) hours as needed for wheezing or shortness of breath. 75 mL 12   albuterol  (VENTOLIN  HFA) 108 (90 Base) MCG/ACT inhaler Inhale 1-2 puffs into the lungs every 4 (four) hours as needed for wheezing or shortness of breath. 18 g 3   Albuterol  Sulfate 2.5 MG/0.5ML NEBU Use 1 vial in nebulizer as directed every 6 hours as needed for wheezing or shortness of breath 30 mL 3   cyanocobalamin  1000 MCG tablet Take 1 tablet (1,000 mcg total) by mouth daily. 30 tablet 6   dronabinol  (MARINOL ) 5 MG capsule Take 1 capsule (5 mg total) by mouth 2 (two) times daily before lunch and supper. 60 capsule 1   folic acid  (FOLVITE ) 1 MG tablet Take 1 tablet (1 mg total) by mouth daily. 30 tablet 6   furosemide  (LASIX ) 20 MG tablet Take 1 tablet (20 mg total) by mouth daily as needed for fluid or edema. 30 tablet 3   methadone  (DOLOPHINE ) 5 MG tablet Take 1 tablet (5 mg total) by mouth daily. 30 tablet 0   metoCLOPramide  (REGLAN ) 10 MG tablet Take 1 tablet (10 mg total) by mouth 4 (four) times daily  before  meals and at bedtime. 120 tablet 3   ondansetron  (ZOFRAN -ODT) 8 MG disintegrating tablet Dissolve 1 tablet (8 mg total) by mouth every 8 (eight) hours as needed for nausea or vomiting. 30 tablet 3   pantoprazole  (PROTONIX ) 40 MG tablet Take 1 tablet (40 mg total) by mouth 2 (two) times daily. 60 tablet 1   polyethylene glycol powder (GLYCOLAX /MIRALAX ) 17 GM/SCOOP powder Dissolve 17 g in 4 oz of liquid and take by mouth daily. 476 g 0   potassium chloride  SA (KLOR-CON  M) 20 MEQ tablet Take 1 tablet (20 mEq  total) by mouth 2 (two) times daily. 60 tablet 3   prochlorperazine  (COMPAZINE ) 10 MG tablet Take 1 tablet (10 mg total) by mouth every 6 (six) hours as needed for nausea or vomiting. 30 tablet 1   traZODone  (DESYREL ) 100 MG tablet Take 0.5 tablets (50 mg total) by mouth at bedtime. 30 tablet 3   feeding supplement (ENSURE ENLIVE / ENSURE PLUS) LIQD Take as directed (Patient not taking: Reported on 01/02/2024) 948 mL 12   No current facility-administered medications for this visit.    HISTORY OF PRESENT ILLNESS:   Oncology History  Adenocarcinoma of gastroesophageal junction (HCC)  05/29/2023 Initial Diagnosis   Adenocarcinoma of gastroesophageal junction (HCC)   07/18/2023 Cancer Staging   Staging form: Esophagus - Adenocarcinoma, AJCC 8th Edition - Clinical: Stage IVB (cT4, cN3, cM1, G3) - Signed by Autumn Millman, MD on 07/18/2023 Histologic grading system: 3 grade system   07/23/2023 - 01/02/2024 Chemotherapy   Patient is on Treatment Plan : GASTROESOPHAGEAL FOLFOX + Nivolumab  q14d     01/15/2024 -  Chemotherapy   Patient is on Treatment Plan : GASTROESOPHAGEAL Ramucirumab  D1, 15 + Paclitaxel  D1,8,15 q28d         REVIEW OF SYSTEMS:   Constitutional: Denies fevers or chills.  He continues to have decreased appetite.  Had 5 pound weight loss in last week. Eyes: Denies blurriness of vision Ears, nose, mouth, throat, and face: Denies mucositis or sore throat Respiratory: Denies cough, dyspnea or wheezes Cardiovascular: Denies palpitation, chest discomfort or lower extremity swelling Gastrointestinal:  Denies heartburn or change in bowel habits.  Continues to have nausea.  Reports several episodes of dry heaves over the last week. Skin: Denies abnormal skin rashes Lymphatics: Denies new lymphadenopathy or easy bruising Neurological:Denies numbness, tingling or new weaknesses Behavioral/Psych: Mood is stable, no new changes  All other systems were reviewed with the patient and are  negative.   VITALS:   Today's Vitals   01/22/24 0913 01/22/24 1000  BP: (!) 100/58   Pulse: 67   Resp: 17   Temp: 98.1 F (36.7 C)   Weight: 121 lb 6.4 oz (55.1 kg)   PainSc:  0-No pain   Body mass index is 19.59 kg/m.   Wt Readings from Last 3 Encounters:  01/22/24 121 lb 6.4 oz (55.1 kg)  01/15/24 126 lb (57.2 kg)  01/02/24 127 lb 12.8 oz (58 kg)    Body mass index is 19.59 kg/m.  Performance status (ECOG): 2 - Symptomatic, <50% confined to bed  PHYSICAL EXAM:   GENERAL:alert, no distress and comfortable.  Appears thin. SKIN: skin color, texture, turgor are normal, no rashes or significant lesions EYES: normal, Conjunctiva are pink and non-injected, sclera clear OROPHARYNX:no exudate, no erythema and lips, buccal mucosa, and tongue normal  NECK: supple, thyroid  normal size, non-tender, without nodularity LYMPH:  no palpable lymphadenopathy in the cervical, axillary or inguinal LUNGS: clear to auscultation and percussion  with normal breathing effort HEART: regular rate & rhythm and no murmurs and no lower extremity edema ABDOMEN:abdomen soft, non-tender and normal bowel sounds Musculoskeletal:no cyanosis of digits and no clubbing  NEURO: alert & oriented x 3 with fluent speech, no focal motor/sensory deficits  LABORATORY DATA:  I have reviewed the data as listed    Component Value Date/Time   NA 137 01/22/2024 0851   NA 139 06/12/2023 1413   K 3.9 01/22/2024 0851   CL 105 01/22/2024 0851   CO2 28 01/22/2024 0851   GLUCOSE 132 (H) 01/22/2024 0851   BUN 6 01/22/2024 0851   BUN 14 06/12/2023 1413   CREATININE 0.59 (L) 01/22/2024 0851   CALCIUM  8.6 (L) 01/22/2024 0851   PROT 5.9 (L) 01/22/2024 0851   PROT 7.0 06/12/2023 1413   ALBUMIN 3.2 (L) 01/22/2024 0851   ALBUMIN 3.9 (L) 06/12/2023 1413   AST 15 01/22/2024 0851   ALT 8 01/22/2024 0851   ALKPHOS 54 01/22/2024 0851   BILITOT 0.4 01/22/2024 0851   GFRNONAA >60 01/22/2024 0851   GFRAA >60 08/24/2018 0750     Lab Results  Component Value Date   WBC 3.0 (L) 01/22/2024   NEUTROABS 1.8 01/22/2024   HGB 11.5 (L) 01/22/2024   HCT 35.9 (L) 01/22/2024   MCV 82.9 01/22/2024   PLT 171 01/22/2024     RADIOGRAPHIC STUDIES: NM PET Image Restag (PS) Skull Base To Thigh Result Date: 12/31/2023 CLINICAL DATA:  Subsequent treatment strategy for adenocarcinoma of the GE junction. EXAM: NUCLEAR MEDICINE PET SKULL BASE TO THIGH TECHNIQUE: 5.903 mCi F-18 FDG was injected intravenously. Full-ring PET imaging was performed from the skull base to thigh after the radiotracer. CT data was obtained and used for attenuation correction and anatomic localization. Fasting blood glucose: 110 mg/dl COMPARISON:  PET-CT 96/88/7974.  Older exams as well FINDINGS: Mediastinal blood pool activity: SUV max 1.4 Liver activity: SUV max 1.6 NECK: No specific abnormal uptake identified in the neck including along lymph node change of the submandibular, posterior triangle or internal jugular region. Near symmetric uptake of the visualized intracranial compartment. Incidental CT findings: Visualized paranasal sinuses and mastoid air cells are clear. Small right-sided maxillary sinus mucous retention cyst or polyp. Nasal septal deviation. Left middle turbinate concha bullosa. CHEST: On the prior there are bilateral hilar hypermetabolic nodes. These are increased in size and number. The dominant focus on the left which had maximum SUV value of 4.6, today has maximum SUV value of 15.1. In addition the size of the uptake is increased. New right-sided hilar lesion identified on image 77 with maximum SUV 15.7. In addition there are several new mediastinal hypermetabolic nodes. These areas include AP window, subcarinal and adjacent to the descending thoracic aorta. The subcarinal lesion for example has maximum SUV of 18.2 a and diameter in short axis on image 8315 mm. Lesion next of the descending thoracic aorta is likely conglomeration of multiple  nodes has maximum SUV value of 14.6. Tissue in this location measures 2.2 by 0.9 cm on image 87. There also areas of uptake along the lung parenchyma corresponding to some ill-defined opacities. The most confluent in larger areas in the superior segment of the right lower lobe with uptake of maximum SUV of 4.2. This area is seen on image 87. There also 2 areas in the left upper lobe which are also somewhat ground-glass and associated with some bronchial wall thickening and opacity. Anterior focus has maximum SUV of 4.0. these have a differential including  infectious or inflammatory. Neoplasm would also be in the differential. Would recommend short follow-up. Incidental CT findings: Some increase in small left pleural effusion. Breathing motion. Right IJ chest port with tip of the catheter extending to the SVC right atrial junction. Trace pericardial fluid. Slightly patulous thoracic esophagus with some wall thickening distally. ABDOMEN/PELVIS: Previously there is nodular thickening along the upper stomach with some uptake of maximum SUV value of 4.6. The level of thickening is slightly decreased but the stomach is more distended with debris and fluid. Uptake in this similar location has maximum SUV of 2.5. Otherwise there is physiologic distribution radiotracer along the parenchymal organs, bowel and renal collecting systems. However there are new hypermetabolic upper abdominal retroperitoneal nodes. Areas include aortocaval, retrocrural, left periaortic. Examples left periaortic have maximum SUV value of 14.3. Node in this location on image 132 measures 2.5 by 1.5 cm on the CT scan. Previously node in this location would have measured 16 by 11 mm on the prior. Uptake of nodes in this location were only maximum SUV of 6.3. There is also a focus of uptake with maximum SUV value of 6.3 corresponding to an area of nodular thickening in the left upper quadrant abutting the peritoneum at the level of the splenic flexure  of the colon. This focus on image 117 measures 9 by 4 mm and is new from previous. This is worrisome for peritoneal area of carcinomatosis. There is some stranding in the mesentery which is increased from previous particularly in the upper abdomen. Incidental CT findings: On this limited CT scan for attenuation correction, once again there is some a Paddock cystic foci. Gallbladder is present. The spleen, adrenal glands are unremarkable. No abnormal calcifications seen within the kidneys or along the course of the ureters. The bladder is underdistended. There is a small amount of free fluid dependently in the pelvis. Normal caliber aorta and IVC with some scattered vascular calcifications. The previous J tube is no longer identified. Motion. SKELETON: No abnormal uptake identified along the visualized osseous structures. Incidental CT findings: Scattered degenerative changes. IMPRESSION: Overall progression of disease. Uptake, size and number of pathologic nodes has increased including bilateral lung hilum, mediastinum and retroperitoneum. There is also a subtle nodule with uptake in the left upper quadrant the abdomen abutting the peritoneal lining at the level of the splenic flexure. A subtle peritoneal focus of carcinomatosis is possible. There is increasing stranding in the mesentery and some free fluid now in the dependent pelvis. Increased small left pleural effusion. There is some ground-glass hazy opacity in the lungs in the right lower lobe and left upper lobe with some low-level uptake. Although neoplasm would be in the differential these could be more infectious or inflammatory. Please correlate with clinical presentation and short term CT follow-up is recommended. Electronically Signed   By: Ranell Bring M.D.   On: 12/31/2023 14:15

## 2024-01-22 ENCOUNTER — Inpatient Hospital Stay

## 2024-01-22 ENCOUNTER — Inpatient Hospital Stay (HOSPITAL_BASED_OUTPATIENT_CLINIC_OR_DEPARTMENT_OTHER): Admitting: Nurse Practitioner

## 2024-01-22 VITALS — BP 100/58 | HR 67 | Temp 98.1°F | Resp 17 | Wt 121.4 lb

## 2024-01-22 DIAGNOSIS — C160 Malignant neoplasm of cardia: Secondary | ICD-10-CM

## 2024-01-22 DIAGNOSIS — Z95828 Presence of other vascular implants and grafts: Secondary | ICD-10-CM

## 2024-01-22 DIAGNOSIS — Z5111 Encounter for antineoplastic chemotherapy: Secondary | ICD-10-CM | POA: Diagnosis not present

## 2024-01-22 LAB — CMP (CANCER CENTER ONLY)
ALT: 8 U/L (ref 0–44)
AST: 15 U/L (ref 15–41)
Albumin: 3.2 g/dL — ABNORMAL LOW (ref 3.5–5.0)
Alkaline Phosphatase: 54 U/L (ref 38–126)
Anion gap: 4 — ABNORMAL LOW (ref 5–15)
BUN: 6 mg/dL (ref 6–20)
CO2: 28 mmol/L (ref 22–32)
Calcium: 8.6 mg/dL — ABNORMAL LOW (ref 8.9–10.3)
Chloride: 105 mmol/L (ref 98–111)
Creatinine: 0.59 mg/dL — ABNORMAL LOW (ref 0.61–1.24)
GFR, Estimated: >60 mL/min (ref >60)
Glucose, Bld: 132 mg/dL — ABNORMAL HIGH (ref 70–99)
Potassium: 3.9 mmol/L (ref 3.5–5.1)
Sodium: 137 mmol/L (ref 135–145)
Total Bilirubin: 0.4 mg/dL (ref 0.0–1.2)
Total Protein: 5.9 g/dL — ABNORMAL LOW (ref 6.5–8.1)

## 2024-01-22 LAB — CBC WITH DIFFERENTIAL (CANCER CENTER ONLY)
Abs Immature Granulocytes: 0.01 K/uL (ref 0.00–0.07)
Basophils Absolute: 0 K/uL (ref 0.0–0.1)
Basophils Relative: 1 %
Eosinophils Absolute: 0.3 K/uL (ref 0.0–0.5)
Eosinophils Relative: 9 %
HCT: 35.9 % — ABNORMAL LOW (ref 39.0–52.0)
Hemoglobin: 11.5 g/dL — ABNORMAL LOW (ref 13.0–17.0)
Immature Granulocytes: 0 %
Lymphocytes Relative: 19 %
Lymphs Abs: 0.6 K/uL — ABNORMAL LOW (ref 0.7–4.0)
MCH: 26.6 pg (ref 26.0–34.0)
MCHC: 32 g/dL (ref 30.0–36.0)
MCV: 82.9 fL (ref 80.0–100.0)
Monocytes Absolute: 0.4 K/uL (ref 0.1–1.0)
Monocytes Relative: 12 %
Neutro Abs: 1.8 K/uL (ref 1.7–7.7)
Neutrophils Relative %: 59 %
Platelet Count: 171 K/uL (ref 150–400)
RBC: 4.33 MIL/uL (ref 4.22–5.81)
RDW: 14.6 % (ref 11.5–15.5)
WBC Count: 3 K/uL — ABNORMAL LOW (ref 4.0–10.5)
nRBC: 0 % (ref 0.0–0.2)

## 2024-01-22 MED ORDER — HEPARIN SOD (PORK) LOCK FLUSH 100 UNIT/ML IV SOLN
500.0000 [IU] | Freq: Once | INTRAVENOUS | Status: AC | PRN
  Administered 2024-01-22: 500 [IU]

## 2024-01-22 MED ORDER — SODIUM CHLORIDE 0.9 % IV SOLN: INTRAVENOUS | Status: DC

## 2024-01-22 MED ORDER — FAMOTIDINE IN NACL 20-0.9 MG/50ML-% IV SOLN
20.0000 mg | Freq: Once | INTRAVENOUS | Status: AC
  Administered 2024-01-22: 20 mg via INTRAVENOUS
  Filled 2024-01-22: qty 50

## 2024-01-22 MED ORDER — DIPHENHYDRAMINE HCL 50 MG/ML IJ SOLN
25.0000 mg | Freq: Once | INTRAMUSCULAR | Status: AC
  Administered 2024-01-22: 25 mg via INTRAVENOUS
  Filled 2024-01-22: qty 1

## 2024-01-22 MED ORDER — DEXAMETHASONE SODIUM PHOSPHATE 10 MG/ML IJ SOLN
10.0000 mg | Freq: Once | INTRAMUSCULAR | Status: AC
  Administered 2024-01-22: 10 mg via INTRAVENOUS
  Filled 2024-01-22: qty 1

## 2024-01-22 MED ORDER — SODIUM CHLORIDE 0.9% FLUSH
10.0000 mL | INTRAVENOUS | Status: DC | PRN
  Administered 2024-01-22: 10 mL

## 2024-01-22 MED ORDER — SODIUM CHLORIDE 0.9 % IV SOLN
80.0000 mg/m2 | Freq: Once | INTRAVENOUS | Status: AC
  Administered 2024-01-22: 132 mg via INTRAVENOUS
  Filled 2024-01-22: qty 22

## 2024-01-22 MED ORDER — SODIUM CHLORIDE 0.9% FLUSH
10.0000 mL | Freq: Once | INTRAVENOUS | Status: AC
  Administered 2024-01-22: 10 mL

## 2024-01-22 NOTE — Patient Instructions (Signed)

## 2024-01-26 ENCOUNTER — Encounter: Payer: Self-pay | Admitting: Oncology

## 2024-01-26 ENCOUNTER — Encounter: Payer: Self-pay | Admitting: Nurse Practitioner

## 2024-01-27 ENCOUNTER — Other Ambulatory Visit (HOSPITAL_COMMUNITY): Payer: Self-pay

## 2024-01-27 ENCOUNTER — Other Ambulatory Visit: Payer: Self-pay | Admitting: Nurse Practitioner

## 2024-01-27 DIAGNOSIS — Z515 Encounter for palliative care: Secondary | ICD-10-CM

## 2024-01-27 DIAGNOSIS — C160 Malignant neoplasm of cardia: Secondary | ICD-10-CM

## 2024-01-27 DIAGNOSIS — G893 Neoplasm related pain (acute) (chronic): Secondary | ICD-10-CM

## 2024-01-27 MED ORDER — HYDROMORPHONE HCL 2 MG PO TABS
1.0000 mg | ORAL_TABLET | Freq: Three times a day (TID) | ORAL | 0 refills | Status: DC | PRN
  Filled 2024-01-27: qty 30, 20d supply, fill #0

## 2024-01-28 ENCOUNTER — Other Ambulatory Visit: Payer: Self-pay

## 2024-01-28 DIAGNOSIS — Z515 Encounter for palliative care: Secondary | ICD-10-CM

## 2024-01-28 DIAGNOSIS — C160 Malignant neoplasm of cardia: Secondary | ICD-10-CM

## 2024-01-28 DIAGNOSIS — R112 Nausea with vomiting, unspecified: Secondary | ICD-10-CM

## 2024-01-28 NOTE — Progress Notes (Signed)
 Orders placed under signed and held or [facility] appt on /30/2025

## 2024-01-29 ENCOUNTER — Other Ambulatory Visit (HOSPITAL_COMMUNITY): Payer: Self-pay

## 2024-01-29 ENCOUNTER — Inpatient Hospital Stay

## 2024-01-29 ENCOUNTER — Encounter: Payer: Self-pay | Admitting: Nurse Practitioner

## 2024-01-29 ENCOUNTER — Inpatient Hospital Stay (HOSPITAL_BASED_OUTPATIENT_CLINIC_OR_DEPARTMENT_OTHER): Admitting: Nurse Practitioner

## 2024-01-29 DIAGNOSIS — R634 Abnormal weight loss: Secondary | ICD-10-CM

## 2024-01-29 DIAGNOSIS — C160 Malignant neoplasm of cardia: Secondary | ICD-10-CM

## 2024-01-29 DIAGNOSIS — Z5111 Encounter for antineoplastic chemotherapy: Secondary | ICD-10-CM | POA: Diagnosis not present

## 2024-01-29 DIAGNOSIS — R530 Neoplastic (malignant) related fatigue: Secondary | ICD-10-CM

## 2024-01-29 DIAGNOSIS — G893 Neoplasm related pain (acute) (chronic): Secondary | ICD-10-CM

## 2024-01-29 DIAGNOSIS — R110 Nausea: Secondary | ICD-10-CM

## 2024-01-29 DIAGNOSIS — Z95828 Presence of other vascular implants and grafts: Secondary | ICD-10-CM

## 2024-01-29 DIAGNOSIS — Z515 Encounter for palliative care: Secondary | ICD-10-CM

## 2024-01-29 DIAGNOSIS — R630 Anorexia: Secondary | ICD-10-CM

## 2024-01-29 DIAGNOSIS — R112 Nausea with vomiting, unspecified: Secondary | ICD-10-CM

## 2024-01-29 LAB — CBC WITH DIFFERENTIAL (CANCER CENTER ONLY)
Abs Immature Granulocytes: 0.01 K/uL (ref 0.00–0.07)
Basophils Absolute: 0 K/uL (ref 0.0–0.1)
Basophils Relative: 1 %
Eosinophils Absolute: 0.1 K/uL (ref 0.0–0.5)
Eosinophils Relative: 5 %
HCT: 35.6 % — ABNORMAL LOW (ref 39.0–52.0)
Hemoglobin: 11.7 g/dL — ABNORMAL LOW (ref 13.0–17.0)
Immature Granulocytes: 1 %
Lymphocytes Relative: 27 %
Lymphs Abs: 0.6 K/uL — ABNORMAL LOW (ref 0.7–4.0)
MCH: 26.8 pg (ref 26.0–34.0)
MCHC: 32.9 g/dL (ref 30.0–36.0)
MCV: 81.5 fL (ref 80.0–100.0)
Monocytes Absolute: 0.2 K/uL (ref 0.1–1.0)
Monocytes Relative: 11 %
Neutro Abs: 1.2 K/uL — ABNORMAL LOW (ref 1.7–7.7)
Neutrophils Relative %: 55 %
Platelet Count: 174 K/uL (ref 150–400)
RBC: 4.37 MIL/uL (ref 4.22–5.81)
RDW: 14.7 % (ref 11.5–15.5)
WBC Count: 2.2 K/uL — ABNORMAL LOW (ref 4.0–10.5)
nRBC: 0 % (ref 0.0–0.2)

## 2024-01-29 LAB — CMP (CANCER CENTER ONLY)
ALT: 9 U/L (ref 0–44)
AST: 16 U/L (ref 15–41)
Albumin: 3.5 g/dL (ref 3.5–5.0)
Alkaline Phosphatase: 47 U/L (ref 38–126)
Anion gap: 8 (ref 5–15)
BUN: 11 mg/dL (ref 6–20)
CO2: 27 mmol/L (ref 22–32)
Calcium: 8.9 mg/dL (ref 8.9–10.3)
Chloride: 101 mmol/L (ref 98–111)
Creatinine: 0.55 mg/dL — ABNORMAL LOW (ref 0.61–1.24)
GFR, Estimated: >60 mL/min (ref >60)
Glucose, Bld: 92 mg/dL (ref 70–99)
Potassium: 3.4 mmol/L — ABNORMAL LOW (ref 3.5–5.1)
Sodium: 136 mmol/L (ref 135–145)
Total Bilirubin: 0.5 mg/dL (ref 0.0–1.2)
Total Protein: 6.3 g/dL — ABNORMAL LOW (ref 6.5–8.1)

## 2024-01-29 LAB — MAGNESIUM: Magnesium: 1.9 mg/dL (ref 1.7–2.4)

## 2024-01-29 MED ORDER — SODIUM CHLORIDE 0.9% FLUSH
10.0000 mL | Freq: Once | INTRAVENOUS | Status: AC
  Administered 2024-01-29: 10 mL

## 2024-01-29 MED ORDER — SODIUM CHLORIDE 0.9 % IV SOLN: INTRAVENOUS | Status: DC

## 2024-01-29 MED ORDER — ONDANSETRON HCL 4 MG/2ML IJ SOLN
8.0000 mg | Freq: Once | INTRAMUSCULAR | Status: AC
  Administered 2024-01-29: 8 mg via INTRAVENOUS
  Filled 2024-01-29: qty 4

## 2024-01-29 MED ORDER — DEXAMETHASONE SODIUM PHOSPHATE 10 MG/ML IJ SOLN
10.0000 mg | Freq: Once | INTRAMUSCULAR | Status: AC
  Administered 2024-01-29: 10 mg via INTRAVENOUS
  Filled 2024-01-29: qty 1

## 2024-01-29 MED ORDER — FAMOTIDINE IN NACL 20-0.9 MG/50ML-% IV SOLN
20.0000 mg | Freq: Once | INTRAVENOUS | Status: AC
  Administered 2024-01-29: 20 mg via INTRAVENOUS
  Filled 2024-01-29: qty 50

## 2024-01-29 MED ORDER — MIRTAZAPINE 7.5 MG PO TABS
7.5000 mg | ORAL_TABLET | Freq: Every day | ORAL | 0 refills | Status: DC
  Filled 2024-01-29: qty 30, 30d supply, fill #0

## 2024-01-29 NOTE — Progress Notes (Unsigned)
 [facility]   Telephone:([phone] Fax:([phone]    Patient Care Team: [name], NP as PCP - General (Pulmonary Disease) Autumn Millman, MD as Consulting Physician (Oncology) Rosalie Kitchens, MD as Consulting Physician (Gastroenterology) Pickenpack-Cousar, Fannie SAILOR, NP as Nurse Practitioner (Hospice and Palliative Medicine)   CHIEF COMPLAINT: Follow up GEJ cancer   Oncology History  Adenocarcinoma of gastroesophageal junction (HCC)  05/29/2023 Initial Diagnosis   Adenocarcinoma of gastroesophageal junction (HCC)   07/18/2023 Cancer Staging   Staging form: Esophagus - Adenocarcinoma, AJCC 8th Edition - Clinical: Stage IVB (cT4, cN3, cM1, G3) - Signed by Autumn Millman, MD on 07/18/2023 Histologic grading system: 3 grade system   07/23/2023 - 01/02/2024 Chemotherapy   Patient is on Treatment Plan : GASTROESOPHAGEAL FOLFOX + Nivolumab  q14d     01/15/2024 -  Chemotherapy   Patient is on Treatment Plan : GASTROESOPHAGEAL Ramucirumab  D1, 15 + Paclitaxel  D1,8,15 q28d        CURRENT THERAPY: FOLFOX/Nivolumab , starting 07/23/23  INTERVAL HISTORY Mr. Biller returns for follow up and treatment as scheduled.   ROS   Past Medical History:  Diagnosis Date   Asthma      Past Surgical History:  Procedure Laterality Date   BIOPSY  05/28/2023   Procedure: BIOPSY;  Surgeon: Rosalie Kitchens, MD;  Location: [facility];  Service: Gastroenterology;;   ESOPHAGOGASTRODUODENOSCOPY (EGD) WITH PROPOFOL  N/A 05/28/2023   Procedure: ESOPHAGOGASTRODUODENOSCOPY (EGD) WITH PROPOFOL ;  Surgeon: Rosalie Kitchens, MD;  Location: [facility];  Service: Gastroenterology;  Laterality: N/A;   IR IMAGING GUIDED PORT INSERTION  06/13/2023   UMBILICAL HERNIA REPAIR N/A 06/18/2023   Procedure: PLACEMENT OF J TUBE;  Surgeon: Dasie Leonor CROME, MD;  Location: [facility];  Service: General;  Laterality: N/A;     Outpatient Encounter Medications as of 01/30/2024  Medication Sig Note   albuterol   (PROVENTIL ) (2.5 MG/3ML) 0.083% nebulizer solution Take 3 mLs (2.5 mg total) by nebulization every 6 (six) hours as needed for wheezing or shortness of breath.    albuterol  (VENTOLIN  HFA) 108 (90 Base) MCG/ACT inhaler Inhale 1-2 puffs into the lungs every 4 (four) hours as needed for wheezing or shortness of breath.    Albuterol  Sulfate 2.5 MG/0.5ML NEBU Use 1 vial in nebulizer as directed every 6 hours as needed for wheezing or shortness of breath    cyanocobalamin  1000 MCG tablet Take 1 tablet (1,000 mcg total) by mouth daily.    dronabinol  (MARINOL ) 5 MG capsule Take 1 capsule (5 mg total) by mouth 2 (two) times daily before lunch and supper. 10/30/2023: Will pick up today per caregiver   folic acid  (FOLVITE ) 1 MG tablet Take 1 tablet (1 mg total) by mouth daily.    furosemide  (LASIX ) 20 MG tablet Take 1 tablet (20 mg total) by mouth daily as needed for fluid or edema.    HYDROmorphone  (DILAUDID ) 2 MG tablet Take 0.5 tablets (1 mg total) by mouth every 8 (eight) hours as needed for severe pain (pain score 7-10).    methadone  (DOLOPHINE ) 5 MG tablet Take 1 tablet (5 mg total) by mouth daily.    metoCLOPramide  (REGLAN ) 10 MG tablet Take 1 tablet (10 mg total) by mouth 4 (four) times daily  before meals and at bedtime.    mirtazapine  (REMERON ) 7.5 MG tablet Take 1 tablet (7.5 mg total) by mouth at bedtime.    ondansetron  (ZOFRAN -ODT) 8 MG disintegrating tablet Dissolve 1 tablet (8 mg total) by mouth every 8 (eight)  hours as needed for nausea or vomiting.    pantoprazole  (PROTONIX ) 40 MG tablet Take 1 tablet (40 mg total) by mouth 2 (two) times daily.    polyethylene glycol powder (GLYCOLAX /MIRALAX ) 17 GM/SCOOP powder Dissolve 17 g in 4 oz of liquid and take by mouth daily.    potassium chloride  SA (KLOR-CON  M) 20 MEQ tablet Take 1 tablet (20 mEq total) by mouth 2 (two) times daily.    prochlorperazine  (COMPAZINE ) 10 MG tablet Take 1 tablet (10 mg total) by mouth every 6 (six) hours as needed for nausea  or vomiting.    No facility-administered encounter medications on file as of 01/30/2024.     There were no vitals filed for this visit. There is no height or weight on file to calculate BMI.   ECOG PERFORMANCE STATUS: {CHL ONC ECOG PS:[phone]}  PHYSICAL EXAM GENERAL:alert, no distress and comfortable SKIN: no rash  EYES: sclera clear NECK: without mass LYMPH:  no palpable cervical or supraclavicular lymphadenopathy  LUNGS: clear with normal breathing effort HEART: regular rate & rhythm, no lower extremity edema ABDOMEN: abdomen soft, non-tender and normal bowel sounds NEURO: alert & oriented x 3 with fluent speech, no focal motor/sensory deficits Breast exam:  PAC without erythema    CBC    Latest Ref Rng & Units 01/29/2024    9:16 AM 01/22/2024    8:51 AM 01/15/2024    8:40 AM  CBC  WBC 4.0 - 10.5 K/uL 2.2  3.0  4.1   Hemoglobin 13.0 - 17.0 g/dL 88.2  88.4  88.5   Hematocrit 39.0 - 52.0 % 35.6  35.9  35.2   Platelets 150 - 400 K/uL 174  171  174       CMP     Latest Ref Rng & Units 01/29/2024    9:16 AM 01/22/2024    8:51 AM 01/15/2024    8:40 AM  CMP  Glucose 70 - 99 mg/dL 92  867  897   BUN 6 - 20 mg/dL 11  6  8    Creatinine 0.61 - 1.24 mg/dL 9.44  9.40  9.34   Sodium 135 - 145 mmol/L 136  137  137   Potassium 3.5 - 5.1 mmol/L 3.4  3.9  4.0   Chloride 98 - 111 mmol/L 101  105  103   CO2 22 - 32 mmol/L 27  28  30    Calcium  8.9 - 10.3 mg/dL 8.9  8.6  8.7   Total Protein 6.5 - 8.1 g/dL 6.3  5.9  6.1   Total Bilirubin 0.0 - 1.2 mg/dL 0.5  0.4  0.4   Alkaline Phos 38 - 126 U/L 47  54  63   AST 15 - 41 U/L 16  15  12    ALT 0 - 44 U/L 9  8  7        ASSESSMENT & PLAN:48 year old male with    Adenocarcinoma of gastroesophageal junction (GEJ), 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, RP adenopathy, and peritoneal implant; stage IV, MMR normal, HER2 (0) neg  -Diagnosed 05/2023 with evidence of peritoneal implants, s/p J tube 06/18/23 -Began first line FOLFOX/Nivo  07/23/23, required dose reduction due to poor tolerance  -PET scan 09/10/23 showed significant response with improvement in decreased size and metabolic activity of the gastric mass, no residual peritoneal metastatic disease was evident -After cycle 6 FOLFOX/Nivo he transitioned to maintenance 5FU/Nivo but switched to single agent Nivo for last 3 cycles due to poor tolerance -Restaging PET  12/30/23 showed mild disease progression in lung and nodal metastasis; he switched to second line Taxol  (D1, 8, 15) plus Ramucirumab  (d 1, 15) q28 days starting 01/15/24    Leg edema  -Onset with diagnosis and treatment, stable per pt - Doppler negative for DVT but showed some concern for inguinal lymphadenopathy -Improved, Lasix  stopped   Anemia -Secondary to tumor bleeding and chemotherapy  -Required admission and blood transfusion after C1 chemo for Hgb 6.8 -Improved and stable      PLAN:  No orders of the defined types were placed in this encounter.     All questions were answered. The patient knows to call the clinic with any problems, questions or concerns. No barriers to learning were detected. I spent *** counseling the patient face to face. The total time spent in the appointment was *** and more than 50% was on counseling, review of test results, and coordination of care.   Wing Gfeller K Adilynn Bessey, NP 01/29/2024 10:31 PM

## 2024-01-29 NOTE — Progress Notes (Signed)
 Palliative Medicine [facility]  Telephone:(336) [phone] Fax:(336) [phone]   Name: Jesus Haynes Date: 01/29/2024 MRN: [id number]  DOB: 1975-11-18  Patient Care Team: [name], NP as PCP - General (Pulmonary Disease) Autumn Millman, MD as Consulting Physician (Oncology) Rosalie Kitchens, MD as Consulting Physician (Gastroenterology) Pickenpack-Cousar, Fannie SAILOR, NP as Nurse Practitioner (Hospice and Palliative Medicine)    INTERVAL HISTORY: Jesus Haynes is a 48 y.o. 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 Palliative ask to see for symptom management and goals of care.   SOCIAL HISTORY:     reports that he quit smoking about 11 years ago. His smoking use included cigarettes. He has never used smokeless tobacco. He reports that he does not drink alcohol and does not use drugs.  ADVANCE DIRECTIVES:  None on file   CODE STATUS: Full code  PAST MEDICAL HISTORY: Past Medical History:  Diagnosis Date   Asthma     ALLERGIES:  has no known allergies.  MEDICATIONS:  Current Outpatient Medications  Medication Sig Dispense Refill   mirtazapine  (REMERON ) 7.5 MG tablet Take 1 tablet (7.5 mg total) by mouth at bedtime. 30 tablet 0   albuterol  (PROVENTIL ) (2.5 MG/3ML) 0.083% nebulizer solution Take 3 mLs (2.5 mg total) by nebulization every 6 (six) hours as needed for wheezing or shortness of breath. 75 mL 12   albuterol  (VENTOLIN  HFA) 108 (90 Base) MCG/ACT inhaler Inhale 1-2 puffs into the lungs every 4 (four) hours as needed for wheezing or shortness of breath. 18 g 3   Albuterol  Sulfate 2.5 MG/0.5ML NEBU Use 1 vial in nebulizer as directed every 6 hours as needed for wheezing or shortness of breath 30 mL 3   cyanocobalamin  1000 MCG tablet Take 1 tablet (1,000 mcg total) by mouth daily. 30 tablet 6   dronabinol  (MARINOL ) 5 MG  capsule Take 1 capsule (5 mg total) by mouth 2 (two) times daily before lunch and supper. 60 capsule 1   folic acid  (FOLVITE ) 1 MG tablet Take 1 tablet (1 mg total) by mouth daily. 30 tablet 6   furosemide  (LASIX ) 20 MG tablet Take 1 tablet (20 mg total) by mouth daily as needed for fluid or edema. 30 tablet 3   HYDROmorphone  (DILAUDID ) 2 MG tablet Take 0.5 tablets (1 mg total) by mouth every 8 (eight) hours as needed for severe pain (pain score 7-10). 30 tablet 0   methadone  (DOLOPHINE ) 5 MG tablet Take 1 tablet (5 mg total) by mouth daily. 30 tablet 0   metoCLOPramide  (REGLAN ) 10 MG tablet Take 1 tablet (10 mg total) by mouth 4 (four) times daily  before meals and at bedtime. 120 tablet 3   ondansetron  (ZOFRAN -ODT) 8 MG disintegrating tablet Dissolve 1 tablet (8 mg total) by mouth every 8 (eight) hours as needed for nausea or vomiting. 30 tablet 3   pantoprazole  (PROTONIX ) 40 MG tablet Take 1 tablet (40 mg total) by mouth 2 (two) times daily. 60 tablet 1   polyethylene glycol powder (GLYCOLAX /MIRALAX ) 17 GM/SCOOP powder Dissolve 17 g in 4 oz of liquid and take by mouth daily. 476 g 0   potassium chloride  SA (KLOR-CON  M) 20 MEQ tablet Take 1 tablet (20 mEq total) by mouth 2 (two) times daily. 60 tablet 3   prochlorperazine  (COMPAZINE ) 10 MG tablet Take 1 tablet (10 mg total) by mouth every 6 (six) hours as needed for  nausea or vomiting. 30 tablet 1   No current facility-administered medications for this visit.   Facility-Administered Medications Ordered in Other Visits  Medication Dose Route Frequency Provider Last Rate Last Admin   0.9 %  sodium chloride  infusion   Intravenous Continuous Pickenpack-Cousar, Fannie SAILOR, NP   Stopped at 01/29/24 1155    VITAL SIGNS: There were no vitals taken for this visit. There were no vitals filed for this visit.  Estimated body mass index is 18.93 kg/m as calculated from the following:   Height as of 01/15/24: 5' 6 (1.676 m).   Weight as of an earlier  encounter on 01/29/24: 117 lb 4.8 oz (53.2 kg).  PERFORMANCE STATUS (ECOG) : 1 - Symptomatic but completely ambulatory  Physical Exam General: NAD Cardiovascular: regular rate and rhythm Pulmonary: normal breathing pattern Extremities: no edema, no joint deformities Skin: no rashes Neurological: AAO x3  IMPRESSION: Discussed the use of AI scribe software for clinical note transcription with the patient, who gave verbal consent to proceed. History of Present Illness Jesus Haynes is a 48 year old male who presents for symptom management follow-up. He is accompanied by family. Denies concerns for constipation or diarrhea. Is reporting persistent nausea, decrease appetite, and increase in abdominal pain.   He has difficulty swallowing large pills, such as potassium supplements, which cause gagging. He manages this by dissolving pills in applesauce to avoid triggering his gag reflex.  Patient potassium today 3.4.  He plans to restart oral potassium as prescribed.  He experiences persistent nausea, which worsens with medication intake, leading to dry heaving and occasional vomiting, especially when swallowing pills. The nausea is constant, with relief only after vomiting at times. He uses Reglan  twice daily and Zofran  twice daily. He has not been using Compazine . Jesus Haynes reports he was told to discontinue however feels this was due to no longer on treatment during that time. I have instructed patient to continue with use of Reglan  acknowledging he may take before meals and at bedtime.  Patient is currently only taking twice alert.  Also advised he may take Zofran  every 8 hours as needed.  We discussed taking medications prophylactically to stay ahead of his nausea.  Patient is weight is down to 117 pounds from 121 pounds on 7/23.  He reports drinking Gatorade and some soft foods however due to his nausea and dry heaving this has caused a decrease in his appetite.  Patient and family verbalized  understanding.  Will continue to closely monitor.  He takes 100 mg of trazodone  at bedtime but struggles to maintain sleep despite feeling exhausted.  He no longer feels that current regimen is effective.  Patient instructed to discontinue use of trazodone  and will start mirtazapine  7.5 mg at bedtime.  Education provided on benefits of appetite stimulation in addition to hopefully increasing better sleep.  Extensive education provided on administration, efficacy, and potential side effects.  Reemphasized patient is to discontinue use of trazodone .  Jesus Haynes reports recurrence of abdominal pain.  Some days are better than others.  He has been able to wean down most of his medication with current regimen only consisting of methadone  5 mg at bedtime.  Patient states pain has increased over the past week and not controlled with current methadone  dose.  We have restarted his hydromorphone  at 1-2 mg every 8 hours as needed for moderate to severe pain.  Education provided on taking medication on empty stomach which can facilitate further nausea.  He verbalized understanding.  Patient  reports over the past 24 hours he is only taken 2 doses of the Dilaudid  which he identifies as controlling his abdominal discomfort.    We will continue to closely monitor and support.  All questions answered and support provided.  Goals of care We discussed his current illness and what it means in the larger context of his ongoing comorbidities.  Natural disease trajectory and expectations were discussed.  Family are realistic in their understanding of incurable disease.  They are clear and expressed wishes to continue to treat the treatable allowing Jesus Haynes every opportunity to continue to thrive as they feel his quality of life was much improved despite understanding of disease progression.  They are focused on his symptom management at this time feeling of his nausea and pain can get better controlled patient will continue to do  well for as long as he can.  Patient and family able to speak to palliative focus treatments including current chemo regimen.  I discussed the importance of continued conversation with family and their medical providers regarding overall plan of care and treatment options, ensuring decisions are within the context of the patients values and GOCs. Assessment & Plan Chemotherapy-induced nausea and vomiting Persistent nausea exacerbated by medication intake, with dry heaving and occasional vomiting. Current Reglan  and Zofran  doses are suboptimal, leading to breakthrough nausea. - Increase Reglan  to every 6 hours, aiming for 3-4 times daily. - Administer Zofran  every 8 hours as needed. - Use Compazine  for breakthrough nausea. - Continue Protonix  for indigestion and to neutralize stomach acids.  Insomnia Difficulty maintaining sleep despite trazodone  use. Trazodone  aids initial sleep onset but not maintenance. Mirtazapine  chosen for its dual benefit of aiding sleep and potentially stimulating appetite. - Discontinue trazodone . - Initiate mirtazapine  7.5 mg at bedtime. - Monitor for allergic reactions and mood changes. - Evaluate effectiveness by Monday or Tuesday. - Avoid concurrent intake of mirtazapine  and Dilaudid ; separate by 1-2 hours.  Pain Chronic abdominal pain managed with Dilaudid  and methadone . Pain management is complicated by nausea, especially when medications are taken on an empty stomach. - Continue Dilaudid  1-2mg  every 8 hours as needed for pain, ensuring it is not used as a sleep aid. - Continue methadone  at night. - Advise against taking pain medications on an empty stomach to prevent nausea.  Weight loss Weight decreased from 121 lbs to 117 lbs. Appetite may be affected by nausea and medication side effects. Mirtazapine  may aid in appetite stimulation. - Monitor weight closely. - Consider mirtazapine 's potential benefit for appetite stimulation.  Hypokalemia Mild  hypokalemia with potassium slightly below normal. Difficulty taking potassium pills due to gag reflex. - Attempt to administer potassium with applesauce to aid swallowing.  I will plan to see patient back in 2-3 weeks.  Sooner if needed.  Patient expressed understanding and was in agreement with this plan. He also understands that He can call the clinic at any time with any questions, concerns, or complaints.   Any controlled substances utilized were prescribed in the context of palliative care. PDMP has been reviewed.   Visit consisted of counseling and education dealing with the complex and emotionally intense issues of symptom management and palliative care in the setting of serious and potentially life-threatening illness.  Levon Borer, AGPCNP-BC  Palliative Medicine Team/[facility]

## 2024-01-29 NOTE — Patient Instructions (Signed)
Nausea, Adult Nausea is feeling like you may vomit. Feeling like you may vomit is usually not serious, but it may be an early sign of a more serious medical problem. Vomiting is when stomach contents forcefully come out of your mouth. If you vomit, or if you are not able to drink enough fluids, you may not have enough water in your body (get dehydrated). If you do not have enough water in your body, you may: Feel tired. Feel thirsty. Have a dry mouth. Have cracked lips. Pee (urinate) less often. Older adults and people who have other diseases or a weak body defense system (immune system) have a higher risk of not having enough water in the body. The main goals of treating this condition are: To relieve your nausea. To ensure your nausea occurs less often. To prevent vomiting and losing too much fluid. Follow these instructions at home: Watch your symptoms for any changes. Tell your doctor about them. Eating and drinking     Take an ORS (oral rehydration solution). This is a drink that is sold at pharmacies and stores. Drink clear fluids in small amounts as you are able. These include: Water. Ice chips. Fruit juice that has water added (diluted fruit juice). Low-calorie sports drinks. Eat bland, easy-to-digest foods in small amounts as you are able, such as: Bananas. Applesauce. Rice. Low-fat (lean) meats. Toast. Crackers. Avoid drinking fluids that have a lot of sugar or caffeine in them. This includes energy drinks, sports drinks, and soda. Avoid alcohol. Avoid spicy or fatty foods. General instructions Take over-the-counter and prescription medicines only as told by your doctor. Rest at home while you get better. Drink enough fluid to keep your pee (urine) pale yellow. Take slow and deep breaths when you feel like you may vomit. Avoid food or things that have strong smells. Wash your hands often with soap and water for at least 20 seconds. If you cannot use soap and water,  use hand sanitizer. Make sure that everyone in your home washes their hands well and often. Keep all follow-up visits. Contact a doctor if: You feel worse. You feel like you may vomit and this lasts for more than 2 days. You vomit. You are not able to drink fluids without vomiting. You have new symptoms. You have a fever. You have a headache. You have muscle cramps. You have a rash. You have pain while peeing. You feel light-headed or dizzy. Get help right away if: You have pain in your chest, neck, arm, or jaw. You feel very weak or you faint. You have vomit that is bright red or looks like coffee grounds. You have bloody or black poop (stools) or poop that looks like tar. You have a very bad headache, a stiff neck, or both. You have very bad pain, cramping, or bloating in your belly (abdomen). You have trouble breathing or you are breathing very quickly. Your heart is beating very quickly. Your skin feels cold and clammy. You feel confused. You have signs of losing too much water in your body, such as: Dark pee, very little pee, or no pee. Cracked lips. Dry mouth. Sunken eyes. Sleepiness. Weakness. These symptoms may be an emergency. Get help right away. Call 911. Do not wait to see if the symptoms will go away. Do not drive yourself to the hospital. Summary Nausea is feeling like you are about vomit. If you vomit, or if you are not able to drink enough fluids, you may not have enough water in   your body (get dehydrated). Eat and drink what your doctor tells you. Take over-the-counter and prescription medicines only as told by your doctor. Contact a doctor right away if your symptoms get worse or you have new symptoms. Keep all follow-up visits. This information is not intended to replace advice given to you by your health care provider. Make sure you discuss any questions you have with your health care provider. Document Revised: 12/23/2020 Document Reviewed:  12/23/2020 Elsevier Patient Education  2024 [employer]  

## 2024-01-30 ENCOUNTER — Other Ambulatory Visit (HOSPITAL_COMMUNITY): Payer: Self-pay

## 2024-01-30 ENCOUNTER — Encounter: Payer: Self-pay | Admitting: Nurse Practitioner

## 2024-01-30 ENCOUNTER — Encounter

## 2024-01-30 ENCOUNTER — Inpatient Hospital Stay

## 2024-01-30 ENCOUNTER — Other Ambulatory Visit

## 2024-01-30 ENCOUNTER — Inpatient Hospital Stay (HOSPITAL_BASED_OUTPATIENT_CLINIC_OR_DEPARTMENT_OTHER): Admitting: Nurse Practitioner

## 2024-01-30 VITALS — BP 126/80 | HR 64 | Temp 98.3°F | Resp 17 | Wt 118.3 lb

## 2024-01-30 DIAGNOSIS — C160 Malignant neoplasm of cardia: Secondary | ICD-10-CM | POA: Diagnosis not present

## 2024-01-30 DIAGNOSIS — Z5111 Encounter for antineoplastic chemotherapy: Secondary | ICD-10-CM | POA: Diagnosis not present

## 2024-01-30 MED ORDER — DEXAMETHASONE SODIUM PHOSPHATE 10 MG/ML IJ SOLN
10.0000 mg | Freq: Once | INTRAMUSCULAR | Status: AC
  Administered 2024-01-30: 10 mg via INTRAVENOUS
  Filled 2024-01-30: qty 1

## 2024-01-30 MED ORDER — SODIUM CHLORIDE 0.9 % IV SOLN
INTRAVENOUS | Status: DC
Start: 2024-01-30 — End: 2024-01-30

## 2024-01-30 MED ORDER — DIPHENHYDRAMINE HCL 50 MG/ML IJ SOLN
25.0000 mg | Freq: Once | INTRAMUSCULAR | Status: AC
  Administered 2024-01-30: 25 mg via INTRAVENOUS
  Filled 2024-01-30: qty 1

## 2024-01-30 MED ORDER — DEXAMETHASONE 4 MG PO TABS
8.0000 mg | ORAL_TABLET | Freq: Every day | ORAL | 0 refills | Status: DC
Start: 2024-01-30 — End: 2024-02-06
  Filled 2024-01-30: qty 20, 10d supply, fill #0

## 2024-01-30 MED ORDER — SODIUM CHLORIDE 0.9% FLUSH
10.0000 mL | INTRAVENOUS | Status: DC | PRN
  Administered 2024-01-30: 10 mL

## 2024-01-30 MED ORDER — SODIUM CHLORIDE 0.9 % IV SOLN
400.0000 mg | Freq: Once | INTRAVENOUS | Status: AC
  Administered 2024-01-30: 400 mg via INTRAVENOUS
  Filled 2024-01-30: qty 40

## 2024-01-30 MED ORDER — HEPARIN SOD (PORK) LOCK FLUSH 100 UNIT/ML IV SOLN
500.0000 [IU] | Freq: Once | INTRAVENOUS | Status: AC | PRN
Start: 2024-01-30 — End: 2024-01-30
  Administered 2024-01-30: 500 [IU]

## 2024-01-30 MED ORDER — FAMOTIDINE IN NACL 20-0.9 MG/50ML-% IV SOLN
20.0000 mg | Freq: Once | INTRAVENOUS | Status: AC
  Administered 2024-01-30: 20 mg via INTRAVENOUS
  Filled 2024-01-30: qty 50

## 2024-01-30 MED ORDER — SODIUM CHLORIDE 0.9 % IV SOLN
150.0000 mg | Freq: Once | INTRAVENOUS | Status: AC
  Administered 2024-01-30: 150 mg via INTRAVENOUS
  Filled 2024-01-30: qty 150

## 2024-01-30 MED ORDER — SODIUM CHLORIDE 0.9 % IV SOLN
68.0000 mg/m2 | Freq: Once | INTRAVENOUS | Status: AC
  Administered 2024-01-30: 114 mg via INTRAVENOUS
  Filled 2024-01-30: qty 19

## 2024-01-30 NOTE — Progress Notes (Signed)
 Per Lacie Burton NP OK to proceed today with tx w/ ANC 1.2 K/uL.

## 2024-01-30 NOTE — Progress Notes (Signed)
 Decrease taxol  by 15% today per Lacie, NP

## 2024-01-30 NOTE — Progress Notes (Signed)
 OK to use today's wt for Cyramza  dose per Lacie Burton, NP.  Jesus Haynes, Pharm.D., CPP 01/30/2024@11 :32 AM

## 2024-01-31 ENCOUNTER — Other Ambulatory Visit: Payer: Self-pay | Admitting: Nurse Practitioner

## 2024-01-31 ENCOUNTER — Other Ambulatory Visit (HOSPITAL_COMMUNITY): Payer: Self-pay

## 2024-01-31 ENCOUNTER — Other Ambulatory Visit: Payer: Self-pay | Admitting: Oncology

## 2024-01-31 DIAGNOSIS — C160 Malignant neoplasm of cardia: Secondary | ICD-10-CM

## 2024-01-31 DIAGNOSIS — J4520 Mild intermittent asthma, uncomplicated: Secondary | ICD-10-CM

## 2024-01-31 DIAGNOSIS — Z515 Encounter for palliative care: Secondary | ICD-10-CM

## 2024-01-31 MED ORDER — ALBUTEROL SULFATE HFA 108 (90 BASE) MCG/ACT IN AERS
1.0000 | INHALATION_SPRAY | RESPIRATORY_TRACT | 3 refills | Status: AC | PRN
  Filled 2024-02-10: qty 18, 17d supply, fill #0

## 2024-01-31 MED ORDER — METOCLOPRAMIDE HCL 10 MG PO TABS
10.0000 mg | ORAL_TABLET | Freq: Three times a day (TID) | ORAL | 3 refills | Status: DC
  Filled 2024-02-03: qty 120, 30d supply, fill #0

## 2024-02-02 ENCOUNTER — Other Ambulatory Visit: Payer: Self-pay

## 2024-02-02 ENCOUNTER — Emergency Department (HOSPITAL_COMMUNITY)
Admission: EM | Admit: 2024-02-02 | Discharge: 2024-02-03 | Disposition: A | Discharge status: Home / Self Care | Attending: Emergency Medicine | Admitting: Emergency Medicine

## 2024-02-02 ENCOUNTER — Emergency Department (HOSPITAL_COMMUNITY)

## 2024-02-02 ENCOUNTER — Encounter (HOSPITAL_COMMUNITY): Payer: Self-pay

## 2024-02-02 DIAGNOSIS — G8929 Other chronic pain: Secondary | ICD-10-CM | POA: Diagnosis not present

## 2024-02-02 DIAGNOSIS — E876 Hypokalemia: Secondary | ICD-10-CM | POA: Diagnosis not present

## 2024-02-02 DIAGNOSIS — R109 Unspecified abdominal pain: Secondary | ICD-10-CM | POA: Insufficient documentation

## 2024-02-02 DIAGNOSIS — E860 Dehydration: Secondary | ICD-10-CM | POA: Diagnosis not present

## 2024-02-02 DIAGNOSIS — R110 Nausea: Secondary | ICD-10-CM

## 2024-02-02 DIAGNOSIS — J690 Pneumonitis due to inhalation of food and vomit: Secondary | ICD-10-CM | POA: Diagnosis not present

## 2024-02-02 DIAGNOSIS — T7840XA Allergy, unspecified, initial encounter: Secondary | ICD-10-CM | POA: Insufficient documentation

## 2024-02-02 LAB — COMPREHENSIVE METABOLIC PANEL WITH GFR
ALT: 18 U/L (ref 0–44)
AST: 25 U/L (ref 15–41)
Albumin: 3.4 g/dL — ABNORMAL LOW (ref 3.5–5.0)
Alkaline Phosphatase: 46 U/L (ref 38–126)
Anion gap: 16 — ABNORMAL HIGH (ref 5–15)
BUN: 13 mg/dL (ref 6–20)
CO2: 21 mmol/L — ABNORMAL LOW (ref 22–32)
Calcium: 8.5 mg/dL — ABNORMAL LOW (ref 8.9–10.3)
Chloride: 96 mmol/L — ABNORMAL LOW (ref 98–111)
Creatinine, Ser: 0.56 mg/dL — ABNORMAL LOW (ref 0.61–1.24)
GFR, Estimated: >60 mL/min (ref >60)
Glucose, Bld: 122 mg/dL — ABNORMAL HIGH (ref 70–99)
Potassium: 3 mmol/L — ABNORMAL LOW (ref 3.5–5.1)
Sodium: 133 mmol/L — ABNORMAL LOW (ref 135–145)
Total Bilirubin: 1.1 mg/dL (ref 0.0–1.2)
Total Protein: 6 g/dL — ABNORMAL LOW (ref 6.5–8.1)

## 2024-02-02 LAB — CBC WITH DIFFERENTIAL/PLATELET
Abs Immature Granulocytes: 0.02 K/uL (ref 0.00–0.07)
Basophils Absolute: 0 K/uL (ref 0.0–0.1)
Basophils Relative: 0 %
Eosinophils Absolute: 0.1 K/uL (ref 0.0–0.5)
Eosinophils Relative: 2 %
HCT: 37.9 % — ABNORMAL LOW (ref 39.0–52.0)
Hemoglobin: 12.1 g/dL — ABNORMAL LOW (ref 13.0–17.0)
Immature Granulocytes: 0 %
Lymphocytes Relative: 22 %
Lymphs Abs: 1.2 K/uL (ref 0.7–4.0)
MCH: 26.6 pg (ref 26.0–34.0)
MCHC: 31.9 g/dL (ref 30.0–36.0)
MCV: 83.3 fL (ref 80.0–100.0)
Monocytes Absolute: 0.2 K/uL (ref 0.1–1.0)
Monocytes Relative: 4 %
Neutro Abs: 4.2 K/uL (ref 1.7–7.7)
Neutrophils Relative %: 72 %
Platelets: 207 K/uL (ref 150–400)
RBC: 4.55 MIL/uL (ref 4.22–5.81)
RDW: 15.1 % (ref 11.5–15.5)
WBC: 5.8 K/uL (ref 4.0–10.5)
nRBC: 0 % (ref 0.0–0.2)

## 2024-02-02 LAB — MAGNESIUM: Magnesium: 2.2 mg/dL (ref 1.7–2.4)

## 2024-02-02 LAB — TROPONIN I (HIGH SENSITIVITY): Troponin I (High Sensitivity): 5 ng/L (ref <18)

## 2024-02-02 LAB — D-DIMER, QUANTITATIVE: D-Dimer, Quant: 1.1 ug{FEU}/mL — ABNORMAL HIGH (ref 0.00–0.50)

## 2024-02-02 LAB — LIPASE, BLOOD: Lipase: 20 U/L (ref 11–51)

## 2024-02-02 MED ORDER — EPINEPHRINE 0.3 MG/0.3ML IJ SOAJ
0.3000 mg | Freq: Once | INTRAMUSCULAR | Status: AC
Start: 2024-02-02 — End: 2024-02-02
  Administered 2024-02-02: 0.3 mg via INTRAMUSCULAR
  Filled 2024-02-02: qty 0.3

## 2024-02-02 MED ORDER — HYDROMORPHONE HCL 1 MG/ML IJ SOLN
0.5000 mg | Freq: Once | INTRAMUSCULAR | Status: AC
  Administered 2024-02-02: 0.5 mg via INTRAVENOUS
  Filled 2024-02-02: qty 1

## 2024-02-02 MED ORDER — ALBUTEROL SULFATE (2.5 MG/3ML) 0.083% IN NEBU
2.5000 mg | INHALATION_SOLUTION | Freq: Once | RESPIRATORY_TRACT | Status: AC
  Administered 2024-02-02: 2.5 mg via RESPIRATORY_TRACT
  Filled 2024-02-02: qty 3

## 2024-02-02 MED ORDER — FAMOTIDINE IN NACL 20-0.9 MG/50ML-% IV SOLN
20.0000 mg | Freq: Once | INTRAVENOUS | Status: AC
  Administered 2024-02-02: 20 mg via INTRAVENOUS
  Filled 2024-02-02: qty 50

## 2024-02-02 MED ORDER — SODIUM CHLORIDE 0.9 % IV BOLUS
1000.0000 mL | Freq: Once | INTRAVENOUS | Status: AC
  Administered 2024-02-03: 1000 mL via INTRAVENOUS

## 2024-02-02 MED ORDER — POTASSIUM CHLORIDE 10 MEQ/100ML IV SOLN
10.0000 meq | INTRAVENOUS | Status: AC
  Administered 2024-02-03 (×2): 10 meq via INTRAVENOUS
  Filled 2024-02-02 (×2): qty 100

## 2024-02-02 MED ORDER — METHYLPREDNISOLONE SODIUM SUCC 125 MG IJ SOLR
125.0000 mg | Freq: Once | INTRAMUSCULAR | Status: AC
  Administered 2024-02-02: 125 mg via INTRAVENOUS
  Filled 2024-02-02: qty 2

## 2024-02-02 MED ORDER — DIPHENHYDRAMINE HCL 50 MG/ML IJ SOLN
25.0000 mg | Freq: Once | INTRAMUSCULAR | Status: AC
  Administered 2024-02-02: 25 mg via INTRAVENOUS
  Filled 2024-02-02: qty 1

## 2024-02-02 NOTE — ED Triage Notes (Signed)
 Complaining of shortness of breath that started after eating crab legs tonight. Feels like he is feverish, cold chills.

## 2024-02-02 NOTE — ED Provider Notes (Signed)
 [place] EMERGENCY DEPARTMENT AT [facility] Provider Note   CSN: [id number] Arrival date & time: 02/02/24  2153    Patient presents with: Shortness of Breath   Jesus Haynes is a 48 y.o. male here for evaluation of CP, SOB, nausea and emesis.  States symptoms started abruptly after eating shellfish.  He is seeing shellfish multiple times without any difficulties.  Family does note he has metastatic cancer and typically only eat small meals.  He did eat a large meal of his shellfish and he is unsure if his symptoms were from that.  He states he hurts all over.  He took his home Dilaudid  without relief.  Denies sensation of foreign body to throat however feels short of breath, chest tightness, nauseous.  Some pruritus without urticaria.  No history of anaphylaxis.  He was feeling otherwise well prior to symptoms.  No pain or swelling to lower legs.  No bloody emesis or melanotic stool.  No fever at home.  He feels like he is wheezing and is requesting his home albuterol  treatment as he has a history of asthma.  {Add pertinent medical, surgical, social history, OB history to HPI:32947} HPI     Prior to Admission medications   Medication Sig Start Date End Date Taking? Authorizing Provider  albuterol  (PROVENTIL ) (2.5 MG/3ML) 0.083% nebulizer solution Take 3 mLs (2.5 mg total) by nebulization every 6 (six) hours as needed for wheezing or shortness of breath. 08/07/23   Pickenpack-Cousar, Athena N, NP  albuterol  (VENTOLIN  HFA) 108 (90 Base) MCG/ACT inhaler Inhale 1-2 puffs into the lungs every 4 (four) hours as needed for wheezing or shortness of breath. 01/31/24   Pasam, Chinita, MD  Albuterol  Sulfate 2.5 MG/0.5ML NEBU Use 1 vial in nebulizer as directed every 6 hours as needed for wheezing or shortness of breath 04/28/23   Vicky Charleston, PA-C  cyanocobalamin  1000 MCG tablet Take 1 tablet (1,000 mcg total) by mouth daily. 10/14/23   Pickenpack-Cousar, Athena N, NP  dexamethasone   (DECADRON ) 4 MG tablet Take 2 tablets (8 mg total) by mouth daily. For 3 days after chemo 01/30/24   Burton, Lacie K, NP  dronabinol  (MARINOL ) 5 MG capsule Take 1 capsule (5 mg total) by mouth 2 (two) times daily before lunch and supper. 10/02/23   Pickenpack-Cousar, Athena N, NP  folic acid  (FOLVITE ) 1 MG tablet Take 1 tablet (1 mg total) by mouth daily. 10/14/23   Pickenpack-Cousar, Athena N, NP  furosemide  (LASIX ) 20 MG tablet Take 1 tablet (20 mg total) by mouth daily as needed for fluid or edema. 08/07/23   Pasam, Chinita, MD  HYDROmorphone  (DILAUDID ) 2 MG tablet Take 0.5 tablets (1 mg total) by mouth every 8 (eight) hours as needed for severe pain (pain score 7-10). 01/27/24   Pickenpack-Cousar, Fannie SAILOR, NP  methadone  (DOLOPHINE ) 5 MG tablet Take 1 tablet (5 mg total) by mouth daily. 01/02/24   Pickenpack-Cousar, Athena N, NP  metoCLOPramide  (REGLAN ) 10 MG tablet Take 1 tablet (10 mg total) by mouth 4 (four) times daily  before meals and at bedtime. 01/31/24   Pickenpack-Cousar, Athena N, NP  mirtazapine  (REMERON ) 7.5 MG tablet Take 1 tablet (7.5 mg total) by mouth at bedtime. 01/29/24   Pickenpack-Cousar, Athena N, NP  ondansetron  (ZOFRAN -ODT) 8 MG disintegrating tablet Dissolve 1 tablet (8 mg total) by mouth every 8 (eight) hours as needed for nausea or vomiting. 10/02/23   Pasam, Chinita, MD  pantoprazole  (PROTONIX ) 40 MG tablet Take 1 tablet (40 mg  total) by mouth 2 (two) times daily. 01/02/24 03/02/24  [name], NP  polyethylene glycol powder (GLYCOLAX /MIRALAX ) 17 GM/SCOOP powder Dissolve 17 g in 4 oz of liquid and take by mouth daily. 07/10/23   Krishnan, Gokul, MD  potassium chloride  SA (KLOR-CON  M) 20 MEQ tablet Take 1 tablet (20 mEq total) by mouth 2 (two) times daily. 10/31/23   Pickenpack-Cousar, Athena N, NP  prochlorperazine  (COMPAZINE ) 10 MG tablet Take 1 tablet (10 mg total) by mouth every 6 (six) hours as needed for nausea or vomiting. 01/02/24   Pasam, Chinita, MD    Allergies: Patient has no  known allergies.    Review of Systems  Constitutional:  Positive for activity change and appetite change.  HENT: Negative.    Respiratory:  Positive for shortness of breath.   Cardiovascular: Negative.   Gastrointestinal:  Positive for abdominal pain and nausea. Negative for abdominal distention, anal bleeding, blood in stool, constipation, diarrhea, rectal pain and vomiting.  Musculoskeletal:  Positive for myalgias.  Skin: Negative.   Neurological: Negative.   All other systems reviewed and are negative.   Updated Vital Signs BP 129/86   Pulse 68   Temp (!) 97.4 F (36.3 C) (Oral)   Resp 18   Ht 5' 6 (1.676 m)   Wt 52.6 kg   SpO2 100%   BMI 18.72 kg/m   Physical Exam Vitals and nursing note reviewed.  Constitutional:      General: He is not in acute distress.    Appearance: He is well-developed. He is ill-appearing (chronically ill appearing). He is not toxic-appearing or diaphoretic.  HENT:     Head: Atraumatic.     Mouth/Throat:     Mouth: Mucous membranes are moist.  Eyes:     Pupils: Pupils are equal, round, and reactive to light.  Cardiovascular:     Rate and Rhythm: Normal rate and regular rhythm.     Pulses: Normal pulses.     Heart sounds: Normal heart sounds.  Pulmonary:     Effort: Pulmonary effort is normal. No respiratory distress.     Breath sounds: Wheezing present.  Chest:     Comments: Nontender chest wall Abdominal:     General: Bowel sounds are normal. There is no distension.     Palpations: Abdomen is soft.     Comments: Soft, nontender, no rebound or guarding.  Prior surgical site C/D/I  Musculoskeletal:        General: Normal range of motion.     Cervical back: Normal range of motion and neck supple.     Right lower leg: No tenderness. No edema.     Left lower leg: No tenderness. No edema.     Comments: No bony tenderness, compartments soft, full range of motion  Skin:    General: Skin is warm and dry.     Capillary Refill: Capillary  refill takes less than 2 seconds.     Comments: Some mild skin excoriations without obvious urticaria  Neurological:     General: No focal deficit present.     Mental Status: He is alert and oriented to person, place, and time.     Motor: No weakness.     (all labs ordered are listed, but only abnormal results are displayed) Labs Reviewed  CBC WITH DIFFERENTIAL/PLATELET - Abnormal; Notable for the following components:      Result Value   Hemoglobin 12.1 (*)    HCT 37.9 (*)    All other components within  normal limits  COMPREHENSIVE METABOLIC PANEL WITH GFR - Abnormal; Notable for the following components:   Sodium 133 (*)    Potassium 3.0 (*)    Chloride 96 (*)    CO2 21 (*)    Glucose, Bld 122 (*)    Creatinine, Ser 0.56 (*)    Calcium  8.5 (*)    Total Protein 6.0 (*)    Albumin 3.4 (*)    Anion gap 16 (*)    All other components within normal limits  D-DIMER, QUANTITATIVE - Abnormal; Notable for the following components:   D-Dimer, Quant 1.10 (*)    All other components within normal limits  LIPASE, BLOOD  MAGNESIUM   URINALYSIS, W/ REFLEX TO CULTURE (INFECTION SUSPECTED)  TROPONIN I (HIGH SENSITIVITY)    EKG: EKG Interpretation Date/Time:  Sunday February 02 2024 22:40:24 EDT Ventricular Rate:  81 PR Interval:  119 QRS Duration:  92 QT Interval:  541 QTC Calculation: 629 R Axis:   75  Text Interpretation: Sinus rhythm Baseline wander ST-t wave abnormality Confirmed by Garrick Charleston [phone]) on 02/02/2024 10:43:33 PM  Radiology: DG Chest Portable 1 View Result Date: 02/02/2024 CLINICAL DATA:  Shortness of breath. EXAM: PORTABLE CHEST 1 VIEW COMPARISON:  October 03, 2023 FINDINGS: There is stable right-sided venous Port-A-Cath positioning. The heart size and mediastinal contours are within normal limits. Both lungs are clear. The visualized skeletal structures are unremarkable. IMPRESSION: No active disease. Electronically Signed   By: Suzen Dials M.D.   On:  02/02/2024 22:37    {Document cardiac monitor, telemetry assessment procedure when appropriate:32947} Procedures   Medications Ordered in the ED  sodium chloride  0.9 % bolus 1,000 mL (has no administration in time range)  potassium chloride  10 mEq in 100 mL IVPB (has no administration in time range)  diphenhydrAMINE  (BENADRYL ) injection 25 mg (25 mg Intravenous Given 02/02/24 2253)  methylPREDNISolone  sodium succinate (SOLU-MEDROL ) 125 mg/2 mL injection 125 mg (125 mg Intravenous Given 02/02/24 2258)  famotidine  (PEPCID ) IVPB 20 mg premix (0 mg Intravenous Stopped 02/02/24 2325)  HYDROmorphone  (DILAUDID ) injection 0.5 mg (0.5 mg Intravenous Given 02/02/24 2252)  EPINEPHrine  (EPI-PEN) injection 0.3 mg (0.3 mg Intramuscular Given 02/02/24 2233)  albuterol  (PROVENTIL ) (2.5 MG/3ML) 0.083% nebulizer solution 2.5 mg (2.5 mg Nebulization Given 02/02/24 5479)   48 year old very complex medical history here for evaluation of nausea, pruritus, shortness of breath, chest tightness after eating shellfish earlier tonight.  No history of anaphylaxis.  He has some excoriations from itching however no obvious urticaria.  He has some mild wheeze history of asthma.  He does note that he ate a large meal prior to start of symptoms which he does not typically do.           {Click here for ABCD2, HEART and other calculators REFRESH Note before signing:1}                              Medical Decision Making Amount and/or Complexity of Data Reviewed Labs: ordered. Radiology: ordered.  Risk Prescription drug management.    {Document critical care time when appropriate  Document review of labs and clinical decision tools ie CHADS2VASC2, etc  Document your independent review of radiology images and any outside records  Document your discussion with family members, caretakers and with consultants  Document social determinants of health affecting pt's care  Document your decision making why or why not admission,  treatments were needed:32947:::1}   Final diagnoses:  None    ED Discharge Orders     None

## 2024-02-03 ENCOUNTER — Other Ambulatory Visit (HOSPITAL_BASED_OUTPATIENT_CLINIC_OR_DEPARTMENT_OTHER): Payer: Self-pay

## 2024-02-03 ENCOUNTER — Emergency Department (HOSPITAL_COMMUNITY)

## 2024-02-03 ENCOUNTER — Other Ambulatory Visit (HOSPITAL_COMMUNITY): Payer: Self-pay

## 2024-02-03 ENCOUNTER — Other Ambulatory Visit: Payer: Self-pay

## 2024-02-03 LAB — TROPONIN I (HIGH SENSITIVITY): Troponin I (High Sensitivity): 4 ng/L (ref <18)

## 2024-02-03 MED ORDER — IOHEXOL 350 MG/ML SOLN
100.0000 mL | Freq: Once | INTRAVENOUS | Status: AC | PRN
  Administered 2024-02-03: 100 mL via INTRAVENOUS

## 2024-02-03 MED ORDER — AMOXICILLIN-POT CLAVULANATE 875-125 MG PO TABS
1.0000 | ORAL_TABLET | Freq: Two times a day (BID) | ORAL | 0 refills | Status: DC
  Filled 2024-02-03: qty 14, 7d supply, fill #0

## 2024-02-03 MED ORDER — HEPARIN SOD (PORK) LOCK FLUSH 100 UNIT/ML IV SOLN
500.0000 [IU] | Freq: Once | INTRAVENOUS | Status: AC
  Administered 2024-02-03: 500 [IU]
  Filled 2024-02-03: qty 5

## 2024-02-03 NOTE — ED Notes (Signed)
 Port de accessed. Patient up and walking to the restroom. Patient stated I feel better

## 2024-02-03 NOTE — Discharge Instructions (Addendum)
 It was a pleasure taking care of Jesus Haynes today.  CT scan showed chronic findings.  Due to his nausea vomiting is possible that he could have aspirated or got some of the products into his lungs.  I have written for attic in case.  Keep close follow-up with oncology  Return for new or worsening symptoms

## 2024-02-04 ENCOUNTER — Ambulatory Visit: Payer: Self-pay

## 2024-02-04 ENCOUNTER — Inpatient Hospital Stay (HOSPITAL_COMMUNITY)
Admission: EM | Admit: 2024-02-04 | Discharge: 2024-02-06 | DRG: 947 | Disposition: A | Discharge status: Home / Self Care | Attending: Internal Medicine | Admitting: Internal Medicine

## 2024-02-04 ENCOUNTER — Other Ambulatory Visit: Payer: Self-pay

## 2024-02-04 ENCOUNTER — Encounter (HOSPITAL_COMMUNITY): Payer: Self-pay

## 2024-02-04 ENCOUNTER — Telehealth: Payer: Self-pay

## 2024-02-04 DIAGNOSIS — G893 Neoplasm related pain (acute) (chronic): Principal | ICD-10-CM | POA: Diagnosis present

## 2024-02-04 DIAGNOSIS — C160 Malignant neoplasm of cardia: Secondary | ICD-10-CM | POA: Diagnosis present

## 2024-02-04 DIAGNOSIS — E440 Moderate protein-calorie malnutrition: Secondary | ICD-10-CM | POA: Diagnosis present

## 2024-02-04 DIAGNOSIS — Z87891 Personal history of nicotine dependence: Secondary | ICD-10-CM

## 2024-02-04 DIAGNOSIS — C786 Secondary malignant neoplasm of retroperitoneum and peritoneum: Secondary | ICD-10-CM | POA: Diagnosis present

## 2024-02-04 DIAGNOSIS — E43 Unspecified severe protein-calorie malnutrition: Secondary | ICD-10-CM | POA: Insufficient documentation

## 2024-02-04 DIAGNOSIS — G629 Polyneuropathy, unspecified: Secondary | ICD-10-CM | POA: Diagnosis not present

## 2024-02-04 DIAGNOSIS — Z720 Tobacco use: Secondary | ICD-10-CM | POA: Diagnosis present

## 2024-02-04 DIAGNOSIS — R52 Pain, unspecified: Secondary | ICD-10-CM | POA: Diagnosis present

## 2024-02-04 DIAGNOSIS — Z681 Body mass index (BMI) 19 or less, adult: Secondary | ICD-10-CM

## 2024-02-04 DIAGNOSIS — T451X5A Adverse effect of antineoplastic and immunosuppressive drugs, initial encounter: Secondary | ICD-10-CM | POA: Diagnosis present

## 2024-02-04 DIAGNOSIS — D61810 Antineoplastic chemotherapy induced pancytopenia: Secondary | ICD-10-CM | POA: Diagnosis present

## 2024-02-04 DIAGNOSIS — D61818 Other pancytopenia: Secondary | ICD-10-CM | POA: Diagnosis present

## 2024-02-04 DIAGNOSIS — Z8249 Family history of ischemic heart disease and other diseases of the circulatory system: Secondary | ICD-10-CM

## 2024-02-04 DIAGNOSIS — J45909 Unspecified asthma, uncomplicated: Secondary | ICD-10-CM | POA: Diagnosis present

## 2024-02-04 DIAGNOSIS — E876 Hypokalemia: Secondary | ICD-10-CM | POA: Diagnosis present

## 2024-02-04 DIAGNOSIS — G620 Drug-induced polyneuropathy: Secondary | ICD-10-CM | POA: Diagnosis present

## 2024-02-04 DIAGNOSIS — D500 Iron deficiency anemia secondary to blood loss (chronic): Secondary | ICD-10-CM | POA: Diagnosis present

## 2024-02-04 DIAGNOSIS — R202 Paresthesia of skin: Principal | ICD-10-CM

## 2024-02-04 DIAGNOSIS — R64 Cachexia: Secondary | ICD-10-CM | POA: Diagnosis present

## 2024-02-04 DIAGNOSIS — Z515 Encounter for palliative care: Secondary | ICD-10-CM

## 2024-02-04 DIAGNOSIS — Z66 Do not resuscitate: Secondary | ICD-10-CM | POA: Diagnosis present

## 2024-02-04 DIAGNOSIS — Z79899 Other long term (current) drug therapy: Secondary | ICD-10-CM

## 2024-02-04 LAB — CBC
HCT: 34.6 % — ABNORMAL LOW (ref 39.0–52.0)
Hemoglobin: 11.1 g/dL — ABNORMAL LOW (ref 13.0–17.0)
MCH: 26.9 pg (ref 26.0–34.0)
MCHC: 32.1 g/dL (ref 30.0–36.0)
MCV: 83.8 fL (ref 80.0–100.0)
Platelets: 144 K/uL — ABNORMAL LOW (ref 150–400)
RBC: 4.13 MIL/uL — ABNORMAL LOW (ref 4.22–5.81)
RDW: 15.4 % (ref 11.5–15.5)
WBC: 2.7 K/uL — ABNORMAL LOW (ref 4.0–10.5)
nRBC: 0 % (ref 0.0–0.2)

## 2024-02-04 LAB — MAGNESIUM: Magnesium: 2.1 mg/dL (ref 1.7–2.4)

## 2024-02-04 LAB — BASIC METABOLIC PANEL WITH GFR
Anion gap: 9 (ref 5–15)
BUN: 9 mg/dL (ref 6–20)
CO2: 24 mmol/L (ref 22–32)
Calcium: 7.8 mg/dL — ABNORMAL LOW (ref 8.9–10.3)
Chloride: 101 mmol/L (ref 98–111)
Creatinine, Ser: 0.42 mg/dL — ABNORMAL LOW (ref 0.61–1.24)
GFR, Estimated: >60 mL/min (ref >60)
Glucose, Bld: 84 mg/dL (ref 70–99)
Potassium: 2.8 mmol/L — ABNORMAL LOW (ref 3.5–5.1)
Sodium: 134 mmol/L — ABNORMAL LOW (ref 135–145)

## 2024-02-04 LAB — PHOSPHORUS: Phosphorus: 1.6 mg/dL — ABNORMAL LOW (ref 2.5–4.6)

## 2024-02-04 MED ORDER — DIPHENHYDRAMINE HCL 50 MG/ML IJ SOLN
12.5000 mg | Freq: Once | INTRAMUSCULAR | Status: AC
  Administered 2024-02-04: 12.5 mg via INTRAVENOUS
  Filled 2024-02-04: qty 1

## 2024-02-04 MED ORDER — POTASSIUM CHLORIDE ER 10 MEQ PO TBCR
10.0000 meq | EXTENDED_RELEASE_TABLET | Freq: Every day | ORAL | 0 refills | Status: DC
  Filled 2024-02-04: qty 20, 20d supply, fill #0

## 2024-02-04 MED ORDER — GABAPENTIN 300 MG PO CAPS
300.0000 mg | ORAL_CAPSULE | Freq: Three times a day (TID) | ORAL | 0 refills | Status: DC
  Filled 2024-02-04: qty 30, 10d supply, fill #0

## 2024-02-04 MED ORDER — POTASSIUM PHOSPHATES 15 MMOLE/5ML IV SOLN
15.0000 mmol | Freq: Once | INTRAVENOUS | Status: AC
  Administered 2024-02-05: 15 mmol via INTRAVENOUS
  Filled 2024-02-04: qty 5

## 2024-02-04 MED ORDER — POTASSIUM CHLORIDE CRYS ER 20 MEQ PO TBCR
40.0000 meq | EXTENDED_RELEASE_TABLET | Freq: Once | ORAL | Status: AC
  Administered 2024-02-04: 40 meq via ORAL
  Filled 2024-02-04: qty 2

## 2024-02-04 MED ORDER — POTASSIUM CHLORIDE 10 MEQ/100ML IV SOLN
10.0000 meq | INTRAVENOUS | Status: AC
  Administered 2024-02-04 (×2): 10 meq via INTRAVENOUS
  Filled 2024-02-04 (×2): qty 100

## 2024-02-04 MED ORDER — CALCIUM GLUCONATE-NACL 1-0.675 GM/50ML-% IV SOLN
1.0000 g | Freq: Once | INTRAVENOUS | Status: AC
  Administered 2024-02-04: 1000 mg via INTRAVENOUS
  Filled 2024-02-04: qty 50

## 2024-02-04 MED ORDER — HYDROMORPHONE HCL 1 MG/ML IJ SOLN
0.5000 mg | Freq: Once | INTRAMUSCULAR | Status: AC
  Administered 2024-02-04: 0.5 mg via INTRAVENOUS
  Filled 2024-02-04: qty 1

## 2024-02-04 MED ORDER — THIAMINE HCL 100 MG/ML IJ SOLN
100.0000 mg | Freq: Every day | INTRAMUSCULAR | Status: DC
  Administered 2024-02-04 – 2024-02-05 (×2): 100 mg via INTRAVENOUS
  Filled 2024-02-04 (×2): qty 2

## 2024-02-04 MED ORDER — GABAPENTIN 300 MG PO CAPS
300.0000 mg | ORAL_CAPSULE | Freq: Once | ORAL | Status: AC
  Administered 2024-02-04: 300 mg via ORAL
  Filled 2024-02-04: qty 1

## 2024-02-04 MED ORDER — GABAPENTIN 300 MG PO CAPS: 300.0000 mg | ORAL_CAPSULE | Freq: Every day | ORAL | Status: DC

## 2024-02-04 MED ORDER — HYDROMORPHONE HCL 1 MG/ML IJ SOLN
1.0000 mg | Freq: Once | INTRAMUSCULAR | Status: AC
  Administered 2024-02-04: 1 mg via INTRAVENOUS
  Filled 2024-02-04: qty 1

## 2024-02-04 NOTE — Assessment & Plan Note (Signed)
 Chronic stable continue albuterol as needed

## 2024-02-04 NOTE — Assessment & Plan Note (Signed)
 Transfuse as needed for hemoglobin below   7

## 2024-02-04 NOTE — Telephone Encounter (Signed)
 FYI Only or Action Required?: FYI only for provider.  Patient was last seen in primary care on 07/29/2023 by [name], NP.  Called Nurse Triage reporting Numbness.  Symptoms began several days ago.  Interventions attempted: Rest, hydration, or home remedies.  Symptoms are: gradually worsening.  Triage Disposition: Go to ED Now (or PCP Triage)  Patient/caregiver understands and will follow disposition?: Yes   **Referred to the ED, see note below**                                Copied from CRM #[id number]. Topic: Clinical - Red Word Triage >> Feb 04, 2024  2:11 PM Montie POUR wrote: Red Word that prompted transfer to Nurse Triage:  He just had numbness and tingling all over his body; more so in arms and legs; pain was like needles going in him; Pain was at a 10. He took dilaudid  and it went away. Reason for Disposition  Patient sounds very sick or weak to the triager  Answer Assessment - Initial Assessment Questions 1. SYMPTOM: What is the main symptom you are concerned about? (e.g., weakness, numbness)     Numbness all over body, tingling. Pins and needles feeling.  2. ONSET: When did this start? (e.g., minutes, hours, days; while sleeping)     Last Sunday   3. LAST NORMAL: When was the last time you (the patient) were normal (no symptoms)?     Today at 11:00 am he was normal, as far as numbness feeling, however he has complaints of multiple symptoms.   4. PATTERN Does this come and go, or has it been constant since it started?  Is it present now?     Intermittent   5. CARDIAC SYMPTOMS: Have you had any of the following symptoms: chest pain, difficulty breathing, palpitations?     Chest pain due to lung cancer  6. NEUROLOGIC SYMPTOMS: Have you had any of the following symptoms: headache, dizziness, vision loss, double vision, changes in speech, unsteady on your feet?      Dizziness patient states the dizziness is  Moderate-Severe  7. OTHER SYMPTOMS: Do you have any other symptoms? No    Patient was seen in ED on 8/3; no significant findings for the Numbness/Tingling, as patient stated they did not address the concerns fully.  Patient reached out to oncologist office and was advised to be seen in the ED, I agree based upon symptoms, and referred him back to the ED. He agrees with plan of care.  Protocols used: Neurologic Deficit-A-AH

## 2024-02-04 NOTE — Assessment & Plan Note (Signed)
 Will replace

## 2024-02-04 NOTE — Transitions of Care (Post Inpatient/ED Visit) (Signed)
   02/04/2024  Name: Jesus Haynes MRN: [id number] DOB: 01/05/1976  Today's TOC FU Call Status: Today's TOC FU Call Status:: Unsuccessful Call (1st Attempt) Unsuccessful Call (1st Attempt) Date: 02/04/24  Attempted to reach the patient regarding the most recent Inpatient/ED visit.  Follow Up Plan: Additional outreach attempts will be made to reach the patient to complete the Transitions of Care (Post Inpatient/ED visit) call.   Signature  [employer], [place]

## 2024-02-04 NOTE — Assessment & Plan Note (Signed)
-   Spoke about importance of quitting spent 5 minutes discussing options for treatment, prior attempts at quitting, and dangers of smoking  -At this point patient is  NOT  interested in quitting  - refused  nicotine patch   - nursing tobacco cessation protocol  

## 2024-02-04 NOTE — Assessment & Plan Note (Signed)
-   will replace electrolytes and repeat  check Mg, phos and Ca level and replace as needed Monitor on telemetry   Lab Results  Component Value Date   K 2.8 (L) 02/04/2024     Lab Results  Component Value Date   CREATININE 0.42 (L) 02/04/2024   Lab Results  Component Value Date   MG 2.1 02/04/2024   Lab Results  Component Value Date   CALCIUM  7.8 (L) 02/04/2024   PHOS 3.2 08/02/2023

## 2024-02-04 NOTE — H&P (Addendum)
 Jesus Haynes FMW:[id number] DOB: 1976-02-25 DOA: 02/04/2024     PCP: [name], NP   [facility]    Oncology  Dr.FENG    Patient arrived to ER on 02/04/24 at 1521 Referred by Attending Trifan, Donnice PARAS, MD   Patient coming from:    home Lives      With family     Chief Complaint:   Chief Complaint  Patient presents with   Numbness   Tingling    HPI: Jesus Haynes is a 48 y.o. 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 At that time he was evaluated for possible aspiration pneumonia but him and family denies any vomiting they did not endorse any coughing or fevers and did not take any antibiotics that was prescribed. He continues to have pins-and-needles sensation all over his body and comes back. Case was discussed with oncology who recommends starting Neurontin  Also found to have low potassium which supposed to be replaced.  Patient denies any nausea vomiting or diarrhea denies any constipation no fevers or chills     Denies significant ETOH intake   Does  smoke  but interested in quitting       Regarding pertinent Chronic problems:       last echo  Recent Results (from the past 56199 hours)  ECHOCARDIOGRAM COMPLETE   Collection Time: 06/15/23  5:08 PM  Result Value   BP 113/68   S' Lateral 2.60   Area-P 1/2 4.39   Est EF 60 - 65%   Narrative      ECHOCARDIOGRAM REPORT      IMPRESSIONS    1. Left ventricular ejection fraction, by estimation, is 60 to 65%. The left ventricle  has normal function. The left ventricle has no regional wall motion abnormalities. Left ventricular diastolic parameters were normal.  2. Right ventricular systolic function is normal. The right ventricular size is normal.  3. The mitral valve is normal in structure. No evidence of mitral valve regurgitation. No evidence of mitral stenosis.  4. The aortic valve is normal in structure. Aortic valve regurgitation is not visualized. No aortic stenosis is present.  5. The inferior vena cava is normal in size with greater than 50% respiratory variability, suggesting right atrial pressure of 3 mmHg.            Asthma -well   controlled on home inhalers/ nebs                     Chronic anemia - baseline hg Hemoglobin & Hematocrit  Recent Labs    01/29/24 0916 02/02/24 2301 02/04/24 1711  HGB 11.7* 12.1* 11.1*   Iron /TIBC/Ferritin/ %Sat    Component Value Date/Time   IRON  23 (L) 07/01/2023 0513   TIBC 356 07/01/2023 0513   FERRITIN 4 (L) 06/14/2023 1153   IRONPCTSAT 7 (L) 07/01/2023 0513    Cancer: Gastric cancer Symptomatic management  taking zofran , reglan , and compazine   Eating very little Has been having severe abdominal pain Supposed to be following with palliative care  While  in ER: Clinical Course as of 02/04/24 2143  Tue Feb 04, 2024  1741 Dr Lanny oncology suggests gabapentin  300 mg which can help with neuropathy. This may be a chemo medication side effect.  Patient could be managed either in the hospital or outpatient if his pain is improved. [MT]  2031 On reassessment, pt is still quite uncomfortable - we'll admit for pain control and K repletion [MT]    Clinical Course User Index [MT] Cottie Donnice PARAS, MD         Lab Orders         Basic metabolic panel         CBC         Magnesium       02/03/24 CTabd/pelvis -    CTA chest -   no PE, Wall thickening of the esophagus, stomach may be due to infection or inflammation. Infiltrative neoplasm not excluded. Wall  thickening of the colon compatible with infectious or inflammatory pancolitis. Small left pleural effusion and atelectasis in the left lower lobe.  Small pericardial effusion and mild thickening of the pericardium suggesting inflammation or infection. Debris fills the right lower lobe bronchus, suspicious for aspiration. No focal consolidation or pneumothorax. Trace left pleural effusion and atelectasis in the left lower lobe.  Following Medications were ordered in ER: Medications  calcium  gluconate 1 g/ 50 mL sodium chloride  IVPB (has no administration in time range)  HYDROmorphone  (DILAUDID ) injection 1 mg (1 mg Intravenous Given 02/04/24 1728)  diphenhydrAMINE  (BENADRYL ) injection 12.5 mg (12.5 mg Intravenous Given 02/04/24 1728)  gabapentin  (NEURONTIN ) capsule 300 mg (300 mg Oral Given 02/04/24 1822)  potassium chloride  10 mEq in 100 mL IVPB (0 mEq Intravenous Stopped 02/04/24 2045)  potassium chloride  SA (KLOR-CON  M) CR tablet 40 mEq (40 mEq Oral Given 02/04/24 1822)  HYDROmorphone  (DILAUDID ) injection 0.5 mg (0.5 mg Intravenous Given 02/04/24 2044)    _______________________________________________________ ER Provider Called:     Oncology  Dr.Feng They Recommend admit to medicine   Will see in AM  use Neurontin      ED Triage Vitals  Encounter Vitals Group     BP 02/04/24 1536 122/82     Girls Systolic BP Percentile --      Girls Diastolic BP Percentile --      Boys Systolic BP Percentile --      Boys Diastolic BP Percentile --      Pulse Rate 02/04/24 1536 65     Resp 02/04/24 1536 (!) 22     Temp 02/04/24 1536 98 F (36.7 C)     Temp Source 02/04/24 1536 Oral     SpO2 02/04/24 1536 100 %     Weight --      Height --      Head Circumference --      Peak Flow --      Pain Score 02/04/24 1546 6     Pain Loc --      Pain Education --      Exclude from Growth Chart --   UFJK(75)@     _________________________________________ Significant initial  Findings: Abnormal Labs  Reviewed  BASIC METABOLIC PANEL WITH GFR - Abnormal; Notable for the following components:      Result Value   Sodium 134 (*)    Potassium 2.8 (*)    Creatinine, Ser 0.42 (*)    Calcium  7.8 (*)    All other components within normal limits  CBC - Abnormal; Notable for the following components:  WBC 2.7 (*)    RBC 4.13 (*)    Hemoglobin 11.1 (*)    HCT 34.6 (*)    Platelets 144 (*)    All other components within normal limits      _________________________ Troponin   Cardiac Panel (last 3 results) Recent Labs    02/02/24 2301 02/03/24 0144  TROPONINIHS 5 4      BNP (last 3 results) No results for input(s): BNP in the last 8760 hours.    Recent Labs    02/02/24 2301  DDIMER 1.10*        The recent clinical data is shown below. Vitals:   02/04/24 1600 02/04/24 1851 02/04/24 1933 02/04/24 2100  BP: (!) 125/91 (!) 146/101  (!) 138/98  Pulse:  62  66  Resp:  18  18  Temp:   98.5 F (36.9 C)   TempSrc:      SpO2:  98%  97%    WBC     Component Value Date/Time   WBC 2.7 (L) 02/04/2024 1711   LYMPHSABS 1.2 02/02/2024 2301   MONOABS 0.2 02/02/2024 2301   EOSABS 0.1 02/02/2024 2301   BASOSABS 0.0 02/02/2024 2301         Results for orders placed or performed during the hospital encounter of 07/25/20  Group A Strep by PCR     Status: None   Collection Time: 07/25/20 11:32 AM   Specimen: Throat; Sterile Swab  Result Value Ref Range Status   Group A Strep by PCR NOT DETECTED NOT DETECTED Final    Comment: Performed at [facility], [address] [place], [place] [address]    __________________________________________________________ Recent Labs  Lab 01/29/24 [phone] 02/02/24 2301 02/04/24 1711  NA 136 133* 134*  K 3.4* 3.0* 2.8*  CO2 27 21* 24  GLUCOSE 92 122* 84  BUN 11 13 9   CREATININE 0.55* 0.56* 0.42*  CALCIUM  8.9 8.5* 7.8*  MG 1.9 2.2 2.1    Cr   stable,    Lab Results  Component Value Date   CREATININE 0.42 (L) 02/04/2024    CREATININE 0.56 (L) 02/02/2024   CREATININE 0.55 (L) 01/29/2024    Recent Labs  Lab 01/29/24 0916 02/02/24 2301  AST 16 25  ALT 9 18  ALKPHOS 47 46  BILITOT 0.5 1.1  PROT 6.3* 6.0*  ALBUMIN 3.5 3.4*   Lab Results  Component Value Date   CALCIUM  7.8 (L) 02/04/2024   PHOS 3.2 08/02/2023    Plt: Lab Results  Component Value Date   PLT 144 (L) 02/04/2024       Recent Labs  Lab 01/29/24 0916 02/02/24 2301 02/04/24 1711  WBC 2.2* 5.8 2.7*  NEUTROABS 1.2* 4.2  --   HGB 11.7* 12.1* 11.1*  HCT 35.6* 37.9* 34.6*  MCV 81.5 83.3 83.8  PLT 174 207 144*    HG/HCT   stable,      Component Value Date/Time   HGB 11.1 (L) 02/04/2024 1711   HGB 11.7 (L) 01/29/2024 0916   HGB 10.5 (L) 06/12/2023 1413   HCT 34.6 (L) 02/04/2024 1711   HCT 34.2 (L) 06/12/2023 1413   MCV 83.8 02/04/2024 1711   MCV 82 06/12/2023 1413      Recent Labs  Lab 02/02/24 2301  LIPASE 20   No results for input(s): AMMONIA in the last 168 hours.    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     CBG (last 3)  No results for input(s): GLUCAP in the last 72 hours.        Cultures:    Component Value Date/Time   SDES THROAT 07/12/2016 1925   SPECREQUEST NONE Reflexed from [id number] 07/12/2016 1925   CULT RARE STREPTOCOCCUS,BETA HEMOLYTIC NOT GROUP A 07/12/2016 1925   REPTSTATUS 07/15/2016 FINAL 07/12/2016 1925     Radiological Exams on Admission: CT Angio Chest PE W and/or Wo Contrast Result Date:  02/03/2024 EXAM: CTA CHEST PE WITHOUT AND WITH CONTRAST CT ABDOMEN AND PELVIS WITHOUT AND WITH CONTRAST 02/03/2024 12:54:51 AM TECHNIQUE: CTA of the chest was performed after the administration of intravenous contrast. Multiplanar reformatted images are provided for review. MIP images are provided for review. CT of the abdomen and pelvis was performed with the administration of intravenous contrast. Automated exposure control, iterative reconstruction, and/or weight based adjustment of the mA/kV was utilized to reduce the radiation dose to as low as reasonably achievable. COMPARISON: Chest radiograph 02/02/2024 and PET/CT 12/30/2023. CLINICAL HISTORY: Pulmonary embolism (PE) suspected, low to intermediate prob, positive D-dimer; SOB, abd pain, pos ddimer, hx meta CA. FINDINGS: CHEST: PULMONARY ARTERIES: Pulmonary arteries are adequately opacified for evaluation. No intraluminal filling defect to suggest pulmonary embolism. Main pulmonary artery is normal in caliber. MEDIASTINUM: Interval increase in size of a precarinal lymph node measuring 1.6 cm, previously 0.9 cm (series 8 image 159). Similar size of the enlarged subcarinal lymph node measuring 1.4 cm. Right hilar lymph node measuring 1.2 cm on series 8 image 187. Enlarged left hilar lymph node measuring 1.2 cm on series 8 image 161. Small pericardial effusion similar to prior. There is mild thickening of the pericardium suggesting inflammation or infection. LUNGS AND PLEURA: Debris fills the right lower lobe bronchus, suspicious for aspiration. No focal consolidation or pneumothorax. Trace left pleural effusion and atelectasis in the left lower lobe. SOFT TISSUES AND BONES: No acute fracture or destructive osseous lesion in the chest. Chest wall port-a-cath on the right with tip in the right atrium. ABDOMEN AND PELVIS: LIVER: Stable hepatic cysts. GALLBLADDER AND BILE DUCTS: Gallbladder is unremarkable. No biliary ductal dilatation. SPLEEN: Spleen demonstrates no  acute abnormality. PANCREAS: Pancreas demonstrates no acute abnormality. ADRENAL GLANDS: Adrenal glands demonstrate no acute abnormality. KIDNEYS, URETERS AND BLADDER: No stones in the kidneys or ureters. No hydronephrosis. No perinephric or periureteral stranding. Urinary bladder is unremarkable. GI AND BOWEL: Wall thickening of the distal esophagus near the gastroesophageal junction similar to prior. Thickening of the stomach wall with mucosal hyperenhancement. Diffuse colonic wall thickening and mucosal hyperenhancement suggestive of colitis. No bowel obstruction. REPRODUCTIVE: Reproductive organs are unremarkable. PERITONEUM AND RETROPERITONEUM: Small volume abdominal pelvic ascites. No free intraperitoneal air. Similar nodular thickening in the left upper quadrant along the peritoneum which demonstrated FDG avidity on 12/30/2023 (series 4 image 13). LYMPH NODES: Similar left paraaortic lymphadenopathy, for example a 1.5 x 2.0 cm node on series 6 image 86 is not substantially changed from 12/30/2023. BONES AND SOFT TISSUES: No acute abnormality of the visualized bones.  No focal soft tissue abnormality. IMPRESSION: 1. No pulmonary embolism. 2. Wall thickening of the esophagus, stomach may be due to infection or inflammation. Infiltrative neoplasm not excluded. 3. Wall thickening of the colon compatible with infectious or inflammatory pancolitis. 4. Small left pleural effusion and atelectasis in the left lower lobe. 5. Increased size of a precarinal lymph node. Re-demonstrated mediastinal, hilar, and retroperitoneal metastatic lymphadenopathy. 6. Small pericardial effusion and mild thickening of the pericardium suggesting inflammation or infection. Electronically signed by: Norman Gatlin MD 02/03/2024 01:20 AM EDT RP Workstation: [id number]   CT ABDOMEN PELVIS W CONTRAST Result Date: 02/03/2024 EXAM: CTA CHEST PE WITHOUT AND WITH CONTRAST CT ABDOMEN AND PELVIS WITHOUT AND WITH CONTRAST 02/03/2024 12:54:51 AM  TECHNIQUE: CTA of the chest was performed after the administration of intravenous contrast. Multiplanar reformatted images are provided for review. MIP images are provided for review. CT of the abdomen and pelvis was performed with the administration of intravenous contrast. Automated exposure control, iterative reconstruction, and/or weight based adjustment of the mA/kV was utilized to reduce the radiation dose to as low as reasonably achievable. COMPARISON: Chest radiograph 02/02/2024 and PET/CT 12/30/2023. CLINICAL HISTORY: Pulmonary embolism (PE) suspected, low to intermediate prob, positive D-dimer; SOB, abd pain, pos ddimer, hx meta CA. FINDINGS: CHEST: PULMONARY ARTERIES: Pulmonary arteries are adequately opacified for evaluation. No intraluminal filling defect to suggest pulmonary embolism. Main pulmonary artery is normal in caliber. MEDIASTINUM: Interval increase in size of a precarinal lymph node measuring 1.6 cm, previously 0.9 cm (series 8 image 159). Similar size of the enlarged subcarinal lymph node measuring 1.4 cm. Right hilar lymph node measuring 1.2 cm on series 8 image 187. Enlarged left hilar lymph node measuring 1.2 cm on series 8 image 161. Small pericardial effusion similar to prior. There is mild thickening of the pericardium suggesting inflammation or infection. LUNGS AND PLEURA: Debris fills the right lower lobe bronchus, suspicious for aspiration. No focal consolidation or pneumothorax. Trace left pleural effusion and atelectasis in the left lower lobe. SOFT TISSUES AND BONES: No acute fracture or destructive osseous lesion in the chest. Chest wall port-a-cath on the right with tip in the right atrium. ABDOMEN AND PELVIS: LIVER: Stable hepatic cysts. GALLBLADDER AND BILE DUCTS: Gallbladder is unremarkable. No biliary ductal dilatation. SPLEEN: Spleen demonstrates no acute abnormality. PANCREAS: Pancreas demonstrates no acute abnormality. ADRENAL GLANDS: Adrenal glands demonstrate no acute  abnormality. KIDNEYS, URETERS AND BLADDER: No stones in the kidneys or ureters. No hydronephrosis. No perinephric or periureteral stranding. Urinary bladder is unremarkable. GI AND BOWEL: Wall thickening of the distal esophagus near the gastroesophageal junction similar to prior. Thickening of the stomach wall with mucosal hyperenhancement. Diffuse colonic wall thickening and mucosal hyperenhancement suggestive of colitis. No bowel obstruction. REPRODUCTIVE: Reproductive organs are unremarkable. PERITONEUM AND RETROPERITONEUM: Small volume abdominal pelvic ascites. No free intraperitoneal air. Similar nodular thickening in the left upper quadrant along the peritoneum which demonstrated FDG avidity on 12/30/2023 (series 4 image 13). LYMPH NODES: Similar left paraaortic lymphadenopathy, for example a 1.5 x 2.0 cm node on series 6 image 86 is not substantially changed from 12/30/2023. BONES AND SOFT TISSUES: No acute abnormality of the visualized bones. No focal soft tissue abnormality. IMPRESSION: 1. No pulmonary embolism. 2. Wall thickening of the esophagus, stomach may be due to infection or inflammation. Infiltrative neoplasm not excluded. 3. Wall thickening of the colon compatible with infectious or inflammatory pancolitis. 4. Small left pleural effusion and atelectasis in the left lower lobe. 5. Increased size of a precarinal  lymph node. Re-demonstrated mediastinal, hilar, and retroperitoneal metastatic lymphadenopathy. 6. Small pericardial effusion and mild thickening of the pericardium suggesting inflammation or infection. Electronically signed by: Norman Gatlin MD 02/03/2024 01:20 AM EDT RP Workstation: [id number]   DG Chest Portable 1 View Result Date: 02/02/2024 CLINICAL DATA:  Shortness of breath. EXAM: PORTABLE CHEST 1 VIEW COMPARISON:  October 03, 2023 FINDINGS: There is stable right-sided venous Port-A-Cath positioning. The heart size and mediastinal contours are within normal limits. Both lungs are  clear. The visualized skeletal structures are unremarkable. IMPRESSION: No active disease. Electronically Signed   By: Suzen Dials M.D.   On: 02/02/2024 22:37   _______________________________________________________________________________________________________ Latest  Blood pressure (!) 138/98, pulse 66, temperature 98.5 F (36.9 C), resp. rate 18, SpO2 97%.   Vitals  labs and radiology finding personally reviewed  Review of Systems:    Pertinent positives include:  abdominal pain, nausea  Constitutional:  No weight loss, night sweats, Fevers, chills, fatigue, weight loss  HEENT:  No headaches, Difficulty swallowing,Tooth/dental problems,Sore throat,  No sneezing, itching, ear ache, nasal congestion, post nasal drip,  Cardio-vascular:  No chest pain, Orthopnea, PND, anasarca, dizziness, palpitations.no Bilateral lower extremity swelling  GI:  No heartburn, indigestion, , vomiting, diarrhea, change in bowel habits, loss of appetite, melena, blood in stool, hematemesis Resp:  no shortness of breath at rest. No dyspnea on exertion, No excess mucus, no productive cough, No non-productive cough, No coughing up of blood.No change in color of mucus.No wheezing. Skin:  no rash or lesions. No jaundice GU:  no dysuria, change in color of urine, no urgency or frequency. No straining to urinate.  No flank pain.  Musculoskeletal:  No joint pain or no joint swelling. No decreased range of motion. No back pain.  Psych:  No change in mood or affect. No depression or anxiety. No memory loss.  Neuro: no localizing neurological complaints, no tingling, no weakness, no double vision, no gait abnormality, no slurred speech, no confusion  All systems reviewed and apart from HOPI all are negative _______________________________________________________________________________________________ Past Medical History:   Past Medical History:  Diagnosis Date   Asthma       Past Surgical  History:  Procedure Laterality Date   BIOPSY  05/28/2023   Procedure: BIOPSY;  Surgeon: Rosalie Kitchens, MD;  Location: [facility];  Service: Gastroenterology;;   ESOPHAGOGASTRODUODENOSCOPY (EGD) WITH PROPOFOL  N/A 05/28/2023   Procedure: ESOPHAGOGASTRODUODENOSCOPY (EGD) WITH PROPOFOL ;  Surgeon: Rosalie Kitchens, MD;  Location: [facility];  Service: Gastroenterology;  Laterality: N/A;   IR IMAGING GUIDED PORT INSERTION  06/13/2023   UMBILICAL HERNIA REPAIR N/A 06/18/2023   Procedure: PLACEMENT OF J TUBE;  Surgeon: Dasie Leonor CROME, MD;  Location: [facility];  Service: General;  Laterality: N/A;    Social History:  Ambulatory   independently     reports that he quit smoking about 11 years ago. His smoking use included cigarettes. He has never used smokeless tobacco. He reports that he does not drink alcohol and does not use drugs.     Family History:   Family History  Problem Relation Age of Onset   Heart failure Father    ______________________________________________________________________________________________ Allergies: No Known Allergies   Prior to Admission medications   Medication Sig Start Date End Date Taking? Authorizing Provider  albuterol  (PROVENTIL ) (2.5 MG/3ML) 0.083% nebulizer solution Take 3 mLs (2.5 mg total) by nebulization every 6 (six) hours as needed for wheezing or shortness of breath. 08/07/23  Yes Pickenpack-Cousar, Fannie SAILOR, NP  albuterol  (VENTOLIN   HFA) 108 (90 Base) MCG/ACT inhaler Inhale 1-2 puffs into the lungs every 4 (four) hours as needed for wheezing or shortness of breath. 01/31/24  Yes Pasam, Avinash, MD  cyanocobalamin  1000 MCG tablet Take 1 tablet (1,000 mcg total) by mouth daily. 10/14/23  Yes Pickenpack-Cousar, Fannie SAILOR, NP  dexamethasone  (DECADRON ) 4 MG tablet Take 2 tablets (8 mg total) by mouth daily. For 3 days after chemo 01/30/24  Yes Burton, Lacie K, NP  folic acid  (FOLVITE ) 1 MG tablet Take 1 tablet (1 mg total) by mouth daily. 10/14/23  Yes  Pickenpack-Cousar, Fannie SAILOR, NP  gabapentin  (NEURONTIN ) 300 MG capsule Take 1 capsule (300 mg total) by mouth 3 (three) times daily for 30 doses. 02/04/24 02/14/24 Yes Trifan, Donnice PARAS, MD  HYDROmorphone  (DILAUDID ) 2 MG tablet Take 0.5 tablets (1 mg total) by mouth every 8 (eight) hours as needed for severe pain (pain score 7-10). 01/27/24  Yes Pickenpack-Cousar, Fannie SAILOR, NP  methadone  (DOLOPHINE ) 5 MG tablet Take 1 tablet (5 mg total) by mouth daily. Patient taking differently: Take 5 mg by mouth every evening. 01/02/24  Yes Pickenpack-Cousar, Fannie SAILOR, NP  metoCLOPramide  (REGLAN ) 10 MG tablet Take 1 tablet (10 mg total) by mouth 4 (four) times daily  before meals and at bedtime. Patient taking differently: Take 10 mg by mouth in the morning and at bedtime. 01/31/24  Yes Pickenpack-Cousar, Fannie SAILOR, NP  mirtazapine  (REMERON ) 7.5 MG tablet Take 1 tablet (7.5 mg total) by mouth at bedtime. 01/29/24  Yes Pickenpack-Cousar, Fannie SAILOR, NP  ondansetron  (ZOFRAN -ODT) 8 MG disintegrating tablet Dissolve 1 tablet (8 mg total) by mouth every 8 (eight) hours as needed for nausea or vomiting. Patient taking differently: Take 8 mg by mouth 2 (two) times daily. 10/02/23  Yes Pasam, Avinash, MD  pantoprazole  (PROTONIX ) 40 MG tablet Take 1 tablet (40 mg total) by mouth 2 (two) times daily. 01/02/24 03/08/24 Yes [name], NP  potassium chloride  (KLOR-CON ) 10 MEQ tablet Take 1 tablet (10 mEq total) by mouth daily for 20 doses. 02/04/24 02/24/24 Yes Trifan, Donnice PARAS, MD  potassium chloride  SA (KLOR-CON  M) 20 MEQ tablet Take 1 tablet (20 mEq total) by mouth 2 (two) times daily. Patient taking differently: Take 20 mEq by mouth 2 (two) times daily as needed. 10/31/23  Yes Pickenpack-Cousar, Fannie SAILOR, NP  prochlorperazine  (COMPAZINE ) 10 MG tablet Take 1 tablet (10 mg total) by mouth every 6 (six) hours as needed for nausea or vomiting. Patient taking differently: Take 10 mg by mouth every 6 (six) hours. 01/02/24  Yes Pasam, Avinash, MD   Albuterol  Sulfate 2.5 MG/0.5ML NEBU Use 1 vial in nebulizer as directed every 6 hours as needed for wheezing or shortness of breath Patient not taking: Reported on 02/03/2024 04/28/23   Vicky Charleston, PA-C  amoxicillin -clavulanate (AUGMENTIN ) 875-125 MG tablet Take 1 tablet by mouth every 12 (twelve) hours. 02/03/24   Henderly, Britni A, PA-C  dronabinol  (MARINOL ) 5 MG capsule Take 1 capsule (5 mg total) by mouth 2 (two) times daily before lunch and supper. Patient not taking: Reported on 02/03/2024 10/02/23   Pickenpack-Cousar, Athena N, NP  furosemide  (LASIX ) 20 MG tablet Take 1 tablet (20 mg total) by mouth daily as needed for fluid or edema. Patient not taking: Reported on 02/04/2024 08/07/23   Autumn Millman, MD    ___________________________________________________________________________________________________ Physical Exam:    02/04/2024    9:00 PM 02/04/2024    6:51 PM 02/04/2024    4:00 PM  Vitals with BMI  Systolic 138 146 874  Diastolic 98 101 91  Pulse 66 62      1. General:  in No  Acute distress   Chronically ill  cachectic   -appearing 2. Psychological: Alert and   Oriented 3. Head/ENT:    Dry Mucous Membranes                          Head Non traumatic, neck supple                          Poor Dentition 4. SKIN:  decreased Skin turgor,  Skin clean Dry and intact no rash    5. Heart: Regular rate and rhythm no  Murmur, no Rub or gallop 6. Lungs:  no wheezes or crackles   7. Abdomen: Soft,  tender, Non distended  bowel sounds present 8. Lower extremities: no clubbing, cyanosis, no  edema 9. Neurologically Grossly intact, moving all 4 extremities equally   10. MSK: Normal range of motion    Chart has been reviewed  ______________________________________________________________________________________________  Assessment/Plan  48 y.o. male with medical history significant of gastric adenocarcinoma     Admitted for  Paresthesia, Cancer-related pain, Hypokalemia    Present on Admission:  Hypokalemia  Adenocarcinoma of gastroesophageal junction (HCC)  Anemia due to chronic blood loss  Asthma  Cancer associated pain  Malnutrition of moderate degree  Tobacco abuse  Hypophosphatemia    Adenocarcinoma of gastroesophageal junction (HCC) ER provider discussed case with oncology who are aware of patient being admitted Patient this point would like to be DO NOT RESUSCITATE palliative care consulted  Anemia due to chronic blood loss Transfuse as needed for hemoglobin below   7  Asthma Chronic stable continue albuterol  as needed  Cancer associated pain For right now continue methadone  5 mg daily add Neurontin  for paresthesias Dilaudid  as needed severe pain appreciate palliative care consult  Hypokalemia - will replace electrolytes and repeat  check Mg, phos and Ca level and replace as needed Monitor on telemetry   Lab Results  Component Value Date   K 2.8 (L) 02/04/2024     Lab Results  Component Value Date   CREATININE 0.42 (L) 02/04/2024   Lab Results  Component Value Date   MG 2.1 02/04/2024   Lab Results  Component Value Date   CALCIUM  7.8 (L) 02/04/2024   PHOS 3.2 08/02/2023     Malnutrition of moderate degree Would benefit from nutritional consult check prealbumin Check thiamine  level and administer thiamine  replacement check level phosphate  Tobacco abuse  - Spoke about importance of quitting spent 5 minutes discussing options for treatment, prior attempts at quitting, and dangers of smoking  -At this point patient is   NOT  interested in quitting  - refused nicotine patch   - nursing tobacco cessation protocol   Hypophosphatemia Will replace   Other plan as per orders.  DVT prophylaxis:  SCD      Code Status:  DNR/DNI   as per patient   I had personally discussed CODE STATUS with patient and family   ACP   none    Family Communication:   Family  at  Bedside  plan of care was discussed  with  caregiver  Diet regular   Disposition Plan:       To home once workup is complete and patient is stable   Following barriers for discharge:  Electrolytes corrected                                                           Pain controlled with PO medications                                                          Will need consultants to evaluate patient prior to discharge                               Consult Orders  (From admission, onward)           Start     Ordered   02/05/24 2248  Nutritional services consult  Once       Provider:  (Not yet assigned)  Question:  Reason for Consult?  Answer:  assess nutrtional status   02/04/24 2248   02/04/24 2248  Consult to Palliative Care  Once       Provider:  (Not yet assigned)  Question Answer Comment  Palliative Care Consult Services Palliative Medicine Consult   Palliative Care Consult Services Symptom Management Consult   Reason for Consult? goals of care      02/04/24 2248   02/04/24 2110  Consult to hospitalist  Once       Provider:  (Not yet assigned)  Question Answer Comment  Place call to: [facility]   Reason for Consult Admit      02/04/24 2109                               Nutrition    consulted                                      Consults called: Oncology is aware   Admission status:  ED Disposition     ED Disposition  Admit   Condition  --   Comment  Hospital Area: [facility] [[id number]]  Level of Care: Telemetry [5]  Admit to tele based on following criteria: Other see comments  Comments: hypokalemia  May place patient in observation at [facility] or [facility] if equivalent level of care is available:: No  Covid Evaluation: Asymptomatic - no recent exposure (last 10 days) testing not required  Diagnosis: Hypokalemia [827819]  Admitting Physician: Charli Liberatore [[id number]]  Attending Physician: Chawn Spraggins,  Tashay Bozich [[id number]]  For patients discharging to extended facilities (i.e. SNF, AL, group homes or LTAC) initiate:: Discharge to SNF/Facility Placement COVID-19 Lab Testing Protocol           Obs     Level of care     tele  For 12H    Demitrious Mccannon 02/04/2024, 10:56 PM    [facility]     after 2 AM please page floor coverage   If 7AM-7PM, please contact the day team taking care of the patient using [email or web address]

## 2024-02-04 NOTE — ED Notes (Signed)
 ED TO INPATIENT HANDOFF REPORT  Name/Age/Gender Jesus Haynes 48 y.o. male  Code Status Code Status History     Date Active Date Inactive Code Status Order ID Comments User Context   07/30/2023 1205 08/02/2023 1645 Full Code [id number]  Celinda Alm Lot, MD ED   06/14/2023 1621 07/11/2023 2225 Full Code [id number]  Celinda Alm Lot, MD Inpatient   06/13/2023 1253 06/14/2023 0509 Full Code [id number]  Philip Cornet, MD HOV   05/27/2023 1516 05/30/2023 1651 Full Code [id number]  Zella Katha HERO, MD ED    Questions for Most Recent Historical Code Status (Order [id number])     Question Answer   By: Consent: discussion documented in EHR                Advance Directive Documentation    Flowsheet Row Most Recent Value  Type of Advance Directive Healthcare Power of Attorney  Pre-existing out of facility DNR order (yellow form or pink MOST form) --  MOST Form in Place? --    Home/SNF/Other Home  Chief Complaint Hypokalemia [E87.6]  Level of Care/Admitting Diagnosis ED Disposition     ED Disposition  Admit   Condition  --   Comment  Hospital Area: [facility] [[id number]]  Level of Care: Telemetry [5]  Admit to tele based on following criteria: Other see comments  Comments: hypokalemia  May place patient in observation at [facility] or [facility] if equivalent level of care is available:: No  Covid Evaluation: Asymptomatic - no recent exposure (last 10 days) testing not required  Diagnosis: Hypokalemia [827819]  Admitting Physician: DOUTOVA, ANASTASSIA [[id number]]  Attending Physician: DOUTOVA, ANASTASSIA [[id number]]  For patients discharging to extended facilities (i.e. SNF, AL, group homes or LTAC) initiate:: Discharge to SNF/Facility Placement COVID-19 Lab Testing Protocol          Medical History Past Medical History:  Diagnosis Date   Asthma     Allergies No Known Allergies  IV Location/Drains/Wounds Patient Lines/Drains/Airways Status      Active Line/Drains/Airways     Name Placement date Placement time Site Days   Implanted Port 06/13/23 Right Chest Single Power 06/13/23  1222  Chest  236   Gastrostomy/Enterostomy Jejunostomy 16 Fr. LUQ 06/18/23  1630  LUQ  231            Labs/Imaging Results for orders placed or performed during the hospital encounter of 02/04/24 (from the past 48 hours)  Basic metabolic panel     Status: Abnormal   Collection Time: 02/04/24  5:11 PM  Result Value Ref Range   Sodium 134 (L) 135 - 145 mmol/L   Potassium 2.8 (L) 3.5 - 5.1 mmol/L   Chloride 101 98 - 111 mmol/L   CO2 24 22 - 32 mmol/L   Glucose, Bld 84 70 - 99 mg/dL    Comment: Glucose reference range applies only to samples taken after fasting for at least 8 hours.   BUN 9 6 - 20 mg/dL   Creatinine, Ser 9.57 (L) 0.61 - 1.24 mg/dL   Calcium  7.8 (L) 8.9 - 10.3 mg/dL   GFR, Estimated >39 >39 mL/min    Comment: (NOTE) Calculated using the CKD-EPI Creatinine Equation (2021)    Anion gap 9 5 - 15    Comment: Performed at [facility], [address], [place], [place] [address]  CBC     Status: Abnormal   Collection Time: 02/04/24  5:11 PM  Result Value Ref Range   WBC  2.7 (L) 4.0 - 10.5 K/uL   RBC 4.13 (L) 4.22 - 5.81 MIL/uL   Hemoglobin 11.1 (L) 13.0 - 17.0 g/dL   HCT 65.3 (L) 60.9 - 47.9 %   MCV 83.8 80.0 - 100.0 fL   MCH 26.9 26.0 - 34.0 pg   MCHC 32.1 30.0 - 36.0 g/dL   RDW 84.5 88.4 - 84.4 %   Platelets 144 (L) 150 - 400 K/uL   nRBC 0.0 0.0 - 0.2 %    Comment: Performed at [facility], [address] [place], [place] [address]  Magnesium      Status: None   Collection Time: 02/04/24  5:11 PM  Result Value Ref Range   Magnesium  2.1 1.7 - 2.4 mg/dL    Comment: Performed at [facility], [address], [place], [place] [address]   CT Angio Chest PE W and/or Wo Contrast Result Date: 02/03/2024 EXAM: CTA CHEST PE WITHOUT AND WITH CONTRAST CT ABDOMEN AND PELVIS  WITHOUT AND WITH CONTRAST 02/03/2024 12:54:51 AM TECHNIQUE: CTA of the chest was performed after the administration of intravenous contrast. Multiplanar reformatted images are provided for review. MIP images are provided for review. CT of the abdomen and pelvis was performed with the administration of intravenous contrast. Automated exposure control, iterative reconstruction, and/or weight based adjustment of the mA/kV was utilized to reduce the radiation dose to as low as reasonably achievable. COMPARISON: Chest radiograph 02/02/2024 and PET/CT 12/30/2023. CLINICAL HISTORY: Pulmonary embolism (PE) suspected, low to intermediate prob, positive D-dimer; SOB, abd pain, pos ddimer, hx meta CA. FINDINGS: CHEST: PULMONARY ARTERIES: Pulmonary arteries are adequately opacified for evaluation. No intraluminal filling defect to suggest pulmonary embolism. Main pulmonary artery is normal in caliber. MEDIASTINUM: Interval increase in size of a precarinal lymph node measuring 1.6 cm, previously 0.9 cm (series 8 image 159). Similar size of the enlarged subcarinal lymph node measuring 1.4 cm. Right hilar lymph node measuring 1.2 cm on series 8 image 187. Enlarged left hilar lymph node measuring 1.2 cm on series 8 image 161. Small pericardial effusion similar to prior. There is mild thickening of the pericardium suggesting inflammation or infection. LUNGS AND PLEURA: Debris fills the right lower lobe bronchus, suspicious for aspiration. No focal consolidation or pneumothorax. Trace left pleural effusion and atelectasis in the left lower lobe. SOFT TISSUES AND BONES: No acute fracture or destructive osseous lesion in the chest. Chest wall port-a-cath on the right with tip in the right atrium. ABDOMEN AND PELVIS: LIVER: Stable hepatic cysts. GALLBLADDER AND BILE DUCTS: Gallbladder is unremarkable. No biliary ductal dilatation. SPLEEN: Spleen demonstrates no acute abnormality. PANCREAS: Pancreas demonstrates no acute abnormality.  ADRENAL GLANDS: Adrenal glands demonstrate no acute abnormality. KIDNEYS, URETERS AND BLADDER: No stones in the kidneys or ureters. No hydronephrosis. No perinephric or periureteral stranding. Urinary bladder is unremarkable. GI AND BOWEL: Wall thickening of the distal esophagus near the gastroesophageal junction similar to prior. Thickening of the stomach wall with mucosal hyperenhancement. Diffuse colonic wall thickening and mucosal hyperenhancement suggestive of colitis. No bowel obstruction. REPRODUCTIVE: Reproductive organs are unremarkable. PERITONEUM AND RETROPERITONEUM: Small volume abdominal pelvic ascites. No free intraperitoneal air. Similar nodular thickening in the left upper quadrant along the peritoneum which demonstrated FDG avidity on 12/30/2023 (series 4 image 13). LYMPH NODES: Similar left paraaortic lymphadenopathy, for example a 1.5 x 2.0 cm node on series 6 image 86 is not substantially changed from 12/30/2023. BONES AND SOFT TISSUES: No acute abnormality of the visualized bones. No focal soft tissue  abnormality. IMPRESSION: 1. No pulmonary embolism. 2. Wall thickening of the esophagus, stomach may be due to infection or inflammation. Infiltrative neoplasm not excluded. 3. Wall thickening of the colon compatible with infectious or inflammatory pancolitis. 4. Small left pleural effusion and atelectasis in the left lower lobe. 5. Increased size of a precarinal lymph node. Re-demonstrated mediastinal, hilar, and retroperitoneal metastatic lymphadenopathy. 6. Small pericardial effusion and mild thickening of the pericardium suggesting inflammation or infection. Electronically signed by: Norman Gatlin MD 02/03/2024 01:20 AM EDT RP Workstation: [id number]   CT ABDOMEN PELVIS W CONTRAST Result Date: 02/03/2024 EXAM: CTA CHEST PE WITHOUT AND WITH CONTRAST CT ABDOMEN AND PELVIS WITHOUT AND WITH CONTRAST 02/03/2024 12:54:51 AM TECHNIQUE: CTA of the chest was performed after the administration of  intravenous contrast. Multiplanar reformatted images are provided for review. MIP images are provided for review. CT of the abdomen and pelvis was performed with the administration of intravenous contrast. Automated exposure control, iterative reconstruction, and/or weight based adjustment of the mA/kV was utilized to reduce the radiation dose to as low as reasonably achievable. COMPARISON: Chest radiograph 02/02/2024 and PET/CT 12/30/2023. CLINICAL HISTORY: Pulmonary embolism (PE) suspected, low to intermediate prob, positive D-dimer; SOB, abd pain, pos ddimer, hx meta CA. FINDINGS: CHEST: PULMONARY ARTERIES: Pulmonary arteries are adequately opacified for evaluation. No intraluminal filling defect to suggest pulmonary embolism. Main pulmonary artery is normal in caliber. MEDIASTINUM: Interval increase in size of a precarinal lymph node measuring 1.6 cm, previously 0.9 cm (series 8 image 159). Similar size of the enlarged subcarinal lymph node measuring 1.4 cm. Right hilar lymph node measuring 1.2 cm on series 8 image 187. Enlarged left hilar lymph node measuring 1.2 cm on series 8 image 161. Small pericardial effusion similar to prior. There is mild thickening of the pericardium suggesting inflammation or infection. LUNGS AND PLEURA: Debris fills the right lower lobe bronchus, suspicious for aspiration. No focal consolidation or pneumothorax. Trace left pleural effusion and atelectasis in the left lower lobe. SOFT TISSUES AND BONES: No acute fracture or destructive osseous lesion in the chest. Chest wall port-a-cath on the right with tip in the right atrium. ABDOMEN AND PELVIS: LIVER: Stable hepatic cysts. GALLBLADDER AND BILE DUCTS: Gallbladder is unremarkable. No biliary ductal dilatation. SPLEEN: Spleen demonstrates no acute abnormality. PANCREAS: Pancreas demonstrates no acute abnormality. ADRENAL GLANDS: Adrenal glands demonstrate no acute abnormality. KIDNEYS, URETERS AND BLADDER: No stones in the kidneys or  ureters. No hydronephrosis. No perinephric or periureteral stranding. Urinary bladder is unremarkable. GI AND BOWEL: Wall thickening of the distal esophagus near the gastroesophageal junction similar to prior. Thickening of the stomach wall with mucosal hyperenhancement. Diffuse colonic wall thickening and mucosal hyperenhancement suggestive of colitis. No bowel obstruction. REPRODUCTIVE: Reproductive organs are unremarkable. PERITONEUM AND RETROPERITONEUM: Small volume abdominal pelvic ascites. No free intraperitoneal air. Similar nodular thickening in the left upper quadrant along the peritoneum which demonstrated FDG avidity on 12/30/2023 (series 4 image 13). LYMPH NODES: Similar left paraaortic lymphadenopathy, for example a 1.5 x 2.0 cm node on series 6 image 86 is not substantially changed from 12/30/2023. BONES AND SOFT TISSUES: No acute abnormality of the visualized bones. No focal soft tissue abnormality. IMPRESSION: 1. No pulmonary embolism. 2. Wall thickening of the esophagus, stomach may be due to infection or inflammation. Infiltrative neoplasm not excluded. 3. Wall thickening of the colon compatible with infectious or inflammatory pancolitis. 4. Small left pleural effusion and atelectasis in the left lower lobe. 5. Increased size of a precarinal lymph node. Re-demonstrated mediastinal,  hilar, and retroperitoneal metastatic lymphadenopathy. 6. Small pericardial effusion and mild thickening of the pericardium suggesting inflammation or infection. Electronically signed by: Norman Gatlin MD 02/03/2024 01:20 AM EDT RP Workstation: [id number]   DG Chest Portable 1 View Result Date: 02/02/2024 CLINICAL DATA:  Shortness of breath. EXAM: PORTABLE CHEST 1 VIEW COMPARISON:  October 03, 2023 FINDINGS: There is stable right-sided venous Port-A-Cath positioning. The heart size and mediastinal contours are within normal limits. Both lungs are clear. The visualized skeletal structures are unremarkable. IMPRESSION: No  active disease. Electronically Signed   By: Suzen Dials M.D.   On: 02/02/2024 22:37    Pending Labs Unresulted Labs (From admission, onward)    None       Vitals/Pain Today's Vitals   02/04/24 1851 02/04/24 1933 02/04/24 2044 02/04/24 2100  BP: (!) 146/101   (!) 138/98  Pulse: 62   66  Resp: 18   18  Temp:  98.5 F (36.9 C)    TempSrc:      SpO2: 98%   97%  PainSc:   8      Isolation Precautions No active isolations  Medications Medications  calcium  gluconate 1 g/ 50 mL sodium chloride  IVPB (has no administration in time range)  HYDROmorphone  (DILAUDID ) injection 1 mg (1 mg Intravenous Given 02/04/24 1728)  diphenhydrAMINE  (BENADRYL ) injection 12.5 mg (12.5 mg Intravenous Given 02/04/24 1728)  gabapentin  (NEURONTIN ) capsule 300 mg (300 mg Oral Given 02/04/24 1822)  potassium chloride  10 mEq in 100 mL IVPB (0 mEq Intravenous Stopped 02/04/24 2045)  potassium chloride  SA (KLOR-CON  M) CR tablet 40 mEq (40 mEq Oral Given 02/04/24 1822)  HYDROmorphone  (DILAUDID ) injection 0.5 mg (0.5 mg Intravenous Given 02/04/24 2044)    Mobility walks with person assist

## 2024-02-04 NOTE — Subjective & Objective (Signed)
 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 At that time he was evaluated for possible aspiration pneumonia but him and family denies any vomiting they did not endorse any coughing or fevers and did not take any antibiotics that was prescribed. He continues to have pins-and-needles sensation all over his body and comes back. Case was discussed with oncology who recommends starting Neurontin  Also found to have low potassium which supposed to be replaced.  Patient denies any nausea vomiting or diarrhea denies any constipation no fevers or chills

## 2024-02-04 NOTE — Assessment & Plan Note (Addendum)
 ER provider discussed case with oncology who are aware of patient being admitted Patient this point would like to be DO NOT RESUSCITATE palliative care consulted

## 2024-02-04 NOTE — Assessment & Plan Note (Signed)
 For right now continue methadone  5 mg daily add Neurontin  for paresthesias Dilaudid  as needed severe pain appreciate palliative care consult

## 2024-02-04 NOTE — Assessment & Plan Note (Signed)
 Would benefit from nutritional consult check prealbumin Check thiamine  level and administer thiamine  replacement check level phosphate

## 2024-02-04 NOTE — ED Notes (Signed)
 Pt wants his port accessed.

## 2024-02-04 NOTE — ED Provider Notes (Signed)
 [place] [facility] AT [facility] Provider Note   CSN: [id number] Arrival date & time: 02/04/24  1521     Patient presents with: Numbness and Tingling   Jesus Haynes is a 48 y.o. male w/ hx of recently diagnosed gastric adenocarcinoma, here with pain all over the body.  He reports onset of symptoms on Sunday, 3 days ago, worse in his hands and feet but involving the entire body.  Came to ED 2 days ago where he had multiple CT scans and blood tests and was sent home with augmentin  for possible aspiration PNA, though he did not take it because he had no vomiting and didn't feel this was pneumonia.  He continues having whole body pain.  He takes dilaudid  2 mg PRN at home and last took it 4 hours PTA.  His oncologist is away on vacation and their office told him to come back to the ED today.  Current chemo regimen for past 3 weeks:   Chemotherapy     Patient is on Treatment Plan : GASTROESOPHAGEAL Ramucirumab  D1, 15 + Paclitaxel  D1,8,15 q28d      HPI     Prior to Admission medications   Medication Sig Start Date End Date Taking? Authorizing Provider  gabapentin  (NEURONTIN ) 300 MG capsule Take 1 capsule (300 mg total) by mouth 3 (three) times daily for 30 doses. 02/04/24 02/14/24 Yes Vaun Hyndman, Donnice PARAS, MD  potassium chloride  (KLOR-CON ) 10 MEQ tablet Take 1 tablet (10 mEq total) by mouth daily for 20 doses. 02/04/24 02/24/24 Yes Glema Takaki, Donnice PARAS, MD  albuterol  (PROVENTIL ) (2.5 MG/3ML) 0.083% nebulizer solution Take 3 mLs (2.5 mg total) by nebulization every 6 (six) hours as needed for wheezing or shortness of breath. 08/07/23   Pickenpack-Cousar, Athena N, NP  albuterol  (VENTOLIN  HFA) 108 (90 Base) MCG/ACT inhaler Inhale 1-2 puffs into the lungs every 4 (four) hours as needed for wheezing or shortness of breath. 01/31/24   Pasam, Chinita, MD  Albuterol  Sulfate 2.5 MG/0.5ML NEBU Use 1 vial in nebulizer as directed every 6 hours as needed for wheezing or shortness of  breath Patient not taking: Reported on 02/03/2024 04/28/23   Vicky Charleston, PA-C  amoxicillin -clavulanate (AUGMENTIN ) 875-125 MG tablet Take 1 tablet by mouth every 12 (twelve) hours. 02/03/24   Henderly, Britni A, PA-C  cyanocobalamin  1000 MCG tablet Take 1 tablet (1,000 mcg total) by mouth daily. 10/14/23   Pickenpack-Cousar, Athena N, NP  dexamethasone  (DECADRON ) 4 MG tablet Take 2 tablets (8 mg total) by mouth daily. For 3 days after chemo 01/30/24   Burton, Lacie K, NP  dronabinol  (MARINOL ) 5 MG capsule Take 1 capsule (5 mg total) by mouth 2 (two) times daily before lunch and supper. Patient not taking: Reported on 02/03/2024 10/02/23   Pickenpack-Cousar, Athena N, NP  folic acid  (FOLVITE ) 1 MG tablet Take 1 tablet (1 mg total) by mouth daily. 10/14/23   Pickenpack-Cousar, Athena N, NP  furosemide  (LASIX ) 20 MG tablet Take 1 tablet (20 mg total) by mouth daily as needed for fluid or edema. 08/07/23   Pasam, Chinita, MD  HYDROmorphone  (DILAUDID ) 2 MG tablet Take 0.5 tablets (1 mg total) by mouth every 8 (eight) hours as needed for severe pain (pain score 7-10). 01/27/24   Pickenpack-Cousar, Fannie SAILOR, NP  methadone  (DOLOPHINE ) 5 MG tablet Take 1 tablet (5 mg total) by mouth daily. 01/02/24   Pickenpack-Cousar, Athena N, NP  metoCLOPramide  (REGLAN ) 10 MG tablet Take 1 tablet (10 mg total) by mouth 4 (  four) times daily  before meals and at bedtime. Patient taking differently: Take 10 mg by mouth in the morning and at bedtime. 01/31/24   Pickenpack-Cousar, Athena N, NP  mirtazapine  (REMERON ) 7.5 MG tablet Take 1 tablet (7.5 mg total) by mouth at bedtime. 01/29/24   Pickenpack-Cousar, Athena N, NP  ondansetron  (ZOFRAN -ODT) 8 MG disintegrating tablet Dissolve 1 tablet (8 mg total) by mouth every 8 (eight) hours as needed for nausea or vomiting. 10/02/23   Pasam, Chinita, MD  pantoprazole  (PROTONIX ) 40 MG tablet Take 1 tablet (40 mg total) by mouth 2 (two) times daily. 01/02/24 03/08/24  [name], NP  polyethylene  glycol powder (GLYCOLAX /MIRALAX ) 17 GM/SCOOP powder Dissolve 17 g in 4 oz of liquid and take by mouth daily. Patient not taking: Reported on 02/03/2024 07/10/23   Krishnan, Gokul, MD  potassium chloride  SA (KLOR-CON  M) 20 MEQ tablet Take 1 tablet (20 mEq total) by mouth 2 (two) times daily. Patient taking differently: Take 20 mEq by mouth 2 (two) times daily as needed. 10/31/23   Pickenpack-Cousar, Athena N, NP  prochlorperazine  (COMPAZINE ) 10 MG tablet Take 1 tablet (10 mg total) by mouth every 6 (six) hours as needed for nausea or vomiting. 01/02/24   Pasam, Chinita, MD    Allergies: Patient has no known allergies.    Review of Systems  Updated Vital Signs BP (!) 146/101   Pulse 62   Temp 98.5 F (36.9 C)   Resp 18   SpO2 98%   Physical Exam Constitutional:      General: He is not in acute distress. HENT:     Head: Normocephalic and atraumatic.  Eyes:     Conjunctiva/sclera: Conjunctivae normal.     Pupils: Pupils are equal, round, and reactive to light.  Cardiovascular:     Rate and Rhythm: Normal rate and regular rhythm.  Pulmonary:     Effort: Pulmonary effort is normal. No respiratory distress.  Abdominal:     General: There is no distension.     Tenderness: There is no abdominal tenderness.  Skin:    General: Skin is warm and dry.     Comments: Chest wall port  Neurological:     Mental Status: He is alert.     Comments: Strength testing symmetrical upper and lower extremities Reporting paresthesias in entire body, no anesthesia  Psychiatric:        Mood and Affect: Mood normal.        Behavior: Behavior normal.     (all labs ordered are listed, but only abnormal results are displayed) Labs Reviewed  BASIC METABOLIC PANEL WITH GFR - Abnormal; Notable for the following components:      Result Value   Sodium 134 (*)    Potassium 2.8 (*)    Creatinine, Ser 0.42 (*)    Calcium  7.8 (*)    All other components within normal limits  CBC - Abnormal; Notable for the  following components:   WBC 2.7 (*)    RBC 4.13 (*)    Hemoglobin 11.1 (*)    HCT 34.6 (*)    Platelets 144 (*)    All other components within normal limits  MAGNESIUM     EKG: None  Radiology: CT Angio Chest PE W and/or Wo Contrast Result Date: 02/03/2024 EXAM: CTA CHEST PE WITHOUT AND WITH CONTRAST CT ABDOMEN AND PELVIS WITHOUT AND WITH CONTRAST 02/03/2024 12:54:51 AM TECHNIQUE: CTA of the chest was performed after the administration of intravenous contrast. Multiplanar reformatted images are  provided for review. MIP images are provided for review. CT of the abdomen and pelvis was performed with the administration of intravenous contrast. Automated exposure control, iterative reconstruction, and/or weight based adjustment of the mA/kV was utilized to reduce the radiation dose to as low as reasonably achievable. COMPARISON: Chest radiograph 02/02/2024 and PET/CT 12/30/2023. CLINICAL HISTORY: Pulmonary embolism (PE) suspected, low to intermediate prob, positive D-dimer; SOB, abd pain, pos ddimer, hx meta CA. FINDINGS: CHEST: PULMONARY ARTERIES: Pulmonary arteries are adequately opacified for evaluation. No intraluminal filling defect to suggest pulmonary embolism. Main pulmonary artery is normal in caliber. MEDIASTINUM: Interval increase in size of a precarinal lymph node measuring 1.6 cm, previously 0.9 cm (series 8 image 159). Similar size of the enlarged subcarinal lymph node measuring 1.4 cm. Right hilar lymph node measuring 1.2 cm on series 8 image 187. Enlarged left hilar lymph node measuring 1.2 cm on series 8 image 161. Small pericardial effusion similar to prior. There is mild thickening of the pericardium suggesting inflammation or infection. LUNGS AND PLEURA: Debris fills the right lower lobe bronchus, suspicious for aspiration. No focal consolidation or pneumothorax. Trace left pleural effusion and atelectasis in the left lower lobe. SOFT TISSUES AND BONES: No acute fracture or destructive  osseous lesion in the chest. Chest wall port-a-cath on the right with tip in the right atrium. ABDOMEN AND PELVIS: LIVER: Stable hepatic cysts. GALLBLADDER AND BILE DUCTS: Gallbladder is unremarkable. No biliary ductal dilatation. SPLEEN: Spleen demonstrates no acute abnormality. PANCREAS: Pancreas demonstrates no acute abnormality. ADRENAL GLANDS: Adrenal glands demonstrate no acute abnormality. KIDNEYS, URETERS AND BLADDER: No stones in the kidneys or ureters. No hydronephrosis. No perinephric or periureteral stranding. Urinary bladder is unremarkable. GI AND BOWEL: Wall thickening of the distal esophagus near the gastroesophageal junction similar to prior. Thickening of the stomach wall with mucosal hyperenhancement. Diffuse colonic wall thickening and mucosal hyperenhancement suggestive of colitis. No bowel obstruction. REPRODUCTIVE: Reproductive organs are unremarkable. PERITONEUM AND RETROPERITONEUM: Small volume abdominal pelvic ascites. No free intraperitoneal air. Similar nodular thickening in the left upper quadrant along the peritoneum which demonstrated FDG avidity on 12/30/2023 (series 4 image 13). LYMPH NODES: Similar left paraaortic lymphadenopathy, for example a 1.5 x 2.0 cm node on series 6 image 86 is not substantially changed from 12/30/2023. BONES AND SOFT TISSUES: No acute abnormality of the visualized bones. No focal soft tissue abnormality. IMPRESSION: 1. No pulmonary embolism. 2. Wall thickening of the esophagus, stomach may be due to infection or inflammation. Infiltrative neoplasm not excluded. 3. Wall thickening of the colon compatible with infectious or inflammatory pancolitis. 4. Small left pleural effusion and atelectasis in the left lower lobe. 5. Increased size of a precarinal lymph node. Re-demonstrated mediastinal, hilar, and retroperitoneal metastatic lymphadenopathy. 6. Small pericardial effusion and mild thickening of the pericardium suggesting inflammation or infection.  Electronically signed by: Norman Gatlin MD 02/03/2024 01:20 AM EDT RP Workstation: [id number]   CT ABDOMEN PELVIS W CONTRAST Result Date: 02/03/2024 EXAM: CTA CHEST PE WITHOUT AND WITH CONTRAST CT ABDOMEN AND PELVIS WITHOUT AND WITH CONTRAST 02/03/2024 12:54:51 AM TECHNIQUE: CTA of the chest was performed after the administration of intravenous contrast. Multiplanar reformatted images are provided for review. MIP images are provided for review. CT of the abdomen and pelvis was performed with the administration of intravenous contrast. Automated exposure control, iterative reconstruction, and/or weight based adjustment of the mA/kV was utilized to reduce the radiation dose to as low as reasonably achievable. COMPARISON: Chest radiograph 02/02/2024 and PET/CT 12/30/2023. CLINICAL HISTORY: Pulmonary  embolism (PE) suspected, low to intermediate prob, positive D-dimer; SOB, abd pain, pos ddimer, hx meta CA. FINDINGS: CHEST: PULMONARY ARTERIES: Pulmonary arteries are adequately opacified for evaluation. No intraluminal filling defect to suggest pulmonary embolism. Main pulmonary artery is normal in caliber. MEDIASTINUM: Interval increase in size of a precarinal lymph node measuring 1.6 cm, previously 0.9 cm (series 8 image 159). Similar size of the enlarged subcarinal lymph node measuring 1.4 cm. Right hilar lymph node measuring 1.2 cm on series 8 image 187. Enlarged left hilar lymph node measuring 1.2 cm on series 8 image 161. Small pericardial effusion similar to prior. There is mild thickening of the pericardium suggesting inflammation or infection. LUNGS AND PLEURA: Debris fills the right lower lobe bronchus, suspicious for aspiration. No focal consolidation or pneumothorax. Trace left pleural effusion and atelectasis in the left lower lobe. SOFT TISSUES AND BONES: No acute fracture or destructive osseous lesion in the chest. Chest wall port-a-cath on the right with tip in the right atrium. ABDOMEN AND PELVIS:  LIVER: Stable hepatic cysts. GALLBLADDER AND BILE DUCTS: Gallbladder is unremarkable. No biliary ductal dilatation. SPLEEN: Spleen demonstrates no acute abnormality. PANCREAS: Pancreas demonstrates no acute abnormality. ADRENAL GLANDS: Adrenal glands demonstrate no acute abnormality. KIDNEYS, URETERS AND BLADDER: No stones in the kidneys or ureters. No hydronephrosis. No perinephric or periureteral stranding. Urinary bladder is unremarkable. GI AND BOWEL: Wall thickening of the distal esophagus near the gastroesophageal junction similar to prior. Thickening of the stomach wall with mucosal hyperenhancement. Diffuse colonic wall thickening and mucosal hyperenhancement suggestive of colitis. No bowel obstruction. REPRODUCTIVE: Reproductive organs are unremarkable. PERITONEUM AND RETROPERITONEUM: Small volume abdominal pelvic ascites. No free intraperitoneal air. Similar nodular thickening in the left upper quadrant along the peritoneum which demonstrated FDG avidity on 12/30/2023 (series 4 image 13). LYMPH NODES: Similar left paraaortic lymphadenopathy, for example a 1.5 x 2.0 cm node on series 6 image 86 is not substantially changed from 12/30/2023. BONES AND SOFT TISSUES: No acute abnormality of the visualized bones. No focal soft tissue abnormality. IMPRESSION: 1. No pulmonary embolism. 2. Wall thickening of the esophagus, stomach may be due to infection or inflammation. Infiltrative neoplasm not excluded. 3. Wall thickening of the colon compatible with infectious or inflammatory pancolitis. 4. Small left pleural effusion and atelectasis in the left lower lobe. 5. Increased size of a precarinal lymph node. Re-demonstrated mediastinal, hilar, and retroperitoneal metastatic lymphadenopathy. 6. Small pericardial effusion and mild thickening of the pericardium suggesting inflammation or infection. Electronically signed by: Norman Gatlin MD 02/03/2024 01:20 AM EDT RP Workstation: [id number]   DG Chest Portable 1  View Result Date: 02/02/2024 CLINICAL DATA:  Shortness of breath. EXAM: PORTABLE CHEST 1 VIEW COMPARISON:  October 03, 2023 FINDINGS: There is stable right-sided venous Port-A-Cath positioning. The heart size and mediastinal contours are within normal limits. Both lungs are clear. The visualized skeletal structures are unremarkable. IMPRESSION: No active disease. Electronically Signed   By: Suzen Dials M.D.   On: 02/02/2024 22:37     .Critical Care  Performed by: Cottie Donnice PARAS, MD Authorized by: Cottie Donnice PARAS, MD   Critical care provider statement:    Critical care time (minutes):  45   Critical care time was exclusive of:  Separately billable procedures and treating other patients   Critical care was necessary to treat or prevent imminent or life-threatening deterioration of the following conditions:  Metabolic crisis   Critical care was time spent personally by me on the following activities:  Ordering and performing treatments  and interventions, ordering and review of laboratory studies, ordering and review of radiographic studies, pulse oximetry, review of old charts, examination of patient and evaluation of patient's response to treatment   Care discussed with: admitting provider   Comments:     Hypokalemia management, repeat pain medications    Medications Ordered in the ED  calcium  gluconate 1 g/ 50 mL sodium chloride  IVPB (has no administration in time range)  HYDROmorphone  (DILAUDID ) injection 1 mg (1 mg Intravenous Given 02/04/24 1728)  diphenhydrAMINE  (BENADRYL ) injection 12.5 mg (12.5 mg Intravenous Given 02/04/24 1728)  gabapentin  (NEURONTIN ) capsule 300 mg (300 mg Oral Given 02/04/24 1822)  potassium chloride  10 mEq in 100 mL IVPB (0 mEq Intravenous Stopped 02/04/24 2045)  potassium chloride  SA (KLOR-CON  M) CR tablet 40 mEq (40 mEq Oral Given 02/04/24 1822)  HYDROmorphone  (DILAUDID ) injection 0.5 mg (0.5 mg Intravenous Given 02/04/24 2044)    Clinical Course as of 02/04/24  2054  Tue Feb 04, 2024  1741 Dr Lanny oncology suggests gabapentin  300 mg which can help with neuropathy. This may be a chemo medication side effect.  Patient could be managed either in the hospital or outpatient if his pain is improved. [MT]  2031 On reassessment, pt is still quite uncomfortable - we'll admit for pain control and K repletion [MT]    Clinical Course User Index [MT] Vedika Dumlao, Donnice PARAS, MD                                 Medical Decision Making Amount and/or Complexity of Data Reviewed Labs: ordered.  Risk Prescription drug management. Decision regarding hospitalization.   This patient presents to the ED with concern for body pain. This involves an extensive number of treatment options, and is a complaint that carries with it a high risk of complications and morbidity.  The differential diagnosis includes neuropathy related to chemo medications most likely  Co-morbidities that complicate the patient evaluation: chemotherapy  Additional history obtained from patient's caregiver at bedside  External records from outside source obtained and reviewed including oncology treatment records - new chemo regimen started 3 weeks ago  I ordered and personally interpreted labs.  The pertinent results include:  K 2.8, Ca 7.8 - likely dietary (pt has poor appetite). Cr wnl.  The patient was maintained on a cardiac monitor.  I personally viewed and interpreted the cardiac monitored which showed an underlying rhythm of: sinus rhythm  I ordered medication including IV pain meds, Iv and oral K, IV calcium   I have reviewed the patients home medicines and have made adjustments as needed  Test Considered: doubt acute PE, sepsis  After the interventions noted above, I reevaluated the patient and found that they have: stayed the same  Patient started on gabapentin  300 mg TID in the ED.  He continues having persistent pain, with a suspected anxiety component too according to his caregiver.   I tried to set expectations that this issue will likely be chronic, without a quick fix, and they understand this.  However their priority is to minimize discomfort with his malignancy.   We discussed admission overnight for electrolyte repletion and pain control - likely to transition back to oral dilaudid  and gabapentin  tomorrow.    I spoke to oncologist Dr Lanny by phone initially - she agrees with gabapentin .  Can also consider lyrica or cymbalta  in the future; although these are chronic outpatient medications  Dispostion:  After consideration  of the diagnostic results and the patients response to treatment, I feel that the patent would benefit from admission      Final diagnoses:  Paresthesia  Neuropathy  Hypokalemia    ED Discharge Orders          Ordered    potassium chloride  (KLOR-CON ) 10 MEQ tablet  Daily        02/04/24 1926    gabapentin  (NEURONTIN ) 300 MG capsule  3 times daily        02/04/24 1926               Cottie Donnice PARAS, MD 02/04/24 2055

## 2024-02-04 NOTE — ED Triage Notes (Addendum)
 Pt came in for symptoms of generalized pins and needles after switching chemo medications since Sunday. Pt was told to use compressions while on chemo during his appointments. However it started getting worse and radiates to his extremities. Pt has also had shellfish recently and his previous doctor didn't know if there was a correlation or not.

## 2024-02-04 NOTE — Discharge Instructions (Addendum)

## 2024-02-05 ENCOUNTER — Other Ambulatory Visit (HOSPITAL_COMMUNITY): Payer: Self-pay

## 2024-02-05 DIAGNOSIS — E43 Unspecified severe protein-calorie malnutrition: Secondary | ICD-10-CM | POA: Insufficient documentation

## 2024-02-05 DIAGNOSIS — Z66 Do not resuscitate: Secondary | ICD-10-CM | POA: Diagnosis present

## 2024-02-05 DIAGNOSIS — Z7189 Other specified counseling: Secondary | ICD-10-CM

## 2024-02-05 DIAGNOSIS — Z515 Encounter for palliative care: Secondary | ICD-10-CM | POA: Diagnosis not present

## 2024-02-05 DIAGNOSIS — Z79899 Other long term (current) drug therapy: Secondary | ICD-10-CM | POA: Diagnosis not present

## 2024-02-05 DIAGNOSIS — C160 Malignant neoplasm of cardia: Secondary | ICD-10-CM | POA: Diagnosis present

## 2024-02-05 DIAGNOSIS — D500 Iron deficiency anemia secondary to blood loss (chronic): Secondary | ICD-10-CM | POA: Diagnosis present

## 2024-02-05 DIAGNOSIS — R202 Paresthesia of skin: Secondary | ICD-10-CM | POA: Diagnosis not present

## 2024-02-05 DIAGNOSIS — D61818 Other pancytopenia: Secondary | ICD-10-CM | POA: Diagnosis present

## 2024-02-05 DIAGNOSIS — T451X5A Adverse effect of antineoplastic and immunosuppressive drugs, initial encounter: Secondary | ICD-10-CM | POA: Diagnosis present

## 2024-02-05 DIAGNOSIS — D61810 Antineoplastic chemotherapy induced pancytopenia: Secondary | ICD-10-CM | POA: Diagnosis present

## 2024-02-05 DIAGNOSIS — E440 Moderate protein-calorie malnutrition: Secondary | ICD-10-CM | POA: Diagnosis present

## 2024-02-05 DIAGNOSIS — E876 Hypokalemia: Secondary | ICD-10-CM | POA: Diagnosis present

## 2024-02-05 DIAGNOSIS — Z87891 Personal history of nicotine dependence: Secondary | ICD-10-CM | POA: Diagnosis not present

## 2024-02-05 DIAGNOSIS — G620 Drug-induced polyneuropathy: Secondary | ICD-10-CM | POA: Diagnosis present

## 2024-02-05 DIAGNOSIS — J45909 Unspecified asthma, uncomplicated: Secondary | ICD-10-CM | POA: Diagnosis present

## 2024-02-05 DIAGNOSIS — R531 Weakness: Secondary | ICD-10-CM

## 2024-02-05 DIAGNOSIS — R52 Pain, unspecified: Secondary | ICD-10-CM | POA: Diagnosis present

## 2024-02-05 DIAGNOSIS — Z681 Body mass index (BMI) 19 or less, adult: Secondary | ICD-10-CM | POA: Diagnosis not present

## 2024-02-05 DIAGNOSIS — C786 Secondary malignant neoplasm of retroperitoneum and peritoneum: Secondary | ICD-10-CM | POA: Diagnosis present

## 2024-02-05 DIAGNOSIS — G893 Neoplasm related pain (acute) (chronic): Secondary | ICD-10-CM | POA: Diagnosis present

## 2024-02-05 DIAGNOSIS — R64 Cachexia: Secondary | ICD-10-CM | POA: Diagnosis present

## 2024-02-05 DIAGNOSIS — Z8249 Family history of ischemic heart disease and other diseases of the circulatory system: Secondary | ICD-10-CM | POA: Diagnosis not present

## 2024-02-05 LAB — CBC
HCT: 34.4 % — ABNORMAL LOW (ref 39.0–52.0)
Hemoglobin: 10.6 g/dL — ABNORMAL LOW (ref 13.0–17.0)
MCH: 26.2 pg (ref 26.0–34.0)
MCHC: 30.8 g/dL (ref 30.0–36.0)
MCV: 84.9 fL (ref 80.0–100.0)
Platelets: 136 K/uL — ABNORMAL LOW (ref 150–400)
RBC: 4.05 MIL/uL — ABNORMAL LOW (ref 4.22–5.81)
RDW: 15.4 % (ref 11.5–15.5)
WBC: 2.2 K/uL — ABNORMAL LOW (ref 4.0–10.5)
nRBC: 0 % (ref 0.0–0.2)

## 2024-02-05 LAB — COMPREHENSIVE METABOLIC PANEL WITH GFR
ALT: 16 U/L (ref 0–44)
AST: 17 U/L (ref 15–41)
Albumin: 2.7 g/dL — ABNORMAL LOW (ref 3.5–5.0)
Alkaline Phosphatase: 39 U/L (ref 38–126)
Anion gap: 9 (ref 5–15)
BUN: 6 mg/dL (ref 6–20)
CO2: 24 mmol/L (ref 22–32)
Calcium: 8.2 mg/dL — ABNORMAL LOW (ref 8.9–10.3)
Chloride: 101 mmol/L (ref 98–111)
Creatinine, Ser: <0.3 mg/dL — ABNORMAL LOW (ref 0.61–1.24)
Glucose, Bld: 86 mg/dL (ref 70–99)
Potassium: 3.7 mmol/L (ref 3.5–5.1)
Sodium: 134 mmol/L — ABNORMAL LOW (ref 135–145)
Total Bilirubin: 0.7 mg/dL (ref 0.0–1.2)
Total Protein: 5.1 g/dL — ABNORMAL LOW (ref 6.5–8.1)

## 2024-02-05 LAB — PHOSPHORUS: Phosphorus: 3.2 mg/dL (ref 2.5–4.6)

## 2024-02-05 LAB — MAGNESIUM: Magnesium: 2 mg/dL (ref 1.7–2.4)

## 2024-02-05 LAB — PREALBUMIN: Prealbumin: 11 mg/dL — ABNORMAL LOW (ref 18–38)

## 2024-02-05 MED ORDER — OXYCODONE HCL 5 MG PO TABS
5.0000 mg | ORAL_TABLET | ORAL | Status: DC | PRN
  Administered 2024-02-05: 5 mg via ORAL
  Filled 2024-02-05: qty 1

## 2024-02-05 MED ORDER — GABAPENTIN 300 MG PO CAPS
300.0000 mg | ORAL_CAPSULE | Freq: Three times a day (TID) | ORAL | Status: DC
  Administered 2024-02-05 – 2024-02-06 (×4): 300 mg via ORAL
  Filled 2024-02-05 (×4): qty 1

## 2024-02-05 MED ORDER — SODIUM CHLORIDE 0.9% FLUSH
10.0000 mL | Freq: Two times a day (BID) | INTRAVENOUS | Status: DC
  Administered 2024-02-05 – 2024-02-06 (×2): 10 mL

## 2024-02-05 MED ORDER — ACETAMINOPHEN 650 MG RE SUPP
650.0000 mg | Freq: Four times a day (QID) | RECTAL | Status: DC | PRN
Start: 2024-02-05 — End: 2024-02-06

## 2024-02-05 MED ORDER — OXYCODONE HCL 5 MG PO TABS
10.0000 mg | ORAL_TABLET | ORAL | Status: DC | PRN
  Administered 2024-02-05 – 2024-02-06 (×4): 10 mg via ORAL
  Filled 2024-02-05 (×4): qty 2

## 2024-02-05 MED ORDER — ONDANSETRON HCL 4 MG/2ML IJ SOLN
4.0000 mg | Freq: Four times a day (QID) | INTRAMUSCULAR | Status: DC | PRN
  Filled 2024-02-05: qty 2

## 2024-02-05 MED ORDER — SODIUM CHLORIDE 0.9 % IV SOLN: INTRAVENOUS | Status: AC

## 2024-02-05 MED ORDER — HYDROMORPHONE HCL 1 MG/ML IJ SOLN
0.5000 mg | INTRAMUSCULAR | Status: DC | PRN
  Administered 2024-02-05 (×2): 1 mg via INTRAVENOUS
  Filled 2024-02-05 (×2): qty 1

## 2024-02-05 MED ORDER — PANTOPRAZOLE SODIUM 40 MG PO TBEC
40.0000 mg | DELAYED_RELEASE_TABLET | Freq: Two times a day (BID) | ORAL | Status: DC
  Administered 2024-02-05 – 2024-02-06 (×4): 40 mg via ORAL
  Filled 2024-02-05 (×4): qty 1

## 2024-02-05 MED ORDER — ENSURE PLUS HIGH PROTEIN PO LIQD
237.0000 mL | Freq: Two times a day (BID) | ORAL | Status: DC
  Administered 2024-02-05: 237 mL via ORAL

## 2024-02-05 MED ORDER — BOOST / RESOURCE BREEZE PO LIQD CUSTOM
1.0000 | Freq: Three times a day (TID) | ORAL | Status: DC
  Administered 2024-02-05 – 2024-02-06 (×3): 1 via ORAL

## 2024-02-05 MED ORDER — HYDROMORPHONE HCL 1 MG/ML IJ SOLN
1.0000 mg | INTRAMUSCULAR | Status: DC | PRN
  Administered 2024-02-05 – 2024-02-06 (×5): 1 mg via INTRAVENOUS
  Filled 2024-02-05 (×5): qty 1

## 2024-02-05 MED ORDER — ALBUTEROL SULFATE (2.5 MG/3ML) 0.083% IN NEBU
2.5000 mg | INHALATION_SOLUTION | Freq: Four times a day (QID) | RESPIRATORY_TRACT | Status: DC | PRN
  Administered 2024-02-05: 2.5 mg via RESPIRATORY_TRACT
  Filled 2024-02-05: qty 3

## 2024-02-05 MED ORDER — CHLORHEXIDINE GLUCONATE CLOTH 2 % EX PADS
6.0000 | MEDICATED_PAD | Freq: Every day | CUTANEOUS | Status: DC
  Administered 2024-02-05 – 2024-02-06 (×2): 6 via TOPICAL

## 2024-02-05 MED ORDER — HYDROMORPHONE HCL 2 MG PO TABS: 1.0000 mg | ORAL_TABLET | Freq: Three times a day (TID) | ORAL | Status: DC | PRN

## 2024-02-05 MED ORDER — METHADONE HCL 5 MG PO TABS
5.0000 mg | ORAL_TABLET | Freq: Every evening | ORAL | Status: DC
  Administered 2024-02-05: 5 mg via ORAL
  Filled 2024-02-05: qty 1

## 2024-02-05 MED ORDER — ONDANSETRON HCL 4 MG PO TABS: 4.0000 mg | ORAL_TABLET | Freq: Four times a day (QID) | ORAL | Status: DC | PRN

## 2024-02-05 MED ORDER — METOCLOPRAMIDE HCL 5 MG/ML IJ SOLN
5.0000 mg | Freq: Two times a day (BID) | INTRAMUSCULAR | Status: DC
  Administered 2024-02-05 – 2024-02-06 (×4): 5 mg via INTRAVENOUS
  Filled 2024-02-05 (×4): qty 2

## 2024-02-05 MED ORDER — ACETAMINOPHEN 325 MG PO TABS
650.0000 mg | ORAL_TABLET | Freq: Four times a day (QID) | ORAL | Status: DC | PRN
  Administered 2024-02-05 – 2024-02-06 (×2): 650 mg via ORAL
  Filled 2024-02-05 (×2): qty 2

## 2024-02-05 MED ORDER — SODIUM CHLORIDE 0.9% FLUSH: 10.0000 mL | INTRAVENOUS | Status: DC | PRN

## 2024-02-05 MED ORDER — FOLIC ACID 1 MG PO TABS
1.0000 mg | ORAL_TABLET | Freq: Every day | ORAL | Status: DC
  Administered 2024-02-05 – 2024-02-06 (×2): 1 mg via ORAL
  Filled 2024-02-05 (×2): qty 1

## 2024-02-05 NOTE — Progress Notes (Signed)
 PROGRESS NOTE    JALIK GELLATLY  FMW:[id number] DOB: 03-05-1976 DOA: 02/04/2024 PCP: [name], NP   Brief Narrative:  Jesus Haynes is a 48 y.o. male with medical history significant of gastric adenocarcinoma who presents with worsening paresthesias and intractable pain in the setting of new chemotherapy.  Oncology consulted and hospitalist called for admission.  Assessment & Plan:   Principal Problem:   Hypokalemia Active Problems:   Adenocarcinoma of gastroesophageal junction (HCC)   Anemia due to chronic blood loss   Cancer associated pain   Asthma   Malnutrition of moderate degree   Tobacco abuse   Hypophosphatemia   Acute intractable neuropathy/pain Adenocarcinoma of gastroesophageal junction (HCC) - Oncology consulted at intake, recommending gabapentin  -increase dose to 3 times daily given uncontrolled pain -Attempt to wean off IV narcotics in the next 24 hours -Continue scheduled methadone , as well as as needed p.o. oxycodone  IR, IV Dilaudid  for breakthrough pain only   Pancytopenia Secondary to above/treatment Hemoglobin near baseline White count and platelet low but stable   Asthma Chronic stable continue albuterol  as needed   Hypokalemia Repleted, within normal limits  DVT prophylaxis: SCDs Start: 02/05/24 0011   Code Status:   Code Status: Limited: Do not attempt resuscitation (DNR) -DNR-LIMITED -Do Not Intubate/DNI   Family Communication: None present  Status is: Inpatient  Dispo: The patient is from: Home              Anticipated d/c is to: Home              Anticipated d/c date is: 24 to 48 hours              Patient currently not medically stable for discharge  Consultants:  Oncology, palliative care  Procedures:  None  Antimicrobials:  None  Subjective: No acute issues or events overnight, pain ongoing but minimally improved from prior  Objective: Vitals:   02/05/24 0100 02/05/24 0254 02/05/24 0420 02/05/24 0847  BP:   (!)  117/94 135/81  Pulse:   (!) 56 (!) 55  Resp:   18 16  Temp:   97.8 F (36.6 C) 98.1 F (36.7 C)  TempSrc:   Oral Oral  SpO2:  95% 97% 98%  Weight: 50.7 kg     Height: 5' 6 (1.676 m)       Intake/Output Summary (Last 24 hours) at 02/05/2024 1239 Last data filed at 02/05/2024 1030 Gross per 24 hour  Intake 284.8 ml  Output --  Net 284.8 ml   Filed Weights   02/05/24 0100  Weight: 50.7 kg    Examination:  General:  Pleasantly resting in bed, No acute distress. HEENT:  Normocephalic atraumatic.  Sclerae nonicteric, noninjected.  Extraocular movements intact bilaterally. Neck:  Without mass or deformity.  Trachea is midline. Lungs:  Clear to auscultate bilaterally without rhonchi, wheeze, or rales. Heart:  Regular rate and rhythm.  Without murmurs, rubs, or gallops. Abdomen:  Soft, nontender, nondistended.  Without guarding or rebound. Extremities: Without cyanosis, clubbing, edema, or obvious deformity. Skin:  Warm and dry, no erythema.  Data Reviewed: I have personally reviewed following labs and imaging studies  CBC: Recent Labs  Lab 02/02/24 2301 02/04/24 1711 02/05/24 0555  WBC 5.8 2.7* 2.2*  NEUTROABS 4.2  --   --   HGB 12.1* 11.1* 10.6*  HCT 37.9* 34.6* 34.4*  MCV 83.3 83.8 84.9  PLT 207 144* 136*   Basic Metabolic Panel: Recent Labs  Lab 02/02/24 2301 02/04/24  1711 02/05/24 0555  NA 133* 134* 134*  K 3.0* 2.8* 3.7  CL 96* 101 101  CO2 21* 24 24  GLUCOSE 122* 84 86  BUN 13 9 6   CREATININE 0.56* 0.42* <0.30*  CALCIUM  8.5* 7.8* 8.2*  MG 2.2 2.1 2.0  PHOS  --  1.6* 3.2   GFR: CrCl cannot be calculated (This lab value cannot be used to calculate CrCl because it is not a number: <0.30). Liver Function Tests: Recent Labs  Lab 02/02/24 2301 02/05/24 0555  AST 25 17  ALT 18 16  ALKPHOS 46 39  BILITOT 1.1 0.7  PROT 6.0* 5.1*  ALBUMIN 3.4* 2.7*   Recent Labs  Lab 02/02/24 2301  LIPASE 20   No results found for this or any previous visit (from  the past 240 hours).   Radiology Studies: No results found.  Scheduled Meds:  feeding supplement  237 mL Oral BID BM   folic acid   1 mg Oral Daily   gabapentin   300 mg Oral TID   methadone   5 mg Oral QPM   metoCLOPramide  (REGLAN ) injection  5 mg Intravenous Q12H   pantoprazole   40 mg Oral BID   thiamine  (VITAMIN B1) injection  100 mg Intravenous Daily   Continuous Infusions:   LOS: 0 days   Time spent:  Elsie JAYSON [place], DO [facility]  If 7PM-7AM, please contact night-coverage [email or web address]  02/05/2024, 12:39 PM

## 2024-02-05 NOTE — Progress Notes (Addendum)
 Initial Nutrition Assessment  DOCUMENTATION CODES:   Severe malnutrition in context of chronic illness, Underweight  INTERVENTION:   -Boost Breeze po TID, each supplement provides 250 kcal and 9 grams of protein   -Magic cup TID with meals, each supplement provides 290 kcal and 9 grams of protein   -Awaiting Thiamine  labs. Will also order B12 labs given recent symptoms.  -Placed High Calorie, High Protein handout in AVS  NUTRITION DIAGNOSIS:   Severe Malnutrition related to chronic illness, cancer and cancer related treatments as evidenced by severe fat depletion, severe muscle depletion, percent weight loss.  GOAL:   Patient will meet greater than or equal to 90% of their needs  MONITOR:   PO intake, Supplement acceptance  REASON FOR ASSESSMENT:   Consult Assessment of nutrition requirement/status  ASSESSMENT:   48 y.o. male with medical history significant of gastric adenocarcinoma. Presented with severe paresthesia and  diffuse pain all over. Pain describes as pins-and-needles.  Patient in room, no family at bedside. States he has not been doing well since chemotherapy treatment was switched 3 weeks ago. States eating has consistently been an issue. Previously had a J-tube which came out in April of this year. Initially pt was eating more but since has started decreasing in how much he can eat d/t fullness. Tries to eat small frequent meals. Doesn't use any nutritional supplements. Report infrequent vomiting when taking medications. Denies swallowing, chewing difficulty or taste changes. Reports normal bowel movements. No vision changes or mouth pain.  Ensure was ordered but pt states this fills him up. Willing to try Boost Breeze supplements instead. Willing to try Magic cups as well.  Ate a blueberry muffin and 1 pancake this morning for breakfast.  Pt reports feeling like pins and needles all over his body along with dizziness. MD has ordered Thiamine  labs and  started supplementation. Will also check B12 labs.   Per weight records, pt has lost 27 lbs since 5/21 (19% wt loss x 2.5 months, significant for time frame).  Medications: Folic acid  , Reglan , Thiamine , K Phos   Labs reviewed: Low Na   NUTRITION - FOCUSED PHYSICAL EXAM:  Flowsheet Row Most Recent Value  Orbital Region Moderate depletion  Upper Arm Region Severe depletion  Thoracic and Lumbar Region Moderate depletion  Buccal Region Moderate depletion  Temple Region Severe depletion  Clavicle Bone Region Severe depletion  Clavicle and Acromion Bone Region Severe depletion  Scapular Bone Region Severe depletion  Dorsal Hand Moderate depletion  Patellar Region Severe depletion  Anterior Thigh Region Severe depletion  Posterior Calf Region Severe depletion  Edema (RD Assessment) None  Hair Reviewed  Eyes Reviewed  Mouth Reviewed  [missing teeth]  Skin Reviewed  Nails Reviewed    Diet Order:   Diet Order             Diet regular Room service appropriate? Yes; Fluid consistency: Thin  Diet effective now                   EDUCATION NEEDS:   No education needs have been identified at this time  Skin:  Skin Assessment: Reviewed RN Assessment  Last BM:  PTA  Height:   Ht Readings from Last 1 Encounters:  02/05/24 5' 6 (1.676 m)    Weight:   Wt Readings from Last 1 Encounters:  02/05/24 50.7 kg    BMI:  Body mass index is 18.04 kg/m.  Estimated Nutritional Needs:   Kcal:  2000-2200  Protein:  100-110g  Fluid:  2L/day   Morna Lee, MS, RD, LDN Inpatient Clinical Dietitian Contact via Secure chat

## 2024-02-05 NOTE — Progress Notes (Addendum)
 Jesus Haynes   DOB:April 16, 1976   FM#:[id number]      ASSESSMENT & PLAN:  Jesus Haynes is a 48 year old male patient with oncologic history significant for GEJ adenocarcinoma.  Patient admitted on 02/04/2024 due to complaints of numbness tingling and body pain.  Cancer related pain Neuropathy - Patient complains of pain in his whole body and numbness and tingling of the extremities. - Continue gabapentin  as ordered. - Palliative following, continue pain medication regimen as ordered. - Patient reports today that he pain is much better since starting gabapentin  and neuropathy is a little better. - Continue supportive care  Adenocarcinoma of gastroesophageal junction, stage IV Peritoneal carcinomatosis - Diagnosed - Initiated palliative treatment with FOLFOX + Nivo on 07/23/2023.  Subsequently received dose reduced FOLFOX and Nivo.  Received cycle 6 on 11/06/2023.  Plan was to continue maintenance treatments however due to significant fatigue and weight loss, only Nivo was used. - Medical oncology/Dr. Pasam is primary oncologist.  Dr. Lanny is providing oncology coverage and will make further recommendations as appropriate. -Recommend close outpatient oncology follow-up upon discharge.  Patient has appointment on 02/12/2024 with Dr. Autumn.  Pancytopenia: - Likely due to recent chemotherapy Leukopenia - WBC low 2.2, no intervention required at this time. Anemia - Hemoglobin 10.6.  No intervention required. Thrombocytopenia - Platelets low 136K -Continue to monitor CBC with differential  Hypokalemia - Resolving - Potassium 3.7 today, continue repletion as ordered - Will monitor levels at outpatient visit   Code Status DNR-limited  Subjective:  Patient seen awake alert and oriented laying in bed.  Reports that he feels much better since he started taking the gabapentin  and that the neuropathy is a little better.  Denies GI symptoms or chest pain.  No other acute distress is  noted.  Objective:   Intake/Output Summary (Last 24 hours) at 02/05/2024 1249 Last data filed at 02/05/2024 1030 Gross per 24 hour  Intake 284.8 ml  Output --  Net 284.8 ml     PHYSICAL EXAMINATION: ECOG PERFORMANCE STATUS: 2 - Symptomatic, <50% confined to bed  Vitals:   02/05/24 0420 02/05/24 0847  BP: (!) 117/94 135/81  Pulse: (!) 56 (!) 55  Resp: 18 16  Temp: 97.8 F (36.6 C) 98.1 F (36.7 C)  SpO2: 97% 98%   Filed Weights   02/05/24 0100  Weight: 111 lb 12.4 oz (50.7 kg)    GENERAL: alert, no distress and comfortable +thin appearing SKIN: skin color, texture, turgor are normal, no rashes or significant lesions EYES: normal, conjunctiva are pink and non-injected, sclera clear OROPHARYNX: no exudate, no erythema and lips, buccal mucosa, and tongue normal  NECK: supple, thyroid  normal size, non-tender, without nodularity LYMPH: no palpable lymphadenopathy in the cervical, axillary or inguinal LUNGS: clear to auscultation and percussion with normal breathing effort HEART: regular rate & rhythm and no murmurs and no lower extremity edema ABDOMEN: abdomen soft, non-tender and normal bowel sounds MUSCULOSKELETAL: no cyanosis of digits and no clubbing  PSYCH: alert & oriented x 3 with fluent speech NEURO: no focal motor/sensory deficits   All questions were answered. The patient knows to call the clinic with any problems, questions or concerns.   The total time spent in the appointment was 40 minutes encounter with patient including review of chart and various tests results, discussions about plan of care and coordination of care plan  Olam JINNY Brunner, NP 02/05/2024 12:49 PM    Labs Reviewed:  Lab Results  Component Value Date   WBC  2.2 (L) 02/05/2024   HGB 10.6 (L) 02/05/2024   HCT 34.4 (L) 02/05/2024   MCV 84.9 02/05/2024   PLT 136 (L) 02/05/2024   Recent Labs    01/29/24 0916 02/02/24 2301 02/04/24 1711 02/05/24 0555  NA 136 133* 134* 134*  K 3.4* 3.0*  2.8* 3.7  CL 101 96* 101 101  CO2 27 21* 24 24  GLUCOSE 92 122* 84 86  BUN 11 13 9 6   CREATININE 0.55* 0.56* 0.42* <0.30*  CALCIUM  8.9 8.5* 7.8* 8.2*  GFRNONAA >60 >60 >60 NOT CALCULATED  PROT 6.3* 6.0*  --  5.1*  ALBUMIN 3.5 3.4*  --  2.7*  AST 16 25  --  17  ALT 9 18  --  16  ALKPHOS 47 46  --  39  BILITOT 0.5 1.1  --  0.7    Studies Reviewed:  CT Angio Chest PE W and/or Wo Contrast Result Date: 02/03/2024 EXAM: CTA CHEST PE WITHOUT AND WITH CONTRAST CT ABDOMEN AND PELVIS WITHOUT AND WITH CONTRAST 02/03/2024 12:54:51 AM TECHNIQUE: CTA of the chest was performed after the administration of intravenous contrast. Multiplanar reformatted images are provided for review. MIP images are provided for review. CT of the abdomen and pelvis was performed with the administration of intravenous contrast. Automated exposure control, iterative reconstruction, and/or weight based adjustment of the mA/kV was utilized to reduce the radiation dose to as low as reasonably achievable. COMPARISON: Chest radiograph 02/02/2024 and PET/CT 12/30/2023. CLINICAL HISTORY: Pulmonary embolism (PE) suspected, low to intermediate prob, positive D-dimer; SOB, abd pain, pos ddimer, hx meta CA. FINDINGS: CHEST: PULMONARY ARTERIES: Pulmonary arteries are adequately opacified for evaluation. No intraluminal filling defect to suggest pulmonary embolism. Main pulmonary artery is normal in caliber. MEDIASTINUM: Interval increase in size of a precarinal lymph node measuring 1.6 cm, previously 0.9 cm (series 8 image 159). Similar size of the enlarged subcarinal lymph node measuring 1.4 cm. Right hilar lymph node measuring 1.2 cm on series 8 image 187. Enlarged left hilar lymph node measuring 1.2 cm on series 8 image 161. Small pericardial effusion similar to prior. There is mild thickening of the pericardium suggesting inflammation or infection. LUNGS AND PLEURA: Debris fills the right lower lobe bronchus, suspicious for aspiration. No  focal consolidation or pneumothorax. Trace left pleural effusion and atelectasis in the left lower lobe. SOFT TISSUES AND BONES: No acute fracture or destructive osseous lesion in the chest. Chest wall port-a-cath on the right with tip in the right atrium. ABDOMEN AND PELVIS: LIVER: Stable hepatic cysts. GALLBLADDER AND BILE DUCTS: Gallbladder is unremarkable. No biliary ductal dilatation. SPLEEN: Spleen demonstrates no acute abnormality. PANCREAS: Pancreas demonstrates no acute abnormality. ADRENAL GLANDS: Adrenal glands demonstrate no acute abnormality. KIDNEYS, URETERS AND BLADDER: No stones in the kidneys or ureters. No hydronephrosis. No perinephric or periureteral stranding. Urinary bladder is unremarkable. GI AND BOWEL: Wall thickening of the distal esophagus near the gastroesophageal junction similar to prior. Thickening of the stomach wall with mucosal hyperenhancement. Diffuse colonic wall thickening and mucosal hyperenhancement suggestive of colitis. No bowel obstruction. REPRODUCTIVE: Reproductive organs are unremarkable. PERITONEUM AND RETROPERITONEUM: Small volume abdominal pelvic ascites. No free intraperitoneal air. Similar nodular thickening in the left upper quadrant along the peritoneum which demonstrated FDG avidity on 12/30/2023 (series 4 image 13). LYMPH NODES: Similar left paraaortic lymphadenopathy, for example a 1.5 x 2.0 cm node on series 6 image 86 is not substantially changed from 12/30/2023. BONES AND SOFT TISSUES: No acute abnormality of the visualized bones. No  focal soft tissue abnormality. IMPRESSION: 1. No pulmonary embolism. 2. Wall thickening of the esophagus, stomach may be due to infection or inflammation. Infiltrative neoplasm not excluded. 3. Wall thickening of the colon compatible with infectious or inflammatory pancolitis. 4. Small left pleural effusion and atelectasis in the left lower lobe. 5. Increased size of a precarinal lymph node. Re-demonstrated mediastinal, hilar,  and retroperitoneal metastatic lymphadenopathy. 6. Small pericardial effusion and mild thickening of the pericardium suggesting inflammation or infection. Electronically signed by: Norman Gatlin MD 02/03/2024 01:20 AM EDT RP Workstation: [id number]   CT ABDOMEN PELVIS W CONTRAST Result Date: 02/03/2024 EXAM: CTA CHEST PE WITHOUT AND WITH CONTRAST CT ABDOMEN AND PELVIS WITHOUT AND WITH CONTRAST 02/03/2024 12:54:51 AM TECHNIQUE: CTA of the chest was performed after the administration of intravenous contrast. Multiplanar reformatted images are provided for review. MIP images are provided for review. CT of the abdomen and pelvis was performed with the administration of intravenous contrast. Automated exposure control, iterative reconstruction, and/or weight based adjustment of the mA/kV was utilized to reduce the radiation dose to as low as reasonably achievable. COMPARISON: Chest radiograph 02/02/2024 and PET/CT 12/30/2023. CLINICAL HISTORY: Pulmonary embolism (PE) suspected, low to intermediate prob, positive D-dimer; SOB, abd pain, pos ddimer, hx meta CA. FINDINGS: CHEST: PULMONARY ARTERIES: Pulmonary arteries are adequately opacified for evaluation. No intraluminal filling defect to suggest pulmonary embolism. Main pulmonary artery is normal in caliber. MEDIASTINUM: Interval increase in size of a precarinal lymph node measuring 1.6 cm, previously 0.9 cm (series 8 image 159). Similar size of the enlarged subcarinal lymph node measuring 1.4 cm. Right hilar lymph node measuring 1.2 cm on series 8 image 187. Enlarged left hilar lymph node measuring 1.2 cm on series 8 image 161. Small pericardial effusion similar to prior. There is mild thickening of the pericardium suggesting inflammation or infection. LUNGS AND PLEURA: Debris fills the right lower lobe bronchus, suspicious for aspiration. No focal consolidation or pneumothorax. Trace left pleural effusion and atelectasis in the left lower lobe. SOFT TISSUES AND  BONES: No acute fracture or destructive osseous lesion in the chest. Chest wall port-a-cath on the right with tip in the right atrium. ABDOMEN AND PELVIS: LIVER: Stable hepatic cysts. GALLBLADDER AND BILE DUCTS: Gallbladder is unremarkable. No biliary ductal dilatation. SPLEEN: Spleen demonstrates no acute abnormality. PANCREAS: Pancreas demonstrates no acute abnormality. ADRENAL GLANDS: Adrenal glands demonstrate no acute abnormality. KIDNEYS, URETERS AND BLADDER: No stones in the kidneys or ureters. No hydronephrosis. No perinephric or periureteral stranding. Urinary bladder is unremarkable. GI AND BOWEL: Wall thickening of the distal esophagus near the gastroesophageal junction similar to prior. Thickening of the stomach wall with mucosal hyperenhancement. Diffuse colonic wall thickening and mucosal hyperenhancement suggestive of colitis. No bowel obstruction. REPRODUCTIVE: Reproductive organs are unremarkable. PERITONEUM AND RETROPERITONEUM: Small volume abdominal pelvic ascites. No free intraperitoneal air. Similar nodular thickening in the left upper quadrant along the peritoneum which demonstrated FDG avidity on 12/30/2023 (series 4 image 13). LYMPH NODES: Similar left paraaortic lymphadenopathy, for example a 1.5 x 2.0 cm node on series 6 image 86 is not substantially changed from 12/30/2023. BONES AND SOFT TISSUES: No acute abnormality of the visualized bones. No focal soft tissue abnormality. IMPRESSION: 1. No pulmonary embolism. 2. Wall thickening of the esophagus, stomach may be due to infection or inflammation. Infiltrative neoplasm not excluded. 3. Wall thickening of the colon compatible with infectious or inflammatory pancolitis. 4. Small left pleural effusion and atelectasis in the left lower lobe. 5. Increased size of a precarinal lymph  node. Re-demonstrated mediastinal, hilar, and retroperitoneal metastatic lymphadenopathy. 6. Small pericardial effusion and mild thickening of the pericardium  suggesting inflammation or infection. Electronically signed by: Norman Gatlin MD 02/03/2024 01:20 AM EDT RP Workstation: [id number]   DG Chest Portable 1 View Result Date: 02/02/2024 CLINICAL DATA:  Shortness of breath. EXAM: PORTABLE CHEST 1 VIEW COMPARISON:  October 03, 2023 FINDINGS: There is stable right-sided venous Port-A-Cath positioning. The heart size and mediastinal contours are within normal limits. Both lungs are clear. The visualized skeletal structures are unremarkable. IMPRESSION: No active disease. Electronically Signed   By: Suzen Dials M.D.   On: 02/02/2024 22:37   Addendum I have seen the patient, examined him. I agree with the assessment and and plan and have edited the notes.   Patient was admitted for severe worsening neuropathy, likely from his chemotherapy paclitaxel  and previous oxaliplatin .  Patient has been seen by palliative care Dr. [name] today, appreciate their assistance.  Will hold his chemo for now, and follow-up with Dr. Autumn after discharge.  All questions were answered.  Onita Mattock  02/05/2024

## 2024-02-05 NOTE — Progress Notes (Signed)
   02/05/24 1244  TOC Brief Assessment  Insurance and Status Reviewed  Patient has primary care physician Yes Brenita, Bascom RAMAN, NP)  Home environment has been reviewed From home with family  Prior level of function: Independent  Prior/Current Home Services No current home services  Social Drivers of Health Review SDOH reviewed no interventions necessary  Readmission risk has been reviewed Yes  Transition of care needs no transition of care needs at this time

## 2024-02-05 NOTE — Consult Note (Signed)
 Consultation Note Date: 02/05/2024   Patient Name: Jesus Haynes  DOB: September 28, 1975  MRN: [id number]  Age / Sex: 48 y.o., male  PCP: [name], NP Referring Physician: Lue Elsie BROCKS, MD  Reason for Consultation: Establishing goals of care  HPI/Patient Profile: 48 y.o. male admitted on 02/04/2024  Jesus Haynes is a 48 y.o. male with medical history significant of gastric adenocarcinoma   Presented with   severe paresthesia Patient presents with diffuse pain all over Some of her pain describes as pins-and-needles Similar secondary to new chemotherapeutic resulting in neuropathy Mostly in his extremities has history of gastric adenocarcinoma pain started on Sunday pain and particularly worse in his hands and feet somewhat improved with Dilaudid .  Clinical Assessment and Goals of Care: Palliative care consulted for goals of care discussions.   Chart reviewed Patient seen and examined Medication history also noted and pain regimen discussed with the patient.   Palliative medicine is specialized medical care for people living with serious illness. It focuses on providing relief from the symptoms and stress of a serious illness. The goal is to improve quality of life for both the patient and the family. Goals of care: Broad aims of medical therapy in relation to the patient's values and preferences. Our aim is to provide medical care aimed at enabling patients to achieve the goals that matter most to them, given the circumstances of their particular medical situation and their constraints.   Denies constipation Admits to mild nausea Admits to neuropathic pain causing him tachypnea sensation and mild anxiety.    NEXT OF KIN  Lives at home with the mother of his children Charleen Range is HCPOA agent - HCPOA form noted on chart.   SUMMARY OF RECOMMENDATIONS    Agree with DNR Increase PO PRN Oxy  IR from 5 mg to 10 mg, monitor pain control needs.  IV Dilaudid  PRN Agree with Gabapentin  Consider Anxiolytics if anxiety/tachypnea persists despite adequate analgesia.  He is also chronically on PO Methadone , continue at current dose and monitor for increase.  Recommend follow up with palliative care at the cancer center.  Code Status/Advance Care Planning: DNR   Symptom Management:     Palliative Prophylaxis:  Frequent Pain Assessment    Psycho-social/Spiritual:  Desire for further Chaplaincy support:yes Additional Recommendations: Caregiving  Support/Resources  Prognosis:  Unable to determine  Discharge Planning: To Be Determined      Primary Diagnoses: Present on Admission:  Hypokalemia  Adenocarcinoma of gastroesophageal junction (HCC)  Anemia due to chronic blood loss  Asthma  Cancer associated pain  Malnutrition of moderate degree  Tobacco abuse  Hypophosphatemia   I have reviewed the medical record, interviewed the patient and family, and examined the patient. The following aspects are pertinent.  Past Medical History:  Diagnosis Date   Asthma    Social History   Socioeconomic History   Marital status: Single    Spouse name: Not on file   Number of children: Not on file   Years of education:  Not on file   Highest education level: Not on file  Occupational History   Not on file  Tobacco Use   Smoking status: Former    Current packs/day: 0.00    Types: Cigarettes    Quit date: 12/26/2012    Years since quitting: 11.1   Smokeless tobacco: Never  Vaping Use   Vaping status: Former   Quit date: 08/21/2018   Substances: Nicotine  Substance and Sexual Activity   Alcohol use: No   Drug use: No   Sexual activity: Not Currently  Other Topics Concern   Not on file  Social History Narrative   Not on file   Social Drivers of Health   Financial Resource Strain: Not on file  Food Insecurity: No Food Insecurity (02/05/2024)   Hunger Vital Sign     Worried About Running Out of Food in the Last Year: Never true    Ran Out of Food in the Last Year: Never true  Transportation Needs: No Transportation Needs (02/05/2024)   PRAPARE - Administrator, Civil Service (Medical): No    Lack of Transportation (Non-Medical): No  Physical Activity: Not on file  Stress: Not on file  Social Connections: Unknown (07/30/2023)   Social Connection and Isolation Panel    Frequency of Communication with Friends and Family: More than three times a week    Frequency of Social Gatherings with Friends and Family: More than three times a week    Attends Religious Services: Patient declined    Database administrator or Organizations: No    Attends Engineer, structural: Never    Marital Status: Never married   Family History  Problem Relation Age of Onset   Heart failure Father    Scheduled Meds:  feeding supplement  237 mL Oral BID BM   folic acid   1 mg Oral Daily   gabapentin   300 mg Oral TID   methadone   5 mg Oral QPM   metoCLOPramide  (REGLAN ) injection  5 mg Intravenous Q12H   pantoprazole   40 mg Oral BID   thiamine  (VITAMIN B1) injection  100 mg Intravenous Daily   Continuous Infusions: PRN Meds:.acetaminophen  **OR** acetaminophen , albuterol , HYDROmorphone  (DILAUDID ) injection, ondansetron  **OR** ondansetron  (ZOFRAN ) IV, oxyCODONE  Medications Prior to Admission:  Prior to Admission medications   Medication Sig Start Date End Date Taking? Authorizing Provider  albuterol  (PROVENTIL ) (2.5 MG/3ML) 0.083% nebulizer solution Take 3 mLs (2.5 mg total) by nebulization every 6 (six) hours as needed for wheezing or shortness of breath. 08/07/23  Yes Pickenpack-Cousar, Athena N, NP  albuterol  (VENTOLIN  HFA) 108 (90 Base) MCG/ACT inhaler Inhale 1-2 puffs into the lungs every 4 (four) hours as needed for wheezing or shortness of breath. 01/31/24  Yes Pasam, Avinash, MD  cyanocobalamin  1000 MCG tablet Take 1 tablet (1,000 mcg total) by mouth daily.  10/14/23  Yes Pickenpack-Cousar, Fannie SAILOR, NP  dexamethasone  (DECADRON ) 4 MG tablet Take 2 tablets (8 mg total) by mouth daily. For 3 days after chemo 01/30/24  Yes Burton, Lacie K, NP  folic acid  (FOLVITE ) 1 MG tablet Take 1 tablet (1 mg total) by mouth daily. 10/14/23  Yes Pickenpack-Cousar, Fannie SAILOR, NP  gabapentin  (NEURONTIN ) 300 MG capsule Take 1 capsule (300 mg total) by mouth 3 (three) times daily for 30 doses. 02/04/24 02/15/24 Yes Trifan, Donnice PARAS, MD  HYDROmorphone  (DILAUDID ) 2 MG tablet Take 0.5 tablets (1 mg total) by mouth every 8 (eight) hours as needed for severe pain (pain score 7-10).  01/27/24  Yes Pickenpack-Cousar, Fannie SAILOR, NP  methadone  (DOLOPHINE ) 5 MG tablet Take 1 tablet (5 mg total) by mouth daily. Patient taking differently: Take 5 mg by mouth every evening. 01/02/24  Yes Pickenpack-Cousar, Fannie SAILOR, NP  metoCLOPramide  (REGLAN ) 10 MG tablet Take 1 tablet (10 mg total) by mouth 4 (four) times daily  before meals and at bedtime. Patient taking differently: Take 10 mg by mouth in the morning and at bedtime. 01/31/24  Yes Pickenpack-Cousar, Fannie SAILOR, NP  mirtazapine  (REMERON ) 7.5 MG tablet Take 1 tablet (7.5 mg total) by mouth at bedtime. 01/29/24  Yes Pickenpack-Cousar, Fannie SAILOR, NP  ondansetron  (ZOFRAN -ODT) 8 MG disintegrating tablet Dissolve 1 tablet (8 mg total) by mouth every 8 (eight) hours as needed for nausea or vomiting. Patient taking differently: Take 8 mg by mouth 2 (two) times daily. 10/02/23  Yes Pasam, Avinash, MD  pantoprazole  (PROTONIX ) 40 MG tablet Take 1 tablet (40 mg total) by mouth 2 (two) times daily. 01/02/24 03/08/24 Yes [name], NP  potassium chloride  (KLOR-CON ) 10 MEQ tablet Take 1 tablet (10 mEq total) by mouth daily for 20 doses. 02/04/24 02/25/24 Yes Trifan, Donnice PARAS, MD  potassium chloride  SA (KLOR-CON  M) 20 MEQ tablet Take 1 tablet (20 mEq total) by mouth 2 (two) times daily. Patient taking differently: Take 20 mEq by mouth 2 (two) times daily as needed.  10/31/23  Yes Pickenpack-Cousar, Fannie SAILOR, NP  prochlorperazine  (COMPAZINE ) 10 MG tablet Take 1 tablet (10 mg total) by mouth every 6 (six) hours as needed for nausea or vomiting. Patient taking differently: Take 10 mg by mouth every 6 (six) hours. 01/02/24  Yes Pasam, Avinash, MD  Albuterol  Sulfate 2.5 MG/0.5ML NEBU Use 1 vial in nebulizer as directed every 6 hours as needed for wheezing or shortness of breath Patient not taking: Reported on 02/03/2024 04/28/23   Vicky Charleston, PA-C  amoxicillin -clavulanate (AUGMENTIN ) 875-125 MG tablet Take 1 tablet by mouth every 12 (twelve) hours. 02/03/24   Henderly, Britni A, PA-C  dronabinol  (MARINOL ) 5 MG capsule Take 1 capsule (5 mg total) by mouth 2 (two) times daily before lunch and supper. Patient not taking: Reported on 02/03/2024 10/02/23   Pickenpack-Cousar, Athena N, NP  furosemide  (LASIX ) 20 MG tablet Take 1 tablet (20 mg total) by mouth daily as needed for fluid or edema. Patient not taking: Reported on 02/04/2024 08/07/23   Autumn Millman, MD   No Known Allergies Review of Systems Pins and needles sensation in finger tips and toes.   Physical Exam Awake alert No distress Pins and needles sensation in UE and LE No edema Appears thinly built Appears mildly anxious - states that uncontrolled pain makes him hyper ventilate  Vital Signs: BP 135/81 (BP Location: Right Arm)   Pulse (!) 55   Temp 98.1 F (36.7 C) (Oral)   Resp 16   Ht 5' 6 (1.676 m)   Wt 50.7 kg   SpO2 98%   BMI 18.04 kg/m  Pain Scale: 0-10   Pain Score: 7    SpO2: SpO2: 98 % O2 Device:SpO2: 98 % O2 Flow Rate: .   IO: Intake/output summary:  Intake/Output Summary (Last 24 hours) at 02/05/2024 1103 Last data filed at 02/05/2024 0758 Gross per 24 hour  Intake 44.8 ml  Output --  Net 44.8 ml    LBM:   Baseline Weight: Weight: 50.7 kg Most recent weight: Weight: 50.7 kg     Palliative Assessment/Data:   PPS 60%  Time In:  10  Time Out:  11 Time Total:  60  Greater  than 50%  of this time was spent counseling and coordinating care related to the above assessment and plan.  Signed by: Lonia Serve, MD   Please contact Palliative Medicine Team phone at [phone] for questions and concerns.  For individual provider: See Tracey

## 2024-02-05 NOTE — Plan of Care (Signed)

## 2024-02-06 ENCOUNTER — Telehealth: Payer: Self-pay

## 2024-02-06 ENCOUNTER — Encounter: Payer: Self-pay | Admitting: Oncology

## 2024-02-06 ENCOUNTER — Other Ambulatory Visit (HOSPITAL_COMMUNITY): Payer: Self-pay

## 2024-02-06 DIAGNOSIS — E876 Hypokalemia: Secondary | ICD-10-CM | POA: Diagnosis not present

## 2024-02-06 DIAGNOSIS — Z515 Encounter for palliative care: Secondary | ICD-10-CM | POA: Diagnosis not present

## 2024-02-06 LAB — CBC
HCT: 38.1 % — ABNORMAL LOW (ref 39.0–52.0)
Hemoglobin: 11.9 g/dL — ABNORMAL LOW (ref 13.0–17.0)
MCH: 26.4 pg (ref 26.0–34.0)
MCHC: 31.2 g/dL (ref 30.0–36.0)
MCV: 84.5 fL (ref 80.0–100.0)
Platelets: 162 K/uL (ref 150–400)
RBC: 4.51 MIL/uL (ref 4.22–5.81)
RDW: 15.4 % (ref 11.5–15.5)
WBC: 3.4 K/uL — ABNORMAL LOW (ref 4.0–10.5)
nRBC: 0 % (ref 0.0–0.2)

## 2024-02-06 LAB — CALCIUM, IONIZED: Calcium, Ionized, Serum: 5 mg/dL (ref 4.5–5.6)

## 2024-02-06 LAB — VITAMIN B12: Vitamin B-12: 1901 pg/mL — ABNORMAL HIGH (ref 180–914)

## 2024-02-06 MED ORDER — DULOXETINE HCL 20 MG PO CPEP
20.0000 mg | ORAL_CAPSULE | Freq: Every day | ORAL | 0 refills | Status: DC
  Filled 2024-02-06: qty 30, 30d supply, fill #0

## 2024-02-06 MED ORDER — HYDROMORPHONE HCL 2 MG PO TABS
1.0000 mg | ORAL_TABLET | ORAL | 0 refills | Status: AC | PRN
  Filled 2024-02-06 (×2): qty 30, 5d supply, fill #0

## 2024-02-06 MED ORDER — THIAMINE MONONITRATE 100 MG PO TABS
100.0000 mg | ORAL_TABLET | Freq: Every day | ORAL | Status: DC
  Administered 2024-02-06: 100 mg via ORAL
  Filled 2024-02-06: qty 1

## 2024-02-06 MED ORDER — VITAMIN B-1 100 MG PO TABS
100.0000 mg | ORAL_TABLET | Freq: Every day | ORAL | 0 refills | Status: DC
  Filled 2024-02-06: qty 30, 30d supply, fill #0

## 2024-02-06 MED ORDER — DULOXETINE HCL 20 MG PO CPEP
20.0000 mg | ORAL_CAPSULE | Freq: Every day | ORAL | Status: DC
  Administered 2024-02-06: 20 mg via ORAL
  Filled 2024-02-06: qty 1

## 2024-02-06 MED ORDER — HYDROMORPHONE HCL 2 MG PO TABS
1.0000 mg | ORAL_TABLET | ORAL | Status: DC | PRN
  Administered 2024-02-06: 2 mg via ORAL
  Filled 2024-02-06: qty 1

## 2024-02-06 NOTE — Plan of Care (Signed)
  Problem: Education: Goal: Knowledge of General Education information will improve Description: Including pain rating scale, medication(s)/side effects and non-pharmacologic comfort measures Outcome: Progressing   Problem: Clinical Measurements: Goal: Respiratory complications will improve Outcome: Progressing Goal: Cardiovascular complication will be avoided Outcome: Progressing   Problem: Activity: Goal: Risk for activity intolerance will decrease Outcome: Progressing   Problem: Nutrition: Goal: Adequate nutrition will be maintained Outcome: Progressing   Problem: Pain Managment: Goal: General experience of comfort will improve and/or be controlled Outcome: Progressing

## 2024-02-06 NOTE — Transitions of Care (Post Inpatient/ED Visit) (Signed)
   02/06/2024  Name: Jesus Haynes MRN: [id number] DOB: July 23, 1975  Today's TOC FU Call Status: Today's TOC FU Call Status:: Unsuccessful Call (2nd Attempt) Unsuccessful Call (2nd Attempt) Date: 02/06/24  Attempted to reach the patient regarding the most recent Inpatient/ED visit.  Follow Up Plan: Additional outreach attempts will be made to reach the patient to complete the Transitions of Care (Post Inpatient/ED visit) call.   Signature  [employer], [place]

## 2024-02-06 NOTE — Progress Notes (Signed)
 Daily Progress Note   Patient Name: Jesus Haynes       Date: 02/06/2024 DOB: May 25, 1976  Age: 48 y.o. MRN#: [id number] Attending Physician: Lue Elsie BROCKS, MD Primary Care Physician: [name], NP Admit Date: 02/04/2024  Reason for Consultation/Follow-up: Establishing goals of care, Non pain symptom management, and Pain control  Subjective:  Awake alert, pain and neuropathy symptoms still present but somewhat better, HCPOA mother in law Burnard also on the phone.   Length of Stay: 1  Current Medications: Scheduled Meds:   Chlorhexidine  Gluconate Cloth  6 each Topical Daily   DULoxetine   20 mg Oral Daily   feeding supplement  1 Container Oral TID BM   folic acid   1 mg Oral Daily   methadone   5 mg Oral QPM   metoCLOPramide  (REGLAN ) injection  5 mg Intravenous Q12H   pantoprazole   40 mg Oral BID   sodium chloride  flush  10-40 mL Intracatheter Q12H   thiamine   100 mg Oral Daily    Continuous Infusions:   PRN Meds: acetaminophen  **OR** acetaminophen , albuterol , HYDROmorphone , ondansetron  **OR** ondansetron  (ZOFRAN ) IV, sodium chloride  flush  Physical Exam         Appears thin and has muscle wasting Has port Regular work of breathing Less anxious than on 02-05-24 No edema Awake alert  Vital Signs: BP 130/87 (BP Location: Right Arm)   Pulse (!) 56   Temp 97.9 F (36.6 C) (Oral)   Resp 18   Ht 5' 6 (1.676 m)   Wt 50.7 kg   SpO2 98%   BMI 18.04 kg/m  SpO2: SpO2: 98 % O2 Device: O2 Device: Room Air O2 Flow Rate:    Intake/output summary:  Intake/Output Summary (Last 24 hours) at 02/06/2024 1025 Last data filed at 02/06/2024 1018 Gross per 24 hour  Intake 497 ml  Output --  Net 497 ml   LBM: Last BM Date : 02/03/24 Baseline Weight: Weight: 50.7 kg Most  recent weight: Weight: 50.7 kg       Palliative Assessment/Data:      Patient Active Problem List   Diagnosis Date Noted   Pain 02/05/2024   Protein-calorie malnutrition, severe 02/05/2024   Hypokalemia 02/04/2024   Tobacco abuse 02/04/2024   Hypophosphatemia 02/04/2024   Dislodged jejunostomy tube 11/01/2023   Weight loss 11/01/2023  Fatigue 10/11/2023   Leukopenia due to antineoplastic chemotherapy (HCC) 09/18/2023   Encounter for antineoplastic chemotherapy 08/21/2023   Pedal edema 08/21/2023   Acute on chronic blood loss anemia 07/30/2023   Hyponatremia 07/30/2023   Hypocalcemia 07/30/2023   Port-A-Cath in place 07/25/2023   Feeding Jejunostomy tube in place 06/28/2023   Goals of care, counseling/discussion 06/24/2023   Constipation 06/22/2023   Palliative care encounter 06/21/2023   High risk medication use 06/21/2023   Medication management 06/21/2023   Malnutrition of moderate degree 06/18/2023   Failure to thrive in adult 06/14/2023   Cancer associated pain 06/14/2023   Pre-syncope 06/14/2023   Asthma 06/14/2023   Adenocarcinoma of gastroesophageal junction (HCC) 05/29/2023   Melena 05/29/2023   Anemia due to chronic blood loss 05/29/2023   Upper GI bleed 05/27/2023    Palliative Care Assessment & Plan   Patient Profile:  Jesus Haynes is a 48 year old male patient with oncologic history significant for GEJ adenocarcinoma.  Patient admitted on 02/04/2024 due to complaints of numbness tingling and body pain.   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 Methadone  dose not increased due to prolonged QTcB of 629 ms, with concern for proarrhythmic  risk. Will initiate duloxetine  20 mg daily, compounded due to low BMI (18) and desire for cautious titration. Stop Gabapentin .  Evidence: CALGB [id number] trial (JAMA, 2013) showed:Duloxetine  significantly reduced pain in patients with painful CIPN from paclitaxel  and oxaliplatin .Greatest benefit in oxaliplatin -induced neuropathy. Aware of low but potential risk for serotonin syndrome when used concurrently with methadone . Patient and caregiver educated on early symptoms (e.g., tremor, confusion, clonus). Will monitor closely for tolerability, including GI upset, sedation, and reduced intake. Goal is improved neuropathic pain control and comfort. Follow up with oncology and palliative care as outpatient.   Code Status:    Code Status Orders  (From admission, onward)           Start     Ordered   02/04/24 2248  Do not attempt resuscitation (DNR)- Limited -Do Not Intubate (DNI)  Continuous       Question Answer Comment  If pulseless and not breathing No CPR or chest compressions.   In Pre-Arrest Conditions (Patient Is Breathing and Has A Pulse) Do not intubate. Provide all appropriate non-invasive medical interventions. Avoid ICU transfer unless indicated or required.   Consent: Discussion documented in EHR or advanced directives reviewed      02/04/24 2248           Code Status History     Date Active Date Inactive Code Status Order ID Comments User Context   07/30/2023 1205 08/02/2023 1645 Full Code [id number]  Celinda Alm Lot, MD ED   06/14/2023 1621 07/11/2023 2225 Full Code [id number]  Celinda Alm Lot, MD Inpatient   06/13/2023 1253 06/14/2023 0509 Full Code [id number]  Philip Cornet, MD HOV   05/27/2023 1516 05/30/2023 1651 Full Code [id number]  Zella Katha HERO, MD ED      Advance Directive Documentation    Flowsheet Row Most Recent Value  Type of Advance Directive Healthcare Power of Attorney  Pre-existing out of facility DNR order (yellow form or pink MOST form) --   MOST Form in Place? --    Prognosis:  Unable to determine  Discharge Planning: Home with Palliative Services  Care plan was discussed with patient and HCPOA  Thank you for allowing the Palliative Medicine Team to assist in the care of this patient. Mod MDM.      Greater than 50%  of this time was spent counseling and coordinating care related to the above assessment and plan.  Lonia Serve, MD  Please contact Palliative Medicine Team phone at [phone] for questions and concerns.

## 2024-02-06 NOTE — Discharge Summary (Signed)
 Physician Discharge Summary  Jesus Haynes FMW:[id number] DOB: 1976-03-17 DOA: 02/04/2024  PCP: [name], NP  Admit date: 02/04/2024 Discharge date: 02/06/2024  Admitted From: Home Disposition: Home  Recommendations for Outpatient Follow-up:  Follow up with PCP in 1-2 weeks Follow-up with oncology  Home Health: None Equipment/Devices: None  Discharge Condition: Stable CODE STATUS: DNR Diet recommendation: As tolerated  Brief/Interim Summary: Jesus Haynes is a 48 y.o. male with medical history significant of gastric adenocarcinoma who presents with worsening paresthesias and intractable pain in the setting of new chemotherapy.  Oncology consulted and hospitalist called for admission.  Patient mated as above with worsening paresthesias and pain in the setting of new chemotherapy, improving on new regimen including gabapentin , duloxetine  as well as increased Dilaudid  dosing.  Patient otherwise stable and agreeable for discharge home, remainder of comorbid conditions appear to be stable, medication changes as below.    Discharge Diagnoses:  Principal Problem:   Hypokalemia Active Problems:   Adenocarcinoma of gastroesophageal junction (HCC)   Anemia due to chronic blood loss   Cancer associated pain   Asthma   Malnutrition of moderate degree   Tobacco abuse   Hypophosphatemia   Pain   Protein-calorie malnutrition, severe  Acute intractable neuropathy/pain Adenocarcinoma of gastroesophageal junction (HCC) - Oncology consulted at intake, continue gabapentin  - Off IV narcotics -Continue scheduled methadone , as needed p.o. Dilaudid    Pancytopenia Secondary to above/treatment Hemoglobin near baseline White count and platelet low but stable   Asthma Chronic stable continue albuterol  as needed   Hypokalemia Repleted, within normal limits  Discharge Instructions  Discharge Instructions     Call MD for:  difficulty breathing, headache or visual disturbances    Complete by: As directed    Call MD for:  extreme fatigue   Complete by: As directed    Call MD for:  hives   Complete by: As directed    Call MD for:  persistant dizziness or light-headedness   Complete by: As directed    Call MD for:  persistant nausea and vomiting   Complete by: As directed    Call MD for:  severe uncontrolled pain   Complete by: As directed    Call MD for:  temperature >100.4   Complete by: As directed    Diet - low sodium heart healthy   Complete by: As directed    Increase activity slowly   Complete by: As directed       Allergies as of 02/06/2024   No Known Allergies      Medication List     STOP taking these medications    amoxicillin -clavulanate 875-125 MG tablet Commonly known as: AUGMENTIN    dexamethasone  4 MG tablet Commonly known as: DECADRON    dronabinol  5 MG capsule Commonly known as: MARINOL    furosemide  20 MG tablet Commonly known as: LASIX    metoCLOPramide  10 MG tablet Commonly known as: REGLAN    mirtazapine  7.5 MG tablet Commonly known as: REMERON    ondansetron  8 MG disintegrating tablet Commonly known as: ZOFRAN -ODT   potassium chloride  SA 20 MEQ tablet Commonly known as: KLOR-CON  M   prochlorperazine  10 MG tablet Commonly known as: COMPAZINE        TAKE these medications    albuterol  (2.5 MG/3ML) 0.083% nebulizer solution Commonly known as: PROVENTIL  Take 3 mLs (2.5 mg total) by nebulization every 6 (six) hours as needed for wheezing or shortness of breath. What changed: Another medication with the same name was removed. Continue taking this medication, and  follow the directions you see here.   albuterol  108 (90 Base) MCG/ACT inhaler Commonly known as: Ventolin  HFA Inhale 1-2 puffs into the lungs every 4 (four) hours as needed for wheezing or shortness of breath. What changed: Another medication with the same name was removed. Continue taking this medication, and follow the directions you see here.   B-12 1000  MCG Tabs Take 1 tablet (1,000 mcg total) by mouth daily.   DULoxetine  20 MG capsule Commonly known as: CYMBALTA  Take 1 capsule (20 mg total) by mouth daily. Start taking on: February 07, 2024   folic acid  1 MG tablet Commonly known as: FOLVITE  Take 1 tablet (1 mg total) by mouth daily.   gabapentin  300 MG capsule Commonly known as: Neurontin  Take 1 capsule (300 mg total) by mouth 3 (three) times daily for 30 doses.   HYDROmorphone  2 MG tablet Commonly known as: Dilaudid  Take 0.5-1 tablets (1-2 mg total) by mouth every 4 (four) hours as needed for up to 5 days for severe pain (pain score 7-10). What changed:  how much to take when to take this   methadone  5 MG tablet Commonly known as: DOLOPHINE  Take 1 tablet (5 mg total) by mouth daily. What changed: when to take this   pantoprazole  40 MG tablet Commonly known as: Protonix  Take 1 tablet (40 mg total) by mouth 2 (two) times daily.   potassium chloride  10 MEQ tablet Commonly known as: KLOR-CON  Take 1 tablet (10 mEq total) by mouth daily for 20 doses.   thiamine  100 MG tablet Commonly known as: Vitamin B-1 Take 1 tablet (100 mg total) by mouth daily. Start taking on: February 07, 2024        Follow-up Information     Go to  [facility] Emergency Department at [facility].   Specialty: Emergency Medicine Why: If symptoms worsen Contact information: [address] [place]  [address] [phone]        Autumn Millman, MD. Call in 1 day.   Specialty: Oncology Contact information: [address] [place] [address] [phone]                No Known Allergies  Consultations: Oncology  Procedures/Studies: CT Angio Chest PE W and/or Wo Contrast Result Date: 02/03/2024 EXAM: CTA CHEST PE WITHOUT AND WITH CONTRAST CT ABDOMEN AND PELVIS WITHOUT AND WITH CONTRAST 02/03/2024 12:54:51 AM TECHNIQUE: CTA of the chest was performed after the administration of intravenous  contrast. Multiplanar reformatted images are provided for review. MIP images are provided for review. CT of the abdomen and pelvis was performed with the administration of intravenous contrast. Automated exposure control, iterative reconstruction, and/or weight based adjustment of the mA/kV was utilized to reduce the radiation dose to as low as reasonably achievable. COMPARISON: Chest radiograph 02/02/2024 and PET/CT 12/30/2023. CLINICAL HISTORY: Pulmonary embolism (PE) suspected, low to intermediate prob, positive D-dimer; SOB, abd pain, pos ddimer, hx meta CA. FINDINGS: CHEST: PULMONARY ARTERIES: Pulmonary arteries are adequately opacified for evaluation. No intraluminal filling defect to suggest pulmonary embolism. Main pulmonary artery is normal in caliber. MEDIASTINUM: Interval increase in size of a precarinal lymph node measuring 1.6 cm, previously 0.9 cm (series 8 image 159). Similar size of the enlarged subcarinal lymph node measuring 1.4 cm. Right hilar lymph node measuring 1.2 cm on series 8 image 187. Enlarged left hilar lymph node measuring 1.2 cm on series 8 image 161. Small pericardial effusion similar to prior. There is mild thickening of the pericardium suggesting  inflammation or infection. LUNGS AND PLEURA: Debris fills the right lower lobe bronchus, suspicious for aspiration. No focal consolidation or pneumothorax. Trace left pleural effusion and atelectasis in the left lower lobe. SOFT TISSUES AND BONES: No acute fracture or destructive osseous lesion in the chest. Chest wall port-a-cath on the right with tip in the right atrium. ABDOMEN AND PELVIS: LIVER: Stable hepatic cysts. GALLBLADDER AND BILE DUCTS: Gallbladder is unremarkable. No biliary ductal dilatation. SPLEEN: Spleen demonstrates no acute abnormality. PANCREAS: Pancreas demonstrates no acute abnormality. ADRENAL GLANDS: Adrenal glands demonstrate no acute abnormality. KIDNEYS, URETERS AND BLADDER: No stones in the kidneys or ureters. No  hydronephrosis. No perinephric or periureteral stranding. Urinary bladder is unremarkable. GI AND BOWEL: Wall thickening of the distal esophagus near the gastroesophageal junction similar to prior. Thickening of the stomach wall with mucosal hyperenhancement. Diffuse colonic wall thickening and mucosal hyperenhancement suggestive of colitis. No bowel obstruction. REPRODUCTIVE: Reproductive organs are unremarkable. PERITONEUM AND RETROPERITONEUM: Small volume abdominal pelvic ascites. No free intraperitoneal air. Similar nodular thickening in the left upper quadrant along the peritoneum which demonstrated FDG avidity on 12/30/2023 (series 4 image 13). LYMPH NODES: Similar left paraaortic lymphadenopathy, for example a 1.5 x 2.0 cm node on series 6 image 86 is not substantially changed from 12/30/2023. BONES AND SOFT TISSUES: No acute abnormality of the visualized bones. No focal soft tissue abnormality. IMPRESSION: 1. No pulmonary embolism. 2. Wall thickening of the esophagus, stomach may be due to infection or inflammation. Infiltrative neoplasm not excluded. 3. Wall thickening of the colon compatible with infectious or inflammatory pancolitis. 4. Small left pleural effusion and atelectasis in the left lower lobe. 5. Increased size of a precarinal lymph node. Re-demonstrated mediastinal, hilar, and retroperitoneal metastatic lymphadenopathy. 6. Small pericardial effusion and mild thickening of the pericardium suggesting inflammation or infection. Electronically signed by: Norman Gatlin MD 02/03/2024 01:20 AM EDT RP Workstation: [id number]   CT ABDOMEN PELVIS W CONTRAST Result Date: 02/03/2024 EXAM: CTA CHEST PE WITHOUT AND WITH CONTRAST CT ABDOMEN AND PELVIS WITHOUT AND WITH CONTRAST 02/03/2024 12:54:51 AM TECHNIQUE: CTA of the chest was performed after the administration of intravenous contrast. Multiplanar reformatted images are provided for review. MIP images are provided for review. CT of the abdomen and  pelvis was performed with the administration of intravenous contrast. Automated exposure control, iterative reconstruction, and/or weight based adjustment of the mA/kV was utilized to reduce the radiation dose to as low as reasonably achievable. COMPARISON: Chest radiograph 02/02/2024 and PET/CT 12/30/2023. CLINICAL HISTORY: Pulmonary embolism (PE) suspected, low to intermediate prob, positive D-dimer; SOB, abd pain, pos ddimer, hx meta CA. FINDINGS: CHEST: PULMONARY ARTERIES: Pulmonary arteries are adequately opacified for evaluation. No intraluminal filling defect to suggest pulmonary embolism. Main pulmonary artery is normal in caliber. MEDIASTINUM: Interval increase in size of a precarinal lymph node measuring 1.6 cm, previously 0.9 cm (series 8 image 159). Similar size of the enlarged subcarinal lymph node measuring 1.4 cm. Right hilar lymph node measuring 1.2 cm on series 8 image 187. Enlarged left hilar lymph node measuring 1.2 cm on series 8 image 161. Small pericardial effusion similar to prior. There is mild thickening of the pericardium suggesting inflammation or infection. LUNGS AND PLEURA: Debris fills the right lower lobe bronchus, suspicious for aspiration. No focal consolidation or pneumothorax. Trace left pleural effusion and atelectasis in the left lower lobe. SOFT TISSUES AND BONES: No acute fracture or destructive osseous lesion in the chest. Chest wall port-a-cath on the right with tip in the right atrium.  ABDOMEN AND PELVIS: LIVER: Stable hepatic cysts. GALLBLADDER AND BILE DUCTS: Gallbladder is unremarkable. No biliary ductal dilatation. SPLEEN: Spleen demonstrates no acute abnormality. PANCREAS: Pancreas demonstrates no acute abnormality. ADRENAL GLANDS: Adrenal glands demonstrate no acute abnormality. KIDNEYS, URETERS AND BLADDER: No stones in the kidneys or ureters. No hydronephrosis. No perinephric or periureteral stranding. Urinary bladder is unremarkable. GI AND BOWEL: Wall thickening of  the distal esophagus near the gastroesophageal junction similar to prior. Thickening of the stomach wall with mucosal hyperenhancement. Diffuse colonic wall thickening and mucosal hyperenhancement suggestive of colitis. No bowel obstruction. REPRODUCTIVE: Reproductive organs are unremarkable. PERITONEUM AND RETROPERITONEUM: Small volume abdominal pelvic ascites. No free intraperitoneal air. Similar nodular thickening in the left upper quadrant along the peritoneum which demonstrated FDG avidity on 12/30/2023 (series 4 image 13). LYMPH NODES: Similar left paraaortic lymphadenopathy, for example a 1.5 x 2.0 cm node on series 6 image 86 is not substantially changed from 12/30/2023. BONES AND SOFT TISSUES: No acute abnormality of the visualized bones. No focal soft tissue abnormality. IMPRESSION: 1. No pulmonary embolism. 2. Wall thickening of the esophagus, stomach may be due to infection or inflammation. Infiltrative neoplasm not excluded. 3. Wall thickening of the colon compatible with infectious or inflammatory pancolitis. 4. Small left pleural effusion and atelectasis in the left lower lobe. 5. Increased size of a precarinal lymph node. Re-demonstrated mediastinal, hilar, and retroperitoneal metastatic lymphadenopathy. 6. Small pericardial effusion and mild thickening of the pericardium suggesting inflammation or infection. Electronically signed by: Norman Gatlin MD 02/03/2024 01:20 AM EDT RP Workstation: [id number]   DG Chest Portable 1 View Result Date: 02/02/2024 CLINICAL DATA:  Shortness of breath. EXAM: PORTABLE CHEST 1 VIEW COMPARISON:  October 03, 2023 FINDINGS: There is stable right-sided venous Port-A-Cath positioning. The heart size and mediastinal contours are within normal limits. Both lungs are clear. The visualized skeletal structures are unremarkable. IMPRESSION: No active disease. Electronically Signed   By: Suzen Dials M.D.   On: 02/02/2024 22:37     Subjective: No acute issues or events  overnight, pain currently well-controlled   Discharge Exam: Vitals:   02/05/24 2019 02/06/24 0516  BP: (!) 148/96 130/87  Pulse: (!) 59 (!) 56  Resp: 18 18  Temp: 98.3 F (36.8 C) 97.9 F (36.6 C)  SpO2: 98% 98%   Vitals:   02/05/24 0847 02/05/24 1439 02/05/24 2019 02/06/24 0516  BP: 135/81 (!) 134/94 (!) 148/96 130/87  Pulse: (!) 55 (!) 57 (!) 59 (!) 56  Resp: 16 16 18 18   Temp: 98.1 F (36.7 C) 98.1 F (36.7 C) 98.3 F (36.8 C) 97.9 F (36.6 C)  TempSrc: Oral Oral Oral Oral  SpO2: 98% 99% 98% 98%  Weight:      Height:        General: Pt is alert, awake, not in acute distress Cardiovascular: RRR, S1/S2 +, no rubs, no gallops Respiratory: CTA bilaterally, no wheezing, no rhonchi Abdominal: Soft, NT, ND, bowel sounds + Extremities: no edema, no cyanosis    The results of significant diagnostics from this hospitalization (including imaging, microbiology, ancillary and laboratory) are listed below for reference.     Microbiology: No results found for this or any previous visit (from the past 240 hours).   Labs: BNP (last 3 results) No results for input(s): BNP in the last 8760 hours. Basic Metabolic Panel: Recent Labs  Lab 02/02/24 2301 02/04/24 1711 02/05/24 0555  NA 133* 134* 134*  K 3.0* 2.8* 3.7  CL 96* 101 101  CO2 21*  24 24  GLUCOSE 122* 84 86  BUN 13 9 6   CREATININE 0.56* 0.42* <0.30*  CALCIUM  8.5* 7.8* 8.2*  MG 2.2 2.1 2.0  PHOS  --  1.6* 3.2   Liver Function Tests: Recent Labs  Lab 02/02/24 2301 02/05/24 0555  AST 25 17  ALT 18 16  ALKPHOS 46 39  BILITOT 1.1 0.7  PROT 6.0* 5.1*  ALBUMIN 3.4* 2.7*   Recent Labs  Lab 02/02/24 2301  LIPASE 20   No results for input(s): AMMONIA in the last 168 hours. CBC: Recent Labs  Lab 02/02/24 2301 02/04/24 1711 02/05/24 0555 02/06/24 0500  WBC 5.8 2.7* 2.2* 3.4*  NEUTROABS 4.2  --   --   --   HGB 12.1* 11.1* 10.6* 11.9*  HCT 37.9* 34.6* 34.4* 38.1*  MCV 83.3 83.8 84.9 84.5  PLT  207 144* 136* 162   Cardiac Enzymes: No results for input(s): CKTOTAL, CKMB, CKMBINDEX, TROPONINI in the last 168 hours. BNP: Invalid input(s): POCBNP CBG: No results for input(s): GLUCAP in the last 168 hours. D-Dimer No results for input(s): DDIMER in the last 72 hours. Hgb A1c No results for input(s): HGBA1C in the last 72 hours. Lipid Profile No results for input(s): CHOL, HDL, LDLCALC, TRIG, CHOLHDL, LDLDIRECT in the last 72 hours. Thyroid  function studies No results for input(s): TSH, T4TOTAL, T3FREE, THYROIDAB in the last 72 hours.  Invalid input(s): FREET3 Anemia work up Recent Labs    02/06/24 0500  VITAMINB12 1,901*   Urinalysis    Component Value Date/Time   COLORURINE STRAW (A) 10/03/2023 1710   APPEARANCEUR CLEAR 10/03/2023 1710   LABSPEC 1.003 (L) 10/03/2023 1710   PHURINE 6.0 10/03/2023 1710   GLUCOSEU NEGATIVE 10/03/2023 1710   HGBUR NEGATIVE 10/03/2023 1710   BILIRUBINUR NEGATIVE 10/03/2023 1710   KETONESUR NEGATIVE 10/03/2023 1710   PROTEINUR NEGATIVE 01/15/2024 0840   NITRITE NEGATIVE 10/03/2023 1710   LEUKOCYTESUR NEGATIVE 10/03/2023 1710   Sepsis Labs Recent Labs  Lab 02/02/24 2301 02/04/24 1711 02/05/24 0555 02/06/24 0500  WBC 5.8 2.7* 2.2* 3.4*   Microbiology No results found for this or any previous visit (from the past 240 hours).   Time coordinating discharge: Over 30 minutes  SIGNED:   Elsie JAYSON [place], DO [facility] 02/06/2024, 1:56 PM Pager   If 7PM-7AM, please contact night-coverage [email or web address]

## 2024-02-07 ENCOUNTER — Encounter: Payer: Self-pay | Admitting: Oncology

## 2024-02-07 ENCOUNTER — Other Ambulatory Visit (HOSPITAL_COMMUNITY): Payer: Self-pay

## 2024-02-07 LAB — VITAMIN B1: Vitamin B1 (Thiamine): 176 nmol/L (ref 66.5–200.0)

## 2024-02-10 ENCOUNTER — Other Ambulatory Visit (HOSPITAL_COMMUNITY): Payer: Self-pay

## 2024-02-11 MED FILL — Fosaprepitant Dimeglumine For IV Infusion 150 MG (Base Eq): INTRAVENOUS | Qty: 5 | Status: AC

## 2024-02-12 ENCOUNTER — Inpatient Hospital Stay (HOSPITAL_BASED_OUTPATIENT_CLINIC_OR_DEPARTMENT_OTHER): Admitting: Oncology

## 2024-02-12 ENCOUNTER — Encounter: Payer: Self-pay | Admitting: Nurse Practitioner

## 2024-02-12 ENCOUNTER — Inpatient Hospital Stay: Admitting: Dietician

## 2024-02-12 ENCOUNTER — Inpatient Hospital Stay: Attending: Internal Medicine

## 2024-02-12 ENCOUNTER — Inpatient Hospital Stay

## 2024-02-12 ENCOUNTER — Other Ambulatory Visit: Payer: Self-pay

## 2024-02-12 ENCOUNTER — Other Ambulatory Visit (HOSPITAL_COMMUNITY): Payer: Self-pay

## 2024-02-12 ENCOUNTER — Encounter: Payer: Self-pay | Admitting: Oncology

## 2024-02-12 ENCOUNTER — Inpatient Hospital Stay: Admitting: Nurse Practitioner

## 2024-02-12 VITALS — BP 123/90 | HR 68 | Temp 98.4°F | Resp 16 | Ht 66.0 in | Wt 114.7 lb

## 2024-02-12 DIAGNOSIS — Z7189 Other specified counseling: Secondary | ICD-10-CM

## 2024-02-12 DIAGNOSIS — R630 Anorexia: Secondary | ICD-10-CM

## 2024-02-12 DIAGNOSIS — Z79899 Other long term (current) drug therapy: Secondary | ICD-10-CM | POA: Insufficient documentation

## 2024-02-12 DIAGNOSIS — E46 Unspecified protein-calorie malnutrition: Secondary | ICD-10-CM | POA: Diagnosis not present

## 2024-02-12 DIAGNOSIS — G893 Neoplasm related pain (acute) (chronic): Secondary | ICD-10-CM

## 2024-02-12 DIAGNOSIS — Z95828 Presence of other vascular implants and grafts: Secondary | ICD-10-CM

## 2024-02-12 DIAGNOSIS — C160 Malignant neoplasm of cardia: Secondary | ICD-10-CM

## 2024-02-12 DIAGNOSIS — M792 Neuralgia and neuritis, unspecified: Secondary | ICD-10-CM

## 2024-02-12 DIAGNOSIS — G4709 Other insomnia: Secondary | ICD-10-CM | POA: Diagnosis not present

## 2024-02-12 DIAGNOSIS — Z515 Encounter for palliative care: Secondary | ICD-10-CM | POA: Diagnosis not present

## 2024-02-12 DIAGNOSIS — R634 Abnormal weight loss: Secondary | ICD-10-CM

## 2024-02-12 DIAGNOSIS — R530 Neoplastic (malignant) related fatigue: Secondary | ICD-10-CM

## 2024-02-12 LAB — CMP (CANCER CENTER ONLY)
ALT: 10 U/L (ref 0–44)
AST: 13 U/L — ABNORMAL LOW (ref 15–41)
Albumin: 3.3 g/dL — ABNORMAL LOW (ref 3.5–5.0)
Alkaline Phosphatase: 67 U/L (ref 38–126)
Anion gap: 3 — ABNORMAL LOW (ref 5–15)
BUN: 9 mg/dL (ref 6–20)
CO2: 28 mmol/L (ref 22–32)
Calcium: 8.6 mg/dL — ABNORMAL LOW (ref 8.9–10.3)
Chloride: 105 mmol/L (ref 98–111)
Creatinine: 0.51 mg/dL — ABNORMAL LOW (ref 0.61–1.24)
GFR, Estimated: >60 mL/min (ref >60)
Glucose, Bld: 94 mg/dL (ref 70–99)
Potassium: 4.2 mmol/L (ref 3.5–5.1)
Sodium: 136 mmol/L (ref 135–145)
Total Bilirubin: 0.5 mg/dL (ref 0.0–1.2)
Total Protein: 5.7 g/dL — ABNORMAL LOW (ref 6.5–8.1)

## 2024-02-12 LAB — CBC WITH DIFFERENTIAL (CANCER CENTER ONLY)
Abs Immature Granulocytes: 0.01 K/uL (ref 0.00–0.07)
Basophils Absolute: 0 K/uL (ref 0.0–0.1)
Basophils Relative: 1 %
Eosinophils Absolute: 0.2 K/uL (ref 0.0–0.5)
Eosinophils Relative: 8 %
HCT: 36.3 % — ABNORMAL LOW (ref 39.0–52.0)
Hemoglobin: 11.6 g/dL — ABNORMAL LOW (ref 13.0–17.0)
Immature Granulocytes: 0 %
Lymphocytes Relative: 22 %
Lymphs Abs: 0.6 K/uL — ABNORMAL LOW (ref 0.7–4.0)
MCH: 26.3 pg (ref 26.0–34.0)
MCHC: 32 g/dL (ref 30.0–36.0)
MCV: 82.3 fL (ref 80.0–100.0)
Monocytes Absolute: 0.4 K/uL (ref 0.1–1.0)
Monocytes Relative: 16 %
Neutro Abs: 1.3 K/uL — ABNORMAL LOW (ref 1.7–7.7)
Neutrophils Relative %: 53 %
Platelet Count: 186 K/uL (ref 150–400)
RBC: 4.41 MIL/uL (ref 4.22–5.81)
RDW: 15.9 % — ABNORMAL HIGH (ref 11.5–15.5)
WBC Count: 2.5 K/uL — ABNORMAL LOW (ref 4.0–10.5)
nRBC: 0 % (ref 0.0–0.2)

## 2024-02-12 MED ORDER — SODIUM CHLORIDE 0.9% FLUSH
10.0000 mL | Freq: Once | INTRAVENOUS | Status: AC
  Administered 2024-02-12 (×2): 10 mL

## 2024-02-12 MED ORDER — AMOXICILLIN-POT CLAVULANATE 875-125 MG PO TABS
1.0000 | ORAL_TABLET | Freq: Two times a day (BID) | ORAL | 0 refills | Status: DC
  Filled 2024-02-12 (×2): qty 20, 10d supply, fill #0

## 2024-02-12 MED ORDER — TEMAZEPAM 15 MG PO CAPS
15.0000 mg | ORAL_CAPSULE | Freq: Every evening | ORAL | 0 refills | Status: AC | PRN
  Filled 2024-02-12: qty 8, 8d supply, fill #0
  Filled 2024-02-12: qty 30, 30d supply, fill #0
  Filled 2024-02-12: qty 22, 22d supply, fill #0
  Filled 2024-02-12: qty 30, 30d supply, fill #0

## 2024-02-12 MED ORDER — HYDROMORPHONE HCL 2 MG PO TABS
2.0000 mg | ORAL_TABLET | ORAL | 0 refills | Status: DC | PRN
  Filled 2024-02-18: qty 45, 8d supply, fill #0
  Filled ????-??-?? (×3): fill #0

## 2024-02-12 MED ORDER — SODIUM CHLORIDE 0.9% FLUSH
10.0000 mL | Freq: Once | INTRAVENOUS | Status: AC
  Administered 2024-02-12 (×2): 10 mL via INTRAVENOUS

## 2024-02-12 NOTE — Progress Notes (Signed)
 [facility]  ONCOLOGY CLINIC PROGRESS NOTE   Patient Care Team: [name], NP as PCP - General (Pulmonary Disease) Autumn Millman, MD as Consulting Physician (Oncology) Rosalie Kitchens, MD as Consulting Physician (Gastroenterology) Pickenpack-Cousar, Fannie SAILOR, NP as Nurse Practitioner [facility] and Palliative Medicine)  PATIENT NAME: Jesus Haynes   MR#: [id number] DOB: 05/14/1976  Date of visit: 02/12/2024   ASSESSMENT & PLAN:   Jesus Haynes is a 48 y.o. pleasant gentleman with history of asthma, presented to the ED on 05/27/2023 with complaints of epigastric burning abdominal pain, nausea, retching.  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  HER2/neu negative.  MMR intact.  NGS testing showed no actionable mutations.  Adenocarcinoma of gastroesophageal junction (HCC) -Please review HPI/oncology history for additional details and timeline of events.  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  Discussed treatment options. All treatment options are palliative in nature and not curative intent. Plan made to proceed with palliative systemic treatment using FOLFOX plus nivolumab .   -He started palliative systemic treatments with FOLFOX plus nivolumab  from 07/23/2023.  He was admitted to the hospital on 07/30/2023 after he presented with chest and epigastric pain, episodes of melena. He was found to have hemoglobin of 6.8. He was admitted for blood transfusion and further evaluation.  GI deferred additional workup/EGD since the cause of blood loss seemed obvious from malignancy. He was discharged home on 08/02/2023.   -He resumed systemic treatments with cycle 2 of FOLFOX plus nivolumab  on 08/07/2023 without 5-FU bolus and a 20% dose  reduction of oxaliplatin  and tolerated the chemotherapy and immunotherapy reasonably well.  Did not experience major side effects.  Recent lower extremity ultrasound to rule out DVT showed possible inguinal lymphadenopathy.  Hence PET/CT was obtained for further evaluation on 09/10/2023.  It showed significant improvement with decreased size and metabolic some of the gastric mass.  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 Hence plan made to continue current management with dose reduced FOLFOX and nivolumab  for up to 6 cycles followed by maintenance treatments.  He received cycle 6 of FOLFOX plus nivolumab  on 11/06/2023. Following cycle 6, plan was to transition him to maintenance treatments with 5-FU and nivolumab , to be continued every 2 weeks.  However because of fatigue, progressive weight loss, we could only use nivolumab  treatments.  Because of progressive weight loss, we obtained restaging scan sooner.  On 12/30/2023, restaging PET scan showed evidence of mild disease progression with uptake in size and number of pathologic lymph nodes in the bilateral lung hilum, mediastinum and retroperitoneum.  Subtle peritoneal focus of carcinomatosis was noted.  Groundglass hazy opacity in the lungs in the right lower lobe and left upper lobe with some low-level uptake.  Since patient could not tolerate FOLFOX plus nivolumab  because of side effects, plan made to change regimen to ramucirumab  plus paclitaxel  on days 1, 8, 15 of 28-day cycle. In the future, we could reconsider FOLFOX as needed.  Discussed side effect profile including risk of thrombosis, wound healing issues, bleeding issues, hypertension, neuropathy, nausea, vomiting, fatigue, altered taste, infusion reactions etc.  Patient verbalized understanding and was willing to proceed with this regimen.  Treatment was switched to ramucirumab  plus paclitaxel  starting from 01/15/2024.  Patient has been having difficult time with the current  regimen, especially  with neuropathy, hair loss, fatigue, decreased appetite and weight loss.  He was recently hospitalized for supportive care.  He was also found to have some aspiration issues.  He would like to take a break for 2 more weeks before reconsidering whether he can continue with current regimen or not.  We will dose reduce chemotherapy and also change intervals to days 1, 15 of 28-day cycle.  - Monitor weight and nutritional intake, encourage protein and calorie intake with supplements like Boost or Ensure.  Assessment & Plan  Chemotherapy-induced peripheral neuropathy Peripheral neuropathy likely due to Taxol , a component of the second-line chemotherapy regimen. Symptoms include numbness and neuropathy, contributing to treatment intolerance. - Consider reducing chemotherapy dosage and frequency to mitigate neuropathy symptoms.  Chemotherapy-induced alopecia Alopecia noted as a side effect of the current chemotherapy regimen, specifically Taxol . Hair loss observed with the second-line chemotherapy. - Consider reducing chemotherapy dosage and frequency to potentially reduce alopecia.  Progressive unintentional weight loss and malnutrition Significant weight loss of 12 pounds over the last three weeks, likely due to chemotherapy side effects and reduced oral intake. Emphasis on nutritional support to regain weight and strength. - Encourage increased protein and calorie intake with nutritional supplements like Boost or Ensure. - Monitor weight and nutritional status closely.  Aspiration event with risk for aspiration pneumonia Aspiration event with debris in the right lower lobe on CT scan. No current signs of infection, but risk for aspiration pneumonia exists. Decision to initiate prophylactic antibiotics to prevent infection. - Prescribe Augmentin  twice daily for 10 days to prevent aspiration pneumonia. - Monitor for signs of infection such as cough, fever, or chills.  Colitis,  esophagitis, and gastritis Colitis, esophagitis, and gastritis observed on CT scan with inflammation noted in the colon, esophagus, and stomach. No significant changes from previous imaging. Symptoms may be exacerbated by chemotherapy.   I reviewed lab results and outside records for this visit and discussed relevant results with the patient. Diagnosis, plan of care and treatment options were also discussed in detail with the patient. Opportunity provided to ask questions and answers provided to his apparent satisfaction. Provided instructions to call our clinic with any problems, questions or concerns prior to return visit. I recommended to continue follow-up with PCP and sub-specialists. He verbalized understanding and agreed with the plan.   NCCN guidelines have been consulted in the planning of this patient's care.  I spent a total of 40 minutes during this encounter with the patient including review of chart and various tests results, discussions about plan of care and coordination of care plan.   Chinita Patten, MD  02/13/2024 2:40 PM  [facility] - A DEPT OF [facility] 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  NGS testing showed no actionable mutations.  Current Treatment: Palliative systemic treatments with FOLFOX plus nivolumab , started from 07/23/2023.  After completing 6 cycles of FOLFOX + nivolumab , transitioned to maintenance treatments with 5-FU plus nivolumab  from 11/20/2023.  Because of disease progression, treatment switched to ramucirumab  plus paclitaxel  from 01/15/2024.  INTERVAL HISTORY:   Discussed the use of AI scribe software for clinical note transcription with the patient, who gave verbal consent to proceed.  History of  Present Illness  Jesus Haynes is a 48 year old  male with cancer who presents with weight loss and neuropathy following chemotherapy.  He has experienced significant weight loss, dropping from 126 pounds to 114 pounds over the past three weeks. This weight loss is attributed to a decreased ability to eat, compounded by frequent office visits. He has been trying to consume more calories, including eating crab legs and using Boost nutritional drinks, although he finds them too thick and sometimes dilutes them with water .  He was recently hospitalized due to pain and neuropathy following his third chemotherapy treatment. The neuropathy began after the third treatment, which was a reduced dose of a new chemotherapy regimen. He has also experienced hair loss, which led to shaving his head after pulling out chunks of hair.  During his hospital stay, he was initially diagnosed with an allergic reaction to crab legs, which was later reconsidered as aspiration of crab legs. A CT scan showed a small amount of fluid around the left lung and signs of aspiration in the right lower lobe. The scan also revealed colitis and thickening of the esophagus and stomach.  He has been dealing with cancer for several years, with symptoms predating his diagnosis by four to five years. He has a history of GERD and has been diagnosed with gastritis and esophagitis, which may have contributed to his cancer development.  He reports difficulty sleeping, and his current sleep medication is not effective. He has been off chemotherapy for two weeks, which has slightly improved his appetite. No fever, chills, or productive cough, although he does have a cough.  He has been on Dilaudid  for pain management, but there have been inconsistencies in its administration during his hospital stay. Palliative care has been involved to ensure consistent pain management.    I have reviewed the past medical history, past surgical history, social  history and family history with the patient and they are unchanged from previous note.  HISTORY OF PRESENT ILLNESS:   Oncology History  Adenocarcinoma of gastroesophageal junction (HCC)  05/29/2023 Initial Diagnosis   Adenocarcinoma of gastroesophageal junction (HCC)   07/18/2023 Cancer Staging   Staging form: Esophagus - Adenocarcinoma, AJCC 8th Edition - Clinical: Stage IVB (cT4, cN3, cM1, G3) - Signed by Autumn Millman, MD on 07/18/2023 Histologic grading system: 3 grade system   07/23/2023 - 01/02/2024 Chemotherapy   Patient is on Treatment Plan : GASTROESOPHAGEAL FOLFOX + Nivolumab  q14d     01/15/2024 -  Chemotherapy   Patient is on Treatment Plan : GASTROESOPHAGEAL Ramucirumab  D1, 15 + Paclitaxel  D1,8,15 q28d       48 y.o. gentleman with history of asthma, presented to the ED on 05/27/2023 with complaints of epigastric burning abdominal pain, nausea, retching.  He also reported some dark tarry stools for 3 days prior to arrival.  He was previously seen in the ED at [facility] on 05/08/2023 with complaints of chest or right upper quadrant abdominal pain.  Workup was unremarkable at that time and he was sent home with Protonix  and Carafate .  Since his symptoms persisted, he presented to the ED again.   In the ED, his hemoglobin was noted to be down to 10, compared to 13 previously.  With concern for upper GI bleed, was admitted for further evaluation and management.   Dr. Magod performed upper GI endoscopy on 05/28/2023.  It showed partially obstructing, likely malignant esophageal tumor in the lower third of the esophagus.  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  Normal duodenum.  Pathology from GE junction mass came  back positive for invasive adenocarcinoma, moderately to poorly differentiated.  MMR intact.  HER2/neu negative.  Guardant 360 NGS testing showed TP53 [id number] mutation.  No evidence of MSI-high status.  Low TMB (4.75 mut/Mb).  Overall no actionable mutations.   CT chest  abdomen pelvis on 05/28/2023 showed abnormal wall thickening at the GE junction, compatible with patient's known carcinoma.  Prominent lymph nodes in the retrocrural region on the right side, adjacent to the hiatal hernia.  Abnormal low-density soft tissue in the gastrohepatic region, abutting the lesser curvature and proximal stomach, worrisome for metastatic disease.  Periportal, periceliac, gastrosplenic and left retroperitoneal lymphadenopathy, worrisome for metastatic involvement.  Hypodensities in the liver were also noted, worrisome for metastatic disease, largest 1 measuring 13 mm in the inferior right lobe.  Fat stranding surrounding the body and tail of pancreas, suggesting acute pancreatitis.   We were consulted during his hospitalization on 05/29/2023 for additional recommendations given new diagnosis of GE junction adenocarcinoma.   On 06/07/2023, staging PET/CT showed large hypermetabolic mass involving the distal esophagus and proximal stomach consistent with known primary esophageal carcinoma.  Multiple hypermetabolic retroperitoneal, lower mediastinal lymph nodes consistent with locally advanced disease.  Suspected direct tumor extension into the pancreatic body.  Hypermetabolic node or peritoneal implant within the omentum.  No other evidence of metastatic disease.   Plan was for palliative systemic treatments with FOLFOX plus nivolumab .  However patient's performance status continued to decline and he had to be hospitalized again on 06/14/2023.  He finally presented to reestablish care in our clinic on 07/18/2023.  His performance status improved with nutrition via J-tube.  Started systemic treatments from 07/23/2023.  We did dose reduce oxaliplatin  by 20% and proceeded with 68 mg/m dose.  When he presented for cycle 2 on 08/07/2023, leukopenia with white count of 3300, ANC 1100.  We started skipping 5-FU bolus and continue with the dose reduced oxaliplatin .  Restaging PET scan on  09/10/2023 showed excellent response with significant reduction in size and metabolism in the gastric mass.  Resolution of peritoneal metastatic disease.   Completed 6 cycles of FOLFOX plus nivolumab  as of 11/06/2023.  Transitioned to maintenance treatments with 5-FU plus nivolumab  from 11/20/2023.  Because of progressive weight loss, we obtained restaging scan sooner.  On 12/30/2023, restaging PET scan showed evidence of mild disease progression with uptake in size and number of pathologic lymph nodes in the bilateral lung hilum, mediastinum and retroperitoneum.  Subtle peritoneal focus of carcinomatosis was noted.  Groundglass hazy opacity in the lungs in the right lower lobe and left upper lobe with some low-level uptake.  Since patient could not tolerate FOLFOX plus nivolumab  because of side effects, plan made to change regimen to ramucirumab  plus paclitaxel  on days 1, 8, 15 of 28-day cycle.  In the future, we could reconsider FOLFOX as needed.  REVIEW OF SYSTEMS:   Review of Systems - Oncology  All other pertinent systems were reviewed with the patient and are negative.  ALLERGIES: He has no known allergies.  MEDICATIONS:  Current Outpatient Medications  Medication Sig Dispense Refill   albuterol  (PROVENTIL ) (2.5 MG/3ML) 0.083% nebulizer solution Take 3 mLs (2.5 mg total) by nebulization every 6 (six) hours as needed for wheezing or shortness of breath. 75 mL 12   albuterol  (VENTOLIN  HFA) 108 (90 Base) MCG/ACT inhaler Inhale 1-2 puffs into the lungs every 4 (four) hours as needed for wheezing or shortness of breath. 18 g 3   amoxicillin -clavulanate (AUGMENTIN ) 875-125 MG tablet Take 1  tablet by mouth 2 (two) times daily. 20 tablet 0   cyanocobalamin  1000 MCG tablet Take 1 tablet (1,000 mcg total) by mouth daily. 30 tablet 6   DULoxetine  (CYMBALTA ) 20 MG capsule Take 1 capsule (20 mg total) by mouth daily. 30 capsule 0   folic acid  (FOLVITE ) 1 MG tablet Take 1 tablet (1 mg total) by mouth  daily. 30 tablet 6   methadone  (DOLOPHINE ) 5 MG tablet Take 1 tablet (5 mg total) by mouth daily. (Patient taking differently: Take 5 mg by mouth every evening.) 30 tablet 0   pantoprazole  (PROTONIX ) 40 MG tablet Take 1 tablet (40 mg total) by mouth 2 (two) times daily. 60 tablet 1   potassium chloride  (KLOR-CON ) 10 MEQ tablet Take 1 tablet (10 mEq total) by mouth daily for 20 doses. 20 tablet 0   thiamine  (VITAMIN B-1) 100 MG tablet Take 1 tablet (100 mg total) by mouth daily. 30 tablet 0   [START ON 02/18/2024] HYDROmorphone  (DILAUDID ) 2 MG tablet Take 1 tablet (2 mg total) by mouth every 4 (four) hours as needed for severe pain (pain score 7-10). 45 tablet 0   temazepam  (RESTORIL ) 15 MG capsule Take 1 capsule (15 mg total) by mouth at bedtime as needed for sleep. 30 capsule 0   No current facility-administered medications for this visit.     VITALS:   Blood pressure (!) 123/90, pulse 68, temperature 98.4 F (36.9 C), temperature source Temporal, resp. rate 16, height 5' 6 (1.676 m), weight 114 lb 11.2 oz (52 kg), SpO2 99%.  Wt Readings from Last 3 Encounters:  02/12/24 114 lb 11.2 oz (52 kg)  02/05/24 111 lb 12.4 oz (50.7 kg)  02/02/24 116 lb (52.6 kg)    Body mass index is 18.51 kg/m.   Onc Performance Status - 02/12/24 1211       ECOG Perf Status   ECOG Perf Status Capable of only limited selfcare, confined to bed or chair more than 50% of waking hours      KPS SCALE   KPS % SCORE Requires occasional assistance but is able to care for most needs               PHYSICAL EXAM:   Physical Exam Constitutional:      General: He is not in acute distress.    Appearance: Normal appearance.  HENT:     Head: Normocephalic and atraumatic.  Eyes:     General: No scleral icterus.    Conjunctiva/sclera: Conjunctivae normal.  Cardiovascular:     Rate and Rhythm: Normal rate and regular rhythm.     Heart sounds: Normal heart sounds.  Pulmonary:     Effort: Pulmonary  effort is normal.     Breath sounds: Normal breath sounds.  Chest:     Comments: Port-A-Cath in place without signs of infection Abdominal:     General: There is no distension.  Lymphadenopathy:     Cervical: No cervical adenopathy.  Neurological:     General: No focal deficit present.     Mental Status: He is oriented to person, place, and time.  Psychiatric:        Mood and Affect: Mood normal.        Behavior: Behavior normal.      LABORATORY DATA:   I have reviewed the data as listed.  Results for orders placed or performed in visit on 02/12/24  CMP (Cancer Center only)  Result Value Ref Range   Sodium 136 135 -  145 mmol/L   Potassium 4.2 3.5 - 5.1 mmol/L   Chloride 105 98 - 111 mmol/L   CO2 28 22 - 32 mmol/L   Glucose, Bld 94 70 - 99 mg/dL   BUN 9 6 - 20 mg/dL   Creatinine 9.48 (L) 9.38 - 1.24 mg/dL   Calcium  8.6 (L) 8.9 - 10.3 mg/dL   Total Protein 5.7 (L) 6.5 - 8.1 g/dL   Albumin 3.3 (L) 3.5 - 5.0 g/dL   AST 13 (L) 15 - 41 U/L   ALT 10 0 - 44 U/L   Alkaline Phosphatase 67 38 - 126 U/L   Total Bilirubin 0.5 0.0 - 1.2 mg/dL   GFR, Estimated >39 >39 mL/min   Anion gap 3 (L) 5 - 15  CBC with Differential (Cancer Center Only)  Result Value Ref Range   WBC Count 2.5 (L) 4.0 - 10.5 K/uL   RBC 4.41 4.22 - 5.81 MIL/uL   Hemoglobin 11.6 (L) 13.0 - 17.0 g/dL   HCT 63.6 (L) 60.9 - 47.9 %   MCV 82.3 80.0 - 100.0 fL   MCH 26.3 26.0 - 34.0 pg   MCHC 32.0 30.0 - 36.0 g/dL   RDW 84.0 (H) 88.4 - 84.4 %   Platelet Count 186 150 - 400 K/uL   nRBC 0.0 0.0 - 0.2 %   Neutrophils Relative % 53 %   Neutro Abs 1.3 (L) 1.7 - 7.7 K/uL   Lymphocytes Relative 22 %   Lymphs Abs 0.6 (L) 0.7 - 4.0 K/uL   Monocytes Relative 16 %   Monocytes Absolute 0.4 0.1 - 1.0 K/uL   Eosinophils Relative 8 %   Eosinophils Absolute 0.2 0.0 - 0.5 K/uL   Basophils Relative 1 %   Basophils Absolute 0.0 0.0 - 0.1 K/uL   Immature Granulocytes 0 %   Abs Immature Granulocytes 0.01 0.00 - 0.07 K/uL      RADIOGRAPHIC STUDIES:  CT Angio Chest PE W and/or Wo Contrast EXAM: CTA CHEST PE WITHOUT AND WITH CONTRAST CT ABDOMEN AND PELVIS WITHOUT AND WITH CONTRAST 02/03/2024 12:54:51 AM  TECHNIQUE: CTA of the chest was performed after the administration of intravenous contrast. Multiplanar reformatted images are provided for review. MIP images are provided for review. CT of the abdomen and pelvis was performed with the administration of intravenous contrast. Automated exposure control, iterative reconstruction, and/or weight based adjustment of the mA/kV was utilized to reduce the radiation dose to as low as reasonably achievable.  COMPARISON: Chest radiograph 02/02/2024 and PET/CT 12/30/2023.  CLINICAL HISTORY: Pulmonary embolism (PE) suspected, low to intermediate prob, positive D-dimer; SOB, abd pain, pos ddimer, hx meta CA.  FINDINGS:  CHEST:  PULMONARY ARTERIES: Pulmonary arteries are adequately opacified for evaluation. No intraluminal filling defect to suggest pulmonary embolism. Main pulmonary artery is normal in caliber.  MEDIASTINUM: Interval increase in size of a precarinal lymph node measuring 1.6 cm, previously 0.9 cm (series 8 image 159). Similar size of the enlarged subcarinal lymph node measuring 1.4 cm. Right hilar lymph node measuring 1.2 cm on series 8 image 187. Enlarged left hilar lymph node measuring 1.2 cm on series 8 image 161. Small pericardial effusion similar to prior. There is mild thickening of the pericardium suggesting inflammation or infection.  LUNGS AND PLEURA: Debris fills the right lower lobe bronchus, suspicious for aspiration. No focal consolidation or pneumothorax. Trace left pleural effusion and atelectasis in the left lower lobe.  SOFT TISSUES AND BONES: No acute fracture or destructive osseous lesion in the  chest. Chest wall port-a-cath on the right with tip in the right atrium.  ABDOMEN AND PELVIS:  LIVER: Stable hepatic  cysts.  GALLBLADDER AND BILE DUCTS: Gallbladder is unremarkable. No biliary ductal dilatation.  SPLEEN: Spleen demonstrates no acute abnormality.  PANCREAS: Pancreas demonstrates no acute abnormality.  ADRENAL GLANDS: Adrenal glands demonstrate no acute abnormality.  KIDNEYS, URETERS AND BLADDER: No stones in the kidneys or ureters. No hydronephrosis. No perinephric or periureteral stranding. Urinary bladder is unremarkable.  GI AND BOWEL: Wall thickening of the distal esophagus near the gastroesophageal junction similar to prior. Thickening of the stomach wall with mucosal hyperenhancement. Diffuse colonic wall thickening and mucosal hyperenhancement suggestive of colitis. No bowel obstruction.  REPRODUCTIVE: Reproductive organs are unremarkable.  PERITONEUM AND RETROPERITONEUM: Small volume abdominal pelvic ascites. No free intraperitoneal air. Similar nodular thickening in the left upper quadrant along the peritoneum which demonstrated FDG avidity on 12/30/2023 (series 4 image 13).  LYMPH NODES: Similar left paraaortic lymphadenopathy, for example a 1.5 x 2.0 cm node on series 6 image 86 is not substantially changed from 12/30/2023.  BONES AND SOFT TISSUES: No acute abnormality of the visualized bones. No focal soft tissue abnormality.  IMPRESSION: 1. No pulmonary embolism. 2. Wall thickening of the esophagus, stomach may be due to infection or inflammation. Infiltrative neoplasm not excluded. 3. Wall thickening of the colon compatible with infectious or inflammatory pancolitis. 4. Small left pleural effusion and atelectasis in the left lower lobe. 5. Increased size of a precarinal lymph node. Re-demonstrated mediastinal, hilar, and retroperitoneal metastatic lymphadenopathy. 6. Small pericardial effusion and mild thickening of the pericardium suggesting inflammation or infection.  Electronically signed by: Norman Gatlin MD 02/03/2024 01:20 AM EDT  RP Workstation: [id number] CT ABDOMEN PELVIS W CONTRAST EXAM: CTA CHEST PE WITHOUT AND WITH CONTRAST CT ABDOMEN AND PELVIS WITHOUT AND WITH CONTRAST 02/03/2024 12:54:51 AM  TECHNIQUE: CTA of the chest was performed after the administration of intravenous contrast. Multiplanar reformatted images are provided for review. MIP images are provided for review. CT of the abdomen and pelvis was performed with the administration of intravenous contrast. Automated exposure control, iterative reconstruction, and/or weight based adjustment of the mA/kV was utilized to reduce the radiation dose to as low as reasonably achievable.  COMPARISON: Chest radiograph 02/02/2024 and PET/CT 12/30/2023.  CLINICAL HISTORY: Pulmonary embolism (PE) suspected, low to intermediate prob, positive D-dimer; SOB, abd pain, pos ddimer, hx meta CA.  FINDINGS:  CHEST:  PULMONARY ARTERIES: Pulmonary arteries are adequately opacified for evaluation. No intraluminal filling defect to suggest pulmonary embolism. Main pulmonary artery is normal in caliber.  MEDIASTINUM: Interval increase in size of a precarinal lymph node measuring 1.6 cm, previously 0.9 cm (series 8 image 159). Similar size of the enlarged subcarinal lymph node measuring 1.4 cm. Right hilar lymph node measuring 1.2 cm on series 8 image 187. Enlarged left hilar lymph node measuring 1.2 cm on series 8 image 161. Small pericardial effusion similar to prior. There is mild thickening of the pericardium suggesting inflammation or infection.  LUNGS AND PLEURA: Debris fills the right lower lobe bronchus, suspicious for aspiration. No focal consolidation or pneumothorax. Trace left pleural effusion and atelectasis in the left lower lobe.  SOFT TISSUES AND BONES: No acute fracture or destructive osseous lesion in the chest. Chest wall port-a-cath on the right with tip in the right atrium.  ABDOMEN AND PELVIS:  LIVER: Stable hepatic  cysts.  GALLBLADDER AND BILE DUCTS: Gallbladder is unremarkable. No biliary ductal dilatation.  SPLEEN: Spleen demonstrates no  acute abnormality.  PANCREAS: Pancreas demonstrates no acute abnormality.  ADRENAL GLANDS: Adrenal glands demonstrate no acute abnormality.  KIDNEYS, URETERS AND BLADDER: No stones in the kidneys or ureters. No hydronephrosis. No perinephric or periureteral stranding. Urinary bladder is unremarkable.  GI AND BOWEL: Wall thickening of the distal esophagus near the gastroesophageal junction similar to prior. Thickening of the stomach wall with mucosal hyperenhancement. Diffuse colonic wall thickening and mucosal hyperenhancement suggestive of colitis. No bowel obstruction.  REPRODUCTIVE: Reproductive organs are unremarkable.  PERITONEUM AND RETROPERITONEUM: Small volume abdominal pelvic ascites. No free intraperitoneal air. Similar nodular thickening in the left upper quadrant along the peritoneum which demonstrated FDG avidity on 12/30/2023 (series 4 image 13).  LYMPH NODES: Similar left paraaortic lymphadenopathy, for example a 1.5 x 2.0 cm node on series 6 image 86 is not substantially changed from 12/30/2023.  BONES AND SOFT TISSUES: No acute abnormality of the visualized bones. No focal soft tissue abnormality.  IMPRESSION: 1. No pulmonary embolism. 2. Wall thickening of the esophagus, stomach may be due to infection or inflammation. Infiltrative neoplasm not excluded. 3. Wall thickening of the colon compatible with infectious or inflammatory pancolitis. 4. Small left pleural effusion and atelectasis in the left lower lobe. 5. Increased size of a precarinal lymph node. Re-demonstrated mediastinal, hilar, and retroperitoneal metastatic lymphadenopathy. 6. Small pericardial effusion and mild thickening of the pericardium suggesting inflammation or infection.  Electronically signed by: Norman Gatlin MD 02/03/2024 01:20 AM EDT  RP Workstation: [id number]    CODE STATUS:  Code Status History     Date Active Date Inactive Code Status Order ID Comments User Context   06/14/2023 1621 07/11/2023 2225 Full Code [id number]  Celinda Alm Lot, MD Inpatient   06/13/2023 1253 06/14/2023 0509 Full Code [id number]  Philip Cornet, MD HOV   05/27/2023 1516 05/30/2023 1651 Full Code [id number]  Zella Katha HERO, MD ED    Questions for Most Recent Historical Code Status (Order [id number])     Question Answer   By: Consent: discussion documented in EHR            Orders Placed This Encounter  Procedures   CBC with Differential (Cancer Center Only)    Standing Status:   Future    Expected Date:   02/26/2024    Expiration Date:   02/25/2025   CMP (Cancer Center only)    Standing Status:   Future    Expected Date:   02/26/2024    Expiration Date:   02/25/2025    Future Appointments  Date Time Provider Department Center  02/26/2024  9:15 AM [facility] FLUSH [facility] None  02/26/2024  9:45 AM Karesha Trzcinski, Chinita, MD [facility] None  02/26/2024 10:00 AM [facility] INFUSION [facility] None  02/26/2024 12:00 PM [facility] PALLIATIVE CARE [facility] None     This document was completed utilizing speech recognition software. Grammatical errors, random word insertions, pronoun errors, and incomplete sentences are an occasional consequence of this system due to software limitations, ambient noise, and hardware issues. Any formal questions or concerns about the content, text or information contained within the body of this dictation should be directly addressed to the provider for clarification.

## 2024-02-13 ENCOUNTER — Encounter: Payer: Self-pay | Admitting: Oncology

## 2024-02-13 ENCOUNTER — Other Ambulatory Visit (HOSPITAL_COMMUNITY): Payer: Self-pay

## 2024-02-13 NOTE — Assessment & Plan Note (Signed)
-  Please review HPI/oncology history for additional details and timeline of events.  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  Discussed treatment options. All treatment options are palliative in nature and not curative intent. Plan made to proceed with palliative systemic treatment using FOLFOX plus nivolumab .   -He started palliative systemic treatments with FOLFOX plus nivolumab  from 07/23/2023.  He was admitted to the hospital on 07/30/2023 after he presented with chest and epigastric pain, episodes of melena. He was found to have hemoglobin of 6.8. He was admitted for blood transfusion and further evaluation.  GI deferred additional workup/EGD since the cause of blood loss seemed obvious from malignancy. He was discharged home on 08/02/2023.   -He resumed systemic treatments with cycle 2 of FOLFOX plus nivolumab  on 08/07/2023 without 5-FU bolus and a 20% dose reduction of oxaliplatin  and tolerated the chemotherapy and immunotherapy reasonably well.  Did not experience major side effects.  Recent lower extremity ultrasound to rule out DVT showed possible inguinal lymphadenopathy.  Hence PET/CT was obtained for further evaluation on 09/10/2023.  It showed significant improvement with decreased size and metabolic some of the gastric mass.  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 Hence plan made to continue current management with dose reduced FOLFOX and nivolumab  for up to 6 cycles followed by maintenance treatments.  He received cycle 6 of FOLFOX plus nivolumab  on 11/06/2023. Following cycle 6, plan was to transition him to maintenance treatments with 5-FU and nivolumab , to be continued every 2 weeks.  However because of fatigue, progressive weight loss, we could only use nivolumab  treatments.  Because of progressive weight loss, we obtained restaging scan sooner.  On 12/30/2023, restaging PET scan showed  evidence of mild disease progression with uptake in size and number of pathologic lymph nodes in the bilateral lung hilum, mediastinum and retroperitoneum.  Subtle peritoneal focus of carcinomatosis was noted.  Groundglass hazy opacity in the lungs in the right lower lobe and left upper lobe with some low-level uptake.  Since patient could not tolerate FOLFOX plus nivolumab  because of side effects, plan made to change regimen to ramucirumab  plus paclitaxel  on days 1, 8, 15 of 28-day cycle. In the future, we could reconsider FOLFOX as needed.  Discussed side effect profile including risk of thrombosis, wound healing issues, bleeding issues, hypertension, neuropathy, nausea, vomiting, fatigue, altered taste, infusion reactions etc.  Patient verbalized understanding and was willing to proceed with this regimen.  Treatment was switched to ramucirumab  plus paclitaxel  starting from 01/15/2024.  Patient has been having difficult time with the current regimen, especially with neuropathy, hair loss, fatigue, decreased appetite and weight loss.  He was recently hospitalized for supportive care.  He was also found to have some aspiration issues.  He would like to take a break for 2 more weeks before reconsidering whether he can continue with current regimen or not.  We will dose reduce chemotherapy and also change intervals to days 1, 15 of 28-day cycle.  - Monitor weight and nutritional intake, encourage protein and calorie intake with supplements like Boost or Ensure.

## 2024-02-13 NOTE — Progress Notes (Signed)
 Palliative Medicine [facility]  [phone] [phone]   Name: [name] Date: 02/13/2024 MRN: [id number]  DOB: 12-27-1975  Patient Care Team: [name], NP as PCP - General (Pulmonary Disease) Autumn Millman, MD as Consulting Physician (Oncology) Rosalie Kitchens, MD as Consulting Physician (Gastroenterology) Pickenpack-Cousar, Fannie SAILOR, NP as Nurse Practitioner (Hospice and Palliative Medicine)    INTERVAL HISTORY: Jesus Haynes is a 48 y.o. 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 Palliative ask to see for symptom management and goals of care.   SOCIAL HISTORY:     reports that he quit smoking about 11 years ago. His smoking use included cigarettes. He has never used smokeless tobacco. He reports that he does not drink alcohol and does not use drugs.  ADVANCE DIRECTIVES:  None on file   CODE STATUS: Full code  PAST MEDICAL HISTORY: Past Medical History:  Diagnosis Date   Asthma     ALLERGIES:  has no known allergies.  MEDICATIONS:  Current Outpatient Medications  Medication Sig Dispense Refill   [START ON 02/18/2024] HYDROmorphone  (DILAUDID ) 2 MG tablet Take 1 tablet (2 mg total) by mouth every 4 (four) hours as needed for severe pain (pain score 7-10). 45 tablet 0   temazepam  (RESTORIL ) 15 MG capsule Take 1 capsule (15 mg total) by mouth at bedtime as needed for sleep. 30 capsule 0   albuterol  (PROVENTIL ) (2.5 MG/3ML) 0.083% nebulizer solution Take 3 mLs (2.5 mg total) by nebulization every 6 (six) hours as needed for wheezing or shortness of breath. 75 mL 12   albuterol  (VENTOLIN  HFA) 108 (90 Base) MCG/ACT inhaler Inhale 1-2 puffs into the lungs every 4 (four) hours as needed for wheezing or shortness of breath. 18 g 3   amoxicillin -clavulanate (AUGMENTIN ) 875-125 MG tablet Take 1 tablet by  mouth 2 (two) times daily. 20 tablet 0   cyanocobalamin  1000 MCG tablet Take 1 tablet (1,000 mcg total) by mouth daily. 30 tablet 6   DULoxetine  (CYMBALTA ) 20 MG capsule Take 1 capsule (20 mg total) by mouth daily. 30 capsule 0   folic acid  (FOLVITE ) 1 MG tablet Take 1 tablet (1 mg total) by mouth daily. 30 tablet 6   methadone  (DOLOPHINE ) 5 MG tablet Take 1 tablet (5 mg total) by mouth daily. (Patient taking differently: Take 5 mg by mouth every evening.) 30 tablet 0   pantoprazole  (PROTONIX ) 40 MG tablet Take 1 tablet (40 mg total) by mouth 2 (two) times daily. 60 tablet 1   potassium chloride  (KLOR-CON ) 10 MEQ tablet Take 1 tablet (10 mEq total) by mouth daily for 20 doses. 20 tablet 0   thiamine  (VITAMIN B-1) 100 MG tablet Take 1 tablet (100 mg total) by mouth daily. 30 tablet 0   No current facility-administered medications for this visit.    VITAL SIGNS: There were no vitals taken for this visit. There were no vitals filed for this visit.  Estimated body mass index is 18.51 kg/m as calculated from the following:   Height as of an earlier encounter on 02/12/24: 5' 6 (1.676 m).   Weight as of an earlier encounter on 02/12/24: 114 lb 11.2 oz (52 kg).  PERFORMANCE STATUS (ECOG) : 1 - Symptomatic but completely ambulatory  Physical Exam General: NAD, thin, weak appearing  Cardiovascular: regular rate and rhythm Pulmonary: normal breathing pattern Extremities: no edema, no joint deformities Skin: no  rashes Neurological: AAO x3  IMPRESSION: Discussed the use of AI scribe software for clinical note transcription with the patient, who gave verbal consent to proceed. History of Present Illness Jesus Haynes is a 48 year old male who presents for symptom management follow-up. He is accompanied by family. Denies concerns for constipation or diarrhea. Occasional nausea. Appetite remains challenged. Some days are better than others. Current weight is 114lbs up from 111lbs. patient was  recently hospitalized for worsening paresthesias and intractable pain related to chemotherapy.  He was seen by our palliative team during that time with medication adjustments.  Treatment held today per Dr. Autumn due to failure to thrive.  He experiences bowel movements every other day, with no constipation or diarrhea reported. His sleep medication (mirtazapine ) was recently discontinued during hospitalization due to concerns about heart rate, QTc, and respiratory depression. He is now prescribed Restoril , to be taken at bedtime, ensuring it is not taken with methadone  or Dilaudid .  Patient and family verbalized understanding.  We discussed his pain at length.  Saurabh and family reports his pain is well-controlled on current regimen.  He is requiring pain medication twice daily. He takes methadone  at 9 PM and Dilaudid  2 mg as needed.  Patient knows not to take methadone  and Dilaudid  at the same time.  He also takes Cymbalta  20 mg daily.  No adjustments to current regimen at this time.  We will continue to closely monitor and support.  All questions answered and support provided.  Goals of care We discussed his current illness and what it means in the larger context of his ongoing comorbidities.  Natural disease trajectory and expectations were discussed.  Family are realistic in their understanding of incurable disease.  They are clear and expressed wishes to continue to treat the treatable allowing Mr. Thong every opportunity to continue to thrive as they feel his quality of life was much improved despite understanding of disease progression.  They are focused on his symptom management at this time feeling of his nausea and pain can get better controlled patient will continue to do well for as long as he can.  Patient and family able to speak to palliative focus treatments including current chemo regimen.  Discussions per patient and family regarding his end-of-life care preferences, including a  do-not-resuscitate (DNR) order and his desire to remain at home with family if possible.  Armas and his family expressed wishes regarding a natural death and no life-sustaining measures.  He would like to complete DNR/MOST documentation.  Education provided on DNR/DNI and what this would like for patient in the home and or at a medical facility.  Extensive education also provided on MOST form. I completed a MOST form today. The patient and family outlined their wishes for the following treatment decisions:  Cardiopulmonary Resuscitation: Do Not Attempt Resuscitation (DNR/No CPR)  Medical Interventions: Comfort Measures: Keep clean, warm, and dry. Use medication by any route, positioning, wound care, and other measures to relieve pain and suffering. Use oxygen , suction and manual treatment of airway obstruction as needed for comfort. Do not transfer to the hospital unless comfort needs cannot be met in current location.  Antibiotics: Determine use of limitation of antibiotics when infection occurs  IV Fluids: IV fluids for a defined trial period  Feeding Tube: No feeding tube    I discussed the importance of continued conversation with family and their medical providers regarding overall plan of care and treatment options, ensuring decisions are within the context of  the patients values and GOCs. 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 Emphasis on quality of life and comfort measures. Preference for natural death without resuscitation in the event of disease progression, with comfort measures at home. Hospital care is an option if home needs cannot be met. - Discuss and completed DNR paperwork. Original provided to patient and family  - Discuss and completed medical orders for scope of treatment (MOST). Original provided to patient and family  - Establish palliative care services with [facility].   Chronic cancer-related pain Chronic  cancer-related pain managed with methadone  and Dilaudid . Importance of not taking Dilaudid  and methadone  together to avoid potential overdose. - Continue methadone  at 9 PM - Continue Dilaudid , ensuring it is not taken with methadone  - Proactively prepare Dilaudid  prescription for refill by August 20  Constipation No current issues with constipation. Bowel movements occurring at least every other day.  Insomnia Insomnia previously managed with trazodone  and mirtazapine , discontinued due to adverse effects on heart rate and respiration. New prescription for Restoril  to be taken at bedtime, ensuring it is not taken with methadone  or Dilaudid  to avoid potential overdose. - Prescribe Restoril , one capsule at bedtime - Ensure Restoril  is taken at least two-three hours apart from methadone  and Dilaudid  - Follow up in two weeks to assess effectiveness of Restoril   I will plan to see patient back in 2-3 weeks.  Sooner if needed.  Patient expressed understanding and was in agreement with this plan. He also understands that He can call the clinic at any time with any questions, concerns, or complaints.   Any controlled substances utilized were prescribed in the context of palliative care. PDMP has been reviewed.   Visit consisted of counseling and education dealing with the complex and emotionally intense issues of symptom management and palliative care in the setting of serious and potentially life-threatening illness.  Levon Borer, AGPCNP-BC  Palliative Medicine Team/[facility]

## 2024-02-14 ENCOUNTER — Other Ambulatory Visit (HOSPITAL_COMMUNITY): Payer: Self-pay

## 2024-02-15 ENCOUNTER — Other Ambulatory Visit (HOSPITAL_COMMUNITY): Payer: Self-pay

## 2024-02-18 ENCOUNTER — Other Ambulatory Visit: Payer: Self-pay | Admitting: Nurse Practitioner

## 2024-02-18 ENCOUNTER — Telehealth: Payer: Self-pay

## 2024-02-18 ENCOUNTER — Other Ambulatory Visit (HOSPITAL_COMMUNITY): Payer: Self-pay

## 2024-02-18 ENCOUNTER — Other Ambulatory Visit: Payer: Self-pay | Admitting: Oncology

## 2024-02-18 ENCOUNTER — Other Ambulatory Visit: Payer: Self-pay

## 2024-02-18 DIAGNOSIS — C160 Malignant neoplasm of cardia: Secondary | ICD-10-CM

## 2024-02-18 DIAGNOSIS — G893 Neoplasm related pain (acute) (chronic): Secondary | ICD-10-CM

## 2024-02-18 DIAGNOSIS — Z515 Encounter for palliative care: Secondary | ICD-10-CM

## 2024-02-18 MED ORDER — METHADONE HCL 5 MG PO TABS
5.0000 mg | ORAL_TABLET | Freq: Every day | ORAL | 0 refills | Status: DC
  Filled 2024-02-18: qty 30, 30d supply, fill #0

## 2024-02-18 NOTE — Transitions of Care (Post Inpatient/ED Visit) (Signed)
   02/18/2024  Name: DIOR STEPTER MRN: [id number] DOB: 1975-11-13  Today's TOC FU Call Status: Today's TOC FU Call Status:: Unsuccessful Call (3rd Attempt) Unsuccessful Call (3rd Attempt) Date: 02/18/24  Attempted to reach the patient regarding the most recent Inpatient/ED visit.  Follow Up Plan: No further outreach attempts will be made at this time. We have been unable to contact the patient.  Signature  [employer], [place]

## 2024-02-19 ENCOUNTER — Other Ambulatory Visit (HOSPITAL_COMMUNITY): Payer: Self-pay

## 2024-02-19 ENCOUNTER — Ambulatory Visit: Admitting: Oncology

## 2024-02-19 ENCOUNTER — Encounter: Admitting: Dietician

## 2024-02-19 ENCOUNTER — Other Ambulatory Visit

## 2024-02-19 ENCOUNTER — Ambulatory Visit

## 2024-02-20 ENCOUNTER — Other Ambulatory Visit (HOSPITAL_COMMUNITY): Payer: Self-pay

## 2024-02-20 ENCOUNTER — Emergency Department (HOSPITAL_COMMUNITY)
Admission: EM | Admit: 2024-02-20 | Discharge: 2024-02-20 | Disposition: A | Discharge status: Home / Self Care | Attending: Emergency Medicine | Admitting: Emergency Medicine

## 2024-02-20 ENCOUNTER — Other Ambulatory Visit: Payer: Self-pay

## 2024-02-20 ENCOUNTER — Telehealth: Payer: Self-pay

## 2024-02-20 ENCOUNTER — Encounter (HOSPITAL_COMMUNITY): Payer: Self-pay | Admitting: Emergency Medicine

## 2024-02-20 ENCOUNTER — Emergency Department (HOSPITAL_COMMUNITY)

## 2024-02-20 DIAGNOSIS — R1084 Generalized abdominal pain: Secondary | ICD-10-CM | POA: Diagnosis not present

## 2024-02-20 DIAGNOSIS — R112 Nausea with vomiting, unspecified: Secondary | ICD-10-CM | POA: Diagnosis present

## 2024-02-20 DIAGNOSIS — E860 Dehydration: Secondary | ICD-10-CM | POA: Insufficient documentation

## 2024-02-20 DIAGNOSIS — J45909 Unspecified asthma, uncomplicated: Secondary | ICD-10-CM | POA: Diagnosis not present

## 2024-02-20 LAB — CBC
HCT: 38.8 % — ABNORMAL LOW (ref 39.0–52.0)
Hemoglobin: 11.7 g/dL — ABNORMAL LOW (ref 13.0–17.0)
MCH: 25.7 pg — ABNORMAL LOW (ref 26.0–34.0)
MCHC: 30.2 g/dL (ref 30.0–36.0)
MCV: 85.3 fL (ref 80.0–100.0)
Platelets: 165 K/uL (ref 150–400)
RBC: 4.55 MIL/uL (ref 4.22–5.81)
RDW: 15.9 % — ABNORMAL HIGH (ref 11.5–15.5)
WBC: 4.9 K/uL (ref 4.0–10.5)
nRBC: 0 % (ref 0.0–0.2)

## 2024-02-20 LAB — COMPREHENSIVE METABOLIC PANEL WITH GFR
ALT: 9 U/L (ref 0–44)
AST: 16 U/L (ref 15–41)
Albumin: 2.4 g/dL — ABNORMAL LOW (ref 3.5–5.0)
Alkaline Phosphatase: 52 U/L (ref 38–126)
Anion gap: 12 (ref 5–15)
BUN: 11 mg/dL (ref 6–20)
CO2: 19 mmol/L — ABNORMAL LOW (ref 22–32)
Calcium: 7.5 mg/dL — ABNORMAL LOW (ref 8.9–10.3)
Chloride: 103 mmol/L (ref 98–111)
Creatinine, Ser: 0.63 mg/dL (ref 0.61–1.24)
GFR, Estimated: >60 mL/min (ref >60)
Glucose, Bld: 291 mg/dL — ABNORMAL HIGH (ref 70–99)
Potassium: 3.8 mmol/L (ref 3.5–5.1)
Sodium: 134 mmol/L — ABNORMAL LOW (ref 135–145)
Total Bilirubin: 1.3 mg/dL — ABNORMAL HIGH (ref 0.0–1.2)
Total Protein: 5 g/dL — ABNORMAL LOW (ref 6.5–8.1)

## 2024-02-20 LAB — LIPASE, BLOOD: Lipase: 18 U/L (ref 11–51)

## 2024-02-20 MED ORDER — HYDROMORPHONE HCL 1 MG/ML IJ SOLN
1.0000 mg | Freq: Once | INTRAMUSCULAR | Status: AC
  Administered 2024-02-20: 1 mg via INTRAVENOUS
  Filled 2024-02-20: qty 1

## 2024-02-20 MED ORDER — IOHEXOL 300 MG/ML  SOLN
100.0000 mL | Freq: Once | INTRAMUSCULAR | Status: AC | PRN
  Administered 2024-02-20: 100 mL via INTRAVENOUS

## 2024-02-20 MED ORDER — METOCLOPRAMIDE HCL 5 MG/ML IJ SOLN
10.0000 mg | Freq: Once | INTRAMUSCULAR | Status: AC
  Administered 2024-02-20: 10 mg via INTRAVENOUS
  Filled 2024-02-20: qty 2

## 2024-02-20 MED ORDER — LACTATED RINGERS IV BOLUS
1000.0000 mL | Freq: Once | INTRAVENOUS | Status: AC
  Administered 2024-02-20: 1000 mL via INTRAVENOUS

## 2024-02-20 MED ORDER — LACTATED RINGERS IV BOLUS
1000.0000 mL | Freq: Once | INTRAVENOUS | Status: AC
Start: 2024-02-20 — End: 2024-02-20
  Administered 2024-02-20: 1000 mL via INTRAVENOUS

## 2024-02-20 MED ORDER — CALCIUM GLUCONATE-NACL 1-0.675 GM/50ML-% IV SOLN
1.0000 g | Freq: Once | INTRAVENOUS | Status: AC
  Administered 2024-02-20: 1000 mg via INTRAVENOUS
  Filled 2024-02-20: qty 50

## 2024-02-20 NOTE — ED Provider Notes (Addendum)
 [place] EMERGENCY DEPARTMENT AT [facility] Provider Note   CSN: [id number] Arrival date & time: 02/20/24  1236     History  Chief Complaint  Patient presents with   Abdominal Pain    Jesus Haynes is a 48 y.o. male with PMH as listed below who presents with c/o nausea and vomiting with generalized abdominal pain x 2 days. Per EMS initial BP was 90/60 , NS given by EMS.  Patient report nausea and vomiting x 3 just today, denies diarrhea, hematochezia/melena. Hx Gastric adenocarcinoma Last Chemo treatment 2 weeks ago. He reports numbness tingling in his hands as well as feeling as though he could not bend his fingers periodically.  He states that when the blood pressure cuff goes off he feels like his hand spasms.  Has had trouble with his potassium in the past and often requires potassium repletion.  He denies any chest pain, shortness of breath, cough, fever/chills, urinary symptoms.   Past Medical History:  Diagnosis Date   Asthma        Home Medications Prior to Admission medications   Medication Sig Start Date End Date Taking? Authorizing Provider  albuterol  (PROVENTIL ) (2.5 MG/3ML) 0.083% nebulizer solution Take 3 mLs (2.5 mg total) by nebulization every 6 (six) hours as needed for wheezing or shortness of breath. 08/07/23   Pickenpack-Cousar, Athena N, NP  albuterol  (VENTOLIN  HFA) 108 (90 Base) MCG/ACT inhaler Inhale 1-2 puffs into the lungs every 4 (four) hours as needed for wheezing or shortness of breath. 01/31/24   Pasam, Chinita, MD  amoxicillin -clavulanate (AUGMENTIN ) 875-125 MG tablet Take 1 tablet by mouth 2 (two) times daily. 02/12/24   Pasam, Chinita, MD  cyanocobalamin  1000 MCG tablet Take 1 tablet (1,000 mcg total) by mouth daily. 10/14/23   Pickenpack-Cousar, Fannie SAILOR, NP  DULoxetine  (CYMBALTA ) 20 MG capsule Take 1 capsule (20 mg total) by mouth daily. 02/07/24   Lue Elsie BROCKS, MD  folic acid  (FOLVITE ) 1 MG tablet Take 1 tablet (1 mg total) by  mouth daily. 10/14/23   Pickenpack-Cousar, Athena N, NP  HYDROmorphone  (DILAUDID ) 2 MG tablet Take 1 tablet (2 mg total) by mouth every 4 (four) hours as needed for severe pain (pain score 7-10). 02/18/24   Pickenpack-Cousar, Fannie SAILOR, NP  methadone  (DOLOPHINE ) 5 MG tablet Take 1 tablet (5 mg total) by mouth daily. 02/18/24   Pickenpack-Cousar, Athena N, NP  pantoprazole  (PROTONIX ) 40 MG tablet Take 1 tablet (40 mg total) by mouth 2 (two) times daily. 01/02/24 03/13/24  [name], NP  potassium chloride  (KLOR-CON ) 10 MEQ tablet Take 1 tablet (10 mEq total) by mouth daily for 20 doses. 02/04/24 03/03/24  Cottie Donnice PARAS, MD  temazepam  (RESTORIL ) 15 MG capsule Take 1 capsule (15 mg total) by mouth at bedtime as needed for sleep. 02/12/24   Pickenpack-Cousar, Fannie SAILOR, NP  thiamine  (VITAMIN B-1) 100 MG tablet Take 1 tablet (100 mg total) by mouth daily. 02/07/24   Lue Elsie BROCKS, MD      Allergies    Patient has no known allergies.    Review of Systems   Review of Systems A 10 point review of systems was performed and is negative unless otherwise reported in HPI.  Physical Exam Updated Vital Signs BP 128/88 (BP Location: Right Arm)   Pulse 64   Temp 98.2 F (36.8 C) (Oral)   Resp 18   SpO2 95%  Physical Exam General: Chronically ill-appearing thin male lying in bed HEENT: PERRLA, Sclera  anicteric, dry mucous membranes, trachea midline.  Cardiology: RRR, no murmurs/rubs/gallops.  Resp: Normal respiratory rate and effort. CTAB, no wheezes, rhonchi, crackles.  Abd: Soft, diffuse abdominal tenderness palpation non-distended. No rebound tenderness or guarding.  GU: Deferred. MSK: No peripheral edema or signs of trauma. Extremities without deformity or TTP. No cyanosis or clubbing. Skin: warm, dry.  Back: No CVA tenderness Neuro: A&Ox4, CNs II-XII grossly intact. MAEs. Sensation grossly intact.  Psych: Normal mood and affect.   ED Results / Procedures / Treatments   Labs (all labs  ordered are listed, but only abnormal results are displayed) Labs Reviewed  COMPREHENSIVE METABOLIC PANEL WITH GFR - Abnormal; Notable for the following components:      Result Value   Sodium 134 (*)    CO2 19 (*)    Glucose, Bld 291 (*)    Calcium  7.5 (*)    Total Protein 5.0 (*)    Albumin 2.4 (*)    Total Bilirubin 1.3 (*)    All other components within normal limits  CBC - Abnormal; Notable for the following components:   Hemoglobin 11.7 (*)    HCT 38.8 (*)    MCH 25.7 (*)    RDW 15.9 (*)    All other components within normal limits  LIPASE, BLOOD  URINALYSIS, ROUTINE W REFLEX MICROSCOPIC    EKG None  Radiology No results found.  Procedures .Critical Care  Performed by: Franklyn Sid SAILOR, MD Authorized by: Franklyn Sid SAILOR, MD   Critical care provider statement:    Critical care time (minutes):  30   Critical care was necessary to treat or prevent imminent or life-threatening deterioration of the following conditions:  Dehydration   Critical care was time spent personally by me on the following activities:  Development of treatment plan with patient or surrogate, evaluation of patient's response to treatment, examination of patient, ordering and review of laboratory studies, ordering and performing treatments and interventions, pulse oximetry, re-evaluation of patient's condition and review of old charts     Medications Ordered in ED Medications  calcium  gluconate 1 g/ 50 mL sodium chloride  IVPB (has no administration in time range)  lactated ringers  bolus 1,000 mL (1,000 mLs Intravenous New Bag/Given 02/20/24 1437)  lactated ringers  bolus 1,000 mL (1,000 mLs Intravenous New Bag/Given 02/20/24 1501)  metoCLOPramide  (REGLAN ) injection 10 mg (10 mg Intravenous Given 02/20/24 1625)  HYDROmorphone  (DILAUDID ) injection 1 mg (1 mg Intravenous Given 02/20/24 1624)    ED Course/ Medical Decision Making/ A&P                          Medical Decision Making Amount and/or  Complexity of Data Reviewed Labs: ordered. Radiology: ordered.  Risk Prescription drug management.    This patient presents to the ED for concern of nausea vomiting abdominal pain, this involves an extensive number of treatment options, and is a complaint that carries with it a high risk of complications and morbidity.  I considered the following differential and admission for this acute, potentially life threatening condition.  Patient is dry appearing but otherwise hemodynamically stable.  MDM:    For DDX for abdominal pain includes but is not limited to:  Abdominal exam without peritoneal signs. No evidence of acute abdomen at this time.   Patient with diffuse abdominal pain and inability to tolerate p.o. for several days.  Greatest concern for small bowel obstruction or chemotherapy related nausea and vomiting.  Patient has not had any  diarrhea, hematochezia or melena.  He does not have any signs of peritonitis on exam, lower concern for vascular catastrophe or viscus perforation.  He has mildly low calcium  which can be repleted here but otherwise no significant electrolyte derangements or renal injury.  His mucous membranes are extremely dry and he is likely very dehydrated.  Will fluid resuscitate with 2 L of fluid.  Will give patient pain medicine, nausea medicine, and obtain CT abdomen pelvis for further evaluation.   Clinical Course as of 02/20/24 1722  Thu Feb 20, 2024  1603 Patient reevaluated. Has worsening abdominal pain. States he hasn't been able to keep any of his pain medicines down at home. Still feels nauseated. Got zofran  from EMS which didn't help - will give relgan here and get EKG to assess QTc. Will give dialudid as well. Fluids running. Will get CT abd pelvis. [HN]  1657 Received sign out from Dr. Franklyn pending CT abd, urinalysis. Having n/v inability to tolerate PO.  [WS]    Clinical Course User Index [HN] Franklyn Sid SAILOR, MD [WS] Francesca Elsie CROME, MD     Labs: I Ordered, and personally interpreted labs.  The pertinent results include:  those listed above  Imaging Studies ordered: I ordered imaging studies including CT abd pelvis  Additional history obtained from chart review, friend at bedside.   Cardiac Monitoring: The patient was maintained on a cardiac monitor.  I personally viewed and interpreted the cardiac monitored which showed an underlying rhythm of: NSR  Reevaluation: After the interventions noted above, I reevaluated the patient and found that they have :improved  Social Determinants of Health: Lives independnetly   Disposition:  Patient is signed out to the oncoming ED physician Dr. Francesca who is made aware of her history, presentation, exam, workup, and plan.    Co morbidities that complicate the patient evaluation  Past Medical History:  Diagnosis Date   Asthma      Medicines Meds ordered this encounter  Medications   lactated ringers  bolus 1,000 mL   lactated ringers  bolus 1,000 mL   metoCLOPramide  (REGLAN ) injection 10 mg   HYDROmorphone  (DILAUDID ) injection 1 mg   calcium  gluconate 1 g/ 50 mL sodium chloride  IVPB    I have reviewed the patients home medicines and have made adjustments as needed  Problem List / ED Course: Problem List Items Addressed This Visit   None Visit Diagnoses       Dehydration    -  Primary     Generalized abdominal pain         Nausea and vomiting, unspecified vomiting type             Franklyn Sid SAILOR, MD 02/20/24 1723

## 2024-02-20 NOTE — ED Provider Notes (Signed)
   ED Course / MDM   Clinical Course as of 02/20/24 1956  Thu Feb 20, 2024  1603 Patient reevaluated. Has worsening abdominal pain. States he hasn't been able to keep any of his pain medicines down at home. Still feels nauseated. Got zofran  from EMS which didn't help - will give relgan here and get EKG to assess QTc. Will give dialudid as well. Fluids running. Will get CT abd pelvis. [HN]  1657 Received sign out from Dr. Franklyn pending CT abd, urinalysis. Having n/v inability to tolerate PO.  [WS]  1955 Reassessed patient.  He feels much better after receiving nausea medication, fluids.  CT scan shows some mesenteric edema likely from his malnutrition as well as some anasarca and slight ascites.  Do not think the patient definitely needs admission, offered admission for further care and observation the patient would prefer to go home.  Think this is reasonable given his incurable gastric cancer and patient would prefer to be more comfortable at home.  He can follow-up with his oncologist and palliative team as outpatient.  He understands he can return to the emergency department at any time should he desire or need to see us  again. Will discharge patient to home. All questions answered. Patient comfortable with plan of discharge. Return precautions discussed with patient and specified on the after visit summary.  [WS]    Clinical Course User Index [HN] Franklyn Sid SAILOR, MD [WS] Francesca Elsie CROME, MD   Medical Decision Making Amount and/or Complexity of Data Reviewed Labs: ordered. Radiology: ordered.  Risk Prescription drug management.        Francesca Elsie CROME, MD 02/20/24 [id number]

## 2024-02-20 NOTE — Discharge Instructions (Signed)
 We evaluated you for your nausea and vomiting, abdominal pain and dehydration.  Your testing in the emergency department was overall reassuring, we feel that it is safe to go home at this time.  We did not see any signs of severe dehydration or other dangerous problems.  Please follow-up closely with your oncologist and your palliative care team.  If you do have any new or worsening symptoms such as recurrent nausea or vomiting, uncontrolled nausea or vomiting, uncontrolled pain, severe pain, fevers, or any other concerning symptoms, please return to the emergency department so we can reassess you.

## 2024-02-20 NOTE — ED Triage Notes (Signed)
 Patient c/o Generalized abdominal pain x 2 days. Per EMS initial BP was 90/60 , NS given by EMS.  Patient report nausea and vomiting x 3 today ,denies diarrhea. Hx Gastric adenocarcinoma Last Chemo treatment 2 weeks ago.

## 2024-02-20 NOTE — Telephone Encounter (Signed)
 Spoke with patient's family who reported he was being loaded into an ambulance due to uncontrolled vomiting for last day and a half. Pt did have Chemo treatment in the last week. Symptom Management did have a plan to offer patient but he, instead, felt he need to go to the hospital. Dr. Autumn aware.

## 2024-02-20 NOTE — ED Notes (Addendum)
 Pt notified of needing a UA, pt stated not yet, RN will ask pt again.

## 2024-02-20 NOTE — ED Notes (Signed)
 Patient transported to CT

## 2024-02-21 ENCOUNTER — Other Ambulatory Visit: Payer: Self-pay

## 2024-02-21 ENCOUNTER — Inpatient Hospital Stay (HOSPITAL_COMMUNITY)
Admission: EM | Admit: 2024-02-21 | Discharge: 2024-02-24 | DRG: 375 | Disposition: A | Discharge status: Home / Self Care | Attending: Internal Medicine | Admitting: Internal Medicine

## 2024-02-21 ENCOUNTER — Other Ambulatory Visit (HOSPITAL_COMMUNITY): Payer: Self-pay

## 2024-02-21 ENCOUNTER — Emergency Department (HOSPITAL_COMMUNITY)

## 2024-02-21 DIAGNOSIS — R109 Unspecified abdominal pain: Principal | ICD-10-CM | POA: Diagnosis present

## 2024-02-21 DIAGNOSIS — G893 Neoplasm related pain (acute) (chronic): Secondary | ICD-10-CM | POA: Diagnosis present

## 2024-02-21 DIAGNOSIS — Z8249 Family history of ischemic heart disease and other diseases of the circulatory system: Secondary | ICD-10-CM

## 2024-02-21 DIAGNOSIS — Z79891 Long term (current) use of opiate analgesic: Secondary | ICD-10-CM

## 2024-02-21 DIAGNOSIS — E8809 Other disorders of plasma-protein metabolism, not elsewhere classified: Secondary | ICD-10-CM | POA: Diagnosis present

## 2024-02-21 DIAGNOSIS — R9431 Abnormal electrocardiogram [ECG] [EKG]: Secondary | ICD-10-CM | POA: Diagnosis present

## 2024-02-21 DIAGNOSIS — E871 Hypo-osmolality and hyponatremia: Secondary | ICD-10-CM | POA: Diagnosis present

## 2024-02-21 DIAGNOSIS — C160 Malignant neoplasm of cardia: Principal | ICD-10-CM | POA: Diagnosis present

## 2024-02-21 DIAGNOSIS — R609 Edema, unspecified: Secondary | ICD-10-CM | POA: Diagnosis present

## 2024-02-21 DIAGNOSIS — R52 Pain, unspecified: Secondary | ICD-10-CM | POA: Diagnosis present

## 2024-02-21 DIAGNOSIS — C799 Secondary malignant neoplasm of unspecified site: Principal | ICD-10-CM | POA: Diagnosis present

## 2024-02-21 DIAGNOSIS — E162 Hypoglycemia, unspecified: Secondary | ICD-10-CM | POA: Diagnosis present

## 2024-02-21 DIAGNOSIS — Z79899 Other long term (current) drug therapy: Secondary | ICD-10-CM

## 2024-02-21 DIAGNOSIS — D638 Anemia in other chronic diseases classified elsewhere: Secondary | ICD-10-CM | POA: Diagnosis present

## 2024-02-21 DIAGNOSIS — Z66 Do not resuscitate: Secondary | ICD-10-CM | POA: Diagnosis present

## 2024-02-21 DIAGNOSIS — R112 Nausea with vomiting, unspecified: Secondary | ICD-10-CM | POA: Diagnosis present

## 2024-02-21 DIAGNOSIS — Z87891 Personal history of nicotine dependence: Secondary | ICD-10-CM

## 2024-02-21 DIAGNOSIS — J4520 Mild intermittent asthma, uncomplicated: Secondary | ICD-10-CM | POA: Diagnosis present

## 2024-02-21 DIAGNOSIS — E876 Hypokalemia: Secondary | ICD-10-CM | POA: Diagnosis present

## 2024-02-21 DIAGNOSIS — K5900 Constipation, unspecified: Secondary | ICD-10-CM | POA: Diagnosis present

## 2024-02-21 HISTORY — DX: Malignant neoplasm of stomach, unspecified: C16.9

## 2024-02-21 LAB — I-STAT CHEM 8, ED
BUN: 10 mg/dL (ref 6–20)
Calcium, Ion: 1.19 mmol/L (ref 1.15–1.40)
Chloride: 97 mmol/L — ABNORMAL LOW (ref 98–111)
Creatinine, Ser: 0.5 mg/dL — ABNORMAL LOW (ref 0.61–1.24)
Glucose, Bld: 59 mg/dL — ABNORMAL LOW (ref 70–99)
HCT: 34 % — ABNORMAL LOW (ref 39.0–52.0)
Hemoglobin: 11.6 g/dL — ABNORMAL LOW (ref 13.0–17.0)
Potassium: 3.9 mmol/L (ref 3.5–5.1)
Sodium: 134 mmol/L — ABNORMAL LOW (ref 135–145)
TCO2: 20 mmol/L — ABNORMAL LOW (ref 22–32)

## 2024-02-21 MED ORDER — MORPHINE SULFATE (PF) 4 MG/ML IV SOLN
4.0000 mg | Freq: Once | INTRAVENOUS | Status: AC
  Administered 2024-02-21: 4 mg via INTRAVENOUS
  Filled 2024-02-21: qty 1

## 2024-02-21 MED ORDER — PANTOPRAZOLE SODIUM 40 MG IV SOLR
40.0000 mg | Freq: Once | INTRAVENOUS | Status: AC
  Administered 2024-02-22: 40 mg via INTRAVENOUS
  Filled 2024-02-21: qty 10

## 2024-02-21 MED ORDER — SODIUM CHLORIDE 0.9 % IV BOLUS
1000.0000 mL | Freq: Once | INTRAVENOUS | Status: AC
  Administered 2024-02-21: 1000 mL via INTRAVENOUS

## 2024-02-21 MED ORDER — ONDANSETRON HCL 4 MG/2ML IJ SOLN
4.0000 mg | Freq: Once | INTRAMUSCULAR | Status: AC
  Administered 2024-02-21: 4 mg via INTRAVENOUS
  Filled 2024-02-21: qty 2

## 2024-02-21 NOTE — ED Provider Notes (Signed)
 [place] EMERGENCY DEPARTMENT AT [facility] Provider Note   CSN: [id number] Arrival date & time: 02/21/24  2048     Patient presents with: Dehydration and Chest Pain   Jesus Haynes is a 48 y.o. male.   48 year old male with past medical history significant for gastric adenocarcinoma who presents with concern of persistent abdominal pain and inability to keep any food or drink down.  Also endorses some chest pain that started today.  He was seen yesterday with concern of nausea, vomiting, abdominal pain but symptoms have not improved since he has gone home.  He was offered admission at that time however he wanted to try going home and see if he could keep his symptoms under control but was unable to.  Denies any hematemesis, hematochezia, or melanotic stools.  Chest pain is not radiating anywhere.  The history is provided by the patient. No language interpreter was used.       Prior to Admission medications   Medication Sig Start Date End Date Taking? Authorizing Provider  albuterol  (PROVENTIL ) (2.5 MG/3ML) 0.083% nebulizer solution Take 3 mLs (2.5 mg total) by nebulization every 6 (six) hours as needed for wheezing or shortness of breath. 08/07/23  Yes Pickenpack-Cousar, Fannie SAILOR, NP  albuterol  (VENTOLIN  HFA) 108 (90 Base) MCG/ACT inhaler Inhale 1-2 puffs into the lungs every 4 (four) hours as needed for wheezing or shortness of breath. 01/31/24  Yes Pasam, Avinash, MD  cyanocobalamin  1000 MCG tablet Take 1 tablet (1,000 mcg total) by mouth daily. 10/14/23  Yes Pickenpack-Cousar, Fannie SAILOR, NP  DULoxetine  (CYMBALTA ) 20 MG capsule Take 1 capsule (20 mg total) by mouth daily. 02/07/24  Yes Lue Elsie BROCKS, MD  HYDROmorphone  (DILAUDID ) 2 MG tablet Take 1 tablet (2 mg total) by mouth every 4 (four) hours as needed for severe pain (pain score 7-10). 02/18/24  Yes Pickenpack-Cousar, Fannie SAILOR, NP  methadone  (DOLOPHINE ) 5 MG tablet Take 1 tablet (5 mg total) by mouth daily. 02/18/24   Yes Pickenpack-Cousar, Fannie SAILOR, NP  potassium chloride  (KLOR-CON ) 10 MEQ tablet Take 1 tablet (10 mEq total) by mouth daily for 20 doses. 02/04/24 03/03/24 Yes Trifan, Donnice PARAS, MD  thiamine  (VITAMIN B-1) 100 MG tablet Take 1 tablet (100 mg total) by mouth daily. 02/07/24  Yes Lue Elsie BROCKS, MD  amoxicillin -clavulanate (AUGMENTIN ) 875-125 MG tablet Take 1 tablet by mouth 2 (two) times daily. Patient not taking: Reported on 02/21/2024 02/12/24   Pasam, Chinita, MD  folic acid  (FOLVITE ) 1 MG tablet Take 1 tablet (1 mg total) by mouth daily. 10/14/23   Pickenpack-Cousar, Athena N, NP  pantoprazole  (PROTONIX ) 40 MG tablet Take 1 tablet (40 mg total) by mouth 2 (two) times daily. 01/02/24 03/13/24  [name], NP  temazepam  (RESTORIL ) 15 MG capsule Take 1 capsule (15 mg total) by mouth at bedtime as needed for sleep. Patient not taking: Reported on 02/21/2024 02/12/24   Pickenpack-Cousar, Athena N, NP    Allergies: Patient has no known allergies.    Review of Systems  Constitutional:  Negative for chills and fever.  Respiratory:  Negative for cough and shortness of breath.   Cardiovascular:  Positive for chest pain.  Gastrointestinal:  Positive for abdominal pain, nausea and vomiting.  Neurological:  Negative for light-headedness.  All other systems reviewed and are negative.   Updated Vital Signs BP (!) 138/90   Pulse 74   Temp 98 F (36.7 C)   Resp 15   SpO2 98%   Physical Exam  Vitals and nursing note reviewed.  Constitutional:      General: He is not in acute distress.    Appearance: Normal appearance. He is ill-appearing.  HENT:     Head: Normocephalic and atraumatic.     Nose: Nose normal.  Eyes:     Conjunctiva/sclera: Conjunctivae normal.  Cardiovascular:     Rate and Rhythm: Normal rate and regular rhythm.  Pulmonary:     Effort: Pulmonary effort is normal. No respiratory distress.  Abdominal:     General: There is no distension.     Palpations: Abdomen is soft.      Tenderness: There is abdominal tenderness. There is guarding.  Musculoskeletal:        General: No deformity. Normal range of motion.     Cervical back: Normal range of motion.  Skin:    Findings: No rash.  Neurological:     Mental Status: He is alert.     (all labs ordered are listed, but only abnormal results are displayed) Labs Reviewed  CBC WITH DIFFERENTIAL/PLATELET  COMPREHENSIVE METABOLIC PANEL WITH GFR  MAGNESIUM   LIPASE, BLOOD  I-STAT CHEM 8, ED  TROPONIN I (HIGH SENSITIVITY)    EKG: None  Radiology: DG Chest Portable 1 View Result Date: 02/21/2024 CLINICAL DATA:  chest pain Hx Gastric adenocarcinoma , last chemo two weeks ago EXAM: PORTABLE CHEST 1 VIEW COMPARISON:  Chest x-ray 02/02/2024, PET CT 02/03/2024, CT chest 02/03/2024 FINDINGS: Right chest wall Port-A-Cath with tip overlying the expected region of the superior caval junction. The heart and mediastinal contours are unchanged. Persistent prominent hilar regions consistent with known underlying lymphadenopathy. No focal consolidation. No pulmonary edema. No pleural effusion. No pneumothorax. No acute osseous abnormality. IMPRESSION: 1. No acute cardiopulmonary abnormality. 2. Persistent prominent hilar regions consistent with known underlying lymphadenopathy. Finding better evaluated on PET CT 12/30/2023 and CT angio chest 02/03/2024. Electronically Signed   By: Morgane  Naveau M.D.   On: 02/21/2024 22:32   CT ABDOMEN PELVIS W CONTRAST Result Date: 02/20/2024 CLINICAL DATA:  Concern for bowel obstruction. History of adenocarcinoma of the GE junction. EXAM: CT ABDOMEN AND PELVIS WITH CONTRAST TECHNIQUE: Multidetector CT imaging of the abdomen and pelvis was performed using the standard protocol following bolus administration of intravenous contrast. RADIATION DOSE REDUCTION: This exam was performed according to the departmental dose-optimization program which includes automated exposure control, adjustment of the mA  and/or kV according to patient size and/or use of iterative reconstruction technique. CONTRAST:  OMNIPAQUE  IOHEXOL  300 MG/ML  SOLN COMPARISON:  CT abdomen pelvis dated 02/03/2024. FINDINGS: Lower chest: Small bilateral pleural effusions. There is partial compressive atelectasis of the left lung base. No intra-abdominal free air. There is diffuse mesenteric edema and small ascites. Hepatobiliary: Small liver cysts. No biliary ductal dilatation. The gallbladder is unremarkable. Pancreas: The pancreas is atrophic for patient's age which may be sequela of chronic pancreatitis. Correlation with pancreatic enzymes recommended to exclude the possibility of acute on chronic pancreatitis. No dilatation of the main pancreatic duct. Spleen: Normal in size without focal abnormality. Adrenals/Urinary Tract: The adrenal glands unremarkable. There is no hydronephrosis on either side. The urinary bladder is grossly unremarkable. Stomach/Bowel: There is diffuse thickening of the gastric wall which may be related to ascites or represent gastritis. There is no bowel obstruction. The appendix is normal. Vascular/Lymphatic: The abdominal aorta and IVC unremarkable. No portal venous gas. Retroperitoneal adenopathy measure up to 16 mm in short axis to the left of the aorta at the level of the  renal vasculature concerning for metastatic adenopathy. The left para-aortic adenopathy demonstrate central low attenuation which may represent necrotic changes. These are relatively similar in size to the prior CT. Reproductive: The prostate and seminal vesicles are grossly unremarkable. Other: Diffuse subcutaneous edema and anasarca. Musculoskeletal: No acute osseous pathology. IMPRESSION: 1. No bowel obstruction. Normal appendix. 2. Diffuse mesenteric edema, anasarca, and small ascites. 3. Small bilateral pleural effusions. 4. Retroperitoneal adenopathy consistent with metastatic disease. 5. Diffuse thickening of the gastric wall may be  related to ascites or represent gastritis. An infiltrative process is not excluded. 6. Atrophic pancreas for patient's age and possibly secondary to chronic pancreatitis. Correlation with pancreatic enzymes recommended to exclude the possibility of acute on chronic pancreatitis. Electronically Signed   By: Vanetta Chou M.D.   On: 02/20/2024 18:21     Procedures   Medications Ordered in the ED  sodium chloride  0.9 % bolus 1,000 mL (has no administration in time range)  morphine  (PF) 4 MG/ML injection 4 mg (has no administration in time range)  ondansetron  (ZOFRAN ) injection 4 mg (has no administration in time range)                                    Medical Decision Making Amount and/or Complexity of Data Reviewed Labs: ordered. Radiology: ordered.  Risk Prescription drug management.   Medical Decision Making / ED Course   This patient presents to the ED for concern of abdominal pain, chest pain, this involves an extensive number of treatment options, and is a complaint that carries with it a high risk of complications and morbidity.  The differential diagnosis includes worsening metastatic disease, colitis, gastroenteritis, pancreatitis low suspicion for cholecystitis and appendicitis given history and recent imaging  MDM: 48 year old male presents today for concern of persistent abdominal pain and chest pain that developed today.  He appears chronically ill.  He states he should have took a p.o. for to be admitted yesterday just because of how miserable he has been at home.  Would like to pursue admission today due to uncontrolled symptoms. History of gastric adenocarcinoma.  Last treatment 2 weeks ago.  Will obtain lab work and add cardiac enzymes and chest x-ray.  Chest x-ray shows no acute cardiopulmonary process.  Will provide pain control and fluids.  At the end my shift labs are still pending.  Signed out to oncoming provider.  He will likely require admission once  labs result for symptom control.    Additional history obtained: -Additional history obtained from chart review -External records from outside source obtained and reviewed including: Chart review including previous notes, labs, imaging, consultation notes   Lab Tests: -I ordered, reviewed, and interpreted labs.   The pertinent results include:   Labs Reviewed  CBC WITH DIFFERENTIAL/PLATELET  COMPREHENSIVE METABOLIC PANEL WITH GFR  MAGNESIUM   LIPASE, BLOOD  I-STAT CHEM 8, ED  TROPONIN I (HIGH SENSITIVITY)      EKG  EKG Interpretation Date/Time:    Ventricular Rate:    PR Interval:    QRS Duration:    QT Interval:    QTC Calculation:   R Axis:      Text Interpretation:           Imaging Studies ordered: I ordered imaging studies including cxr I independently visualized and interpreted imaging. I agree with the radiologist interpretation   Medicines ordered and prescription drug management: Meds ordered this encounter  Medications   sodium chloride  0.9 % bolus 1,000 mL   morphine  (PF) 4 MG/ML injection 4 mg   ondansetron  (ZOFRAN ) injection 4 mg    -I have reviewed the patients home medicines and have made adjustments as needed   Reevaluation: After the interventions noted above, I reevaluated the patient and found that they have :stayed the same  Co morbidities that complicate the patient evaluation  Past Medical History:  Diagnosis Date   Asthma       Dispostion: Signed out to oncoming provider at the end of my shift.   Final diagnoses:  Abdominal pain, unspecified abdominal location    ED Discharge Orders     None          Hildegard Loge, DEVONNA 02/21/24 2336    Francesca Elsie CROME, MD 02/22/24 1432

## 2024-02-21 NOTE — ED Triage Notes (Signed)
 Cancer pt, was here yesterday and was going to be admitted, but chose to go home. Pt feels worse now- has numbness in bilateral feet and hands, chest pain, abdominal pain.

## 2024-02-22 ENCOUNTER — Encounter (HOSPITAL_COMMUNITY): Payer: Self-pay | Admitting: Internal Medicine

## 2024-02-22 ENCOUNTER — Other Ambulatory Visit: Payer: Self-pay

## 2024-02-22 DIAGNOSIS — J4520 Mild intermittent asthma, uncomplicated: Secondary | ICD-10-CM | POA: Diagnosis present

## 2024-02-22 DIAGNOSIS — Z79899 Other long term (current) drug therapy: Secondary | ICD-10-CM | POA: Diagnosis not present

## 2024-02-22 DIAGNOSIS — C799 Secondary malignant neoplasm of unspecified site: Secondary | ICD-10-CM | POA: Diagnosis present

## 2024-02-22 DIAGNOSIS — E876 Hypokalemia: Secondary | ICD-10-CM | POA: Diagnosis present

## 2024-02-22 DIAGNOSIS — E8809 Other disorders of plasma-protein metabolism, not elsewhere classified: Secondary | ICD-10-CM | POA: Diagnosis present

## 2024-02-22 DIAGNOSIS — Z8249 Family history of ischemic heart disease and other diseases of the circulatory system: Secondary | ICD-10-CM | POA: Diagnosis not present

## 2024-02-22 DIAGNOSIS — E162 Hypoglycemia, unspecified: Secondary | ICD-10-CM | POA: Diagnosis present

## 2024-02-22 DIAGNOSIS — C160 Malignant neoplasm of cardia: Secondary | ICD-10-CM

## 2024-02-22 DIAGNOSIS — Z87891 Personal history of nicotine dependence: Secondary | ICD-10-CM | POA: Diagnosis not present

## 2024-02-22 DIAGNOSIS — R9431 Abnormal electrocardiogram [ECG] [EKG]: Secondary | ICD-10-CM | POA: Diagnosis present

## 2024-02-22 DIAGNOSIS — Z66 Do not resuscitate: Secondary | ICD-10-CM | POA: Diagnosis present

## 2024-02-22 DIAGNOSIS — D638 Anemia in other chronic diseases classified elsewhere: Secondary | ICD-10-CM | POA: Diagnosis present

## 2024-02-22 DIAGNOSIS — G893 Neoplasm related pain (acute) (chronic): Secondary | ICD-10-CM | POA: Diagnosis present

## 2024-02-22 DIAGNOSIS — R112 Nausea with vomiting, unspecified: Secondary | ICD-10-CM | POA: Diagnosis present

## 2024-02-22 DIAGNOSIS — R1013 Epigastric pain: Secondary | ICD-10-CM

## 2024-02-22 DIAGNOSIS — K5900 Constipation, unspecified: Secondary | ICD-10-CM | POA: Diagnosis present

## 2024-02-22 DIAGNOSIS — R52 Pain, unspecified: Secondary | ICD-10-CM | POA: Diagnosis present

## 2024-02-22 DIAGNOSIS — Z79891 Long term (current) use of opiate analgesic: Secondary | ICD-10-CM | POA: Diagnosis not present

## 2024-02-22 DIAGNOSIS — E871 Hypo-osmolality and hyponatremia: Secondary | ICD-10-CM | POA: Diagnosis present

## 2024-02-22 DIAGNOSIS — R609 Edema, unspecified: Secondary | ICD-10-CM | POA: Diagnosis present

## 2024-02-22 DIAGNOSIS — R109 Unspecified abdominal pain: Secondary | ICD-10-CM | POA: Diagnosis present

## 2024-02-22 LAB — CBC WITH DIFFERENTIAL/PLATELET
Abs Immature Granulocytes: 0.02 K/uL (ref 0.00–0.07)
Abs Immature Granulocytes: 0.02 K/uL (ref 0.00–0.07)
Basophils Absolute: 0 K/uL (ref 0.0–0.1)
Basophils Absolute: 0 K/uL (ref 0.0–0.1)
Basophils Relative: 0 %
Basophils Relative: 1 %
Eosinophils Absolute: 0 K/uL (ref 0.0–0.5)
Eosinophils Absolute: 0.1 K/uL (ref 0.0–0.5)
Eosinophils Relative: 0 %
Eosinophils Relative: 1 %
HCT: 34.6 % — ABNORMAL LOW (ref 39.0–52.0)
HCT: 36.8 % — ABNORMAL LOW (ref 39.0–52.0)
Hemoglobin: 11 g/dL — ABNORMAL LOW (ref 13.0–17.0)
Hemoglobin: 11.4 g/dL — ABNORMAL LOW (ref 13.0–17.0)
Immature Granulocytes: 0 %
Immature Granulocytes: 0 %
Lymphocytes Relative: 18 %
Lymphocytes Relative: 19 %
Lymphs Abs: 0.8 K/uL (ref 0.7–4.0)
Lymphs Abs: 1.1 K/uL (ref 0.7–4.0)
MCH: 26.3 pg (ref 26.0–34.0)
MCH: 26.8 pg (ref 26.0–34.0)
MCHC: 31 g/dL (ref 30.0–36.0)
MCHC: 31.8 g/dL (ref 30.0–36.0)
MCV: 84.2 fL (ref 80.0–100.0)
MCV: 84.8 fL (ref 80.0–100.0)
Monocytes Absolute: 0.6 K/uL (ref 0.1–1.0)
Monocytes Absolute: 0.7 K/uL (ref 0.1–1.0)
Monocytes Relative: 12 %
Monocytes Relative: 13 %
Neutro Abs: 3.3 K/uL (ref 1.7–7.7)
Neutro Abs: 3.7 K/uL (ref 1.7–7.7)
Neutrophils Relative %: 67 %
Neutrophils Relative %: 69 %
Platelets: 170 K/uL (ref 150–400)
Platelets: 177 K/uL (ref 150–400)
RBC: 4.11 MIL/uL — ABNORMAL LOW (ref 4.22–5.81)
RBC: 4.34 MIL/uL (ref 4.22–5.81)
RDW: 16 % — ABNORMAL HIGH (ref 11.5–15.5)
RDW: 16 % — ABNORMAL HIGH (ref 11.5–15.5)
WBC: 4.8 K/uL (ref 4.0–10.5)
WBC: 5.6 K/uL (ref 4.0–10.5)
nRBC: 0 % (ref 0.0–0.2)
nRBC: 0 % (ref 0.0–0.2)

## 2024-02-22 LAB — COMPREHENSIVE METABOLIC PANEL WITH GFR
ALT: 9 U/L (ref 0–44)
ALT: 9 U/L (ref 0–44)
AST: 13 U/L — ABNORMAL LOW (ref 15–41)
AST: 15 U/L (ref 15–41)
Albumin: 2.3 g/dL — ABNORMAL LOW (ref 3.5–5.0)
Albumin: 2.5 g/dL — ABNORMAL LOW (ref 3.5–5.0)
Alkaline Phosphatase: 52 U/L (ref 38–126)
Alkaline Phosphatase: 53 U/L (ref 38–126)
Anion gap: 11 (ref 5–15)
Anion gap: 12 (ref 5–15)
BUN: 12 mg/dL (ref 6–20)
BUN: 12 mg/dL (ref 6–20)
CO2: 22 mmol/L (ref 22–32)
CO2: 22 mmol/L (ref 22–32)
Calcium: 8 mg/dL — ABNORMAL LOW (ref 8.9–10.3)
Calcium: 8.2 mg/dL — ABNORMAL LOW (ref 8.9–10.3)
Chloride: 100 mmol/L (ref 98–111)
Chloride: 101 mmol/L (ref 98–111)
Creatinine, Ser: 0.54 mg/dL — ABNORMAL LOW (ref 0.61–1.24)
Creatinine, Ser: 0.61 mg/dL (ref 0.61–1.24)
GFR, Estimated: >60 mL/min (ref >60)
GFR, Estimated: >60 mL/min (ref >60)
Glucose, Bld: 55 mg/dL — ABNORMAL LOW (ref 70–99)
Glucose, Bld: 63 mg/dL — ABNORMAL LOW (ref 70–99)
Potassium: 3.8 mmol/L (ref 3.5–5.1)
Potassium: 4.2 mmol/L (ref 3.5–5.1)
Sodium: 133 mmol/L — ABNORMAL LOW (ref 135–145)
Sodium: 135 mmol/L (ref 135–145)
Total Bilirubin: 0.8 mg/dL (ref 0.0–1.2)
Total Bilirubin: 1.2 mg/dL (ref 0.0–1.2)
Total Protein: 4.8 g/dL — ABNORMAL LOW (ref 6.5–8.1)
Total Protein: 5 g/dL — ABNORMAL LOW (ref 6.5–8.1)

## 2024-02-22 LAB — GLUCOSE, CAPILLARY
Glucose-Capillary: 73 mg/dL (ref 70–99)
Glucose-Capillary: 66 mg/dL — ABNORMAL LOW (ref 70–99)
Glucose-Capillary: 62 mg/dL — ABNORMAL LOW (ref 70–99)
Glucose-Capillary: 142 mg/dL — ABNORMAL HIGH (ref 70–99)
Glucose-Capillary: 143 mg/dL — ABNORMAL HIGH (ref 70–99)

## 2024-02-22 LAB — PROTIME-INR
INR: 1.1 (ref 0.8–1.2)
Prothrombin Time: 14.4 s (ref 11.4–15.2)

## 2024-02-22 LAB — MAGNESIUM
Magnesium: 1.8 mg/dL (ref 1.7–2.4)
Magnesium: 1.8 mg/dL (ref 1.7–2.4)

## 2024-02-22 LAB — CBG MONITORING, ED
Glucose-Capillary: 48 mg/dL — ABNORMAL LOW (ref 70–99)
Glucose-Capillary: 153 mg/dL — ABNORMAL HIGH (ref 70–99)

## 2024-02-22 LAB — LIPASE, BLOOD: Lipase: 18 U/L (ref 11–51)

## 2024-02-22 LAB — TROPONIN I (HIGH SENSITIVITY): Troponin I (High Sensitivity): 4 ng/L (ref <18)

## 2024-02-22 MED ORDER — HYDROMORPHONE HCL 1 MG/ML IJ SOLN: 0.5000 mg | INTRAMUSCULAR | Status: DC | PRN

## 2024-02-22 MED ORDER — HYDROMORPHONE HCL 1 MG/ML IJ SOLN
0.5000 mg | Freq: Once | INTRAMUSCULAR | Status: AC
  Administered 2024-02-22: 0.5 mg via INTRAVENOUS
  Filled 2024-02-22: qty 1

## 2024-02-22 MED ORDER — PROCHLORPERAZINE EDISYLATE 10 MG/2ML IJ SOLN: 10.0000 mg | Freq: Four times a day (QID) | INTRAMUSCULAR | Status: DC | PRN

## 2024-02-22 MED ORDER — DIAZEPAM 5 MG/ML IJ SOLN
5.0000 mg | INTRAMUSCULAR | Status: DC | PRN
  Administered 2024-02-23: 5 mg via INTRAVENOUS
  Filled 2024-02-22: qty 2

## 2024-02-22 MED ORDER — DIAZEPAM 5 MG/ML IJ SOLN
2.5000 mg | Freq: Once | INTRAMUSCULAR | Status: AC
  Administered 2024-02-22: 2.5 mg via INTRAVENOUS
  Filled 2024-02-22: qty 2

## 2024-02-22 MED ORDER — MELATONIN 3 MG PO TABS
3.0000 mg | ORAL_TABLET | Freq: Every evening | ORAL | Status: DC | PRN
  Administered 2024-02-23 (×2): 3 mg via ORAL
  Filled 2024-02-22 (×2): qty 1

## 2024-02-22 MED ORDER — POTASSIUM CHLORIDE 10 MEQ/100ML IV SOLN
10.0000 meq | INTRAVENOUS | Status: AC
  Administered 2024-02-22 (×4): 10 meq via INTRAVENOUS
  Filled 2024-02-22 (×4): qty 100

## 2024-02-22 MED ORDER — POLYETHYLENE GLYCOL 3350 17 G PO PACK
17.0000 g | PACK | Freq: Two times a day (BID) | ORAL | Status: DC
  Filled 2024-02-22 (×2): qty 1

## 2024-02-22 MED ORDER — ACETAMINOPHEN 325 MG PO TABS
650.0000 mg | ORAL_TABLET | Freq: Four times a day (QID) | ORAL | Status: DC | PRN
  Filled 2024-02-22: qty 2

## 2024-02-22 MED ORDER — ALBUTEROL SULFATE (2.5 MG/3ML) 0.083% IN NEBU: 2.5000 mg | INHALATION_SOLUTION | RESPIRATORY_TRACT | Status: DC | PRN

## 2024-02-22 MED ORDER — LACTATED RINGERS IV SOLN: INTRAVENOUS | Status: DC

## 2024-02-22 MED ORDER — PROCHLORPERAZINE EDISYLATE 10 MG/2ML IJ SOLN
10.0000 mg | Freq: Four times a day (QID) | INTRAMUSCULAR | Status: DC | PRN
  Administered 2024-02-22 – 2024-02-24 (×5): 10 mg via INTRAVENOUS
  Filled 2024-02-22 (×5): qty 2

## 2024-02-22 MED ORDER — HYDROMORPHONE HCL 1 MG/ML IJ SOLN
1.0000 mg | INTRAMUSCULAR | Status: DC | PRN
  Administered 2024-02-22 – 2024-02-23 (×7): 1 mg via INTRAVENOUS
  Filled 2024-02-22 (×7): qty 1

## 2024-02-22 MED ORDER — PANTOPRAZOLE SODIUM 40 MG IV SOLR
40.0000 mg | Freq: Two times a day (BID) | INTRAVENOUS | Status: DC
  Administered 2024-02-22 – 2024-02-24 (×5): 40 mg via INTRAVENOUS
  Filled 2024-02-22 (×5): qty 10

## 2024-02-22 MED ORDER — ONDANSETRON HCL 4 MG/2ML IJ SOLN: 4.0000 mg | Freq: Four times a day (QID) | INTRAMUSCULAR | Status: DC | PRN

## 2024-02-22 MED ORDER — MAGNESIUM SULFATE 2 GM/50ML IV SOLN
2.0000 g | Freq: Once | INTRAVENOUS | Status: AC
  Administered 2024-02-22: 2 g via INTRAVENOUS
  Filled 2024-02-22: qty 50

## 2024-02-22 MED ORDER — DOCUSATE SODIUM 100 MG PO CAPS
100.0000 mg | ORAL_CAPSULE | Freq: Two times a day (BID) | ORAL | Status: DC
  Filled 2024-02-22 (×3): qty 1

## 2024-02-22 MED ORDER — DEXTROSE 50 % IV SOLN
1.0000 | INTRAVENOUS | Status: DC | PRN
  Administered 2024-02-22 (×2): 50 mL via INTRAVENOUS
  Filled 2024-02-22 (×2): qty 50

## 2024-02-22 MED ORDER — KCL IN DEXTROSE-NACL 10-5-0.45 MEQ/L-%-% IV SOLN
INTRAVENOUS | Status: AC
  Filled 2024-02-22 (×3): qty 1000

## 2024-02-22 MED ORDER — KCL IN DEXTROSE-NACL 20-5-0.45 MEQ/L-%-% IV SOLN
INTRAVENOUS | Status: DC
  Filled 2024-02-22: qty 1000

## 2024-02-22 MED ORDER — NALOXONE HCL 0.4 MG/ML IJ SOLN: 0.4000 mg | INTRAMUSCULAR | Status: DC | PRN

## 2024-02-22 MED ORDER — ACETAMINOPHEN 650 MG RE SUPP: 650.0000 mg | Freq: Four times a day (QID) | RECTAL | Status: DC | PRN

## 2024-02-22 NOTE — Progress Notes (Signed)
 Hypoglycemic Event  CBG: 66  Treatment: 4 OZ juice  Symptoms: None  Follow-up CBG: Time: CBG Result:1600  Possible Reasons for Event: Inadequate meal intake  Comments/MD notified:See new orders, Followed up with D50 IV per order, Rechecked CBG 142. Encourage PO Intake. D5 fluids administered.    Asberry JINNY Pacer

## 2024-02-22 NOTE — ED Provider Notes (Signed)
  Physical Exam  BP 134/86   Pulse (!) 57   Temp 98.4 F (36.9 C)   Resp 12   SpO2 98%   Physical Exam  Procedures  Procedures  ED Course / MDM    Medical Decision Making Amount and/or Complexity of Data Reviewed Labs: ordered. Radiology: ordered.  Risk Prescription drug management. Decision regarding hospitalization.   Care of the patient assumed from preceding ED provider on Ali PA-C temperature change.  Please see his associated note for further intake with the patient's ED course.  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  Additional chest pain.  Seen yesterday for same and offered admission, however wanted to try oral therapies and an effort to avoid admission.  Unfortunately patient's symptoms have not improved at home and he is no longer tolerating p.o. prompting return to the emergency department.  Plan at time of shift changes for review of laboratory studies which are pending, and admission for symptom management.  CBC with baseline anemia, otherwise unremarkable.  CMP with low albumin of 2.5 but otherwise reassuring.  Troponin is negative, chest x-ray without acute cardiopulmonary disease.  Patient's pain persisting despite morphine , will offer Dilaudid ; patient is on home Dilaudid  and methadone . Admission call pending.   Per preceding ED provider, in his discussion with the patient, Gareth is endorsing signed DNR/DNI, however pursuing all other treatment at this time.  Consult to hospitalist Dr. Kayla or who is amenable to admitting this patient to his service.  I appreciate his collaboration in the care of this patient.   Jatavion  voiced understanding of his medical evaluation and treatment plan. Each of their questions answered to their expressed satisfaction.  He is amenable to plan  for admission at this time.   This chart was dictated using voice recognition software, Dragon. Despite the best efforts of this provider to proofread and correct errors, errors may still occur which can change documentation meaning.    Bobette Pleasant SAUNDERS, PA-C 02/22/24 0329    Trine Raynell Moder, MD 02/22/24 [phone]

## 2024-02-22 NOTE — Progress Notes (Addendum)
 PROGRESS NOTE  Jesus Haynes FMW:[id number] DOB: 1975-09-30 DOA: 02/21/2024 PCP: [name], NP   LOS: 0 days   Brief narrative:   Jesus Haynes is a 48 y.o. male with medical history significant for metastatic gastric adenocarcinoma undergoing chemotherapy, mild intermittent asthma, anemia of chronic disease with baseline hemoglobin 9-12, presented to hospital with complaints of  sharp, nonradiating epigastric discomfort, with nausea and vomiting.  Patient had a ED visit on 02/20/2024, he underwent CT abdomen/pelvis with contrast, which was reported to show diffuse mesenteric edema, but no evidence to suggest gastric perforation or bowel obstruction patient was advised admission for pain control but had refused and had gone home.  In the ED patient had stable vitals.  Labs were notable for mild hyponatremia with sodium of 135.  CBC within normal range.  EKG showed normal sinus rhythm with QTc prolongation..  Chest x-ray without any infiltrate.  In the ED patient received Dilaudid  morphine  Zofran  Protonix  normal saline and was considered for admission to hospital for further evaluation and treatment for abdominal pain with intractable nausea vomiting.     Assessment/Plan: Principal Problem:   Abdominal pain Active Problems:   Adenocarcinoma of gastroesophageal junction (HCC)   Intractable nausea and vomiting   Hypoglycemia   Prolonged QT interval   Mild intermittent asthma   Anemia of chronic disease   Intractable epigastric pain:  History of metastatic gastric adenocarcinoma.  Recent CT abdomen/pelvis with contrast performed on 02/20/2024 demonstrating evidence of diffuse mesenteric edema.  Has been constipated with nausea, vomiting.  Offered Dulcolax suppository but does not wish to be on it.  Will continue with the stool softeners and bowel regimen.  LFTs and lipase normal.  Continue bowel regimen IV Dilaudid  antiemetics and supportive care.  Continue Protonix  IV twice daily.  On  clears at this time.    Intractable nausea/vomiting:  3-4 episodes for last 4 to 5 days with decreased oral intake and oral tolerance.  Has metastatic gastric carcinoma undergoing chemotherapy treatment.  CT abdomen/pelvis performed on 02/20/2024 showed no evidence of obstruction or any evidence of bowel/gastric perforation.  Continue: Prn IV Compazine , prn IV Valium  for refractory nausea/vomiting .  Continuous lactated Ringer 's for hydration..  Monitor QTc.    Hypoglycemia: On initial presentation.  Likely secondary to nausea vomiting and poor oral intake.  Will change to D5 half-normal saline with potassium.   QTc prolongation: Initial EKG demonstrates QTc of  542  ms, which is notable in the context of his presenting intractable nausea/vomiting. outpatient medications that may be contributing to QTc prolongation: Cymbalta , methadone .  Presenting magnesium  level 1.8, will potassium is 3.8.  Continue to monitor in telemetry.  Received electrolyte supplementation.  Holding Cymbalta  and methadone .  Will give additional 2 g of IV magnesium  sulfate today.  Check EKG in AM.   Metastatic gastric adenocarcinoma:  Undergoing chemotherapy.  Followed by oncologist Dr. Chinita Pasam,      Mild intermittent asthma.:  Continue albuterol .  No acute issues.     Anemia of chronic disease: baseline hgb range 9-12 will continue to monitor.  Goals of care discussion.  Patient is DNR resuscitate.  Patient with intractable pain nausea vomiting with metastatic cancer.  I have been told patient's mother is trying to reach the [facility] to inquire about hospice services.  Will follow on that.  DVT prophylaxis: SCDs Start: 02/22/24 0203   Disposition: Home likely in 1 to 2 days  Status is: Observation  The patient will require care spanning >  2 midnights and should be moved to inpatient because: Pending clinical improvement, intractable nausea vomiting and abdominal pain, IV narcotics, IV fluids, hyperglycemia.    Code  Status:     Code Status: Limited: Do not attempt resuscitation (DNR) -DNR-LIMITED -Do Not Intubate/DNI   Family Communication: None at bedside.  I tried to reach the patient stepmom on 02/22/2024 but was unable to reach.  Consultants: None  Procedures: None  Anti-infectives:  None  Anti-infectives (From admission, onward)    None       Subjective: Today, patient was seen and examined at bedside.  He states that with IV Dilaudid  his pain is slightly better but still has dry heaves and nausea without vomiting.  Trying to be on clears.  Does not wish to try any suppository for pain or nausea.  Denies any shortness of breath dyspnea cough congestion or shortness of breath.    Objective: Vitals:   02/22/24 0813 02/22/24 1230  BP: 119/84 (!) 129/91  Pulse: (!) 53 66  Resp: 17   Temp: 97.8 F (36.6 C) 98 F (36.7 C)  SpO2: 96%     Intake/Output Summary (Last 24 hours) at 02/22/2024 1518 Last data filed at 02/22/2024 1403 Gross per 24 hour  Intake 420 ml  Output --  Net 420 ml   There were no vitals filed for this visit. There is no height or weight on file to calculate BMI.   Physical Exam:  There is no height or weight on file to calculate BMI.  GENERAL: Patient is alert awake and oriented. Not in obvious distress.  Thinly built HENT: No scleral pallor or icterus. Pupils equally reactive to light. Oral mucosa is moist NECK: is supple, no gross swelling noted. CHEST: Clear to auscultation. No crackles or wheezes.  Chest wall port on the right side. CVS: S1 and S2 heard, no murmur. Regular rate and rhythm.  ABDOMEN: Soft, epigastric region tenderness on palpation with surgical incision, bowel sounds are present. EXTREMITIES: No edema. CNS: Cranial nerves are intact. No focal motor deficits. SKIN: warm and dry without rashes.  Data Review: I have personally reviewed the following laboratory data and studies,  CBC: Recent Labs  Lab 02/20/24 1255 02/21/24 2343  02/21/24 2345 02/22/24 0717  WBC 4.9  --  4.8 5.6  NEUTROABS  --   --  3.3 3.7  HGB 11.7* 11.6* 11.0* 11.4*  HCT 38.8* 34.0* 34.6* 36.8*  MCV 85.3  --  84.2 84.8  PLT 165  --  170 177   Basic Metabolic Panel: Recent Labs  Lab 02/20/24 1255 02/21/24 2343 02/21/24 2345 02/22/24 0717  NA 134* 134* 135 133*  K 3.8 3.9 3.8 4.2  CL 103 97* 101 100  CO2 19*  --  22 22  GLUCOSE 291* 59* 63* 55*  BUN 11 10 12 12   CREATININE 0.63 0.50* 0.61 0.54*  CALCIUM  7.5*  --  8.2* 8.0*  MG  --   --  1.8 1.8   Liver Function Tests: Recent Labs  Lab 02/20/24 1255 02/21/24 2345 02/22/24 0717  AST 16 15 13*  ALT 9 9 9   ALKPHOS 52 53 52  BILITOT 1.3* 1.2 0.8  PROT 5.0* 5.0* 4.8*  ALBUMIN 2.4* 2.5* 2.3*   Recent Labs  Lab 02/20/24 1255 02/21/24 2345  LIPASE 18 18   No results for input(s): AMMONIA in the last 168 hours. Cardiac Enzymes: No results for input(s): CKTOTAL, CKMB, CKMBINDEX, TROPONINI in the last 168 hours. BNP (last 3  results) No results for input(s): BNP in the last 8760 hours.  ProBNP (last 3 results) No results for input(s): PROBNP in the last 8760 hours.  CBG: Recent Labs  Lab 02/22/24 0639 02/22/24 0739 02/22/24 1227  GLUCAP 48* 153* 73   No results found for this or any previous visit (from the past 240 hours).   Studies: DG Chest Portable 1 View Result Date: 02/21/2024 CLINICAL DATA:  chest pain Hx Gastric adenocarcinoma , last chemo two weeks ago EXAM: PORTABLE CHEST 1 VIEW COMPARISON:  Chest x-ray 02/02/2024, PET CT 02/03/2024, CT chest 02/03/2024 FINDINGS: Right chest wall Port-A-Cath with tip overlying the expected region of the superior caval junction. The heart and mediastinal contours are unchanged. Persistent prominent hilar regions consistent with known underlying lymphadenopathy. No focal consolidation. No pulmonary edema. No pleural effusion. No pneumothorax. No acute osseous abnormality. IMPRESSION: 1. No acute cardiopulmonary  abnormality. 2. Persistent prominent hilar regions consistent with known underlying lymphadenopathy. Finding better evaluated on PET CT 12/30/2023 and CT angio chest 02/03/2024. Electronically Signed   By: Morgane  Naveau M.D.   On: 02/21/2024 22:32   CT ABDOMEN PELVIS W CONTRAST Result Date: 02/20/2024 CLINICAL DATA:  Concern for bowel obstruction. History of adenocarcinoma of the GE junction. EXAM: CT ABDOMEN AND PELVIS WITH CONTRAST TECHNIQUE: Multidetector CT imaging of the abdomen and pelvis was performed using the standard protocol following bolus administration of intravenous contrast. RADIATION DOSE REDUCTION: This exam was performed according to the departmental dose-optimization program which includes automated exposure control, adjustment of the mA and/or kV according to patient size and/or use of iterative reconstruction technique. CONTRAST:  OMNIPAQUE  IOHEXOL  300 MG/ML  SOLN COMPARISON:  CT abdomen pelvis dated 02/03/2024. FINDINGS: Lower chest: Small bilateral pleural effusions. There is partial compressive atelectasis of the left lung base. No intra-abdominal free air. There is diffuse mesenteric edema and small ascites. Hepatobiliary: Small liver cysts. No biliary ductal dilatation. The gallbladder is unremarkable. Pancreas: The pancreas is atrophic for patient's age which may be sequela of chronic pancreatitis. Correlation with pancreatic enzymes recommended to exclude the possibility of acute on chronic pancreatitis. No dilatation of the main pancreatic duct. Spleen: Normal in size without focal abnormality. Adrenals/Urinary Tract: The adrenal glands unremarkable. There is no hydronephrosis on either side. The urinary bladder is grossly unremarkable. Stomach/Bowel: There is diffuse thickening of the gastric wall which may be related to ascites or represent gastritis. There is no bowel obstruction. The appendix is normal. Vascular/Lymphatic: The abdominal aorta and IVC unremarkable. No  portal venous gas. Retroperitoneal adenopathy measure up to 16 mm in short axis to the left of the aorta at the level of the renal vasculature concerning for metastatic adenopathy. The left para-aortic adenopathy demonstrate central low attenuation which may represent necrotic changes. These are relatively similar in size to the prior CT. Reproductive: The prostate and seminal vesicles are grossly unremarkable. Other: Diffuse subcutaneous edema and anasarca. Musculoskeletal: No acute osseous pathology. IMPRESSION: 1. No bowel obstruction. Normal appendix. 2. Diffuse mesenteric edema, anasarca, and small ascites. 3. Small bilateral pleural effusions. 4. Retroperitoneal adenopathy consistent with metastatic disease. 5. Diffuse thickening of the gastric wall may be related to ascites or represent gastritis. An infiltrative process is not excluded. 6. Atrophic pancreas for patient's age and possibly secondary to chronic pancreatitis. Correlation with pancreatic enzymes recommended to exclude the possibility of acute on chronic pancreatitis. Electronically Signed   By: Vanetta Chou M.D.   On: 02/20/2024 18:21      Vernal Alstrom, MD  [facility] 02/22/2024  If 7PM-7AM, please contact night-coverage

## 2024-02-22 NOTE — ED Notes (Signed)
 Patient provided with orange juice to drink at this time to help with blood sugar but he refused the orange juice and trying to drink apple juice. Provider aware of blood sugar 48.

## 2024-02-22 NOTE — TOC Progression Note (Signed)
 Transition of Care [facility]) - Progression Note    Patient Details  Name: Jesus Haynes MRN: [id number] Date of Birth: June 27, 1976  Transition of Care [facility] CM/SW Contact  [name], RN Phone Number: 02/22/2024, 4:07 PM  Clinical Narrative:    Notified by [name] pt's mother in law reached out to [facility] to inquire about hospice services; she will call her back to answer questions she may have; TOC is following.                     Expected Discharge Plan and Services                                               Social Drivers of Health (SDOH) Interventions SDOH Screenings   Food Insecurity: No Food Insecurity (02/05/2024)  Housing: Low Risk  (02/05/2024)  Transportation Needs: No Transportation Needs (02/05/2024)  Utilities: Not At Risk (02/05/2024)  Depression (PHQ2-9): Low Risk  (01/30/2024)  Recent Concern: Depression (PHQ2-9) - Medium Risk (01/22/2024)  Social Connections: Unknown (07/30/2023)  Tobacco Use: Medium Risk (02/22/2024)    Readmission Risk Interventions    07/31/2023    3:41 PM  Readmission Risk Prevention Plan  Transportation Screening Complete  Medication Review (RN Care Manager) Complete  HRI or Home Care Consult Complete  SW Recovery Care/Counseling Consult Complete  Palliative Care Screening Not Applicable  Skilled Nursing Facility Not Applicable

## 2024-02-22 NOTE — H&P (Signed)
 History and Physical      Jesus Haynes FMW:[id number] DOB: 02-Nov-1975 DOA: 02/21/2024; DOS: 02/22/2024  PCP: [name], NP  Patient coming from: home   I have personally briefly reviewed patient's old medical records in [facility]  Chief Complaint: abd pain  HPI: Jesus Haynes is a 48 y.o. male with medical history significant for metastatic gastric adenocarcinoma undergoing chemotherapy, mild intermittent asthma, anemia of chronic disease with baseline hemoglobin 9-12, who is admitted to [facility] on 02/21/2024 with intractable abdominal pain after presenting from home to [facility] ED complaining of abdominal pain.   Patient with metastatic gastric adenocarcinoma for which she is undergoing chemotherapy, most recent of which occurred 2 weeks ago. follows with  Dr. Chinita Patten as his outpatient oncologist.   He had presented to [facility] emergency department 1 02/20/2024 complaining of sharp, nonradiating epigastric discomfort, worse with palpation.  At that time, he also noted recurrent nausea resulting in at least 2-3 daily episodes of nonbloody, nonbilious emesis.  At the time of his ED visit on 02/20/2024, he underwent CT abdomen/pelvis with contrast, which was reported to show diffuse mesenteric edema, but no evidence to suggest gastric perforation or bowel obstruction.  He was offered admission at that time for optimization of pain control, further management of his nausea/vomiting as well as IV rehydration.  However, patient refused at that time of this offer for admission, preferring to attempt to achieve pain and antiemetic control at home.  However, he presents back to the emergency department this evening complaining of persistent interval sharp, nonradiating epigastric discomfort, worse with palpation.  He also notes persistence of his nausea resulting in 3-4 daily episodes of nonbloody, nonbilious emesis in the interval, and conveys that he has been able to  tolerate very little oral intake in the interval since his presentation to the ED on 02/20/2024.  Included in this, he is in finding a difficult to keep down his outpatient oral analgesic medications, which include p.o. prn Dilaudid .  Denies any associated subjective fever, chills, rigors, or generalized myalgias.  He is now amenable to admission for pain control in the setting of presenting intractable epigastric discomfort as well as optimization of antiemetic intervention in the setting of intractable nausea/vomiting.     ED Course:  Vital signs in the ED were notable for the following: Afebrile; heart rates in the high 50s to 70s; systolic blood pressures in the 130s; respiratory rate 15, oxygen  saturation 98 to 100% on room air.  Labs were notable for the following: CMP was notable for the following: Sodium 135, potassium 3.8, bicarbonate 22, BUN 12, creatinine 0.61 compared to 0.63 on 02/20/2024, glucose 63, calcium  adjusted for mild hypoalbuminemia noted to be 9.4, albumin 2.5.  Otherwise, liver enzymes within normal limits.  Lipase 18.  Troponin 4.  Magnesium  level 1.8.  CBC notable for open cell count 4800, hemoglobin 11.0 associated with Neuraceq/Norocarp properties and relative to most recent prior hemoglobin data point of 11.7 on 02/20/2024, platelet count 170.  Per my interpretation, EKG in ED demonstrated the following: Sinus rhythm with heart rate 62, prolonged QTc of 542, nonspecific T wave inversion in V3, and no evidence of ST changes, including no evidence of ST elevation.  Imaging in the ED, per corresponding formal radiology read, was notable for the following: 1 view chest x-ray showed no evidence of acute cardiopulmonary process, no evidence of infiltrate, edema, effusion, or pneumothorax.  While in the ED, the following were administered:  Dilaudid  0.5 mg IV x 1 dose, morphine  4 mg IV x 1 dose, Zofran  4 mg IV x 1 dose, Protonix  40 mg IV x 1 dose, normal saline x 1 L  bolus.  Subsequently, the patient was admitted for further evaluation and management of intractable abdominal discomfort as well as intractable nausea/vomiting, with presenting labs also notable for hypoglycemia and presenting EKG demonstrating evidence of QTc prolongation.    Review of Systems: As per HPI otherwise 10 point review of systems negative.   Past Medical History:  Diagnosis Date   Asthma    Gastric adenocarcinoma [facility])     Past Surgical History:  Procedure Laterality Date   BIOPSY  05/28/2023   Procedure: BIOPSY;  Surgeon: Rosalie Kitchens, MD;  Location: [facility] ENDOSCOPY;  Service: Gastroenterology;;   ESOPHAGOGASTRODUODENOSCOPY (EGD) WITH PROPOFOL  N/A 05/28/2023   Procedure: ESOPHAGOGASTRODUODENOSCOPY (EGD) WITH PROPOFOL ;  Surgeon: Rosalie Kitchens, MD;  Location: [facility] ENDOSCOPY;  Service: Gastroenterology;  Laterality: N/A;   IR IMAGING GUIDED PORT INSERTION  06/13/2023   UMBILICAL HERNIA REPAIR N/A 06/18/2023   Procedure: PLACEMENT OF J TUBE;  Surgeon: Dasie Leonor CROME, MD;  Location: [facility] ORS;  Service: General;  Laterality: N/A;    Social History:  reports that he quit smoking about 11 years ago. His smoking use included cigarettes. He has never used smokeless tobacco. He reports that he does not drink alcohol and does not use drugs.   No Known Allergies  Family History  Problem Relation Age of Onset   Heart failure Father      Prior to Admission medications   Medication Sig Start Date End Date Taking? Authorizing Provider  albuterol  (PROVENTIL ) (2.5 MG/3ML) 0.083% nebulizer solution Take 3 mLs (2.5 mg total) by nebulization every 6 (six) hours as needed for wheezing or shortness of breath. 08/07/23  Yes Pickenpack-Cousar, Fannie SAILOR, NP  albuterol  (VENTOLIN  HFA) 108 (90 Base) MCG/ACT inhaler Inhale 1-2 puffs into the lungs every 4 (four) hours as needed for wheezing or shortness of breath. 01/31/24  Yes Pasam, Avinash, MD  cyanocobalamin  1000 MCG tablet Take 1 tablet (1,000 mcg  total) by mouth daily. 10/14/23  Yes Pickenpack-Cousar, Fannie SAILOR, NP  DULoxetine  (CYMBALTA ) 20 MG capsule Take 1 capsule (20 mg total) by mouth daily. 02/07/24  Yes Lue Elsie BROCKS, MD  HYDROmorphone  (DILAUDID ) 2 MG tablet Take 1 tablet (2 mg total) by mouth every 4 (four) hours as needed for severe pain (pain score 7-10). 02/18/24  Yes Pickenpack-Cousar, Fannie SAILOR, NP  methadone  (DOLOPHINE ) 5 MG tablet Take 1 tablet (5 mg total) by mouth daily. 02/18/24  Yes Pickenpack-Cousar, Fannie SAILOR, NP  potassium chloride  (KLOR-CON ) 10 MEQ tablet Take 1 tablet (10 mEq total) by mouth daily for 20 doses. 02/04/24 03/03/24 Yes Trifan, Donnice PARAS, MD  thiamine  (VITAMIN B-1) 100 MG tablet Take 1 tablet (100 mg total) by mouth daily. 02/07/24  Yes Lue Elsie BROCKS, MD  amoxicillin -clavulanate (AUGMENTIN ) 875-125 MG tablet Take 1 tablet by mouth 2 (two) times daily. Patient not taking: Reported on 02/21/2024 02/12/24   Pasam, Chinita, MD  folic acid  (FOLVITE ) 1 MG tablet Take 1 tablet (1 mg total) by mouth daily. 10/14/23   Pickenpack-Cousar, Athena N, NP  pantoprazole  (PROTONIX ) 40 MG tablet Take 1 tablet (40 mg total) by mouth 2 (two) times daily. 01/02/24 03/13/24  [name], NP  temazepam  (RESTORIL ) 15 MG capsule Take 1 capsule (15 mg total) by mouth at bedtime as needed for sleep. Patient not taking: Reported on 02/21/2024 02/12/24  Pickenpack-Cousar, Fannie SAILOR, NP     Objective    Physical Exam: Vitals:   02/21/24 2100 02/22/24 0104  BP: (!) 138/90 134/86  Pulse: 74 (!) 57  Resp: 15 12  Temp: 98 F (36.7 C) 98.4 F (36.9 C)  SpO2: 98% 98%    General: appears to be stated age; alert, oriented Skin: warm, dry, no rash Head:  AT/[place] Mouth:  Oral mucosa membranes appear dry, normal dentition Neck: supple; trachea midline Chest: right-sided Port-A-Cath noted. Heart:  RRR; did not appreciate any M/R/G Lungs: CTAB, did not appreciate any wheezes, rales, or rhonchi Abdomen: + BS; soft, ND, mild  tenderness over the epigastrium, in the absence of associated guarding, rigidity, or rebound tenderness. Vascular: 2+ pedal pulses b/l; 2+ radial pulses b/l Extremities: no peripheral edema, no muscle wasting     Labs on Admission: I have personally reviewed following labs and imaging studies  CBC: Recent Labs  Lab 02/20/24 1255 02/21/24 2343 02/21/24 2345  WBC 4.9  --  4.8  NEUTROABS  --   --  3.3  HGB 11.7* 11.6* 11.0*  HCT 38.8* 34.0* 34.6*  MCV 85.3  --  84.2  PLT 165  --  170   Basic Metabolic Panel: Recent Labs  Lab 02/20/24 1255 02/21/24 2343 02/21/24 2345  NA 134* 134* 135  K 3.8 3.9 3.8  CL 103 97* 101  CO2 19*  --  22  GLUCOSE 291* 59* 63*  BUN 11 10 12   CREATININE 0.63 0.50* 0.61  CALCIUM  7.5*  --  8.2*  MG  --   --  1.8   GFR: Estimated Creatinine Clearance: 84 mL/min (by C-G formula based on SCr of 0.61 mg/dL). Liver Function Tests: Recent Labs  Lab 02/20/24 1255 02/21/24 2345  AST 16 15  ALT 9 9  ALKPHOS 52 53  BILITOT 1.3* 1.2  PROT 5.0* 5.0*  ALBUMIN 2.4* 2.5*   Recent Labs  Lab 02/20/24 1255 02/21/24 2345  LIPASE 18 18   No results for input(s): AMMONIA in the last 168 hours. Coagulation Profile: No results for input(s): INR, PROTIME in the last 168 hours. Cardiac Enzymes: No results for input(s): CKTOTAL, CKMB, CKMBINDEX, TROPONINI in the last 168 hours. BNP (last 3 results) No results for input(s): PROBNP in the last 8760 hours. HbA1C: No results for input(s): HGBA1C in the last 72 hours. CBG: No results for input(s): GLUCAP in the last 168 hours. Lipid Profile: No results for input(s): CHOL, HDL, LDLCALC, TRIG, CHOLHDL, LDLDIRECT in the last 72 hours. Thyroid  Function Tests: No results for input(s): TSH, T4TOTAL, FREET4, T3FREE, THYROIDAB in the last 72 hours. Anemia Panel: No results for input(s): VITAMINB12, FOLATE, FERRITIN, TIBC, IRON , RETICCTPCT in the last 72  hours. Urine analysis:    Component Value Date/Time   COLORURINE STRAW (A) 10/03/2023 1710   APPEARANCEUR CLEAR 10/03/2023 1710   LABSPEC 1.003 (L) 10/03/2023 1710   PHURINE 6.0 10/03/2023 1710   GLUCOSEU NEGATIVE 10/03/2023 1710   HGBUR NEGATIVE 10/03/2023 1710   BILIRUBINUR NEGATIVE 10/03/2023 1710   KETONESUR NEGATIVE 10/03/2023 1710   PROTEINUR NEGATIVE 01/15/2024 0840   NITRITE NEGATIVE 10/03/2023 1710   LEUKOCYTESUR NEGATIVE 10/03/2023 1710    Radiological Exams on Admission: DG Chest Portable 1 View Result Date: 02/21/2024 CLINICAL DATA:  chest pain Hx Gastric adenocarcinoma , last chemo two weeks ago EXAM: PORTABLE CHEST 1 VIEW COMPARISON:  Chest x-ray 02/02/2024, PET CT 02/03/2024, CT chest 02/03/2024 FINDINGS: Right chest wall Port-A-Cath with tip overlying the  expected region of the superior caval junction. The heart and mediastinal contours are unchanged. Persistent prominent hilar regions consistent with known underlying lymphadenopathy. No focal consolidation. No pulmonary edema. No pleural effusion. No pneumothorax. No acute osseous abnormality. IMPRESSION: 1. No acute cardiopulmonary abnormality. 2. Persistent prominent hilar regions consistent with known underlying lymphadenopathy. Finding better evaluated on PET CT 12/30/2023 and CT angio chest 02/03/2024. Electronically Signed   By: Morgane  Naveau M.D.   On: 02/21/2024 22:32   CT ABDOMEN PELVIS W CONTRAST Result Date: 02/20/2024 CLINICAL DATA:  Concern for bowel obstruction. History of adenocarcinoma of the GE junction. EXAM: CT ABDOMEN AND PELVIS WITH CONTRAST TECHNIQUE: Multidetector CT imaging of the abdomen and pelvis was performed using the standard protocol following bolus administration of intravenous contrast. RADIATION DOSE REDUCTION: This exam was performed according to the departmental dose-optimization program which includes automated exposure control, adjustment of the mA and/or kV according to patient size  and/or use of iterative reconstruction technique. CONTRAST:  OMNIPAQUE  IOHEXOL  300 MG/ML  SOLN COMPARISON:  CT abdomen pelvis dated 02/03/2024. FINDINGS: Lower chest: Small bilateral pleural effusions. There is partial compressive atelectasis of the left lung base. No intra-abdominal free air. There is diffuse mesenteric edema and small ascites. Hepatobiliary: Small liver cysts. No biliary ductal dilatation. The gallbladder is unremarkable. Pancreas: The pancreas is atrophic for patient's age which may be sequela of chronic pancreatitis. Correlation with pancreatic enzymes recommended to exclude the possibility of acute on chronic pancreatitis. No dilatation of the main pancreatic duct. Spleen: Normal in size without focal abnormality. Adrenals/Urinary Tract: The adrenal glands unremarkable. There is no hydronephrosis on either side. The urinary bladder is grossly unremarkable. Stomach/Bowel: There is diffuse thickening of the gastric wall which may be related to ascites or represent gastritis. There is no bowel obstruction. The appendix is normal. Vascular/Lymphatic: The abdominal aorta and IVC unremarkable. No portal venous gas. Retroperitoneal adenopathy measure up to 16 mm in short axis to the left of the aorta at the level of the renal vasculature concerning for metastatic adenopathy. The left para-aortic adenopathy demonstrate central low attenuation which may represent necrotic changes. These are relatively similar in size to the prior CT. Reproductive: The prostate and seminal vesicles are grossly unremarkable. Other: Diffuse subcutaneous edema and anasarca. Musculoskeletal: No acute osseous pathology. IMPRESSION: 1. No bowel obstruction. Normal appendix. 2. Diffuse mesenteric edema, anasarca, and small ascites. 3. Small bilateral pleural effusions. 4. Retroperitoneal adenopathy consistent with metastatic disease. 5. Diffuse thickening of the gastric wall may be related to ascites or represent  gastritis. An infiltrative process is not excluded. 6. Atrophic pancreas for patient's age and possibly secondary to chronic pancreatitis. Correlation with pancreatic enzymes recommended to exclude the possibility of acute on chronic pancreatitis. Electronically Signed   By: Vanetta Chou M.D.   On: 02/20/2024 18:21      Assessment/Plan    Principal Problem:   Abdominal pain Active Problems:   Adenocarcinoma of gastroesophageal junction (HCC)   Intractable nausea and vomiting   Hypoglycemia   Prolonged QT interval   Mild intermittent asthma   Anemia of chronic disease      #) Intractable epigastric pain: In the setting of the patient's metastatic gastric adenocarcinoma with CT abdomen/pelvis with contrast performed on 02/20/2024 demonstrating evidence of diffuse mesenteric edema in the absence of evidence of gastric or bowel perforation nor any evidence of obstruction, the patient reports persistent epigastric discomfort, with poor pain control at home on existing p.o. prn Dilaudid , in part due to  complication posed by inability to tolerate p.o. over the last few days in the setting of intractable nausea/vomiting.  No evidence of acute peritoneal findings on physical exam.  Liver enzymes and lipase are within normal limits.  He is afebrile, appears hemodynamically stable, and appears nonseptic.   Plan: Prn IV Dilaudid , incentive spirometry.  Protonix  40 mg IV twice daily.  Repeat CMP, CBC in the morning.                  #) Intractable nausea/vomiting: Recurrent nausea resulting in at least 3-4 daily episodes of nonbloody, nonbilious emesis over the last 4 to 5 days, causing significant decline in oral intake of food and water  over that timeframe, and significantly limiting the patient's ability to tolerate p.o. medications at home, including limiting his ability to tolerate his p.o. prn analgesic medications.  His intractable nausea/vomiting appears to be in the context  of his metastatic gastric adenocarcinoma, for which she undergoes chemotherapy, as above.  As noted above, CT abdomen/pelvis performed on 02/20/2024 showed no evidence of obstruction or any evidence of bowel/gastric perforation.  Selection of antibiotic options is complicated by the presence of QTc prolongation identified on this evening's EKG.  Plan: Prn IV Compazine , prn IV Valium  for refractory nausea/vomiting .  Continuous lactated Ringer 's at 125 cc/h x 12 hours.  Repeat CMP, magnesium  level in the morning.  Further evaluation management of presenting QTc prolongation, as below.                  #) Hypoglycemia: Present blood sugar mildly low at 63, with likely contribution from significant decline in oral intake over the last few days as a consequence of his intractable nausea/vomiting resulting in significant reduction in oral intake over that timeframe.  No known history of diabetes, nor is he on any insulin or oral hypoglycemic agents as an outpatient.  Will pursue serial CBG monitoring with prn amps of D50 for now.  Appears asymptomatic as it relates to his mild hypoglycemia.  No evidence of underlying contributory infectious process at this time.  Plan: Every 4 hours CBG monitoring with every hour prn amp of D50 for CBG less than 70.  Further evaluation management of presenting intractable nausea/vomiting, as above.                     #) QTc prolongation: Presenting EKG demonstrates QTc of  542  ms, which is notable in the context of his presenting intractable nausea/vomiting. outpatient medications that may be contributing to QTc prolongation: Cymbalta , methadone .  Presenting magnesium  level 1.8, will potassium is 3.8.  Plan: Monitor on telemetry.  Potassium chloride  40 mill equivalents IV over 4 hours.  Repeat EKG in the morning to monitor interval degree of QTc prolongation.  Hold outpatient Cymbalta  and methadone  for now.  Repeat magnesium  level in the  morning.  Repeat CMP in the morning.SABRA                   #) Metastatic gastric adenocarcinoma: Documented history of such, for which he is undergoing chemotherapy, with most recent chemotherapy occurring approximately 2 weeks ago. Has right-sided Port-A-Cath in place. Follows with Dr. Chinita Patten as his outpatient oncologist.  He is noted to be DNR/DNI, but is not comfort care at this time.  Plan: I have added his outpatient oncologist, Dr. Avinash Pasam, to the treatment team starting at 7 AM 02/22/2024.  Further evaluation and management of intractable epigastric pain as well as  intractable nausea/vomiting, as above.                         #) Mild intermittent asthma: documented history thereof, without clinical e/o to suggest current exacerbation. Outpatient respiratory regimen includes as needed albuterol  inhaler.   Plan: Check serum magnesium  level.  As needed albuterol  nebulizer.                    #) Anemia of chronic disease: Documented history of such, a/w with baseline hgb range 9-12, with presenting hgb consistent with this range, in the absence of any overt evidence of active bleed, including no overt evidence of hematemesis in spite of the patient's recent intractable nausea/vomiting superimposed on his metastatic gastric adenocarcinoma.  Plan: Repeat CBC in the morning.  Check INR.      DVT prophylaxis: SCD's   Code Status: DNR/DNI , consistent with active DNR paperwork and MOST form on file Family Communication: none Disposition Plan: Per Rounding Team Consults called:  I have added his outpatient oncologist, Dr. Avinash Pasam, to the treatment team starting at 7 AM 02/22/2024.;  Admission status: Observation    I SPENT GREATER THAN 75  MINUTES IN CLINICAL CARE TIME/MEDICAL DECISION-MAKING IN COMPLETING THIS ADMISSION.     Shaira Sova B Timithy Arons DO [facility] From 7PM - 7AM   02/22/2024, 2:09 AM

## 2024-02-22 NOTE — ED Notes (Addendum)
 Called floor and nurse will call back to ER.

## 2024-02-22 NOTE — ED Notes (Signed)
 ED TO INPATIENT HANDOFF REPORT  Name/Age/Gender Jesus Haynes 48 y.o. male  Code Status    Code Status Orders  (From admission, onward)           Start     Ordered   02/22/24 0203  Do not attempt resuscitation (DNR)- Limited -Do Not Intubate (DNI)  Continuous       Question Answer Comment  If pulseless and not breathing No CPR or chest compressions.   In Pre-Arrest Conditions (Patient Is Breathing and Has A Pulse) Do not intubate. Provide all appropriate non-invasive medical interventions. Avoid ICU transfer unless indicated or required.   Consent: Discussion documented in EHR or advanced directives reviewed      02/22/24 0202           Code Status History     Date Active Date Inactive Code Status Order ID Comments User Context   02/04/2024 2248 02/06/2024 2034 Limited: Do not attempt resuscitation (DNR) -DNR-LIMITED -Do Not Intubate/DNI  [id number]  Silvester Ales, MD ED   07/30/2023 1205 08/02/2023 1645 Full Code [id number]  Celinda Alm Lot, MD ED   06/14/2023 1621 07/11/2023 2225 Full Code [id number]  Celinda Alm Lot, MD Inpatient   06/13/2023 1253 06/14/2023 0509 Full Code [id number]  Philip Cornet, MD HOV   05/27/2023 1516 05/30/2023 1651 Full Code [id number]  Zella Katha HERO, MD ED       Home/SNF/Other Home  Chief Complaint Abdominal pain [R10.9]  Level of Care/Admitting Diagnosis ED Disposition     ED Disposition  Admit   Condition  --   Comment  Hospital Area: [facility] [[id number]]  Level of Care: Telemetry [5]  Admit to tele based on following criteria: Monitor for Ischemic changes  May place patient in observation at [facility] or [facility] if equivalent level of care is available:: No  Covid Evaluation: Asymptomatic - no recent exposure (last 10 days) testing not required  Diagnosis: Abdominal pain [255246]  Admitting Physician: HOWERTER, JUSTIN B [[id number]]  Attending Physician: HOWERTER, JUSTIN B [[id number]]  For patients  discharging to extended facilities (i.e. SNF, AL, group homes or LTAC) initiate:: Discharge to SNF/Facility Placement COVID-19 Lab Testing Protocol          Medical History Past Medical History:  Diagnosis Date   Asthma    Gastric adenocarcinoma (HCC)     Allergies No Known Allergies  IV Location/Drains/Wounds Patient Lines/Drains/Airways Status     Active Line/Drains/Airways     Name Placement date Placement time Site Days   Implanted Port 06/13/23 Right Chest Single Power 06/13/23  1222  Chest  254            Labs/Imaging Results for orders placed or performed during the hospital encounter of 02/21/24 (from the past 48 hours)  I-stat chem 8, ED (not at [facility], [facility] or [facility])     Status: Abnormal   Collection Time: 02/21/24 11:43 PM  Result Value Ref Range   Sodium 134 (L) 135 - 145 mmol/L   Potassium 3.9 3.5 - 5.1 mmol/L   Chloride 97 (L) 98 - 111 mmol/L   BUN 10 6 - 20 mg/dL   Creatinine, Ser 9.49 (L) 0.61 - 1.24 mg/dL   Glucose, Bld 59 (L) 70 - 99 mg/dL    Comment: Glucose reference range applies only to samples taken after fasting for at least 8 hours.   Calcium , Ion 1.19 1.15 - 1.40 mmol/L   TCO2 20 (L) 22 - 32 mmol/L   Hemoglobin  11.6 (L) 13.0 - 17.0 g/dL   HCT 65.9 (L) 60.9 - 47.9 %  CBC with Differential     Status: Abnormal   Collection Time: 02/21/24 11:45 PM  Result Value Ref Range   WBC 4.8 4.0 - 10.5 K/uL   RBC 4.11 (L) 4.22 - 5.81 MIL/uL   Hemoglobin 11.0 (L) 13.0 - 17.0 g/dL   HCT 65.3 (L) 60.9 - 47.9 %   MCV 84.2 80.0 - 100.0 fL   MCH 26.8 26.0 - 34.0 pg   MCHC 31.8 30.0 - 36.0 g/dL   RDW 83.9 (H) 88.4 - 84.4 %   Platelets 170 150 - 400 K/uL   nRBC 0.0 0.0 - 0.2 %   Neutrophils Relative % 69 %   Neutro Abs 3.3 1.7 - 7.7 K/uL   Lymphocytes Relative 18 %   Lymphs Abs 0.8 0.7 - 4.0 K/uL   Monocytes Relative 13 %   Monocytes Absolute 0.6 0.1 - 1.0 K/uL   Eosinophils Relative 0 %   Eosinophils Absolute 0.0 0.0 - 0.5 K/uL   Basophils Relative  0 %   Basophils Absolute 0.0 0.0 - 0.1 K/uL   Immature Granulocytes 0 %   Abs Immature Granulocytes 0.02 0.00 - 0.07 K/uL    Comment: Performed at [facility], [address] [place], [place] [address]  Comprehensive metabolic panel     Status: Abnormal   Collection Time: 02/21/24 11:45 PM  Result Value Ref Range   Sodium 135 135 - 145 mmol/L   Potassium 3.8 3.5 - 5.1 mmol/L   Chloride 101 98 - 111 mmol/L   CO2 22 22 - 32 mmol/L   Glucose, Bld 63 (L) 70 - 99 mg/dL    Comment: Glucose reference range applies only to samples taken after fasting for at least 8 hours.   BUN 12 6 - 20 mg/dL   Creatinine, Ser 9.38 0.61 - 1.24 mg/dL   Calcium  8.2 (L) 8.9 - 10.3 mg/dL   Total Protein 5.0 (L) 6.5 - 8.1 g/dL   Albumin 2.5 (L) 3.5 - 5.0 g/dL   AST 15 15 - 41 U/L   ALT 9 0 - 44 U/L   Alkaline Phosphatase 53 38 - 126 U/L   Total Bilirubin 1.2 0.0 - 1.2 mg/dL   GFR, Estimated >39 >39 mL/min    Comment: (NOTE) Calculated using the CKD-EPI Creatinine Equation (2021)    Anion gap 12 5 - 15    Comment: Performed at [facility], [address], [place], [place] [address]  Magnesium      Status: None   Collection Time: 02/21/24 11:45 PM  Result Value Ref Range   Magnesium  1.8 1.7 - 2.4 mg/dL    Comment: Performed at [facility], [address] [place], [place] [address]  Lipase, blood     Status: None   Collection Time: 02/21/24 11:45 PM  Result Value Ref Range   Lipase 18 11 - 51 U/L    Comment: Performed at [facility], [address] [place], [place] [address]  Troponin I (High Sensitivity)     Status: None   Collection Time: 02/21/24 11:45 PM  Result Value Ref Range   Troponin I (High Sensitivity) 4 <18 ng/L    Comment: (NOTE) Elevated high sensitivity troponin I (hsTnI) values and significant  changes across serial measurements may suggest ACS but many other  chronic and acute conditions are known to  elevate hsTnI results.  Refer  to the Links section for chest pain algorithms and additional  guidance. Performed at [facility], [address], [place], [place] [address]    DG Chest Portable 1 View Result Date: 02/21/2024 CLINICAL DATA:  chest pain Hx Gastric adenocarcinoma , last chemo two weeks ago EXAM: PORTABLE CHEST 1 VIEW COMPARISON:  Chest x-ray 02/02/2024, PET CT 02/03/2024, CT chest 02/03/2024 FINDINGS: Right chest wall Port-A-Cath with tip overlying the expected region of the superior caval junction. The heart and mediastinal contours are unchanged. Persistent prominent hilar regions consistent with known underlying lymphadenopathy. No focal consolidation. No pulmonary edema. No pleural effusion. No pneumothorax. No acute osseous abnormality. IMPRESSION: 1. No acute cardiopulmonary abnormality. 2. Persistent prominent hilar regions consistent with known underlying lymphadenopathy. Finding better evaluated on PET CT 12/30/2023 and CT angio chest 02/03/2024. Electronically Signed   By: Morgane  Naveau M.D.   On: 02/21/2024 22:32   CT ABDOMEN PELVIS W CONTRAST Result Date: 02/20/2024 CLINICAL DATA:  Concern for bowel obstruction. History of adenocarcinoma of the GE junction. EXAM: CT ABDOMEN AND PELVIS WITH CONTRAST TECHNIQUE: Multidetector CT imaging of the abdomen and pelvis was performed using the standard protocol following bolus administration of intravenous contrast. RADIATION DOSE REDUCTION: This exam was performed according to the departmental dose-optimization program which includes automated exposure control, adjustment of the mA and/or kV according to patient size and/or use of iterative reconstruction technique. CONTRAST:  OMNIPAQUE  IOHEXOL  300 MG/ML  SOLN COMPARISON:  CT abdomen pelvis dated 02/03/2024. FINDINGS: Lower chest: Small bilateral pleural effusions. There is partial compressive atelectasis of the left lung base. No intra-abdominal free air.  There is diffuse mesenteric edema and small ascites. Hepatobiliary: Small liver cysts. No biliary ductal dilatation. The gallbladder is unremarkable. Pancreas: The pancreas is atrophic for patient's age which may be sequela of chronic pancreatitis. Correlation with pancreatic enzymes recommended to exclude the possibility of acute on chronic pancreatitis. No dilatation of the main pancreatic duct. Spleen: Normal in size without focal abnormality. Adrenals/Urinary Tract: The adrenal glands unremarkable. There is no hydronephrosis on either side. The urinary bladder is grossly unremarkable. Stomach/Bowel: There is diffuse thickening of the gastric wall which may be related to ascites or represent gastritis. There is no bowel obstruction. The appendix is normal. Vascular/Lymphatic: The abdominal aorta and IVC unremarkable. No portal venous gas. Retroperitoneal adenopathy measure up to 16 mm in short axis to the left of the aorta at the level of the renal vasculature concerning for metastatic adenopathy. The left para-aortic adenopathy demonstrate central low attenuation which may represent necrotic changes. These are relatively similar in size to the prior CT. Reproductive: The prostate and seminal vesicles are grossly unremarkable. Other: Diffuse subcutaneous edema and anasarca. Musculoskeletal: No acute osseous pathology. IMPRESSION: 1. No bowel obstruction. Normal appendix. 2. Diffuse mesenteric edema, anasarca, and small ascites. 3. Small bilateral pleural effusions. 4. Retroperitoneal adenopathy consistent with metastatic disease. 5. Diffuse thickening of the gastric wall may be related to ascites or represent gastritis. An infiltrative process is not excluded. 6. Atrophic pancreas for patient's age and possibly secondary to chronic pancreatitis. Correlation with pancreatic enzymes recommended to exclude the possibility of acute on chronic pancreatitis. Electronically Signed   By: Vanetta Chou M.D.   On:  02/20/2024 18:21    Pending Labs Unresulted Labs (From admission, onward)     Start     Ordered   02/22/24 0500  CBC with Differential/Platelet  Tomorrow morning,   R        02/22/24  [id number]   02/22/24 0500  Comprehensive metabolic panel with GFR  Tomorrow morning,   R        02/22/24 0204   02/22/24 0500  Magnesium   Tomorrow morning,   R        02/22/24 0204   02/22/24 0500  Protime-INR  Tomorrow morning,   R        02/22/24 0237            Vitals/Pain Today's Vitals   02/21/24 2334 02/22/24 0104 02/22/24 0130 02/22/24 0446  BP:  134/86    Pulse:  (!) 57    Resp:  12    Temp:  98.4 F (36.9 C)  98.8 F (37.1 C)  SpO2:  98%    PainSc: 8   7      Isolation Precautions No active isolations  Medications Medications  acetaminophen  (TYLENOL ) tablet 650 mg (has no administration in time range)    Or  acetaminophen  (TYLENOL ) suppository 650 mg (has no administration in time range)  melatonin tablet 3 mg (has no administration in time range)  naloxone  (NARCAN ) injection 0.4 mg (has no administration in time range)  lactated ringers  infusion (0 mLs Intravenous Stopped 02/22/24 0443)  HYDROmorphone  (DILAUDID ) injection 1 mg (has no administration in time range)  dextrose  50 % solution 50 mL (has no administration in time range)  prochlorperazine  (COMPAZINE ) injection 10 mg (has no administration in time range)  diazepam  (VALIUM ) injection 5 mg (has no administration in time range)  potassium chloride  10 mEq in 100 mL IVPB (10 mEq Intravenous New Bag/Given 02/22/24 0552)  albuterol  (PROVENTIL ) (2.5 MG/3ML) 0.083% nebulizer solution 2.5 mg (has no administration in time range)  pantoprazole  (PROTONIX ) injection 40 mg (has no administration in time range)  diazepam  (VALIUM ) injection 2.5 mg (has no administration in time range)  sodium chloride  0.9 % bolus 1,000 mL (0 mLs Intravenous Stopped 02/22/24 0208)  morphine  (PF) 4 MG/ML injection 4 mg (4 mg Intravenous Given 02/21/24 2334)   ondansetron  (ZOFRAN ) injection 4 mg (4 mg Intravenous Given 02/21/24 2332)  pantoprazole  (PROTONIX ) injection 40 mg (40 mg Intravenous Given 02/22/24 0020)  HYDROmorphone  (DILAUDID ) injection 0.5 mg (0.5 mg Intravenous Given 02/22/24 0130)    Mobility walks with person assist

## 2024-02-22 NOTE — Plan of Care (Signed)
  Problem: Education: Goal: Knowledge of General Education information will improve Description: Including pain rating scale, medication(s)/side effects and non-pharmacologic comfort measures Outcome: Progressing   Problem: Clinical Measurements: Goal: Ability to maintain clinical measurements within normal limits will improve Outcome: Progressing   Problem: Coping: Goal: Level of anxiety will decrease Outcome: Progressing   Problem: Nutrition: Goal: Adequate nutrition will be maintained Outcome: Not Progressing

## 2024-02-23 ENCOUNTER — Other Ambulatory Visit: Payer: Self-pay

## 2024-02-23 DIAGNOSIS — R1013 Epigastric pain: Secondary | ICD-10-CM | POA: Diagnosis not present

## 2024-02-23 LAB — GLUCOSE, CAPILLARY
Glucose-Capillary: 127 mg/dL — ABNORMAL HIGH (ref 70–99)
Glucose-Capillary: 106 mg/dL — ABNORMAL HIGH (ref 70–99)
Glucose-Capillary: 113 mg/dL — ABNORMAL HIGH (ref 70–99)
Glucose-Capillary: 110 mg/dL — ABNORMAL HIGH (ref 70–99)
Glucose-Capillary: 96 mg/dL (ref 70–99)
Glucose-Capillary: 146 mg/dL — ABNORMAL HIGH (ref 70–99)

## 2024-02-23 LAB — COMPREHENSIVE METABOLIC PANEL WITH GFR
ALT: 9 U/L (ref 0–44)
AST: 15 U/L (ref 15–41)
Albumin: 2.4 g/dL — ABNORMAL LOW (ref 3.5–5.0)
Alkaline Phosphatase: 48 U/L (ref 38–126)
Anion gap: 8 (ref 5–15)
BUN: <5 mg/dL — ABNORMAL LOW (ref 6–20)
CO2: 27 mmol/L (ref 22–32)
Calcium: 7.6 mg/dL — ABNORMAL LOW (ref 8.9–10.3)
Chloride: 99 mmol/L (ref 98–111)
Creatinine, Ser: 0.47 mg/dL — ABNORMAL LOW (ref 0.61–1.24)
GFR, Estimated: >60 mL/min (ref >60)
Glucose, Bld: 233 mg/dL — ABNORMAL HIGH (ref 70–99)
Potassium: 3.4 mmol/L — ABNORMAL LOW (ref 3.5–5.1)
Sodium: 134 mmol/L — ABNORMAL LOW (ref 135–145)
Total Bilirubin: 0.4 mg/dL (ref 0.0–1.2)
Total Protein: 4.7 g/dL — ABNORMAL LOW (ref 6.5–8.1)

## 2024-02-23 LAB — CBC
HCT: 35.9 % — ABNORMAL LOW (ref 39.0–52.0)
Hemoglobin: 11 g/dL — ABNORMAL LOW (ref 13.0–17.0)
MCH: 25.6 pg — ABNORMAL LOW (ref 26.0–34.0)
MCHC: 30.6 g/dL (ref 30.0–36.0)
MCV: 83.5 fL (ref 80.0–100.0)
Platelets: 144 K/uL — ABNORMAL LOW (ref 150–400)
RBC: 4.3 MIL/uL (ref 4.22–5.81)
RDW: 15.9 % — ABNORMAL HIGH (ref 11.5–15.5)
WBC: 5.3 K/uL (ref 4.0–10.5)
nRBC: 0 % (ref 0.0–0.2)

## 2024-02-23 LAB — PHOSPHORUS: Phosphorus: 2.5 mg/dL (ref 2.5–4.6)

## 2024-02-23 LAB — MAGNESIUM: Magnesium: 1.9 mg/dL (ref 1.7–2.4)

## 2024-02-23 MED ORDER — CHLORHEXIDINE GLUCONATE CLOTH 2 % EX PADS
6.0000 | MEDICATED_PAD | Freq: Every day | CUTANEOUS | Status: DC
  Administered 2024-02-23: 6 via TOPICAL

## 2024-02-23 MED ORDER — SODIUM CHLORIDE 0.9% FLUSH
10.0000 mL | Freq: Two times a day (BID) | INTRAVENOUS | Status: DC
  Administered 2024-02-23 – 2024-02-24 (×3): 10 mL

## 2024-02-23 MED ORDER — ORAL CARE MOUTH RINSE: 15.0000 mL | OROMUCOSAL | Status: DC | PRN

## 2024-02-23 MED ORDER — SODIUM CHLORIDE 0.9% FLUSH: 10.0000 mL | INTRAVENOUS | Status: DC | PRN

## 2024-02-23 MED ORDER — HYDROMORPHONE HCL 1 MG/ML IJ SOLN
1.0000 mg | INTRAMUSCULAR | Status: DC | PRN
  Administered 2024-02-23 – 2024-02-24 (×5): 1 mg via INTRAVENOUS
  Filled 2024-02-23 (×5): qty 1

## 2024-02-23 NOTE — Plan of Care (Signed)

## 2024-02-23 NOTE — Plan of Care (Signed)

## 2024-02-23 NOTE — Plan of Care (Signed)
  Problem: Education: Goal: Knowledge of General Education information will improve Description: Including pain rating scale, medication(s)/side effects and non-pharmacologic comfort measures 02/23/2024 1733 by Angelica Angeline CROME, RN Outcome: Progressing 02/23/2024 1604 by Angelica Angeline CROME, RN Outcome: Progressing   Problem: Health Behavior/Discharge Planning: Goal: Ability to manage health-related needs will improve 02/23/2024 1733 by Angelica Angeline CROME, RN Outcome: Progressing 02/23/2024 1604 by Angelica Angeline CROME, RN Outcome: Progressing   Problem: Clinical Measurements: Goal: Ability to maintain clinical measurements within normal limits will improve 02/23/2024 1733 by Angelica Angeline CROME, RN Outcome: Progressing 02/23/2024 1604 by Angelica Angeline CROME, RN Outcome: Progressing Goal: Will remain free from infection 02/23/2024 1733 by Angelica Angeline CROME, RN Outcome: Progressing 02/23/2024 1604 by Angelica Angeline CROME, RN Outcome: Progressing Goal: Diagnostic test results will improve 02/23/2024 1733 by Angelica Angeline CROME, RN Outcome: Progressing 02/23/2024 1604 by Angelica Angeline CROME, RN Outcome: Progressing Goal: Respiratory complications will improve 02/23/2024 1733 by Angelica Angeline CROME, RN Outcome: Progressing 02/23/2024 1604 by Angelica Angeline CROME, RN Outcome: Progressing Goal: Cardiovascular complication will be avoided 02/23/2024 1733 by Angelica Angeline CROME, RN Outcome: Progressing 02/23/2024 1604 by Angelica Angeline CROME, RN Outcome: Progressing   Problem: Activity: Goal: Risk for activity intolerance will decrease 02/23/2024 1733 by Angelica Angeline CROME, RN Outcome: Progressing 02/23/2024 1604 by Angelica Angeline CROME, RN Outcome: Progressing   Problem: Nutrition: Goal: Adequate nutrition will be maintained 02/23/2024 1733 by Angelica Angeline CROME, RN Outcome: Progressing 02/23/2024 1604 by Angelica Angeline CROME, RN Outcome: Progressing   Problem: Coping: Goal: Level of anxiety will decrease 02/23/2024 1733 by Angelica Angeline CROME, RN Outcome:  Progressing 02/23/2024 1604 by Angelica Angeline CROME, RN Outcome: Progressing   Problem: Elimination: Goal: Will not experience complications related to bowel motility 02/23/2024 1733 by Angelica Angeline CROME, RN Outcome: Progressing 02/23/2024 1604 by Angelica Angeline CROME, RN Outcome: Progressing Goal: Will not experience complications related to urinary retention 02/23/2024 1733 by Angelica Angeline CROME, RN Outcome: Progressing 02/23/2024 1604 by Angelica Angeline CROME, RN Outcome: Progressing   Problem: Pain Managment: Goal: General experience of comfort will improve and/or be controlled 02/23/2024 1733 by Angelica Angeline CROME, RN Outcome: Progressing 02/23/2024 1604 by Angelica Angeline CROME, RN Outcome: Progressing   Problem: Safety: Goal: Ability to remain free from injury will improve 02/23/2024 1733 by Angelica Angeline CROME, RN Outcome: Progressing 02/23/2024 1604 by Angelica Angeline CROME, RN Outcome: Progressing   Problem: Skin Integrity: Goal: Risk for impaired skin integrity will decrease 02/23/2024 1733 by Angelica Angeline CROME, RN Outcome: Progressing 02/23/2024 1604 by Angelica Angeline CROME, RN Outcome: Progressing

## 2024-02-23 NOTE — Progress Notes (Addendum)
 PROGRESS NOTE  Jesus Haynes FMW:[id number] DOB: 03/19/1976 DOA: 02/21/2024 PCP: [name], NP   LOS: 1 day   Brief narrative:   Jesus Haynes is a 48 y.o. male with medical history significant for metastatic gastric adenocarcinoma undergoing chemotherapy, mild intermittent asthma, anemia of chronic disease with baseline hemoglobin 9-12, presented to hospital with complaints of  sharp, nonradiating epigastric discomfort, with nausea and vomiting.  Patient had a ED visit on 02/20/2024, he underwent CT abdomen/pelvis with contrast, which was reported to show diffuse mesenteric edema, but no evidence to suggest gastric perforation or bowel obstruction patient was advised admission for pain control but had refused and had gone home.  In the ED patient had stable vitals.  Labs were notable for mild hyponatremia with sodium of 135.  CBC within normal range.  EKG showed normal sinus rhythm with QTc prolongation..  Chest x-ray without any infiltrate.  In the ED patient received Dilaudid  morphine  Zofran  Protonix  normal saline and was considered for admission to hospital for further evaluation and treatment for abdominal pain with intractable nausea vomiting.     Assessment/Plan: Principal Problem:   Abdominal pain Active Problems:   Adenocarcinoma of gastroesophageal junction (HCC)   Intractable nausea and vomiting   Hypoglycemia   Prolonged QT interval   Mild intermittent asthma   Anemia of chronic disease   Intractable pain   Intractable epigastric pain:  Improving.  History of metastatic gastric adenocarcinoma.  Recent CT abdomen/pelvis with contrast performed on 02/20/2024 demonstrating evidence of diffuse mesenteric edema.   Will continue with the stool softeners and bowel regimen.  LFTs and lipase normal.  Continue bowel regimen, IV Dilaudid  antiemetics and supportive care.  Continue Protonix  IV twice daily.  On clears at this time. Encourage oral intake.  Will advance to full liquids  today.    Intractable nausea/vomiting:  3-4 episodes for last 4 to 5 days with decreased oral intake and oral tolerance.  Has metastatic gastric carcinoma undergoing chemotherapy treatment.  CT abdomen/pelvis performed on 02/20/2024 showed no evidence of obstruction or any evidence of bowel/gastric perforation.  Continue: Prn IV Compazine , prn IV Valium  for refractory nausea/vomiting .  Continuous lactated Ringer 's for hydration..  Monitor QTc.  Patient feels a little better with nausea and vomiting.  Does not wish to be on a suppository.   Hypoglycemia: On initial presentation.  Likely secondary to nausea vomiting and poor oral intake.  On D5 half-normal saline with potassium.  Encouraged oral intake so that we can decrease off fluids.  Hypokalemia.  Potassium was 3.4 today.  Will replenish through IV.   QTc prolongation: Initial EKG demonstrates QTc of  542  ms, electrolytes were replenished.  At this time her QTc has resolved.   Metastatic gastric adenocarcinoma:  Undergoing chemotherapy.  Followed by oncologist Dr. Avinash Pasam, has plans to follow-up in a week or so     Mild intermittent asthma.:  Continue albuterol .  No acute issues.     Anemia of chronic disease: baseline hgb range 9-12 will continue to monitor.  Goals of care discussion.  Patient is DNR resuscitate.  Patient with intractable pain nausea vomiting with metastatic cancer.  I have been told patient's mother is trying to reach the [facility] to inquire about palliative care services.  Patient wishes to go home when possible.  DVT prophylaxis: SCDs Start: 02/22/24 0203   Disposition: Home likely in 1 to 2 days  Status is: Inpatient  The patient is inpatient due to pending clinical  improvement, IV narcotics, IV fluids, hypoglycemia    Code Status:     Code Status: Limited: Do not attempt resuscitation (DNR) -DNR-LIMITED -Do Not Intubate/DNI   Family Communication:  I tried to reach the patient's mother-in-law on 02/23/2024  but was unable to reach.  Consultants: None  Procedures: None  Anti-infectives:  None  Anti-infectives (From admission, onward)    None       Subjective: Today, patient was seen and examined at bedside.  Patient states that she feels a little better with pain and nausea vomiting today.  Has some dry heaves.  Still on D5 water  due to hypoglycemia.  Wishes to advance more on clears today.  Has not had a bowel movement in 2 days.  Does not wish to be on suppository.  Objective: Vitals:   02/22/24 2043 02/23/24 0508  BP: (!) 137/100 (!) 146/87  Pulse: 71 75  Resp: 19 19  Temp: 97.7 F (36.5 C) 97.9 F (36.6 C)  SpO2: 96% 96%    Intake/Output Summary (Last 24 hours) at 02/23/2024 1032 Last data filed at 02/23/2024 0600 Gross per 24 hour  Intake 1662.5 ml  Output --  Net 1662.5 ml   Filed Weights   02/23/24 0500  Weight: 52.6 kg   Body mass index is 18.72 kg/m.   Physical Exam:  Body mass index is 18.72 kg/m.   General: Thinly built, not in obvious distress HENT:   No scleral pallor or icterus noted. Oral mucosa is moist.  Chest:   Diminished breath sounds bilaterally. No crackles or wheezes.  Chest wall port on the right side. CVS: S1 &S2 heard. No murmur.  Regular rate and rhythm. Abdomen: Soft, epigastric region tenderness on palpation with surgical scar,, nondistended.  Bowel sounds are heard.   Extremities: No cyanosis, clubbing or edema.  Peripheral pulses are palpable. Psych: Alert, awake and oriented, normal mood CNS:  No cranial nerve deficits.  Power equal in all extremities.   Skin: Warm and dry.  No rashes noted.  Data Review: I have personally reviewed the following laboratory data and studies,  CBC: Recent Labs  Lab 02/20/24 1255 02/21/24 2343 02/21/24 2345 02/22/24 0717 02/23/24 0704  WBC 4.9  --  4.8 5.6 5.3  NEUTROABS  --   --  3.3 3.7  --   HGB 11.7* 11.6* 11.0* 11.4* 11.0*  HCT 38.8* 34.0* 34.6* 36.8* 35.9*  MCV 85.3  --  84.2  84.8 83.5  PLT 165  --  170 177 144*   Basic Metabolic Panel: Recent Labs  Lab 02/20/24 1255 02/21/24 2343 02/21/24 2345 02/22/24 0717 02/23/24 0704  NA 134* 134* 135 133* 134*  K 3.8 3.9 3.8 4.2 3.4*  CL 103 97* 101 100 99  CO2 19*  --  22 22 27   GLUCOSE 291* 59* 63* 55* 233*  BUN 11 10 12 12  <5*  CREATININE 0.63 0.50* 0.61 0.54* 0.47*  CALCIUM  7.5*  --  8.2* 8.0* 7.6*  MG  --   --  1.8 1.8 1.9  PHOS  --   --   --   --  2.5   Liver Function Tests: Recent Labs  Lab 02/20/24 1255 02/21/24 2345 02/22/24 0717 02/23/24 0704  AST 16 15 13* 15  ALT 9 9 9 9   ALKPHOS 52 53 52 48  BILITOT 1.3* 1.2 0.8 0.4  PROT 5.0* 5.0* 4.8* 4.7*  ALBUMIN 2.4* 2.5* 2.3* 2.4*   Recent Labs  Lab 02/20/24 1255 02/21/24 2345  LIPASE  18 18   No results for input(s): AMMONIA in the last 168 hours. Cardiac Enzymes: No results for input(s): CKTOTAL, CKMB, CKMBINDEX, TROPONINI in the last 168 hours. BNP (last 3 results) No results for input(s): BNP in the last 8760 hours.  ProBNP (last 3 results) No results for input(s): PROBNP in the last 8760 hours.  CBG: Recent Labs  Lab 02/22/24 1739 02/22/24 2301 02/23/24 0157 02/23/24 0510 02/23/24 0723  GLUCAP 142* 143* 127* 106* 113*   No results found for this or any previous visit (from the past 240 hours).   Studies: DG Chest Portable 1 View Result Date: 02/21/2024 CLINICAL DATA:  chest pain Hx Gastric adenocarcinoma , last chemo two weeks ago EXAM: PORTABLE CHEST 1 VIEW COMPARISON:  Chest x-ray 02/02/2024, PET CT 02/03/2024, CT chest 02/03/2024 FINDINGS: Right chest wall Port-A-Cath with tip overlying the expected region of the superior caval junction. The heart and mediastinal contours are unchanged. Persistent prominent hilar regions consistent with known underlying lymphadenopathy. No focal consolidation. No pulmonary edema. No pleural effusion. No pneumothorax. No acute osseous abnormality. IMPRESSION: 1. No acute  cardiopulmonary abnormality. 2. Persistent prominent hilar regions consistent with known underlying lymphadenopathy. Finding better evaluated on PET CT 12/30/2023 and CT angio chest 02/03/2024. Electronically Signed   By: Morgane  Naveau M.D.   On: 02/21/2024 22:32      Vernal Alstrom, MD  [facility] 02/23/2024  If 7PM-7AM, please contact night-coverage

## 2024-02-23 NOTE — Progress Notes (Signed)
 Jesus Haynes [facility] Liaison Note:   Spoke with both Mother in law as well as patient via phone per their request. Explained both Palliative and Hospice services. They would like to wait to make any decisions at this time. They plan on speaking with Oncology on Thursday during appointment and will have a better idea if Chemo is still an option and will decide at that point which service they plan to move forward with.  Notified TOC via secure chat.  [name], BSN, RN [employer] [phone]

## 2024-02-23 NOTE — TOC Initial Note (Signed)
 Transition of Care [facility]) - Initial/Assessment Note    Patient Details  Name: Jesus Haynes MRN: [id number] Date of Birth: [birth year]  Transition of Care [facility]) CM/SW Contact:    [name], RN Phone Number: 02/23/2024, 2:34 PM  Clinical Narrative:                 Beatris w/ pt in room; pt said he lives at home; he plans to return at d/c; he identified POC mother in law Andrez Range [phone]); she will provide transportation; pt verified insurance/PCP; he denied SDOH risks; pt has walker, and wheelchair; he does not have HH services, or home oxygen ; pt said he is followed by palliative care, and [facility] is to contact his mother in law; he is not sure if contact has been made; [name], Hospital Liaison for [facility] notified; she said she has made 2 unsuccessful attempts to make contact; she will try later; TOC is following.  Expected Discharge Plan: Home/Self Care Barriers to Discharge: Continued Medical Work up   Patient Goals and CMS Choice Patient states their goals for this hospitalization and ongoing recovery are:: home          Expected Discharge Plan and Services   Discharge Planning Services: CM Consult   Living arrangements for the past 2 months: Single Family Home                                      Prior Living Arrangements/Services Living arrangements for the past 2 months: Single Family Home Lives with:: Roommate Patient language and need for interpreter reviewed:: Yes Do you feel safe going back to the place where you live?: Yes      Need for Family Participation in Patient Care: Yes (Comment) Care giver support system in place?: Yes (comment) Current home services: DME (walker, shower chair) Criminal Activity/Legal Involvement Pertinent to Current Situation/Hospitalization: No - Comment as needed  Activities of Daily Living   ADL Screening (condition at time of admission) Independently performs ADLs?: Yes (appropriate for developmental  age) Is the patient deaf or have difficulty hearing?: No Does the patient have difficulty seeing, even when wearing glasses/contacts?: No Does the patient have difficulty concentrating, remembering, or making decisions?: No  Permission Sought/Granted Permission sought to share information with : Case Manager Permission granted to share information with : Yes, Verbal Permission Granted  Share Information with NAME: Case Manager     Permission granted to share info w Relationship: Andrez Range (mother in law) [phone]     Emotional Assessment Appearance:: Appears stated age Attitude/Demeanor/Rapport: Gracious Affect (typically observed): Accepting Orientation: : Oriented to Self, Oriented to Place, Oriented to  Time, Oriented to Situation Alcohol / Substance Use: Not Applicable Psych Involvement: No (comment)  Admission diagnosis:  Abdominal pain [R10.9] Abdominal pain, unspecified abdominal location [R10.9] Intractable pain [R52] Patient Active Problem List   Diagnosis Date Noted   Abdominal pain 02/22/2024   Intractable nausea and vomiting 02/22/2024   Hypoglycemia 02/22/2024   Prolonged QT interval 02/22/2024   Mild intermittent asthma 02/22/2024   Anemia of chronic disease 02/22/2024   Intractable pain 02/22/2024   Pain 02/05/2024   Protein-calorie malnutrition, severe 02/05/2024   Hypokalemia 02/04/2024   Tobacco abuse 02/04/2024   Hypophosphatemia 02/04/2024   Dislodged jejunostomy tube 11/01/2023   Weight loss 11/01/2023   Fatigue 10/11/2023   Leukopenia due to antineoplastic chemotherapy (HCC) 09/18/2023   Encounter  for antineoplastic chemotherapy 08/21/2023   Pedal edema 08/21/2023   Acute on chronic blood loss anemia 07/30/2023   Hyponatremia 07/30/2023   Hypocalcemia 07/30/2023   Port-A-Cath in place 07/25/2023   Feeding Jejunostomy tube in place 06/28/2023   Goals of care, counseling/discussion 06/24/2023   Constipation 06/22/2023   Palliative care  encounter 06/21/2023   High risk medication use 06/21/2023   Medication management 06/21/2023   Malnutrition of moderate degree 06/18/2023   Failure to thrive in adult 06/14/2023   Cancer associated pain 06/14/2023   Pre-syncope 06/14/2023   Asthma 06/14/2023   Adenocarcinoma of gastroesophageal junction (HCC) 05/29/2023   Melena 05/29/2023   Anemia due to chronic blood loss 05/29/2023   Upper GI bleed 05/27/2023   PCP:  [name], NP Pharmacy:   [facility] [address] [place] [address] Phone: [phone] Fax: [phone]     Social Drivers of Health (SDOH) Social History: SDOH Screenings   Food Insecurity: No Food Insecurity (02/23/2024)  Housing: Low Risk  (02/23/2024)  Transportation Needs: No Transportation Needs (02/23/2024)  Utilities: Not At Risk (02/23/2024)  Depression (PHQ2-9): Low Risk  (01/30/2024)  Recent Concern: Depression (PHQ2-9) - Medium Risk (01/22/2024)  Social Connections: Unknown (07/30/2023)  Tobacco Use: Medium Risk (02/22/2024)   SDOH Interventions: Food Insecurity Interventions: Intervention Not Indicated, Inpatient TOC Housing Interventions: Intervention Not Indicated, Inpatient TOC Transportation Interventions: Intervention Not Indicated, Inpatient TOC Utilities Interventions: Intervention Not Indicated, Inpatient TOC   Readmission Risk Interventions    02/23/2024    2:29 PM 07/31/2023    3:41 PM  Readmission Risk Prevention Plan  Transportation Screening Complete Complete  Medication Review (RN Care Manager) Complete Complete  PCP or Specialist appointment within 3-5 days of discharge Complete   HRI or Home Care Consult Complete Complete  SW Recovery Care/Counseling Consult Complete Complete  Palliative Care Screening Not Applicable Not Applicable  Skilled Nursing Facility Not Applicable Not Applicable

## 2024-02-24 ENCOUNTER — Other Ambulatory Visit (HOSPITAL_COMMUNITY): Payer: Self-pay

## 2024-02-24 ENCOUNTER — Encounter: Payer: Self-pay | Admitting: Oncology

## 2024-02-24 DIAGNOSIS — R1013 Epigastric pain: Secondary | ICD-10-CM | POA: Diagnosis not present

## 2024-02-24 LAB — GLUCOSE, CAPILLARY
Glucose-Capillary: 108 mg/dL — ABNORMAL HIGH (ref 70–99)
Glucose-Capillary: 89 mg/dL (ref 70–99)
Glucose-Capillary: 92 mg/dL (ref 70–99)

## 2024-02-24 LAB — BASIC METABOLIC PANEL WITH GFR
Anion gap: 6 (ref 5–15)
BUN: <5 mg/dL — ABNORMAL LOW (ref 6–20)
CO2: 27 mmol/L (ref 22–32)
Calcium: 8.2 mg/dL — ABNORMAL LOW (ref 8.9–10.3)
Chloride: 104 mmol/L (ref 98–111)
Creatinine, Ser: 0.44 mg/dL — ABNORMAL LOW (ref 0.61–1.24)
GFR, Estimated: >60 mL/min (ref >60)
Glucose, Bld: 94 mg/dL (ref 70–99)
Potassium: 3.5 mmol/L (ref 3.5–5.1)
Sodium: 137 mmol/L (ref 135–145)

## 2024-02-24 LAB — CBC
HCT: 36.1 % — ABNORMAL LOW (ref 39.0–52.0)
Hemoglobin: 11.3 g/dL — ABNORMAL LOW (ref 13.0–17.0)
MCH: 26.3 pg (ref 26.0–34.0)
MCHC: 31.3 g/dL (ref 30.0–36.0)
MCV: 84 fL (ref 80.0–100.0)
Platelets: 131 K/uL — ABNORMAL LOW (ref 150–400)
RBC: 4.3 MIL/uL (ref 4.22–5.81)
RDW: 16.2 % — ABNORMAL HIGH (ref 11.5–15.5)
WBC: 4 K/uL (ref 4.0–10.5)
nRBC: 0 % (ref 0.0–0.2)

## 2024-02-24 LAB — MAGNESIUM: Magnesium: 1.9 mg/dL (ref 1.7–2.4)

## 2024-02-24 MED ORDER — ONDANSETRON 4 MG PO TBDP: 4.0000 mg | ORAL_TABLET | Freq: Three times a day (TID) | ORAL | Status: DC | PRN

## 2024-02-24 MED ORDER — PROCHLORPERAZINE MALEATE 10 MG PO TABS
5.0000 mg | ORAL_TABLET | Freq: Four times a day (QID) | ORAL | 0 refills | Status: AC | PRN
  Filled 2024-02-24: qty 30, 15d supply, fill #0

## 2024-02-24 MED ORDER — PROCHLORPERAZINE 25 MG RE SUPP
25.0000 mg | Freq: Two times a day (BID) | RECTAL | 0 refills | Status: AC | PRN
Start: 2024-02-24 — End: ?
  Filled 2024-02-24: qty 12, 6d supply, fill #0

## 2024-02-24 MED ORDER — HEPARIN SOD (PORK) LOCK FLUSH 100 UNIT/ML IV SOLN
500.0000 [IU] | Freq: Once | INTRAVENOUS | Status: AC
  Administered 2024-02-24: 500 [IU] via INTRAVENOUS
  Filled 2024-02-24: qty 5

## 2024-02-24 MED ORDER — DOCUSATE SODIUM 100 MG PO CAPS
100.0000 mg | ORAL_CAPSULE | Freq: Every day | ORAL | 0 refills | Status: AC
  Filled 2024-02-24: qty 30, 30d supply, fill #0

## 2024-02-24 MED ORDER — PANTOPRAZOLE SODIUM 40 MG PO TBEC: 40.0000 mg | DELAYED_RELEASE_TABLET | Freq: Two times a day (BID) | ORAL | Status: DC

## 2024-02-24 NOTE — Discharge Summary (Signed)
 Physician Discharge Summary  Jesus Haynes FMW:[id number] DOB: Nov 28, 1975 DOA: 02/21/2024  PCP: [name], NP  Admit date: 02/21/2024 Discharge date: 02/24/2024  Admitted From: Home  Discharge disposition: Home   Recommendations for Outpatient Follow-Up:   Follow up with your primary care provider in one week.  Check CBC, BMP, magnesium  in the next visit Follow-up with your oncologist as outpatient to discuss a further treatment plan.   Discharge Diagnosis:   Principal Problem:   Abdominal pain Active Problems:   Adenocarcinoma of gastroesophageal junction (HCC)   Intractable nausea and vomiting   Hypoglycemia   Prolonged QT interval   Mild intermittent asthma   Anemia of chronic disease   Intractable pain    Discharge Condition: Improved.  Diet recommendation: Soft as tolerated avoid fatty fried foods  Wound care: None.  Code status: Full.   History of Present Illness:   Jesus Haynes is a 48 y.o. male with medical history significant for metastatic gastric adenocarcinoma undergoing chemotherapy, mild intermittent asthma, anemia of chronic disease with baseline hemoglobin 9-12, presented to hospital with complaints of  sharp, nonradiating epigastric discomfort, with nausea and vomiting.  Patient had a ED visit on 02/20/2024, he underwent CT abdomen/pelvis with contrast, which was reported to show diffuse mesenteric edema, but no evidence to suggest gastric perforation or bowel obstruction patient was advised admission for pain control but had refused and had gone home.  In the ED, patient had stable vitals.  Labs were notable for mild hyponatremia with sodium of 135.  CBC within normal range.  EKG showed normal sinus rhythm with QTc prolongation..  Chest x-ray without any infiltrate.  In the ED, patient received Dilaudid , morphine  Zofran  Protonix  normal saline and was considered for admission to hospital for further evaluation and treatment for abdominal pain  with intractable nausea vomiting.     Hospital Course:   Following conditions were addressed during hospitalization as listed below,  Intractable epigastric pain:  Improving.  History of metastatic gastric adenocarcinoma.  Recent CT abdomen/pelvis with contrast performed on 02/20/2024 demonstrating evidence of diffuse mesenteric edema.    LFTs and lipase normal.  During hospitalization patient received bowel regimen, IV Dilaudid  antiemetics, Protonix  IV twice daily.  Patient gradually had o tolerated some oral diet.  Wishes to go home at this time.     Intractable nausea/vomiting:  Presented with 3-4 episodes for last 4 to 5 days with decreased oral intake and oral tolerance.  Has metastatic gastric carcinoma followed by oncology as outpatient.  CT abdomen/pelvis performed on 02/20/2024 showed no evidence of obstruction or any evidence of bowel/gastric perforation.  Patient will be prescribed Compazine  suppository and orally on discharge which seems to help him some.  Received IV hydration during hospitalization.      Hypoglycemia: On initial presentation.  Required D5 half normal saline.  Blood glucose levels have been better with oral intake.  POC glucose of 108 prior to discharge.  Hypokalemia.  Proved after repletion.  Latest potassium of 3.5.    QTc prolongation: Initial EKG demonstrates QTc of  542  ms, electrolytes were replenished.  At this time her QTc has resolved.    Metastatic gastric adenocarcinoma:  Undergoing chemotherapy.  Followed by oncologist Dr. Avinash Pasam, has plans to follow-up in a week or so.  Wishes to discuss further treatment plan.     Mild intermittent asthma.:  Continue albuterol .  No acute issues.     Anemia of chronic disease: baseline hgb range 9-12 will  continue to monitor.  Latest hemoglobin of 11.3.   Goals of care discussion.  Patient is DNR.  Patient with intractable pain nausea vomiting with metastatic cancer.  I have been told patient's mother is  trying to reach the [facility] to inquire about palliative care services.  I have encouraged the patient and patient's mother to discuss with oncology regarding further care plan.  Disposition.  At this time, patient is stable for disposition home with outpatient PCP, oncology follow-up.  Very high risk of recurrent admission to the hospital.  Medical Consultants:   None.  Procedures:    None Subjective:   Today, patient was seen and examined at bedside.  Feels little better.  Wishes to try some solid food.  Was able to hold morphine  down yesterday.  Discharge Exam:   Vitals:   02/23/24 1932 02/24/24 0439  BP: (!) 136/97 (!) 128/102  Pulse: 85 75  Resp: 18 18  Temp: 98.2 F (36.8 C) 97.9 F (36.6 C)  SpO2: 96% 98%   Vitals:   02/23/24 1600 02/23/24 1700 02/23/24 1932 02/24/24 0439  BP:   (!) 136/97 (!) 128/102  Pulse:   85 75  Resp: 17 12 18 18   Temp:   98.2 F (36.8 C) 97.9 F (36.6 C)  TempSrc:   Oral Oral  SpO2:   96% 98%  Weight:      Height:       Body mass index is 18.72 kg/m.  General: Alert awake, not in obvious distress thinly built HENT: pupils equally reacting to light,  No scleral pallor or icterus noted. Oral mucosa is moist.  Chest:  Clear breath sounds.  Diminished breath sounds bilaterally. No crackles or wheezes.  Right chest wall port in place. CVS: S1 &S2 heard. No murmur.  Regular rate and rhythm. Abdomen: Soft, epigastric tenderness noted, surgical scar present, bowel sounds are heard.   Extremities: No cyanosis, clubbing or edema.  Peripheral pulses are palpable. Psych: Alert, awake and oriented, normal mood CNS:  No cranial nerve deficits.  Moves all extremities. Skin: Warm and dry.  No rashes noted.  The results of significant diagnostics from this hospitalization (including imaging, microbiology, ancillary and laboratory) are listed below for reference.     Diagnostic Studies:   DG Chest Portable 1 View Result Date: 02/21/2024 CLINICAL  DATA:  chest pain Hx Gastric adenocarcinoma , last chemo two weeks ago EXAM: PORTABLE CHEST 1 VIEW COMPARISON:  Chest x-ray 02/02/2024, PET CT 02/03/2024, CT chest 02/03/2024 FINDINGS: Right chest wall Port-A-Cath with tip overlying the expected region of the superior caval junction. The heart and mediastinal contours are unchanged. Persistent prominent hilar regions consistent with known underlying lymphadenopathy. No focal consolidation. No pulmonary edema. No pleural effusion. No pneumothorax. No acute osseous abnormality. IMPRESSION: 1. No acute cardiopulmonary abnormality. 2. Persistent prominent hilar regions consistent with known underlying lymphadenopathy. Finding better evaluated on PET CT 12/30/2023 and CT angio chest 02/03/2024. Electronically Signed   By: Morgane  Naveau M.D.   On: 02/21/2024 22:32     Labs:   Basic Metabolic Panel: Recent Labs  Lab 02/20/24 1255 02/21/24 2343 02/21/24 2345 02/22/24 0717 02/23/24 0704 02/24/24 0542  NA 134* 134* 135 133* 134* 137  K 3.8 3.9 3.8 4.2 3.4* 3.5  CL 103 97* 101 100 99 104  CO2 19*  --  22 22 27 27   GLUCOSE 291* 59* 63* 55* 233* 94  BUN 11 10 12 12  <5* <5*  CREATININE 0.63 0.50* 0.61 0.54* 0.47*  0.44*  CALCIUM  7.5*  --  8.2* 8.0* 7.6* 8.2*  MG  --   --  1.8 1.8 1.9 1.9  PHOS  --   --   --   --  2.5  --    GFR Estimated Creatinine Clearance: 84.9 mL/min (A) (by C-G formula based on SCr of 0.44 mg/dL (L)). Liver Function Tests: Recent Labs  Lab 02/20/24 1255 02/21/24 2345 02/22/24 0717 02/23/24 0704  AST 16 15 13* 15  ALT 9 9 9 9   ALKPHOS 52 53 52 48  BILITOT 1.3* 1.2 0.8 0.4  PROT 5.0* 5.0* 4.8* 4.7*  ALBUMIN 2.4* 2.5* 2.3* 2.4*   Recent Labs  Lab 02/20/24 1255 02/21/24 2345  LIPASE 18 18   No results for input(s): AMMONIA in the last 168 hours. Coagulation profile Recent Labs  Lab 02/22/24 0717  INR 1.1    CBC: Recent Labs  Lab 02/20/24 1255 02/21/24 2343 02/21/24 2345 02/22/24 0717 02/23/24 0704  02/24/24 0542  WBC 4.9  --  4.8 5.6 5.3 4.0  NEUTROABS  --   --  3.3 3.7  --   --   HGB 11.7* 11.6* 11.0* 11.4* 11.0* 11.3*  HCT 38.8* 34.0* 34.6* 36.8* 35.9* 36.1*  MCV 85.3  --  84.2 84.8 83.5 84.0  PLT 165  --  170 177 144* 131*   Cardiac Enzymes: No results for input(s): CKTOTAL, CKMB, CKMBINDEX, TROPONINI in the last 168 hours. BNP: Invalid input(s): POCBNP CBG: Recent Labs  Lab 02/23/24 1632 02/23/24 1934 02/24/24 0008 02/24/24 0441 02/24/24 0753  GLUCAP 96 146* 92 89 108*   D-Dimer No results for input(s): DDIMER in the last 72 hours. Hgb A1c No results for input(s): HGBA1C in the last 72 hours. Lipid Profile No results for input(s): CHOL, HDL, LDLCALC, TRIG, CHOLHDL, LDLDIRECT in the last 72 hours. Thyroid  function studies No results for input(s): TSH, T4TOTAL, T3FREE, THYROIDAB in the last 72 hours.  Invalid input(s): FREET3 Anemia work up No results for input(s): VITAMINB12, FOLATE, FERRITIN, TIBC, IRON , RETICCTPCT in the last 72 hours. Microbiology No results found for this or any previous visit (from the past 240 hours).   Discharge Instructions:   Discharge Instructions     Call MD for:  persistant nausea and vomiting   Complete by: As directed    Call MD for:  severe uncontrolled pain   Complete by: As directed    Discharge instructions   Complete by: As directed    Follow up with your  primary care provider in one week. Follow up with oncology as outpatient. Discuss about further goals of care. Continue protein supplements.   Increase activity slowly   Complete by: As directed       Allergies as of 02/24/2024   No Known Allergies      Medication List     STOP taking these medications    amoxicillin -clavulanate 875-125 MG tablet Commonly known as: AUGMENTIN        TAKE these medications    albuterol  (2.5 MG/3ML) 0.083% nebulizer solution Commonly known as: PROVENTIL  Take 3 mLs (2.5  mg total) by nebulization every 6 (six) hours as needed for wheezing or shortness of breath.   Ventolin  HFA 108 (90 Base) MCG/ACT inhaler Generic drug: albuterol  Inhale 1-2 puffs into the lungs every 4 (four) hours as needed for wheezing or shortness of breath.   B-12 1000 MCG Tabs Take 1 tablet (1,000 mcg total) by mouth daily.   docusate sodium  100 MG capsule Commonly known  as: COLACE Take 1 capsule (100 mg total) by mouth daily.   DULoxetine  20 MG capsule Commonly known as: CYMBALTA  Take 1 capsule (20 mg total) by mouth daily.   folic acid  1 MG tablet Commonly known as: FOLVITE  Take 1 tablet (1 mg total) by mouth daily.   HYDROmorphone  2 MG tablet Commonly known as: Dilaudid  Take 1 tablet (2 mg total) by mouth every 4 (four) hours as needed for severe pain (pain score 7-10).   methadone  5 MG tablet Commonly known as: DOLOPHINE  Take 1 tablet (5 mg total) by mouth daily.   ondansetron  4 MG disintegrating tablet Commonly known as: ZOFRAN -ODT Take 1 tablet (4 mg total) by mouth every 8 (eight) hours as needed for nausea or vomiting.   pantoprazole  40 MG tablet Commonly known as: Protonix  Take 1 tablet (40 mg total) by mouth 2 (two) times daily.   potassium chloride  10 MEQ tablet Commonly known as: KLOR-CON  Take 1 tablet (10 mEq total) by mouth daily for 20 doses.   prochlorperazine  10 MG tablet Commonly known as: COMPAZINE  Take 0.5 tablets (5 mg total) by mouth every 6 (six) hours as needed for refractory nausea / vomiting.   prochlorperazine  25 MG suppository Commonly known as: COMPAZINE  Place 1 suppository (25 mg total) rectally every 12 (twelve) hours as needed for refractory nausea / vomiting.   temazepam  15 MG capsule Commonly known as: RESTORIL  Take 1 capsule (15 mg total) by mouth at bedtime as needed for sleep.   thiamine  100 MG tablet Commonly known as: VITAMIN B1 Take 1 tablet (100 mg total) by mouth daily.        Follow-up Information      [name], NP Follow up.   Specialties: Pulmonary Disease, Endocrinology Contact information: [address] [place] [address] [phone]                  Time coordinating discharge: 39 minutes  Signed:  Lum Stillinger  [facility] 02/24/2024, 1:35 PM

## 2024-02-25 ENCOUNTER — Telehealth: Payer: Self-pay

## 2024-02-25 ENCOUNTER — Encounter: Payer: Self-pay | Admitting: Oncology

## 2024-02-25 ENCOUNTER — Other Ambulatory Visit: Payer: Self-pay | Admitting: Nurse Practitioner

## 2024-02-25 ENCOUNTER — Other Ambulatory Visit (HOSPITAL_COMMUNITY): Payer: Self-pay

## 2024-02-25 MED ORDER — POTASSIUM CHLORIDE ER 10 MEQ PO TBCR
10.0000 meq | EXTENDED_RELEASE_TABLET | Freq: Every day | ORAL | 0 refills | Status: DC
  Filled 2024-03-03 (×3): qty 20, 20d supply, fill #0

## 2024-02-25 MED FILL — Fosaprepitant Dimeglumine For IV Infusion 150 MG (Base Eq): INTRAVENOUS | Qty: 5 | Status: AC

## 2024-02-25 NOTE — Transitions of Care (Post Inpatient/ED Visit) (Signed)
 02/25/2024  Name: Jesus Haynes MRN: [id number] DOB: 22-Mar-1976  Today's TOC FU Call Status:   Patient's Name and Date of Birth confirmed.  Transition Care Management Follow-up Telephone Call Discharge Facility: [facility]) Type of Discharge: Emergency Department How have you been since you were released from the hospital?: Better Any questions or concerns?: No  Items Reviewed: Did you receive and understand the discharge instructions provided?: Yes Medications obtained,verified, and reconciled?: Yes (Medications Reviewed) Any new allergies since your discharge?: No Dietary orders reviewed?: NA Do you have support at home?: Yes People in Home [RPT]: spouse  Medications Reviewed Today: Medications Reviewed Today     Reviewed by Starlene Charlynn BIRCH, CMA (Certified Medical Assistant) on 02/25/24 at 1100  Med List Status: <None>   Medication Order Taking? Sig Documenting Provider Last Dose Status Informant  albuterol  (PROVENTIL ) (2.5 MG/3ML) 0.083% nebulizer solution [id number] Yes Take 3 mLs (2.5 mg total) by nebulization every 6 (six) hours as needed for wheezing or shortness of breath. Pickenpack-Cousar, Fannie SAILOR, NP  Active Self, Pharmacy Records  albuterol  (VENTOLIN  HFA) 108 [phone] Base) MCG/ACT inhaler [id number] Yes Inhale 1-2 puffs into the lungs every 4 (four) hours as needed for wheezing or shortness of breath. Autumn Millman, MD  Active Self, Pharmacy Records  cyanocobalamin  1000 MCG tablet [id number] Yes Take 1 tablet (1,000 mcg total) by mouth daily. Pickenpack-Cousar, Fannie SAILOR, NP  Active Self, Pharmacy Records  docusate sodium  (COLACE) 100 MG capsule [id number] Yes Take 1 capsule (100 mg total) by mouth daily. Pokhrel, Laxman, MD  Active   DULoxetine  (CYMBALTA ) 20 MG capsule [id number] Yes Take 1 capsule (20 mg total) by mouth daily. Lue Elsie BROCKS, MD  Active Self, Pharmacy Records  folic acid  (FOLVITE ) 1 MG tablet [id number] Yes Take 1 tablet (1 mg total) by mouth  daily. Pickenpack-Cousar, Fannie SAILOR, NP  Active Self, Pharmacy Records           Med Note RENNIS, DUROJAHYE' R   Fri Feb 21, 2024 10:30 PM) LF 02/12/2024 - Patient states this RX sounds familiar but is not sure if he is taking     HYDROmorphone  (DILAUDID ) 2 MG tablet [id number] Yes Take 1 tablet (2 mg total) by mouth every 4 (four) hours as needed for severe pain (pain score 7-10). Pickenpack-Cousar, Fannie SAILOR, NP  Active Self, Pharmacy Records  methadone  (DOLOPHINE ) 5 MG tablet [id number] Yes Take 1 tablet (5 mg total) by mouth daily. Pickenpack-Cousar, Fannie SAILOR, NP  Active Self, Pharmacy Records  ondansetron  (ZOFRAN -ODT) 4 MG disintegrating tablet [id number] Yes Take 1 tablet (4 mg total) by mouth every 8 (eight) hours as needed for nausea or vomiting. Pokhrel, Laxman, MD  Active   pantoprazole  (PROTONIX ) 40 MG tablet [id number] Yes Take 1 tablet (40 mg total) by mouth 2 (two) times daily. [name], NP  Active Self, Pharmacy Records           Med Note RENNIS, DUROJAHYE' R   Fri Feb 21, 2024 10:30 PM) LF 02/12/2024 -  Patient states this medication sounds familiar but is unsure  potassium chloride  (KLOR-CON ) 10 MEQ tablet [id number] Yes Take 1 tablet (10 mEq total) by mouth daily for 20 doses. Cottie Donnice PARAS, MD  Active Self, Pharmacy Records  prochlorperazine  (COMPAZINE ) 10 MG tablet [id number] Yes Take 0.5 tablets (5 mg total) by mouth every 6 (six) hours as needed for refractory nausea / vomiting. Pokhrel, Laxman, MD  Active   prochlorperazine  (COMPAZINE ) 25 MG suppository [id number] Yes Place 1  suppository (25 mg total) rectally every 12 (twelve) hours as needed for refractory nausea / vomiting. Pokhrel, Laxman, MD  Active   temazepam  (RESTORIL ) 15 MG capsule [id number]  Take 1 capsule (15 mg total) by mouth at bedtime as needed for sleep.  Patient not taking: Reported on 02/21/2024   [place] Fannie SAILOR, NP  Active Self, Pharmacy Records           Med Note RENNIS, DUROJAHYE'  R   Fri Feb 21, 2024 10:28 PM) Has not filled this RX as of yet  thiamine  (VITAMIN B-1) 100 MG tablet [id number] Yes Take 1 tablet (100 mg total) by mouth daily. Lue Elsie BROCKS, MD  Active Self, Pharmacy Records            Home Care and Equipment/Supplies: Were Home Health Services Ordered?: NA Any new equipment or medical supplies ordered?: NA  Functional Questionnaire: Do you need assistance with bathing/showering or dressing?: No Do you need assistance with meal preparation?: No Do you need assistance with eating?: No Do you have difficulty maintaining continence: No Do you need assistance with getting out of bed/getting out of a chair/moving?: No Do you have difficulty managing or taking your medications?: No  Follow up appointments reviewed: PCP Follow-up appointment confirmed?: Yes Date of PCP follow-up appointment?: 03/04/24 Follow-up Provider: [facility] [place] [facility] Follow-up appointment confirmed?: Yes Date of Specialist follow-up appointment?: 02/26/24 Do you need transportation to your follow-up appointment?: No Do you understand care options if your condition(s) worsen?: Yes-patient verbalized understanding    SIGNATURE Laural Eiland, RMA

## 2024-02-25 NOTE — Transitions of Care (Post Inpatient/ED Visit) (Unsigned)
   02/25/2024  Name: Jesus Haynes MRN: [id number] DOB: [birth year]  Today's TOC FU Call Status: Today's TOC FU Call Status:: Unsuccessful Call (1st Attempt) Unsuccessful Call (1st Attempt) Date: 02/25/24  Attempted to reach the patient regarding the most recent Inpatient/ED visit.  Follow Up Plan: Additional outreach attempts will be made to reach the patient to complete the Transitions of Care (Post Inpatient/ED visit) call.   Signature Julian Lemmings, LPN [facility] Nurse Health Advisor Direct Dial [phone]

## 2024-02-26 ENCOUNTER — Inpatient Hospital Stay (HOSPITAL_BASED_OUTPATIENT_CLINIC_OR_DEPARTMENT_OTHER): Admitting: Oncology

## 2024-02-26 ENCOUNTER — Inpatient Hospital Stay

## 2024-02-26 ENCOUNTER — Other Ambulatory Visit

## 2024-02-26 ENCOUNTER — Ambulatory Visit: Admitting: Oncology

## 2024-02-26 ENCOUNTER — Other Ambulatory Visit: Payer: Self-pay | Admitting: Oncology

## 2024-02-26 ENCOUNTER — Inpatient Hospital Stay (HOSPITAL_BASED_OUTPATIENT_CLINIC_OR_DEPARTMENT_OTHER): Admitting: Nurse Practitioner

## 2024-02-26 ENCOUNTER — Encounter: Payer: Self-pay | Admitting: Oncology

## 2024-02-26 ENCOUNTER — Encounter: Payer: Self-pay | Admitting: Nurse Practitioner

## 2024-02-26 VITALS — BP 114/87 | HR 84 | Temp 97.9°F | Resp 18 | Ht 66.0 in | Wt 114.9 lb

## 2024-02-26 DIAGNOSIS — Z79899 Other long term (current) drug therapy: Secondary | ICD-10-CM | POA: Diagnosis not present

## 2024-02-26 DIAGNOSIS — Z95828 Presence of other vascular implants and grafts: Secondary | ICD-10-CM

## 2024-02-26 DIAGNOSIS — G893 Neoplasm related pain (acute) (chronic): Secondary | ICD-10-CM

## 2024-02-26 DIAGNOSIS — R110 Nausea: Secondary | ICD-10-CM

## 2024-02-26 DIAGNOSIS — C160 Malignant neoplasm of cardia: Secondary | ICD-10-CM

## 2024-02-26 DIAGNOSIS — R739 Hyperglycemia, unspecified: Secondary | ICD-10-CM

## 2024-02-26 DIAGNOSIS — E46 Unspecified protein-calorie malnutrition: Secondary | ICD-10-CM | POA: Diagnosis not present

## 2024-02-26 DIAGNOSIS — R630 Anorexia: Secondary | ICD-10-CM

## 2024-02-26 DIAGNOSIS — R634 Abnormal weight loss: Secondary | ICD-10-CM

## 2024-02-26 DIAGNOSIS — Z515 Encounter for palliative care: Secondary | ICD-10-CM

## 2024-02-26 LAB — CBC WITH DIFFERENTIAL (CANCER CENTER ONLY)
Abs Immature Granulocytes: 0.01 K/uL (ref 0.00–0.07)
Basophils Absolute: 0 K/uL (ref 0.0–0.1)
Basophils Relative: 1 %
Eosinophils Absolute: 0.3 K/uL (ref 0.0–0.5)
Eosinophils Relative: 7 %
HCT: 35.5 % — ABNORMAL LOW (ref 39.0–52.0)
Hemoglobin: 11.5 g/dL — ABNORMAL LOW (ref 13.0–17.0)
Immature Granulocytes: 0 %
Lymphocytes Relative: 17 %
Lymphs Abs: 0.8 K/uL (ref 0.7–4.0)
MCH: 26.7 pg (ref 26.0–34.0)
MCHC: 32.4 g/dL (ref 30.0–36.0)
MCV: 82.6 fL (ref 80.0–100.0)
Monocytes Absolute: 0.6 K/uL (ref 0.1–1.0)
Monocytes Relative: 14 %
Neutro Abs: 2.7 K/uL (ref 1.7–7.7)
Neutrophils Relative %: 61 %
Platelet Count: 143 K/uL — ABNORMAL LOW (ref 150–400)
RBC: 4.3 MIL/uL (ref 4.22–5.81)
RDW: 16.7 % — ABNORMAL HIGH (ref 11.5–15.5)
WBC Count: 4.4 K/uL (ref 4.0–10.5)
nRBC: 0 % (ref 0.0–0.2)

## 2024-02-26 LAB — CMP (CANCER CENTER ONLY)
ALT: 7 U/L (ref 0–44)
AST: 14 U/L — ABNORMAL LOW (ref 15–41)
Albumin: 2.9 g/dL — ABNORMAL LOW (ref 3.5–5.0)
Alkaline Phosphatase: 62 U/L (ref 38–126)
Anion gap: 4 — ABNORMAL LOW (ref 5–15)
BUN: 9 mg/dL (ref 6–20)
CO2: 31 mmol/L (ref 22–32)
Calcium: 8 mg/dL — ABNORMAL LOW (ref 8.9–10.3)
Chloride: 104 mmol/L (ref 98–111)
Creatinine: 0.51 mg/dL — ABNORMAL LOW (ref 0.61–1.24)
GFR, Estimated: >60 mL/min (ref >60)
Glucose, Bld: 162 mg/dL — ABNORMAL HIGH (ref 70–99)
Potassium: 3.4 mmol/L — ABNORMAL LOW (ref 3.5–5.1)
Sodium: 139 mmol/L (ref 135–145)
Total Bilirubin: 0.5 mg/dL (ref 0.0–1.2)
Total Protein: 5.1 g/dL — ABNORMAL LOW (ref 6.5–8.1)

## 2024-02-26 MED ORDER — SODIUM CHLORIDE 0.9% FLUSH
10.0000 mL | Freq: Once | INTRAVENOUS | Status: AC
  Administered 2024-02-26: 10 mL

## 2024-02-26 MED ORDER — SODIUM CHLORIDE 0.9% FLUSH
10.0000 mL | Freq: Once | INTRAVENOUS | Status: AC
  Administered 2024-02-26: 10 mL via INTRAVENOUS

## 2024-02-26 NOTE — Transitions of Care (Post Inpatient/ED Visit) (Unsigned)
   02/26/2024  Name: Jesus Haynes MRN: [id number] DOB: 1976-04-14  Today's TOC FU Call Status: Today's TOC FU Call Status:: Unsuccessful Call (2nd Attempt) Unsuccessful Call (1st Attempt) Date: 02/25/24 Unsuccessful Call (2nd Attempt) Date: 02/26/24  Attempted to reach the patient regarding the most recent Inpatient/ED visit.  Follow Up Plan: Additional outreach attempts will be made to reach the patient to complete the Transitions of Care (Post Inpatient/ED visit) call.   Signature Julian Lemmings, LPN [facility] Nurse Health Advisor Direct Dial [phone]

## 2024-02-26 NOTE — Assessment & Plan Note (Signed)
 -Please review HPI/oncology history for additional details and timeline of events.  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  Discussed treatment options. All treatment options are palliative in nature and not curative intent. Plan made to proceed with palliative systemic treatment using FOLFOX plus nivolumab .   -He started palliative systemic treatments with FOLFOX plus nivolumab  from 07/23/2023.  He was admitted to the hospital on 07/30/2023 after he presented with chest and epigastric pain, episodes of melena. He was found to have hemoglobin of 6.8. He was admitted for blood transfusion and further evaluation.  GI deferred additional workup/EGD since the cause of blood loss seemed obvious from malignancy. He was discharged home on 08/02/2023.   -He resumed systemic treatments with cycle 2 of FOLFOX plus nivolumab  on 08/07/2023 without 5-FU bolus and a 20% dose reduction of oxaliplatin  and tolerated the chemotherapy and immunotherapy reasonably well.  Did not experience major side effects.  Recent lower extremity ultrasound to rule out DVT showed possible inguinal lymphadenopathy.  Hence PET/CT was obtained for further evaluation on 09/10/2023.  It showed significant improvement with decreased size and metabolic some of the gastric mass.  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 Hence plan made to continue current management with dose reduced FOLFOX and nivolumab  for up to 6 cycles followed by maintenance treatments.  He received cycle 6 of FOLFOX plus nivolumab  on 11/06/2023. Following cycle 6, plan was to transition him to maintenance treatments with 5-FU and nivolumab , to be continued every 2 weeks.  However because of fatigue, progressive weight loss, we could only use nivolumab  treatments.  Because of progressive weight loss, we obtained restaging scan sooner.  On 12/30/2023, restaging PET scan showed  evidence of mild disease progression with uptake in size and number of pathologic lymph nodes in the bilateral lung hilum, mediastinum and retroperitoneum.  Subtle peritoneal focus of carcinomatosis was noted.  Groundglass hazy opacity in the lungs in the right lower lobe and left upper lobe with some low-level uptake.  Since patient could not tolerate FOLFOX plus nivolumab  because of side effects, plan made to change regimen to ramucirumab  plus paclitaxel  on days 1, 8, 15 of 28-day cycle. In the future, we could reconsider FOLFOX as needed.  Discussed side effect profile including risk of thrombosis, wound healing issues, bleeding issues, hypertension, neuropathy, nausea, vomiting, fatigue, altered taste, infusion reactions etc.  Patient verbalized understanding and was willing to proceed with this regimen.  Treatment was switched to ramucirumab  plus paclitaxel  starting from 01/15/2024.  Patient has been having difficult time with the current regimen, especially with neuropathy, hair loss, fatigue, decreased appetite and weight loss.  He was recently hospitalized for supportive care after he presented with abdominal pain, decreased oral intake.    Recent CT scan shows no new lymphadenopathy but indicates intestinal swelling and a small amount of ascites. Concern for cancer progression if chemotherapy breaks are prolonged. He prefers to delay chemotherapy to improve nutritional status and weight.  - Evaluate ability to tolerate chemotherapy based on activity level and nutritional status - Discuss potential resumption of chemotherapy in two weeks if condition allows  He would like to take a break for 2 more weeks before reconsidering whether he can continue with current regimen or not.  We will dose reduce chemotherapy and also change intervals to days 1, 15 of 28-day cycle.  - Monitor weight and nutritional intake, encourage protein and calorie intake with supplements like Boost  or Ensure.

## 2024-02-26 NOTE — Progress Notes (Signed)
 [facility]  ONCOLOGY CLINIC PROGRESS NOTE   Patient Care Team: [name], NP as PCP - General (Pulmonary Disease) Autumn Millman, MD as Consulting Physician (Oncology) Rosalie Kitchens, MD as Consulting Physician (Gastroenterology) Pickenpack-Cousar, Fannie SAILOR, NP as Nurse Practitioner [facility] and Palliative Medicine)  PATIENT NAME: Jesus Haynes   MR#: [id number] DOB: [birth year]  Date of visit: 02/26/2024   ASSESSMENT & PLAN:   Jesus Haynes is a 48 y.o. pleasant gentleman with history of asthma, presented to the ED on 05/27/2023 with complaints of epigastric burning abdominal pain, nausea, retching.  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  HER2/neu negative.  MMR intact.  NGS testing showed no actionable mutations.  Adenocarcinoma of gastroesophageal junction (HCC) -Please review HPI/oncology history for additional details and timeline of events.  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  Discussed treatment options. All treatment options are palliative in nature and not curative intent. Plan made to proceed with palliative systemic treatment using FOLFOX plus nivolumab .   -He started palliative systemic treatments with FOLFOX plus nivolumab  from 07/23/2023.  He was admitted to the hospital on 07/30/2023 after he presented with chest and epigastric pain, episodes of melena. He was found to have hemoglobin of 6.8. He was admitted for blood transfusion and further evaluation.  GI deferred additional workup/EGD since the cause of blood loss seemed obvious from malignancy. He was discharged home on 08/02/2023.   -He resumed systemic treatments with cycle 2 of FOLFOX plus nivolumab  on 08/07/2023 without 5-FU bolus and a 20% dose  reduction of oxaliplatin  and tolerated the chemotherapy and immunotherapy reasonably well.  Did not experience major side effects.  Recent lower extremity ultrasound to rule out DVT showed possible inguinal lymphadenopathy.  Hence PET/CT was obtained for further evaluation on 09/10/2023.  It showed significant improvement with decreased size and metabolic some of the gastric mass.  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 Hence plan made to continue current management with dose reduced FOLFOX and nivolumab  for up to 6 cycles followed by maintenance treatments.  He received cycle 6 of FOLFOX plus nivolumab  on 11/06/2023. Following cycle 6, plan was to transition him to maintenance treatments with 5-FU and nivolumab , to be continued every 2 weeks.  However because of fatigue, progressive weight loss, we could only use nivolumab  treatments.  Because of progressive weight loss, we obtained restaging scan sooner.  On 12/30/2023, restaging PET scan showed evidence of mild disease progression with uptake in size and number of pathologic lymph nodes in the bilateral lung hilum, mediastinum and retroperitoneum.  Subtle peritoneal focus of carcinomatosis was noted.  Groundglass hazy opacity in the lungs in the right lower lobe and left upper lobe with some low-level uptake.  Since patient could not tolerate FOLFOX plus nivolumab  because of side effects, plan made to change regimen to ramucirumab  plus paclitaxel  on days 1, 8, 15 of 28-day cycle. In the future, we could reconsider FOLFOX as needed.  Discussed side effect profile including risk of thrombosis, wound healing issues, bleeding issues, hypertension, neuropathy, nausea, vomiting, fatigue, altered taste, infusion reactions etc.  Patient verbalized understanding and was willing to proceed with this regimen.  Treatment was switched to ramucirumab  plus paclitaxel  starting from 01/15/2024.  Patient has been having difficult time with the current  regimen, especially  with neuropathy, hair loss, fatigue, decreased appetite and weight loss.  He was recently hospitalized for supportive care after he presented with abdominal pain, decreased oral intake.    Recent CT scan shows no new lymphadenopathy but indicates intestinal swelling and a small amount of ascites. Concern for cancer progression if chemotherapy breaks are prolonged. He prefers to delay chemotherapy to improve nutritional status and weight.  - Evaluate ability to tolerate chemotherapy based on activity level and nutritional status - Discuss potential resumption of chemotherapy in two weeks if condition allows  He would like to take a break for 2 more weeks before reconsidering whether he can continue with current regimen or not.  We will dose reduce chemotherapy and also change intervals to days 1, 15 of 28-day cycle.  - Monitor weight and nutritional intake, encourage protein and calorie intake with supplements like Boost or Ensure.  Assessment & Plan Chemotherapy-induced nausea and vomiting Nausea and vomiting improved with Compazine . Previously on multiple antiemetics including Zofran  and ondansetron , but Compazine  has been most effective. He prefers not to use suppositories despite understanding their benefits. - Continue Compazine  for nausea management - Provide suppositories for potential future use if nausea becomes severe  Cancer-related pain Pain management in hospital included hydromorphone . Methadone  was not administered during hospitalization. Pain currently manageable without new medications.  Cancer-associated malnutrition and weight loss Weight loss due to difficulty eating and nausea. Recent improvement in eating habits post-hospitalization. Emphasis on increasing protein intake to gain weight and improve nutritional status. He aims to gain weight before resuming chemotherapy. - Encourage high-protein diet including shakes like Boost or Ensure - Aim for weight  gain of five pounds in two weeks  Anasarca due to malignancy and hypoalbuminemia Anasarca present due to low protein levels and third spacing of fluids. Albumin levels slightly improved. Ascites contributing to difficulty in food absorption. - Monitor ascites and albumin levels - Encourage protein intake to improve albumin levels  Blood glucose fluctuations with possible pancreatic dysfunction and chronic pancreatitis Recent episodes of hypoglycemia during hospitalization. Possible chronic pancreatitis suggested by imaging. Concern for pancreatic dysfunction affecting blood glucose levels. - Order tests to evaluate pancreatic function and A1c levels - Prescribe glucometer for home monitoring of blood glucose - Instruct on managing hypoglycemia with quick-acting carbohydrates like orange juice   I reviewed lab results and outside records for this visit and discussed relevant results with the patient. Diagnosis, plan of care and treatment options were also discussed in detail with the patient. Opportunity provided to ask questions and answers provided to his apparent satisfaction. Provided instructions to call our clinic with any problems, questions or concerns prior to return visit. I recommended to continue follow-up with PCP and sub-specialists. He verbalized understanding and agreed with the plan.   NCCN guidelines have been consulted in the planning of this patient's care.  I spent a total of 40 minutes during this encounter with the patient including review of chart and various tests results, discussions about plan of care and coordination of care plan.   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  NGS testing showed no actionable mutations.  Current Treatment: Palliative systemic treatments with  FOLFOX plus nivolumab , started from 07/23/2023.  After completing 6 cycles of FOLFOX + nivolumab , transitioned to maintenance treatments with 5-FU plus nivolumab  from 11/20/2023.  Because of disease progression, treatment switched to ramucirumab  plus paclitaxel  from 01/15/2024.  INTERVAL HISTORY:   Discussed the use of AI scribe software for clinical note transcription with the patient, who gave verbal consent to proceed.  History of Present Illness  TAEDYN GLASSCOCK is a 48 year old male with cancer who presents with issues related to nausea, weight loss, and recent hospitalization.  He was recently hospitalized due to severe pain and dehydration, during which he was unable to eat and experienced persistent vomiting. He could only tolerate small amounts of liquids like Gatorade. Since discharge, he has been able to eat more, including hot dogs, fish sticks, and pizza, although his intake remains limited.  He is currently taking Compazine  for nausea, which has been effective. Previously, he was on a combination of medications including Zofran  and ondansetron , but now he is primarily using Compazine . He has suppositories available for severe nausea, although he is reluctant to use them.  Recent blood work shows stable counts with a hemoglobin of 11.5 and normal platelets. His albumin levels have improved slightly. A recent CT scan showed no new lymphadenopathy but did reveal intestinal swelling.  He has been experiencing fluctuations in blood sugar levels, with recent episodes of hypoglycemia during his hospital stay. This is a new development as his blood sugar has typically been elevated.  He is able to be up and about at home, engaging in light activities such as standing in the kitchen and tinkering in the garage. He is not engaging in strenuous activities  but is mobile around the house. He has been trying to increase his food intake to gain weight, consuming protein-rich foods and drinks.  He is not currently on any new medications for his pain, which was managed with hydromorphone  during his hospital stay. He is not taking methadone  at this time.    I have reviewed the past medical history, past surgical history, social history and family history with the patient and they are unchanged from previous note.  HISTORY OF PRESENT ILLNESS:   Oncology History  Adenocarcinoma of gastroesophageal junction (HCC)  05/29/2023 Initial Diagnosis   Adenocarcinoma of gastroesophageal junction (HCC)   07/18/2023 Cancer Staging   Staging form: Esophagus - Adenocarcinoma, AJCC 8th Edition - Clinical: Stage IVB (cT4, cN3, cM1, G3) - Signed by Autumn Millman, MD on 07/18/2023 Histologic grading system: 3 grade system   07/23/2023 - 01/02/2024 Chemotherapy   Patient is on Treatment Plan : GASTROESOPHAGEAL FOLFOX + Nivolumab  q14d     01/15/2024 -  Chemotherapy   Patient is on Treatment Plan : GASTROESOPHAGEAL Ramucirumab  D1, 15 + Paclitaxel  D1,8,15 q28d       48 y.o. gentleman with history of asthma, presented to the ED on 05/27/2023 with complaints of epigastric burning abdominal pain, nausea, retching.  He also reported some dark tarry stools for 3 days prior to arrival.  He was previously seen in the ED at [facility] on 05/08/2023 with complaints of chest or right upper quadrant abdominal pain.  Workup was unremarkable at that time and he was sent home with Protonix  and Carafate .  Since his symptoms persisted, he presented to the ED again.   In the ED, his hemoglobin was noted to be down to 10, compared to 13 previously.  With concern for upper GI bleed, was admitted for further evaluation and management.  Dr. Magod performed upper GI endoscopy on 05/28/2023.  It showed partially obstructing, likely malignant esophageal tumor in the lower third of the  esophagus.  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  Normal duodenum.  Pathology from GE junction mass came back positive for invasive adenocarcinoma, moderately to poorly differentiated.  MMR intact.  HER2/neu negative.  Guardant 360 NGS testing showed TP53 C176F mutation.  No evidence of MSI-high status.  Low TMB (4.75 mut/Mb).  Overall no actionable mutations.   CT chest abdomen pelvis on 05/28/2023 showed abnormal wall thickening at the GE junction, compatible with patient's known carcinoma.  Prominent lymph nodes in the retrocrural region on the right side, adjacent to the hiatal hernia.  Abnormal low-density soft tissue in the gastrohepatic region, abutting the lesser curvature and proximal stomach, worrisome for metastatic disease.  Periportal, periceliac, gastrosplenic and left retroperitoneal lymphadenopathy, worrisome for metastatic involvement.  Hypodensities in the liver were also noted, worrisome for metastatic disease, largest 1 measuring 13 mm in the inferior right lobe.  Fat stranding surrounding the body and tail of pancreas, suggesting acute pancreatitis.   We were consulted during his hospitalization on 05/29/2023 for additional recommendations given new diagnosis of GE junction adenocarcinoma.   On 06/07/2023, staging PET/CT showed large hypermetabolic mass involving the distal esophagus and proximal stomach consistent with known primary esophageal carcinoma.  Multiple hypermetabolic retroperitoneal, lower mediastinal lymph nodes consistent with locally advanced disease.  Suspected direct tumor extension into the pancreatic body.  Hypermetabolic node or peritoneal implant within the omentum.  No other evidence of metastatic disease.   Plan was for palliative systemic treatments with FOLFOX plus nivolumab .  However patient's performance status continued to decline and he had to be hospitalized again on 06/14/2023.  He finally presented to reestablish care in our clinic on  07/18/2023.  His performance status improved with nutrition via J-tube.  Started systemic treatments from 07/23/2023.  We did dose reduce oxaliplatin  by 20% and proceeded with 68 mg/m dose.  When he presented for cycle 2 on 08/07/2023, leukopenia with white count of 3300, ANC 1100.  We started skipping 5-FU bolus and continue with the dose reduced oxaliplatin .  Restaging PET scan on 09/10/2023 showed excellent response with significant reduction in size and metabolism in the gastric mass.  Resolution of peritoneal metastatic disease.   Completed 6 cycles of FOLFOX plus nivolumab  as of 11/06/2023.  Transitioned to maintenance treatments with 5-FU plus nivolumab  from 11/20/2023.  Because of progressive weight loss, we obtained restaging scan sooner.  On 12/30/2023, restaging PET scan showed evidence of mild disease progression with uptake in size and number of pathologic lymph nodes in the bilateral lung hilum, mediastinum and retroperitoneum.  Subtle peritoneal focus of carcinomatosis was noted.  Groundglass hazy opacity in the lungs in the right lower lobe and left upper lobe with some low-level uptake.  Since patient could not tolerate FOLFOX plus nivolumab  because of side effects, plan made to change regimen to ramucirumab  plus paclitaxel  on days 1, 8, 15 of 28-day cycle.  In the future, we could reconsider FOLFOX as needed.  REVIEW OF SYSTEMS:   Review of Systems - Oncology  All other pertinent systems were reviewed with the patient and are negative.  ALLERGIES: He has no known allergies.  MEDICATIONS:  Current Outpatient Medications  Medication Sig Dispense Refill   albuterol  (PROVENTIL ) (2.5 MG/3ML) 0.083% nebulizer solution Take 3 mLs (2.5 mg total) by nebulization every 6 (six) hours as needed for wheezing or shortness of breath. 75  mL 12   albuterol  (VENTOLIN  HFA) 108 (90 Base) MCG/ACT inhaler Inhale 1-2 puffs into the lungs every 4 (four) hours as needed for wheezing or shortness of  breath. 18 g 3   cyanocobalamin  1000 MCG tablet Take 1 tablet (1,000 mcg total) by mouth daily. 30 tablet 6   docusate sodium  (COLACE) 100 MG capsule Take 1 capsule (100 mg total) by mouth daily. 30 capsule 0   DULoxetine  (CYMBALTA ) 20 MG capsule Take 1 capsule (20 mg total) by mouth daily. 30 capsule 0   folic acid  (FOLVITE ) 1 MG tablet Take 1 tablet (1 mg total) by mouth daily. 30 tablet 6   HYDROmorphone  (DILAUDID ) 2 MG tablet Take 1 tablet (2 mg total) by mouth every 4 (four) hours as needed for severe pain (pain score 7-10). 45 tablet 0   methadone  (DOLOPHINE ) 5 MG tablet Take 1 tablet (5 mg total) by mouth daily. 30 tablet 0   pantoprazole  (PROTONIX ) 40 MG tablet Take 1 tablet (40 mg total) by mouth 2 (two) times daily. 60 tablet 1   potassium chloride  (KLOR-CON ) 10 MEQ tablet Take 1 tablet (10 mEq total) by mouth daily for 20 doses. 20 tablet 0   prochlorperazine  (COMPAZINE ) 10 MG tablet Take 0.5 tablets (5 mg total) by mouth every 6 (six) hours as needed for refractory nausea / vomiting. 30 tablet 0   prochlorperazine  (COMPAZINE ) 25 MG suppository Place 1 suppository (25 mg total) rectally every 12 (twelve) hours as needed for refractory nausea / vomiting. 12 suppository 0   temazepam  (RESTORIL ) 15 MG capsule Take 1 capsule (15 mg total) by mouth at bedtime as needed for sleep. 30 capsule 0   thiamine  (VITAMIN B-1) 100 MG tablet Take 1 tablet (100 mg total) by mouth daily. 30 tablet 0   No current facility-administered medications for this visit.     VITALS:   Blood pressure 114/87, pulse 84, temperature 97.9 F (36.6 C), temperature source Tympanic, resp. rate 18, height 5' 6 (1.676 m), weight 114 lb 14.4 oz (52.1 kg), SpO2 99%.  Wt Readings from Last 3 Encounters:  02/26/24 114 lb 14.4 oz (52.1 kg)  02/23/24 115 lb 15.4 oz (52.6 kg)  02/12/24 114 lb 11.2 oz (52 kg)    Body mass index is 18.55 kg/m.   Onc Performance Status - 02/26/24 1012       ECOG Perf Status   ECOG  Perf Status Capable of only limited selfcare, confined to bed or chair more than 50% of waking hours      KPS SCALE   KPS % SCORE Requires occasional assistance but is able to care for most needs               PHYSICAL EXAM:   Physical Exam Constitutional:      General: He is not in acute distress.    Appearance: Normal appearance.  HENT:     Head: Normocephalic and atraumatic.  Eyes:     General: No scleral icterus.    Conjunctiva/sclera: Conjunctivae normal.  Cardiovascular:     Rate and Rhythm: Normal rate and regular rhythm.     Heart sounds: Normal heart sounds.  Pulmonary:     Effort: Pulmonary effort is normal.     Breath sounds: Normal breath sounds.  Chest:     Comments: Port-A-Cath in place without signs of infection Abdominal:     General: There is no distension.  Lymphadenopathy:     Cervical: No cervical adenopathy.  Neurological:  General: No focal deficit present.     Mental Status: He is oriented to person, place, and time.  Psychiatric:        Mood and Affect: Mood normal.        Behavior: Behavior normal.      LABORATORY DATA:   I have reviewed the data as listed.  Results for orders placed or performed in visit on 02/26/24  CMP (Cancer Center only)  Result Value Ref Range   Sodium 139 135 - 145 mmol/L   Potassium 3.4 (L) 3.5 - 5.1 mmol/L   Chloride 104 98 - 111 mmol/L   CO2 31 22 - 32 mmol/L   Glucose, Bld 162 (H) 70 - 99 mg/dL   BUN 9 6 - 20 mg/dL   Creatinine 9.48 (L) 9.38 - 1.24 mg/dL   Calcium  8.0 (L) 8.9 - 10.3 mg/dL   Total Protein 5.1 (L) 6.5 - 8.1 g/dL   Albumin 2.9 (L) 3.5 - 5.0 g/dL   AST 14 (L) 15 - 41 U/L   ALT 7 0 - 44 U/L   Alkaline Phosphatase 62 38 - 126 U/L   Total Bilirubin 0.5 0.0 - 1.2 mg/dL   GFR, Estimated >39 >39 mL/min   Anion gap 4 (L) 5 - 15  CBC with Differential (Cancer Center Only)  Result Value Ref Range   WBC Count 4.4 4.0 - 10.5 K/uL   RBC 4.30 4.22 - 5.81 MIL/uL   Hemoglobin 11.5 (L) 13.0 -  17.0 g/dL   HCT 64.4 (L) 60.9 - 47.9 %   MCV 82.6 80.0 - 100.0 fL   MCH 26.7 26.0 - 34.0 pg   MCHC 32.4 30.0 - 36.0 g/dL   RDW 83.2 (H) 88.4 - 84.4 %   Platelet Count 143 (L) 150 - 400 K/uL   nRBC 0.0 0.0 - 0.2 %   Neutrophils Relative % 61 %   Neutro Abs 2.7 1.7 - 7.7 K/uL   Lymphocytes Relative 17 %   Lymphs Abs 0.8 0.7 - 4.0 K/uL   Monocytes Relative 14 %   Monocytes Absolute 0.6 0.1 - 1.0 K/uL   Eosinophils Relative 7 %   Eosinophils Absolute 0.3 0.0 - 0.5 K/uL   Basophils Relative 1 %   Basophils Absolute 0.0 0.0 - 0.1 K/uL   Immature Granulocytes 0 %   Abs Immature Granulocytes 0.01 0.00 - 0.07 K/uL     RADIOGRAPHIC STUDIES:  DG Chest Portable 1 View CLINICAL DATA:  chest pain Hx Gastric adenocarcinoma , last chemo two weeks ago  EXAM: PORTABLE CHEST 1 VIEW  COMPARISON:  Chest x-ray 02/02/2024, PET CT 02/03/2024, CT chest 02/03/2024  FINDINGS: Right chest wall Port-A-Cath with tip overlying the expected region of the superior caval junction.  The heart and mediastinal contours are unchanged. Persistent prominent hilar regions consistent with known underlying lymphadenopathy.  No focal consolidation. No pulmonary edema. No pleural effusion. No pneumothorax.  No acute osseous abnormality.  IMPRESSION: 1. No acute cardiopulmonary abnormality. 2. Persistent prominent hilar regions consistent with known underlying lymphadenopathy. Finding better evaluated on PET CT 12/30/2023 and CT angio chest 02/03/2024.  Electronically Signed   By: Morgane  Naveau M.D.   On: 02/21/2024 22:32    CODE STATUS:  Code Status History     Date Active Date Inactive Code Status Order ID Comments User Context   06/14/2023 1621 07/11/2023 2225 Full Code [id number]  Celinda Alm Lot, MD Inpatient   06/13/2023 1253 06/14/2023 0509 Full Code [id number]  Philip Cornet,  MD [facility]   05/27/2023 1516 05/30/2023 1651 Full Code [id number]  Zella Katha HERO, MD ED    Questions for Most  Recent Historical Code Status (Order [id number])     Question Answer   By: Consent: discussion documented in EHR            Orders Placed This Encounter  Procedures   CBC with Differential (Cancer Center Only)    Standing Status:   Future    Expected Date:   03/11/2024    Expiration Date:   06/09/2024   CMP (Cancer Center only)    Standing Status:   Future    Expected Date:   03/11/2024    Expiration Date:   06/09/2024   Magnesium     Standing Status:   Future    Expected Date:   03/11/2024    Expiration Date:   06/09/2024   Lipase, blood    Standing Status:   Future    Expected Date:   03/11/2024    Expiration Date:   06/09/2024   Hemoglobin A1c    Standing Status:   Future    Expected Date:   03/11/2024    Expiration Date:   02/25/2025    Future Appointments  Date Time Provider Department Center  03/04/2024  1:20 PM [name], NP [facility]  03/11/2024 10:45 AM [facility] FLUSH [facility] None  03/11/2024 11:15 AM Argentina Kosch, Chinita, MD [facility] None  03/11/2024 12:30 PM [facility] INFUSION [facility] None  03/11/2024  2:30 PM Ivonne Harlene RAMAN, RD [facility] None     This document was completed utilizing speech recognition software. Grammatical errors, random word insertions, pronoun errors, and incomplete sentences are an occasional consequence of this system due to software limitations, ambient noise, and hardware issues. Any formal questions or concerns about the content, text or information contained within the body of this dictation should be directly addressed to the provider for clarification.

## 2024-02-27 ENCOUNTER — Ambulatory Visit

## 2024-02-27 NOTE — Transitions of Care (Post Inpatient/ED Visit) (Signed)
   02/27/2024  Name: Jesus Haynes MRN: [id number] DOB: 09-03-1975  Today's TOC FU Call Status: Today's TOC FU Call Status:: Unsuccessful Call (3rd Attempt) Unsuccessful Call (1st Attempt) Date: 02/25/24 Unsuccessful Call (2nd Attempt) Date: 02/26/24 Unsuccessful Call (3rd Attempt) Date: 02/27/24  Attempted to reach the patient regarding the most recent Inpatient/ED visit.  Follow Up Plan: No further outreach attempts will be made at this time. We have been unable to contact the patient.  Signature  Julian Lemmings, LPN [facility] Nurse Health Advisor Direct Dial [phone]

## 2024-03-03 ENCOUNTER — Other Ambulatory Visit: Payer: Self-pay

## 2024-03-03 ENCOUNTER — Other Ambulatory Visit: Payer: Self-pay | Admitting: Nurse Practitioner

## 2024-03-03 ENCOUNTER — Encounter: Payer: Self-pay | Admitting: Oncology

## 2024-03-03 ENCOUNTER — Other Ambulatory Visit (HOSPITAL_COMMUNITY): Payer: Self-pay

## 2024-03-03 DIAGNOSIS — G893 Neoplasm related pain (acute) (chronic): Secondary | ICD-10-CM

## 2024-03-03 DIAGNOSIS — R110 Nausea: Secondary | ICD-10-CM

## 2024-03-03 DIAGNOSIS — R630 Anorexia: Secondary | ICD-10-CM

## 2024-03-03 DIAGNOSIS — Z515 Encounter for palliative care: Secondary | ICD-10-CM

## 2024-03-03 DIAGNOSIS — C160 Malignant neoplasm of cardia: Secondary | ICD-10-CM

## 2024-03-03 MED ORDER — DULOXETINE HCL 20 MG PO CPEP
20.0000 mg | ORAL_CAPSULE | Freq: Every day | ORAL | 3 refills | Status: AC
  Filled 2024-03-03 (×2): qty 30, 30d supply, fill #0

## 2024-03-03 MED ORDER — DEXAMETHASONE 2 MG PO TABS
ORAL_TABLET | ORAL | 0 refills | Status: AC
  Filled 2024-03-03 (×2): qty 20, 8d supply, fill #0

## 2024-03-03 MED ORDER — SCOPOLAMINE 1 MG/3DAYS TD PT72
1.0000 | MEDICATED_PATCH | TRANSDERMAL | 3 refills | Status: AC
  Filled 2024-03-03 (×2): qty 10, 30d supply, fill #0

## 2024-03-03 MED ORDER — HYDROMORPHONE HCL 2 MG PO TABS
2.0000 mg | ORAL_TABLET | ORAL | 0 refills | Status: DC | PRN
  Filled 2024-03-03 (×2): qty 45, 8d supply, fill #0

## 2024-03-03 MED ORDER — VITAMIN B-1 100 MG PO TABS
100.0000 mg | ORAL_TABLET | Freq: Every day | ORAL | 6 refills | Status: AC
  Filled 2024-03-03 (×2): qty 30, 30d supply, fill #0

## 2024-03-03 MED ORDER — CYANOCOBALAMIN 1000 MCG PO TABS
1000.0000 ug | ORAL_TABLET | Freq: Every day | ORAL | 6 refills | Status: AC
  Filled 2024-03-03 (×2): qty 30, 30d supply, fill #0

## 2024-03-04 ENCOUNTER — Telehealth: Payer: Self-pay

## 2024-03-04 ENCOUNTER — Other Ambulatory Visit: Payer: Self-pay

## 2024-03-04 ENCOUNTER — Other Ambulatory Visit: Payer: Self-pay | Admitting: Oncology

## 2024-03-04 ENCOUNTER — Encounter: Payer: Self-pay | Admitting: Oncology

## 2024-03-04 ENCOUNTER — Other Ambulatory Visit (HOSPITAL_COMMUNITY): Payer: Self-pay

## 2024-03-04 ENCOUNTER — Inpatient Hospital Stay: Payer: Self-pay | Admitting: Nurse Practitioner

## 2024-03-04 MED ORDER — ONDANSETRON 4 MG PO TBDP
8.0000 mg | ORAL_TABLET | Freq: Three times a day (TID) | ORAL | 2 refills | Status: DC | PRN
  Filled 2024-03-04: qty 120, 20d supply, fill #0

## 2024-03-04 NOTE — Telephone Encounter (Signed)
 Patient's friend, Burnard Range, called to express her concern regarding patient's on-going, uncontrolled nausea and vomiting. Scopolamine  Patch was ordered yesterday and Burnard reported that it helped a little but not enough. Palliative Care and Dr. Autumn agreed that Zophran ODT 8 mg q8h prn could be effective thus was ordered via [facility]. In addition, an infusion appointment was made for patient for tomorrow for fluids. Furthermore, a discussion is planned upon patient's next scheduled appt with Dr. Autumn to explore the patient's perspective, provide education where appropriate, and implement further planning. Burnard is aware of this plan and is informing the patient.

## 2024-03-04 NOTE — Progress Notes (Signed)
 Palliative Medicine [facility]  Telephone:([phone]) [phone] Fax:(336) [phone]   Name: Jesus Haynes Date: 03/04/2024 MRN: [id number]  DOB: October 21, 1975  Patient Care Team: [name], NP as PCP - General (Pulmonary Disease) Autumn Millman, MD as Consulting Physician (Oncology) Rosalie Kitchens, MD as Consulting Physician (Gastroenterology) Pickenpack-Cousar, Fannie SAILOR, NP as Nurse Practitioner (Hospice and Palliative Medicine)    INTERVAL HISTORY: Jesus Haynes is a 48 y.o. 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 Palliative ask to see for symptom management and goals of care.   SOCIAL HISTORY:     reports that he quit smoking about 11 years ago. His smoking use included cigarettes. He has never used smokeless tobacco. He reports that he does not drink alcohol and does not use drugs.  ADVANCE DIRECTIVES:  None on file   CODE STATUS: Full code  PAST MEDICAL HISTORY: Past Medical History:  Diagnosis Date   Asthma    Gastric adenocarcinoma (HCC)     ALLERGIES:  has no known allergies.  MEDICATIONS:  Current Outpatient Medications  Medication Sig Dispense Refill   albuterol  (PROVENTIL ) (2.5 MG/3ML) 0.083% nebulizer solution Take 3 mLs (2.5 mg total) by nebulization every 6 (six) hours as needed for wheezing or shortness of breath. 75 mL 12   albuterol  (VENTOLIN  HFA) 108 (90 Base) MCG/ACT inhaler Inhale 1-2 puffs into the lungs every 4 (four) hours as needed for wheezing or shortness of breath. 18 g 3   cyanocobalamin  1000 MCG tablet Take 1 tablet (1,000 mcg total) by mouth daily. 30 tablet 6   dexamethasone  (DECADRON ) 2 MG tablet Take 2 tablets by mouth twice daily for 3 days, then take 2 tablets by mouth once a day for 3 days, then take 1 tablet by mouth once a day for 2 days. 20 tablet 0   docusate sodium  (COLACE) 100 MG  capsule Take 1 capsule (100 mg total) by mouth daily. 30 capsule 0   DULoxetine  (CYMBALTA ) 20 MG capsule Take 1 capsule (20 mg total) by mouth daily. 30 capsule 3   folic acid  (FOLVITE ) 1 MG tablet Take 1 tablet (1 mg total) by mouth daily. 30 tablet 6   HYDROmorphone  (DILAUDID ) 2 MG tablet Take 1 tablet (2 mg total) by mouth every 4 (four) hours as needed for severe pain (pain score 7-10). 45 tablet 0   methadone  (DOLOPHINE ) 5 MG tablet Take 1 tablet (5 mg total) by mouth daily. 30 tablet 0   pantoprazole  (PROTONIX ) 40 MG tablet Take 1 tablet (40 mg total) by mouth 2 (two) times daily. 60 tablet 1   potassium chloride  (KLOR-CON ) 10 MEQ tablet Take 1 tablet (10 mEq total) by mouth daily 20 tablet 0   prochlorperazine  (COMPAZINE ) 10 MG tablet Take 0.5 tablets (5 mg total) by mouth every 6 (six) hours as needed for refractory nausea / vomiting. 30 tablet 0   prochlorperazine  (COMPAZINE ) 25 MG suppository Place 1 suppository (25 mg total) rectally every 12 (twelve) hours as needed for refractory nausea / vomiting. 12 suppository 0   scopolamine  (TRANSDERM-SCOP) 1 MG/3DAYS Place 1 patch (1 mg total) onto the skin behind ear every 3 (three) days. 10 patch 3   temazepam  (RESTORIL ) 15 MG capsule Take 1 capsule (15 mg total) by mouth at bedtime as needed for sleep. 30 capsule 0   thiamine  (VITAMIN B-1) 100 MG tablet Take 1 tablet (100 mg total)  by mouth daily. 30 tablet 6   No current facility-administered medications for this visit.    VITAL SIGNS: There were no vitals taken for this visit. There were no vitals filed for this visit.  Estimated body mass index is 18.55 kg/m as calculated from the following:   Height as of an earlier encounter on 02/26/24: 5' 6 (1.676 m).   Weight as of an earlier encounter on 02/26/24: 114 lb 14.4 oz (52.1 kg).  PERFORMANCE STATUS (ECOG) : 1 - Symptomatic but completely ambulatory  Physical Exam General: NAD, thin, weak appearing  Cardiovascular: regular rate and  rhythm Pulmonary: normal breathing pattern Extremities: no edema, no joint deformities Skin: no rashes Neurological: AAO x3  IMPRESSION: Discussed the use of AI scribe software for clinical note transcription with the patient, who gave verbal consent to proceed. History of Present Illness Jesus Haynes is a 48 year old male who presents for symptom management follow-up. He is accompanied by family. Denies concerns for constipation or diarrhea. Occasional nausea. Appetite remains challenged. Some days are better than others. Current weight is 114lbs up from 111lbs. patient was recently hospitalized for worsening paresthesias and intractable pain related to chemotherapy.  He was seen by our palliative team during that time with medication adjustments.  Treatment held today per Dr. Autumn due to failure to thrive.  He has been feeling better since last night and describes his pain as 'okay'. He has not taken any pain medication today as the pain was not severe. He reports taking Dilaudid  as needed and methadone  5 mg daily when not in the hospital, as he was told not to take methadone  in the hospital due to a prolonged QT interval.  He has been taking nausea medication daily and has been able to eat in the last couple of days. His current weight is 114 pounds. He is not using a feeding tube. He has tried various methods to take his medications, including applesauce and pudding, but prefers Jell-O shots for administration.  He is currently taking Cymbalta  (duloxetine ) and is aware that it cannot be crushed but can be opened and the contents poured out. He is not taking a stool softener.  He wants to avoid hospital visits due to dissatisfaction with hospital care, particularly regarding medication administration. He is interested in managing his care at home as much as possible to avoid hospital admissions. Patient and family also open to home based palliative for additional support.   We will continue  to closely monitor and support.  All questions answered and support provided.  Goals of care We discussed his current illness and what it means in the larger context of his ongoing comorbidities.  Natural disease trajectory and expectations were discussed.  Family are realistic in their understanding of incurable disease.  They are clear and expressed wishes to continue to Haynes the treatable allowing Mr. Loeffelholz every opportunity to continue to thrive as they feel his quality of life was much improved despite understanding of disease progression.  They are focused on his symptom management at this time feeling of his nausea and pain can get better controlled patient will continue to do well for as long as he can.  Patient and family able to speak to palliative focus treatments including current chemo regimen.  Discussions per patient and family regarding his end-of-life care preferences, including a do-not-resuscitate (DNR) order and his desire to remain at home with family if possible.  Adriene and his family expressed wishes regarding a natural  death and no life-sustaining measures.  He would like to complete DNR/MOST documentation.  Education provided on DNR/DNI and what this would like for patient in the home and or at a medical facility.  Extensive education also provided on MOST form. I completed a MOST form today. The patient and family outlined their wishes for the following treatment decisions:  Cardiopulmonary Resuscitation: Do Not Attempt Resuscitation (DNR/No CPR)  Medical Interventions: Comfort Measures: Keep clean, warm, and dry. Use medication by any route, positioning, wound care, and other measures to relieve pain and suffering. Use oxygen , suction and manual treatment of airway obstruction as needed for comfort. Do not transfer to the hospital unless comfort needs cannot be met in current location.  Antibiotics: Determine use of limitation of antibiotics when infection occurs  IV Fluids: IV  fluids for a defined trial period  Feeding Tube: No feeding tube   I discussed the importance of continued conversation with family and their medical providers regarding overall plan of care and treatment options, ensuring decisions are within the context of the patients values and GOCs. 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 Emphasis on quality of life and comfort measures. Preference for natural death without resuscitation in the event of disease progression, with comfort measures at home. Hospital care is an option if home needs cannot be met. - Discuss and completed DNR paperwork. Original provided to patient and family  - Discuss and completed medical orders for scope of treatment (MOST). Original provided to patient and family  - Establish palliative care services with [facility].   Constipation No current issues with constipation. Bowel movements occurring at least every other day.  Insomnia Insomnia previously managed with trazodone  and mirtazapine , discontinued due to adverse effects on heart rate and respiration. New prescription for Restoril  to be taken at bedtime, ensuring it is not taken with methadone  or Dilaudid  to avoid potential overdose. - Prescribe Restoril , one capsule at bedtime - Ensure Restoril  is taken at least two-three hours apart from methadone  and Dilaudid  - Follow up in two weeks to assess effectiveness of Restoril   Palliative care management for advanced malignancy Currently on a treatment break following recent hospital discharge. Plan to resume treatment in two weeks. Expressed desire to avoid hospitalizations and manage care at home as much as possible. Discussed the benefits of having both palliative and hospice care to facilitate a smooth transition when needed. - Coordinate with Megan to set up home palliative care services. - Ensure continuity of care with both palliative and hospice  services. - Discuss with the patient and family the option of transitioning to hospice care when appropriate. - Avoid unnecessary hospitalizations by managing care at home as much as possible.  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 Did not take pain medication today as pain was not severe. Prefers to avoid hospital settings where pain management may differ from home. - Continue Dilaudid  every four hours as needed for pain. - Continue methadone  5 mg daily. - Ensure prescriptions are up to date and refills are available.  Nausea due to malignancy or treatment Experiences daily nausea and is taking medication to manage it. Has been able to eat in the last couple of days, indicating some improvement in symptoms. - Continue current anti-nausea medication regimen.  Malnutrition due to malignancy (weight loss, poor oral intake) Experienced weight loss, currently weighing 114 pounds. Difficulty with oral  intake. Various methods such as applesauce, pudding, and ice cream have been tried to improve medication intake. - Try Jell-O shots to aid in medication administration.  I will plan to see patient back in 2-3 weeks.  Sooner if needed.  Patient expressed understanding and was in agreement with this plan. He also understands that He can call the clinic at any time with any questions, concerns, or complaints.   Any controlled substances utilized were prescribed in the context of palliative care. PDMP has been reviewed.   Visit consisted of counseling and education dealing with the complex and emotionally intense issues of symptom management and palliative care in the setting of serious and potentially life-threatening illness.  Levon Borer, AGPCNP-BC  Palliative Medicine Team/[facility]

## 2024-03-04 NOTE — Telephone Encounter (Signed)
 Copied from CRM #[id number]. Topic: General - Other >> Mar 04, 2024  2:11 PM Rosaria E wrote: Reason for CRM: [facility] called to report that they received the order for palliative care and plan to start care. Confirmed by sherrie.   Best contact [phone]

## 2024-03-04 NOTE — Progress Notes (Signed)
 Patient's friend, Burnard Range, called to express her concern regarding patient's on-going, uncontrolled nausea and vomiting. Scopolamine  Patch was ordered yesterday and Burnard reported that it helped a little but not enough. Palliative Care and Dr. Autumn agreed that Zophran ODT 8 mg q8h prn could be effective thus was ordered via [facility]. In addition, an infusion appointment was made for patient for tomorrow for fluids. Furthermore, a discussion is planned upon patient's next scheduled appt with Dr. Autumn to explore the patient's perspective, provided education, and further planning. Burnard is aware of this plan and is informing the patient.

## 2024-03-05 ENCOUNTER — Ambulatory Visit (HOSPITAL_BASED_OUTPATIENT_CLINIC_OR_DEPARTMENT_OTHER): Admitting: Oncology

## 2024-03-05 ENCOUNTER — Other Ambulatory Visit: Payer: Self-pay

## 2024-03-05 ENCOUNTER — Inpatient Hospital Stay: Attending: Internal Medicine

## 2024-03-05 ENCOUNTER — Encounter: Payer: Self-pay | Admitting: Oncology

## 2024-03-05 VITALS — BP 109/67 | HR 77 | Resp 14

## 2024-03-05 DIAGNOSIS — G893 Neoplasm related pain (acute) (chronic): Secondary | ICD-10-CM | POA: Diagnosis not present

## 2024-03-05 DIAGNOSIS — R112 Nausea with vomiting, unspecified: Secondary | ICD-10-CM | POA: Insufficient documentation

## 2024-03-05 DIAGNOSIS — Z95828 Presence of other vascular implants and grafts: Secondary | ICD-10-CM

## 2024-03-05 DIAGNOSIS — C160 Malignant neoplasm of cardia: Secondary | ICD-10-CM | POA: Insufficient documentation

## 2024-03-05 DIAGNOSIS — R627 Adult failure to thrive: Secondary | ICD-10-CM | POA: Diagnosis not present

## 2024-03-05 DIAGNOSIS — Z515 Encounter for palliative care: Secondary | ICD-10-CM

## 2024-03-05 MED ORDER — DEXAMETHASONE SODIUM PHOSPHATE 10 MG/ML IJ SOLN
10.0000 mg | Freq: Once | INTRAMUSCULAR | Status: AC
  Administered 2024-03-05: 10 mg via INTRAVENOUS
  Filled 2024-03-05: qty 1

## 2024-03-05 MED ORDER — ONDANSETRON HCL 4 MG/2ML IJ SOLN
8.0000 mg | Freq: Once | INTRAMUSCULAR | Status: AC
  Administered 2024-03-05: 8 mg via INTRAVENOUS
  Filled 2024-03-05: qty 4

## 2024-03-05 MED ORDER — SODIUM CHLORIDE 0.9 % IV SOLN: Freq: Once | INTRAVENOUS | Status: DC

## 2024-03-05 MED ORDER — SODIUM CHLORIDE 0.9 % IV SOLN: Freq: Once | INTRAVENOUS | Status: AC

## 2024-03-05 NOTE — Progress Notes (Unsigned)
 Call received from [facility] that they needed a hospice order, order paced and faxed to [phone]

## 2024-03-05 NOTE — Progress Notes (Signed)
 [facility]  ONCOLOGY CLINIC PROGRESS NOTE   Patient Care Team: [name], NP as PCP - General (Pulmonary Disease) Autumn Millman, MD as Consulting Physician (Oncology) Rosalie Kitchens, MD as Consulting Physician (Gastroenterology) Pickenpack-Cousar, Fannie SAILOR, NP as Nurse Practitioner [facility] and Palliative Medicine)  PATIENT NAME: Jesus Haynes   MR#: [id number] DOB: 1975/11/03  Date of visit: 03/05/2024   ASSESSMENT & PLAN:   Jesus Haynes is a 48 y.o. pleasant gentleman with history of asthma, presented to the ED on 05/27/2023 with complaints of epigastric burning abdominal pain, nausea, retching.  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  HER2/neu negative.  MMR intact.  NGS testing showed no actionable mutations.  Transition to hospice care for metastatic cancer with symptom management Condition declining due to metastatic cancer with increasing difficulty in managing symptoms such as nausea, vomiting, and pain. Chemotherapy is no longer viable due to side effects and inability to tolerate oral medications. Focus is shifting from curative treatment to palliative care, emphasizing symptom management and comfort. Patient and family informed about transition to hospice care for comprehensive symptom management at home. - Transition to hospice care for comprehensive symptom management. - Coordinate with hospice team for home-based care and symptom management. - Ensure hospice team manages all prescriptions and symptom control.  Nausea and vomiting due to metastatic cancer Persistent nausea and vomiting prevent oral intake, leading to weight loss. Unable to keep food down since last hospital discharge, except for coffee. IV fluids and antiemetics provide temporary relief, but problem persists with oral  medications. - Manage nausea and vomiting through hospice care with appropriate medications.  Chronic pain due to metastatic cancer Chronic pain management challenging due to inability to tolerate oral medications. Currently on methadone , but transition to hospice care will allow for more effective pain management strategies, potentially including transdermal options like fentanyl . - Allow hospice team to manage pain control, potentially transitioning to transdermal fentanyl .  Cancer-associated cachexia and weight loss Significant weight loss to 109 pounds due to inability to maintain oral intake from nausea and vomiting. Cachexia is a common complication in advanced cancer, contributing to overall decline. I reviewed lab results and outside records for this visit and discussed relevant results with the patient. Diagnosis, plan of care and treatment options were also discussed in detail with the patient. Opportunity provided to ask questions and answers provided to his apparent satisfaction. Provided instructions to call our clinic with any problems, questions or concerns prior to return visit. I recommended to continue follow-up with PCP and sub-specialists. He verbalized understanding and agreed with the plan.   NCCN guidelines have been consulted in the planning of this patient's care.  I spent a total of 30 minutes during this encounter with the patient including review of chart and various tests results, discussions about plan of care and coordination of care plan.   Millman Autumn, MD  03/05/2024 10:00 AM  [facility] - A DEPT OF 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  NGS testing showed no actionable mutations.  Current  Treatment: Palliative systemic treatments with FOLFOX plus nivolumab , started from 07/23/2023.  After completing 6 cycles of FOLFOX + nivolumab , transitioned to maintenance treatments with 5-FU plus nivolumab  from 11/20/2023.  Because of disease progression, treatment switched to ramucirumab  plus paclitaxel  from 01/15/2024.  Because of persistent nausea, failure to thrive, patient decided to transition to hospice as of 03/05/2024.  INTERVAL HISTORY:   Discussed the use of AI scribe software for clinical note transcription with the patient, who gave verbal consent to proceed.  History of Present Illness  Jesus Haynes is a 48 year old male with stage IV gastric cancer who presents with persistent vomiting and weight loss.  He has been experiencing persistent vomiting and has been unable to keep food down since his last hospital visit. He was able to eat two pieces of pizza and a hot dog during his last visit, but since then, he has not been able to retain any food. This pattern has been recurring, where he improves in the hospital with IV medications but relapses upon returning home and resuming oral medications.  He reports significant weight loss, now weighing 109 pounds according to home scales. Despite receiving IV fluids and nausea medications this morning, he still experiences pain, although he feels slightly better.  He is currently on methadone  for pain management, but keeping medications down is challenging due to his vomiting. His caregiver has taken him off other medications to manage his symptoms better.  He has not undergone chemotherapy for over six weeks.    I have reviewed the past medical history, past surgical history, social history and family history with the patient and they are unchanged from previous note.  HISTORY OF PRESENT ILLNESS:   Oncology History  Adenocarcinoma of gastroesophageal junction (HCC)  05/29/2023 Initial Diagnosis   Adenocarcinoma of gastroesophageal  junction (HCC)   07/18/2023 Cancer Staging   Staging form: Esophagus - Adenocarcinoma, AJCC 8th Edition - Clinical: Stage IVB (cT4, cN3, cM1, G3) - Signed by Autumn Millman, MD on 07/18/2023 Histologic grading system: 3 grade system   07/23/2023 - 01/02/2024 Chemotherapy   Patient is on Treatment Plan : GASTROESOPHAGEAL FOLFOX + Nivolumab  q14d     01/15/2024 -  Chemotherapy   Patient is on Treatment Plan : GASTROESOPHAGEAL Ramucirumab  D1, 15 + Paclitaxel  D1,8,15 q28d       48 y.o. gentleman with history of asthma, presented to the ED on 05/27/2023 with complaints of epigastric burning abdominal pain, nausea, retching.  He also reported some dark tarry stools for 3 days prior to arrival.  He was previously seen in the ED at [facility] on 05/08/2023 with complaints of chest or right upper quadrant abdominal pain.  Workup was unremarkable at that time and he was sent home with Protonix  and Carafate .  Since his symptoms persisted, he presented to the ED again.   In the ED, his hemoglobin was noted to be down to 10, compared to 13 previously.  With concern for upper GI bleed, was admitted for further evaluation and management.   Dr. Magod performed upper GI endoscopy on 05/28/2023.  It showed partially obstructing, likely malignant esophageal tumor in the lower third of the esophagus.  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  Normal duodenum.  Pathology from GE junction mass came back positive for invasive adenocarcinoma, moderately to poorly differentiated.  MMR intact.  HER2/neu negative.  Guardant 360 NGS testing showed TP53 C176F mutation.  No evidence of MSI-high status.  Low TMB (4.75 mut/Mb).  Overall no actionable mutations.   CT chest abdomen pelvis on 05/28/2023 showed abnormal  wall thickening at the GE junction, compatible with patient's known carcinoma.  Prominent lymph nodes in the retrocrural region on the right side, adjacent to the hiatal hernia.  Abnormal low-density soft tissue in  the gastrohepatic region, abutting the lesser curvature and proximal stomach, worrisome for metastatic disease.  Periportal, periceliac, gastrosplenic and left retroperitoneal lymphadenopathy, worrisome for metastatic involvement.  Hypodensities in the liver were also noted, worrisome for metastatic disease, largest 1 measuring 13 mm in the inferior right lobe.  Fat stranding surrounding the body and tail of pancreas, suggesting acute pancreatitis.   We were consulted during his hospitalization on 05/29/2023 for additional recommendations given new diagnosis of GE junction adenocarcinoma.   On 06/07/2023, staging PET/CT showed large hypermetabolic mass involving the distal esophagus and proximal stomach consistent with known primary esophageal carcinoma.  Multiple hypermetabolic retroperitoneal, lower mediastinal lymph nodes consistent with locally advanced disease.  Suspected direct tumor extension into the pancreatic body.  Hypermetabolic node or peritoneal implant within the omentum.  No other evidence of metastatic disease.   Plan was for palliative systemic treatments with FOLFOX plus nivolumab .  However patient's performance status continued to decline and he had to be hospitalized again on 06/14/2023.  He finally presented to reestablish care in our clinic on 07/18/2023.  His performance status improved with nutrition via J-tube.  Started systemic treatments from 07/23/2023.  We did dose reduce oxaliplatin  by 20% and proceeded with 68 mg/m dose.  When he presented for cycle 2 on 08/07/2023, leukopenia with white count of 3300, ANC 1100.  We started skipping 5-FU bolus and continue with the dose reduced oxaliplatin .  Restaging PET scan on 09/10/2023 showed excellent response with significant reduction in size and metabolism in the gastric mass.  Resolution of peritoneal metastatic disease.   Completed 6 cycles of FOLFOX plus nivolumab  as of 11/06/2023.  Transitioned to maintenance treatments with 5-FU  plus nivolumab  from 11/20/2023.  Because of progressive weight loss, we obtained restaging scan sooner.  On 12/30/2023, restaging PET scan showed evidence of mild disease progression with uptake in size and number of pathologic lymph nodes in the bilateral lung hilum, mediastinum and retroperitoneum.  Subtle peritoneal focus of carcinomatosis was noted.  Groundglass hazy opacity in the lungs in the right lower lobe and left upper lobe with some low-level uptake.  Since patient could not tolerate FOLFOX plus nivolumab  because of side effects, plan made to change regimen to ramucirumab  plus paclitaxel  on days 1, 8, 15 of 28-day cycle.  Treatment switched to ramucirumab  plus paclitaxel  from 01/15/2024.  Because of persistent nausea, failure to thrive, patient decided to transition to hospice as of 03/05/2024.  REVIEW OF SYSTEMS:   Review of Systems - Oncology  All other pertinent systems were reviewed with the patient and are negative.  ALLERGIES: He has no known allergies.  MEDICATIONS:  Current Outpatient Medications  Medication Sig Dispense Refill   albuterol  (PROVENTIL ) (2.5 MG/3ML) 0.083% nebulizer solution Take 3 mLs (2.5 mg total) by nebulization every 6 (six) hours as needed for wheezing or shortness of breath. 75 mL 12   albuterol  (VENTOLIN  HFA) 108 (90 Base) MCG/ACT inhaler Inhale 1-2 puffs into the lungs every 4 (four) hours as needed for wheezing or shortness of breath. 18 g 3   cyanocobalamin  1000 MCG tablet Take 1 tablet (1,000 mcg total) by mouth daily. 30 tablet 6   dexamethasone  (DECADRON ) 2 MG tablet Take 2 tablets by mouth twice daily for 3 days, then take 2 tablets by mouth once a  day for 3 days, then take 1 tablet by mouth once a day for 2 days. 20 tablet 0   docusate sodium  (COLACE) 100 MG capsule Take 1 capsule (100 mg total) by mouth daily. 30 capsule 0   DULoxetine  (CYMBALTA ) 20 MG capsule Take 1 capsule (20 mg total) by mouth daily. 30 capsule 3   folic acid  (FOLVITE ) 1  MG tablet Take 1 tablet (1 mg total) by mouth daily. 30 tablet 6   HYDROmorphone  (DILAUDID ) 2 MG tablet Take 1 tablet (2 mg total) by mouth every 4 (four) hours as needed for severe pain (pain score 7-10). 45 tablet 0   methadone  (DOLOPHINE ) 5 MG tablet Take 1 tablet (5 mg total) by mouth daily. 30 tablet 0   ondansetron  (ZOFRAN -ODT) 4 MG disintegrating tablet Take 2 tablets (8 mg total) by mouth every 8 (eight) hours as needed for nausea or vomiting. 120 tablet 2   pantoprazole  (PROTONIX ) 40 MG tablet Take 1 tablet (40 mg total) by mouth 2 (two) times daily. 60 tablet 1   potassium chloride  (KLOR-CON ) 10 MEQ tablet Take 1 tablet (10 mEq total) by mouth daily 20 tablet 0   prochlorperazine  (COMPAZINE ) 10 MG tablet Take 0.5 tablets (5 mg total) by mouth every 6 (six) hours as needed for refractory nausea / vomiting. 30 tablet 0   prochlorperazine  (COMPAZINE ) 25 MG suppository Place 1 suppository (25 mg total) rectally every 12 (twelve) hours as needed for refractory nausea / vomiting. 12 suppository 0   scopolamine  (TRANSDERM-SCOP) 1 MG/3DAYS Place 1 patch (1 mg total) onto the skin behind ear every 3 (three) days. 10 patch 3   temazepam  (RESTORIL ) 15 MG capsule Take 1 capsule (15 mg total) by mouth at bedtime as needed for sleep. 30 capsule 0   thiamine  (VITAMIN B-1) 100 MG tablet Take 1 tablet (100 mg total) by mouth daily. 30 tablet 6   No current facility-administered medications for this visit.     VITALS:   There were no vitals taken for this visit.  Wt Readings from Last 3 Encounters:  02/26/24 114 lb 14.4 oz (52.1 kg)  02/23/24 115 lb 15.4 oz (52.6 kg)  02/12/24 114 lb 11.2 oz (52 kg)    There is no height or weight on file to calculate BMI.          PHYSICAL EXAM:   Physical Exam Constitutional:      General: He is not in acute distress.    Appearance: Normal appearance.  HENT:     Head: Normocephalic and atraumatic.  Eyes:     General: No scleral icterus.     Conjunctiva/sclera: Conjunctivae normal.  Cardiovascular:     Rate and Rhythm: Normal rate and regular rhythm.     Heart sounds: Normal heart sounds.  Pulmonary:     Effort: Pulmonary effort is normal.     Breath sounds: Normal breath sounds.  Chest:     Comments: Port-A-Cath in place without signs of infection Abdominal:     General: There is no distension.  Lymphadenopathy:     Cervical: No cervical adenopathy.  Neurological:     General: No focal deficit present.     Mental Status: He is oriented to person, place, and time.  Psychiatric:        Mood and Affect: Mood normal.        Behavior: Behavior normal.      LABORATORY DATA:   I have reviewed the data as listed.  No results found  for any visits on 03/05/24.    RADIOGRAPHIC STUDIES:  DG Chest Portable 1 View CLINICAL DATA:  chest pain Hx Gastric adenocarcinoma , last chemo two weeks ago  EXAM: PORTABLE CHEST 1 VIEW  COMPARISON:  Chest x-ray 02/02/2024, PET CT 02/03/2024, CT chest 02/03/2024  FINDINGS: Right chest wall Port-A-Cath with tip overlying the expected region of the superior caval junction.  The heart and mediastinal contours are unchanged. Persistent prominent hilar regions consistent with known underlying lymphadenopathy.  No focal consolidation. No pulmonary edema. No pleural effusion. No pneumothorax.  No acute osseous abnormality.  IMPRESSION: 1. No acute cardiopulmonary abnormality. 2. Persistent prominent hilar regions consistent with known underlying lymphadenopathy. Finding better evaluated on PET CT 12/30/2023 and CT angio chest 02/03/2024.  Electronically Signed   By: Morgane  Naveau M.D.   On: 02/21/2024 22:32    CODE STATUS:  Code Status History     Date Active Date Inactive Code Status Order ID Comments User Context   06/14/2023 1621 07/11/2023 2225 Full Code [id number]  Celinda Alm Lot, MD Inpatient   06/13/2023 1253 06/14/2023 0509 Full Code [id number]  Philip Cornet,  MD [facility]   05/27/2023 1516 05/30/2023 1651 Full Code [id number]  Zella Katha HERO, MD ED    Questions for Most Recent Historical Code Status (Order [id number])     Question Answer   By: Consent: discussion documented in EHR            No orders of the defined types were placed in this encounter.   Future Appointments  Date Time Provider Department Center  03/11/2024 10:45 AM [facility] FLUSH [facility] None  03/11/2024 11:15 AM Landra Howze, MD [facility] None  03/11/2024 12:30 PM [facility] INFUSION [facility] None  03/11/2024 12:30 PM [facility] PALLIATIVE CARE [facility] None  03/11/2024  2:30 PM Ivonne Harlene RAMAN, RD [facility] None  04/01/2024  1:40 PM [name], NP [facility]     This document was completed utilizing speech recognition software. Grammatical errors, random word insertions, pronoun errors, and incomplete sentences are an occasional consequence of this system due to software limitations, ambient noise, and hardware issues. Any formal questions or concerns about the content, text or information contained within the body of this dictation should be directly addressed to the provider for clarification.

## 2024-03-06 ENCOUNTER — Other Ambulatory Visit (HOSPITAL_COMMUNITY): Payer: Self-pay

## 2024-03-06 ENCOUNTER — Inpatient Hospital Stay (HOSPITAL_COMMUNITY)
Admission: EM | Admit: 2024-03-06 | Discharge: 2024-03-10 | DRG: 391 | Disposition: A | Discharge status: Hospice | Attending: Family Medicine | Admitting: Family Medicine

## 2024-03-06 ENCOUNTER — Other Ambulatory Visit: Payer: Self-pay | Admitting: Nurse Practitioner

## 2024-03-06 ENCOUNTER — Encounter (HOSPITAL_COMMUNITY): Payer: Self-pay | Admitting: Emergency Medicine

## 2024-03-06 ENCOUNTER — Other Ambulatory Visit: Payer: Self-pay

## 2024-03-06 DIAGNOSIS — Z515 Encounter for palliative care: Secondary | ICD-10-CM

## 2024-03-06 DIAGNOSIS — Z85028 Personal history of other malignant neoplasm of stomach: Secondary | ICD-10-CM

## 2024-03-06 DIAGNOSIS — J4520 Mild intermittent asthma, uncomplicated: Secondary | ICD-10-CM | POA: Diagnosis present

## 2024-03-06 DIAGNOSIS — C169 Malignant neoplasm of stomach, unspecified: Secondary | ICD-10-CM

## 2024-03-06 DIAGNOSIS — G893 Neoplasm related pain (acute) (chronic): Secondary | ICD-10-CM | POA: Diagnosis present

## 2024-03-06 DIAGNOSIS — R112 Nausea with vomiting, unspecified: Secondary | ICD-10-CM | POA: Diagnosis not present

## 2024-03-06 DIAGNOSIS — R64 Cachexia: Secondary | ICD-10-CM | POA: Diagnosis present

## 2024-03-06 DIAGNOSIS — Z9221 Personal history of antineoplastic chemotherapy: Secondary | ICD-10-CM

## 2024-03-06 DIAGNOSIS — E43 Unspecified severe protein-calorie malnutrition: Secondary | ICD-10-CM | POA: Diagnosis present

## 2024-03-06 DIAGNOSIS — R1110 Vomiting, unspecified: Principal | ICD-10-CM

## 2024-03-06 DIAGNOSIS — R627 Adult failure to thrive: Secondary | ICD-10-CM | POA: Diagnosis present

## 2024-03-06 DIAGNOSIS — Z681 Body mass index (BMI) 19 or less, adult: Secondary | ICD-10-CM

## 2024-03-06 DIAGNOSIS — Z66 Do not resuscitate: Secondary | ICD-10-CM | POA: Diagnosis present

## 2024-03-06 DIAGNOSIS — Z87891 Personal history of nicotine dependence: Secondary | ICD-10-CM

## 2024-03-06 DIAGNOSIS — Z79899 Other long term (current) drug therapy: Secondary | ICD-10-CM

## 2024-03-06 DIAGNOSIS — D638 Anemia in other chronic diseases classified elsewhere: Secondary | ICD-10-CM | POA: Diagnosis present

## 2024-03-06 DIAGNOSIS — R54 Age-related physical debility: Secondary | ICD-10-CM | POA: Diagnosis present

## 2024-03-06 DIAGNOSIS — E876 Hypokalemia: Secondary | ICD-10-CM | POA: Diagnosis present

## 2024-03-06 DIAGNOSIS — C799 Secondary malignant neoplasm of unspecified site: Secondary | ICD-10-CM

## 2024-03-06 DIAGNOSIS — E8809 Other disorders of plasma-protein metabolism, not elsewhere classified: Secondary | ICD-10-CM | POA: Diagnosis present

## 2024-03-06 DIAGNOSIS — D696 Thrombocytopenia, unspecified: Secondary | ICD-10-CM | POA: Diagnosis present

## 2024-03-06 DIAGNOSIS — Z8249 Family history of ischemic heart disease and other diseases of the circulatory system: Secondary | ICD-10-CM

## 2024-03-06 LAB — COMPREHENSIVE METABOLIC PANEL WITH GFR
ALT: 8 U/L (ref 0–44)
AST: 17 U/L (ref 15–41)
Albumin: 3.1 g/dL — ABNORMAL LOW (ref 3.5–5.0)
Alkaline Phosphatase: 55 U/L (ref 38–126)
Anion gap: 10 (ref 5–15)
BUN: 12 mg/dL (ref 6–20)
CO2: 26 mmol/L (ref 22–32)
Calcium: 8.7 mg/dL — ABNORMAL LOW (ref 8.9–10.3)
Chloride: 100 mmol/L (ref 98–111)
Creatinine, Ser: 0.43 mg/dL — ABNORMAL LOW (ref 0.61–1.24)
GFR, Estimated: >60 mL/min (ref >60)
Glucose, Bld: 82 mg/dL (ref 70–99)
Potassium: 3.6 mmol/L (ref 3.5–5.1)
Sodium: 135 mmol/L (ref 135–145)
Total Bilirubin: 0.4 mg/dL (ref 0.0–1.2)
Total Protein: 5.2 g/dL — ABNORMAL LOW (ref 6.5–8.1)

## 2024-03-06 LAB — CBC WITH DIFFERENTIAL/PLATELET
Abs Immature Granulocytes: 0.02 K/uL (ref 0.00–0.07)
Basophils Absolute: 0 K/uL (ref 0.0–0.1)
Basophils Relative: 0 %
Eosinophils Absolute: 0 K/uL (ref 0.0–0.5)
Eosinophils Relative: 0 %
HCT: 37.4 % — ABNORMAL LOW (ref 39.0–52.0)
Hemoglobin: 11.8 g/dL — ABNORMAL LOW (ref 13.0–17.0)
Immature Granulocytes: 0 %
Lymphocytes Relative: 9 %
Lymphs Abs: 0.5 K/uL — ABNORMAL LOW (ref 0.7–4.0)
MCH: 26.9 pg (ref 26.0–34.0)
MCHC: 31.6 g/dL (ref 30.0–36.0)
MCV: 85.2 fL (ref 80.0–100.0)
Monocytes Absolute: 0.6 K/uL (ref 0.1–1.0)
Monocytes Relative: 9 %
Neutro Abs: 4.9 K/uL (ref 1.7–7.7)
Neutrophils Relative %: 82 %
Platelets: 159 K/uL (ref 150–400)
RBC: 4.39 MIL/uL (ref 4.22–5.81)
RDW: 16.9 % — ABNORMAL HIGH (ref 11.5–15.5)
WBC: 6.1 K/uL (ref 4.0–10.5)
nRBC: 0 % (ref 0.0–0.2)

## 2024-03-06 LAB — I-STAT CHEM 8, ED
BUN: 11 mg/dL (ref 6–20)
Calcium, Ion: 1.16 mmol/L (ref 1.15–1.40)
Chloride: 98 mmol/L (ref 98–111)
Creatinine, Ser: 0.5 mg/dL — ABNORMAL LOW (ref 0.61–1.24)
Glucose, Bld: 80 mg/dL (ref 70–99)
HCT: 36 % — ABNORMAL LOW (ref 39.0–52.0)
Hemoglobin: 12.2 g/dL — ABNORMAL LOW (ref 13.0–17.0)
Potassium: 3.5 mmol/L (ref 3.5–5.1)
Sodium: 135 mmol/L (ref 135–145)
TCO2: 27 mmol/L (ref 22–32)

## 2024-03-06 MED ORDER — BISACODYL 5 MG PO TBEC: 5.0000 mg | DELAYED_RELEASE_TABLET | Freq: Every day | ORAL | Status: DC | PRN

## 2024-03-06 MED ORDER — ONDANSETRON HCL 4 MG PO TABS
4.0000 mg | ORAL_TABLET | Freq: Four times a day (QID) | ORAL | Status: DC | PRN
Start: 2024-03-06 — End: 2024-03-07

## 2024-03-06 MED ORDER — PANTOPRAZOLE SODIUM 40 MG PO TBEC: 40.0000 mg | DELAYED_RELEASE_TABLET | Freq: Two times a day (BID) | ORAL | 1 refills | Status: AC

## 2024-03-06 MED ORDER — PROCHLORPERAZINE 25 MG RE SUPP: 25.0000 mg | Freq: Two times a day (BID) | RECTAL | Status: DC | PRN

## 2024-03-06 MED ORDER — SENNOSIDES-DOCUSATE SODIUM 8.6-50 MG PO TABS: 1.0000 | ORAL_TABLET | Freq: Every evening | ORAL | Status: DC | PRN

## 2024-03-06 MED ORDER — SODIUM CHLORIDE 0.9 % IV SOLN: INTRAVENOUS | Status: DC

## 2024-03-06 MED ORDER — ACETAMINOPHEN 650 MG RE SUPP: 650.0000 mg | Freq: Four times a day (QID) | RECTAL | Status: DC | PRN

## 2024-03-06 MED ORDER — SODIUM CHLORIDE 0.9 % IV BOLUS
1000.0000 mL | Freq: Once | INTRAVENOUS | Status: AC
  Administered 2024-03-06: 1000 mL via INTRAVENOUS

## 2024-03-06 MED ORDER — ONDANSETRON HCL 4 MG/2ML IJ SOLN
4.0000 mg | Freq: Four times a day (QID) | INTRAMUSCULAR | Status: DC | PRN
Start: 2024-03-06 — End: 2024-03-07
  Administered 2024-03-06: 4 mg via INTRAVENOUS
  Filled 2024-03-06: qty 2

## 2024-03-06 MED ORDER — HYDROMORPHONE HCL 1 MG/ML IJ SOLN
1.0000 mg | INTRAMUSCULAR | Status: DC | PRN
  Administered 2024-03-06: 1 mg via INTRAVENOUS
  Filled 2024-03-06: qty 1

## 2024-03-06 MED ORDER — ACETAMINOPHEN 325 MG PO TABS: 650.0000 mg | ORAL_TABLET | Freq: Four times a day (QID) | ORAL | Status: DC | PRN

## 2024-03-06 NOTE — H&P (Addendum)
 History and Physical  Jesus Haynes FMW:[id number] DOB: 1975-12-26 DOA: 03/06/2024  PCP: [name], NP   Chief Complaint: Nausea and vomiting, failure to thrive  HPI: Jesus Haynes is a 48 y.o. 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 Per friend and POA, patient has had persistent nausea and vomiting after stopping chemotherapy 6 weeks ago. Patient is on methadone  and Dilaudid  for pain however patient unable to keep any of his pills down to control his pain. Patient endorsing severe intermitted abdominal pain, fatigue, persistent nausea and inability to tolerate p.o. intake, even liquids. He has attempted to drink protein shakes but unable to keep it down. Reports he weighed 230 lbs prior to his cancer diagnosis 10 months ago and currently down to 109 lbs due to inability to maintain his nutrition. They had a follow-up with his oncologist yesterday and with shared decision making, patient was transition to home hospice. Family would like to keep patient at home as long as possible however he needs other forms of delivering his pain medications and antiemetics.  ED Course: Initial vitals show afebrile and normotensive. Initial labs significant for WBC 6.1, Hgb 11.8, glucose 82, unremarkable CMP. Pt received IV NS 1 L bolus and started on IV NS infusion. TRH was consulted for admission.   Review of Systems: Please see HPI for pertinent positives and negatives. A complete 10 system review of systems are otherwise negative.  Past Medical History:  Diagnosis Date   Asthma    Gastric adenocarcinoma [facility])    Past Surgical History:  Procedure Laterality Date   BIOPSY  05/28/2023   Procedure: BIOPSY;  Surgeon: Rosalie Kitchens, MD;  Location: [facility];  Service:  Gastroenterology;;   ESOPHAGOGASTRODUODENOSCOPY (EGD) WITH PROPOFOL  N/A 05/28/2023   Procedure: ESOPHAGOGASTRODUODENOSCOPY (EGD) WITH PROPOFOL ;  Surgeon: Rosalie Kitchens, MD;  Location: [facility];  Service: Gastroenterology;  Laterality: N/A;   IR IMAGING GUIDED PORT INSERTION  06/13/2023   UMBILICAL HERNIA REPAIR N/A 06/18/2023   Procedure: PLACEMENT OF J TUBE;  Surgeon: Dasie Leonor CROME, MD;  Location: [facility];  Service: General;  Laterality: N/A;   Social History:  reports that he quit smoking about 11 years ago. His smoking use included cigarettes. He has never used smokeless tobacco. He reports that he does not drink alcohol and does not use drugs.  No Known Allergies  Family History  Problem Relation Age of Onset   Heart failure Father      Prior to Admission medications   Medication Sig Start Date End Date Taking? Authorizing Provider  albuterol  (PROVENTIL ) (2.5 MG/3ML) 0.083% nebulizer solution Take 3 mLs (2.5 mg total) by nebulization every 6 (six) hours as needed for wheezing or shortness of breath. 08/07/23   Pickenpack-Cousar, Athena N, NP  albuterol  (VENTOLIN  HFA) 108 (90 Base) MCG/ACT inhaler Inhale 1-2 puffs into the lungs every 4 (four) hours as needed for wheezing or shortness of breath. 01/31/24   Pasam, Chinita, MD  cyanocobalamin  1000 MCG tablet Take 1 tablet (1,000 mcg total) by mouth daily. 03/03/24   Pickenpack-Cousar, Athena N, NP  dexamethasone  (DECADRON ) 2 MG tablet Take 2 tablets by mouth twice daily for 3 days, then take 2 tablets by mouth once a day for 3 days, then take 1 tablet by mouth once a day for 2 days. 03/03/24  Pickenpack-Cousar, Fannie SAILOR, NP  docusate sodium  (COLACE) 100 MG capsule Take 1 capsule (100 mg total) by mouth daily. 02/24/24   Pokhrel, Laxman, MD  DULoxetine  (CYMBALTA ) 20 MG capsule Take 1 capsule (20 mg total) by mouth daily. 03/03/24   Pickenpack-Cousar, Athena N, NP  folic acid  (FOLVITE ) 1 MG tablet Take 1 tablet (1 mg total) by mouth daily. 10/14/23    Pickenpack-Cousar, Athena N, NP  HYDROmorphone  (DILAUDID ) 2 MG tablet Take 1 tablet (2 mg total) by mouth every 4 (four) hours as needed for severe pain (pain score 7-10). 03/03/24   Pickenpack-Cousar, Fannie SAILOR, NP  methadone  (DOLOPHINE ) 5 MG tablet Take 1 tablet (5 mg total) by mouth daily. 02/18/24   Pickenpack-Cousar, Athena N, NP  ondansetron  (ZOFRAN -ODT) 4 MG disintegrating tablet Take 2 tablets (8 mg total) by mouth every 8 (eight) hours as needed for nausea or vomiting. 03/04/24   Pasam, Chinita, MD  pantoprazole  (PROTONIX ) 40 MG tablet Take 1 tablet (40 mg total) by mouth 2 (two) times daily. 03/06/24 05/05/24  [name], NP  potassium chloride  (KLOR-CON ) 10 MEQ tablet Take 1 tablet (10 mEq total) by mouth daily 02/25/24 03/24/24  Nichols, Tonya S, NP  prochlorperazine  (COMPAZINE ) 10 MG tablet Take 0.5 tablets (5 mg total) by mouth every 6 (six) hours as needed for refractory nausea / vomiting. 02/24/24   Pokhrel, Laxman, MD  prochlorperazine  (COMPAZINE ) 25 MG suppository Place 1 suppository (25 mg total) rectally every 12 (twelve) hours as needed for refractory nausea / vomiting. 02/24/24   Pokhrel, Laxman, MD  scopolamine  (TRANSDERM-SCOP) 1 MG/3DAYS Place 1 patch (1 mg total) onto the skin behind ear every 3 (three) days. 03/03/24   Pickenpack-Cousar, Athena N, NP  temazepam  (RESTORIL ) 15 MG capsule Take 1 capsule (15 mg total) by mouth at bedtime as needed for sleep. 02/12/24   Pickenpack-Cousar, Fannie SAILOR, NP  thiamine  (VITAMIN B-1) 100 MG tablet Take 1 tablet (100 mg total) by mouth daily. 03/03/24   Pickenpack-Cousar, Fannie SAILOR, NP    Physical Exam: BP 123/85 (BP Location: Left Arm)   Pulse (!) 53   Temp 98.4 F (36.9 C) (Oral)   Resp 18   Ht 5' 6 (1.676 m)   Wt 49.7 kg   SpO2 91%   BMI 17.67 kg/m  General: Very cachectic and frail middle-age man laying in bed. No acute distress. HEENT: [employer]/AT. Anicteric sclera. Dry mucous membrane. CV: RRR. No murmurs, rubs, or gallops. No LE  edema Pulmonary: Lungs CTAB. Normal effort. No wheezing or rales. Abdominal: Soft. Severe generalized tenderness to palpation. Loss of abdominal muscle and fat. Normal bowel sounds. Extremities: Palpable radial and DP pulses. Normal ROM. Skin: Warm and dry. No obvious rash or lesions. Neuro: A&Ox3. Moves all extremities. Normal sensation to light touch. No focal deficit. Psych: Normal mood and affect          Labs on Admission:  Basic Metabolic Panel: Recent Labs  Lab 03/06/24 2206  NA 135  135  K 3.6  3.5  CL 100  98  CO2 26  GLUCOSE 82  80  BUN 12  11  CREATININE 0.43*  0.50*  CALCIUM  8.7*   Liver Function Tests: Recent Labs  Lab 03/06/24 2206  AST 17  ALT 8  ALKPHOS 55  BILITOT 0.4  PROT 5.2*  ALBUMIN 3.1*   No results for input(s): LIPASE, AMYLASE in the last 168 hours. No results for input(s): AMMONIA in the last 168 hours. CBC: Recent Labs  Lab  03/06/24 2205 03/06/24 2206  WBC 6.1  --   NEUTROABS 4.9  --   HGB 11.8* 12.2*  HCT 37.4* 36.0*  MCV 85.2  --   PLT 159  --    Cardiac Enzymes: No results for input(s): CKTOTAL, CKMB, CKMBINDEX, TROPONINI in the last 168 hours. BNP (last 3 results) No results for input(s): BNP in the last 8760 hours.  ProBNP (last 3 results) No results for input(s): PROBNP in the last 8760 hours.  CBG: No results for input(s): GLUCAP in the last 168 hours.  Radiological Exams on Admission: No results found. Assessment/Plan Jesus Haynes is a 48 y.o. male with medical history significant for metastatic gastric adenocarcinoma recently stopped chemotherapy, mild intermittent asthma, cancer related pain and anemia of chronic disease who presented to the ED for evaluation of persistent nausea and vomiting and failure to thrive and admitted for intractable nausea and vomiting.  # Intractable nausea and vomiting # Metastatic gastric cancer # Cancer-related pain - Gastric adenocarcinoma initially  diagnosed on 05/2023, started chemotherapy in January 2025 with FOLFOX plus nivolumab   - Due to progression of disease, patient switched to ramucirumab  plus paclitaxel  in July 2025 - chemotherapy discontinued 6 weeks ago due to side effects - Has had gradual decline and failure to thrive since then and due to his persistent nausea with inability to keep any food or medication down, he was seen by his oncologist on 9/4 and transition to hospice. - Per family, they will prefer to keep patient on home hospice if possible - Another route for pain med delivery at home will be transdermal however unsure if this will be adequate to control his pain at home - Palliative care consulted for symptomatic management - Continue IV NS 125 cc/h - Continue on scopolamine  patch - Antiemetic with PRN IV Zofran  and Phenergan suppository - Pain control with PRN IV Dilaudid   # Protein caloric malnutrition # Unintentional weight loss # Failure to thrive - Body mass index is 17.67 kg/m. - In the setting of metastatic disease - Patient currently on hospice care - IV fluid hydration as above   DVT prophylaxis: SCDs    Code Status: Limited: Do not attempt resuscitation (DNR) -DNR-LIMITED -Do Not Intubate/DNI   Consults called: Palliative care  Family Communication: Discussed admission with friend/POA at bedside  Severity of Illness: The appropriate patient status for this patient is OBSERVATION. Observation status is judged to be reasonable and necessary in order to provide the required intensity of service to ensure the patient's safety. The patient's presenting symptoms, physical exam findings, and initial radiographic and laboratory data in the context of their medical condition is felt to place them at decreased risk for further clinical deterioration. Furthermore, it is anticipated that the patient will be medically stable for discharge from the hospital within 2 midnights of admission.   Level of care:  Med-Surg   This record has been created using Conservation officer, historic buildings. Errors have been sought and corrected, but may not always be located. Such creation errors do not reflect on the standard of care.   Lou Claretta HERO, MD 03/07/2024, 2:31 AM [facility] Pager: [phone] Isaiah 41:10   If 7PM-7AM, please contact night-coverage [email or web address] Password [email or web address]

## 2024-03-06 NOTE — ED Triage Notes (Signed)
 Patient c/o nausea and vomiting x 6 weeks. Family report he was transitioned to Hospice care and start on P.O. medication. Family report patient unable to keep anything down and unable to take his medication for pain. Hx metastatic gastric adenocarcinoma

## 2024-03-06 NOTE — ED Provider Notes (Signed)
 [place] EMERGENCY DEPARTMENT AT [facility] Provider Note   CSN: [id number] Arrival date & time: 03/06/24  2036     Patient presents with: Emesis   Jesus Haynes is a 48 y.o. male.   48 year old male with history of gastric adenocarcinoma presents with worsening vomiting as well as abdominal discomfort.  Patient just recently transition to hospice.  Family is here because patient cannot keep down his pain medication.  He has had worsening weakness.  Not receiving any nutrition.  No reported fevers.  Pain is unchanged with his chronic discomfort       Prior to Admission medications   Medication Sig Start Date End Date Taking? Authorizing Provider  albuterol  (PROVENTIL ) (2.5 MG/3ML) 0.083% nebulizer solution Take 3 mLs (2.5 mg total) by nebulization every 6 (six) hours as needed for wheezing or shortness of breath. 08/07/23   Pickenpack-Cousar, Fannie SAILOR, NP  albuterol  (VENTOLIN  HFA) 108 (90 Base) MCG/ACT inhaler Inhale 1-2 puffs into the lungs every 4 (four) hours as needed for wheezing or shortness of breath. 01/31/24   Pasam, Chinita, MD  cyanocobalamin  1000 MCG tablet Take 1 tablet (1,000 mcg total) by mouth daily. 03/03/24   Pickenpack-Cousar, Athena N, NP  dexamethasone  (DECADRON ) 2 MG tablet Take 2 tablets by mouth twice daily for 3 days, then take 2 tablets by mouth once a day for 3 days, then take 1 tablet by mouth once a day for 2 days. 03/03/24   Pickenpack-Cousar, Fannie SAILOR, NP  docusate sodium  (COLACE) 100 MG capsule Take 1 capsule (100 mg total) by mouth daily. 02/24/24   Pokhrel, Laxman, MD  DULoxetine  (CYMBALTA ) 20 MG capsule Take 1 capsule (20 mg total) by mouth daily. 03/03/24   Pickenpack-Cousar, Athena N, NP  folic acid  (FOLVITE ) 1 MG tablet Take 1 tablet (1 mg total) by mouth daily. 10/14/23   Pickenpack-Cousar, Athena N, NP  HYDROmorphone  (DILAUDID ) 2 MG tablet Take 1 tablet (2 mg total) by mouth every 4 (four) hours as needed for severe pain (pain score 7-10). 03/03/24    Pickenpack-Cousar, Fannie SAILOR, NP  methadone  (DOLOPHINE ) 5 MG tablet Take 1 tablet (5 mg total) by mouth daily. 02/18/24   Pickenpack-Cousar, Athena N, NP  ondansetron  (ZOFRAN -ODT) 4 MG disintegrating tablet Take 2 tablets (8 mg total) by mouth every 8 (eight) hours as needed for nausea or vomiting. 03/04/24   Pasam, Chinita, MD  pantoprazole  (PROTONIX ) 40 MG tablet Take 1 tablet (40 mg total) by mouth 2 (two) times daily. 03/06/24 05/05/24  [name], NP  potassium chloride  (KLOR-CON ) 10 MEQ tablet Take 1 tablet (10 mEq total) by mouth daily 02/25/24 03/24/24  Nichols, Tonya S, NP  prochlorperazine  (COMPAZINE ) 10 MG tablet Take 0.5 tablets (5 mg total) by mouth every 6 (six) hours as needed for refractory nausea / vomiting. 02/24/24   Pokhrel, Laxman, MD  prochlorperazine  (COMPAZINE ) 25 MG suppository Place 1 suppository (25 mg total) rectally every 12 (twelve) hours as needed for refractory nausea / vomiting. 02/24/24   Pokhrel, Vernal, MD  scopolamine  (TRANSDERM-SCOP) 1 MG/3DAYS Place 1 patch (1 mg total) onto the skin behind ear every 3 (three) days. 03/03/24   Pickenpack-Cousar, Athena N, NP  temazepam  (RESTORIL ) 15 MG capsule Take 1 capsule (15 mg total) by mouth at bedtime as needed for sleep. 02/12/24   Pickenpack-Cousar, Athena N, NP  thiamine  (VITAMIN B-1) 100 MG tablet Take 1 tablet (100 mg total) by mouth daily. 03/03/24   Pickenpack-Cousar, Athena N, NP    Allergies:  Patient has no known allergies.    Review of Systems  All other systems reviewed and are negative.   Updated Vital Signs BP 115/80 (BP Location: Left Arm)   Pulse 66   Temp 98.5 F (36.9 C) (Oral)   Resp 15   SpO2 94%   Physical Exam Vitals and nursing note reviewed.  Constitutional:      General: He is awake. He is not in acute distress.    Appearance: He is cachectic. He is ill-appearing.  HENT:     Head: Normocephalic and atraumatic.  Eyes:     General: Lids are normal.     Conjunctiva/sclera: Conjunctivae  normal.     Pupils: Pupils are equal, round, and reactive to light.  Neck:     Thyroid : No thyroid  mass.     Trachea: No tracheal deviation.  Cardiovascular:     Rate and Rhythm: Normal rate and regular rhythm.     Heart sounds: Normal heart sounds. No murmur heard.    No gallop.  Pulmonary:     Effort: Pulmonary effort is normal. No respiratory distress.     Breath sounds: Normal breath sounds. No stridor. No decreased breath sounds, wheezing, rhonchi or rales.  Abdominal:     General: There is no distension.     Palpations: Abdomen is soft.     Tenderness: There is no abdominal tenderness. There is no guarding or rebound.  Musculoskeletal:        General: No tenderness. Normal range of motion.     Cervical back: Normal range of motion and neck supple.  Skin:    General: Skin is warm and dry.     Findings: No abrasion or rash.  Neurological:     Mental Status: He is oriented to person, place, and time. Mental status is at baseline.     GCS: GCS eye subscore is 4. GCS verbal subscore is 5. GCS motor subscore is 6.     Cranial Nerves: No cranial nerve deficit.     Sensory: No sensory deficit.     Motor: Motor function is intact.  Psychiatric:        Attention and Perception: Attention normal.     (all labs ordered are listed, but only abnormal results are displayed) Labs Reviewed  CBC WITH DIFFERENTIAL/PLATELET  I-STAT CHEM 8, ED    EKG: None  Radiology: No results found.   Procedures   Medications Ordered in the ED  0.9 %  sodium chloride  infusion (has no administration in time range)  sodium chloride  0.9 % bolus 1,000 mL (has no administration in time range)                                    Medical Decision Making Amount and/or Complexity of Data Reviewed Labs: ordered.  Risk Prescription drug management.   Patient given IV fluids here at this time.  Labs reviewed.  Patient unable to keep down his pain medication.  This is despite antiemetics.  Will  need to be admitted for inpatient hospice     Final diagnoses:  None    ED Discharge Orders     None          Dasie Faden, MD 03/06/24 2223

## 2024-03-07 ENCOUNTER — Other Ambulatory Visit (HOSPITAL_COMMUNITY): Payer: Self-pay

## 2024-03-07 ENCOUNTER — Encounter: Payer: Self-pay | Admitting: Oncology

## 2024-03-07 DIAGNOSIS — R112 Nausea with vomiting, unspecified: Secondary | ICD-10-CM | POA: Diagnosis present

## 2024-03-07 DIAGNOSIS — C169 Malignant neoplasm of stomach, unspecified: Secondary | ICD-10-CM | POA: Diagnosis not present

## 2024-03-07 DIAGNOSIS — E876 Hypokalemia: Secondary | ICD-10-CM | POA: Diagnosis present

## 2024-03-07 DIAGNOSIS — E8809 Other disorders of plasma-protein metabolism, not elsewhere classified: Secondary | ICD-10-CM | POA: Diagnosis present

## 2024-03-07 DIAGNOSIS — Z515 Encounter for palliative care: Secondary | ICD-10-CM | POA: Diagnosis not present

## 2024-03-07 DIAGNOSIS — Z85028 Personal history of other malignant neoplasm of stomach: Secondary | ICD-10-CM | POA: Diagnosis not present

## 2024-03-07 DIAGNOSIS — R1110 Vomiting, unspecified: Secondary | ICD-10-CM | POA: Diagnosis present

## 2024-03-07 DIAGNOSIS — G893 Neoplasm related pain (acute) (chronic): Secondary | ICD-10-CM | POA: Diagnosis present

## 2024-03-07 DIAGNOSIS — Z66 Do not resuscitate: Secondary | ICD-10-CM | POA: Diagnosis present

## 2024-03-07 DIAGNOSIS — Z79899 Other long term (current) drug therapy: Secondary | ICD-10-CM | POA: Diagnosis not present

## 2024-03-07 DIAGNOSIS — D638 Anemia in other chronic diseases classified elsewhere: Secondary | ICD-10-CM | POA: Diagnosis present

## 2024-03-07 DIAGNOSIS — R54 Age-related physical debility: Secondary | ICD-10-CM | POA: Diagnosis present

## 2024-03-07 DIAGNOSIS — Z681 Body mass index (BMI) 19 or less, adult: Secondary | ICD-10-CM | POA: Diagnosis not present

## 2024-03-07 DIAGNOSIS — C799 Secondary malignant neoplasm of unspecified site: Secondary | ICD-10-CM

## 2024-03-07 DIAGNOSIS — Z9221 Personal history of antineoplastic chemotherapy: Secondary | ICD-10-CM | POA: Diagnosis not present

## 2024-03-07 DIAGNOSIS — Z7189 Other specified counseling: Secondary | ICD-10-CM

## 2024-03-07 DIAGNOSIS — Z8249 Family history of ischemic heart disease and other diseases of the circulatory system: Secondary | ICD-10-CM | POA: Diagnosis not present

## 2024-03-07 DIAGNOSIS — J4520 Mild intermittent asthma, uncomplicated: Secondary | ICD-10-CM | POA: Diagnosis present

## 2024-03-07 DIAGNOSIS — R627 Adult failure to thrive: Secondary | ICD-10-CM | POA: Diagnosis present

## 2024-03-07 DIAGNOSIS — R64 Cachexia: Secondary | ICD-10-CM | POA: Diagnosis present

## 2024-03-07 DIAGNOSIS — Z87891 Personal history of nicotine dependence: Secondary | ICD-10-CM | POA: Diagnosis not present

## 2024-03-07 DIAGNOSIS — E43 Unspecified severe protein-calorie malnutrition: Secondary | ICD-10-CM | POA: Diagnosis present

## 2024-03-07 DIAGNOSIS — D696 Thrombocytopenia, unspecified: Secondary | ICD-10-CM | POA: Diagnosis present

## 2024-03-07 LAB — BASIC METABOLIC PANEL WITH GFR
Anion gap: 10 (ref 5–15)
BUN: 11 mg/dL (ref 6–20)
CO2: 25 mmol/L (ref 22–32)
Calcium: 8.2 mg/dL — ABNORMAL LOW (ref 8.9–10.3)
Chloride: 101 mmol/L (ref 98–111)
Creatinine, Ser: 0.41 mg/dL — ABNORMAL LOW (ref 0.61–1.24)
GFR, Estimated: >60 mL/min (ref >60)
Glucose, Bld: 71 mg/dL (ref 70–99)
Potassium: 3.6 mmol/L (ref 3.5–5.1)
Sodium: 136 mmol/L (ref 135–145)

## 2024-03-07 LAB — CBC
HCT: 35.6 % — ABNORMAL LOW (ref 39.0–52.0)
Hemoglobin: 10.7 g/dL — ABNORMAL LOW (ref 13.0–17.0)
MCH: 25.8 pg — ABNORMAL LOW (ref 26.0–34.0)
MCHC: 30.1 g/dL (ref 30.0–36.0)
MCV: 86 fL (ref 80.0–100.0)
Platelets: 148 K/uL — ABNORMAL LOW (ref 150–400)
RBC: 4.14 MIL/uL — ABNORMAL LOW (ref 4.22–5.81)
RDW: 16.9 % — ABNORMAL HIGH (ref 11.5–15.5)
WBC: 4.3 K/uL (ref 4.0–10.5)
nRBC: 0 % (ref 0.0–0.2)

## 2024-03-07 MED ORDER — SODIUM CHLORIDE 0.9% FLUSH
10.0000 mL | INTRAVENOUS | Status: DC | PRN
  Administered 2024-03-08: 10 mL

## 2024-03-07 MED ORDER — HYDROMORPHONE HCL-NACL 50-0.9 MG/50ML-% IV SOLN
0.5000 mg/h | INTRAVENOUS | 0 refills | Status: DC
  Filled 2024-03-07: qty 50, fill #0

## 2024-03-07 MED ORDER — HYDROMORPHONE HCL-NACL 50-0.9 MG/50ML-% IV SOLN
1.3000 mg/h | INTRAVENOUS | Status: DC
  Administered 2024-03-07: 0.5 mg/h via INTRAVENOUS
  Administered 2024-03-08: 1 mg/h via INTRAVENOUS
  Filled 2024-03-07 (×3): qty 50

## 2024-03-07 MED ORDER — ONDANSETRON HCL 4 MG/2ML IJ SOLN
4.0000 mg | Freq: Three times a day (TID) | INTRAMUSCULAR | Status: DC
  Administered 2024-03-07 – 2024-03-10 (×9): 4 mg via INTRAVENOUS
  Filled 2024-03-07 (×9): qty 2

## 2024-03-07 MED ORDER — BISACODYL 5 MG PO TBEC
5.0000 mg | DELAYED_RELEASE_TABLET | Freq: Every day | ORAL | 0 refills | Status: AC | PRN
  Filled 2024-03-07: qty 30, 30d supply, fill #0

## 2024-03-07 MED ORDER — ONDANSETRON HCL 4 MG PO TABS
4.0000 mg | ORAL_TABLET | Freq: Three times a day (TID) | ORAL | 0 refills | Status: AC
  Filled 2024-03-07: qty 20, 7d supply, fill #0

## 2024-03-07 MED ORDER — SENNOSIDES-DOCUSATE SODIUM 8.6-50 MG PO TABS
1.0000 | ORAL_TABLET | Freq: Every evening | ORAL | 0 refills | Status: AC | PRN
  Filled 2024-03-07: qty 30, 30d supply, fill #0

## 2024-03-07 MED ORDER — ENSURE PLUS HIGH PROTEIN PO LIQD
237.0000 mL | Freq: Two times a day (BID) | ORAL | Status: DC
  Administered 2024-03-08: 237 mL via ORAL

## 2024-03-07 MED ORDER — SODIUM CHLORIDE 0.9% FLUSH
10.0000 mL | Freq: Two times a day (BID) | INTRAVENOUS | Status: DC
  Administered 2024-03-07 (×3): 10 mL
  Administered 2024-03-08: 20 mL
  Administered 2024-03-09 – 2024-03-10 (×2): 10 mL

## 2024-03-07 MED ORDER — ONDANSETRON HCL 4 MG PO TABS: 4.0000 mg | ORAL_TABLET | Freq: Three times a day (TID) | ORAL | Status: DC

## 2024-03-07 MED ORDER — HYDROMORPHONE HCL 1 MG/ML IJ SOLN
1.0000 mg | Freq: Once | INTRAMUSCULAR | Status: AC
  Administered 2024-03-07: 1 mg via INTRAVENOUS
  Filled 2024-03-07: qty 1

## 2024-03-07 MED ORDER — HYDROMORPHONE HCL 1 MG/ML IJ SOLN
1.0000 mg | INTRAMUSCULAR | Status: DC | PRN
  Administered 2024-03-07 – 2024-03-10 (×16): 1 mg via INTRAVENOUS
  Filled 2024-03-07 (×17): qty 1

## 2024-03-07 MED ORDER — HYDROMORPHONE HCL 1 MG/ML IJ SOLN
1.0000 mg | INTRAMUSCULAR | Status: DC | PRN
  Administered 2024-03-07: 1 mg via INTRAVENOUS
  Filled 2024-03-07: qty 1

## 2024-03-07 MED ORDER — SODIUM CHLORIDE 0.9% FLUSH
10.0000 mL | INTRAVENOUS | 0 refills | Status: AC | PRN
  Filled 2024-03-07: qty 120, 3d supply, fill #0

## 2024-03-07 MED ORDER — CHLORHEXIDINE GLUCONATE CLOTH 2 % EX PADS: 6.0000 | MEDICATED_PAD | Freq: Every day | CUTANEOUS | Status: DC

## 2024-03-07 NOTE — Plan of Care (Signed)
  Problem: Education: Goal: Knowledge of General Education information will improve Description: Including pain rating scale, medication(s)/side effects and non-pharmacologic comfort measures Outcome: Progressing   Problem: Health Behavior/Discharge Planning: Goal: Ability to manage health-related needs will improve Outcome: Progressing   Problem: Clinical Measurements: Goal: Will remain free from infection Outcome: Progressing   Problem: Clinical Measurements: Goal: Ability to maintain clinical measurements within normal limits will improve Outcome: Not Progressing Goal: Diagnostic test results will improve Outcome: Not Progressing

## 2024-03-07 NOTE — Consult Note (Signed)
 Consultation Note Date: 03/07/2024   Patient Name: Jesus Haynes  DOB: 08-03-1975  MRN: [id number]  Age / Sex: 48 y.o., male  PCP: [name], NP Referring Physician: Sherrill Alejandro Donovan, DO  Reason for Consultation: Establishing goals of care  HPI/Patient Profile: 48 y.o. male admitted on 03/06/2024.   Clinical Assessment and Goals of Care: 48 year old gentleman, sees Dr. Autumn from medical oncology, life-limiting illness of metastatic gastric adenocarcinoma, recently stopped chemotherapy, history of mild intermittent asthma and cancer related pain, anemia of chronic disease Patient admitted to hospital medicine service as he came into the emergency department for evaluation of persistent nausea vomiting failure to thrive and not able to keep anything down. Patient is on methadone  and Dilaudid  Patient has recently established with a local hospice agency  Patient has been admitted for intractable nausea vomiting in the setting of metastatic gastric cancer and cancer related pain. Patient has been given IV fluids and has been placed on IV as needed Dilaudid  and Zofran  as well as Phenergan suppositories.    Palliative consultation for symptom management as well as for ongoing goals of care discussions has been requested Chart reviewed Patient seen Discussed with patient and family present at bedside  Palliative medicine is specialized medical care for people living with serious illness. It focuses on providing relief from the symptoms and stress of a serious illness. The goal is to improve quality of life for both the patient and the family.  Goals of care: Broad aims of medical therapy in relation to the patient's values and preferences. Our aim is to provide medical care aimed at enabling patients to achieve the goals that matter most to them, given the circumstances of their particular medical  situation and their constraints.    NEXT OF KIN HCPOA [place] [phone]  SUMMARY OF RECOMMENDATIONS   1.  Symptom management: Start low-dose continuous hydromorphone  infusion Schedule IV Zofran  Otherwise, continue IV fluids and other pain and non-- pain symptom management regimen 2.  Goals of care and disposition options.  Patient's goals wishes and values elicited.  He would like to remain home and at present is not in favor of being transferred to residential hospice setting.  Will request TOC assistance, patient's family would like to switch hospice organization.  Patient will require a CADD pump of IV Dilaudid  via his port to go home with hospice support.  Okay to advance diet as tolerated as primary goals are along the lines of comfort-focused care only. Palliative care will follow closely, thank you for the consult  Code Status/Advance Care Planning: DNR   Symptom Management:     Palliative Prophylaxis:  Frequent Pain Assessment  Additional Recommendations (Limitations, Scope, Preferences): Full Comfort Care  Psycho-social/Spiritual:  Desire for further Chaplaincy support:yes Additional Recommendations: Education on Hospice  Prognosis:  < 4 weeks  Discharge Planning: Home with Hospice      Primary Diagnoses: Present on Admission:  Intractable nausea and vomiting  Cancer related pain  Severe protein-calorie malnutrition (HCC)   I have  reviewed the medical record, interviewed the patient and family, and examined the patient. The following aspects are pertinent.  Past Medical History:  Diagnosis Date   Asthma    Gastric adenocarcinoma (HCC)    Social History   Socioeconomic History   Marital status: Single    Spouse name: Not on file   Number of children: Not on file   Years of education: Not on file   Highest education level: Not on file  Occupational History   Not on file  Tobacco Use   Smoking status: Former    Current packs/day: 0.00     Types: Cigarettes    Quit date: 12/26/2012    Years since quitting: 11.2   Smokeless tobacco: Never  Vaping Use   Vaping status: Former   Quit date: 08/21/2018   Substances: Nicotine  Substance and Sexual Activity   Alcohol use: No   Drug use: No   Sexual activity: Not Currently  Other Topics Concern   Not on file  Social History Narrative   Not on file   Social Drivers of Health   Financial Resource Strain: Not on file  Food Insecurity: No Food Insecurity (03/07/2024)   Hunger Vital Sign    Worried About Running Out of Food in the Last Year: Never true    Ran Out of Food in the Last Year: Never true  Transportation Needs: No Transportation Needs (03/07/2024)   PRAPARE - Administrator, Civil Service (Medical): No    Lack of Transportation (Non-Medical): No  Physical Activity: Not on file  Stress: Not on file  Social Connections: Unknown (07/30/2023)   Social Connection and Isolation Panel    Frequency of Communication with Friends and Family: More than three times a week    Frequency of Social Gatherings with Friends and Family: More than three times a week    Attends Religious Services: Patient declined    Database administrator or Organizations: No    Attends Engineer, structural: Never    Marital Status: Never married   Family History  Problem Relation Age of Onset   Heart failure Father    Scheduled Meds:  Chlorhexidine  Gluconate Cloth  6 each Topical Daily   ondansetron   4 mg Oral Q8H   Or   ondansetron  (ZOFRAN ) IV  4 mg Intravenous Q8H   sodium chloride  flush  10-40 mL Intracatheter Q12H   Continuous Infusions:  sodium chloride  125 mL/hr at 03/07/24 0612   HYDROmorphone      PRN Meds:.acetaminophen  **OR** acetaminophen , bisacodyl , HYDROmorphone  (DILAUDID ) injection, prochlorperazine , senna-docusate, sodium chloride  flush Medications Prior to Admission:  Prior to Admission medications   Medication Sig Start Date End Date Taking? Authorizing  Provider  albuterol  (PROVENTIL ) (2.5 MG/3ML) 0.083% nebulizer solution Take 3 mLs (2.5 mg total) by nebulization every 6 (six) hours as needed for wheezing or shortness of breath. 08/07/23  Yes Pickenpack-Cousar, Fannie SAILOR, NP  albuterol  (VENTOLIN  HFA) 108 (90 Base) MCG/ACT inhaler Inhale 1-2 puffs into the lungs every 4 (four) hours as needed for wheezing or shortness of breath. 01/31/24  Yes Pasam, Avinash, MD  dexamethasone  (DECADRON ) 2 MG tablet Take 2 tablets by mouth twice daily for 3 days, then take 2 tablets by mouth once a day for 3 days, then take 1 tablet by mouth once a day for 2 days. 03/03/24  Yes Pickenpack-Cousar, Fannie SAILOR, NP  cyanocobalamin  1000 MCG tablet Take 1 tablet (1,000 mcg total) by mouth daily. 03/03/24   Pickenpack-Cousar,  Fannie SAILOR, NP  docusate sodium  (COLACE) 100 MG capsule Take 1 capsule (100 mg total) by mouth daily. 02/24/24   Pokhrel, Laxman, MD  DULoxetine  (CYMBALTA ) 20 MG capsule Take 1 capsule (20 mg total) by mouth daily. 03/03/24   Pickenpack-Cousar, Athena N, NP  folic acid  (FOLVITE ) 1 MG tablet Take 1 tablet (1 mg total) by mouth daily. 10/14/23   Pickenpack-Cousar, Athena N, NP  HYDROmorphone  (DILAUDID ) 2 MG tablet Take 1 tablet (2 mg total) by mouth every 4 (four) hours as needed for severe pain (pain score 7-10). 03/03/24   Pickenpack-Cousar, Fannie SAILOR, NP  methadone  (DOLOPHINE ) 5 MG tablet Take 1 tablet (5 mg total) by mouth daily. 02/18/24   Pickenpack-Cousar, Athena N, NP  ondansetron  (ZOFRAN -ODT) 4 MG disintegrating tablet Take 2 tablets (8 mg total) by mouth every 8 (eight) hours as needed for nausea or vomiting. 03/04/24   Pasam, Chinita, MD  pantoprazole  (PROTONIX ) 40 MG tablet Take 1 tablet (40 mg total) by mouth 2 (two) times daily. 03/06/24 05/05/24  [name], NP  potassium chloride  (KLOR-CON ) 10 MEQ tablet Take 1 tablet (10 mEq total) by mouth daily 02/25/24 03/24/24  Nichols, Tonya S, NP  prochlorperazine  (COMPAZINE ) 10 MG tablet Take 0.5 tablets (5 mg total) by  mouth every 6 (six) hours as needed for refractory nausea / vomiting. 02/24/24   Pokhrel, Laxman, MD  prochlorperazine  (COMPAZINE ) 25 MG suppository Place 1 suppository (25 mg total) rectally every 12 (twelve) hours as needed for refractory nausea / vomiting. 02/24/24   Pokhrel, Laxman, MD  scopolamine  (TRANSDERM-SCOP) 1 MG/3DAYS Place 1 patch (1 mg total) onto the skin behind ear every 3 (three) days. 03/03/24   Pickenpack-Cousar, Athena N, NP  temazepam  (RESTORIL ) 15 MG capsule Take 1 capsule (15 mg total) by mouth at bedtime as needed for sleep. 02/12/24   Pickenpack-Cousar, Fannie SAILOR, NP  thiamine  (VITAMIN B-1) 100 MG tablet Take 1 tablet (100 mg total) by mouth daily. 03/03/24   Pickenpack-Cousar, Fannie SAILOR, NP   No Known Allergies Review of Systems + generalized pain.   Physical Exam Thin and weak appearing gentleman cachectic frail-appearing resting in bed Dry oral mucosa Loss of abdominal muscle and fat No edema lower extremities Awake alert Normal mood and affect  Vital Signs: BP 117/79 (BP Location: Left Arm)   Pulse (!) 51   Temp 98 F (36.7 C) (Oral)   Resp 20   Ht 5' 6 (1.676 m)   Wt 49.7 kg   SpO2 90%   BMI 17.67 kg/m  Pain Scale: 0-10   Pain Score: 8    SpO2: SpO2: 90 % O2 Device:SpO2: 90 % O2 Flow Rate: .   IO: Intake/output summary:  Intake/Output Summary (Last 24 hours) at 03/07/2024 1012 Last data filed at 03/07/2024 0612 Gross per 24 hour  Intake 856.38 ml  Output --  Net 856.38 ml    LBM:   Baseline Weight: Weight: 49.7 kg Most recent weight: Weight: 49.7 kg     Palliative Assessment/Data:   PPS 40%  Time In:  9 Time Out:  10.15 Time Total:  75 min  Greater than 50%  of this time was spent counseling and coordinating care related to the above assessment and plan.  Signed by: Lonia Serve, MD   Please contact Palliative Medicine Team phone at [phone] for questions and concerns.  For individual provider: See Tracey

## 2024-03-07 NOTE — TOC Progression Note (Addendum)
 Transition of Care [facility]) - Progression Note    Patient Details  Name: Jesus Haynes MRN: [id number] Date of Birth: 1975/08/01  Transition of Care [facility]) CM/SW Contact  [name], RN Phone Number: 03/07/2024, 1:49 PM  Clinical Narrative:    Received call from [name], Liaison for [facility]; she said pt has been accepted by agency, and medication pump will be delivered to hospice house today; she will come to hospital and place new pump; she also said agency's pharmacy will have medication ready today, and pt can admit to [facility] today; Dr [name] and Dr Sherrill notified via secure chat.  -1500- [name] Pack, [facility] for [facility] updated on pt disposition; she said hospice to hospice transfer from [facility] to [facility] has been done.  Expected Discharge Plan: Home w Hospice Care Barriers to Discharge: Continued Medical Work up               Expected Discharge Plan and Services   Discharge Planning Services: CM Consult   Living arrangements for the past 2 months: Apartment                 DME Arranged: N/A DME Agency: NA       HH Arranged: NA HH Agency: NA         Social Drivers of Health (SDOH) Interventions SDOH Screenings   Food Insecurity: No Food Insecurity (03/07/2024)  Housing: Low Risk  (03/07/2024)  Transportation Needs: No Transportation Needs (03/07/2024)  Utilities: Not At Risk (03/07/2024)  Depression (PHQ2-9): Low Risk  (01/30/2024)  Recent Concern: Depression (PHQ2-9) - Medium Risk (01/22/2024)  Social Connections: Unknown (07/30/2023)  Tobacco Use: Medium Risk (03/06/2024)    Readmission Risk Interventions    02/23/2024    2:29 PM 07/31/2023    3:41 PM  Readmission Risk Prevention Plan  Transportation Screening Complete Complete  Medication Review (RN Care Manager) Complete Complete  PCP or Specialist appointment within 3-5 days of discharge Complete   HRI or Home Care Consult Complete Complete  SW Recovery  Care/Counseling Consult Complete Complete  Palliative Care Screening Not Applicable Not Applicable  Skilled Nursing Facility Not Applicable Not Applicable

## 2024-03-07 NOTE — Plan of Care (Signed)
  Problem: Education: Goal: Knowledge of General Education information will improve Description: Including pain rating scale, medication(s)/side effects and non-pharmacologic comfort measures Outcome: Progressing   Problem: Health Behavior/Discharge Planning: Goal: Ability to manage health-related needs will improve Outcome: Progressing   Problem: Clinical Measurements: Goal: Will remain free from infection Outcome: Progressing Goal: Diagnostic test results will improve Outcome: Progressing Goal: Respiratory complications will improve Outcome: Progressing Goal: Cardiovascular complication will be avoided Outcome: Progressing   Problem: Coping: Goal: Level of anxiety will decrease Outcome: Progressing

## 2024-03-07 NOTE — Hospital Course (Addendum)
 HPI per Dr. Claretta Alderman on 03/06/24:  Jesus Haynes is a 48 y.o. 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 Per friend and POA, patient has had persistent nausea and vomiting after stopping chemotherapy 6 weeks ago. Patient is on methadone  and Dilaudid  for pain however patient unable to keep any of his pills down to control his pain. Patient endorsing severe intermitted abdominal pain, fatigue, persistent nausea and inability to tolerate p.o. intake, even liquids. He has attempted to drink protein shakes but unable to keep it down. Reports he weighed 230 lbs prior to his cancer diagnosis 10 months ago and currently down to 109 lbs due to inability to maintain his nutrition. They had a follow-up with his oncologist yesterday and with shared decision making, patient was transition to home hospice. Family would like to keep patient at home as long as possible however he needs other forms of delivering his pain medications and antiemetics.   Palliative care evaluated and transitioned and changed his pain regimen to a Dilaudid  continuous drip.  Diet has been advanced to a soft diet and he has been placed on scheduled antiemetics.  Hospice liaison has met with the patient and are arranging for home pump.    Assessment and Plan:  Intractable nausea and vomiting in the setting of  Metastatic gastric cancer Cancer-related Abdominal pain - Gastric adenocarcinoma initially diagnosed on 05/2023, started chemotherapy in January 2025 with FOLFOX plus nivolumab   - Due to progression of disease, patient switched to ramucirumab  plus paclitaxel  in July 2025 - chemotherapy discontinued 6 weeks ago due to side effects - Has had gradual decline and failure to thrive since then and due to  his persistent nausea with inability to keep any food or medication down, he was seen by his oncologist on 9/4 and transitioned to hospice. -Per family, they will prefer to keep patient on home hospice if possible; [facility] and plan is to transition Home with Hospice  -Palliative care consulted for symptomatic management and placed him on Continuous Dilaudid  gtt at 1 mg/hr - home pump has been arranged with hospice - tentative plan was home 9/7 but it's now reported multiple family members positive with covid; will need to re-discuss plan for home on 9/8 if still felt to be in patient's best interest vs consideration again of residential hospice   Hypokalemia: Mild and Improved. Will not check Labs anymore given transition to Hospice  Normocytic Anemia: continue comfort care  Thrombocytopenia:  -Will not check any more labs as patient is transitioning to Home Hopsice   Protein caloric malnutrition / Unintentional weight loss /  Failure to thrive: Estimated body mass index is 17.67 kg/m as calculated from the following:   Height as of this encounter: 5' 6 (1.676 m).   Weight as of this encounter: 49.7 kg. In the setting of metastatic disease.  Patient currently on hospice care  Hypoalbuminemia: continue comfort care

## 2024-03-07 NOTE — TOC Initial Note (Signed)
 Transition of Care [facility]) - Initial/Assessment Note    Patient Details  Name: Jesus Haynes MRN: [id number] Date of Birth: 1975-12-16  [facility]) CM/SW Contact:    [name], RN Phone Number: 03/07/2024, 10:52 AM  Clinical Narrative:                 [facility] consult for home hospice; spoke w/ pt in room; pt identifed POC Jesus Haynes (mother-in-law) [phone]; pt lives at home w/ the mother of his children; she will provide transportation; pt verified insurance/PCP; he denied SDOH risks; pt has walker, wheelchair, BSC, and shower chair; he does not have HH service, or home oxygen ; pt has been using [facility] for home hospice; he would like to change services to [facility]; pt said his mother in law can be contacted by hospice to set up services; LVM for pt's mother in law; pt will Dilaudid  infusion via CADD pump, using his port a cath per Dr [name]; spoke w/ [name] at [facility]; she will reach out to POC; agency contact info placed in follow up provider section of d/c instructions; TOC is following.  Expected Discharge Plan: Home w Hospice Care Barriers to Discharge: Continued Medical Work up   Patient Goals and CMS Choice Patient states their goals for this hospitalization and ongoing recovery are:: home          Expected Discharge Plan and Services   Discharge Planning Services: CM Consult   Living arrangements for the past 2 months: Apartment                 DME Arranged: N/A DME Agency: NA       HH Arranged: NA HH Agency: NA        Prior Living Arrangements/Services Living arrangements for the past 2 months: Apartment Lives with:: Roommate Patient language and need for interpreter reviewed:: Yes Do you feel safe going back to the place where you live?: Yes      Need for Family Participation in Patient Care: Yes (Comment) Care giver support system in place?: Yes (comment) Current home services: DME (walker, wheelchair, BSC,  shower chair) Criminal Activity/Legal Involvement Pertinent to Current Situation/Hospitalization: No - Comment as needed  Activities of Daily Living   ADL Screening (condition at time of admission) Independently performs ADLs?: Yes (appropriate for developmental age) Is the patient deaf or have difficulty hearing?: No Does the patient have difficulty seeing, even when wearing glasses/contacts?: No Does the patient have difficulty concentrating, remembering, or making decisions?: No  Permission Sought/Granted Permission sought to share information with : Case Manager Permission granted to share information with : No  Share Information with NAME: Case Manager     Permission granted to share info w Relationship: Jesus Haynes (mother-in-law) [phone]     Emotional Assessment Appearance:: Appears stated age Attitude/Demeanor/Rapport: Gracious Affect (typically observed): Accepting Orientation: : Oriented to Self, Oriented to Place, Oriented to  Time, Oriented to Situation Alcohol / Substance Use: Not Applicable Psych Involvement: No (comment)  Admission diagnosis:  Uncontrollable vomiting [R11.10] Intractable nausea and vomiting [R11.2] Patient Active Problem List   Diagnosis Date Noted   Malignant neoplasm of stomach (HCC) 03/07/2024   Metastatic malignant neoplasm (HCC) 03/07/2024   Abdominal pain 02/22/2024   Intractable nausea and vomiting 02/22/2024   Hypoglycemia 02/22/2024   Prolonged QT interval 02/22/2024   Mild intermittent asthma 02/22/2024   Anemia of chronic disease 02/22/2024   Intractable pain 02/22/2024   Pain 02/05/2024  Severe protein-calorie malnutrition (HCC) 02/05/2024   Hypokalemia 02/04/2024   Tobacco abuse 02/04/2024   Hypophosphatemia 02/04/2024   Dislodged jejunostomy tube 11/01/2023   Weight loss 11/01/2023   Fatigue 10/11/2023   Leukopenia due to antineoplastic chemotherapy (HCC) 09/18/2023   Encounter for antineoplastic chemotherapy  08/21/2023   Pedal edema 08/21/2023   Acute on chronic blood loss anemia 07/30/2023   Hyponatremia 07/30/2023   Hypocalcemia 07/30/2023   Port-A-Cath in place 07/25/2023   Feeding Jejunostomy tube in place 06/28/2023   Goals of care, counseling/discussion 06/24/2023   Constipation 06/22/2023   Palliative care encounter 06/21/2023   High risk medication use 06/21/2023   Medication management 06/21/2023   Malnutrition of moderate degree 06/18/2023   Failure to thrive in adult 06/14/2023   Cancer related pain 06/14/2023   Pre-syncope 06/14/2023   Asthma 06/14/2023   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 [place] [address] Phone: [phone] Fax: [phone]     Social Drivers of Health (SDOH) Social History: SDOH Screenings   Food Insecurity: No Food Insecurity (03/07/2024)  Housing: Low Risk  (03/07/2024)  Transportation Needs: No Transportation Needs (03/07/2024)  Utilities: Not At Risk (03/07/2024)  Depression (PHQ2-9): Low Risk  (01/30/2024)  Recent Concern: Depression (PHQ2-9) - Medium Risk (01/22/2024)  Social Connections: Unknown (07/30/2023)  Tobacco Use: Medium Risk (03/06/2024)   SDOH Interventions: Food Insecurity Interventions: Intervention Not Indicated, Inpatient TOC Housing Interventions: Intervention Not Indicated, Inpatient TOC Transportation Interventions: Intervention Not Indicated, Inpatient TOC Utilities Interventions: Intervention Not Indicated, Inpatient TOC   Readmission Risk Interventions    02/23/2024    2:29 PM 07/31/2023    3:41 PM  Readmission Risk Prevention Plan  Transportation Screening Complete Complete  Medication Review (RN Care Manager) Complete Complete  PCP or Specialist appointment within 3-5 days of discharge  Complete   HRI or Home Care Consult Complete Complete  SW Recovery Care/Counseling Consult Complete Complete  Palliative Care Screening Not Applicable Not Applicable  Skilled Nursing Facility Not Applicable Not Applicable

## 2024-03-07 NOTE — Discharge Summary (Signed)
 Physician Discharge Summary   Patient: Jesus Haynes MRN: [id number] DOB: April 04, 1976  Admit date:     03/06/2024  Discharge date: 03/07/24  Discharge Physician: Alejandro Marker, DO   PCP: [name], NP   Recommendations at discharge:   Follow-up care per Hospice protocol  Discharge Diagnoses: Principal Problem:   Intractable nausea and vomiting Active Problems:   Cancer related pain   Severe protein-calorie malnutrition (HCC)   Malignant neoplasm of stomach (HCC)   Metastatic malignant neoplasm (HCC)  Resolved Problems:   * No resolved hospital problems. [facility] Course: HPI per Dr. Claretta Alderman on 03/06/24:  Jesus Haynes is a 48 y.o. 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 Per friend and POA, patient has had persistent nausea and vomiting after stopping chemotherapy 6 weeks ago. Patient is on methadone  and Dilaudid  for pain however patient unable to keep any of his pills down to control his pain. Patient endorsing severe intermitted abdominal pain, fatigue, persistent nausea and inability to tolerate p.o. intake, even liquids. He has attempted to drink protein shakes but unable to keep it down. Reports he weighed 230 lbs prior to his cancer diagnosis 10 months ago and currently down to 109 lbs due to inability to maintain his nutrition. They had a follow-up with his oncologist yesterday and with shared decision making, patient was transition to home hospice. Family would like to keep patient at home as long as possible however he needs other forms of delivering his pain medications and antiemetics.   **Interim History: Palliative care evaluated and transitioned and changed his pain regimen to a Dilaudid  continuous drip.  Diet has been  advanced to a soft diet and he has been placed on scheduled antiemetics.  Hospice liaison has met with the patient and are arranging for home pump.  Given his improvement likely can be discharged in the a.m. however given his recent transition to hospice we will monitor him overnight to ensure that the current pain regimen is adequate for him in anticipation for discharge in the a.m.  As he remains medically stable at this time.  Assessment and Plan:  Intractable nausea and vomiting in the setting of  Metastatic gastric cancer / Cancer-related Abdominal pain - Gastric adenocarcinoma initially diagnosed on 05/2023, started chemotherapy in January 2025 with FOLFOX plus nivolumab   - Due to progression of disease, patient switched to ramucirumab  plus paclitaxel  in July 2025 - chemotherapy discontinued 6 weeks ago due to side effects - Has had gradual decline and failure to thrive since then and due to his persistent nausea with inability to keep any food or medication down, he was seen by his oncologist on 9/4 and transitioned to hospice. -Per family, they will prefer to keep patient on home hospice if possible; [facility] and plan is to transition Home with Hospice in the AM -Palliative care consulted for symptomatic management and placed him on Continuous Dilaudid  gtt at 0.5 mg/h as well as as needed hydromorphone  1 mg every 3 as needed for moderate pain -Continue IV NS 125 cc/hr while hospitalized; given a liter of fluid bolus -Continue on scopolamine  patch -C/w Antiemetic with Scheduled P.O./IV Ondansetron  4 mg every 8 hours and Phenergan suppository for refractory nausea or vomiting -Bowel regimen with bisacodyl  5 mg p.o. daily as  needed for moderate constipation and senna docusate 1 tab p.o. nightly as needed for mild constipation -Pain control as above but c/w acetaminophen  p.o./RC 650 mg mild pain or fever greater than 101, -Diet advanced to SOFT -Current plan is to  monitor him overnight on the Dilaudid  pump and current drips to ensure that his pain is adequately controlled and anticipate discharge in the a.m. D/w Palliative Care and Hospice Liaison   Hypokalemia: Mild and Improved. Will not check Labs anymore given transition to Hospice  Normocytic Anemia: Hgb/Hct Trend:  Recent Labs  Lab 02/22/24 0717 02/23/24 0704 02/24/24 0542 02/26/24 0931 03/06/24 2205 03/06/24 2206 03/07/24 0549  HGB 11.4* 11.0* 11.3* 11.5* 11.8* 12.2* 10.7*  HCT 36.8* 35.9* 36.1* 35.5* 37.4* 36.0* 35.6*  MCV 84.8 83.5 84.0 82.6 85.2  --  86.0  -Will not check any more labs as patient is transitioning to Home Hospice  Thrombocytopenia: Plt Count Trend: Recent Labs  Lab 02/21/24 2345 02/22/24 0717 02/23/24 0704 02/24/24 0542 02/26/24 0931 03/06/24 2205 03/07/24 0549  PLT 170 177 144* 131* 143* 159 148*  -Will not check any more labs as patient is transitioning to Home Hopsice   Protein caloric malnutrition / Unintentional weight loss /  Failure to thrive: Estimated body mass index is 17.67 kg/m as calculated from the following:   Height as of this encounter: 5' 6 (1.676 m).   Weight as of this encounter: 49.7 kg. In the setting of metastatic disease.  Patient currently on hospice care. IV fluid hydration as above.   Hypoalbuminemia: Patient's Albumin Trend: Recent Labs  Lab 02/12/24 1058 02/20/24 1255 02/21/24 2345 02/22/24 0717 02/23/24 0704 02/26/24 0931 03/06/24 2206  ALBUMIN 3.3* 2.4* 2.5* 2.3* 2.4* 2.9* 3.1*  -Continue to Monitor and Trend and repeat CMP in the AM        Consultants: Palliative Care; [facility]  Procedures performed: As delineated as above   Disposition: Hospice care  Diet recommendation:  Regular diet - SOFT DISCHARGE MEDICATION: Allergies as of 03/07/2024   No Known Allergies      Medication List     STOP taking these medications    HYDROmorphone  2 MG tablet Commonly known as: Dilaudid     methadone  5 MG tablet Commonly known as: DOLOPHINE    ondansetron  4 MG disintegrating tablet Commonly known as: ZOFRAN -ODT       TAKE these medications    albuterol  (2.5 MG/3ML) 0.083% nebulizer solution Commonly known as: PROVENTIL  Take 3 mLs (2.5 mg total) by nebulization every 6 (six) hours as needed for wheezing or shortness of breath.   Ventolin  HFA 108 (90 Base) MCG/ACT inhaler Generic drug: albuterol  Inhale 1-2 puffs into the lungs every 4 (four) hours as needed for wheezing or shortness of breath.   B-12 1000 MCG Tabs Take 1 tablet (1,000 mcg total) by mouth daily.   bisacodyl  5 MG EC tablet Commonly known as: DULCOLAX Take 1 tablet (5 mg total) by mouth daily as needed for moderate constipation.   dexamethasone  2 MG tablet Commonly known as: DECADRON  Take 2 tablets by mouth twice daily for 3 days, then take 2 tablets by mouth once a day for 3 days, then take 1 tablet by mouth once a day for 2 days.   docusate sodium  100 MG capsule Commonly known as: COLACE Take 1 capsule (100 mg total) by mouth daily.   DULoxetine  20 MG capsule Commonly known as: CYMBALTA  Take 1 capsule (20 mg total) by mouth daily.   folic acid   1 MG tablet Commonly known as: FOLVITE  Take 1 tablet (1 mg total) by mouth daily.   HYDROmorphone  HCl-NaCl 50MG ANTONIA Soln Commonly known as: DILAUDID  Inject 0.5 mg/hr into the vein continuous.   ondansetron  4 MG tablet Commonly known as: ZOFRAN  Take 1 tablet (4 mg total) by mouth every 8 (eight) hours.   pantoprazole  40 MG tablet Commonly known as: Protonix  Take 1 tablet (40 mg total) by mouth 2 (two) times daily.   potassium chloride  10 MEQ tablet Commonly known as: KLOR-CON  Take 1 tablet (10 mEq total) by mouth daily   prochlorperazine  10 MG tablet Commonly known as: COMPAZINE  Take 0.5 tablets (5 mg total) by mouth every 6 (six) hours as needed for refractory nausea / vomiting.   prochlorperazine  25 MG suppository Commonly known as:  COMPAZINE  Place 1 suppository (25 mg total) rectally every 12 (twelve) hours as needed for refractory nausea / vomiting.   scopolamine  1 MG/3DAYS Commonly known as: TRANSDERM-SCOP Place 1 patch (1 mg total) onto the skin behind ear every 3 (three) days.   senna-docusate 8.6-50 MG tablet Commonly known as: Senokot-S Take 1 tablet by mouth at bedtime as needed for mild constipation.   sodium chloride  flush 0.9 % Soln Commonly known as: NS 10-40 mLs by Intracatheter route as needed (flush).   temazepam  15 MG capsule Commonly known as: RESTORIL  Take 1 capsule (15 mg total) by mouth at bedtime as needed for sleep.   thiamine  100 MG tablet Commonly known as: VITAMIN B1 Take 1 tablet (100 mg total) by mouth daily.        Follow-up Information     [facility] up.   Contact information: [address] [place] [address] [phone]                Discharge Exam: Jesus Haynes   03/07/24 0044  Weight: 49.7 kg   Vitals:   03/07/24 1143 03/07/24 1819  BP: 121/79 (!) 137/93  Pulse: 60 72  Resp: 18 20  Temp: 98.2 F (36.8 C) 98.4 F (36.9 C)  SpO2: 93% 91%   Examination: Physical Exam:  Constitutional: Thin chronically ill-appearing Caucasian male who appears calm and has some mild pain Respiratory: Diminished to auscultation bilaterally, no wheezing, rales, rhonchi or crackles. Normal respiratory effort and patient is not tachypenic. No accessory muscle use. Unlabored breathing  Cardiovascular: RRR, no murmurs / rubs / gallops. S1 and S2 auscultated. No extremity edema.   Abdomen: Soft, a little-tender, non-distended. Bowel sounds positive.  GU: Deferred. Musculoskeletal: No clubbing / cyanosis of digits/nails. No joint deformity upper and lower extremities.  Skin: No rashes, lesions, ulcers on a limited skin evaluation. No induration; Warm and dry.  Neurologic: CN 2-12 grossly intact with no focal deficits. Romberg sign and cerebellar  reflexes not assessed.  Psychiatric: Normal judgment and insight. Alert and oriented x 3. Normal mood and appropriate affect.   Condition at discharge: stable  The results of significant diagnostics from this hospitalization (including imaging, microbiology, ancillary and laboratory) are listed below for reference.   Imaging Studies: DG Chest Portable 1 View Result Date: 02/21/2024 CLINICAL DATA:  chest pain Hx Gastric adenocarcinoma , last chemo two weeks ago EXAM: PORTABLE CHEST 1 VIEW COMPARISON:  Chest x-ray 02/02/2024, PET CT 02/03/2024, CT chest 02/03/2024 FINDINGS: Right chest wall Port-A-Cath with tip overlying the expected region of the superior caval junction. The heart and mediastinal contours are unchanged. Persistent prominent hilar regions consistent with known underlying lymphadenopathy. No focal consolidation.  No pulmonary edema. No pleural effusion. No pneumothorax. No acute osseous abnormality. IMPRESSION: 1. No acute cardiopulmonary abnormality. 2. Persistent prominent hilar regions consistent with known underlying lymphadenopathy. Finding better evaluated on PET CT 12/30/2023 and CT angio chest 02/03/2024. Electronically Signed   By: Morgane  Naveau M.D.   On: 02/21/2024 22:32   CT ABDOMEN PELVIS W CONTRAST Result Date: 02/20/2024 CLINICAL DATA:  Concern for bowel obstruction. History of adenocarcinoma of the GE junction. EXAM: CT ABDOMEN AND PELVIS WITH CONTRAST TECHNIQUE: Multidetector CT imaging of the abdomen and pelvis was performed using the standard protocol following bolus administration of intravenous contrast. RADIATION DOSE REDUCTION: This exam was performed according to the departmental dose-optimization program which includes automated exposure control, adjustment of the mA and/or kV according to patient size and/or use of iterative reconstruction technique. CONTRAST:  OMNIPAQUE  IOHEXOL  300 MG/ML  SOLN COMPARISON:  CT abdomen pelvis dated 02/03/2024. FINDINGS: Lower  chest: Small bilateral pleural effusions. There is partial compressive atelectasis of the left lung base. No intra-abdominal free air. There is diffuse mesenteric edema and small ascites. Hepatobiliary: Small liver cysts. No biliary ductal dilatation. The gallbladder is unremarkable. Pancreas: The pancreas is atrophic for patient's age which may be sequela of chronic pancreatitis. Correlation with pancreatic enzymes recommended to exclude the possibility of acute on chronic pancreatitis. No dilatation of the main pancreatic duct. Spleen: Normal in size without focal abnormality. Adrenals/Urinary Tract: The adrenal glands unremarkable. There is no hydronephrosis on either side. The urinary bladder is grossly unremarkable. Stomach/Bowel: There is diffuse thickening of the gastric wall which may be related to ascites or represent gastritis. There is no bowel obstruction. The appendix is normal. Vascular/Lymphatic: The abdominal aorta and IVC unremarkable. No portal venous gas. Retroperitoneal adenopathy measure up to 16 mm in short axis to the left of the aorta at the level of the renal vasculature concerning for metastatic adenopathy. The left para-aortic adenopathy demonstrate central low attenuation which may represent necrotic changes. These are relatively similar in size to the prior CT. Reproductive: The prostate and seminal vesicles are grossly unremarkable. Other: Diffuse subcutaneous edema and anasarca. Musculoskeletal: No acute osseous pathology. IMPRESSION: 1. No bowel obstruction. Normal appendix. 2. Diffuse mesenteric edema, anasarca, and small ascites. 3. Small bilateral pleural effusions. 4. Retroperitoneal adenopathy consistent with metastatic disease. 5. Diffuse thickening of the gastric wall may be related to ascites or represent gastritis. An infiltrative process is not excluded. 6. Atrophic pancreas for patient's age and possibly secondary to chronic pancreatitis. Correlation with pancreatic enzymes  recommended to exclude the possibility of acute on chronic pancreatitis. Electronically Signed   By: Vanetta Chou M.D.   On: 02/20/2024 18:21   Microbiology: Results for orders placed or performed during the hospital encounter of 07/25/20  Group A Strep by PCR     Status: None   Collection Time: 07/25/20 11:32 AM   Specimen: Throat; Sterile Swab  Result Value Ref Range Status   Group A Strep by PCR NOT DETECTED NOT DETECTED Final    Comment: Performed at [facility], [address] [place], [place] [address]   Labs: CBC: Recent Labs  Lab 03/06/24 2205 03/06/24 2206 03/07/24 0549  WBC 6.1  --  4.3  NEUTROABS 4.9  --   --   HGB 11.8* 12.2* 10.7*  HCT 37.4* 36.0* 35.6*  MCV 85.2  --  86.0  PLT 159  --  148*   Basic Metabolic Panel: Recent Labs  Lab 03/06/24 2206 03/07/24 0549  NA 135  135 136  K 3.6  3.5 3.6  CL 100  98 101  CO2 26 25  GLUCOSE 82  80 71  BUN 12  11 11   CREATININE 0.43*  0.50* 0.41*  CALCIUM  8.7* 8.2*   Liver Function Tests: Recent Labs  Lab 03/06/24 2206  AST 17  ALT 8  ALKPHOS 55  BILITOT 0.4  PROT 5.2*  ALBUMIN 3.1*   CBG: No results for input(s): GLUCAP in the last 168 hours.  Discharge time spent: greater than 30 minutes.  Signed: Alejandro Marker, DO [facility] 03/07/2024

## 2024-03-07 NOTE — Plan of Care (Signed)

## 2024-03-08 ENCOUNTER — Other Ambulatory Visit (HOSPITAL_COMMUNITY): Payer: Self-pay

## 2024-03-08 ENCOUNTER — Encounter: Payer: Self-pay | Admitting: Oncology

## 2024-03-08 DIAGNOSIS — R112 Nausea with vomiting, unspecified: Secondary | ICD-10-CM | POA: Diagnosis not present

## 2024-03-08 DIAGNOSIS — G893 Neoplasm related pain (acute) (chronic): Secondary | ICD-10-CM | POA: Diagnosis not present

## 2024-03-08 MED ORDER — HYDROMORPHONE HCL-NACL 50-0.9 MG/50ML-% IV SOLN: 1.0000 mg/h | INTRAVENOUS | Status: AC

## 2024-03-08 MED ORDER — ENSURE PLUS HIGH PROTEIN PO LIQD: 237.0000 mL | Freq: Two times a day (BID) | ORAL | Status: AC

## 2024-03-08 NOTE — TOC Progression Note (Addendum)
 Transition of Care [facility]) - Progression Note    Patient Details  Name: Jesus Haynes MRN: [id number] Date of Birth: 1975/07/28  [facility] CM/SW Contact  [name], RN Phone Number: 03/08/2024, 10:55 AM  Clinical Narrative:    Notified by Dr [name] pt medically ready for d/c; spoke w/ [name] at [facility]; she said she met w/ pt yesterday to connect CADD, and pt said he was not ready to go home; he requested [name] follow up w/ him early Sunday afternoon; [name] said pump and meds are ready for pt; Drs [name] and [name] notified via secure chat.  -1216- notified pt has family members that tested + COVID; Dr [name] requested HOP speak w/ pt regarding d/c to residential hospice; spoke w. [name]; she said no bed available at [facility], and she is not sure if bed is available at [facility] facility; she also said pt has been reluctant to d/c to residential hospice; Drs [name] and [name] notified via secure chat.  -1221- notified d/c on hold; [name] notified; TOC is following.   Expected Discharge Plan: Home w Hospice Care Barriers to Discharge: Continued Medical Work up               Expected Discharge Plan and Services   Discharge Planning Services: CM Consult   Living arrangements for the past 2 months: Apartment                 DME Arranged: N/A DME Agency: NA       HH Arranged: NA HH Agency: NA         Social Drivers of Health (SDOH) Interventions SDOH Screenings   Food Insecurity: No Food Insecurity (03/07/2024)  Housing: Low Risk  (03/07/2024)  Transportation Needs: No Transportation Needs (03/07/2024)  Utilities: Not At Risk (03/07/2024)  Depression (PHQ2-9): Low Risk  (01/30/2024)  Recent Concern: Depression (PHQ2-9) - Medium Risk (01/22/2024)  Social Connections: Unknown (07/30/2023)  Tobacco Use: Medium Risk (03/06/2024)    Readmission Risk Interventions    02/23/2024    2:29 PM 07/31/2023    3:41 PM   Readmission Risk Prevention Plan  Transportation Screening Complete Complete  Medication Review (RN Care Manager) Complete Complete  PCP or Specialist appointment within 3-5 days of discharge Complete   HRI or Home Care Consult Complete Complete  SW Recovery Care/Counseling Consult Complete Complete  Palliative Care Screening Not Applicable Not Applicable  Skilled Nursing Facility Not Applicable Not Applicable

## 2024-03-08 NOTE — Plan of Care (Signed)

## 2024-03-08 NOTE — Progress Notes (Signed)
 This RN notified by patient that 3 of 6 people living at home of planned discharge have tested positive for Covid-19. [name], MD & [name], MD notified.

## 2024-03-08 NOTE — Progress Notes (Signed)
 Daily Progress Note   Patient Name: Jesus Haynes       Date: 03/08/2024 DOB: 1976-03-18  Age: 48 y.o. MRN#: [id number] Attending Physician: [name] Lenis, MD Primary Care Physician: [name], NP Admit Date: 03/06/2024  Reason for Consultation/Follow-up: Terminal Care  Subjective: Dilaudid  infusion now at 1 mg/hour since earlier this am, awake alert, appears pale and with generalized weakness, pain better controlled, awaiting discharge home with hospice.   Length of Stay: 1  Current Medications: Scheduled Meds:   Chlorhexidine  Gluconate Cloth  6 each Topical Daily   feeding supplement  237 mL Oral BID BM   ondansetron   4 mg Oral Q8H   Or   ondansetron  (ZOFRAN ) IV  4 mg Intravenous Q8H   sodium chloride  flush  10-40 mL Intracatheter Q12H    Continuous Infusions:  sodium chloride  125 mL/hr at 03/08/24 0720   HYDROmorphone  1 mg/hr (03/08/24 0714)    PRN Meds: acetaminophen  **OR** acetaminophen , bisacodyl , HYDROmorphone  (DILAUDID ) injection, prochlorperazine , senna-docusate, sodium chloride  flush  Physical Exam         Pale Generalized weakness Cancer related cachexia evident No edema Has port a cath  Vital Signs: BP 135/82 (BP Location: Left Arm)   Pulse (!) 56   Temp 98 F (36.7 C) (Oral)   Resp 16   Ht 5' 6 (1.676 m)   Wt 49.7 kg   SpO2 96%   BMI 17.67 kg/m  SpO2: SpO2: 96 % O2 Device: O2 Device: Room Air O2 Flow Rate:    Intake/output summary:  Intake/Output Summary (Last 24 hours) at 03/08/2024 1005 Last data filed at 03/08/2024 0720 Gross per 24 hour  Intake 3264.68 ml  Output --  Net 3264.68 ml   LBM:   Baseline Weight: Weight: 49.7 kg Most recent weight: Weight: 49.7 kg       Palliative Assessment/Data:      Patient Active Problem List    Diagnosis Date Noted   Malignant neoplasm of stomach (HCC) 03/07/2024   Metastatic malignant neoplasm (HCC) 03/07/2024   Abdominal pain 02/22/2024   Intractable nausea and vomiting 02/22/2024   Hypoglycemia 02/22/2024   Prolonged QT interval 02/22/2024   Mild intermittent asthma 02/22/2024   Anemia of chronic disease 02/22/2024   Intractable pain 02/22/2024   Pain 02/05/2024   Severe protein-calorie malnutrition (HCC) 02/05/2024  Hypokalemia 02/04/2024   Tobacco abuse 02/04/2024   Hypophosphatemia 02/04/2024   Dislodged jejunostomy tube 11/01/2023   Weight loss 11/01/2023   Fatigue 10/11/2023   Leukopenia due to antineoplastic chemotherapy (HCC) 09/18/2023   Encounter for antineoplastic chemotherapy 08/21/2023   Pedal edema 08/21/2023   Acute on chronic blood loss anemia 07/30/2023   Hyponatremia 07/30/2023   Hypocalcemia 07/30/2023   Port-A-Cath in place 07/25/2023   Feeding Jejunostomy tube in place 06/28/2023   Goals of care, counseling/discussion 06/24/2023   Constipation 06/22/2023   Palliative care encounter 06/21/2023   High risk medication use 06/21/2023   Medication management 06/21/2023   Malnutrition of moderate degree 06/18/2023   Failure to thrive in adult 06/14/2023   Cancer related pain 06/14/2023   Pre-syncope 06/14/2023   Asthma 06/14/2023   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 Autumn from medical oncology, life-limiting illness of metastatic gastric adenocarcinoma, recently stopped chemotherapy, history of mild intermittent asthma and cancer related pain, anemia of chronic disease Patient admitted to hospital medicine service as he came into the emergency department for evaluation of persistent nausea vomiting failure to thrive  and not able to keep anything down PPS 30%   Recommendations/Plan:  Home with hospice Dilaudid  1 mg / hour infusion through port, appreciate hospice setting up CADD pump.   Goals of Care and Additional Recommendations: Limitations on Scope of Treatment: Full Comfort Care  Code Status:    Code Status Orders  (From admission, onward)           Start     Ordered   03/07/24 1009  Do not attempt resuscitation (DNR) - Comfort care  (Code Status)  Continuous       Question Answer Comment  If patient has no pulse and is not breathing Do Not Attempt Resuscitation   In Pre-Arrest Conditions (Patient Is Breathing and Has a Pulse) Provide comfort measures. Relieve any mechanical airway obstruction. Avoid transfer unless required for comfort.   Consent: Discussion documented in EHR or advanced directives reviewed      03/07/24 1009           Code Status History     Date Active Date Inactive Code Status Order ID Comments User Context   03/06/2024 2311 03/07/2024 1009 Limited: Do not attempt resuscitation (DNR) -DNR-LIMITED -Do Not Intubate/DNI  [id number]  Lou Claretta HERO, MD ED   02/22/2024 0203 02/24/2024 1635 Limited: Do not attempt resuscitation (DNR) -DNR-LIMITED -Do Not Intubate/DNI  [id number]  Marcene Eva NOVAK, DO ED   02/04/2024 2248 02/06/2024 2034 Limited: Do not attempt resuscitation (DNR) -DNR-LIMITED -Do Not Intubate/DNI  [id number]  Silvester Ales, MD ED   07/30/2023 1205 08/02/2023 1645 Full Code [id number]  Celinda Alm Lot, MD ED   06/14/2023 1621 07/11/2023 2225 Full Code [id number]  Celinda Alm Lot, MD Inpatient   06/13/2023 1253 06/14/2023 0509 Full Code [id number]  Philip Cornet, MD HOV   05/27/2023 1516 05/30/2023 1651 Full Code [id number]  Zella Katha HERO, MD ED      Advance Directive Documentation    Flowsheet Row Most Recent Value  Type of Advance Directive Healthcare Power of Attorney, Living will  Pre-existing out of facility DNR order (yellow form or pink  MOST form) --  MOST Form in Place? --  Prognosis:  < 4 weeks  Discharge Planning: Home with Hospice  Care plan was discussed with patient, RN  Thank you for allowing the Palliative Medicine Team to assist in the care of this patient.  Mod MDM     Greater than 50%  of this time was spent counseling and coordinating care related to the above assessment and plan.  Lonia Serve, MD  Please contact Palliative Medicine Team phone at [phone] for questions and concerns.

## 2024-03-08 NOTE — Progress Notes (Signed)
 Progress Note    ABSHIR PAOLINI   FMW:[id number]  DOB: [birth year]  DOA: 03/06/2024     1 PCP: [name], NP  Initial CC: N/V  Hospital Course: HPI per Dr. Claretta Alderman on 03/06/24:  Jesus Haynes is a 48 y.o. 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 Per friend and POA, patient has had persistent nausea and vomiting after stopping chemotherapy 6 weeks ago. Patient is on methadone  and Dilaudid  for pain however patient unable to keep any of his pills down to control his pain. Patient endorsing severe intermitted abdominal pain, fatigue, persistent nausea and inability to tolerate p.o. intake, even liquids. He has attempted to drink protein shakes but unable to keep it down. Reports he weighed 230 lbs prior to his cancer diagnosis 10 months ago and currently down to 109 lbs due to inability to maintain his nutrition. They had a follow-up with his oncologist yesterday and with shared decision making, patient was transition to home hospice. Family would like to keep patient at home as long as possible however he needs other forms of delivering his pain medications and antiemetics.   Palliative care evaluated and transitioned and changed his pain regimen to a Dilaudid  continuous drip.  Diet has been advanced to a soft diet and he has been placed on scheduled antiemetics.  Hospice liaison has met with the patient and are arranging for home pump.    Assessment and Plan:  Intractable nausea and vomiting in the setting of  Metastatic gastric cancer Cancer-related Abdominal pain - Gastric adenocarcinoma initially diagnosed on 05/2023, started chemotherapy in January 2025 with FOLFOX plus nivolumab   - Due to progression of disease, patient switched to ramucirumab   plus paclitaxel  in July 2025 - chemotherapy discontinued 6 weeks ago due to side effects - Has had gradual decline and failure to thrive since then and due to his persistent nausea with inability to keep any food or medication down, he was seen by his oncologist on 9/4 and transitioned to hospice. -Per family, they will prefer to keep patient on home hospice if possible; [facility] and plan is to transition Home with Hospice  -Palliative care consulted for symptomatic management and placed him on Continuous Dilaudid  gtt at 1 mg/hr - home pump has been arranged with hospice - tentative plan was home 9/7 but it's now reported multiple family members positive with covid; will need to re-discuss plan for home on 9/8 if still felt to be in patient's best interest vs consideration again of residential hospice   Hypokalemia: Mild and Improved. Will not check Labs anymore given transition to Hospice  Normocytic Anemia: continue comfort care  Thrombocytopenia:  -Will not check any more labs as patient is transitioning to Home Hopsice   Protein caloric malnutrition / Unintentional weight loss /  Failure to thrive: Estimated body mass index is 17.67 kg/m as calculated from the following:   Height as of this encounter: 5' 6 (1.676 m).   Weight as of this encounter: 49.7 kg. In the setting of metastatic disease.  Patient currently on hospice care  Hypoalbuminemia: continue comfort care   Interval History:  No events overnight.  Tentative plan was for discharge home today but now multiple people in the household are reported to have COVID.  Old records reviewed  in assessment of this patient  Antimicrobials:   DVT prophylaxis:  SCDs Start: 03/06/24 2312   Code Status:   Code Status: Do not attempt resuscitation (DNR) - Comfort care  Mobility Assessment (Last 72 Hours)     Mobility Assessment     Row Name 03/08/24 0932 03/07/24 2011 03/07/24 0758 03/06/24 2300      Does the patient have exclusion criteria? No - Perform mobility assessment No - Perform mobility assessment No - Perform mobility assessment No - Perform mobility assessment    What is the highest level of mobility based on the mobility assessment? Level 4 (Ambulates with assistance) - Balance while stepping forward/back - Complete Level 4 (Ambulates with assistance) - Balance while stepping forward/back - Complete Level 4 (Ambulates with assistance) - Balance while stepping forward/back - Complete Level 4 (Ambulates with assistance) - Balance while stepping forward/back - Complete       Barriers to discharge:  Disposition Plan:  TBD Status is: Inpt  Objective: Blood pressure (!) 136/91, pulse 61, temperature 97.8 F (36.6 C), temperature source Oral, resp. rate 18, height 5' 6 (1.676 m), weight 49.7 kg, SpO2 99%.  Examination:  Physical Exam Constitutional:      General: He is not in acute distress.    Appearance: Normal appearance.  HENT:     Head: Normocephalic and atraumatic.     Mouth/Throat:     Mouth: Mucous membranes are moist.  Eyes:     Extraocular Movements: Extraocular movements intact.  Cardiovascular:     Rate and Rhythm: Normal rate and regular rhythm.  Pulmonary:     Effort: Pulmonary effort is normal. No respiratory distress.     Breath sounds: Normal breath sounds. No wheezing.  Abdominal:     General: Bowel sounds are normal. There is no distension.     Palpations: Abdomen is soft.     Tenderness: There is no abdominal tenderness.  Musculoskeletal:        General: Normal range of motion.     Cervical back: Normal range of motion and neck supple.  Skin:    General: Skin is warm and dry.  Neurological:     General: No focal deficit present.     Mental Status: He is alert.  Psychiatric:        Mood and Affect: Mood normal.        Behavior: Behavior normal.      Consultants:  Palliative care   Procedures:    Data Reviewed: No results found for this  or any previous visit (from the past 24 hours).  I have reviewed pertinent nursing notes, vitals, labs, and images as necessary. I have ordered labwork to follow up on as indicated.  I have reviewed the last notes from staff over past 24 hours. I have discussed patient's care plan and test results with nursing staff, CM/SW, and other staff as appropriate.  Time spent: Greater than 50% of the 55 minute visit was spent in counseling/coordination of care for the patient as laid out in the A&P.   LOS: 1 day   Alm Apo, MD [facility] 03/08/2024, 3:14 PM

## 2024-03-09 ENCOUNTER — Other Ambulatory Visit (HOSPITAL_COMMUNITY): Payer: Self-pay

## 2024-03-09 DIAGNOSIS — R112 Nausea with vomiting, unspecified: Secondary | ICD-10-CM | POA: Diagnosis not present

## 2024-03-09 NOTE — Progress Notes (Signed)
 Daily Progress Note   Patient Name: Jesus Haynes       Date: 03/09/2024 DOB: [birth year]  Age: 48 y.o. MRN#: [id number] Attending Physician: Leotis Bogus, MD Primary Care Physician: [name], NP Admit Date: 03/06/2024  Reason for Consultation/Follow-up: Terminal Care  Subjective: Dilaudid  infusion now at 1 mg/hour since earlier this am, awake alert, appears pale and with generalized weakness, pain better controlled, however, patient still has episodic discomfort, several family members now with COVID that has delayed his discharge, discussed with patient about considering respite care at hospice until home family members are out of their contagious period.   Discussed with RN TOC and patient. No family at bedside at time of visit.    Length of Stay: 2  Current Medications: Scheduled Meds:   Chlorhexidine  Gluconate Cloth  6 each Topical Daily   feeding supplement  237 mL Oral BID BM   ondansetron   4 mg Oral Q8H   Or   ondansetron  (ZOFRAN ) IV  4 mg Intravenous Q8H   sodium chloride  flush  10-40 mL Intracatheter Q12H    Continuous Infusions:  HYDROmorphone  1 mg/hr (03/08/24 2249)    PRN Meds: acetaminophen  **OR** acetaminophen , bisacodyl , HYDROmorphone  (DILAUDID ) injection, prochlorperazine , senna-docusate, sodium chloride  flush  Physical Exam         Pale Generalized weakness Cancer related cachexia evident No edema Has port a cath  Vital Signs: BP 112/82 (BP Location: Left Arm)   Pulse (!) 58   Temp 97.7 F (36.5 C) (Oral)   Resp 14   Ht 5' 6 (1.676 m)   Wt 49.7 kg   SpO2 99%   BMI 17.67 kg/m  SpO2: SpO2: 99 % O2 Device: O2 Device: Room Air O2 Flow Rate:    Intake/output summary:  Intake/Output Summary (Last 24 hours) at 03/09/2024 1102 Last data filed  at 03/08/2024 2249 Gross per 24 hour  Intake 1315.49 ml  Output --  Net 1315.49 ml   LBM:   Baseline Weight: Weight: 49.7 kg Most recent weight: Weight: 49.7 kg       Palliative Assessment/Data:      Patient Active Problem List   Diagnosis Date Noted   Malignant neoplasm of stomach (HCC) 03/07/2024   Metastatic malignant neoplasm (HCC) 03/07/2024   Abdominal pain 02/22/2024   Intractable nausea and vomiting 02/22/2024  Hypoglycemia 02/22/2024   Prolonged QT interval 02/22/2024   Mild intermittent asthma 02/22/2024   Anemia of chronic disease 02/22/2024   Intractable pain 02/22/2024   Pain 02/05/2024   Severe protein-calorie malnutrition (HCC) 02/05/2024   Hypokalemia 02/04/2024   Tobacco abuse 02/04/2024   Hypophosphatemia 02/04/2024   Dislodged jejunostomy tube 11/01/2023   Weight loss 11/01/2023   Fatigue 10/11/2023   Leukopenia due to antineoplastic chemotherapy (HCC) 09/18/2023   Encounter for antineoplastic chemotherapy 08/21/2023   Pedal edema 08/21/2023   Acute on chronic blood loss anemia 07/30/2023   Hyponatremia 07/30/2023   Hypocalcemia 07/30/2023   Port-A-Cath in place 07/25/2023   Feeding Jejunostomy tube in place 06/28/2023   Goals of care, counseling/discussion 06/24/2023   Constipation 06/22/2023   Palliative care encounter 06/21/2023   High risk medication use 06/21/2023   Medication management 06/21/2023   Malnutrition of moderate degree 06/18/2023   Failure to thrive in adult 06/14/2023   Cancer related pain 06/14/2023   Pre-syncope 06/14/2023   Asthma 06/14/2023   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 Autumn from medical oncology, life-limiting illness of metastatic gastric adenocarcinoma, recently stopped  chemotherapy, history of mild intermittent asthma and cancer related pain, anemia of chronic disease Patient admitted to hospital medicine service as he came into the emergency department for evaluation of persistent nausea vomiting failure to thrive and not able to keep anything down PPS 30%   Recommendations/Plan: Consider respite care at hospice facility. Discussed with TOC.  Dilaudid  1.3 mg / hour infusion through port, appreciate hospice setting up CADD pump.   Goals of Care and Additional Recommendations: Limitations on Scope of Treatment: Full Comfort Care  Code Status:    Code Status Orders  (From admission, onward)           Start     Ordered   03/07/24 1009  Do not attempt resuscitation (DNR) - Comfort care  (Code Status)  Continuous       Question Answer Comment  If patient has no pulse and is not breathing Do Not Attempt Resuscitation   In Pre-Arrest Conditions (Patient Is Breathing and Has a Pulse) Provide comfort measures. Relieve any mechanical airway obstruction. Avoid transfer unless required for comfort.   Consent: Discussion documented in EHR or advanced directives reviewed      03/07/24 1009           Code Status History     Date Active Date Inactive Code Status Order ID Comments User Context   03/06/2024 2311 03/07/2024 1009 Limited: Do not attempt resuscitation (DNR) -DNR-LIMITED -Do Not Intubate/DNI  [id number]  Lou Claretta HERO, MD ED   02/22/2024 0203 02/24/2024 1635 Limited: Do not attempt resuscitation (DNR) -DNR-LIMITED -Do Not Intubate/DNI  [id number]  Marcene Eva NOVAK, DO ED   02/04/2024 2248 02/06/2024 2034 Limited: Do not attempt resuscitation (DNR) -DNR-LIMITED -Do Not Intubate/DNI  [id number]  Silvester Ales, MD ED   07/30/2023 1205 08/02/2023 1645 Full Code [id number]  Celinda Alm Lot, MD ED   06/14/2023 1621 07/11/2023 2225 Full Code [id number]  Celinda Alm Lot, MD Inpatient   06/13/2023 1253 06/14/2023 0509 Full Code [id number]  Philip Cornet, MD HOV   05/27/2023 1516 05/30/2023 1651 Full Code [id number]  Zella Katha HERO, MD ED  Advance Directive Documentation    Flowsheet Row Most Recent Value  Type of Advance Directive Healthcare Power of Attorney, Living will  Pre-existing out of facility DNR order (yellow form or pink MOST form) --  MOST Form in Place? --    Prognosis:  < 4 weeks  Discharge Planning: Home with Hospice Or hospice facility for respite initially.  Care plan was discussed with patient, RN [facility] Thank you for allowing the Palliative Medicine Team to assist in the care of this patient.  Mod MDM     Greater than 50%  of this time was spent counseling and coordinating care related to the above assessment and plan.  Lonia Serve, MD  Please contact Palliative Medicine Team phone at [phone] for questions and concerns.

## 2024-03-09 NOTE — TOC Progression Note (Addendum)
 Transition of Care [facility]) - Progression Note    Patient Details  Name: Jesus Haynes MRN: [id number] Date of Birth: Nov 13, 1975  Transition of Care [facility]) CM/SW Contact  [name], RN Phone Number:[phone]  03/09/2024, 10:56 AM  Clinical Narrative:    Cm spoke with Dr. Woodroe who states that patient is open to respite care for 5 days at hospice due to several family members at home with Covid. Cm has reached out to [facility] for [facility] [place]. Per Jon patient can not be admitted as respite but can be admitted as GIP and can discharge home once patient is feeling better. CM at bedside to discuss with patient. Patient is currently in the restroom. Cm has waiting. Will return later to discuss with patient.   1206 Cm at bedside to explain options to patient. Patient states that he does not want to go to residential hospice and would like to stay in the hospital until he goes home. CM has made patient aware that there is no medical necessity for continues stay in the hospital. Patient is agreeable to go home where he states that he can quarantine. CM has advised patient that MD plans to d/c in am. Patient verbalized understanding.    Expected Discharge Plan: Home w Hospice Care Barriers to Discharge: Continued Medical Work up               Expected Discharge Plan and Services   Discharge Planning Services: CM Consult   Living arrangements for the past 2 months: Apartment Expected Discharge Date: 03/08/24               DME Arranged: N/A DME Agency: NA       HH Arranged: NA HH Agency: NA         Social Drivers of Health (SDOH) Interventions SDOH Screenings   Food Insecurity: No Food Insecurity (03/07/2024)  Housing: Low Risk  (03/07/2024)  Transportation Needs: No Transportation Needs (03/07/2024)  Utilities: Not At Risk (03/07/2024)  Depression (PHQ2-9): Low Risk  (01/30/2024)  Recent Concern: Depression (PHQ2-9) - Medium Risk (01/22/2024)  Social  Connections: Unknown (07/30/2023)  Tobacco Use: Medium Risk (03/06/2024)    Readmission Risk Interventions    02/23/2024    2:29 PM 07/31/2023    3:41 PM  Readmission Risk Prevention Plan  Transportation Screening Complete Complete  Medication Review (RN Care Manager) Complete Complete  PCP or Specialist appointment within 3-5 days of discharge Complete   HRI or Home Care Consult Complete Complete  SW Recovery Care/Counseling Consult Complete Complete  Palliative Care Screening Not Applicable Not Applicable  Skilled Nursing Facility Not Applicable Not Applicable

## 2024-03-09 NOTE — Plan of Care (Signed)
  Problem: Clinical Measurements: Goal: Will remain free from infection Outcome: Progressing Goal: Cardiovascular complication will be avoided Outcome: Progressing   Problem: Activity: Goal: Risk for activity intolerance will decrease Outcome: Progressing   Problem: Pain Managment: Goal: General experience of comfort will improve and/or be controlled Outcome: Progressing   Problem: Safety: Goal: Ability to remain free from injury will improve Outcome: Progressing   Problem: Skin Integrity: Goal: Risk for impaired skin integrity will decrease Outcome: Progressing

## 2024-03-09 NOTE — Progress Notes (Signed)
 PROGRESS NOTE    Jesus Haynes  FMW:[id number] DOB: 02/07/1976 DOA: 03/06/2024 PCP: [name], NP   Brief Narrative:  This 48 yrs old male with PMH significant for metastatic gastric adenocarcinoma recently stopped chemotherapy, mild intermittent asthma, cancer related pain and anemia of chronic disease who presented to the ED for evaluation of persistent nausea and vomiting and failure to thrive. As per friend and POA, patient has had persistent nausea and vomiting after stopping chemotherapy 6 weeks ago. Patient is on methadone  and Dilaudid  for pain however patient unable to keep any of his pills down to control his pain. Patient endorsing severe intermitted abdominal pain, fatigue, persistent nausea and inability to tolerate p.o. intake, even liquids. He has attempted to drink protein shakes but unable to keep it down. He reports he weighed 230 lbs prior to his cancer diagnosis 10 months ago and currently down to 109 lbs due to inability to maintain his nutrition. They had a follow-up with his oncologist yesterday and with shared decision making, patient was transitioned to home hospice. Family would like to keep patient at home as long as possible however he needs other forms of delivering his pain medications and antiemetics.    Palliative care evaluated and transitioned and changed his pain regimen to a Dilaudid  continuous drip.  Diet has been advanced to a soft diet and he has been placed on scheduled antiemetics.  Hospice liaison has met with the patient and are arranging for home pump.     Assessment & Plan:   Principal Problem:   Intractable nausea and vomiting Active Problems:   Cancer related pain   Severe protein-calorie malnutrition (HCC)   Malignant neoplasm of stomach (HCC)   Metastatic malignant neoplasm (HCC)   Intractable nausea and vomiting in the setting of  Metastatic gastric cancer: Cancer-related Abdominal pain: - Gastric adenocarcinoma initially diagnosed on  05/2023, started chemotherapy in January 2025 with FOLFOX plus nivolumab   - Due to progression of disease, patient switched to ramucirumab  plus paclitaxel  in July 2025 - Chemotherapy discontinued 6 weeks ago due to side effects. - Has had gradual decline and failure to thrive since then and due to his persistent nausea with inability to keep any food or medication down, he was seen by his oncologist on 9/4 and transitioned to hospice. -Per family, they will prefer to keep patient on home hospice if possible; [facility] and plan is to transition to Home with Hospice. -Palliative care consulted for symptomatic management and placed him on Continuous Dilaudid  gtt at 1 mg/hr - Home pump has been arranged with hospice. - Tentative plan was home 9/7 but it's now reported multiple family members got positive with covid; will need to re-discuss plan for home today if still felt to be in patient's best interest vs consideration again of residential hospice.   Hypokalemia: Replaced and Improved.  Will not check Labs anymore given transition to Hospice.   Normocytic Anemia:  Continue comfort care   Thrombocytopenia:  -Will not check any more labs as patient is transitioning to Home Hopsice.   Protein caloric malnutrition  Unintentional weight loss   Failure to thrive:  Estimated body mass index is 17.67 kg/m as calculated from the following:   Height as of this encounter: 5' 6 (1.676 m).   Weight as of this encounter: 49.7 kg. In the setting of metastatic disease.  Patient currently on hospice care   Hypoalbuminemia: Continue comfort care    DVT prophylaxis: SCDs Code  Status: DNR/Comfort care Family Communication: No family at bed side Disposition Plan:    Status is: Inpatient Remains inpatient appropriate because: Severity of illness.    Consultants:  Palliative care  Procedures:  None Antimicrobials: Anti-infectives (From admission, onward)    None        Subjective: Patient was seen and examined at bedside.  Overnight events noted. Patient reports that he does not want to risk himself since multiple family members has COVID. He is otherwise medically ready for discharge.  Objective: Vitals:   03/08/24 0514 03/08/24 1434 03/08/24 2127 03/09/24 0528  BP: 135/82 (!) 136/91 125/88 112/82  Pulse: (!) 56 61 (!) 58 (!) 58  Resp: 16 18 14 14   Temp: 98 F (36.7 C) 97.8 F (36.6 C) 99.1 F (37.3 C) 97.7 F (36.5 C)  TempSrc: Oral Oral Oral Oral  SpO2: 96% 99% 97% 99%  Weight:      Height:        Intake/Output Summary (Last 24 hours) at 03/09/2024 1058 Last data filed at 03/08/2024 2249 Gross per 24 hour  Intake 1315.49 ml  Output --  Net 1315.49 ml   Filed Weights   03/07/24 0044  Weight: 49.7 kg    Examination:  General exam: Appears calm and comfortable, deconditioned, not in any acute distress. Respiratory system: Clear to auscultation. Respiratory effort normal.  RR 12 Cardiovascular system: S1 & S2 heard, RRR. No JVD, murmurs, rubs, gallops or clicks.  Gastrointestinal system: Abdomen is non distended, soft and non tender.  Normal bowel sounds heard. Central nervous system: Alert and oriented x 3. No focal neurological deficits. Extremities: No edema, no cyanosis, no clubbing. Skin: No rashes, lesions or ulcers Psychiatry: Judgement and insight appear normal. Mood & affect appropriate.     Data Reviewed: I have personally reviewed following labs and imaging studies  CBC: Recent Labs  Lab 03/06/24 2205 03/06/24 2206 03/07/24 0549  WBC 6.1  --  4.3  NEUTROABS 4.9  --   --   HGB 11.8* 12.2* 10.7*  HCT 37.4* 36.0* 35.6*  MCV 85.2  --  86.0  PLT 159  --  148*   Basic Metabolic Panel: Recent Labs  Lab 03/06/24 2206 03/07/24 0549  NA 135  135 136  K 3.6  3.5 3.6  CL 100  98 101  CO2 26 25  GLUCOSE 82  80 71  BUN 12  11 11   CREATININE 0.43*  0.50* 0.41*  CALCIUM  8.7* 8.2*   GFR: Estimated Creatinine  Clearance: 80.2 mL/min (A) (by C-G formula based on SCr of 0.41 mg/dL (L)). Liver Function Tests: Recent Labs  Lab 03/06/24 2206  AST 17  ALT 8  ALKPHOS 55  BILITOT 0.4  PROT 5.2*  ALBUMIN 3.1*   No results for input(s): LIPASE, AMYLASE in the last 168 hours. No results for input(s): AMMONIA in the last 168 hours. Coagulation Profile: No results for input(s): INR, PROTIME in the last 168 hours. Cardiac Enzymes: No results for input(s): CKTOTAL, CKMB, CKMBINDEX, TROPONINI in the last 168 hours. BNP (last 3 results) No results for input(s): PROBNP in the last 8760 hours. HbA1C: No results for input(s): HGBA1C in the last 72 hours. CBG: No results for input(s): GLUCAP in the last 168 hours. Lipid Profile: No results for input(s): CHOL, HDL, LDLCALC, TRIG, CHOLHDL, LDLDIRECT in the last 72 hours. Thyroid  Function Tests: No results for input(s): TSH, T4TOTAL, FREET4, T3FREE, THYROIDAB in the last 72 hours. Anemia Panel: No results for input(s): VITAMINB12,  FOLATE, FERRITIN, TIBC, IRON , RETICCTPCT in the last 72 hours. Sepsis Labs: No results for input(s): PROCALCITON, LATICACIDVEN in the last 168 hours.  No results found for this or any previous visit (from the past 240 hours).   Radiology Studies: No results found.  Scheduled Meds:  Chlorhexidine  Gluconate Cloth  6 each Topical Daily   feeding supplement  237 mL Oral BID BM   ondansetron   4 mg Oral Q8H   Or   ondansetron  (ZOFRAN ) IV  4 mg Intravenous Q8H   sodium chloride  flush  10-40 mL Intracatheter Q12H   Continuous Infusions:  HYDROmorphone  1 mg/hr (03/08/24 2249)     LOS: 2 days    Time spent: 50 mins    Darcel Dawley, MD [facility]   If 7PM-7AM, please contact night-coverage

## 2024-03-10 ENCOUNTER — Other Ambulatory Visit (HOSPITAL_COMMUNITY): Payer: Self-pay

## 2024-03-10 DIAGNOSIS — R112 Nausea with vomiting, unspecified: Secondary | ICD-10-CM | POA: Diagnosis not present

## 2024-03-10 MED ORDER — HEPARIN SOD (PORK) LOCK FLUSH 100 UNIT/ML IV SOLN
500.0000 [IU] | Freq: Once | INTRAVENOUS | Status: AC
Start: 2024-03-10 — End: 2024-03-10
  Administered 2024-03-10: 500 [IU] via INTRAVENOUS
  Filled 2024-03-10: qty 5

## 2024-03-10 NOTE — Plan of Care (Signed)
  Problem: Activity: Goal: Risk for activity intolerance will decrease Outcome: Progressing   Problem: Nutrition: Goal: Adequate nutrition will be maintained Outcome: Progressing   Problem: Coping: Goal: Level of anxiety will decrease Outcome: Progressing   Problem: Pain Managment: Goal: General experience of comfort will improve and/or be controlled Outcome: Progressing   Problem: Safety: Goal: Ability to remain free from injury will improve Outcome: Progressing

## 2024-03-10 NOTE — Plan of Care (Signed)
   Problem: Education: Goal: Knowledge of General Education information will improve Description Including pain rating scale, medication(s)/side effects and non-pharmacologic comfort measures Outcome: Progressing

## 2024-03-10 NOTE — Progress Notes (Signed)
 The patient has met all goals per MD orders and is ready for discharge. Patient in agreement, being discharged home to self/caregiver under care of physician. Discharge summary paperwork given, explained and reviewed. Patient left with all belongings in stable conditions.

## 2024-03-10 NOTE — Progress Notes (Cosign Needed Addendum)
 13mL of Hydromorphone  (Dilaudid ) discarded and witnessed by Karna Jacobsen. 1058: Patient's chest port accessed d/t home hospice pain management pump. Blood return noted, dressing is clean, dry, intact.

## 2024-03-10 NOTE — Discharge Summary (Signed)
 Physician Discharge Summary  Jesus Haynes FMW:[id number] DOB: 10/15/1975 DOA: 03/06/2024  PCP: [name], NP  Admit date: 03/06/2024  Discharge date: 03/10/2024  Admitted From: Home  Disposition:  Home  Recommendations for Outpatient Follow-up:  Follow up with PCP in 1-2 weeks. Please obtain BMP/CBC in one week. Advised to continue Dilaudid  PCA for pain control. Hospice will adjust his dosing.  Will quarantine at home.  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 As per friend and POA, patient has had persistent nausea and vomiting after stopping chemotherapy 6 weeks ago. Patient is on methadone  and Dilaudid  for pain however patient unable to keep any of his pills down to control his pain. Patient endorsing severe intermitted abdominal pain, fatigue, persistent nausea and inability to tolerate p.o. intake, even liquids. He has attempted to drink protein shakes but unable to keep it down. He reports he weighed 230 lbs prior to his cancer diagnosis 10 months ago and currently down to 109 lbs due to inability to maintain his nutrition. They had a follow-up with his oncologist yesterday and with shared decision making, patient was transitioned to home hospice. Family would like to keep patient at home as long as possible however he needs other forms of delivering his pain medications and antiemetics. Palliative care evaluated and transitioned and changed his pain regimen to a Dilaudid  continuous drip.  Diet has been advanced to a soft diet and he has been placed on scheduled antiemetics.  Hospice liaison  has met with the patient and are arranging for home pump.  Patient feels better and wants to be discharged home.  Patient's family members were diagnosed with COVID.  Patient states he will quarantine himself at home.  Patient is being discharged home with PCA Dilaudid .  Hospice will adjust his dosing at home.  Discharge Diagnoses:  Principal Problem:   Intractable nausea and vomiting Active Problems:   Cancer related pain   Severe protein-calorie malnutrition (HCC)   Malignant neoplasm of stomach (HCC)   Metastatic malignant neoplasm [facility])  Discharge Instructions  Discharge Instructions     (HEART FAILURE PATIENTS) Call MD:  Anytime you have any of the following symptoms: 1) 3 pound weight gain in 24 hours or 5 pounds in 1 week 2) shortness of breath, with or without a dry hacking cough 3) swelling in the hands, feet or stomach 4) if you have to sleep on extra pillows at night in order to breathe.   Complete by: As directed    Call MD for:  difficulty breathing, headache or visual disturbances   Complete by: As directed    Call MD for:  difficulty breathing, headache or visual disturbances   Complete by: As directed    Call MD for:  extreme fatigue   Complete by: As directed    Call MD for:  hives   Complete by: As directed    Call MD for:  persistant dizziness or light-headedness   Complete by: As directed    Call MD for:  persistant dizziness or light-headedness   Complete by: As directed    Call MD for:  persistant nausea and vomiting   Complete by: As directed  Call MD for:  persistant nausea and vomiting   Complete by: As directed    Call MD for:  redness, tenderness, or signs of infection (pain, swelling, redness, odor or green/yellow discharge around incision site)   Complete by: As directed    Call MD for:  severe uncontrolled pain   Complete by: As directed    Call MD for:  temperature >100.4   Complete by: As directed    Diet - low sodium heart healthy   Complete  by: As directed    Diet general   Complete by: As directed    Diet general   Complete by: As directed    Diet general   Complete by: As directed    Discharge instructions   Complete by: As directed    You were cared for by a hospitalist during your hospital stay. If you have any questions about your discharge medications or the care you received while you were in the hospital after you are discharged, you can call the unit and ask to speak with the hospitalist on call if the hospitalist that took care of you is not available. Once you are discharged, your primary care physician will handle any further medical issues. Please note that NO REFILLS for any discharge medications will be authorized once you are discharged, as it is imperative that you return to your primary care physician (or establish a relationship with a primary care physician if you do not have one) for your aftercare needs so that they can reassess your need for medications and monitor your lab values.  Follow up care per Hospice Protocol. Take all medications as prescribed. If symptoms change or worsen please return to the ED for evaluation   Discharge instructions   Complete by: As directed    Advised to follow up PCP in one week. Advised to continue Dilaudid  PCA for pain control. Hospice will adjust his dosing.  Will quarantine at home.   Increase activity slowly   Complete by: As directed    Increase activity slowly   Complete by: As directed       Allergies as of 03/10/2024   No Known Allergies      Medication List     STOP taking these medications    HYDROmorphone  2 MG tablet Commonly known as: Dilaudid    methadone  5 MG tablet Commonly known as: DOLOPHINE    ondansetron  4 MG disintegrating tablet Commonly known as: ZOFRAN -ODT       TAKE these medications    albuterol  (2.5 MG/3ML) 0.083% nebulizer solution Commonly known as: PROVENTIL  Take 3 mLs (2.5 mg total) by nebulization every 6 (six) hours as  needed for wheezing or shortness of breath.   Ventolin  HFA 108 (90 Base) MCG/ACT inhaler Generic drug: albuterol  Inhale 1-2 puffs into the lungs every 4 (four) hours as needed for wheezing or shortness of breath.   B-12 1000 MCG Tabs Take 1 tablet (1,000 mcg total) by mouth daily.   bisacodyl  5 MG EC tablet Generic drug: bisacodyl  Take 1 tablet (5 mg total) by mouth daily as needed for moderate constipation.   dexamethasone  2 MG tablet Commonly known as: DECADRON  Take 2 tablets by mouth twice daily for 3 days, then take 2 tablets by mouth once a day for 3 days, then take 1 tablet by mouth once a day for 2 days.   docusate sodium  100 MG capsule Commonly known as: COLACE Take 1 capsule (100 mg total) by mouth daily.   DULoxetine  20  MG capsule Commonly known as: CYMBALTA  Take 1 capsule (20 mg total) by mouth daily.   feeding supplement Liqd Take 237 mLs by mouth 2 (two) times daily between meals.   folic acid  1 MG tablet Commonly known as: FOLVITE  Take 1 tablet (1 mg total) by mouth daily.   HYDROmorphone  HCl-NaCl 50MG ANTONIA Soln Commonly known as: DILAUDID  Inject 1 mg/hr into the vein continuous.   Normal Saline Flush 0.9 % Soln Use 10-40 mLs by Intracatheter route as needed (flush).   ondansetron  4 MG tablet Commonly known as: ZOFRAN  Take 1 tablet (4 mg total) by mouth every 8 (eight) hours.   pantoprazole  40 MG tablet Commonly known as: Protonix  Take 1 tablet (40 mg total) by mouth 2 (two) times daily.   potassium chloride  10 MEQ tablet Commonly known as: KLOR-CON  Take 1 tablet (10 mEq total) by mouth daily   prochlorperazine  10 MG tablet Commonly known as: COMPAZINE  Take 0.5 tablets (5 mg total) by mouth every 6 (six) hours as needed for refractory nausea / vomiting.   prochlorperazine  25 MG suppository Commonly known as: COMPAZINE  Place 1 suppository (25 mg total) rectally every 12 (twelve) hours as needed for refractory nausea / vomiting.   scopolamine  1  MG/3DAYS Commonly known as: TRANSDERM-SCOP Place 1 patch (1 mg total) onto the skin behind ear every 3 (three) days.   Stool Softener/Laxative 50-8.6 MG tablet Generic drug: senna-docusate Take 1 tablet by mouth at bedtime as needed for mild constipation.   temazepam  15 MG capsule Commonly known as: RESTORIL  Take 1 capsule (15 mg total) by mouth at bedtime as needed for sleep.   thiamine  100 MG tablet Commonly known as: VITAMIN B1 Take 1 tablet (100 mg total) by mouth daily.        Follow-up Information     [facility] Follow up.   Contact information: [address] [place] [address] [phone]                No Known Allergies  Consultations: Palliative care   Procedures/Studies: DG Chest Portable 1 View Result Date: 02/21/2024 CLINICAL DATA:  chest pain Hx Gastric adenocarcinoma , last chemo two weeks ago EXAM: PORTABLE CHEST 1 VIEW COMPARISON:  Chest x-ray 02/02/2024, PET CT 02/03/2024, CT chest 02/03/2024 FINDINGS: Right chest wall Port-A-Cath with tip overlying the expected region of the superior caval junction. The heart and mediastinal contours are unchanged. Persistent prominent hilar regions consistent with known underlying lymphadenopathy. No focal consolidation. No pulmonary edema. No pleural effusion. No pneumothorax. No acute osseous abnormality. IMPRESSION: 1. No acute cardiopulmonary abnormality. 2. Persistent prominent hilar regions consistent with known underlying lymphadenopathy. Finding better evaluated on PET CT 12/30/2023 and CT angio chest 02/03/2024. Electronically Signed   By: Morgane  Naveau M.D.   On: 02/21/2024 22:32   CT ABDOMEN PELVIS W CONTRAST Result Date: 02/20/2024 CLINICAL DATA:  Concern for bowel obstruction. History of adenocarcinoma of the GE junction. EXAM: CT ABDOMEN AND PELVIS WITH CONTRAST TECHNIQUE: Multidetector CT imaging of the abdomen and pelvis was performed using the standard protocol following bolus  administration of intravenous contrast. RADIATION DOSE REDUCTION: This exam was performed according to the departmental dose-optimization program which includes automated exposure control, adjustment of the mA and/or kV according to patient size and/or use of iterative reconstruction technique. CONTRAST:  OMNIPAQUE  IOHEXOL  300 MG/ML  SOLN COMPARISON:  CT abdomen pelvis dated 02/03/2024. FINDINGS: Lower chest: Small bilateral pleural effusions. There is partial compressive atelectasis of the left lung base.  No intra-abdominal free air. There is diffuse mesenteric edema and small ascites. Hepatobiliary: Small liver cysts. No biliary ductal dilatation. The gallbladder is unremarkable. Pancreas: The pancreas is atrophic for patient's age which may be sequela of chronic pancreatitis. Correlation with pancreatic enzymes recommended to exclude the possibility of acute on chronic pancreatitis. No dilatation of the main pancreatic duct. Spleen: Normal in size without focal abnormality. Adrenals/Urinary Tract: The adrenal glands unremarkable. There is no hydronephrosis on either side. The urinary bladder is grossly unremarkable. Stomach/Bowel: There is diffuse thickening of the gastric wall which may be related to ascites or represent gastritis. There is no bowel obstruction. The appendix is normal. Vascular/Lymphatic: The abdominal aorta and IVC unremarkable. No portal venous gas. Retroperitoneal adenopathy measure up to 16 mm in short axis to the left of the aorta at the level of the renal vasculature concerning for metastatic adenopathy. The left para-aortic adenopathy demonstrate central low attenuation which may represent necrotic changes. These are relatively similar in size to the prior CT. Reproductive: The prostate and seminal vesicles are grossly unremarkable. Other: Diffuse subcutaneous edema and anasarca. Musculoskeletal: No acute osseous pathology. IMPRESSION: 1. No bowel obstruction. Normal appendix. 2.  Diffuse mesenteric edema, anasarca, and small ascites. 3. Small bilateral pleural effusions. 4. Retroperitoneal adenopathy consistent with metastatic disease. 5. Diffuse thickening of the gastric wall may be related to ascites or represent gastritis. An infiltrative process is not excluded. 6. Atrophic pancreas for patient's age and possibly secondary to chronic pancreatitis. Correlation with pancreatic enzymes recommended to exclude the possibility of acute on chronic pancreatitis. Electronically Signed   By: Vanetta Chou M.D.   On: 02/20/2024 18:21     Subjective: Patient was seen and examined at bedside.  Overnight events noted. Patient reports feeling much better and wants to be discharged. Patient being discharged on Dilaudid  PCA,  will be followed up by hospice.  Discharge Exam: Vitals:   03/09/24 2128 03/10/24 0532  BP: (!) 127/94 110/83  Pulse: 80 68  Resp: 14 14  Temp: 97.9 F (36.6 C) 97.6 F (36.4 C)  SpO2: 100% 100%   Vitals:   03/09/24 0528 03/09/24 1421 03/09/24 2128 03/10/24 0532  BP: 112/82 124/75 (!) 127/94 110/83  Pulse: (!) 58 61 80 68  Resp: 14  14 14   Temp: 97.7 F (36.5 C) 97.7 F (36.5 C) 97.9 F (36.6 C) 97.6 F (36.4 C)  TempSrc: Oral Oral Oral Oral  SpO2: 99% 98% 100% 100%  Weight:      Height:        General: Pt is alert, awake, not in acute distress Cardiovascular: RRR, S1/S2 +, no rubs, no gallops Respiratory: CTA bilaterally, no wheezing, no rhonchi Abdominal: Soft, NT, ND, bowel sounds + Extremities: no edema, no cyanosis    The results of significant diagnostics from this hospitalization (including imaging, microbiology, ancillary and laboratory) are listed below for reference.     Microbiology: No results found for this or any previous visit (from the past 240 hours).   Labs: BNP (last 3 results) No results for input(s): BNP in the last 8760 hours. Basic Metabolic Panel: Recent Labs  Lab 03/06/24 2206 03/07/24 0549  NA  135  135 136  K 3.6  3.5 3.6  CL 100  98 101  CO2 26 25  GLUCOSE 82  80 71  BUN 12  11 11   CREATININE 0.43*  0.50* 0.41*  CALCIUM  8.7* 8.2*   Liver Function Tests: Recent Labs  Lab 03/06/24 2206  AST  17  ALT 8  ALKPHOS 55  BILITOT 0.4  PROT 5.2*  ALBUMIN 3.1*   No results for input(s): LIPASE, AMYLASE in the last 168 hours. No results for input(s): AMMONIA in the last 168 hours. CBC: Recent Labs  Lab 03/06/24 2205 03/06/24 2206 03/07/24 0549  WBC 6.1  --  4.3  NEUTROABS 4.9  --   --   HGB 11.8* 12.2* 10.7*  HCT 37.4* 36.0* 35.6*  MCV 85.2  --  86.0  PLT 159  --  148*   Cardiac Enzymes: No results for input(s): CKTOTAL, CKMB, CKMBINDEX, TROPONINI in the last 168 hours. BNP: Invalid input(s): POCBNP CBG: No results for input(s): GLUCAP in the last 168 hours. D-Dimer No results for input(s): DDIMER in the last 72 hours. Hgb A1c No results for input(s): HGBA1C in the last 72 hours. Lipid Profile No results for input(s): CHOL, HDL, LDLCALC, TRIG, CHOLHDL, LDLDIRECT in the last 72 hours. Thyroid  function studies No results for input(s): TSH, T4TOTAL, T3FREE, THYROIDAB in the last 72 hours.  Invalid input(s): FREET3 Anemia work up No results for input(s): VITAMINB12, FOLATE, FERRITIN, TIBC, IRON , RETICCTPCT in the last 72 hours. Urinalysis    Component Value Date/Time   COLORURINE STRAW (A) 10/03/2023 1710   APPEARANCEUR CLEAR 10/03/2023 1710   LABSPEC 1.003 (L) 10/03/2023 1710   PHURINE 6.0 10/03/2023 1710   GLUCOSEU NEGATIVE 10/03/2023 1710   HGBUR NEGATIVE 10/03/2023 1710   BILIRUBINUR NEGATIVE 10/03/2023 1710   KETONESUR NEGATIVE 10/03/2023 1710   PROTEINUR NEGATIVE 01/15/2024 0840   NITRITE NEGATIVE 10/03/2023 1710   LEUKOCYTESUR NEGATIVE 10/03/2023 1710   Sepsis Labs Recent Labs  Lab 03/06/24 2205 03/07/24 0549  WBC 6.1 4.3   Microbiology No results found for this or any previous  visit (from the past 240 hours).   Time coordinating discharge: Over 30 minutes  SIGNED:   Darcel Dawley, MD  [facility] 03/10/2024, 3:01 PM Pager   If 7PM-7AM, please contact night-coverage [email or web address] Password [email or web address]

## 2024-03-10 NOTE — Progress Notes (Signed)
 Discharge meds in a secure bag delivered to room by this RN

## 2024-03-10 NOTE — Plan of Care (Signed)
  Daily Progress Note   Patient Name: Jesus Haynes       Date: 03/10/2024 DOB: 12/23/1975  Age: 48 y.o. MRN#: [id number] Attending Physician: No att. providers found Primary Care Physician: [name], NP Admit Date: 03/06/2024 Length of Stay: 3 days  Discussed care with primary hospitalist, TOC, and RN today. Patient being discharged today with hospice support at home via [facility]. Patient going home on PCA and with other medications required for symptom management. Thank you for involving PMT in patient's care.   Tinnie Radar, DO Palliative Care Provider PMT # [phone]  No Charge Note

## 2024-03-10 NOTE — Plan of Care (Signed)
  Problem: Education: Goal: Knowledge of General Education information will improve Description: Including pain rating scale, medication(s)/side effects and non-pharmacologic comfort measures 03/10/2024 1106 by Morna Aquas, RN Outcome: Adequate for Discharge 03/10/2024 1105 by Morna Aquas, RN Outcome: Progressing   Problem: Health Behavior/Discharge Planning: Goal: Ability to manage health-related needs will improve Outcome: Adequate for Discharge   Problem: Clinical Measurements: Goal: Ability to maintain clinical measurements within normal limits will improve Outcome: Adequate for Discharge Goal: Will remain free from infection Outcome: Adequate for Discharge Goal: Diagnostic test results will improve Outcome: Adequate for Discharge Goal: Respiratory complications will improve Outcome: Adequate for Discharge Goal: Cardiovascular complication will be avoided Outcome: Adequate for Discharge   Problem: Activity: Goal: Risk for activity intolerance will decrease Outcome: Adequate for Discharge   Problem: Nutrition: Goal: Adequate nutrition will be maintained Outcome: Adequate for Discharge   Problem: Coping: Goal: Level of anxiety will decrease Outcome: Adequate for Discharge   Problem: Elimination: Goal: Will not experience complications related to bowel motility Outcome: Adequate for Discharge Goal: Will not experience complications related to urinary retention Outcome: Adequate for Discharge   Problem: Pain Managment: Goal: General experience of comfort will improve and/or be controlled Outcome: Adequate for Discharge   Problem: Safety: Goal: Ability to remain free from injury will improve Outcome: Adequate for Discharge   Problem: Skin Integrity: Goal: Risk for impaired skin integrity will decrease Outcome: Adequate for Discharge

## 2024-03-11 ENCOUNTER — Other Ambulatory Visit

## 2024-03-11 ENCOUNTER — Encounter

## 2024-03-11 ENCOUNTER — Ambulatory Visit

## 2024-03-11 ENCOUNTER — Encounter: Admitting: Dietician

## 2024-03-11 ENCOUNTER — Ambulatory Visit: Admitting: Oncology

## 2024-03-17 ENCOUNTER — Other Ambulatory Visit: Payer: Self-pay | Admitting: Nurse Practitioner

## 2024-03-17 MED ORDER — POTASSIUM CHLORIDE ER 10 MEQ PO TBCR: 10.0000 meq | EXTENDED_RELEASE_TABLET | Freq: Every day | ORAL | 0 refills | Status: AC

## 2024-03-17 NOTE — Telephone Encounter (Signed)
 Please advise [facility] Due to pt being on Hospice

## 2024-03-18 ENCOUNTER — Other Ambulatory Visit (HOSPITAL_COMMUNITY): Payer: Self-pay

## 2024-04-01 ENCOUNTER — Inpatient Hospital Stay: Admitting: Nurse Practitioner

## 2024-04-06 ENCOUNTER — Encounter: Payer: Self-pay | Admitting: Oncology

## 2024-05-02 DEATH — deceased

## 2024-05-11 ENCOUNTER — Other Ambulatory Visit (HOSPITAL_COMMUNITY): Payer: Self-pay

## 2024-06-19 ENCOUNTER — Other Ambulatory Visit: Payer: Self-pay | Admitting: Oncology

## 2024-07-21 ENCOUNTER — Other Ambulatory Visit (HOSPITAL_COMMUNITY): Payer: Self-pay

## 2024-07-28 ENCOUNTER — Other Ambulatory Visit (HOSPITAL_COMMUNITY): Payer: Self-pay

## 2024-08-07 ENCOUNTER — Other Ambulatory Visit: Payer: Self-pay | Admitting: Nurse Practitioner
